# Patient Record
Sex: Male | Born: 1945 | Race: White | Hispanic: No | Marital: Married | Smoking: Former smoker
Health system: Southern US, Community
[De-identification: ages and names within clinical notes are randomized; demographics above are authoritative.]

## PROBLEM LIST (undated history)

## (undated) DIAGNOSIS — I4891 Unspecified atrial fibrillation: Secondary | ICD-10-CM

## (undated) DIAGNOSIS — N200 Calculus of kidney: Secondary | ICD-10-CM

## (undated) DIAGNOSIS — K635 Polyp of colon: Secondary | ICD-10-CM

## (undated) DIAGNOSIS — I10 Essential (primary) hypertension: Secondary | ICD-10-CM

## (undated) DIAGNOSIS — K5790 Diverticulosis of intestine, part unspecified, without perforation or abscess without bleeding: Secondary | ICD-10-CM

## (undated) DIAGNOSIS — E119 Type 2 diabetes mellitus without complications: Secondary | ICD-10-CM

## (undated) DIAGNOSIS — F419 Anxiety disorder, unspecified: Secondary | ICD-10-CM

## (undated) DIAGNOSIS — C679 Malignant neoplasm of bladder, unspecified: Secondary | ICD-10-CM

## (undated) DIAGNOSIS — D638 Anemia in other chronic diseases classified elsewhere: Secondary | ICD-10-CM

## (undated) DIAGNOSIS — I5022 Chronic systolic (congestive) heart failure: Secondary | ICD-10-CM

## (undated) DIAGNOSIS — I639 Cerebral infarction, unspecified: Secondary | ICD-10-CM

## (undated) DIAGNOSIS — M1990 Unspecified osteoarthritis, unspecified site: Secondary | ICD-10-CM

## (undated) DIAGNOSIS — G4733 Obstructive sleep apnea (adult) (pediatric): Secondary | ICD-10-CM

## (undated) DIAGNOSIS — I700 Atherosclerosis of aorta: Secondary | ICD-10-CM

## (undated) DIAGNOSIS — D494 Neoplasm of unspecified behavior of bladder: Secondary | ICD-10-CM

## (undated) DIAGNOSIS — E118 Type 2 diabetes mellitus with unspecified complications: Secondary | ICD-10-CM

## (undated) DIAGNOSIS — R911 Solitary pulmonary nodule: Secondary | ICD-10-CM

## (undated) DIAGNOSIS — J449 Chronic obstructive pulmonary disease, unspecified: Secondary | ICD-10-CM

## (undated) DIAGNOSIS — I6930 Unspecified sequelae of cerebral infarction: Secondary | ICD-10-CM

## (undated) DIAGNOSIS — C4491 Basal cell carcinoma of skin, unspecified: Secondary | ICD-10-CM

## (undated) DIAGNOSIS — M21371 Foot drop, right foot: Secondary | ICD-10-CM

## (undated) DIAGNOSIS — I776 Arteritis, unspecified: Secondary | ICD-10-CM

## (undated) DIAGNOSIS — J479 Bronchiectasis, uncomplicated: Secondary | ICD-10-CM

## (undated) DIAGNOSIS — K219 Gastro-esophageal reflux disease without esophagitis: Secondary | ICD-10-CM

## (undated) DIAGNOSIS — L959 Vasculitis limited to the skin, unspecified: Secondary | ICD-10-CM

## (undated) DIAGNOSIS — Z8701 Personal history of pneumonia (recurrent): Secondary | ICD-10-CM

## (undated) HISTORY — DX: Bronchiectasis, uncomplicated: J47.9

## (undated) HISTORY — DX: Basal cell carcinoma of skin, unspecified: C44.91

## (undated) HISTORY — DX: Chronic obstructive pulmonary disease, unspecified: J44.9

## (undated) HISTORY — DX: Vasculitis limited to the skin, unspecified: L95.9

## (undated) HISTORY — DX: Diverticulosis of intestine, part unspecified, without perforation or abscess without bleeding: K57.90

## (undated) HISTORY — DX: Essential (primary) hypertension: I10

## (undated) HISTORY — DX: Foot drop, right foot: M21.371

## (undated) HISTORY — DX: Unspecified sequelae of cerebral infarction: I69.30

## (undated) HISTORY — DX: Anemia in other chronic diseases classified elsewhere: D63.8

## (undated) HISTORY — DX: Obstructive sleep apnea (adult) (pediatric): G47.33

## (undated) HISTORY — PX: TRANSTHORACIC ECHOCARDIOGRAM: SHX275

## (undated) HISTORY — DX: Arteritis, unspecified: I77.6

## (undated) HISTORY — DX: Atherosclerosis of aorta: I70.0

## (undated) HISTORY — DX: Type 2 diabetes mellitus with unspecified complications: E11.8

## (undated) HISTORY — DX: Polyp of colon: K63.5

## (undated) HISTORY — DX: Type 2 diabetes mellitus without complications: E11.9

## (undated) HISTORY — DX: Unspecified osteoarthritis, unspecified site: M19.90

## (undated) HISTORY — DX: Solitary pulmonary nodule: R91.1

## (undated) HISTORY — DX: Calculus of kidney: N20.0

## (undated) HISTORY — DX: Gastro-esophageal reflux disease without esophagitis: K21.9

## (undated) HISTORY — DX: Malignant neoplasm of bladder, unspecified: C67.9

## (undated) HISTORY — PX: COLONOSCOPY: SHX174

## (undated) HISTORY — PX: SKIN CANCER EXCISION: SHX779

## (undated) HISTORY — DX: Cerebral infarction, unspecified: I63.9

## (undated) HISTORY — DX: Personal history of pneumonia (recurrent): Z87.01

---

## 1994-04-04 ENCOUNTER — Encounter: Payer: Self-pay | Admitting: Pulmonary Disease

## 1994-05-04 ENCOUNTER — Encounter: Payer: Self-pay | Admitting: Pulmonary Disease

## 1994-07-10 HISTORY — PX: TRANSURETHRAL RESECTION OF BLADDER TUMOR: SHX2575

## 1994-07-10 HISTORY — PX: OTHER SURGICAL HISTORY: SHX169

## 2000-09-10 ENCOUNTER — Encounter: Payer: Self-pay | Admitting: Pulmonary Disease

## 2007-09-13 ENCOUNTER — Ambulatory Visit: Payer: Self-pay | Admitting: Pulmonary Disease

## 2007-09-13 ENCOUNTER — Encounter: Payer: Self-pay | Admitting: Pulmonary Disease

## 2007-09-13 DIAGNOSIS — F17200 Nicotine dependence, unspecified, uncomplicated: Secondary | ICD-10-CM | POA: Insufficient documentation

## 2007-09-13 DIAGNOSIS — J309 Allergic rhinitis, unspecified: Secondary | ICD-10-CM | POA: Insufficient documentation

## 2007-09-13 DIAGNOSIS — J441 Chronic obstructive pulmonary disease with (acute) exacerbation: Secondary | ICD-10-CM | POA: Insufficient documentation

## 2007-09-13 DIAGNOSIS — I10 Essential (primary) hypertension: Secondary | ICD-10-CM | POA: Insufficient documentation

## 2007-09-13 DIAGNOSIS — J438 Other emphysema: Secondary | ICD-10-CM | POA: Insufficient documentation

## 2007-09-16 ENCOUNTER — Encounter: Payer: Self-pay | Admitting: Pulmonary Disease

## 2007-10-22 ENCOUNTER — Encounter: Payer: Self-pay | Admitting: Pulmonary Disease

## 2008-01-22 ENCOUNTER — Encounter: Payer: Self-pay | Admitting: Pulmonary Disease

## 2008-03-31 ENCOUNTER — Ambulatory Visit: Payer: Self-pay | Admitting: Pulmonary Disease

## 2008-03-31 DIAGNOSIS — J479 Bronchiectasis, uncomplicated: Secondary | ICD-10-CM | POA: Insufficient documentation

## 2014-02-10 ENCOUNTER — Telehealth: Payer: Self-pay | Admitting: *Deleted

## 2014-02-10 NOTE — Telephone Encounter (Signed)
Erroneous encounter opening

## 2014-07-30 ENCOUNTER — Emergency Department (HOSPITAL_COMMUNITY)
Admission: EM | Admit: 2014-07-30 | Discharge: 2014-07-30 | Disposition: A | Discharge status: Home / Self Care | Payer: Medicare Other | Attending: Emergency Medicine | Admitting: Emergency Medicine

## 2014-07-30 ENCOUNTER — Encounter (HOSPITAL_COMMUNITY): Payer: Self-pay | Admitting: *Deleted

## 2014-07-30 ENCOUNTER — Emergency Department (HOSPITAL_COMMUNITY): Payer: Medicare Other

## 2014-07-30 DIAGNOSIS — R05 Cough: Secondary | ICD-10-CM

## 2014-07-30 DIAGNOSIS — Z86018 Personal history of other benign neoplasm: Secondary | ICD-10-CM | POA: Diagnosis not present

## 2014-07-30 DIAGNOSIS — J441 Chronic obstructive pulmonary disease with (acute) exacerbation: Secondary | ICD-10-CM | POA: Insufficient documentation

## 2014-07-30 DIAGNOSIS — Z87891 Personal history of nicotine dependence: Secondary | ICD-10-CM | POA: Insufficient documentation

## 2014-07-30 DIAGNOSIS — I480 Paroxysmal atrial fibrillation: Secondary | ICD-10-CM | POA: Insufficient documentation

## 2014-07-30 DIAGNOSIS — Z91040 Latex allergy status: Secondary | ICD-10-CM | POA: Insufficient documentation

## 2014-07-30 DIAGNOSIS — Z87442 Personal history of urinary calculi: Secondary | ICD-10-CM | POA: Diagnosis not present

## 2014-07-30 DIAGNOSIS — R059 Cough, unspecified: Secondary | ICD-10-CM

## 2014-07-30 HISTORY — DX: Neoplasm of unspecified behavior of bladder: D49.4

## 2014-07-30 HISTORY — DX: Unspecified atrial fibrillation: I48.91

## 2014-07-30 LAB — COMPREHENSIVE METABOLIC PANEL
ALT: 23 U/L (ref 0–53)
AST: 26 U/L (ref 0–37)
Albumin: 3.6 g/dL (ref 3.5–5.2)
Alkaline Phosphatase: 61 U/L (ref 39–117)
Anion gap: 9 (ref 5–15)
BUN: 15 mg/dL (ref 6–23)
CO2: 27 mmol/L (ref 19–32)
Calcium: 8.8 mg/dL (ref 8.4–10.5)
Chloride: 99 mEq/L (ref 96–112)
Creatinine, Ser: 1.08 mg/dL (ref 0.50–1.35)
GFR calc Af Amer: 78 mL/min — ABNORMAL LOW (ref >90)
GFR calc non Af Amer: 68 mL/min — ABNORMAL LOW (ref >90)
Glucose, Bld: 119 mg/dL — ABNORMAL HIGH (ref 70–99)
Potassium: 3.5 mmol/L (ref 3.5–5.1)
Sodium: 135 mmol/L (ref 135–145)
Total Bilirubin: 0.9 mg/dL (ref 0.3–1.2)
Total Protein: 7.8 g/dL (ref 6.0–8.3)

## 2014-07-30 LAB — CBC WITH DIFFERENTIAL/PLATELET
Basophils Absolute: 0 10*3/uL (ref 0.0–0.1)
Basophils Relative: 0 % (ref 0–1)
Eosinophils Absolute: 0.1 10*3/uL (ref 0.0–0.7)
Eosinophils Relative: 1 % (ref 0–5)
HCT: 43.2 % (ref 39.0–52.0)
Hemoglobin: 14.1 g/dL (ref 13.0–17.0)
Lymphocytes Relative: 11 % — ABNORMAL LOW (ref 12–46)
Lymphs Abs: 1.3 10*3/uL (ref 0.7–4.0)
MCH: 30.5 pg (ref 26.0–34.0)
MCHC: 32.6 g/dL (ref 30.0–36.0)
MCV: 93.5 fL (ref 78.0–100.0)
Monocytes Absolute: 1.4 10*3/uL — ABNORMAL HIGH (ref 0.1–1.0)
Monocytes Relative: 12 % (ref 3–12)
Neutro Abs: 8.9 10*3/uL — ABNORMAL HIGH (ref 1.7–7.7)
Neutrophils Relative %: 76 % (ref 43–77)
Platelets: 253 10*3/uL (ref 150–400)
RBC: 4.62 MIL/uL (ref 4.22–5.81)
RDW: 13.2 % (ref 11.5–15.5)
WBC: 11.7 10*3/uL — ABNORMAL HIGH (ref 4.0–10.5)

## 2014-07-30 MED ORDER — IPRATROPIUM-ALBUTEROL 0.5-2.5 (3) MG/3ML IN SOLN
3.0000 mL | Freq: Once | RESPIRATORY_TRACT | Status: AC
  Administered 2014-07-30: 3 mL via RESPIRATORY_TRACT
  Filled 2014-07-30: qty 3

## 2014-07-30 MED ORDER — PREDNISONE 50 MG PO TABS
60.0000 mg | ORAL_TABLET | Freq: Once | ORAL | Status: AC
  Administered 2014-07-30: 60 mg via ORAL
  Filled 2014-07-30 (×2): qty 1

## 2014-07-30 MED ORDER — LEVOFLOXACIN 500 MG PO TABS
500.0000 mg | ORAL_TABLET | Freq: Once | ORAL | Status: AC
  Administered 2014-07-30: 500 mg via ORAL
  Filled 2014-07-30: qty 1

## 2014-07-30 MED ORDER — PREDNISONE 20 MG PO TABS: 40.0000 mg | ORAL_TABLET | Freq: Every day | ORAL | Status: DC

## 2014-07-30 MED ORDER — ALBUTEROL SULFATE (2.5 MG/3ML) 0.083% IN NEBU
2.5000 mg | INHALATION_SOLUTION | Freq: Once | RESPIRATORY_TRACT | Status: AC
  Administered 2014-07-30: 2.5 mg via RESPIRATORY_TRACT
  Filled 2014-07-30: qty 3

## 2014-07-30 MED ORDER — LEVOFLOXACIN 500 MG PO TABS: 500.0000 mg | ORAL_TABLET | Freq: Every day | ORAL | Status: DC

## 2014-07-30 MED ORDER — LEVALBUTEROL HCL 0.63 MG/3ML IN NEBU
0.6300 mg | INHALATION_SOLUTION | Freq: Once | RESPIRATORY_TRACT | Status: AC
  Administered 2014-07-30: 0.63 mg via RESPIRATORY_TRACT
  Filled 2014-07-30: qty 3

## 2014-07-30 NOTE — ED Provider Notes (Signed)
CSN: [id number]     Arrival date & time 07/30/14  1414 History   First MD Initiated Contact with Patient 07/30/14 1419     Chief Complaint  Patient presents with  . Cough     Patient is a 69 y.o. male presenting with cough. The history is provided by the patient.  Cough Severity:  Moderate Onset quality:  Gradual Duration:  4 days Timing:  Intermittent Progression:  Worsening Chronicity:  New Relieved by:  Nothing Worsened by:  Nothing tried Associated symptoms: shortness of breath and wheezing   Associated symptoms: no chest pain and no fever   Patient reports cough/congestion/SOB for past 4 days He reports h/o COPD that is well controlled He denies hemoptysis No CP is reported   H/o atrial fibrillation - only takes ASA  Past Medical History  Diagnosis Date  . Atrial fibrillation   . Bladder tumor   . Kidney stone    History reviewed. No pertinent past surgical history. History reviewed. No pertinent family history. History  Substance Use Topics  . Smoking status: Former Research scientist (life sciences)  . Smokeless tobacco: Not on file  . Alcohol Use: No    Review of Systems  Constitutional: Negative for fever.  Respiratory: Positive for cough, shortness of breath and wheezing.   Cardiovascular: Negative for chest pain and leg swelling.  Gastrointestinal: Negative for vomiting.  All other systems reviewed and are negative.     Allergies  Augmentin; Ciprofloxacin; Corticosteroids; and Latex  Home Medications   Prior to Admission medications   Not on File   BP 156/88 mmHg  Pulse 99  Temp(Src) 98.1 F (36.7 C) (Oral)  Resp 28  Ht 5' 11.5" (1.816 m)  Wt 186 lb (84.369 kg)  BMI 25.58 kg/m2  SpO2 95% Physical Exam CONSTITUTIONAL: Well developed/well nourished HEAD: Normocephalic/atraumatic EYES: EOMI/PERRL, scleral icterus (pt reports chronic) ENMT: Mucous membranes moist NECK: supple no meningeal signs SPINE/BACK:entire spine nontender CV: S1/S2 noted, no  murmurs/rubs/gallops noted LUNGS: wheezing bilaterally ABDOMEN: soft, nontender, no rebound or guarding, bowel sounds noted throughout abdomen GU:no cva tenderness NEURO: Pt is awake/alert/appropriate, moves all extremitiesx4.   EXTREMITIES: pulses normal/equal, full ROM SKIN: warm, color normal PSYCH: no abnormalities of mood noted, alert and oriented to situation  ED Course  Procedures   4:16 PM Pt still with wheeze and now has worsening atrial fibrillation Will give a round of xopenex Signed out to dr Wilson Singer - pt will need reassessment after nebs.  If improved and is discharged would benefit from prednisone as well antibiotic (azithromycin due to other allergies) Labs Review Labs Reviewed  CBC WITH DIFFERENTIAL - Abnormal; Notable for the following:    WBC 11.7 (*)    Neutro Abs 8.9 (*)    Lymphocytes Relative 11 (*)    Monocytes Absolute 1.4 (*)    All other components within normal limits  COMPREHENSIVE METABOLIC PANEL - Abnormal; Notable for the following:    Glucose, Bld 119 (*)    GFR calc non Af Amer 68 (*)    GFR calc Af Amer 78 (*)    All other components within normal limits    Imaging Review Dg Chest Portable 1 View  07/30/2014   CLINICAL DATA:  Productive cough for 10 days.  Shortness of breath.  EXAM: PORTABLE CHEST - 1 VIEW  COMPARISON:  PA and lateral chest 06/18/2011.  FINDINGS: Lung volumes are lower than on the comparison study with some crowding of the bronchovascular structures. The lungs appear clear. Heart size  is normal. No pneumothorax or pleural effusion.  IMPRESSION: No acute disease.   Electronically Signed   By: Inge Rise M.D.   On: 07/30/2014 15:35     EKG Interpretation   Date/Time:  Thursday July 30 2014 14:46:48 EST Ventricular Rate:  96 PR Interval:    QRS Duration: 128 QT Interval:  370 QTC Calculation: 468 R Axis:   -105 Text Interpretation:  Atrial fibrillation Nonspecific IVCD with LAD  Anterior infarct, old No previous  ECGs available Confirmed by Christy Gentles   MD, Shadow Stiggers ([id number]) on 07/30/2014 3:15:02 PM     Medications  ipratropium-albuterol (DUONEB) 0.5-2.5 (3) MG/3ML nebulizer solution 3 mL (3 mLs Nebulization Given 07/30/14 1457)  albuterol (PROVENTIL) (2.5 MG/3ML) 0.083% nebulizer solution 2.5 mg (2.5 mg Nebulization Given 07/30/14 1457)  predniSONE (DELTASONE) tablet 60 mg (60 mg Oral Given 07/30/14 1458)    MDM   Final diagnoses:  Cough  Chronic obstructive pulmonary disease with acute exacerbation  Paroxysmal atrial fibrillation    Nursing notes including past medical history and social history reviewed and considered in documentation xrays/imaging reviewed by myself and considered during evaluation - discussed imaging results with radiologist - no acute change from prior Labs/vital reviewed myself and considered during evaluation     Sharyon Cable, MD 07/30/14 1617

## 2014-07-30 NOTE — Discharge Instructions (Signed)
Chronic Obstructive Pulmonary Disease Exacerbation ° Chronic obstructive pulmonary disease (COPD) is a common lung problem. In COPD, the flow of air from the lungs is limited. COPD exacerbations are times that breathing gets worse and you need extra treatment. Without treatment they can be life threatening. If they happen often, your lungs can become more damaged. °HOME CARE °· Do not smoke. °· Avoid tobacco smoke and other things that bother your lungs. °· If given, take your antibiotic medicine as told. Finish the medicine even if you start to feel better. °· Only take medicines as told by your doctor. °· Drink enough fluids to keep your pee (urine) clear or pale yellow (unless your doctor has told you not to). °· Use a cool mist machine (vaporizer). °· If you use oxygen or a machine that turns liquid medicine into a mist (nebulizer), continue to use them as told. °· Keep up with shots (vaccinations) as told by your doctor. °· Exercise regularly. °· Eat healthy foods. °· Keep all doctor visits as told. °GET HELP RIGHT AWAY IF: °· You are very short of breath and it gets worse. °· You have trouble talking. °· You have bad chest pain. °· You have blood in your spit (sputum). °· You have a fever. °· You keep throwing up (vomiting). °· You feel weak, or you pass out (faint). °· You feel confused. °· You keep getting worse. °MAKE SURE YOU:  °· Understand these instructions. °· Will watch your condition. °· Will get help right away if you are not doing well or get worse. °Document Released: 06/15/2011 Document Revised: 04/16/2013 Document Reviewed: 02/28/2013 °ExitCare® Patient Information ©2015 [employer]. This information is not intended to replace advice given to you by your health care provider. Make sure you discuss any questions you have with your health care provider. ° °

## 2014-07-30 NOTE — ED Notes (Signed)
Pt with cough -productive of yellow mucus for a week, diarrhea the other day but denies today, denies N/V, states coughing spells at times cause him to lose his breath, denies pain at this time

## 2014-08-03 ENCOUNTER — Encounter: Payer: Self-pay | Admitting: Pulmonary Disease

## 2014-08-03 ENCOUNTER — Ambulatory Visit (INDEPENDENT_AMBULATORY_CARE_PROVIDER_SITE_OTHER): Payer: Medicare Other | Admitting: Pulmonary Disease

## 2014-08-03 VITALS — BP 90/58 | HR 87 | Ht 72.0 in | Wt 189.4 lb

## 2014-08-03 DIAGNOSIS — J438 Other emphysema: Secondary | ICD-10-CM

## 2014-08-03 DIAGNOSIS — J441 Chronic obstructive pulmonary disease with (acute) exacerbation: Secondary | ICD-10-CM

## 2014-08-03 MED ORDER — IPRATROPIUM BROMIDE 0.02 % IN SOLN: 0.5000 mg | Freq: Four times a day (QID) | RESPIRATORY_TRACT | Status: DC

## 2014-08-03 MED ORDER — ALBUTEROL SULFATE HFA 108 (90 BASE) MCG/ACT IN AERS: 2.0000 | INHALATION_SPRAY | Freq: Four times a day (QID) | RESPIRATORY_TRACT | Status: DC | PRN

## 2014-08-03 NOTE — Progress Notes (Signed)
Subjective:    Patient ID: Matthew Hall, male    DOB: [birth year], 69 y.o.   MRN: [id number]  HPI  61/M, ex smoker with moderate COPD, on spiriva since 3/09, seems to help. 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  Episode of bronchitis 10/2007 with abnormal CXR, CT showed >> RLL & lingular bronchectasis & areas of pleuro-parenchymal scarring. 8 mm low density lesion in liver >> stable on rpt CT7/2009 (I have reviewed these films). Incidentally, not seen on CT in 1995. FEV1 trend '95 59% >> 64% in 09/2007 >> 59% in 03/2008   Last seen 2009 Chief Complaint  Patient presents with  . Advice Only    Referred by Dr. Christy Gentles; COPD diagnosed 20 years ago. Snoring a lot at night, thinks it may be because of congestion build up in his chest when he lays down.     Seen at [facility] 1/21 for 'congestion & wheezing' >> improved with nebs x 2, given levaquin & prednisone, back to baseline now, denies DOE when feeling well He smoked about 20 Pyrs, then quit & restarted, finally quit in 2012  Past Medical History  Diagnosis Date  . Atrial fibrillation   . Bladder tumor   . Kidney stone   . COPD (chronic obstructive pulmonary disease)     Past Surgical History  Procedure Laterality Date  . Bladder tumor removed    . Kidney stones removed      Allergies  Allergen Reactions  . Ciprofloxacin   . Augmentin [Amoxicillin-Pot Clavulanate] Rash  . Corticosteroids Rash  . Latex Hives and Rash    History   Social History  . Marital Status: Married    Spouse Name: N/A    Number of Children: 3  . Years of Education: N/A   Occupational History  . Not on file.   Social History Main Topics  . Smoking status: Former Smoker -- 1.00 packs/day for 30 years    Types: Cigarettes  . Smokeless tobacco: Never Used  . Alcohol Use: No  . Drug Use: No  . Sexual Activity: Not on file   Other Topics  Concern  . Not on file   Social History Narrative    Family History  Problem Relation Age of Onset  . Emphysema Father   . Cancer Brother     lung, liver  . Diabetes Mother   . Diabetes Sister      Review of Systems  Constitutional: Negative for fever and unexpected weight change.  HENT: Positive for congestion, dental problem, ear pain and postnasal drip. Negative for nosebleeds, rhinorrhea, sinus pressure, sneezing, sore throat and trouble swallowing.   Eyes: Positive for redness. Negative for itching.  Respiratory: Positive for cough and wheezing. Negative for chest tightness and shortness of breath.   Cardiovascular: Negative for palpitations and leg swelling.  Gastrointestinal: Negative for nausea and vomiting.  Genitourinary: Negative for dysuria.  Musculoskeletal: Negative for joint swelling.  Skin: Negative for rash.  Neurological: Negative for headaches.  Hematological: Does not bruise/bleed easily.  Psychiatric/Behavioral: Negative for dysphoric mood. The patient is not nervous/anxious.        Objective:   Physical Exam  Gen. Pleasant, well-nourished, in no distress ENT - no lesions, no post nasal drip Neck: No JVD, no thyromegaly, no carotid bruits Lungs: no use of accessory muscles, no dullness to percussion, rales on left, no rhonchi  Cardiovascular: Rhythm  regular, heart sounds  normal, no murmurs or gallops, no peripheral edema Musculoskeletal: No deformities, no cyanosis or clubbing        Assessment & Plan:

## 2014-08-03 NOTE — Assessment & Plan Note (Signed)
Complete course on levaquin & prednisone

## 2014-08-03 NOTE — Addendum Note (Signed)
Addended by: Mathis Dad on: 08/03/2014 03:52 PM   Modules accepted: Orders

## 2014-08-03 NOTE — Addendum Note (Signed)
Addended by: Mathis Dad on: 08/03/2014 03:31 PM   Modules accepted: Orders

## 2014-08-03 NOTE — Assessment & Plan Note (Signed)
We will refer you to pulm rehab [facility] for albuterol MDI 2 puffs every 6h as needed Refill on atrovent neb #15

## 2014-08-03 NOTE — Patient Instructions (Signed)
Complete course on levaquin & prednisone We will refer you to pulm rehab Rx for albuterol MDI 2 puffs every 6h as needed Refill on atrovent neb #15

## 2014-08-06 ENCOUNTER — Encounter (HOSPITAL_COMMUNITY)
Admission: RE | Admit: 2014-08-06 | Discharge: 2014-08-06 | Disposition: A | Discharge status: Home / Self Care | Payer: Medicare Other | Source: Ambulatory Visit | Attending: Pulmonary Disease | Admitting: Pulmonary Disease

## 2014-08-06 ENCOUNTER — Encounter (HOSPITAL_COMMUNITY): Payer: Self-pay

## 2014-08-06 VITALS — BP 118/70 | HR 73 | Resp 20 | Ht 71.0 in | Wt 186.7 lb

## 2014-08-06 DIAGNOSIS — J439 Emphysema, unspecified: Secondary | ICD-10-CM | POA: Diagnosis present

## 2014-08-06 DIAGNOSIS — J438 Other emphysema: Secondary | ICD-10-CM

## 2014-08-06 NOTE — Progress Notes (Signed)
Patient referred to Cardiac Rehab by Dr. 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  Dr. Elsworth Soho is his Pulmonologist and Dr. Macarthur Critchley is his PCP.  During orientation advised patient on arrival and appointment times what to wear, what to do before, during and after exercise.  Reviewed attendance and class policy.  Talked about inclement weather and class consultation policy. Patient is scheduled to start cardiac Rehab on Thursday, August 13, 2014 at 10:45am.  Patient was advised to come to class 5 minutes before class starts.  He was also given instructions on meeting with the dietician and attending the Family Structure classes. Pt is eager to get started.  Pt completed 6 minute walk test.

## 2014-08-06 NOTE — Progress Notes (Signed)
Pt has finished orientation and is scheduled to start CR on Thursday, August 13, 2014 at 10:45am.  Pt has been instructed to arrive to class 15 minutes early for scheduled class. Pt has been instructed to wear comfortable clothing and shoes with rubber soles. Pt has been told to take their medications 1 hour prior to coming to class.  If the patient is not going to attend class, he/she has been instructed to call.

## 2014-08-11 ENCOUNTER — Telehealth: Payer: Self-pay | Admitting: Pulmonary Disease

## 2014-08-11 DIAGNOSIS — J438 Other emphysema: Secondary | ICD-10-CM

## 2014-08-11 NOTE — Telephone Encounter (Signed)
PA initiated via [email or web address] Key: [id number]  Determination within 5 business days Plan contact # [phone]  Will send to [facility] to follow up on.

## 2014-08-12 NOTE — Telephone Encounter (Signed)
Spoke with Selina Cooley, pharmacist with [facility].  She states that Ipatropium will be approved if filed through pt's medicare part B.  [facility]ed [facility] in [place] and let them know to file under pt's part B.  They stated per medicare guidelines pt will need to come to pharmacy to sign a form to have this filled. [place] x 1  - Let pt know he needs to go by pharmacy to sign paper to have Ipatropium filled.

## 2014-08-13 ENCOUNTER — Encounter (HOSPITAL_COMMUNITY)
Admission: RE | Admit: 2014-08-13 | Discharge: 2014-08-13 | Disposition: A | Discharge status: Home / Self Care | Payer: Medicare Other | Source: Ambulatory Visit | Attending: Pulmonary Disease | Admitting: Pulmonary Disease

## 2014-08-13 DIAGNOSIS — J439 Emphysema, unspecified: Secondary | ICD-10-CM | POA: Diagnosis not present

## 2014-08-17 NOTE — Telephone Encounter (Signed)
Received letter from [facility], the appeal was denied for Ipratropium.  In Dr. Bari Mantis box.  To Dr. Elsworth Soho, what would you recommend as replacement for Ipratropium?

## 2014-08-17 NOTE — Telephone Encounter (Signed)
Please send a prescription to DME-they will be able to get this covered more easily.

## 2014-08-17 NOTE — Telephone Encounter (Signed)
Spoke with patient, pt is very frustrated with [facility]. States that Rx has been denied and when he went by there to sign the form that he was advised to go sign they knew nothing of it.  [facility] and spoke with [facility], requested the medication to be ran through Medicare Part B - insurance denied stating that he is ineligible for Medicare part B coverage. This medication requires a PA be completed. ---- Called number below to follow up on PA already initiated on 08/11/14 Spoke with Del Dios, [place] [phone] --- Key: Karena Addison states that the PA was cancelled for some reason and this will need to be initiated again.  Gave number to the PA team [phone] to contact to initiate PA. I was advised by multiple people that this medication should be covered under Medicare Part B- I informed them that it is not.  On phone for over 35mins and was transferred to multiple representatives and was advised by each that they could not help me as I was transferred to the wrong dept.  I expressed my frustration and asked that someone please get a PA representative on the phone to assist me.  Call transferred again and I am then advised that person speaking with cannot assist me and will have to fax me a form for MD to fill out.  Triage number given. Will await fax to complete PA.

## 2014-08-17 NOTE — Telephone Encounter (Signed)
Pt calling back on this PA he states he went to the pharmacy  And that he has to have a PA   [phone] this is the pts #

## 2014-08-18 ENCOUNTER — Encounter (HOSPITAL_COMMUNITY)
Admission: RE | Admit: 2014-08-18 | Discharge: 2014-08-18 | Disposition: A | Discharge status: Home / Self Care | Payer: Medicare Other | Source: Ambulatory Visit | Attending: Pulmonary Disease | Admitting: Pulmonary Disease

## 2014-08-18 DIAGNOSIS — J439 Emphysema, unspecified: Secondary | ICD-10-CM | POA: Diagnosis not present

## 2014-08-18 MED ORDER — IPRATROPIUM BROMIDE 0.02 % IN SOLN: 0.5000 mg | Freq: Four times a day (QID) | RESPIRATORY_TRACT | Status: DC

## 2014-08-18 NOTE — Addendum Note (Signed)
Addended by: Virl Cagey on: 08/18/2014 04:47 PM   Modules accepted: Orders

## 2014-08-18 NOTE — Telephone Encounter (Signed)
Spoke with pt, aware that insurance is not covering nebulizer medication Ipratropium .02% Soln. Per Dr Elsworth Soho we are going to work with a DME to see if we can get coverage. Pt okay with this.  Order placed for nebulizer medication through DME ([facility] or [facility]).  Order has been expedited. [facility] aware that order needs to be expedited(spoke with [place]).

## 2014-08-18 NOTE — Telephone Encounter (Signed)
LM for pt to return call - need to make aware of denial of medication. Need to know what DME they use so that we can contact DME to check coverage of this medication through their pharmacy.

## 2014-08-18 NOTE — Telephone Encounter (Signed)
Pt returning call.Matthew Hall ° °

## 2014-08-18 NOTE — Telephone Encounter (Signed)
Order sent to [facility] for neb meds Joellen [place]

## 2014-08-20 ENCOUNTER — Encounter (HOSPITAL_COMMUNITY)
Admission: RE | Admit: 2014-08-20 | Discharge: 2014-08-20 | Disposition: A | Discharge status: Home / Self Care | Payer: Medicare Other | Source: Ambulatory Visit | Attending: Pulmonary Disease | Admitting: Pulmonary Disease

## 2014-08-20 DIAGNOSIS — J439 Emphysema, unspecified: Secondary | ICD-10-CM | POA: Diagnosis not present

## 2014-08-20 NOTE — Progress Notes (Signed)
Pulmonary Rehabilitation Program Outcomes Report   Orientation:  08/06/14 Graduate Date:  tbd   Discharge Date:  tbd # of sessions completed: 3  Pulmonologist: Dr. Elsworth Soho Family MD:  Dr. Myrtie Cruise Time:  0347  A.  Exercise Program:  Tolerates exercise @ 3.57 METS for 30 minutes  B.  Mental Health:  Good mental attitude  C.  Education/Instruction/Skills  Knows THR for exercise and Uses Perceived Exertion Scale and/or Dyspnea Scale  Demonstrates accurate pursed lip breathing  D.  Nutrition/Weight Control/Body Composition:  Adherence to prescribed nutrition program: good    E.  Blood Lipids   No results found for: CHOL, HDL, LDLCALC, LDLDIRECT, TRIG, CHOLHDL  F.  Lifestyle Changes:  Making positive lifestyle changes and Not smoking:  Quit 2012  G.  Symptoms noted with exercise:  Asymptomatic  Report Completed By:  Felicity Coyer, RN   Comments:  First week note.

## 2014-08-25 ENCOUNTER — Encounter (HOSPITAL_COMMUNITY): Payer: Medicare Other

## 2014-08-27 ENCOUNTER — Encounter (HOSPITAL_COMMUNITY)
Admission: RE | Admit: 2014-08-27 | Discharge: 2014-08-27 | Disposition: A | Discharge status: Home / Self Care | Payer: Medicare Other | Source: Ambulatory Visit | Attending: Pulmonary Disease | Admitting: Pulmonary Disease

## 2014-08-27 DIAGNOSIS — J439 Emphysema, unspecified: Secondary | ICD-10-CM | POA: Diagnosis not present

## 2014-09-01 ENCOUNTER — Encounter (HOSPITAL_COMMUNITY)
Admission: RE | Admit: 2014-09-01 | Discharge: 2014-09-01 | Disposition: A | Discharge status: Home / Self Care | Payer: Medicare Other | Source: Ambulatory Visit | Attending: Pulmonary Disease | Admitting: Pulmonary Disease

## 2014-09-01 DIAGNOSIS — J439 Emphysema, unspecified: Secondary | ICD-10-CM | POA: Diagnosis not present

## 2014-09-03 ENCOUNTER — Encounter (HOSPITAL_COMMUNITY)
Admission: RE | Admit: 2014-09-03 | Discharge: 2014-09-03 | Disposition: A | Discharge status: Home / Self Care | Payer: Medicare Other | Source: Ambulatory Visit | Attending: Pulmonary Disease | Admitting: Pulmonary Disease

## 2014-09-03 DIAGNOSIS — J439 Emphysema, unspecified: Secondary | ICD-10-CM | POA: Diagnosis not present

## 2014-09-08 ENCOUNTER — Encounter (HOSPITAL_COMMUNITY)
Admission: RE | Admit: 2014-09-08 | Discharge: 2014-09-08 | Disposition: A | Discharge status: Home / Self Care | Payer: Medicare Other | Source: Ambulatory Visit | Attending: Pulmonary Disease | Admitting: Pulmonary Disease

## 2014-09-08 DIAGNOSIS — J439 Emphysema, unspecified: Secondary | ICD-10-CM | POA: Diagnosis present

## 2014-09-10 ENCOUNTER — Encounter (HOSPITAL_COMMUNITY)
Admission: RE | Admit: 2014-09-10 | Discharge: 2014-09-10 | Disposition: A | Discharge status: Home / Self Care | Payer: Medicare Other | Source: Ambulatory Visit | Attending: Pulmonary Disease | Admitting: Pulmonary Disease

## 2014-09-10 DIAGNOSIS — J439 Emphysema, unspecified: Secondary | ICD-10-CM | POA: Diagnosis not present

## 2014-09-15 ENCOUNTER — Encounter (HOSPITAL_COMMUNITY)
Admission: RE | Admit: 2014-09-15 | Discharge: 2014-09-15 | Disposition: A | Discharge status: Home / Self Care | Payer: Medicare Other | Source: Ambulatory Visit | Attending: Pulmonary Disease | Admitting: Pulmonary Disease

## 2014-09-15 DIAGNOSIS — J439 Emphysema, unspecified: Secondary | ICD-10-CM | POA: Diagnosis not present

## 2014-09-17 ENCOUNTER — Encounter (HOSPITAL_COMMUNITY): Payer: Medicare Other

## 2014-09-22 ENCOUNTER — Encounter (HOSPITAL_COMMUNITY)
Admission: RE | Admit: 2014-09-22 | Discharge: 2014-09-22 | Disposition: A | Discharge status: Home / Self Care | Payer: Medicare Other | Source: Ambulatory Visit | Attending: Pulmonary Disease | Admitting: Pulmonary Disease

## 2014-09-22 DIAGNOSIS — J439 Emphysema, unspecified: Secondary | ICD-10-CM | POA: Diagnosis not present

## 2014-09-24 ENCOUNTER — Encounter (HOSPITAL_COMMUNITY)
Admission: RE | Admit: 2014-09-24 | Discharge: 2014-09-24 | Disposition: A | Discharge status: Home / Self Care | Payer: Medicare Other | Source: Ambulatory Visit | Attending: Pulmonary Disease | Admitting: Pulmonary Disease

## 2014-09-24 DIAGNOSIS — J439 Emphysema, unspecified: Secondary | ICD-10-CM | POA: Diagnosis not present

## 2014-09-28 ENCOUNTER — Telehealth: Payer: Self-pay | Admitting: Pulmonary Disease

## 2014-09-28 NOTE — Telephone Encounter (Signed)
[phone] pt cb

## 2014-09-28 NOTE — Telephone Encounter (Signed)
Patient calling to check on Neb solution.  Patient has not received any neb medication since the beginning of February.  He has been having to use his son's medication.  He would like to know why [facility] has not contacted him to get him his medication.    Dawn - can you check up on this and let patient know what is going on?

## 2014-09-28 NOTE — Telephone Encounter (Signed)
Left message to call back  

## 2014-09-29 ENCOUNTER — Encounter (HOSPITAL_COMMUNITY): Payer: Medicare Other

## 2014-09-29 NOTE — Telephone Encounter (Signed)
Kim @[facility]  called back stating that [place] in [place], [place] advised pt is [place] and has to go through [facility] in [place] for neb meds.  Called and spoke with Katina Degree @[facility]  [place] [phone]) and she is aware I will be faxing Order for this pt's neb meds to them.  Kim @[facility]  is faxing the neb med Rx back to me.  I have spoken with the pt and he is aware of this change.  I gave him the phone number for [facility] advised that he will need to take his ins card(s) to them so they can make copies for their files Kara Mead

## 2014-09-29 NOTE — Telephone Encounter (Signed)
Spoke with Forde Radon @[facility] .  She is going to check on the status of pt's meds & call me back Kara Mead

## 2014-09-30 ENCOUNTER — Telehealth: Payer: Self-pay | Admitting: Pulmonary Disease

## 2014-09-30 DIAGNOSIS — J438 Other emphysema: Secondary | ICD-10-CM

## 2014-09-30 NOTE — Telephone Encounter (Signed)
Order has been placed.

## 2014-10-01 ENCOUNTER — Encounter (HOSPITAL_COMMUNITY)
Admission: RE | Admit: 2014-10-01 | Discharge: 2014-10-01 | Disposition: A | Discharge status: Home / Self Care | Payer: Medicare Other | Source: Ambulatory Visit | Attending: Pulmonary Disease | Admitting: Pulmonary Disease

## 2014-10-01 DIAGNOSIS — J439 Emphysema, unspecified: Secondary | ICD-10-CM | POA: Diagnosis not present

## 2014-10-01 NOTE — Progress Notes (Signed)
Pulmonary Rehabilitation Program Outcomes Report   Orientation:  08/06/14 Graduate Date:  tbd Discharge Date:  tbd # of sessions completed: 12  Pulmonologist: Dr. Elsworth Soho Family MD:  Dr. Myrtie Cruise Time:  2395  A.  Exercise Program:  Tolerates exercise @ 3.58 METS for 30 minutes  B.  Mental Health:  Good mental attitude  C.  Education/Instruction/Skills  Accurately checks own pulse.  Rest:  74  Exercise:  90, Knows THR for exercise and Uses Perceived Exertion Scale and/or Dyspnea Scale  Demonstrates accurate pursed lip breathing  D.  Nutrition/Weight Control/Body Composition:  Adherence to prescribed nutrition program: good    E.  Blood Lipids   No results found for: CHOL, HDL, LDLCALC, LDLDIRECT, TRIG, CHOLHDL  F.  Lifestyle Changes:  Making positive lifestyle changes  G.  Symptoms noted with exercise:  Asymptomatic  Report Completed By:  Felicity Coyer, RN   Comments:  [place] note.

## 2014-10-05 NOTE — Telephone Encounter (Signed)
Can this message be closed? Thanks.

## 2014-10-06 ENCOUNTER — Encounter (HOSPITAL_COMMUNITY)
Admission: RE | Admit: 2014-10-06 | Discharge: 2014-10-06 | Disposition: A | Discharge status: Home / Self Care | Payer: Medicare Other | Source: Ambulatory Visit | Attending: Pulmonary Disease | Admitting: Pulmonary Disease

## 2014-10-06 DIAGNOSIS — J439 Emphysema, unspecified: Secondary | ICD-10-CM | POA: Diagnosis not present

## 2014-10-08 ENCOUNTER — Encounter (HOSPITAL_COMMUNITY)
Admission: RE | Admit: 2014-10-08 | Discharge: 2014-10-08 | Disposition: A | Discharge status: Home / Self Care | Payer: Medicare Other | Source: Ambulatory Visit | Attending: Pulmonary Disease | Admitting: Pulmonary Disease

## 2014-10-08 DIAGNOSIS — J439 Emphysema, unspecified: Secondary | ICD-10-CM | POA: Diagnosis not present

## 2014-10-13 ENCOUNTER — Encounter (HOSPITAL_COMMUNITY): Payer: Medicare Other

## 2014-10-15 ENCOUNTER — Encounter (HOSPITAL_COMMUNITY)
Admission: RE | Admit: 2014-10-15 | Discharge: 2014-10-15 | Disposition: A | Discharge status: Home / Self Care | Payer: Medicare Other | Source: Ambulatory Visit | Attending: Pulmonary Disease | Admitting: Pulmonary Disease

## 2014-10-15 DIAGNOSIS — J439 Emphysema, unspecified: Secondary | ICD-10-CM | POA: Insufficient documentation

## 2014-10-20 ENCOUNTER — Encounter (HOSPITAL_COMMUNITY): Payer: Medicare Other

## 2014-10-20 ENCOUNTER — Encounter (HOSPITAL_COMMUNITY)
Admission: RE | Admit: 2014-10-20 | Discharge: 2014-10-20 | Disposition: A | Discharge status: Home / Self Care | Payer: Medicare Other | Source: Ambulatory Visit | Attending: Pulmonary Disease | Admitting: Pulmonary Disease

## 2014-10-20 DIAGNOSIS — J439 Emphysema, unspecified: Secondary | ICD-10-CM | POA: Diagnosis not present

## 2014-10-22 ENCOUNTER — Encounter (HOSPITAL_COMMUNITY)
Admission: RE | Admit: 2014-10-22 | Discharge: 2014-10-22 | Disposition: A | Discharge status: Home / Self Care | Payer: Medicare Other | Source: Ambulatory Visit | Attending: Pulmonary Disease | Admitting: Pulmonary Disease

## 2014-10-22 DIAGNOSIS — J439 Emphysema, unspecified: Secondary | ICD-10-CM | POA: Diagnosis not present

## 2014-10-27 ENCOUNTER — Encounter (HOSPITAL_COMMUNITY)
Admission: RE | Admit: 2014-10-27 | Discharge: 2014-10-27 | Disposition: A | Discharge status: Home / Self Care | Payer: Medicare Other | Source: Ambulatory Visit | Attending: Pulmonary Disease | Admitting: Pulmonary Disease

## 2014-10-27 DIAGNOSIS — J439 Emphysema, unspecified: Secondary | ICD-10-CM | POA: Diagnosis not present

## 2014-10-29 ENCOUNTER — Encounter (HOSPITAL_COMMUNITY)
Admission: RE | Admit: 2014-10-29 | Discharge: 2014-10-29 | Disposition: A | Discharge status: Home / Self Care | Payer: Medicare Other | Source: Ambulatory Visit | Attending: Pulmonary Disease | Admitting: Pulmonary Disease

## 2014-10-29 DIAGNOSIS — J439 Emphysema, unspecified: Secondary | ICD-10-CM | POA: Diagnosis not present

## 2014-11-03 ENCOUNTER — Encounter (HOSPITAL_COMMUNITY)
Admission: RE | Admit: 2014-11-03 | Discharge: 2014-11-03 | Disposition: A | Discharge status: Home / Self Care | Payer: Medicare Other | Source: Ambulatory Visit | Attending: Pulmonary Disease | Admitting: Pulmonary Disease

## 2014-11-03 DIAGNOSIS — J439 Emphysema, unspecified: Secondary | ICD-10-CM | POA: Diagnosis not present

## 2014-11-05 ENCOUNTER — Encounter (HOSPITAL_COMMUNITY)
Admission: RE | Admit: 2014-11-05 | Discharge: 2014-11-05 | Disposition: A | Discharge status: Home / Self Care | Payer: Medicare Other | Source: Ambulatory Visit | Attending: Pulmonary Disease | Admitting: Pulmonary Disease

## 2014-11-05 DIAGNOSIS — J439 Emphysema, unspecified: Secondary | ICD-10-CM | POA: Diagnosis not present

## 2014-11-10 ENCOUNTER — Encounter (HOSPITAL_COMMUNITY)
Admission: RE | Admit: 2014-11-10 | Discharge: 2014-11-10 | Disposition: A | Discharge status: Home / Self Care | Payer: Medicare Other | Source: Ambulatory Visit | Attending: Pulmonary Disease | Admitting: Pulmonary Disease

## 2014-11-10 DIAGNOSIS — J439 Emphysema, unspecified: Secondary | ICD-10-CM | POA: Diagnosis present

## 2014-11-12 ENCOUNTER — Encounter (HOSPITAL_COMMUNITY)
Admission: RE | Admit: 2014-11-12 | Discharge: 2014-11-12 | Disposition: A | Discharge status: Home / Self Care | Payer: Medicare Other | Source: Ambulatory Visit | Attending: Pulmonary Disease | Admitting: Pulmonary Disease

## 2014-11-12 DIAGNOSIS — J439 Emphysema, unspecified: Secondary | ICD-10-CM | POA: Diagnosis not present

## 2014-11-16 ENCOUNTER — Encounter (INDEPENDENT_AMBULATORY_CARE_PROVIDER_SITE_OTHER): Payer: Self-pay

## 2014-11-16 ENCOUNTER — Encounter: Payer: Self-pay | Admitting: Pulmonary Disease

## 2014-11-16 ENCOUNTER — Ambulatory Visit (INDEPENDENT_AMBULATORY_CARE_PROVIDER_SITE_OTHER): Payer: Medicare Other | Admitting: Pulmonary Disease

## 2014-11-16 VITALS — BP 118/80 | HR 72 | Ht 72.0 in | Wt 189.0 lb

## 2014-11-16 DIAGNOSIS — J441 Chronic obstructive pulmonary disease with (acute) exacerbation: Secondary | ICD-10-CM

## 2014-11-16 DIAGNOSIS — R0602 Shortness of breath: Secondary | ICD-10-CM

## 2014-11-16 NOTE — Assessment & Plan Note (Signed)
Lung function has dropped to 40% Trial of symbicort 160 - 2 puffs twice daily - Rinse mouth after use -call for Rx if this works Decrease atrovent nebs to twice daily - OK t use 3rd time if needed Consider adding spiriva in the future

## 2014-11-16 NOTE — Patient Instructions (Addendum)
Lung function has dropped to 40% Trial of symbicort 160 - 2 puffs twice daily - Rinse mouth after use -call for Rx if this works Decrease atrovent nebs to twice daily - OK t use 3rd time if needed

## 2014-11-16 NOTE — Progress Notes (Signed)
   Subjective:    Patient ID: Matthew Hall, male    DOB: [birth year], 69 y.o.   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  Significant tests/ events  Episode of bronchitis 10/2007 with abnormal CXR, CT showed >> RLL & lingular bronchectasis & areas of pleuro-parenchymal scarring. 8 mm low density lesion in liver >> stable on rpt CT7/2009. Incidentally, not seen on CT in 1995.  FEV1 trend '95 59% >> 64% in 09/2007 >> 59% in 03/2008   11/16/2014  Chief Complaint  Patient presents with  . Follow-up    patient feeling better with pulmonary rehab.  patient using neb treatments and doing well.  CAT score: 7   09/2014 neb provided  Spirometry >> FEv1 40%, ratio 62  Last COPD flare in 07/2014 back to baseline now, denies DOE when feeling well Stable to participate in pulmonary rehabilitation Using Atrovent nebs 4 times a day   Review of Systems  neg for any significant sore throat, dysphagia, itching, sneezing, nasal congestion or excess/ purulent secretions, fever, chills, sweats, unintended wt loss, pleuritic or exertional cp, hempoptysis, orthopnea pnd or change in chronic leg swelling. Also denies presyncope, palpitations, heartburn, abdominal pain, nausea, vomiting, diarrhea or change in bowel or urinary habits, dysuria,hematuria, rash, arthralgias, visual complaints, headache, numbness weakness or ataxia.      Objective:   Physical Exam  Gen. Pleasant, well-nourished, in no distress ENT - no lesions, no post nasal drip Neck: No JVD, no thyromegaly, no carotid bruits Lungs: no use of accessory muscles, no dullness to percussion, decreased BL with RLL rales, no rhonchi  Cardiovascular: Rhythm regular, heart sounds  normal, no murmurs or gallops, no  peripheral edema Musculoskeletal: No deformities, no cyanosis or clubbing        Assessment & Plan:

## 2014-11-17 ENCOUNTER — Encounter (HOSPITAL_COMMUNITY)
Admission: RE | Admit: 2014-11-17 | Discharge: 2014-11-17 | Disposition: A | Discharge status: Home / Self Care | Payer: Medicare Other | Source: Ambulatory Visit | Attending: Pulmonary Disease | Admitting: Pulmonary Disease

## 2014-11-17 DIAGNOSIS — J439 Emphysema, unspecified: Secondary | ICD-10-CM | POA: Diagnosis not present

## 2014-11-17 NOTE — Progress Notes (Signed)
Pulmonary Rehab Program Outcomes Report   Orientation:  08/06/14 Graduate Date:  11/17/14 Discharge Date:  11/17/14 # of sessions completed: 24  Pulmonologist: Elsworth Soho Family MD:  Macarthur Critchley Class Time:  1045am  A.  Exercise Program:  Tolerates exercise @ 3.58 METS for 15 minutes, Walk Test Results:  Post: 3.18 mets, Improved functional capacity  4.95 %, Improved  muscular strength  4.24 %, Needs encouragement on exercise program and Progressed to Phase 4 maintenance program  B.  Mental Health:  Good mental attitude  C.  Education/Instruction/Skills  Accurately checks own pulse.  Rest:  64  Exercise:  94, Knows THR for exercise, Uses Perceived Exertion Scale and/or Dyspnea Scale and Attended 13 education classes  Anticipated home compliance with respiratory program:  good  D.  Nutrition/Weight Control/Body Composition:  Adherence to prescribed nutrition program: fair    E.  Blood Lipids   No results found for: CHOL, HDL, LDLCALC, LDLDIRECT, TRIG, CHOLHDL  F.  Lifestyle Changes:  Making positive lifestyle changes  G.  Symptoms noted with exercise:  Asymptomatic  Report Completed By:  Stevphen Rochester RN   Comments:  Patient has graduated [facility] today. Patient decided to join maintenance asap. Patient has done very well in program.

## 2014-11-17 NOTE — Progress Notes (Signed)
.  Patient is discharged from [facility] and Pulmonary program today, Nov 17, 2014 with 24 sessions.  He achieved LTG of 30 minutes of aerobic exercise at max met level of 3.58.  All patient vitals are WNL.  Patient has met with dietician.  Discharge instructions have been reviewed in detail and patient expressed an understanding of material given.  Patient plans to join the maintenance program. Cardiac Rehab will make 1 month, 6 month and 1 year call backs.  Patient had no complaints of any abnormal S/S or pain on their exit visit.  Patient finished post walk test.

## 2014-11-30 ENCOUNTER — Telehealth: Payer: Self-pay | Admitting: Pulmonary Disease

## 2014-11-30 NOTE — Telephone Encounter (Signed)
Attempted to call pt. No answer, no option to leave a message. Will try back. 

## 2014-12-01 MED ORDER — BUDESONIDE-FORMOTEROL FUMARATE 160-4.5 MCG/ACT IN AERO: 2.0000 | INHALATION_SPRAY | Freq: Two times a day (BID) | RESPIRATORY_TRACT | Status: DC

## 2014-12-01 NOTE — Telephone Encounter (Signed)
Spoke with pt and feels like sample of symbicort that we gave him at last ov has helped.  Rx sent to pharmacy.

## 2014-12-06 ENCOUNTER — Encounter (HOSPITAL_COMMUNITY): Payer: Self-pay | Admitting: *Deleted

## 2014-12-06 DIAGNOSIS — Z8551 Personal history of malignant neoplasm of bladder: Secondary | ICD-10-CM | POA: Diagnosis not present

## 2014-12-06 DIAGNOSIS — Z85828 Personal history of other malignant neoplasm of skin: Secondary | ICD-10-CM | POA: Diagnosis not present

## 2014-12-06 DIAGNOSIS — Z23 Encounter for immunization: Secondary | ICD-10-CM | POA: Diagnosis not present

## 2014-12-06 DIAGNOSIS — Z87442 Personal history of urinary calculi: Secondary | ICD-10-CM | POA: Insufficient documentation

## 2014-12-06 DIAGNOSIS — Z91040 Latex allergy status: Secondary | ICD-10-CM | POA: Insufficient documentation

## 2014-12-06 DIAGNOSIS — Z79899 Other long term (current) drug therapy: Secondary | ICD-10-CM | POA: Insufficient documentation

## 2014-12-06 DIAGNOSIS — J449 Chronic obstructive pulmonary disease, unspecified: Secondary | ICD-10-CM | POA: Insufficient documentation

## 2014-12-06 DIAGNOSIS — Z86018 Personal history of other benign neoplasm: Secondary | ICD-10-CM | POA: Diagnosis not present

## 2014-12-06 DIAGNOSIS — Z203 Contact with and (suspected) exposure to rabies: Secondary | ICD-10-CM | POA: Diagnosis present

## 2014-12-06 DIAGNOSIS — Z7951 Long term (current) use of inhaled steroids: Secondary | ICD-10-CM | POA: Insufficient documentation

## 2014-12-06 DIAGNOSIS — Z87891 Personal history of nicotine dependence: Secondary | ICD-10-CM | POA: Diagnosis not present

## 2014-12-06 DIAGNOSIS — Z7982 Long term (current) use of aspirin: Secondary | ICD-10-CM | POA: Insufficient documentation

## 2014-12-06 DIAGNOSIS — I10 Essential (primary) hypertension: Secondary | ICD-10-CM | POA: Diagnosis not present

## 2014-12-06 NOTE — ED Notes (Signed)
Pt states there was a bat in the house tonight and pt is uncertain how long he had been in the house; pt states he was able to get the bat out by swatting at it and picking it up with a towel and throwing it out the window

## 2014-12-07 ENCOUNTER — Emergency Department (HOSPITAL_COMMUNITY)
Admission: EM | Admit: 2014-12-07 | Discharge: 2014-12-07 | Disposition: A | Discharge status: Home / Self Care | Payer: Medicare Other | Attending: Emergency Medicine | Admitting: Emergency Medicine

## 2014-12-07 DIAGNOSIS — Z209 Contact with and (suspected) exposure to unspecified communicable disease: Secondary | ICD-10-CM

## 2014-12-07 MED ORDER — RABIES IMMUNE GLOBULIN 150 UNIT/ML IM INJ
20.0000 [IU]/kg | INJECTION | Freq: Once | INTRAMUSCULAR | Status: AC
  Administered 2014-12-07: 1650 [IU] via INTRAMUSCULAR
  Filled 2014-12-07: qty 11

## 2014-12-07 MED ORDER — RABIES VACCINE, PCEC IM SUSR
1.0000 mL | Freq: Once | INTRAMUSCULAR | Status: AC
  Administered 2014-12-07: 1 mL via INTRAMUSCULAR
  Filled 2014-12-07: qty 1

## 2014-12-07 NOTE — ED Provider Notes (Signed)
CSN: [id number]     Arrival date & time 12/06/14  2326 History   First MD Initiated Contact with Patient 12/07/14 0241     Chief Complaint  Patient presents with  . Bat Encounter      (Consider location/radiation/quality/duration/timing/severity/associated sxs/prior Treatment) HPI  This is a 69 year old male who encountered a bat in his home yesterday. He chased the bat with a fly swatter and swatted it several times. He is not sure if he contacted the bat, but he did contact the fly swatter that had contacted the bat. He is not aware of being bitten by the bat. After immobilizing the bat he grabbed it with a towel through an outside where it flew away. He is asymptomatic  Past Medical History  Diagnosis Date  . Atrial fibrillation   . Bladder tumor   . Kidney stone   . COPD (chronic obstructive pulmonary disease)   . Emphysema/COPD   . Cancer     bladder and skin  . Hypertension    Past Surgical History  Procedure Laterality Date  . Bladder tumor removed    . Kidney stones removed     Family History  Problem Relation Age of Onset  . Emphysema Father   . Cancer Brother     lung, liver  . Diabetes Mother   . Diabetes Sister    History  Substance Use Topics  . Smoking status: Former Smoker -- 1.00 packs/day for 30 years    Types: Cigarettes    Quit date: 07/07/2011  . Smokeless tobacco: Never Used  . Alcohol Use: No    Review of Systems  All other systems reviewed and are negative.   Allergies  Ciprofloxacin; Augmentin; Corticosteroids; and Latex  Home Medications   Prior to Admission medications   Medication Sig Start Date End Date Taking? Authorizing Provider  albuterol (PROVENTIL HFA;VENTOLIN HFA) 108 (90 BASE) MCG/ACT inhaler Inhale 2 puffs into the lungs every 6 (six) hours as needed for wheezing or shortness of breath. 08/03/14   Rigoberto Noel, MD  ALPRAZolam Duanne Moron) 0.25 MG tablet Take 0.25 mg by mouth daily. 07/01/14   Historical Provider, MD  aspirin  325 MG tablet Take 325 mg by mouth daily.    Historical Provider, MD  budesonide-formoterol (SYMBICORT) 160-4.5 MCG/ACT inhaler Inhale 2 puffs into the lungs 2 (two) times daily. 12/01/14   Rigoberto Noel, MD  esomeprazole (NEXIUM) 40 MG capsule Take 40 mg by mouth daily. 07/04/14   Historical Provider, MD  ipratropium (ATROVENT) 0.02 % nebulizer solution Take 2.5 mLs (0.5 mg total) by nebulization 4 (four) times daily. 08/18/14   Rigoberto Noel, MD  losartan-hydrochlorothiazide (HYZAAR) 50-12.5 MG per tablet Take 0.5 tablets by mouth daily. 07/04/14   Historical Provider, MD  metoprolol succinate (TOPROL-XL) 25 MG 24 hr tablet Take 25 mg by mouth 2 (two) times daily. 07/16/14   Historical Provider, MD  simvastatin (ZOCOR) 80 MG tablet Take 80 mg by mouth at bedtime. 07/18/14   Historical Provider, MD  triamcinolone (NASACORT) 55 MCG/ACT AERO nasal inhaler Place 2 sprays into the nose daily.    Historical Provider, MD   BP 155/76 mmHg  Pulse 86  Temp(Src) 97.7 F (36.5 C) (Oral)  Resp 20  Ht 5\' 11"  (1.803 m)  Wt 188 lb (85.276 kg)  BMI 26.23 kg/m2  SpO2 98%   Physical Exam  General: Well-developed, well-nourished male in no acute distress; appearance consistent with age of record HENT: normocephalic; atraumatic Eyes: pupils equal, round  and reactive to light; extraocular muscles intact Neck: supple Heart: Irregular rhythm Lungs: clear to auscultation bilaterally Abdomen: soft; nondistended; nontender; bowel sounds present Extremities: No deformity; full range of motion; pulses normal Neurologic: Awake, alert and oriented; motor function intact in all extremities and symmetric; no facial droop Skin: Warm and dry Psychiatric: Normal mood and affect    ED Course  Procedures (including critical care time)   MDM  We'll initiate rabies prophylaxis.    Shanon Rosser, MD 12/07/14 [phone]

## 2014-12-10 ENCOUNTER — Encounter (HOSPITAL_COMMUNITY)
Admission: RE | Admit: 2014-12-10 | Discharge: 2014-12-10 | Disposition: A | Discharge status: Home / Self Care | Payer: Medicare Other | Source: Ambulatory Visit | Attending: Emergency Medicine | Admitting: Emergency Medicine

## 2014-12-10 ENCOUNTER — Encounter (HOSPITAL_COMMUNITY)
Admission: RE | Admit: 2014-12-10 | Discharge: 2014-12-10 | Disposition: A | Discharge status: Home / Self Care | Payer: Self-pay | Source: Ambulatory Visit | Attending: Pulmonary Disease | Admitting: Pulmonary Disease

## 2014-12-10 ENCOUNTER — Encounter (HOSPITAL_COMMUNITY): Payer: Self-pay

## 2014-12-10 DIAGNOSIS — Z23 Encounter for immunization: Secondary | ICD-10-CM | POA: Diagnosis present

## 2014-12-10 DIAGNOSIS — Z203 Contact with and (suspected) exposure to rabies: Secondary | ICD-10-CM | POA: Insufficient documentation

## 2014-12-10 DIAGNOSIS — J439 Emphysema, unspecified: Secondary | ICD-10-CM | POA: Insufficient documentation

## 2014-12-10 MED ORDER — RABIES VACCINE, PCEC IM SUSR
INTRAMUSCULAR | Status: AC
  Filled 2014-12-10: qty 1

## 2014-12-10 MED ORDER — RABIES VACCINE, PCEC IM SUSR
1.0000 mL | Freq: Once | INTRAMUSCULAR | Status: AC
  Administered 2014-12-10: 1 mL via INTRAMUSCULAR

## 2014-12-10 NOTE — Discharge Instructions (Signed)
Rabies Immune Globulin, human RIG solution for injection What is this medicine? RABIES IMMUNE GLOBULIN (ray BEES im MYOON GLOB yoo lin) is used to prevent rabies infection. Rabies is mostly a disease of animals. Humans may get rabies if they are bitten by animals that have rabies. This medicine is given to someone after they have been exposed. This medicine may be used for other purposes; ask your health care provider or pharmacist if you have questions. COMMON BRAND NAME(S): BayRab, HyperRAB S/D, Imogam What should I tell my health care provider before I take this medicine? They need to know if you have any of these conditions: -bleeding disorder -IgA deficiency -recently received or scheduled to receive a vaccine -take medicines that treat or prevent blood clots -an unusual or allergic reaction to immune globulin, other medicines, foods, dyes, or preservatives -pregnant or trying to get pregnant -breast-feeding How should I use this medicine? This medicine is for injection into the area around a wound or into a muscle. It is given by a health care professional in a hospital or clinic setting. A copy of Vaccine Information Statements will be given. Read this sheet carefully each time. The sheet may change frequently. Talk to your pediatrician regarding the use of this medicine in children. While this drug may be prescribed for children and infants, precautions do apply. Overdosage: If you think you've taken too much of this medicine contact a poison control center or emergency room at once. Overdosage: If you think you have taken too much of this medicine contact a poison control center or emergency room at once. NOTE: This medicine is only for you. Do not share this medicine with others. What if I miss a dose? This does not apply. What may interact with this medicine? -Live virus vaccines This list may not describe all possible interactions. Give your health care provider a list of all the  medicines, herbs, non-prescription drugs, or dietary supplements you use. Also tell them if you smoke, drink alcohol, or use illegal drugs. Some items may interact with your medicine. What should I watch for while using this medicine? This medicine can decrease the response to a vaccine. If you need to get vaccinated, tell your healthcare professional if you have received this medicine within the last 4 months. Extra booster doses may be needed. Talk to your doctor to see if a different vaccination schedule is needed. This medicine contains products from human blood. It may be possible to pass an infection in this medicine, but no cases have been reported. Talk to your doctor about the risks and benefits of this medicine. Your condition will be monitored carefully while you are receiving this medicine. What side effects may I notice from receiving this medicine? Side effects that you should report to your doctor or health care professional as soon as possible: -allergic reactions like skin rash, itching or hives, swelling of the face, lips, or tongue Side effects that usually do not require medical attention (Report these to your doctor or health care professional if they continue or are bothersome.): -fever -headache -pain, redness, swelling, or irritation at site where injected This list may not describe all possible side effects. Call your doctor for medical advice about side effects. You may report side effects to FDA at 1-800-FDA-1088. Where should I keep my medicine? This drug is given in a hospital or clinic and will not be stored at home. NOTE: This sheet is a summary. It may not cover all possible information. If you  have questions about this medicine, talk to your doctor, pharmacist, or health care provider.  2015, Elsevier/Gold Standard. (2009-09-23 11:24:23)

## 2014-12-14 ENCOUNTER — Encounter (HOSPITAL_COMMUNITY): Payer: Self-pay

## 2014-12-14 ENCOUNTER — Encounter (HOSPITAL_COMMUNITY)
Admission: RE | Admit: 2014-12-14 | Discharge: 2014-12-14 | Disposition: A | Discharge status: Home / Self Care | Payer: Medicare Other | Source: Ambulatory Visit | Attending: Emergency Medicine | Admitting: Emergency Medicine

## 2014-12-14 DIAGNOSIS — Z23 Encounter for immunization: Secondary | ICD-10-CM | POA: Diagnosis not present

## 2014-12-14 MED ORDER — RABIES VACCINE, PCEC IM SUSR
1.0000 mL | Freq: Once | INTRAMUSCULAR | Status: AC
  Administered 2014-12-14: 1 mL via INTRAMUSCULAR

## 2014-12-15 ENCOUNTER — Encounter (HOSPITAL_COMMUNITY)
Admission: RE | Admit: 2014-12-15 | Discharge: 2014-12-15 | Disposition: A | Discharge status: Home / Self Care | Payer: Self-pay | Source: Ambulatory Visit | Attending: Pulmonary Disease | Admitting: Pulmonary Disease

## 2014-12-17 ENCOUNTER — Encounter (HOSPITAL_COMMUNITY)
Admission: RE | Admit: 2014-12-17 | Discharge: 2014-12-17 | Disposition: A | Discharge status: Home / Self Care | Payer: Self-pay | Source: Ambulatory Visit | Attending: Pulmonary Disease | Admitting: Pulmonary Disease

## 2014-12-21 ENCOUNTER — Encounter (HOSPITAL_COMMUNITY)
Admission: RE | Admit: 2014-12-21 | Discharge: 2014-12-21 | Disposition: A | Discharge status: Home / Self Care | Payer: Medicare Other | Source: Ambulatory Visit | Attending: Emergency Medicine | Admitting: Emergency Medicine

## 2014-12-21 DIAGNOSIS — Z23 Encounter for immunization: Secondary | ICD-10-CM | POA: Diagnosis not present

## 2014-12-21 MED ORDER — RABIES VACCINE, PCEC IM SUSR
1.0000 mL | Freq: Once | INTRAMUSCULAR | Status: AC
  Administered 2014-12-21: 1 mL via INTRAMUSCULAR

## 2014-12-21 MED ORDER — RABIES VACCINE, PCEC IM SUSR
INTRAMUSCULAR | Status: AC
  Filled 2014-12-21: qty 1

## 2014-12-22 ENCOUNTER — Encounter (HOSPITAL_COMMUNITY)
Admission: RE | Admit: 2014-12-22 | Discharge: 2014-12-22 | Disposition: A | Discharge status: Home / Self Care | Payer: Self-pay | Source: Ambulatory Visit | Attending: Pulmonary Disease | Admitting: Pulmonary Disease

## 2014-12-24 ENCOUNTER — Encounter (HOSPITAL_COMMUNITY)
Admission: RE | Admit: 2014-12-24 | Discharge: 2014-12-24 | Disposition: A | Discharge status: Home / Self Care | Payer: Self-pay | Source: Ambulatory Visit | Attending: Pulmonary Disease | Admitting: Pulmonary Disease

## 2014-12-29 ENCOUNTER — Encounter (HOSPITAL_COMMUNITY)
Admission: RE | Admit: 2014-12-29 | Discharge: 2014-12-29 | Disposition: A | Discharge status: Home / Self Care | Payer: Self-pay | Source: Ambulatory Visit | Attending: Pulmonary Disease | Admitting: Pulmonary Disease

## 2014-12-31 ENCOUNTER — Encounter (HOSPITAL_COMMUNITY)
Admission: RE | Admit: 2014-12-31 | Discharge: 2014-12-31 | Disposition: A | Discharge status: Home / Self Care | Payer: Self-pay | Source: Ambulatory Visit | Attending: Pulmonary Disease | Admitting: Pulmonary Disease

## 2015-01-05 ENCOUNTER — Encounter (HOSPITAL_COMMUNITY)
Admission: RE | Admit: 2015-01-05 | Discharge: 2015-01-05 | Disposition: A | Discharge status: Home / Self Care | Payer: Self-pay | Source: Ambulatory Visit | Attending: Pulmonary Disease | Admitting: Pulmonary Disease

## 2015-01-07 ENCOUNTER — Encounter (HOSPITAL_COMMUNITY)
Admission: RE | Admit: 2015-01-07 | Discharge: 2015-01-07 | Disposition: A | Discharge status: Home / Self Care | Payer: Self-pay | Source: Ambulatory Visit | Attending: Pulmonary Disease | Admitting: Pulmonary Disease

## 2015-02-15 ENCOUNTER — Ambulatory Visit (INDEPENDENT_AMBULATORY_CARE_PROVIDER_SITE_OTHER): Payer: Medicare Other | Admitting: Adult Health

## 2015-02-15 ENCOUNTER — Encounter: Payer: Self-pay | Admitting: Adult Health

## 2015-02-15 VITALS — BP 116/78 | HR 78 | Temp 97.8°F | Ht 71.0 in | Wt 184.0 lb

## 2015-02-15 DIAGNOSIS — J309 Allergic rhinitis, unspecified: Secondary | ICD-10-CM

## 2015-02-15 DIAGNOSIS — J438 Other emphysema: Secondary | ICD-10-CM | POA: Diagnosis not present

## 2015-02-15 NOTE — Progress Notes (Signed)
   Subjective:    Patient ID: Matthew Hall, male    DOB: Dec 29, 1945, 68 y.o.   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  Significant tests/ events  Episode of bronchitis 10/2007 with abnormal CXR, CT showed >> RLL & lingular bronchectasis & areas of pleuro-parenchymal scarring. 8 mm low density lesion in liver >> stable on rpt CT7/2009. Incidentally, not seen on CT in 1995.  FEV1 trend '95 59% >> 64% in 09/2007 >> 59% in 03/2008   09/2014 neb provided  Spirometry >> FEv1 40%, ratio 62  Last COPD flare in 07/2014 back to baseline now, denies DOE when feeling well Stable to participate in pulmonary rehabilitation Using Atrovent nebs 4 times a day  02/15/2015 Follow up : COPD  Returns for 3 month follow up  Says he is doing well with his breathing .  On Symbicort Twice daily  .  On Atrovent neb Twice daily  .  PX and Prevnar are utd.  CXR in January with no acute process .  Feels pulmonary rehab really helped In maintenance program now.  Patient denies any chest pain, orthopnea, PND, leg swelling.    Review of Systems  neg for any significant sore throat, dysphagia, itching, sneezing, nasal congestion or excess/ purulent secretions, fever, chills, sweats, unintended wt loss, pleuritic or exertional cp, hempoptysis, orthopnea pnd or change in chronic leg swelling. Also denies presyncope, palpitations, heartburn, abdominal pain, nausea, vomiting, diarrhea or change in bowel or urinary habits, dysuria,hematuria, rash, arthralgias, visual complaints, headache, numbness weakness or ataxia.      Objective:   Physical Exam  Gen. Pleasant, well-nourished, in no distress ENT - no lesions, no post nasal drip Neck: No JVD, no thyromegaly, no carotid  bruits Lungs: no use of accessory muscles, no dullness to percussion, decreased breath sounds in the bases no rhonchi  Cardiovascular: Rhythm regular, heart sounds  normal, no murmurs or gallops, no peripheral edema Musculoskeletal: No deformities, no cyanosis or clubbing        Assessment & Plan:

## 2015-02-15 NOTE — Assessment & Plan Note (Signed)
Compensated on present regimen Doing well and pulmonary rehabilitation program. Vaccines with Prevnar and Pneumovax up-to-date  Plan Continue on Symbicort 2 puffs Twice daily  , rinse after use.  Continue on Atrovent Neb Twice daily   follow up Dr. Elsworth Soho  In 3-4 months and As needed   Keep up good work with pulmonary rehab.

## 2015-02-15 NOTE — Patient Instructions (Signed)
Continue on Symbicort 2 puffs Twice daily  , rinse after use.  Continue on Atrovent Neb Twice daily   follow up Dr. Elsworth Soho  In 3-4 months and As needed   Keep up good work with pulmonary rehab.

## 2015-02-15 NOTE — Assessment & Plan Note (Signed)
Compensated on present regimen. Continue on Nasacort

## 2015-02-19 NOTE — Progress Notes (Signed)
Reviewed & agree with plan  

## 2015-07-24 ENCOUNTER — Emergency Department (HOSPITAL_COMMUNITY)
Admission: EM | Admit: 2015-07-24 | Discharge: 2015-07-24 | Disposition: A | Discharge status: Home / Self Care | Payer: Medicare Other | Attending: Emergency Medicine | Admitting: Emergency Medicine

## 2015-07-24 ENCOUNTER — Emergency Department (HOSPITAL_COMMUNITY): Payer: Medicare Other

## 2015-07-24 ENCOUNTER — Encounter (HOSPITAL_COMMUNITY): Payer: Self-pay | Admitting: *Deleted

## 2015-07-24 DIAGNOSIS — Z7982 Long term (current) use of aspirin: Secondary | ICD-10-CM | POA: Insufficient documentation

## 2015-07-24 DIAGNOSIS — Z85828 Personal history of other malignant neoplasm of skin: Secondary | ICD-10-CM | POA: Diagnosis not present

## 2015-07-24 DIAGNOSIS — R42 Dizziness and giddiness: Secondary | ICD-10-CM | POA: Insufficient documentation

## 2015-07-24 DIAGNOSIS — Z91040 Latex allergy status: Secondary | ICD-10-CM | POA: Diagnosis not present

## 2015-07-24 DIAGNOSIS — Z8551 Personal history of malignant neoplasm of bladder: Secondary | ICD-10-CM | POA: Insufficient documentation

## 2015-07-24 DIAGNOSIS — I4891 Unspecified atrial fibrillation: Secondary | ICD-10-CM | POA: Diagnosis not present

## 2015-07-24 DIAGNOSIS — Z87442 Personal history of urinary calculi: Secondary | ICD-10-CM | POA: Diagnosis not present

## 2015-07-24 DIAGNOSIS — Z87891 Personal history of nicotine dependence: Secondary | ICD-10-CM | POA: Insufficient documentation

## 2015-07-24 DIAGNOSIS — Z79899 Other long term (current) drug therapy: Secondary | ICD-10-CM | POA: Insufficient documentation

## 2015-07-24 DIAGNOSIS — J439 Emphysema, unspecified: Secondary | ICD-10-CM | POA: Diagnosis not present

## 2015-07-24 DIAGNOSIS — F419 Anxiety disorder, unspecified: Secondary | ICD-10-CM | POA: Diagnosis not present

## 2015-07-24 DIAGNOSIS — Z7951 Long term (current) use of inhaled steroids: Secondary | ICD-10-CM | POA: Insufficient documentation

## 2015-07-24 DIAGNOSIS — I10 Essential (primary) hypertension: Secondary | ICD-10-CM | POA: Insufficient documentation

## 2015-07-24 HISTORY — DX: Anxiety disorder, unspecified: F41.9

## 2015-07-24 LAB — URINALYSIS, ROUTINE W REFLEX MICROSCOPIC
Bilirubin Urine: NEGATIVE
Glucose, UA: NEGATIVE mg/dL
Hgb urine dipstick: NEGATIVE
Ketones, ur: NEGATIVE mg/dL
Leukocytes, UA: NEGATIVE
Nitrite: NEGATIVE
Protein, ur: NEGATIVE mg/dL
Specific Gravity, Urine: 1.01 (ref 1.005–1.030)
pH: 6 (ref 5.0–8.0)

## 2015-07-24 LAB — I-STAT CHEM 8, ED
BUN: 16 mg/dL (ref 6–20)
Calcium, Ion: 1.17 mmol/L (ref 1.13–1.30)
Chloride: 103 mmol/L (ref 101–111)
Creatinine, Ser: 0.9 mg/dL (ref 0.61–1.24)
Glucose, Bld: 98 mg/dL (ref 65–99)
HCT: 48 % (ref 39.0–52.0)
Hemoglobin: 16.3 g/dL (ref 13.0–17.0)
Potassium: 3.8 mmol/L (ref 3.5–5.1)
Sodium: 141 mmol/L (ref 135–145)
TCO2: 26 mmol/L (ref 0–100)

## 2015-07-24 LAB — I-STAT TROPONIN, ED: Troponin i, poc: 0 ng/mL (ref 0.00–0.08)

## 2015-07-24 NOTE — Discharge Instructions (Signed)
°Emergency Department Resource Guide °1) Find a Doctor and Pay Out of Pocket °Although you won't have to find out who is covered by your insurance plan, it is a good idea to ask around and get recommendations. You will then need to call the office and see if the doctor you have chosen will accept you as a new patient and what types of options they offer for patients who are self-pay. Some doctors offer discounts or will set up payment plans for their patients who do not have insurance, but you will need to ask so you aren't surprised when you get to your appointment. ° °2) Contact Your Local Health Department °Not all health departments have doctors that can see patients for sick visits, but many do, so it is worth a call to see if yours does. If you don't know where your local health department is, you can check in your phone book. The CDC also has a tool to help you locate your state's health department, and many state websites also have listings of all of their local health departments. ° °3) Find a Walk-in Clinic °If your illness is not likely to be very severe or complicated, you may want to try a walk in clinic. These are popping up all over the country in pharmacies, drugstores, and shopping centers. They're usually staffed by nurse practitioners or physician assistants that have been trained to treat common illnesses and complaints. They're usually fairly quick and inexpensive. However, if you have serious medical issues or chronic medical problems, these are probably not your best option. ° °No Primary Care Doctor: °- Call Health Connect at  832-8000 - they can help you locate a primary care doctor that  accepts your insurance, provides certain services, etc. °- Physician Referral Service- 1-800-533-3463 ° °Chronic Pain Problems: °Organization         Address  Phone   Notes  °[facility]  ([phone]) [phone] Patients need to be referred by their primary care doctor.  ° °Medication  Assistance: °Organization         Address  Phone   Notes  °[place] [facility] [address] °[place], [place] [address] (336) 641-8030 --Must be a resident of [place] °-- Must have NO insurance coverage whatsoever (no Medicaid/ Medicare, etc.) °-- The pt. MUST have a primary care doctor that directs their care regularly and follows them in the community °  °[facility]  (866) 331-1348   °[employer]  (888) 892-1162   ° °Agencies that provide inexpensive medical care: °Organization         Address  Phone   Notes  °[facility]  (336) 832-8035   °[facility]    (336) 832-7272   °[facility] [address] °[place], [place] [address] (336) 832-4777   °[facility] [address], °[place] (336) 271-4999   °[facility]    (336) 373-0678   °[facility]    (336) 272-1050   °[facility] ° [address], [place] Phone:  (336) 832-4444, Fax:  (336) 832-4440 Hours of Operation:  9 am - 6 pm, M-F.  Also accepts Medicaid/Medicare and self-pay.  °[facility] ° [address],  Phone: (336) [phone], Fax: (336) [phone]. Hours of Operation:  8:30 am - 5:30 pm, M-F.  Also accepts Medicaid and self-pay.  °[facility] [place] [address], [place] Phone: (336) 878-6027   °[facility] [address], [place], [place] (336)723-1848, Ext. 123 Mondays & Thursdays: 7-9 AM.  First 15 patients are seen on a first come, first serve basis. °  ° °Medicaid-accepting [place] Providers: ° °Organization         Address  Phone   Notes  °[facility] [address], [place] (336) 641-2100 Also accepts self-pay patients.  °[facility] [address], [place] ° (336) 856-9996   °[facility] [address], [place]  (336) 288-8857   °[facility] [address] [place] [address], [place] (336) 299-7000   °Veita Bland [address], [place]  ° (336) 373-1557 Only accepts [place] Access Medicaid patients after they have their name applied to their card.  ° °Self-Pay (no insurance) in [place]: ° °Organization         Address  Phone   Notes  °Sickle Cell Patients, [facility] [address], [place] (336) 832-1970   °[facility] [address], [place] (336) 832-4400   °[facility] ° [address],  (336) 992-4800   °[facility]/Dr. Osei-Bonsu ° [address] [place] [address], [place] or [address], [place] (336) [phone] Phone number for both [place] and [place] locations is the same.  °[facility] [address], [place] (336) 299-0000   °[facility] [address] [place] [address], [place] or [address] (336) 852-7530 °(336) 878-2260   °[facility] [address],  (336) 350-1642, phone; (336) 294-5005, fax Sees patients 1st and 3rd Saturday of every month.  Must not qualify for public or private insurance (i.e. Medicaid, Medicare, [place] Health Choice, Veterans' Benefits) • Household income should be no more than 200% of the poverty level •The clinic cannot treat you if you are pregnant or think you are pregnant • Sexually transmitted diseases are not treated at the clinic.  ° ° °Dental Care: °Organization         Address  Phone  Notes  °[place] [facility] [address],  (336) 641-6152 Accepts children up to age 21 who are enrolled in Medicaid or [place] Health Choice; pregnant women with a Medicaid card; and children who have applied for Medicaid or [place] Health Choice, but were declined, whose parents can pay a reduced fee at time of service.  °[place]  [facility] [place]  [address], [place] (336) 641-7733 Accepts children up to age 21 who are enrolled in Medicaid or [place] Health Choice; pregnant women with a Medicaid card; and children who have applied for Medicaid or [place] Health Choice, but were declined, whose parents can pay a reduced fee at time of service.  °[facility] ° [address],  (336) 641-4533 Patients are seen by appointment only. Walk-ins are not accepted. [facility] will see patients 18 years of age and older. °Monday - Tuesday (8am-5pm) °Most Wednesdays (8:30-5pm) °$30 per visit, cash only  °[facility] ° [address], [place] (336) 641-4533 Patients are seen by appointment only. Walk-ins are not accepted. [facility] will see patients 18 years of age and older. °One   Wednesday Evening (Monthly: Volunteer Based).  $30 per visit, cash only  °[facility]  (919) 537-3737 for adults; Children under age 4, call Graduate Pediatric Dentistry at ([phone]) [phone]. Children aged 4-14, please call ([phone]) [phone] to request a pediatric application. ° Dental services are provided in all areas of dental care including fillings, crowns and bridges, complete and partial dentures, implants, gum treatment, root canals, and extractions. Preventive care is also provided. Treatment is provided to both adults and children. °Patients are selected via a lottery and there is often a waiting list. °  °[facility] [address], °[place] ° (336) 763-8833 [email or web address].[email or web address] °  °[facility] [address], [place], [place] (336)723-1848, Ext. 123 Second and Fourth Thursday of each month, opens at 6:30 AM; Clinic ends at 9 AM.  Patients are seen on a first-come first-served basis, and a limited number are seen during each clinic.  ° °[facility] ° [address], [place], [place] (336) [phone]    Eligibility Requirements °You must have lived in [place], [place], or [place] counties for at least the last three months. °  You cannot be eligible for state or federal sponsored healthcare insurance, including Veterans Administration, Medicaid, or Medicare. °  You generally cannot be eligible for healthcare insurance through your employer.  °  How to apply: °Eligibility screenings are held every Tuesday and Wednesday afternoon from 1:00 pm until 4:00 pm. You do not need an appointment for the interview!  °[facility] [address], [place], [place] 336-631-2330   °[facility]  336-342-8273   °[facility]  336-703-3100   °[facility]  336-570-6415   ° °Behavioral Health Resources in the Community: °Intensive Outpatient Programs °Organization         Address  Phone  Notes  °[facility] [address], [place], [place] 336-878-6098   °[facility] [address], [place], [place] 336-832-9800   °[facility] [address], [place], [place] ° 336-882-2125   °[facility] [address],  °[place], [place] 1-800-853-5163 or 336-641-4981   °Substance Abuse Resources °Organization         Address  Phone  Notes  °[facility]  336-882-2125   °[facility]  336-784-9470   °[facility]  336-285-9073   °[facility]  336-845-3988   °Residential & Outpatient Substance Abuse Program  1-800-659-3381   °Psychological Services °Organization         Address  Phone  Notes  °[facility]  336- 832-9600   °[facility]  336- 378-7881   °[facility] [address], [place] 1-800-853-5163 or 336-641-4981   ° °Mobile Crisis Teams °Organization         Address  Phone  Notes  °[facility], Mobile Crisis Care Unit  1-877-626-1772   °Assertive °[facility] ° [address]  [place], [place] 336-834-9664   °Sharon DeEsch [address] °[place] 336-554-5454   ° °Self-Help/Support Groups °Organization         Address  Phone             Notes  °[facility]  - variety of support groups  336- 373-1402 Call for more information  °Narcotics Anonymous (NA), [facility] [address], °[place]  2 meetings at this location  ° °  Residential Treatment Programs Organization         Address  Phone  Notes  [facility] [address]    [place]  1-(231) 616-1617   [facility]  [address], [place] [id number], [place], [place]   [place], [place] 878-376-3186 Admissions: 8am-3pm M-F  [facility] [address]    [place], [place] [phone]   [facility] [address] [place], [place], [place]   [facility] [address]  [place], [place]   [facility] Intensive Outpatient [address], [place], [place]   [facility] ([place] [address]  [place], [place] 1-402-594-1777 or [phone]   [facility] [address], [place], [place] Accepts Medicaid  [facility] [place] [address]  [place] 1-(405) 044-9269 Substance Abuse/Addiction Treatment   [facility]         Address  Phone  Notes  [facility]  551-380-9669   Domenic Schwab, PhD [address], [place]   212-425-5458 or 484-310-6199   [facility]egin[facility] Ramirez [place], [place] (254)[phone]   [facility] [address] [place], [place] (520)657-0963 Insurance/Medicaid/sponsorship through [facility] [address]                                    [place], [place] 332-728-7533 [place] [address], [place] ([phone]    Dr. Adele Schilder  [phone]   [facility]. 1) [address] [place] 2) [place] 3)  [address], [place] [phone]  [phone]   [place] [id number] or [phone] (After Hours)      Take your usual prescriptions as previously directed.  Take your blood pressure only ONCE per day, either in the morning after you take your medicine(s) or in the evening before you go to bed, only once or twice per week.  Always sit quietly for at least 15 minutes before taking your blood pressure.  Keep a diary of your blood pressures to show your doctor at your follow up office visit.  Call your regular medical doctor on Monday morning to schedule a follow up appointment within the next 2 to 3 days.  Return to the Emergency Department immediately sooner if worsening.

## 2015-07-24 NOTE — ED Notes (Addendum)
Pt c/o high blood pressure x 2 weeks. Pt states today he has been having high readings. Pt states he felt light headed earlier today.

## 2015-07-24 NOTE — ED Provider Notes (Signed)
CSN: [id number]     Arrival date & time 07/24/15  2007 History   First MD Initiated Contact with Patient 07/24/15 2028     Chief Complaint  Patient presents with  . Hypertension     HPI Pt was seen at 2030. Per pt, c/o gradual onset and persistence of multiple intermittent episodes of "high blood pressure" for the past 2 weeks. Pt states he was evaluated at [facility] 2 weeks ago for same, had "blood tests and ultrasounds," and was "told I was OK." Denies meds changes. Pt then f/u with his Cards MD last week, told he "needed to think about starting Eliquis" for his afib, but otherwise "was OK" and to f/u in 6 months. Denies meds changes. Pt states he has been intermittently lightheaded for the past 2 weeks. States he takes his BP "5 or 6 or more times per day," every day, for the past 2 weeks. Pt states he was evaluated at an [facility] PTA, and rx HCTZ. Pt states he came to this ED for another opinion. Does endorse high anxiety, and states he was told he was "high strung." States he does not feel his xanax "is working." Denies CP/palpitations, no SOB/cough, no abd pain, no N/V/D, no SI, no HI, no hallucinations, no visual changes, no focal motor weakness, no tingling/numbness in extremities, no ataxia, no slurred speech, no facial droop.     Past Medical History  Diagnosis Date  . Atrial fibrillation ([place])   . Bladder tumor   . Kidney stone   . COPD (chronic obstructive pulmonary disease) ([place])   . Emphysema/COPD ([place])   . 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 Hypertension   . Anxiety    Past Surgical History  Procedure Laterality Date  . Bladder tumor removed    . Kidney stones removed     Family History  Problem Relation Age of Onset  . Emphysema Father   . Cancer Brother     lung, liver  . Diabetes Mother   . Diabetes Sister    Social History  Substance Use Topics  . Smoking status: Former Smoker -- 1.00 packs/day for 30 years    Types: Cigarettes    Quit date: 07/07/2011   . Smokeless tobacco: Never Used  . Alcohol Use: No    Review of Systems ROS: Statement: All systems negative except as marked or noted in the HPI; Constitutional: Negative for fever and chills. ; ; Eyes: Negative for eye pain, redness and discharge. ; ; ENMT: Negative for ear pain, hoarseness, nasal congestion, sinus pressure and sore throat. ; ; Cardiovascular: Negative for chest pain, palpitations, diaphoresis, dyspnea and peripheral edema. ; ; Respiratory: Negative for cough, wheezing and stridor. ; ; Gastrointestinal: Negative for nausea, vomiting, diarrhea, abdominal pain, blood in stool, hematemesis, jaundice and rectal bleeding. . ; ; Genitourinary: Negative for dysuria, flank pain and hematuria. ; ; Musculoskeletal: Negative for back pain and neck pain. Negative for swelling and trauma.; ; Skin: Negative for pruritus, rash, abrasions, blisters, bruising and skin lesion.; ; Neuro: +occasionally feels "lightheaded." Negative for headache and neck stiffness. Negative for weakness, altered level of consciousness , altered mental status, extremity weakness, paresthesias, involuntary movement, seizure and syncope.     Allergies  Augmentin; Ciprofloxacin; Corticosteroids; and Latex  Home Medications   Prior to Admission medications   Medication Sig Start Date End Date Taking? Authorizing Provider  albuterol (PROVENTIL HFA;VENTOLIN HFA) 108 (90 BASE) MCG/ACT inhaler Inhale 2 puffs into  the lungs every 6 (six) hours as needed for wheezing or shortness of breath. 08/03/14   Rigoberto Noel, MD  ALPRAZolam Duanne Moron) 0.25 MG tablet Take 0.25 mg by mouth daily. 07/01/14   Historical Provider, MD  aspirin 325 MG tablet Take 325 mg by mouth daily.    Historical Provider, MD  budesonide-formoterol (SYMBICORT) 160-4.5 MCG/ACT inhaler Inhale 2 puffs into the lungs 2 (two) times daily. 12/01/14   Rigoberto Noel, MD  ipratropium (ATROVENT) 0.02 % nebulizer solution Take 2.5 mLs (0.5 mg total) by nebulization 4  (four) times daily. 08/18/14   Rigoberto Noel, MD  losartan-hydrochlorothiazide (HYZAAR) 50-12.5 MG per tablet Take 0.5 tablets by mouth daily. 07/04/14   Historical Provider, MD  metoprolol succinate (TOPROL-XL) 25 MG 24 hr tablet Take 25 mg by mouth 2 (two) times daily. 07/16/14   Historical Provider, MD  omeprazole (PRILOSEC) 40 MG capsule Take 40 mg by mouth daily. 12/28/14   Historical Provider, MD  simvastatin (ZOCOR) 80 MG tablet Take 80 mg by mouth at bedtime. 07/18/14   Historical Provider, MD  triamcinolone (NASACORT) 55 MCG/ACT AERO nasal inhaler Place 2 sprays into the nose daily.    Historical Provider, MD   BP 185/96 mmHg  Pulse 87  Temp(Src) 98.4 F (36.9 C) (Oral)  Resp 20  Ht 5' 11.5" (1.816 m)  Wt 177 lb (80.287 kg)  BMI 24.35 kg/m2  SpO2 99%   21:30 Orthostatic Vital Signs KP  Orthostatic Lying  - BP- Lying: 175/90 mmHg ; Pulse- Lying: 79  Orthostatic Sitting - BP- Sitting: 173/91 mmHg ; Pulse- Sitting: 87  Orthostatic Standing at 0 minutes - BP- Standing at 0 minutes: 164/97 mmHg ; Pulse- Standing at 0 minutes: 84     Patient Vitals for the past 24 hrs:  BP Temp Temp src Pulse Resp SpO2 Height Weight  07/24/15 2136 164/97 mmHg - - 78 19 97 % - -  07/24/15 2100 145/92 mmHg - - 64 22 98 % - -  07/24/15 2017 185/96 mmHg - - - - - - -  07/24/15 2016 (!) 200/113 mmHg 98.4 F (36.9 C) Oral 87 20 99 % 5' 11.5" (1.816 m) 177 lb (80.287 kg)    Physical Exam  2035: Physical examination:  Nursing notes reviewed; Vital signs and O2 SAT reviewed;  Constitutional: Well developed, Well nourished, Well hydrated, In no acute distress; Head:  Normocephalic, atraumatic; Eyes: EOMI, PERRL, No scleral icterus; ENMT: Mouth and pharynx normal, Mucous membranes moist; Neck: Supple, Full range of motion, No lymphadenopathy; Cardiovascular: Irregular irregular rate and rhythm, No gallop; Respiratory: Breath sounds clear & equal bilaterally, No wheezes.  Speaking full sentences with ease, Normal  respiratory effort/excursion; Chest: Nontender, Movement normal; Abdomen: Soft, Nontender, Nondistended, Normal bowel sounds; Genitourinary: No CVA tenderness; Extremities: Pulses normal, No tenderness, No edema, No calf edema or asymmetry.; Neuro: AA&Ox3, Major CN grossly intact. No facial droop.  Speech clear. No gross focal motor or sensory deficits in extremities.; Skin: Color normal, Warm, Dry.; Psych:  Anxious. Most of HPI and PE given to me with his eyes closed.    ED Course  Procedures (including critical care time)  Labs Review  Imaging Review  I have personally reviewed and evaluated these images and lab results as part of my medical decision-making.   EKG Interpretation   Date/Time:  Saturday July 24 2015 20:30:49 EST Ventricular Rate:  77 PR Interval:    QRS Duration: 134 QT Interval:  400 QTC Calculation: 453 R  Axis:   -73 Text Interpretation:  Atrial fibrillation Ventricular premature complex  Nonspecific IVCD with LAD Left ventricular hypertrophy Anterior infarct,  old When compared with ECG of 07/30/2014 No significant change was found  Confirmed by [facility]  MD, Nunzio Cory [id number]) on 07/24/2015 8:52:01 PM      MDM  MDM Reviewed: previous chart, nursing note and vitals Reviewed previous: labs and ECG Interpretation: labs, ECG, x-ray and CT scan      Results for orders placed or performed during the hospital encounter of 07/24/15  Urinalysis, Routine w reflex microscopic  Result Value Ref Range   Color, Urine YELLOW YELLOW   APPearance CLEAR CLEAR   Specific Gravity, Urine 1.010 1.005 - 1.030   pH 6.0 5.0 - 8.0   Glucose, UA NEGATIVE NEGATIVE mg/dL   Hgb urine dipstick NEGATIVE NEGATIVE   Bilirubin Urine NEGATIVE NEGATIVE   Ketones, ur NEGATIVE NEGATIVE mg/dL   Protein, ur NEGATIVE NEGATIVE mg/dL   Nitrite NEGATIVE NEGATIVE   Leukocytes, UA NEGATIVE NEGATIVE  I-stat Chem 8, ED  Result Value Ref Range   Sodium 141 135 - 145 mmol/L   Potassium 3.8  3.5 - 5.1 mmol/L   Chloride 103 101 - 111 mmol/L   BUN 16 6 - 20 mg/dL   Creatinine, Ser 0.90 0.61 - 1.24 mg/dL   Glucose, Bld 98 65 - 99 mg/dL   Calcium, Ion 1.17 1.13 - 1.30 mmol/L   TCO2 26 0 - 100 mmol/L   Hemoglobin 16.3 13.0 - 17.0 g/dL   HCT 48.0 39.0 - 52.0 %  I-stat troponin, ED  Result Value Ref Range   Troponin i, poc 0.00 0.00 - 0.08 ng/mL   Comment 3           Dg Chest 2 View 07/24/2015  CLINICAL DATA:  Hypertension for 2 weeks. Dizziness. History of atrial fibrillation. EXAM: CHEST  2 VIEW COMPARISON:  July 30, 2014 FINDINGS: There is scarring in the left lower lobe slightly lateral to the left heart border as well as in the medial right base. There is no frank edema or consolidation. The heart size and pulmonary vascularity are normal. No adenopathy. No bone lesions. IMPRESSION: Areas of scarring bilaterally.  No edema or consolidation. Electronically Signed   By: Lowella Grip III M.D.   On: 07/24/2015 21:19   Ct Head Wo Contrast 07/24/2015  CLINICAL DATA:  Lightheaded to earlier today, elevated blood pressure for 2 weeks including today, history of bladder and skin cancer, COPD, anxiety, hypertension EXAM: CT HEAD WITHOUT CONTRAST TECHNIQUE: Contiguous axial images were obtained from the base of the skull through the vertex without intravenous contrast. COMPARISON:  None FINDINGS: Mild generalized atrophy. Normal ventricular morphology. No midline shift or mass effect. Otherwise normal appearance of brain parenchyma. No intracranial hemorrhage, mass lesion, or acute infarction. Visualized paranasal sinuses clear. Bones unremarkable. IMPRESSION: Generalized atrophy. No acute intracranial abnormalities. Electronically Signed   By: Lavonia Dana M.D.   On: 07/24/2015 21:27    2200:  Workup reassuring. BP improved without intervention. Long d/w pt and his wife regarding BP control, including, but not limited to:  Checking home BP monitor at doctor's office to compare readings,  not taking BP multiple times per day/every day, control of anxiety, as well as taking meds as prescribed. Pt and wife both verb understanding. Pt states he "feels better" and wants to go home now. Dx and testing d/w pt and family.  Questions answered.  Verb understanding, agreeable to d/c home with  outpt f/u.   Francine Graven, DO 07/28/15 [phone]

## 2015-08-10 ENCOUNTER — Encounter: Payer: Self-pay | Admitting: Pulmonary Disease

## 2015-08-10 ENCOUNTER — Ambulatory Visit (INDEPENDENT_AMBULATORY_CARE_PROVIDER_SITE_OTHER): Payer: Medicare Other | Admitting: Pulmonary Disease

## 2015-08-10 VITALS — BP 138/86 | HR 69 | Ht 72.0 in | Wt 184.6 lb

## 2015-08-10 DIAGNOSIS — J479 Bronchiectasis, uncomplicated: Secondary | ICD-10-CM

## 2015-08-10 DIAGNOSIS — R0602 Shortness of breath: Secondary | ICD-10-CM | POA: Diagnosis not present

## 2015-08-10 DIAGNOSIS — J438 Other emphysema: Secondary | ICD-10-CM | POA: Diagnosis not present

## 2015-08-10 NOTE — Assessment & Plan Note (Addendum)
Lung function is stable at 40% Stay on symbicort Dc atrovent - add spiriva in future if dyspnea worsens

## 2015-08-10 NOTE — Progress Notes (Signed)
   Subjective:    Patient ID: Matthew Hall, male    DOB: 05-05-1946, 70 y.o.   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   08/10/2015  Chief Complaint  Patient presents with  . Follow-up    patient doing well.  sinus congestion.  Atrovent causing hoarseness in throat.   Last COPD flare in 07/2014 back to baseline  Wife was sick x 88m, Back in pulmonary rehabilitation Stopped  Atrovent nebs - hoarse  CXR 07/2015 BL scarring  Significant tests/ events  Episode of bronchitis 10/2007 with abnormal CXR, CT showed >> RLL & lingular bronchectasis & areas of pleuro-parenchymal scarring. 8 mm low density lesion in liver >> stable on rpt CT7/2009. Incidentally, not seen on CT in 1995.  FEV1 trend '95 59% >> 64% in 09/2007 >> 59% in 03/2008   09/2014  FEv1 40%, ratio 62 07/2015 FEV1 40%    Review of Systems neg for any significant sore throat, dysphagia, itching, sneezing, nasal congestion or excess/ purulent secretions, fever, chills, sweats, unintended wt loss, pleuritic or exertional cp, hempoptysis, orthopnea pnd or change in chronic leg swelling.  Also denies presyncope, palpitations, heartburn, abdominal pain, nausea, vomiting, diarrhea or change in bowel or urinary habits, dysuria,hematuria, rash, arthralgias, visual complaints, headache, numbness weakness or ataxia.     Objective:   Physical Exam  Gen. Pleasant, well-nourished, in no distress ENT - no lesions, no post nasal drip Neck: No JVD, no thyromegaly, no carotid bruits Lungs: no use of accessory muscles, no dullness to percussion, clear without rales or rhonchi  Cardiovascular: Rhythm regular, heart sounds  normal, no murmurs or gallops, no peripheral edema Musculoskeletal: No deformities, no  cyanosis or clubbing        Assessment & Plan:

## 2015-08-10 NOTE — Assessment & Plan Note (Signed)
We discussed symptoms of chest cold/ bronchitis - call as needed

## 2015-08-10 NOTE — Patient Instructions (Signed)
Lung function is stable at 40% Stay on symbicort We discussed symptoms of chest cold/ bronchitis - call as needed

## 2016-02-07 ENCOUNTER — Ambulatory Visit (INDEPENDENT_AMBULATORY_CARE_PROVIDER_SITE_OTHER): Payer: Medicare Other | Admitting: Adult Health

## 2016-02-07 ENCOUNTER — Encounter: Payer: Self-pay | Admitting: Adult Health

## 2016-02-07 DIAGNOSIS — J438 Other emphysema: Secondary | ICD-10-CM | POA: Diagnosis not present

## 2016-02-07 NOTE — Patient Instructions (Signed)
Continue Symbicort, Rinse well after use Follow up with Dr. Elsworth Soho in 6 months

## 2016-02-07 NOTE — Progress Notes (Signed)
Subjective:    Patient ID: Matthew Hall, male    DOB: [birth year], 70 y.o.   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  Significant tests/ events Episode of bronchitis 10/2007 with abnormal CXR, CT showed >> RLL & lingular bronchectasis & areas of pleuro-parenchymal scarring. 8 mm low density lesion in liver >> stable on rpt CT7/2009. Incidentally, not seen on CT in 1995.  FEV1 trend '95 59% >> 64% in 09/2007 >> 59% in 03/2008   09/2014 neb provided  07/2015 Spirometry >> FEv1 40%, ratio 62 Pulmonary rehab in 2016   02/07/2016 Follow up : COPD  [name]  Says he is doing well with his breathing .  On Symbicort Twice daily  . Occasionally misses a dose  No longer taking Atrovent neb.  PVX and Prevnar are utd.  CXR in January 2017 with chronic changes.  Patient denies any chest pain, orthopnea, PND, leg swelling.hemoptysis or wt loss.    Past Medical History:  Diagnosis Date  . Anxiety   . Atrial fibrillation ([place])   . Bladder tumor   . Cancer ([place])    bladder and skin  . COPD (chronic obstructive pulmonary disease) ([place])   . Emphysema/COPD ([place])   . Hypertension   . Kidney stone    Current Outpatient Prescriptions on File Prior to Visit  Medication Sig Dispense Refill  . albuterol (PROVENTIL HFA;VENTOLIN HFA) 108 (90 BASE) MCG/ACT inhaler Inhale 2 puffs into the lungs every 6 (six) hours as needed for wheezing or shortness of breath. 1 Inhaler 6  . ALPRAZolam (XANAX) 0.25 MG tablet Take 0.5 mg by mouth 2 (two) times daily as needed.   3  . aspirin 325 MG tablet Take 325 mg by mouth daily.    . budesonide-formoterol (SYMBICORT) 160-4.5 MCG/ACT inhaler Inhale 2 puffs into the lungs 2 (two) times daily. 1 Inhaler 6  .  hydroxypropyl methylcellulose / hypromellose (ISOPTO TEARS / GONIOVISC) 2.5 % ophthalmic solution Place 1 drop into both eyes as needed for dry eyes.    Marland Kitchen losartan-hydrochlorothiazide (HYZAAR) 50-12.5 MG per tablet Take 0.5 tablets by mouth daily.  9  . metoprolol succinate (TOPROL-XL) 25 MG 24 hr tablet Take 25 mg by mouth 2 (two) times daily.  3  . omeprazole (PRILOSEC) 40 MG capsule Take 40 mg by mouth daily.  3  . simvastatin (ZOCOR) 80 MG tablet Take 80 mg by mouth at bedtime.  3  . triamcinolone (NASACORT) 55 MCG/ACT AERO nasal inhaler Place 1 spray into the nose daily as needed (Congestion).     Marland Kitchen ipratropium (ATROVENT) 0.02 % nebulizer solution Take 2.5 mLs (0.5 mg total) by nebulization 4 (four) times daily. (Patient not taking: Reported on 02/07/2016) 300 mL 2   No current facility-administered medications on file prior to visit.        Review of Systems  neg for any significant sore throat, dysphagia, itching, sneezing, nasal congestion or excess/ purulent secretions, fever, chills, sweats, unintended wt loss, pleuritic or exertional cp, hempoptysis, orthopnea pnd or change in chronic leg swelling. Also denies presyncope, palpitations, heartburn, abdominal pain, nausea, vomiting, diarrhea or change in bowel or urinary habits, dysuria,hematuria, rash, arthralgias, visual complaints, headache, numbness weakness or ataxia.  Objective:   Physical Exam Vitals:   02/07/16 1009  BP: (!) 142/80  Pulse: 76  Temp: 97.6 F (36.4 C)  TempSrc: Oral  SpO2: 96%  Weight: 183 lb (83 kg)  Height: 5\' 11"  (1.803 m)    Gen. Pleasant, well-nourished, in no distress ENT - no lesions, no post nasal drip Neck: No JVD, no thyromegaly, no carotid bruits Lungs: no use of accessory muscles, no dullness to percussion, decreased breath sounds in the bases no rhonchi  Cardiovascular: Rhythm regular, heart sounds  normal, no murmurs or gallops, no peripheral edema Musculoskeletal: No deformities,  no cyanosis or clubbing     Jaskirat Schwieger NP-C  [facility]  02/07/2016

## 2016-02-07 NOTE — Assessment & Plan Note (Signed)
Compensated without flare   Plan Cont on current regimen  

## 2016-02-07 NOTE — Addendum Note (Signed)
Addended by: Osa Craver on: 02/07/2016 03:52 PM   Modules accepted: Orders

## 2016-07-09 ENCOUNTER — Emergency Department (HOSPITAL_COMMUNITY): Payer: Medicare Other

## 2016-07-09 ENCOUNTER — Inpatient Hospital Stay (HOSPITAL_COMMUNITY)
Admission: EM | Admit: 2016-07-09 | Discharge: 2016-07-11 | DRG: 194 | Disposition: A | Discharge status: Home / Self Care | Payer: Medicare Other | Attending: Internal Medicine | Admitting: Internal Medicine

## 2016-07-09 ENCOUNTER — Inpatient Hospital Stay (HOSPITAL_COMMUNITY): Payer: Medicare Other

## 2016-07-09 ENCOUNTER — Encounter (HOSPITAL_COMMUNITY): Payer: Self-pay | Admitting: *Deleted

## 2016-07-09 DIAGNOSIS — Z833 Family history of diabetes mellitus: Secondary | ICD-10-CM | POA: Diagnosis not present

## 2016-07-09 DIAGNOSIS — Z7982 Long term (current) use of aspirin: Secondary | ICD-10-CM | POA: Diagnosis not present

## 2016-07-09 DIAGNOSIS — I482 Chronic atrial fibrillation, unspecified: Secondary | ICD-10-CM | POA: Diagnosis present

## 2016-07-09 DIAGNOSIS — K219 Gastro-esophageal reflux disease without esophagitis: Secondary | ICD-10-CM | POA: Diagnosis present

## 2016-07-09 DIAGNOSIS — Z85828 Personal history of other malignant neoplasm of skin: Secondary | ICD-10-CM

## 2016-07-09 DIAGNOSIS — Z87442 Personal history of urinary calculi: Secondary | ICD-10-CM | POA: Diagnosis not present

## 2016-07-09 DIAGNOSIS — Z825 Family history of asthma and other chronic lower respiratory diseases: Secondary | ICD-10-CM

## 2016-07-09 DIAGNOSIS — I10 Essential (primary) hypertension: Secondary | ICD-10-CM | POA: Diagnosis present

## 2016-07-09 DIAGNOSIS — I509 Heart failure, unspecified: Secondary | ICD-10-CM | POA: Diagnosis not present

## 2016-07-09 DIAGNOSIS — J181 Lobar pneumonia, unspecified organism: Secondary | ICD-10-CM | POA: Diagnosis not present

## 2016-07-09 DIAGNOSIS — Z8551 Personal history of malignant neoplasm of bladder: Secondary | ICD-10-CM

## 2016-07-09 DIAGNOSIS — E785 Hyperlipidemia, unspecified: Secondary | ICD-10-CM | POA: Diagnosis present

## 2016-07-09 DIAGNOSIS — Z87891 Personal history of nicotine dependence: Secondary | ICD-10-CM | POA: Diagnosis not present

## 2016-07-09 DIAGNOSIS — R0602 Shortness of breath: Secondary | ICD-10-CM

## 2016-07-09 DIAGNOSIS — J441 Chronic obstructive pulmonary disease with (acute) exacerbation: Secondary | ICD-10-CM

## 2016-07-09 DIAGNOSIS — R0902 Hypoxemia: Secondary | ICD-10-CM | POA: Diagnosis present

## 2016-07-09 DIAGNOSIS — J440 Chronic obstructive pulmonary disease with acute lower respiratory infection: Secondary | ICD-10-CM | POA: Diagnosis present

## 2016-07-09 DIAGNOSIS — J189 Pneumonia, unspecified organism: Principal | ICD-10-CM | POA: Diagnosis present

## 2016-07-09 DIAGNOSIS — R509 Fever, unspecified: Secondary | ICD-10-CM

## 2016-07-09 DIAGNOSIS — J479 Bronchiectasis, uncomplicated: Secondary | ICD-10-CM

## 2016-07-09 DIAGNOSIS — Z7951 Long term (current) use of inhaled steroids: Secondary | ICD-10-CM

## 2016-07-09 LAB — I-STAT CHEM 8, ED
BUN: 13 mg/dL (ref 6–20)
Calcium, Ion: 1.14 mmol/L — ABNORMAL LOW (ref 1.15–1.40)
Chloride: 97 mmol/L — ABNORMAL LOW (ref 101–111)
Creatinine, Ser: 1 mg/dL (ref 0.61–1.24)
Glucose, Bld: 117 mg/dL — ABNORMAL HIGH (ref 65–99)
HCT: 44 % (ref 39.0–52.0)
Hemoglobin: 15 g/dL (ref 13.0–17.0)
Potassium: 3.6 mmol/L (ref 3.5–5.1)
Sodium: 137 mmol/L (ref 135–145)
TCO2: 27 mmol/L (ref 0–100)

## 2016-07-09 LAB — BASIC METABOLIC PANEL
Anion gap: 9 (ref 5–15)
BUN: 14 mg/dL (ref 6–20)
CO2: 27 mmol/L (ref 22–32)
Calcium: 8.6 mg/dL — ABNORMAL LOW (ref 8.9–10.3)
Chloride: 97 mmol/L — ABNORMAL LOW (ref 101–111)
Creatinine, Ser: 0.95 mg/dL (ref 0.61–1.24)
GFR calc Af Amer: >60 mL/min (ref >60)
GFR calc non Af Amer: >60 mL/min (ref >60)
Glucose, Bld: 116 mg/dL — ABNORMAL HIGH (ref 65–99)
Potassium: 3.6 mmol/L (ref 3.5–5.1)
Sodium: 133 mmol/L — ABNORMAL LOW (ref 135–145)

## 2016-07-09 LAB — URINALYSIS, ROUTINE W REFLEX MICROSCOPIC
Bilirubin Urine: NEGATIVE
Glucose, UA: NEGATIVE mg/dL
Hgb urine dipstick: NEGATIVE
Ketones, ur: NEGATIVE mg/dL
Leukocytes, UA: NEGATIVE
Nitrite: NEGATIVE
Protein, ur: NEGATIVE mg/dL
Specific Gravity, Urine: 1.025 (ref 1.005–1.030)
pH: 5.5 (ref 5.0–8.0)

## 2016-07-09 LAB — I-STAT TROPONIN, ED: Troponin i, poc: 0 ng/mL (ref 0.00–0.08)

## 2016-07-09 LAB — CBC WITH DIFFERENTIAL/PLATELET
Basophils Absolute: 0.1 10*3/uL (ref 0.0–0.1)
Basophils Relative: 0 %
Eosinophils Absolute: 0.3 10*3/uL (ref 0.0–0.7)
Eosinophils Relative: 2 %
HCT: 44.2 % (ref 39.0–52.0)
Hemoglobin: 14.4 g/dL (ref 13.0–17.0)
Lymphocytes Relative: 11 %
Lymphs Abs: 1.2 10*3/uL (ref 0.7–4.0)
MCH: 30.8 pg (ref 26.0–34.0)
MCHC: 32.6 g/dL (ref 30.0–36.0)
MCV: 94.4 fL (ref 78.0–100.0)
Monocytes Absolute: 1.5 10*3/uL — ABNORMAL HIGH (ref 0.1–1.0)
Monocytes Relative: 13 %
Neutro Abs: 8.6 10*3/uL — ABNORMAL HIGH (ref 1.7–7.7)
Neutrophils Relative %: 74 %
Platelets: 224 10*3/uL (ref 150–400)
RBC: 4.68 MIL/uL (ref 4.22–5.81)
RDW: 14.1 % (ref 11.5–15.5)
WBC: 11.6 10*3/uL — ABNORMAL HIGH (ref 4.0–10.5)

## 2016-07-09 LAB — ECHOCARDIOGRAM COMPLETE
Height: 71.5 in
Weight: 2944 oz

## 2016-07-09 LAB — I-STAT CG4 LACTIC ACID, ED: Lactic Acid, Venous: 1.51 mmol/L (ref 0.5–1.9)

## 2016-07-09 LAB — TSH: TSH: 1.446 u[IU]/mL (ref 0.350–4.500)

## 2016-07-09 LAB — INFLUENZA PANEL BY PCR (TYPE A & B)
Influenza A By PCR: NEGATIVE
Influenza B By PCR: NEGATIVE

## 2016-07-09 LAB — MRSA PCR SCREENING: MRSA by PCR: NEGATIVE

## 2016-07-09 LAB — BRAIN NATRIURETIC PEPTIDE: B Natriuretic Peptide: 83 pg/mL (ref 0.0–100.0)

## 2016-07-09 MED ORDER — METOPROLOL TARTRATE 50 MG PO TABS
50.0000 mg | ORAL_TABLET | Freq: Two times a day (BID) | ORAL | Status: DC
  Administered 2016-07-09 (×2): 50 mg via ORAL
  Filled 2016-07-09 (×2): qty 1

## 2016-07-09 MED ORDER — TRIAMCINOLONE ACETONIDE 55 MCG/ACT NA AERO: 1.0000 | INHALATION_SPRAY | Freq: Every day | NASAL | Status: DC | PRN

## 2016-07-09 MED ORDER — LORATADINE 10 MG PO TABS
10.0000 mg | ORAL_TABLET | Freq: Every day | ORAL | Status: DC
  Administered 2016-07-09 – 2016-07-11 (×3): 10 mg via ORAL
  Filled 2016-07-09 (×3): qty 1

## 2016-07-09 MED ORDER — METOPROLOL TARTRATE 50 MG PO TABS: 50.0000 mg | ORAL_TABLET | Freq: Once | ORAL | Status: AC

## 2016-07-09 MED ORDER — ALBUTEROL (5 MG/ML) CONTINUOUS INHALATION SOLN
10.0000 mg/h | INHALATION_SOLUTION | Freq: Once | RESPIRATORY_TRACT | Status: AC
  Administered 2016-07-09: 10 mg/h via RESPIRATORY_TRACT
  Filled 2016-07-09: qty 20

## 2016-07-09 MED ORDER — SODIUM CHLORIDE 0.9 % IV BOLUS (SEPSIS)
1000.0000 mL | Freq: Once | INTRAVENOUS | Status: AC
Start: 2016-07-09 — End: 2016-07-09
  Administered 2016-07-09: 1000 mL via INTRAVENOUS

## 2016-07-09 MED ORDER — LEVOFLOXACIN IN D5W 500 MG/100ML IV SOLN
500.0000 mg | Freq: Once | INTRAVENOUS | Status: DC
  Filled 2016-07-09: qty 100

## 2016-07-09 MED ORDER — ATORVASTATIN CALCIUM 40 MG PO TABS
40.0000 mg | ORAL_TABLET | Freq: Every day | ORAL | Status: DC
  Administered 2016-07-09 – 2016-07-10 (×2): 40 mg via ORAL
  Filled 2016-07-09 (×2): qty 1

## 2016-07-09 MED ORDER — LEVALBUTEROL HCL 0.63 MG/3ML IN NEBU
0.6300 mg | INHALATION_SOLUTION | Freq: Four times a day (QID) | RESPIRATORY_TRACT | Status: DC | PRN
  Administered 2016-07-09 – 2016-07-11 (×4): 0.63 mg via RESPIRATORY_TRACT
  Filled 2016-07-09 (×4): qty 3

## 2016-07-09 MED ORDER — METHYLPREDNISOLONE SODIUM SUCC 125 MG IJ SOLR
60.0000 mg | Freq: Two times a day (BID) | INTRAMUSCULAR | Status: DC
Start: 2016-07-09 — End: 2016-07-10
  Administered 2016-07-09 – 2016-07-10 (×2): 60 mg via INTRAVENOUS
  Filled 2016-07-09 (×2): qty 2

## 2016-07-09 MED ORDER — ACETAMINOPHEN 500 MG PO TABS
1000.0000 mg | ORAL_TABLET | Freq: Once | ORAL | Status: AC
  Administered 2016-07-09: 1000 mg via ORAL
  Filled 2016-07-09: qty 2

## 2016-07-09 MED ORDER — METOPROLOL TARTRATE 50 MG PO TABS
100.0000 mg | ORAL_TABLET | Freq: Two times a day (BID) | ORAL | Status: DC
  Administered 2016-07-10 – 2016-07-11 (×3): 100 mg via ORAL
  Filled 2016-07-09 (×3): qty 2

## 2016-07-09 MED ORDER — APIXABAN 5 MG PO TABS
5.0000 mg | ORAL_TABLET | Freq: Two times a day (BID) | ORAL | Status: DC
  Filled 2016-07-09: qty 1

## 2016-07-09 MED ORDER — SODIUM CHLORIDE 0.9 % IV SOLN
INTRAVENOUS | Status: DC
  Administered 2016-07-09: 11:00:00 via INTRAVENOUS

## 2016-07-09 MED ORDER — ACETAMINOPHEN 325 MG PO TABS
650.0000 mg | ORAL_TABLET | Freq: Four times a day (QID) | ORAL | Status: DC | PRN
  Administered 2016-07-09: 650 mg via ORAL
  Filled 2016-07-09: qty 2

## 2016-07-09 MED ORDER — SODIUM CHLORIDE 0.9 % IV SOLN: INTRAVENOUS | Status: DC

## 2016-07-09 MED ORDER — MAGNESIUM CITRATE PO SOLN: 1.0000 | Freq: Once | ORAL | Status: DC | PRN

## 2016-07-09 MED ORDER — METOPROLOL TARTRATE 5 MG/5ML IV SOLN
5.0000 mg | INTRAVENOUS | Status: DC | PRN
  Administered 2016-07-09: 5 mg via INTRAVENOUS
  Filled 2016-07-09: qty 5

## 2016-07-09 MED ORDER — LEVOFLOXACIN IN D5W 750 MG/150ML IV SOLN
750.0000 mg | Freq: Once | INTRAVENOUS | Status: AC
  Administered 2016-07-09: 750 mg via INTRAVENOUS
  Filled 2016-07-09: qty 150

## 2016-07-09 MED ORDER — ONDANSETRON HCL 4 MG/2ML IJ SOLN: 4.0000 mg | Freq: Four times a day (QID) | INTRAMUSCULAR | Status: DC | PRN

## 2016-07-09 MED ORDER — ALPRAZOLAM 0.25 MG PO TABS
0.2500 mg | ORAL_TABLET | Freq: Two times a day (BID) | ORAL | Status: DC | PRN
  Administered 2016-07-09 – 2016-07-11 (×4): 0.25 mg via ORAL
  Filled 2016-07-09 (×4): qty 1

## 2016-07-09 MED ORDER — ACETAMINOPHEN 650 MG RE SUPP: 650.0000 mg | Freq: Four times a day (QID) | RECTAL | Status: DC | PRN

## 2016-07-09 MED ORDER — PANTOPRAZOLE SODIUM 40 MG PO TBEC
40.0000 mg | DELAYED_RELEASE_TABLET | Freq: Every day | ORAL | Status: DC
Start: 2016-07-09 — End: 2016-07-11
  Administered 2016-07-09 – 2016-07-11 (×3): 40 mg via ORAL
  Filled 2016-07-09 (×3): qty 1

## 2016-07-09 MED ORDER — TIOTROPIUM BROMIDE MONOHYDRATE 18 MCG IN CAPS
18.0000 ug | ORAL_CAPSULE | Freq: Every day | RESPIRATORY_TRACT | Status: DC
  Administered 2016-07-09 – 2016-07-11 (×3): 18 ug via RESPIRATORY_TRACT
  Filled 2016-07-09: qty 5

## 2016-07-09 MED ORDER — ONDANSETRON HCL 4 MG PO TABS: 4.0000 mg | ORAL_TABLET | Freq: Four times a day (QID) | ORAL | Status: DC | PRN

## 2016-07-09 MED ORDER — METHYLPREDNISOLONE SODIUM SUCC 125 MG IJ SOLR
125.0000 mg | Freq: Once | INTRAMUSCULAR | Status: AC
  Administered 2016-07-09: 125 mg via INTRAVENOUS
  Filled 2016-07-09: qty 2

## 2016-07-09 MED ORDER — POLYVINYL ALCOHOL 1.4 % OP SOLN
1.0000 [drp] | OPHTHALMIC | Status: DC | PRN
  Filled 2016-07-09: qty 15

## 2016-07-09 MED ORDER — HYDRALAZINE HCL 20 MG/ML IJ SOLN: 10.0000 mg | Freq: Four times a day (QID) | INTRAMUSCULAR | Status: DC | PRN

## 2016-07-09 MED ORDER — HYPROMELLOSE (GONIOSCOPIC) 2.5 % OP SOLN: 1.0000 [drp] | OPHTHALMIC | Status: DC | PRN

## 2016-07-09 MED ORDER — MOMETASONE FURO-FORMOTEROL FUM 200-5 MCG/ACT IN AERO
2.0000 | INHALATION_SPRAY | Freq: Two times a day (BID) | RESPIRATORY_TRACT | Status: DC
  Administered 2016-07-09 – 2016-07-11 (×5): 2 via RESPIRATORY_TRACT
  Filled 2016-07-09: qty 8.8

## 2016-07-09 MED ORDER — FLUTICASONE PROPIONATE 50 MCG/ACT NA SUSP: 1.0000 | Freq: Every day | NASAL | Status: DC | PRN

## 2016-07-09 MED ORDER — LEVOFLOXACIN IN D5W 750 MG/150ML IV SOLN
750.0000 mg | INTRAVENOUS | Status: DC
  Administered 2016-07-10 – 2016-07-11 (×2): 750 mg via INTRAVENOUS
  Filled 2016-07-09 (×2): qty 150

## 2016-07-09 NOTE — ED Notes (Signed)
Pt's HR noted to be 140s.  Dr. Leonides Schanz is aware and states is result of albuterol.  Pt has hx of same.

## 2016-07-09 NOTE — Discharge Instructions (Addendum)
Follow with Primary MD [name], MD and your Cardiologist in 7 days discuss anticoagulation for Afib.  Get CBC, CMP, 2 view Chest X ray checked  by Primary MD or SNF MD in 5-7 days ( we routinely change or add medications that can affect your baseline labs and fluid status, therefore we recommend that you get the mentioned basic workup next visit with your PCP, your PCP may decide not to get them or add new tests based on their clinical decision)   Activity: As tolerated with Full fall precautions use walker/cane & assistance as needed   Disposition Home     Diet:  Heart Healthy   For Heart failure patients - Check your Weight same time everyday, if you gain over 2 pounds, or you develop in leg swelling, experience more shortness of breath or chest pain, call your Primary MD immediately. Follow Cardiac Low Salt Diet and 1.5 lit/day fluid restriction.   On your next visit with your primary care physician please Get Medicines reviewed and adjusted.   Please request your Prim.MD to go over all Hospital Tests and Procedure/Radiological results at the follow up, please get all Hospital records sent to your Prim MD by signing hospital release before you go home.   If you experience worsening of your admission symptoms, develop shortness of breath, life threatening emergency, suicidal or homicidal thoughts you must seek medical attention immediately by calling 911 or calling your MD immediately  if symptoms less severe.  You Must read complete instructions/literature along with all the possible adverse reactions/side effects for all the Medicines you take and that have been prescribed to you. Take any new Medicines after you have completely understood and accpet all the possible adverse reactions/side effects.   Do not drive, operate heavy machinery, perform activities at heights, swimming or participation in water activities or provide baby sitting services if your were admitted for  syncope or siezures until you have seen by Primary MD or a Neurologist and advised to do so again.  Do not drive when taking Pain medications.    Do not take more than prescribed Pain, Sleep and Anxiety Medications  Special Instructions: If you have smoked or chewed Tobacco  in the last 2 yrs please stop smoking, stop any regular Alcohol  and or any Recreational drug use.  Wear Seat belts while driving.   Please note  You were cared for by a hospitalist during your hospital stay. If you have any questions about your discharge medications or the care you received while you were in the hospital after you are discharged, you can call the unit and asked to speak with the hospitalist on call if the hospitalist that took care of you is not available. Once you are discharged, your primary care physician will handle any further medical issues. Please note that NO REFILLS for any discharge medications will be authorized once you are discharged, as it is imperative that you return to your primary care physician (or establish a relationship with a primary care physician if you do not have one) for your aftercare needs so that they can reassess your need for medications and monitor your lab values.       Information on my medicine - ELIQUIS (apixaban)  This medication education was reviewed with me or my healthcare representative as part of my discharge preparation.  Why was Eliquis prescribed for you? Eliquis was prescribed for you to reduce the risk of a blood clot forming that can cause a  stroke if you have a medical condition called atrial fibrillation (a type of irregular heartbeat).  What do You need to know about Eliquis ? Take your Eliquis TWICE DAILY - one tablet in the morning and one tablet in the evening with or without food. If you have difficulty swallowing the tablet whole please discuss with your pharmacist how to take the medication safely.  Take Eliquis exactly as  prescribed by your doctor and DO NOT stop taking Eliquis without talking to the doctor who prescribed the medication.  Stopping may increase your risk of developing a stroke.  Refill your prescription before you run out.  After discharge, you should have regular check-up appointments with your healthcare provider that is prescribing your Eliquis.  In the future your dose may need to be changed if your kidney function or weight changes by a significant amount or as you get older.  What do you do if you miss a dose? If you miss a dose, take it as soon as you remember on the same day and resume taking twice daily.  Do not take more than one dose of ELIQUIS at the same time to make up a missed dose.  Important Safety Information A possible side effect of Eliquis is bleeding. You should call your healthcare provider right away if you experience any of the following: ? Bleeding from an injury or your nose that does not stop. ? Unusual colored urine (red or dark brown) or unusual colored stools (red or black). ? Unusual bruising for unknown reasons. ? A serious fall or if you hit your head (even if there is no bleeding).  Some medicines may interact with Eliquis and might increase your risk of bleeding or clotting while on Eliquis. To help avoid this, consult your healthcare provider or pharmacist prior to using any new prescription or non-prescription medications, including herbals, vitamins, non-steroidal anti-inflammatory drugs (NSAIDs) and supplements.  This website has more information on Eliquis (apixaban): http://www.eliquis.com/eliquis/home

## 2016-07-09 NOTE — Progress Notes (Signed)
  Echocardiogram 2D Echocardiogram has been performed.  Matthew Hall 07/09/2016, 3:48 PM

## 2016-07-09 NOTE — ED Provider Notes (Signed)
6TIME SEEN: 4:40 AM  CHIEF COMPLAINT: Cough, shortness of breath, chest tightness  HPI: Pt is a 70 y.o. male with history of COPD, hypertension, atrial fibrillation not on anticoagulation who presents to the emergency department with complaints of subjective fever, chills, productive cough with yellow/green sputum production, wheezing, shortness of breath and chest tightness that started 5-6 days ago.  Was seen by his primary care physician on 07/04/16 and was started on doxycycline which he does not feel has been helping him. He does not have an albuterol inhaler at home. Has Symbicort but has not been using it. Is not currently on steroids. Denies vomiting, diarrhea. Wife had similar symptoms. He reports he did have a flu vaccination this year and has had a pneumonia vaccination in the past. He does not wear oxygen at home.   He recently had a lesion to his right nose removed as he has a history of recurrent skin cancer. Has been applying Neosporin to this area. No drainage or increasing pain from this area.  ROS: See HPI Constitutional: Subjective fever  Eyes: no drainage  ENT: no runny nose   Cardiovascular:   chest pain  Resp:  SOB  GI: no vomiting GU: no dysuria Integumentary: no rash  Allergy: no hives  Musculoskeletal: no leg swelling  Neurological: no slurred speech ROS otherwise negative  PAST MEDICAL HISTORY/PAST SURGICAL HISTORY:  Past Medical History:  Diagnosis Date  . Anxiety   . Atrial fibrillation ([place])   . Bladder tumor   . Cancer ([place])    bladder and skin  . COPD (chronic obstructive pulmonary disease) ([place])   . Emphysema/COPD ([place])   . Hypertension   . Kidney stone     MEDICATIONS:  Prior to Admission medications   Medication Sig Start Date End Date Taking? Authorizing Provider  albuterol (PROVENTIL HFA;VENTOLIN HFA) 108 (90 BASE) MCG/ACT inhaler Inhale 2 puffs into the lungs every 6 (six) hours as needed for wheezing or shortness of breath. 08/03/14    Rigoberto Noel, MD  ALPRAZolam Duanne Moron) 0.25 MG tablet Take 0.5 mg by mouth 2 (two) times daily as needed.  07/01/14   Historical Provider, MD  aspirin 325 MG tablet Take 325 mg by mouth daily.    Historical Provider, MD  budesonide-formoterol (SYMBICORT) 160-4.5 MCG/ACT inhaler Inhale 2 puffs into the lungs 2 (two) times daily. 12/01/14   Rigoberto Noel, MD  hydroxypropyl methylcellulose / hypromellose (ISOPTO TEARS / GONIOVISC) 2.5 % ophthalmic solution Place 1 drop into both eyes as needed for dry eyes.    Historical Provider, MD  losartan-hydrochlorothiazide (HYZAAR) 50-12.5 MG per tablet Take 0.5 tablets by mouth daily. 07/04/14   Historical Provider, MD  metoprolol succinate (TOPROL-XL) 25 MG 24 hr tablet Take 25 mg by mouth 2 (two) times daily. 07/16/14   Historical Provider, MD  omeprazole (PRILOSEC) 40 MG capsule Take 40 mg by mouth daily. 12/28/14   Historical Provider, MD  simvastatin (ZOCOR) 80 MG tablet Take 80 mg by mouth at bedtime. 07/18/14   Historical Provider, MD  triamcinolone (NASACORT) 55 MCG/ACT AERO nasal inhaler Place 1 spray into the nose daily as needed (Congestion).     Historical Provider, MD    ALLERGIES:  Allergies  Allergen Reactions  . Augmentin [Amoxicillin-Pot Clavulanate] Rash  . Ciprofloxacin Rash  . Corticosteroids Rash  . Latex Hives and Rash    SOCIAL HISTORY:  Social History  Substance Use Topics  . Smoking status: Former Smoker    Packs/day: 1.00  Years: 30.00    Types: Cigarettes    Quit date: 07/07/2011  . Smokeless tobacco: Never Used  . Alcohol use No    FAMILY HISTORY: Family History  Problem Relation Age of Onset  . Emphysema Father   . Cancer Brother     lung, liver  . Diabetes Mother   . Diabetes Sister     EXAM: BP 165/90   Pulse 98   Temp 99.3 F (37.4 C) (Oral)   Resp 20   Ht 5' 11.5" (1.816 m)   Wt 184 lb (83.5 kg)   SpO2 (!) 89%   BMI 25.31 kg/m  CONSTITUTIONAL: Alert and oriented and responds appropriately to  questions. Appears uncomfortable, elderly, chronically ill-appearing HEAD: Normocephalic EYES: Conjunctivae clear, PERRL, EOMI ENT: normal nose; no rhinorrhea; moist mucous membranes; 3 x 3 cm beefy red area to the right lateral nose with granuloma tissue without purulent drainage or surrounding erythema or warmth NECK: Supple, no meningismus, no nuchal rigidity, no LAD; no JVD CARD: Irregularly irregular; S1 and S2 appreciated; no murmurs, no clicks, no rubs, no gallops RESP: Normal chest excursion without splinting, mild tachypnea with talking, hypoxic on room air to 89% when sitting upright in the bed talking, placed on 2 L nasal cannula and doing well with sats in the mid 90s, no significant respiratory distress but does have diffuse expiratory wheezing and diminished aerationis, no rhonchi or rales, speaking short sentences ABD/GI: Normal bowel sounds; non-distended; soft, non-tender, no rebound, no guarding, no peritoneal signs, no hepatosplenomegaly BACK:  The back appears normal and is non-tender to palpation, there is no CVA tenderness EXT: Normal ROM in all joints; non-tender to palpation; no edema; normal capillary refill; no cyanosis, no calf tenderness or swelling    SKIN: Normal color for age and race; warm; no rash NEURO: Moves all extremities equally, sensation to light touch intact diffusely, cranial nerves II through XII intact, normal speech, Normal gait with no respiratory distress when walking down the hallway PSYCH: The patient's mood and manner are appropriate. Grooming and personal hygiene are appropriate.  MEDICAL DECISION MAKING: Patient here with complaints of cough, shortness of breath, chest pain and subjective fever. Differential diagnosis includes viral illness, pneumonia, COPD exacerbation. Less likely ACS but will obtain EKG and troponin. Will obtain chest x-ray. Will give continuous albuterol, Solu-Medrol. Patient does have a new oxygen requirement and may need  admission to the hospital.  Patient does have an oral temperature here of 99.3 and is intermittently tachycardic in the low 100s. Will obtain a rectal temperature, lactate, blood cultures as well. Flu swab also pending.  ED PROGRESS: 5:40 AM  Pt's rectal temperature is 101.5. Will give oral Tylenol. He states that Levaquin has helped him significantly in the past and is requesting the same. Has a listed allergy to Cipro but states he has taken Levaquin in the past without difficulty. His wife reports she has had similar symptoms previously the patient and she was given Levaquin and felt better within 24 hours. We'll give him a dose of IV Levaquin here as a suspect possible community-acquired pneumonia and he has now failed outpatient treatment with doxycycline. We'll give IV fluids. At this time he has not hypotensive.  6:35 AM  Pt's breath sounds have improved he still has some expiratory wheezing on exam. He is now tachycardic in the 130s in atrial fibrillation which is chronic for him this is likely because he has been getting albuterol. He presented in the emergency department with  a heart rate in the 90s to low 100s. He had a leukocytosis with left shift. Troponin negative. BNP normal. Lactate normal. Flu swab pending. Chest x-ray shows left-sided layering pleural effusion with underlying consolidation. He has received IV Levaquin. Discussed with hospitalist, Dr. Hal Hope, for admission for community-acquired pneumonia has failed outpatient treatment with hypoxia and COPD exacerbation. Patient and family have been updated with this plan and are comfortable with admission.  I will place admission orders to telemetry, inpatient bed per hospitalist request.   I reviewed all nursing notes, vitals, pertinent old records, EKGs, labs, imaging (as available).    EKG Interpretation  Date/Time:  Sunday July 09 2016 05:25:48 EST Ventricular Rate:  93 PR Interval:    QRS Duration: 133 QT  Interval:  387 QTC Calculation: 492 R Axis:   -67 Text Interpretation:  Atrial fibrillation Nonspecific IVCD with LAD Anterior infarct, old Nonspecific T abnormalities, lateral leads Baseline wander in lead(s) I II aVR V4 No significant change since last tracing Confirmed by Verlan Grotz,  DO, Nour Rodrigues [phone]) on 07/09/2016 5:29:52 AM         [place], DO 07/09/16 [id number]

## 2016-07-09 NOTE — H&P (Signed)
TRH H&P   Patient Demographics:    Trad Timpe, is a 70 y.o. male  MRN: [id number]   DOB - Sep 03, 1945  Admit Date - 07/09/2016  Outpatient Primary MD for the patient is [name], MD  Outpatient Specialists: Cardiologist in [place] Dr. Alroy Dust    Patient coming from: Home  Chief Complaint  Patient presents with  . Cough      HPI:    Abdikadir Whybrew  is a 70 y.o. male, With history of chronic atrial fibrillation [place] vasc 2 score of 2, COPD, remote history of smoking, bladder cancer, hypertension who lives at home comes to the hospital with 2-3 day history of productive cough, wheezing and shortness of breath. Subjective low-grade fevers. No recent travel or long drive, did get exposed to his wife who is been carrying a upper respiratory infection for the last few days. Came to the ER where he was diagnosed with pneumonia along with A. fib with RVR and I was called to admit.    Review of systems:    In addition to the HPI above,   No Fever-chills, No Headache, No changes with Vision or hearing, No problems swallowing food or Liquids, No Chest pain, As above Cough & Shortness of Breath, No Abdominal pain, No Nausea or Vommitting, Bowel movements are regular, No Blood in stool or Urine, No dysuria, No new skin rashes or bruises, No new joints pains-aches,  No new weakness, tingling, numbness in any extremity, No recent weight gain or loss, No polyuria, polydypsia or polyphagia, No significant Mental Stressors.  A full 10 point Review of Systems was done, except as stated above, all other Review of Systems were negative.   With Past History of the following :    Past Medical History:    Diagnosis Date  . Anxiety   . Atrial fibrillation ([place])   . Bladder tumor   . Cancer ([place])    bladder and skin  . COPD (chronic obstructive pulmonary disease) ([place])   . Emphysema/COPD ([place])   . Hypertension   . Kidney stone       Past Surgical History:  Procedure Laterality Date  . bladder tumor removed    . Kidney stones removed        Social History:     Social History  Substance Use Topics  .  Smoking status: Former Smoker    Packs/day: 1.00    Years: 30.00    Types: Cigarettes    Quit date: 07/07/2011  . Smokeless tobacco: Never Used  . Alcohol use No         Family History :     Family History  Problem Relation Age of Onset  . Emphysema Father   . Cancer Brother     lung, liver  . Diabetes Mother   . Diabetes Sister        Home Medications:   Prior to Admission medications   Medication Sig Start Date End Date Taking? Authorizing Provider  albuterol (PROVENTIL HFA;VENTOLIN HFA) 108 (90 BASE) MCG/ACT inhaler Inhale 2 puffs into the lungs every 6 (six) hours as needed for wheezing or shortness of breath. 08/03/14  Yes Rigoberto Noel, MD  ALPRAZolam Duanne Moron) 0.25 MG tablet Take 0.25 mg by mouth 2 (two) times daily as needed.  07/01/14  Yes Historical Provider, MD  aspirin 325 MG tablet Take 325 mg by mouth daily.   Yes Historical Provider, MD  cetirizine (ZYRTEC) 10 MG tablet Take 5 mg by mouth daily.   Yes Historical Provider, MD  hydroxypropyl methylcellulose / hypromellose (ISOPTO TEARS / GONIOVISC) 2.5 % ophthalmic solution Place 1 drop into both eyes as needed for dry eyes.   Yes Historical Provider, MD  losartan-hydrochlorothiazide (HYZAAR) 50-12.5 MG per tablet Take 1 tablet by mouth daily.  07/04/14  Yes Historical Provider, MD  metoprolol succinate (TOPROL-XL) 25 MG 24 hr tablet Take 25 mg by mouth 2 (two) times daily. 07/16/14  Yes Historical Provider, MD  omeprazole (PRILOSEC) 40 MG capsule Take 40 mg by mouth daily. 12/28/14  Yes Historical Provider,  MD  simvastatin (ZOCOR) 80 MG tablet Take 40 mg by mouth at bedtime.  07/18/14  Yes Historical Provider, MD  triamcinolone (NASACORT) 55 MCG/ACT AERO nasal inhaler Place 1 spray into the nose daily as needed (Congestion).    Yes Historical Provider, MD  budesonide-formoterol (SYMBICORT) 160-4.5 MCG/ACT inhaler Inhale 2 puffs into the lungs 2 (two) times daily. 12/01/14   Rigoberto Noel, MD     Allergies:     Allergies  Allergen Reactions  . Augmentin [Amoxicillin-Pot Clavulanate] Rash  . Ciprofloxacin Rash  . Corticosteroids Rash  . Latex Hives and Rash     Physical Exam:   Vitals  Blood pressure 118/63, pulse 96, temperature 98.6 F (37 C), temperature source Oral, resp. rate 19, height 5' 11.5" (1.816 m), weight 83.5 kg (184 lb), SpO2 96 %.   1. General middle aged white male lying in bed in NAD,     2. Normal affect and insight, Not Suicidal or Homicidal, Awake Alert, Oriented X 3.  3. No F.N deficits, ALL C.Nerves Intact, Strength 5/5 all 4 extremities, Sensation intact all 4 extremities, Plantars down going.  4. Ears and Eyes appear Normal, Conjunctivae clear, PERRLA. Moist Oral Mucosa.  5. Supple Neck, No JVD, No cervical lymphadenopathy appriciated, No Carotid Bruits.  6. Symmetrical Chest wall movement, Good air movement bilaterally, few rales and wheezes  7. iRRR, No Gallops, Rubs or Murmurs, No Parasternal Heave.  8. Positive Bowel Sounds, Abdomen Soft, No tenderness, No organomegaly appriciated,No rebound -guarding or rigidity.  9.  No Cyanosis, Normal Skin Turgor, No Skin Rash or Bruise.  10. Good muscle tone,  joints appear normal , no effusions, Normal ROM.  11. No Palpable Lymph Nodes in Neck or Axillae      Data Review:  CBC  Recent Labs Lab 07/09/16 0517 07/09/16 0540  WBC 11.6*  --   HGB 14.4 15.0  HCT 44.2 44.0  PLT 224  --   MCV 94.4  --   MCH 30.8  --   MCHC 32.6  --   RDW 14.1  --   LYMPHSABS 1.2  --   MONOABS 1.5*  --   EOSABS  0.3  --   BASOSABS 0.1  --    ------------------------------------------------------------------------------------------------------------------  Chemistries   Recent Labs Lab 07/09/16 0517 07/09/16 0540  NA 133* 137  K 3.6 3.6  CL 97* 97*  CO2 27  --   GLUCOSE 116* 117*  BUN 14 13  CREATININE 0.95 1.00  CALCIUM 8.6*  --    ------------------------------------------------------------------------------------------------------------------ estimated creatinine clearance is 72.3 mL/min (by C-G formula based on SCr of 1 mg/dL). ------------------------------------------------------------------------------------------------------------------  Recent Labs  07/09/16 0519  TSH 1.446    Coagulation profile No results for input(s): INR, PROTIME in the last 168 hours. ------------------------------------------------------------------------------------------------------------------- No results for input(s): DDIMER in the last 72 hours. -------------------------------------------------------------------------------------------------------------------  Cardiac Enzymes No results for input(s): CKMB, TROPONINI, MYOGLOBIN in the last 168 hours.  Invalid input(s): CK ------------------------------------------------------------------------------------------------------------------    Component Value Date/Time   BNP 83.0 07/09/2016 0517     ---------------------------------------------------------------------------------------------------------------  Urinalysis    Component Value Date/Time   COLORURINE YELLOW 07/09/2016 [place] 07/09/2016 0643   LABSPEC 1.025 07/09/2016 0643   PHURINE 5.5 07/09/2016 [place] 07/09/2016 0643   HGBUR NEGATIVE 07/09/2016 [place] NEGATIVE 07/09/2016 [place] 07/09/2016 0643   PROTEINUR NEGATIVE 07/09/2016 0643   NITRITE NEGATIVE 07/09/2016 0643   LEUKOCYTESUR NEGATIVE 07/09/2016 0643      ----------------------------------------------------------------------------------------------------------------   Imaging Results:    Dg Chest Portable 1 View  Result Date: 07/09/2016 CLINICAL DATA:  Shortness of breath, productive cough, wheezing for 3-4 days. History of COPD, bladder and skin cancer. EXAM: PORTABLE CHEST 1 VIEW COMPARISON:  Chest radiograph July 24, 2015 FINDINGS: The cardiac silhouette is mildly enlarged and unchanged. Mediastinal silhouette is nonsuspicious. Moderate likely layering LEFT pleural effusion with underlying consolidation. RIGHT lung is clear. No pneumothorax. Soft tissue planes and included osseous structures are nonsuspicious. IMPRESSION: Moderate suspected layering LEFT pleural effusion with underlying consolidation. Recommend follow-up PA and lateral views of the chest when clinically able. Mild cardiomegaly. Electronically Signed   By: Elon Alas M.D.   On: 07/09/2016 06:28    My personal review of EKG: Rhythm Afib with IVCD, Rate  140s /min, non specific ST changes   Assessment & Plan:     1. Community-acquired pneumonia. Likely mild COPD exacerbation as well. Admit to the hospital, place on empiric antibiotics which will be Levaquin, follow blood and sputum cultures, oxygen and nebulizer treatments as needed. Low-dose steroids for now.  2. Chronic A. fib with RVR, [place] vasc 2 score of 2 - given IV Lopressor in the ER along with oral Lopressor at a higher than home dose, place on Eliquis, if rate is not controlled will place him on IV Esmalol. Monitor and stepdown. TSH is stable. Will check echocardiogram.  3. Essential hypertension. For now beta blocker and monitor. As needed IV hydralazine. Holding Ace inhibitors to provide room for a higher dose beta blocker.  4. GERD. On PPI.   DVT Prophylaxis Eliquis  AM Labs Ordered, also please review Full Orders  Family Communication: Admission, patients condition and plan of care including  tests being  ordered have been discussed with the patient and wife who indicate understanding and agree with the plan and Code Status.  Code Status Full  Likely DC to  Home 1-2 days  Condition GUARDED    Consults called: None    Admission status: Inpt    Time spent in minutes : 35   Lala Lund K M.D on 07/09/2016 at 10:12 AM  Between 7am to 7pm - Pager - [phone]. After 7pm go to [email or web address] - password [email or web address]  [facility] - Office  [phone]

## 2016-07-09 NOTE — ED Triage Notes (Addendum)
Pt c/o cough for the past 3-4 days, was started on doxycycline with no improvement in symptoms, admits to cough with green to clear sputum production, pt also c/o wheezing as well,

## 2016-07-09 NOTE — ED Notes (Signed)
Dr Candiss Norse at bedside evaluating pt.  Medicated as ordered for BP and HR.

## 2016-07-09 NOTE — ED Notes (Signed)
EKG seen by Dr Leonides Schanz

## 2016-07-09 NOTE — Plan of Care (Signed)
70 year old male with history of COPD hypertension atrial fibrillation presents with subjective feeling of fever chills shortness of breath and cough. Chest x-ray shows pneumonia with pleural effusion. Patient admitted for pneumonia management.  Matthew Hall.

## 2016-07-10 ENCOUNTER — Inpatient Hospital Stay (HOSPITAL_COMMUNITY): Payer: Medicare Other

## 2016-07-10 LAB — BASIC METABOLIC PANEL
Anion gap: 6 (ref 5–15)
BUN: 15 mg/dL (ref 6–20)
CO2: 26 mmol/L (ref 22–32)
Calcium: 8.4 mg/dL — ABNORMAL LOW (ref 8.9–10.3)
Chloride: 104 mmol/L (ref 101–111)
Creatinine, Ser: 0.83 mg/dL (ref 0.61–1.24)
GFR calc Af Amer: >60 mL/min (ref >60)
GFR calc non Af Amer: >60 mL/min (ref >60)
Glucose, Bld: 162 mg/dL — ABNORMAL HIGH (ref 65–99)
Potassium: 4.1 mmol/L (ref 3.5–5.1)
Sodium: 136 mmol/L (ref 135–145)

## 2016-07-10 LAB — CBC
HCT: 40.2 % (ref 39.0–52.0)
Hemoglobin: 12.9 g/dL — ABNORMAL LOW (ref 13.0–17.0)
MCH: 30 pg (ref 26.0–34.0)
MCHC: 32.1 g/dL (ref 30.0–36.0)
MCV: 93.5 fL (ref 78.0–100.0)
Platelets: 218 10*3/uL (ref 150–400)
RBC: 4.3 MIL/uL (ref 4.22–5.81)
RDW: 14 % (ref 11.5–15.5)
WBC: 17.3 10*3/uL — ABNORMAL HIGH (ref 4.0–10.5)

## 2016-07-10 NOTE — Progress Notes (Signed)
Patient refuses to take Eliquis.  Patient would prefer to take Coumadin due to the fact that he has taken Coumadin before without complications.  Patient will discuss concerns with cardiologist tomorrow, January 2, during consult.

## 2016-07-10 NOTE — Progress Notes (Signed)
Pt going to [facility] & report given to [employer]. Pt transported via wheelchair

## 2016-07-10 NOTE — Progress Notes (Signed)
PT IS REFUSING TO TAKE ELIQUIS. PT WANTS TO TALK W/ MD AND PHARMASIST ABOUT TAKING COUMADIN INSTEAD.

## 2016-07-10 NOTE — Progress Notes (Signed)
PROGRESS NOTE                                                                                                                                                                                                             Patient Demographics:    Matthew Hall, is a 71 y.o. male, DOB - 04-27-1946, [id number]  Admit date - 07/09/2016   Admitting Physician Thurnell Lose, MD  Outpatient Primary MD for the patient is [name], MD  LOS - 1  Chief Complaint  Patient presents with  . Cough       Brief Narrative   Matthew Hall  is a 71 y.o. male, With history of chronic atrial fibrillation [place] vasc 2 score of 2, COPD, remote history of smoking, bladder cancer, hypertension who lives at home comes to the hospital with 2-3 day history of productive cough, wheezing and shortness of breath. Subjective low-grade fevers. No recent travel or long drive, did get exposed to his wife who is been carrying a upper respiratory infection for the last few days. Came to the ER where he was diagnosed with pneumonia along with A. fib with RVR and I was called to admit.    Subjective:    Stevphen Bacigalupi today has, No headache, No chest pain, No abdominal pain - No Nausea, No new weakness tingling or numbness, No Cough - SOB.     Assessment  & Plan :     1. Community-acquired pneumonia. ? mild COPD exacerbation as well. He is already improved on empiric antibiotic Levaquin, follow blood and sputum cultures, continue oxygen and nebulizer treatments as needed. No wheezing doubt any COPD exacerbation we'll stop IV steroids, increase activity and monitor. Likely discharge in the morning if better.  2. Chronic A. fib with RVR, [place] vasc 2 score of 2 - on Lopressor which is higher than home dose, currently in great control, placed on Eliquis, patient counseled on use. TSH and echo stable.  3. Essential hypertension. For now beta  blocker and monitor. As needed IV hydralazine. Holding Ace inhibitors to provide room for a higher dose beta blocker.  4. GERD. On PPI.  5. Dyslipidemia. On statin continue.    Family Communication  :  Wife on the day of admission  Code Status :  Full  Diet : Diet Heart Room service appropriate?  Yes; Fluid consistency: Thin    Disposition Plan  :  Tele  Consults  :  None  Procedures  :    TTE - Left ventricle: The cavity size was normal. Wall thickness was increased in a pattern of mild LVH. Systolic function was vigorous. The estimated ejection fraction was in the range of 65% to 70%. Wall motion was normal; there were no regional wall motion abnormalities. The study is not technically sufficient to   allow evaluation of LV diastolic function. - Mitral valve: Calcified annulus. - Left atrium: The atrium was moderately dilated. - Right atrium: The atrium was moderately dilated.   DVT Prophylaxis  :  Eliquis - patient counseled  Lab Results  Component Value Date   PLT 218 07/10/2016    Inpatient Medications  Scheduled Meds: . apixaban  5 mg Oral BID  . atorvastatin  40 mg Oral q1800  . levofloxacin (LEVAQUIN) IV  750 mg Intravenous Q24H  . loratadine  10 mg Oral Daily  . metoprolol tartrate  100 mg Oral BID  . mometasone-formoterol  2 puff Inhalation BID  . pantoprazole  40 mg Oral Daily  . tiotropium  18 mcg Inhalation Daily   Continuous Infusions: PRN Meds:.acetaminophen **OR** [DISCONTINUED] acetaminophen, ALPRAZolam, fluticasone, hydrALAZINE, levalbuterol, magnesium citrate, metoprolol, ondansetron **OR** ondansetron (ZOFRAN) IV, polyvinyl alcohol  Antibiotics  :    Anti-infectives    Start     Dose/Rate Route Frequency Ordered Stop   07/09/16 0900  levofloxacin (LEVAQUIN) IVPB 750 mg     750 mg 100 mL/hr over 90 Minutes Intravenous Every 24 hours 07/09/16 0836     07/09/16 0545  levofloxacin (LEVAQUIN) IVPB 500 mg  Status:  Discontinued     500 mg 100  mL/hr over 60 Minutes Intravenous  Once 07/09/16 0541 07/09/16 0544   07/09/16 0545  levofloxacin (LEVAQUIN) IVPB 750 mg     750 mg 100 mL/hr over 90 Minutes Intravenous  Once 07/09/16 0544 07/09/16 0733         Objective:   Vitals:   07/10/16 0600 07/10/16 0802 07/10/16 0805 07/10/16 0944  BP: 121/66   (!) 122/58  Pulse: 74   82  Resp: 18     Temp:  97.9 F (36.6 C)    TempSrc:  Oral    SpO2: 93%  92%   Weight:      Height:        Wt Readings from Last 3 Encounters:  07/10/16 82.7 kg (182 lb 5.1 oz)  02/07/16 83 kg (183 lb)  08/10/15 83.7 kg (184 lb 9.6 oz)     Intake/Output Summary (Last 24 hours) at 07/10/16 1002 Last data filed at 07/10/16 0944  Gross per 24 hour  Intake          1373.75 ml  Output             1500 ml  Net          -126.25 ml     Physical Exam  Awake Alert, Oriented X 3, No new F.N deficits, Normal affect [place].AT,PERRAL Supple Neck,No JVD, No cervical lymphadenopathy appriciated.  Symmetrical Chest wall movement, Good air movement bilaterally, few coarse rales iRRR,No Gallops,Rubs or new Murmurs, No Parasternal Heave +ve B.Sounds, Abd Soft, No tenderness, No organomegaly appriciated, No rebound - guarding or rigidity. No Cyanosis, Clubbing or edema, No new Rash or bruise       Data Review:    CBC  Recent Labs Lab 07/09/16 0517 07/09/16 0540  07/10/16 0455  WBC 11.6*  --  17.3*  HGB 14.4 15.0 12.9*  HCT 44.2 44.0 40.2  PLT 224  --  218  MCV 94.4  --  93.5  MCH 30.8  --  30.0  MCHC 32.6  --  32.1  RDW 14.1  --  14.0  LYMPHSABS 1.2  --   --   MONOABS 1.5*  --   --   EOSABS 0.3  --   --   BASOSABS 0.1  --   --     Chemistries   Recent Labs Lab 07/09/16 0517 07/09/16 0540 07/10/16 0455  NA 133* 137 136  K 3.6 3.6 4.1  CL 97* 97* 104  CO2 27  --  26  GLUCOSE 116* 117* 162*  BUN 14 13 15   CREATININE 0.95 1.00 0.83  CALCIUM 8.6*  --  8.4*    ------------------------------------------------------------------------------------------------------------------ No results for input(s): CHOL, HDL, LDLCALC, TRIG, CHOLHDL, LDLDIRECT in the last 72 hours.  No results found for: HGBA1C ------------------------------------------------------------------------------------------------------------------  Recent Labs  07/09/16 0519  TSH 1.446   ------------------------------------------------------------------------------------------------------------------ No results for input(s): VITAMINB12, FOLATE, FERRITIN, TIBC, IRON, RETICCTPCT in the last 72 hours.  Coagulation profile No results for input(s): INR, PROTIME in the last 168 hours.  No results for input(s): DDIMER in the last 72 hours.  Cardiac Enzymes No results for input(s): CKMB, TROPONINI, MYOGLOBIN in the last 168 hours.  Invalid input(s): CK ------------------------------------------------------------------------------------------------------------------    Component Value Date/Time   BNP 83.0 07/09/2016 0517    Micro Results Recent Results (from the past 240 hour(s))  Blood culture (routine x 2)     Status: None (Preliminary result)   Collection Time: 07/09/16  5:38 AM  Result Value Ref Range Status   Specimen Description BLOOD LEFT HAND  Final   Special Requests BOTTLES DRAWN AEROBIC AND ANAEROBIC 6CC EACH  Final   Culture NO GROWTH < 12 HOURS  Final   Report Status PENDING  Incomplete  Blood culture (routine x 2)     Status: None (Preliminary result)   Collection Time: 07/09/16  5:44 AM  Result Value Ref Range Status   Specimen Description BLOOD RIGHT ARM  Final   Special Requests BOTTLES DRAWN AEROBIC AND ANAEROBIC 6CC EACH  Final   Culture NO GROWTH < 12 HOURS  Final   Report Status PENDING  Incomplete  MRSA PCR Screening     Status: None   Collection Time: 07/09/16  8:36 AM  Result Value Ref Range Status   MRSA by PCR NEGATIVE NEGATIVE Final    Comment:         The GeneXpert MRSA Assay (FDA approved for NASAL specimens only), is one component of a comprehensive MRSA colonization surveillance program. It is not intended to diagnose MRSA infection nor to guide or monitor treatment for MRSA infections.     Radiology Reports Dg Chest Port 1 View  Result Date: 07/10/2016 CLINICAL DATA:  SOB x1 week. Known pna in left lung. Hx of A-fib, COPD, HTN. EXAM: PORTABLE CHEST 1 VIEW COMPARISON:  07/09/2016 FINDINGS: Lungs are hyperinflated. There is perihilar peribronchial thickening. No focal consolidations or pleural effusions. Stable cardiomegaly. IMPRESSION: 1. Stable cardiomegaly. 2. Bronchitic changes. Electronically Signed   By: Nolon Nations M.D.   On: 07/10/2016 07:11   Dg Chest Portable 1 View  Result Date: 07/09/2016 CLINICAL DATA:  Shortness of breath, productive cough, wheezing for 3-4 days. History of COPD, bladder and skin cancer. EXAM: PORTABLE CHEST 1 VIEW COMPARISON:  Chest radiograph July 24, 2015 FINDINGS: The cardiac silhouette is mildly enlarged and unchanged. Mediastinal silhouette is nonsuspicious. Moderate likely layering LEFT pleural effusion with underlying consolidation. RIGHT lung is clear. No pneumothorax. Soft tissue planes and included osseous structures are nonsuspicious. IMPRESSION: Moderate suspected layering LEFT pleural effusion with underlying consolidation. Recommend follow-up PA and lateral views of the chest when clinically able. Mild cardiomegaly. Electronically Signed   By: Elon Alas M.D.   On: 07/09/2016 06:28    Time Spent in minutes  30   SINGH,PRASHANT K M.D on 07/10/2016 at 10:02 AM  Between 7am to 7pm - Pager - [phone]  After 7pm go to [email or web address] - password [facility]  [facility] -  Office  [phone]

## 2016-07-11 LAB — URINE CULTURE: Culture: NO GROWTH

## 2016-07-11 MED ORDER — LEVALBUTEROL HCL 0.63 MG/3ML IN NEBU: 0.6300 mg | INHALATION_SOLUTION | RESPIRATORY_TRACT | 0 refills | Status: DC | PRN

## 2016-07-11 MED ORDER — TIOTROPIUM BROMIDE MONOHYDRATE 18 MCG IN CAPS: 18.0000 ug | ORAL_CAPSULE | Freq: Every day | RESPIRATORY_TRACT | 0 refills | Status: DC

## 2016-07-11 MED ORDER — LEVOFLOXACIN 750 MG PO TABS: 750.0000 mg | ORAL_TABLET | Freq: Every day | ORAL | 0 refills | Status: DC

## 2016-07-11 MED ORDER — FUROSEMIDE 10 MG/ML IJ SOLN
40.0000 mg | Freq: Once | INTRAMUSCULAR | Status: AC
  Administered 2016-07-11: 40 mg via INTRAVENOUS
  Filled 2016-07-11: qty 4

## 2016-07-11 MED ORDER — POTASSIUM CHLORIDE CRYS ER 20 MEQ PO TBCR
20.0000 meq | EXTENDED_RELEASE_TABLET | Freq: Every day | ORAL | Status: DC
  Administered 2016-07-11: 20 meq via ORAL
  Filled 2016-07-11: qty 1

## 2016-07-11 MED ORDER — LEVALBUTEROL HCL 0.63 MG/3ML IN NEBU
0.6300 mg | INHALATION_SOLUTION | Freq: Three times a day (TID) | RESPIRATORY_TRACT | Status: DC
  Administered 2016-07-11: 0.63 mg via RESPIRATORY_TRACT
  Filled 2016-07-11: qty 3

## 2016-07-11 MED ORDER — METOPROLOL SUCCINATE ER 50 MG PO TB24: 50.0000 mg | ORAL_TABLET | Freq: Two times a day (BID) | ORAL | 0 refills | Status: DC

## 2016-07-11 NOTE — Care Management Note (Signed)
Case Management Note  Patient Details  Name: Matthew Hall MRN: [id number] Date of Birth: 1946/03/23  Subjective/Objective:     Patient adm from home, lives with wife, ind with ADL's. He has a PCP, still drives. He is thinking of switching to [facility]. He is refusing Eliquis and wants to use Coumadin instead. He needs a nebulizer machine.          Action/Plan: Romualdo Bolk of [facility] notified of order for nebulizer and will deliver to room prior to discharge.   Expected Discharge Date:     07/11/2016             Expected Discharge Plan:  Home/Self Care  In-House Referral:  NA  Discharge planning Services  CM Consult  Post Acute Care Choice:  NA Choice offered to:  NA  DME Arranged:    DME Agency:     HH Arranged:    HH Agency:     Status of Service:  Completed, signed off  If discussed at [employer] of Stay Meetings, dates discussed:    Additional Comments:  Matthew Hall, Matthew Reading, RN 07/11/2016, 11:17 AM

## 2016-07-11 NOTE — Discharge Summary (Signed)
J[facility] SUIT [id number] DOB: October 07, 1945 DOA: 07/09/2016  PCP: [name], MD  Admit date: 07/09/2016  Discharge date: 07/11/2016  Admitted From: Home   Disposition:  Home   Recommendations for Outpatient Follow-up:   Follow up with PCP in 1-2 weeks  PCP Please obtain BMP/CBC, 2 view CXR in 1week,  (see Discharge instructions)   PCP Please follow up on the following pending results: None   Home Health: None   Equipment/Devices: None  Consultations: None Discharge Condition: Stable   CODE STATUS: Full   Diet Recommendation: Heart Healthy    Chief Complaint  Patient presents with  . Cough     Brief history of present illness from the day of admission and additional interim summary    HaroldCampbellis a 71 y.o.male,With history of chronic atrial fibrillation [place] vasc 2 score of 2, COPD, remote history of smoking, bladder cancer, hypertension who lives at home comes to the hospital with 2-3 day history of productive cough, wheezing and shortness of breath. Subjective low-grade fevers. No recent travel or long drive, did get exposed to his wife who is been carrying a upper respiratory infection for the last few days. Came to the ER where he was diagnosed with pneumonia along with A. fib with RVR and I was called to admit.   Hospital issues addressed    1. CAP. He is much better with empiric Levaquin which will be continued for another 4 days orally, he is off of oxygen since yesterday, so far negative bloodand sputum cultures, he has minimal rales in lung sounds much better, he has had no wheezing I doubt that he had any COPD exacerbation, it stopped steroids and he is tolerated well.  His chest x-rays improved and currently has no active infiltrates, he is off oxygen, afebrile, will give him  4 days of Levaquin, nebulizer prescription, requested him to follow with PCP within a week for repeat CBC, BMP and a 2 view chest x-ray.  2.Chronic A. fib with RVR, [place] vasc 2 score of 2 - on Lopressor which is higher than home dose, currently in rate control, TSH and echo stable. I prescribed him liquids which she refused to take, he says in the [place] past his cardiologist asked him to start Coumadin which she had refused, I have counseled him and his wife on his risk of stroke, requested him to discuss with both PCP and cardiologist within a week on long-term anticoagulation.  3.Essential hypertension. Continue home regimen with increased beta blocker dose.  4.GERD. On PPI.  5. Dyslipidemia. On statin continue.   Discharge diagnosis     Principal Problem:   Community acquired pneumonia of left lower lobe of lung ([place]) Active Problems:   Essential hypertension   BRONCHIECTASIS W/O ACUTE EXACERBATION   Atrial fibrillation, chronic (HCC)   CAP (community acquired pneumonia)    Discharge instructions    Discharge Instructions    Discharge instructions    Complete by:  As directed    Follow with Primary MD [name], MD  and your Cardiologist in 7 days discuss anticoagulation for Afib.  Get CBC, CMP, 2 view Chest X ray checked  by Primary MD or SNF MD in 5-7 days ( we routinely change or add medications that can affect your baseline labs and fluid status, therefore we recommend that you get the mentioned basic workup next visit with your PCP, your PCP may decide not to get them or add new tests based on their clinical decision)   Activity: As tolerated with Full fall precautions use walker/cane & assistance as needed   Disposition Home     Diet:  Heart Healthy   For Heart failure patients - Check your Weight same time everyday, if you gain over 2 pounds, or you develop in leg swelling, experience more shortness of breath or chest pain, call your Primary MD  immediately. Follow Cardiac Low Salt Diet and 1.5 lit/day fluid restriction.   On your next visit with your primary care physician please Get Medicines reviewed and adjusted.   Please request your Prim.MD to go over all Hospital Tests and Procedure/Radiological results at the follow up, please get all Hospital records sent to your Prim MD by signing hospital release before you go home.   If you experience worsening of your admission symptoms, develop shortness of breath, life threatening emergency, suicidal or homicidal thoughts you must seek medical attention immediately by calling 911 or calling your MD immediately  if symptoms less severe.  You Must read complete instructions/literature along with all the possible adverse reactions/side effects for all the Medicines you take and that have been prescribed to you. Take any new Medicines after you have completely understood and accpet all the possible adverse reactions/side effects.   Do not drive, operate heavy machinery, perform activities at heights, swimming or participation in water activities or provide baby sitting services if your were admitted for syncope or siezures until you have seen by Primary MD or a Neurologist and advised to do so again.  Do not drive when taking Pain medications.    Do not take more than prescribed Pain, Sleep and Anxiety Medications  Special Instructions: If you have smoked or chewed Tobacco  in the last 2 yrs please stop smoking, stop any regular Alcohol  and or any Recreational drug use.  Wear Seat belts while driving.   Please note  You were cared for by a hospitalist during your hospital stay. If you have any questions about your discharge medications or the care you received while you were in the hospital after you are discharged, you can call the unit and asked to speak with the hospitalist on call if the hospitalist that took care of you is not available. Once you are discharged, your primary care  physician will handle any further medical issues. Please note that NO REFILLS for any discharge medications will be authorized once you are discharged, as it is imperative that you return to your primary care physician (or establish a relationship with a primary care physician if you do not have one) for your aftercare needs so that they can reassess your need for medications and monitor your lab values.   Increase activity slowly    Complete by:  As directed       Discharge Medications   Allergies as of 07/11/2016      Reactions   Augmentin [amoxicillin-pot Clavulanate] Rash   Ciprofloxacin Rash   Corticosteroids Rash   Latex Hives, Rash      Medication List  TAKE these medications   albuterol 108 (90 Base) MCG/ACT inhaler Commonly known as:  PROVENTIL HFA;VENTOLIN HFA Inhale 2 puffs into the lungs every 6 (six) hours as needed for wheezing or shortness of breath.   ALPRAZolam 0.25 MG tablet Commonly known as:  XANAX Take 0.25 mg by mouth 2 (two) times daily as needed.   aspirin 325 MG tablet Take 325 mg by mouth daily.   budesonide-formoterol 160-4.5 MCG/ACT inhaler Commonly known as:  SYMBICORT Inhale 2 puffs into the lungs 2 (two) times daily.   cetirizine 5 MG tablet Commonly known as:  ZYRTEC Take 5 mg by mouth daily.   esomeprazole 40 MG capsule Commonly known as:  NEXIUM Take 40 mg by mouth daily at 12 noon.   fluticasone 50 MCG/ACT nasal spray Commonly known as:  FLONASE Place 2 sprays into both nostrils daily.   hydroxypropyl methylcellulose / hypromellose 2.5 % ophthalmic solution Commonly known as:  ISOPTO TEARS / GONIOVISC Place 1 drop into both eyes as needed for dry eyes.   levalbuterol 0.63 MG/3ML nebulizer solution Commonly known as:  XOPENEX Take 3 mLs (0.63 mg total) by nebulization every 4 (four) hours as needed for wheezing or shortness of breath.   levofloxacin 750 MG tablet Commonly known as:  LEVAQUIN Take 1 tablet (750 mg total) by  mouth daily.   losartan-hydrochlorothiazide 50-12.5 MG tablet Commonly known as:  HYZAAR Take 1 tablet by mouth daily.   metoprolol succinate 50 MG 24 hr tablet Commonly known as:  TOPROL-XL Take 1 tablet (50 mg total) by mouth 2 (two) times daily. What changed:  medication strength  how much to take   simvastatin 80 MG tablet Commonly known as:  ZOCOR Take 40 mg by mouth at bedtime.   tiotropium 18 MCG inhalation capsule Commonly known as:  SPIRIVA Place 1 capsule (18 mcg total) into inhaler and inhale daily. Start taking on:  07/12/2016   triamcinolone 55 MCG/ACT Aero nasal inhaler Commonly known as:  NASACORT Place 1 spray into the nose daily as needed (Congestion).            Durable Medical Equipment        Start     Ordered   07/11/16 0802  For home use only DME Nebulizer/meds  Once    Question:  Patient needs a nebulizer to treat with the following condition  Answer:  COPD (chronic obstructive pulmonary disease) ([place])   07/11/16 0801      Follow-up Information    [name], MD. Schedule an appointment as soon as possible for a visit in 4 day(s).   Specialty:  Family Medicine Contact information: [place] [address] [phone]           Major procedures and Radiology Reports - PLEASE review detailed and final reports thoroughly  -     TTE - Left ventricle: The cavity size was normal. Wall thickness wasincreased in a pattern of mild LVH. Systolic function wasvigorous. The estimated ejection fraction was in the range of 65%to 70%. Wall motion was normal; there were no regional wallmotion abnormalities. The study is not technically sufficient to allow evaluation of LV diastolic function. - Mitral valve: Calcified annulus. - Left atrium: The atrium was moderately dilated. - Right atrium: The atrium was moderately dilated.    Dg Chest Port 1 View  Result Date: 07/10/2016 CLINICAL DATA:  SOB x1 week. Known pna in left lung.  Hx of A-fib, COPD, HTN. EXAM: PORTABLE CHEST 1 VIEW COMPARISON:  07/09/2016 FINDINGS: Lungs are hyperinflated. There is perihilar peribronchial thickening. No focal consolidations or pleural effusions. Stable cardiomegaly. IMPRESSION: 1. Stable cardiomegaly. 2. Bronchitic changes. Electronically Signed   By: Nolon Nations M.D.   On: 07/10/2016 07:11   Dg Chest Portable 1 View  Result Date: 07/09/2016 CLINICAL DATA:  Shortness of breath, productive cough, wheezing for 3-4 days. History of COPD, bladder and skin cancer. EXAM: PORTABLE CHEST 1 VIEW COMPARISON:  Chest radiograph July 24, 2015 FINDINGS: The cardiac silhouette is mildly enlarged and unchanged. Mediastinal silhouette is nonsuspicious. Moderate likely layering LEFT pleural effusion with underlying consolidation. RIGHT lung is clear. No pneumothorax. Soft tissue planes and included osseous structures are nonsuspicious. IMPRESSION: Moderate suspected layering LEFT pleural effusion with underlying consolidation. Recommend follow-up PA and lateral views of the chest when clinically able. Mild cardiomegaly. Electronically Signed   By: Elon Alas M.D.   On: 07/09/2016 06:28    Micro Results     Recent Results (from the past 240 hour(s))  Blood culture (routine x 2)     Status: None (Preliminary result)   Collection Time: 07/09/16  5:38 AM  Result Value Ref Range Status   Specimen Description BLOOD LEFT HAND  Final   Special Requests BOTTLES DRAWN AEROBIC AND ANAEROBIC [place]  Final   Culture NO GROWTH 2 DAYS  Final   Report Status PENDING  Incomplete  Blood culture (routine x 2)     Status: None (Preliminary result)   Collection Time: 07/09/16  5:44 AM  Result Value Ref Range Status   Specimen Description BLOOD RIGHT ARM  Final   Special Requests BOTTLES DRAWN AEROBIC AND ANAEROBIC [place]  Final   Culture NO GROWTH 2 DAYS  Final   Report Status PENDING  Incomplete  Urine culture     Status: None   Collection Time:  07/09/16  6:43 AM  Result Value Ref Range Status   Specimen Description URINE, CLEAN CATCH  Final   Special Requests NONE  Final   Culture NO GROWTH Performed at [facility]   Final   Report Status 07/11/2016 FINAL  Final  MRSA PCR Screening     Status: None   Collection Time: 07/09/16  8:36 AM  Result Value Ref Range Status   MRSA by PCR NEGATIVE NEGATIVE Final    Comment:        The GeneXpert MRSA Assay (FDA approved for NASAL specimens only), is one component of a comprehensive MRSA colonization surveillance program. It is not intended to diagnose MRSA infection nor to guide or monitor treatment for MRSA infections.     Today   Subjective    Masud Wilkins today has no headache,no chest abdominal pain,no new weakness tingling or numbness, feels much better wants to go home today.     Objective   Blood pressure (!) 146/82, pulse 75, temperature 97.7 F (36.5 C), temperature source Oral, resp. rate 18, height 5' 11.5" (1.816 m), weight 81.6 kg (179 lb 12.8 oz), SpO2 99 %.   Intake/Output Summary (Last 24 hours) at 07/11/16 0927 Last data filed at 07/10/16 1700  Gross per 24 hour  Intake              870 ml  Output                0 ml  Net              870 ml    Exam Awake Alert, Oriented x 3,  No new F.N deficits, Normal affect [place].AT,PERRAL Supple Neck,No JVD, No cervical lymphadenopathy appriciated.  Symmetrical Chest wall movement, Good air movement bilaterally, few rales RRR,No Gallops,Rubs or new Murmurs, No Parasternal Heave +ve B.Sounds, Abd Soft, Non tender, No organomegaly appriciated, No rebound -guarding or rigidity. No Cyanosis, Clubbing or edema, No new Rash or bruise   Data Review   CBC w Diff: Lab Results  Component Value Date   WBC 17.3 (H) 07/10/2016   HGB 12.9 (L) 07/10/2016   HCT 40.2 07/10/2016   PLT 218 07/10/2016   LYMPHOPCT 11 07/09/2016   MONOPCT 13 07/09/2016   EOSPCT 2 07/09/2016   BASOPCT 0 07/09/2016    CMP: Lab  Results  Component Value Date   NA 136 07/10/2016   K 4.1 07/10/2016   CL 104 07/10/2016   CO2 26 07/10/2016   BUN 15 07/10/2016   CREATININE 0.83 07/10/2016   PROT 7.8 07/30/2014   ALBUMIN 3.6 07/30/2014   BILITOT 0.9 07/30/2014   ALKPHOS 61 07/30/2014   AST 26 07/30/2014   ALT 23 07/30/2014  .   Total Time in preparing paper work, data evaluation and todays exam - 35 minutes  Thurnell Lose M.D on 07/11/2016 at 9:27 AM  [facility]   Office  [phone]

## 2016-07-14 LAB — CULTURE, BLOOD (ROUTINE X 2)
Culture: NO GROWTH
Culture: NO GROWTH

## 2016-08-07 ENCOUNTER — Ambulatory Visit: Payer: Medicare Other | Admitting: Pulmonary Disease

## 2016-08-08 ENCOUNTER — Ambulatory Visit (INDEPENDENT_AMBULATORY_CARE_PROVIDER_SITE_OTHER): Payer: Medicare Other | Admitting: Pulmonary Disease

## 2016-08-08 ENCOUNTER — Encounter: Payer: Self-pay | Admitting: Pulmonary Disease

## 2016-08-08 ENCOUNTER — Ambulatory Visit (INDEPENDENT_AMBULATORY_CARE_PROVIDER_SITE_OTHER)
Admission: RE | Admit: 2016-08-08 | Discharge: 2016-08-08 | Disposition: A | Discharge status: Home / Self Care | Payer: Medicare Other | Source: Ambulatory Visit | Attending: Pulmonary Disease | Admitting: Pulmonary Disease

## 2016-08-08 DIAGNOSIS — J438 Other emphysema: Secondary | ICD-10-CM

## 2016-08-08 DIAGNOSIS — J181 Lobar pneumonia, unspecified organism: Secondary | ICD-10-CM | POA: Diagnosis not present

## 2016-08-08 DIAGNOSIS — J189 Pneumonia, unspecified organism: Secondary | ICD-10-CM

## 2016-08-08 NOTE — Assessment & Plan Note (Signed)
Stay on Symbicort twice daily and Spiriva once daily Use albuterol as needed for rescue only Xopenex nebs when necessary only for emergency

## 2016-08-08 NOTE — Patient Instructions (Signed)
Chest x-ray today Stay on Symbicort twice daily and Spiriva once daily Use albuterol as needed for rescue only

## 2016-08-08 NOTE — Progress Notes (Signed)
   Subjective:    Patient ID: Matthew Hall, male    DOB: January 09, 1946, 71 y.o.   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   08/08/2016  Chief Complaint  Patient presents with  . Follow-up    6 month follow up. Was at [facility] on 08/10/15 for pneumonia. Feels a lot better now.    He had a flare on New Year's Eve and had to go to [facility] and was admitted, chest x-ray showed left lower lobe pneumonia He was given albuterol nebs which raised his heart rate to 160s and he was admitted to the ICU. He was discharged on Symbicort and Spiriva and given Xopenex nebs which she was able to fill He was since been started on Coumadin for atrial fibrillation He feels like he has recovered and is slowly getting back to baseline, he denies cough or sputum production or wheezing. He is able to sleep without orthopnea or paroxysmal nocturnal dyspnea  Significant tests/ events  Episode of bronchitis 10/2007 with abnormal CXR, CT showed >> RLL & lingular bronchectasis & areas of pleuro-parenchymal scarring. 8 mm low density lesion in liver >> stable on rpt CT7/2009. Incidentally, not seen on CT in 1995.  FEV1 trend '95 59% >> 64% in 09/2007 >> 59% in 03/2008   09/2014  FEv1 40%, ratio 62 07/2015 FEV1 40%   Review of Systems neg for any significant sore throat, dysphagia, itching, sneezing, nasal congestion or excess/ purulent secretions, fever, chills, sweats, unintended wt loss, pleuritic or exertional cp, hempoptysis, orthopnea pnd or change in chronic leg swelling. Also denies presyncope, palpitations, heartburn, abdominal pain, nausea, vomiting, diarrhea or change in bowel or urinary habits, dysuria,hematuria, rash, arthralgias, visual complaints, headache,  numbness weakness or ataxia.     Objective:   Physical Exam  Gen. Pleasant, well-nourished, in no distress ENT - no lesions, no post nasal drip Neck: No JVD, no thyromegaly, no carotid bruits Lungs: no use of accessory muscles, no dullness to percussion, clear left base, rt basal rales no rhonchi  Cardiovascular: Rhythm regular, heart sounds  normal, no murmurs or gallops, no peripheral edema Musculoskeletal: No deformities, no cyanosis or clubbing        Assessment & Plan:

## 2016-08-08 NOTE — Assessment & Plan Note (Signed)
Chest x-ray today to check for resolution

## 2016-08-09 ENCOUNTER — Telehealth: Payer: Self-pay | Admitting: Pulmonary Disease

## 2016-08-09 NOTE — Progress Notes (Signed)
See PN dated 08/09/16

## 2016-08-09 NOTE — Telephone Encounter (Signed)
Notes Recorded by Rigoberto Noel, MD on 08/08/2016 at 11:51 AM EST Pneumonia has resolved on chest x-ray  Spoke with pt and notified of results per Dr. Elsworth Soho. Pt verbalized understanding and denied any questions.

## 2016-10-19 ENCOUNTER — Telehealth: Payer: Self-pay | Admitting: Pulmonary Disease

## 2016-10-19 MED ORDER — LEVALBUTEROL HCL 0.63 MG/3ML IN NEBU: 0.6300 mg | INHALATION_SOLUTION | Freq: Four times a day (QID) | RESPIRATORY_TRACT | 6 refills | Status: DC | PRN

## 2016-10-19 NOTE — Telephone Encounter (Signed)
Called and spoke with pt and he stated that his insurance will not cover the xopenex neb meds.  He stated that he has been paying out of pocket for this med, but it is not cheap and he is not able to use the albuterol due to it makes his heart race and makes him shake too much.  Pt stated that there is a place in [place] that he can take the rx for the xopenex to and can get it at a very discounted price.    rx has been printed out and placed in RA cubby to be signed and then will need to be mailed to the pt.  Will forward to [place] to follow up on.  thanks

## 2016-10-19 NOTE — Telephone Encounter (Signed)
Matthew Hall have RA sign RX tomorrow when he returns to the [name] office.

## 2016-10-20 NOTE — Telephone Encounter (Signed)
RX has been signed and will be mailed to patient today. Pt is aware.

## 2016-11-09 ENCOUNTER — Encounter (INDEPENDENT_AMBULATORY_CARE_PROVIDER_SITE_OTHER): Payer: Self-pay | Admitting: *Deleted

## 2016-12-06 ENCOUNTER — Telehealth: Payer: Self-pay | Admitting: Internal Medicine

## 2016-12-06 NOTE — Telephone Encounter (Signed)
Received patient previous colon records from [place], [place]. Patient states he thinks he is a year overdue. Placed on DOD for 5.23.18 Dr.Perry's desk for review.

## 2016-12-14 ENCOUNTER — Encounter: Payer: Self-pay | Admitting: Internal Medicine

## 2016-12-14 NOTE — Telephone Encounter (Signed)
Dr.Perry reviewed records and accepted pt for a direct colon in [place].

## 2017-01-22 ENCOUNTER — Encounter: Payer: Self-pay | Admitting: Internal Medicine

## 2017-01-22 ENCOUNTER — Ambulatory Visit (INDEPENDENT_AMBULATORY_CARE_PROVIDER_SITE_OTHER): Payer: Medicare Other | Admitting: Internal Medicine

## 2017-01-22 VITALS — BP 128/78 | HR 64 | Ht 69.5 in | Wt 182.2 lb

## 2017-01-22 DIAGNOSIS — Z86010 Personal history of colonic polyps: Secondary | ICD-10-CM | POA: Diagnosis not present

## 2017-01-22 DIAGNOSIS — Z7901 Long term (current) use of anticoagulants: Secondary | ICD-10-CM | POA: Diagnosis not present

## 2017-01-22 DIAGNOSIS — K219 Gastro-esophageal reflux disease without esophagitis: Secondary | ICD-10-CM

## 2017-01-22 NOTE — Patient Instructions (Signed)
It has been recommended to you by your physician that you have a(n) colonoscopy completed. We did not schedule the procedure(s) today.Our office will contact you when his schedule is available.  You will need to hold your Coumadin 5 days prior to the procedure and start Aspirin 325 mg daily instead.

## 2017-01-22 NOTE — Progress Notes (Signed)
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 Patient presents today to establish as a new GI patient after his gastroenterologist in [place] has retired. Some old records had been previously forwarded. He underwent colonoscopy in 2001 and was found to have tubular adenoma. Follow-up colonoscopy in 2004 revealed diverticulosis but no polyps. Subsequent follow-up in October 2010 was negative except for diverticulosis. Follow-up in 5 years recommended. The patient was hospitalized earlier this issue with bronchitis and flare of COPD. Found to be in atrial fibrillation for which she was placed on Coumadin therapy. Had been on Coumadin about 15 years ago for about 3 years for the same reason. No prior history of TIA or stroke. He has come off anticoagulation for procedural work. He is anticipating skin cancer surgery at [facility] in the near future. His lower GI review of systems is negative. In particular no change in bowel habits, abdominal pain, rectal bleeding, or unexplained weight loss. She does have chronic GERD for which he takes omeprazole 40 mg daily. He will experience occasional right her symptoms when he misses a dosage of his medication. He denies dysphagia. No other complaints  REVIEW OF SYSTEMS:  All non-GI ROS negative except for sinus and allergy trouble, fatigue, irregular heartbeat, remote history of nosebleeds  Past Medical History:  Diagnosis Date  . Anxiety   . Atrial fibrillation ([place])   . Bladder cancer ([place])   . Bladder tumor   . Colon polyps   . COPD (chronic obstructive pulmonary disease) ([place])   . Emphysema/COPD ([place])   . Hypertension   . Kidney stone   . Pneumonia   . Skin cancer     Past Surgical History:  Procedure Laterality Date  . bladder tumor removed    . Kidney stones removed      Social  History Matthew Hall  reports that he quit smoking about 5 years ago. His smoking use included Cigarettes. He has a 30.00 pack-year smoking history. He has never used smokeless tobacco. He reports that he does not drink alcohol or use drugs.  family history includes Diabetes in his mother and sister; Emphysema in his father; Liver cancer in his brother; Lung cancer in his brother.  Allergies  Allergen Reactions  . Augmentin [Amoxicillin-Pot Clavulanate] Rash  . Ciprofloxacin Rash  . Corticosteroids Rash  . Latex Hives and Rash       PHYSICAL EXAMINATION: Vital signs: BP 128/78 (BP Location: Left Arm, Patient Position: Sitting, Cuff Size: Normal)   Pulse 64   Ht 5' 9.5" (1.765 m) Comment: height measured without shoes  Wt 182 lb 4 oz (82.7 kg)   BMI 26.53 kg/m   Constitutional: generally well-appearing, no acute distress Psychiatric: alert and oriented x3, cooperative Eyes: extraocular movements intact, anicteric, conjunctiva pink Mouth: oral pharynx moist, no lesions Neck: supple no lymphadenopathy Cardiovascular: heart Irregular but controlled rate and rhythm, no murmur Lungs: clear to auscultation bilaterally Abdomen: soft, nontender, nondistended, no obvious ascites, no peritoneal signs, normal bowel sounds, no organomegaly Rectal: Deferred until colonoscopy Extremities: no clubbing cyanosis or lower extremity edema bilaterally Skin: no lesions on visible extremities except for ecchymoses. Skin cancer behind the right ear Neuro: No focal deficits. Cranial nerves intact  ASSESSMENT:  #1. GERD without alarm features. Symptoms controlled when compliant with PPI #2. History of adenomatous colon polyp 2001. Negative exams  2014 2010. Overdue for surveillance based on previous recommendation #3. Multiple medical problems including atrial fibrillation for which she is on chronic Coumadin. Stable   PLAN:  #1. Reflux precautions #2. Continue omeprazole 40 mg daily. Good for  GERD and upper GI mucosal protection in a patient on chronic Coumadin with multiple comorbidities #3. Schedule surveillance colonoscopy. The patient is high-risk given his comorbidities  (COPD, atrial fibrillation) and the need to address anticoagulation therapy.The nature of the procedure, as well as the risks, benefits, and alternatives were carefully and thoroughly reviewed with the patient. Ample time for discussion and questions allowed. The patient understood, was satisfied, and agreed to proceed. #4. Hold Coumadin 5 days prior to the procedure. Pros and cons of the strategy reviewed. Instructed to initiate daily baby aspirin one week prior to the procedure to provide a level of protection while off Coumadin. He agrees  A copy of this consultation note has been sent to his primary provider Dr. Macarthur Critchley

## 2017-01-24 ENCOUNTER — Other Ambulatory Visit: Payer: Self-pay | Admitting: Emergency Medicine

## 2017-01-24 ENCOUNTER — Telehealth: Payer: Self-pay | Admitting: Emergency Medicine

## 2017-01-24 DIAGNOSIS — Z86010 Personal history of colonic polyps: Secondary | ICD-10-CM

## 2017-01-24 MED ORDER — NA SULFATE-K SULFATE-MG SULF 17.5-3.13-1.6 GM/177ML PO SOLN: 1.0000 | Freq: Once | ORAL | 0 refills | Status: AC

## 2017-01-24 NOTE — Telephone Encounter (Signed)
Matthew Hall Dec 29, 1945 [id number]  Dear Dr. Macarthur Critchley:  We have scheduled the above named patient for a colonoscopy procedure. Our records show that he is on anticoagulation therapy.  Please advise as to whether the patient may come off their therapy of Coumadin 5 days prior to their procedure. We have instructed him to take 325 mg of Aspirin on the days he does not take Coumadin.   Please fax response to [phone], attention: Desiree.    Sincerely,  Tinnie Gens, [facility]

## 2017-02-02 ENCOUNTER — Encounter (HOSPITAL_COMMUNITY): Payer: Self-pay | Admitting: Emergency Medicine

## 2017-02-02 ENCOUNTER — Emergency Department (HOSPITAL_COMMUNITY)
Admission: EM | Admit: 2017-02-02 | Discharge: 2017-02-02 | Disposition: A | Discharge status: Home / Self Care | Payer: Medicare Other | Attending: Emergency Medicine | Admitting: Emergency Medicine

## 2017-02-02 DIAGNOSIS — R21 Rash and other nonspecific skin eruption: Secondary | ICD-10-CM | POA: Insufficient documentation

## 2017-02-02 DIAGNOSIS — Z7901 Long term (current) use of anticoagulants: Secondary | ICD-10-CM | POA: Diagnosis not present

## 2017-02-02 DIAGNOSIS — J449 Chronic obstructive pulmonary disease, unspecified: Secondary | ICD-10-CM | POA: Insufficient documentation

## 2017-02-02 DIAGNOSIS — Z79899 Other long term (current) drug therapy: Secondary | ICD-10-CM | POA: Insufficient documentation

## 2017-02-02 DIAGNOSIS — W57XXXA Bitten or stung by nonvenomous insect and other nonvenomous arthropods, initial encounter: Secondary | ICD-10-CM | POA: Insufficient documentation

## 2017-02-02 DIAGNOSIS — Y999 Unspecified external cause status: Secondary | ICD-10-CM | POA: Diagnosis not present

## 2017-02-02 DIAGNOSIS — Y939 Activity, unspecified: Secondary | ICD-10-CM | POA: Diagnosis not present

## 2017-02-02 DIAGNOSIS — Z87891 Personal history of nicotine dependence: Secondary | ICD-10-CM | POA: Diagnosis not present

## 2017-02-02 DIAGNOSIS — Y929 Unspecified place or not applicable: Secondary | ICD-10-CM | POA: Insufficient documentation

## 2017-02-02 DIAGNOSIS — I10 Essential (primary) hypertension: Secondary | ICD-10-CM | POA: Diagnosis not present

## 2017-02-02 DIAGNOSIS — T148XXA Other injury of unspecified body region, initial encounter: Secondary | ICD-10-CM | POA: Insufficient documentation

## 2017-02-02 LAB — CBC WITH DIFFERENTIAL/PLATELET
Basophils Absolute: 0 10*3/uL (ref 0.0–0.1)
Basophils Relative: 0 %
Eosinophils Absolute: 0.2 10*3/uL (ref 0.0–0.7)
Eosinophils Relative: 2 %
HCT: 43.2 % (ref 39.0–52.0)
Hemoglobin: 14 g/dL (ref 13.0–17.0)
Lymphocytes Relative: 13 %
Lymphs Abs: 1.5 10*3/uL (ref 0.7–4.0)
MCH: 30.2 pg (ref 26.0–34.0)
MCHC: 32.4 g/dL (ref 30.0–36.0)
MCV: 93.3 fL (ref 78.0–100.0)
Monocytes Absolute: 1.3 10*3/uL — ABNORMAL HIGH (ref 0.1–1.0)
Monocytes Relative: 11 %
Neutro Abs: 8.6 10*3/uL — ABNORMAL HIGH (ref 1.7–7.7)
Neutrophils Relative %: 74 %
Platelets: 264 10*3/uL (ref 150–400)
RBC: 4.63 MIL/uL (ref 4.22–5.81)
RDW: 14.3 % (ref 11.5–15.5)
WBC: 11.6 10*3/uL — ABNORMAL HIGH (ref 4.0–10.5)

## 2017-02-02 LAB — BASIC METABOLIC PANEL
Anion gap: 7 (ref 5–15)
BUN: 16 mg/dL (ref 6–20)
CO2: 31 mmol/L (ref 22–32)
Calcium: 9 mg/dL (ref 8.9–10.3)
Chloride: 99 mmol/L — ABNORMAL LOW (ref 101–111)
Creatinine, Ser: 1.07 mg/dL (ref 0.61–1.24)
GFR calc Af Amer: >60 mL/min (ref >60)
GFR calc non Af Amer: >60 mL/min (ref >60)
Glucose, Bld: 124 mg/dL — ABNORMAL HIGH (ref 65–99)
Potassium: 3.5 mmol/L (ref 3.5–5.1)
Sodium: 137 mmol/L (ref 135–145)

## 2017-02-02 LAB — PROTIME-INR
INR: 2.21
Prothrombin Time: 24.9 seconds — ABNORMAL HIGH (ref 11.4–15.2)

## 2017-02-02 MED ORDER — DOXYCYCLINE HYCLATE 100 MG PO CAPS: 100.0000 mg | ORAL_CAPSULE | Freq: Two times a day (BID) | ORAL | 0 refills | Status: DC

## 2017-02-02 NOTE — ED Triage Notes (Signed)
Pt states he may have lymes disease. C/o rash to ble x 2 days. And ulcers on eyes the other day and finished abx for that. Red bumps noted to ble. Nad. C/o pain to ble

## 2017-02-02 NOTE — Discharge Instructions (Signed)
The doxycycline may elevate your INR level so its important to keep your appt with your doctor for next week to have your INR rechecked.  Tylenol every 4-6 hrs if needed for pain.  Return here for any worsening symptoms

## 2017-02-04 ENCOUNTER — Emergency Department (HOSPITAL_COMMUNITY)
Admission: EM | Admit: 2017-02-04 | Discharge: 2017-02-05 | Disposition: A | Discharge status: Home / Self Care | Payer: Medicare Other | Attending: Emergency Medicine | Admitting: Emergency Medicine

## 2017-02-04 ENCOUNTER — Encounter (HOSPITAL_COMMUNITY): Payer: Self-pay | Admitting: Emergency Medicine

## 2017-02-04 DIAGNOSIS — I10 Essential (primary) hypertension: Secondary | ICD-10-CM | POA: Diagnosis not present

## 2017-02-04 DIAGNOSIS — R233 Spontaneous ecchymoses: Secondary | ICD-10-CM | POA: Insufficient documentation

## 2017-02-04 DIAGNOSIS — R21 Rash and other nonspecific skin eruption: Secondary | ICD-10-CM | POA: Diagnosis present

## 2017-02-04 DIAGNOSIS — J449 Chronic obstructive pulmonary disease, unspecified: Secondary | ICD-10-CM | POA: Diagnosis not present

## 2017-02-04 DIAGNOSIS — Z7901 Long term (current) use of anticoagulants: Secondary | ICD-10-CM | POA: Diagnosis not present

## 2017-02-04 DIAGNOSIS — Z23 Encounter for immunization: Secondary | ICD-10-CM | POA: Diagnosis not present

## 2017-02-04 DIAGNOSIS — Z87891 Personal history of nicotine dependence: Secondary | ICD-10-CM | POA: Diagnosis not present

## 2017-02-04 DIAGNOSIS — L959 Vasculitis limited to the skin, unspecified: Secondary | ICD-10-CM | POA: Insufficient documentation

## 2017-02-04 DIAGNOSIS — I776 Arteritis, unspecified: Secondary | ICD-10-CM

## 2017-02-04 LAB — BASIC METABOLIC PANEL
Anion gap: 8 (ref 5–15)
BUN: 12 mg/dL (ref 6–20)
CO2: 26 mmol/L (ref 22–32)
Calcium: 9 mg/dL (ref 8.9–10.3)
Chloride: 98 mmol/L — ABNORMAL LOW (ref 101–111)
Creatinine, Ser: 0.96 mg/dL (ref 0.61–1.24)
GFR calc Af Amer: >60 mL/min (ref >60)
GFR calc non Af Amer: >60 mL/min (ref >60)
Glucose, Bld: 175 mg/dL — ABNORMAL HIGH (ref 65–99)
Potassium: 3.4 mmol/L — ABNORMAL LOW (ref 3.5–5.1)
Sodium: 132 mmol/L — ABNORMAL LOW (ref 135–145)

## 2017-02-04 LAB — CBC
HCT: 41.6 % (ref 39.0–52.0)
Hemoglobin: 14 g/dL (ref 13.0–17.0)
MCH: 30.2 pg (ref 26.0–34.0)
MCHC: 33.7 g/dL (ref 30.0–36.0)
MCV: 89.8 fL (ref 78.0–100.0)
Platelets: 311 10*3/uL (ref 150–400)
RBC: 4.63 MIL/uL (ref 4.22–5.81)
RDW: 14.1 % (ref 11.5–15.5)
WBC: 12.2 10*3/uL — ABNORMAL HIGH (ref 4.0–10.5)

## 2017-02-04 MED ORDER — TETANUS-DIPHTH-ACELL PERTUSSIS 5-2.5-18.5 LF-MCG/0.5 IM SUSP
0.5000 mL | Freq: Once | INTRAMUSCULAR | Status: AC
  Administered 2017-02-04: 0.5 mL via INTRAMUSCULAR
  Filled 2017-02-04: qty 0.5

## 2017-02-04 NOTE — ED Provider Notes (Signed)
[facility] DEPT Provider Note   CSN: [id number] Arrival date & time: 02/04/17  2104    History   Chief Complaint Chief Complaint  Patient presents with  . [place] Spotted fever, worsening rash to lower ext    HPI Matthew Hall is a 71 y.o. male.   71 year old male presents to the emergency department for evaluation of persistent rash. He presented to [facility] 2 days ago for similar symptoms. Patient was started on doxycycline for limes and RMSF coverage following prior ED visit. Rash has been present for 4-5 days. He reports subjective worsening of his rash. He also complains of generalized fatigue and chills. He has had some swelling in his lower extremities as well as general extremity soreness. The patient was advised to follow-up with his primary care doctor tomorrow for a recheck of his INR given that he is on chronic Coumadin and taking doxycycline. He denies any known fevers. No nausea or vomiting. Prior RMSF titer is pending      Past Medical History:  Diagnosis Date  . Anxiety   . Atrial fibrillation ([place])   . Bladder cancer ([place])   . Bladder tumor   . Colon polyps   . COPD (chronic obstructive pulmonary disease) ([place])   . Emphysema/COPD ([place])   . Hypertension   . Kidney stone   . Pneumonia   . Skin cancer     Patient Active Problem List   Diagnosis Date Noted  . Community acquired pneumonia of left lower lobe of lung ([place]) 07/09/2016  . Atrial fibrillation, chronic ([place]) 07/09/2016  . CAP (community acquired pneumonia) 07/09/2016  . BRONCHIECTASIS W/O ACUTE EXACERBATION 03/31/2008  . Essential hypertension 09/13/2007  . Allergic rhinitis 09/13/2007  . EMPHYSEMA 09/13/2007  . COPD exacerbation ([place]) 09/13/2007    Past Surgical History:  Procedure Laterality Date  . bladder tumor removed    . Kidney stones removed         Home Medications    Prior to Admission medications   Medication Sig Start Date End Date Taking?  Authorizing Provider  albuterol (PROVENTIL HFA;VENTOLIN HFA) 108 (90 BASE) MCG/ACT inhaler Inhale 2 puffs into the lungs every 6 (six) hours as needed for wheezing or shortness of breath. 08/03/14   Rigoberto Noel, MD  ALPRAZolam Duanne Moron) 0.25 MG tablet Take 0.25 mg by mouth 2 (two) times daily as needed.  07/01/14   [provider]  budesonide-formoterol (SYMBICORT) 160-4.5 MCG/ACT inhaler Inhale 2 puffs into the lungs 2 (two) times daily. 12/01/14   Rigoberto Noel, MD  cetirizine (ZYRTEC) 5 MG tablet Take 5 mg by mouth daily.    [provider]  doxycycline (VIBRAMYCIN) 100 MG capsule Take 1 capsule (100 mg total) by mouth 2 (two) times daily. 02/02/17   Triplett, Tammy, PA-C  fluticasone (FLONASE) 50 MCG/ACT nasal spray Place 2 sprays into both nostrils daily.    [provider]  hydroxypropyl methylcellulose / hypromellose (ISOPTO TEARS / GONIOVISC) 2.5 % ophthalmic solution Place 1 drop into both eyes as needed for dry eyes.    [provider]  levalbuterol Penne Lash) 0.63 MG/3ML nebulizer solution Take 3 mLs (0.63 mg total) by nebulization every 6 (six) hours as needed for wheezing or shortness of breath. 10/19/16   Rigoberto Noel, MD  losartan-hydrochlorothiazide (HYZAAR) 50-12.5 MG tablet Take 1 tablet by mouth daily.    [provider]  metoprolol succinate (TOPROL-XL) 50 MG 24 hr tablet Take 1 tablet (50 mg total) by mouth  2 (two) times daily. 07/11/16   Thurnell Lose, MD  omeprazole (PRILOSEC) 40 MG capsule Take 40 mg by mouth daily.    [provider]  simvastatin (ZOCOR) 80 MG tablet Take 40 mg by mouth at bedtime.  07/18/14   [provider]  tiotropium (SPIRIVA) 18 MCG inhalation capsule Place 1 capsule (18 mcg total) into inhaler and inhale daily. Patient not taking: Reported on 02/02/2017 07/12/16   Thurnell Lose, MD  triamcinolone (NASACORT) 55 MCG/ACT AERO nasal inhaler Place 1 spray into the nose daily as needed  (Congestion).     [provider]  warfarin (COUMADIN) 2.5 MG tablet Take 2.5 mg by mouth daily.    [provider]    Family History Family History  Problem Relation Age of Onset  . Emphysema Father   . Lung cancer Brother   . Liver cancer Brother   . Diabetes Mother   . Diabetes Sister     Social History Social History  Substance Use Topics  . Smoking status: Former Smoker    Packs/day: 1.00    Years: 30.00    Types: Cigarettes    Quit date: 07/07/2011  . Smokeless tobacco: Never Used  . Alcohol use No     Allergies   Albuterol; Augmentin [amoxicillin-pot clavulanate]; Ciprofloxacin; Corticosteroids; and Latex   Review of Systems Review of Systems Ten systems reviewed and are negative for acute change, except as noted in the HPI.    Physical Exam Updated Vital Signs BP (!) 149/97   Pulse 68   Temp 98.2 F (36.8 C) (Oral)   Resp 18   Ht 5\' 11"  (1.803 m)   Wt 82.6 kg (182 lb)   SpO2 96%   BMI 25.38 kg/m   Physical Exam  Constitutional: He is oriented to person, place, and time. He appears well-developed and well-nourished. No distress.  Nontoxic and in NAD  HENT:  Head: Normocephalic and atraumatic.  Eyes: Conjunctivae and EOM are normal. No scleral icterus.  Neck: Normal range of motion.  Cardiovascular: Normal rate, regular rhythm and intact distal pulses.   Pulmonary/Chest: Effort normal. No respiratory distress.  Respirations even and unlabored  Musculoskeletal: Normal range of motion.  Neurological: He is alert and oriented to person, place, and time. He exhibits normal muscle tone. Coordination normal.  GCS 15. Patient moving all extremities.  Skin: Skin is warm and dry. Rash noted. He is not diaphoretic. No erythema. No pallor.  Petechial rash to BLE and soles extending proximally to the lower abdomen and low back. Rash also noted along the dorsal aspect of BUE from the wrists to the axilla. Mild associated swelling to BLE  without pitting edema.  Psychiatric: He has a normal mood and affect. His behavior is normal.  Nursing note and vitals reviewed.    ED Treatments / Results  Labs (all labs ordered are listed, but only abnormal results are displayed) Labs Reviewed  CBC - Abnormal; Notable for the following:       Result Value   WBC 12.2 (*)    All other components within normal limits  BASIC METABOLIC PANEL - Abnormal; Notable for the following:    Sodium 132 (*)    Potassium 3.4 (*)    Chloride 98 (*)    Glucose, Bld 175 (*)    All other components within normal limits  PROTIME-INR - Abnormal; Notable for the following:    Prothrombin Time 26.6 (*)    All other components within normal  limits    EKG  EKG Interpretation None       Radiology No results found.  Procedures Procedures (including critical care time)  Medications Ordered in ED Medications  Tdap (BOOSTRIX) injection 0.5 mL (0.5 mLs Intramuscular Given 02/04/17 2346)     Initial Impression / Assessment and Plan / ED Course  I have reviewed the triage vital signs and the nursing notes.  Pertinent labs & imaging results that were available during my care of the patient were reviewed by me and considered in my medical decision making (see chart for details).     71 year old male presents to the emergency department for persistent petechial rash to his lower extremities. Symptoms have been present for 4-5 days. He was seen 48 hours ago at [facility] where there was concern for recommended spotted fever. Patient reports associated fatigue as well as chills. He denies known fever. He further complains of an achy discomfort in his bilateral lower extremities. He has been compliant with his doxycycline over the past 2 days, taking a total of 5 doses of this medication.  Laboratory workup today is reassuring. No significant change from prior evaluation 2 days ago. Patient does not meet sepsis criteria. He has no fever, tachycardia,  hypotension, tachypnea. I have encouraged the patient to continue his antibiotic. Unfortunately, his RMSF titer has not yet resulted to confirm a diagnosis of [facility] spotted fever. Supportive measures discussed and provided. Primary care follow-up advised and return precautions given. Patient discharged in stable condition; patient and family with no unaddressed concerns.   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

## 2017-02-04 NOTE — ED Triage Notes (Signed)
Reports having a pink rash on lower ext that started over a week ago.  Progressively getting worse.  Red rash noted to BLE with some black areas noted.

## 2017-02-04 NOTE — ED Provider Notes (Signed)
[facility] DEPT Provider Note   CSN: [id number] Arrival date & time: 02/02/17  0815     History   Chief Complaint Chief Complaint  Patient presents with  . Rash    HPI Matthew Hall is a 71 y.o. male.  HPI  Matthew Hall is a 71 y.o. male who presents to the Emergency Department complaining of persistently worsening rash of both legs and arms for 2-3 days. Rash began on lower extremities.  He also complains of generalized fatigue and body aches.  He recalls a tick bite approximately 3 weeks ago.  He denies vomiting, fever, swelling, headaches.  Recently finished an antibiotic eye drop for a ulcer to his eye.  Pt also taking coumadin daily for atrial fibrillation.    Past Medical History:  Diagnosis Date  . Anxiety   . Atrial fibrillation ([place])   . Bladder cancer ([place])   . Bladder tumor   . Colon polyps   . COPD (chronic obstructive pulmonary disease) ([place])   . Emphysema/COPD ([place])   . Hypertension   . Kidney stone   . Pneumonia   . Skin cancer     Patient Active Problem List   Diagnosis Date Noted  . Community acquired pneumonia of left lower lobe of lung ([place]) 07/09/2016  . Atrial fibrillation, chronic ([place]) 07/09/2016  . CAP (community acquired pneumonia) 07/09/2016  . BRONCHIECTASIS W/O ACUTE EXACERBATION 03/31/2008  . Essential hypertension 09/13/2007  . Allergic rhinitis 09/13/2007  . EMPHYSEMA 09/13/2007  . COPD exacerbation ([place]) 09/13/2007    Past Surgical History:  Procedure Laterality Date  . bladder tumor removed    . Kidney stones removed         Home Medications    Prior to Admission medications   Medication Sig Start Date End Date Taking? Authorizing Provider  albuterol (PROVENTIL HFA;VENTOLIN HFA) 108 (90 BASE) MCG/ACT inhaler Inhale 2 puffs into the lungs every 6 (six) hours as needed for wheezing or shortness of breath. 08/03/14  Yes Rigoberto Noel, MD  ALPRAZolam Duanne Moron) 0.25 MG tablet Take 0.25 mg by mouth 2 (two) times  daily as needed.  07/01/14  Yes [provider]  budesonide-formoterol (SYMBICORT) 160-4.5 MCG/ACT inhaler Inhale 2 puffs into the lungs 2 (two) times daily. 12/01/14  Yes Rigoberto Noel, MD  cetirizine (ZYRTEC) 5 MG tablet Take 5 mg by mouth daily.   Yes [provider]  hydroxypropyl methylcellulose / hypromellose (ISOPTO TEARS / GONIOVISC) 2.5 % ophthalmic solution Place 1 drop into both eyes as needed for dry eyes.   Yes [provider]  levalbuterol (XOPENEX) 0.63 MG/3ML nebulizer solution Take 3 mLs (0.63 mg total) by nebulization every 6 (six) hours as needed for wheezing or shortness of breath. 10/19/16  Yes Rigoberto Noel, MD  losartan-hydrochlorothiazide (HYZAAR) 50-12.5 MG tablet Take 1 tablet by mouth daily.   Yes [provider]  metoprolol succinate (TOPROL-XL) 50 MG 24 hr tablet Take 1 tablet (50 mg total) by mouth 2 (two) times daily. 07/11/16  Yes Thurnell Lose, MD  omeprazole (PRILOSEC) 40 MG capsule Take 40 mg by mouth daily.   Yes [provider]  simvastatin (ZOCOR) 80 MG tablet Take 40 mg by mouth at bedtime.  07/18/14  Yes [provider]  triamcinolone (NASACORT) 55 MCG/ACT AERO nasal inhaler Place 1 spray into the nose daily as needed (Congestion).    Yes [provider]  warfarin (COUMADIN) 2.5 MG tablet Take 2.5 mg by mouth daily.  Yes [provider]  doxycycline (VIBRAMYCIN) 100 MG capsule Take 1 capsule (100 mg total) by mouth 2 (two) times daily. 02/02/17   Jenelle Drennon, PA-C  fluticasone (FLONASE) 50 MCG/ACT nasal spray Place 2 sprays into both nostrils daily.    [provider]  tiotropium (SPIRIVA) 18 MCG inhalation capsule Place 1 capsule (18 mcg total) into inhaler and inhale daily. Patient not taking: Reported on 02/02/2017 07/12/16   Thurnell Lose, MD    Family History Family History  Problem Relation Age of Onset  . Emphysema Father   . Lung cancer Brother   . Liver cancer  Brother   . Diabetes Mother   . Diabetes Sister     Social History Social History  Substance Use Topics  . Smoking status: Former Smoker    Packs/day: 1.00    Years: 30.00    Types: Cigarettes    Quit date: 07/07/2011  . Smokeless tobacco: Never Used  . Alcohol use No     Allergies   Albuterol; Augmentin [amoxicillin-pot clavulanate]; Ciprofloxacin; Corticosteroids; and Latex   Review of Systems Review of Systems  Constitutional: Negative for activity change, appetite change, chills and fever.  HENT: Negative for facial swelling, sore throat and trouble swallowing.   Respiratory: Negative for chest tightness, shortness of breath and wheezing.   Musculoskeletal: Negative for neck pain and neck stiffness.  Skin: Positive for rash. Negative for wound.  Neurological: Negative for dizziness, weakness, numbness and headaches.  All other systems reviewed and are negative.    Physical Exam Updated Vital Signs BP 140/86 (BP Location: Left Arm)   Pulse 61   Temp 97.9 F (36.6 C) (Oral)   Resp 18   SpO2 100%   Physical Exam  Constitutional: He is oriented to person, place, and time. He appears well-developed and well-nourished. No distress.  HENT:  Head: Normocephalic and atraumatic.  Mouth/Throat: Oropharynx is clear and moist.  Neck: Normal range of motion. Neck supple.  Cardiovascular: Normal rate, regular rhythm, normal heart sounds and intact distal pulses.   No murmur heard. Pulmonary/Chest: Effort normal and breath sounds normal. No respiratory distress.  Musculoskeletal: Normal range of motion. He exhibits no edema or tenderness.  Lymphadenopathy:    He has no cervical adenopathy.  Neurological: He is alert and oriented to person, place, and time. He exhibits normal muscle tone. Coordination normal.  Skin: Skin is warm. Rash noted. There is erythema.  Petechial rash to the bilateral upper and lower extremities.  No surrounding erythema, edema or open sores. Soles  of feet and palms are spared.  Nursing note and vitals reviewed.    ED Treatments / Results  Labs (all labs ordered are listed, but only abnormal results are displayed) Labs Reviewed  CBC WITH DIFFERENTIAL/PLATELET - Abnormal; Notable for the following:       Result Value   WBC 11.6 (*)    Neutro Abs 8.6 (*)    Monocytes Absolute 1.3 (*)    All other components within normal limits  BASIC METABOLIC PANEL - Abnormal; Notable for the following:    Chloride 99 (*)    Glucose, Bld 124 (*)    All other components within normal limits  PROTIME-INR - Abnormal; Notable for the following:    Prothrombin Time 24.9 (*)    All other components within normal limits  ROCKY MTN SPOTTED FVR ABS PNL(IGG+IGM)    EKG  EKG Interpretation None       Radiology No results found.  Procedures  Procedures (including critical care time)  Medications Ordered in ED Medications - No data to display   Initial Impression / Assessment and Plan / ED Course  I have reviewed the triage vital signs and the nursing notes.  Pertinent labs & imaging results that were available during my care of the patient were reviewed by me and considered in my medical decision making (see chart for details).     Vitals stable.  Pt well appearing, non-toxic.  Ambulates with steady gait.  Petechial rash of bilateral upper and lower extremities.  INR is therapeutic.  Hx of tick bite.  Will tx pt with doxycycline.  Lyme by PCR and RMSF pending.  Pt appears stable for d/c.  Pt has upcoming PCP appt   Final Clinical Impressions(s) / ED Diagnoses   Final diagnoses:  Rash  Tick bite, initial encounter    New Prescriptions Discharge Medication List as of 02/02/2017 11:28 AM    START taking these medications   Details  doxycycline (VIBRAMYCIN) 100 MG capsule Take 1 capsule (100 mg total) by mouth 2 (two) times daily., Starting Fri 02/02/2017, Print         Yanette Tripoli, PA-C 02/04/17 [place],  [place], DO 02/05/17 2143

## 2017-02-05 LAB — PROTIME-INR
INR: 2.4
Prothrombin Time: 26.6 seconds — ABNORMAL HIGH (ref 11.4–15.2)

## 2017-02-05 NOTE — Discharge Instructions (Signed)
Your INR was 2.4 today. Continue your antibiotics as prescribed until finished; do not stop your antibiotics early. Also, continue your coumadin. Take tylenol for chills, aches, or pains. Get plenty of rest and drink plenty of clear liquids. Follow up with your primary care doctor in 24-48 hours for repeat assessment. You may return for new or concerning symptoms such as uncontrolled vomiting, fever, confusion or disorientation, or any of the symptoms listed on your attached instructions.

## 2017-02-06 LAB — ROCKY MTN SPOTTED FVR ABS PNL(IGG+IGM)
RMSF IgG: NEGATIVE
RMSF IgM: 0.33 index (ref 0.00–0.89)

## 2017-02-07 ENCOUNTER — Ambulatory Visit: Payer: Medicare Other | Admitting: Adult Health

## 2017-02-19 ENCOUNTER — Emergency Department (HOSPITAL_COMMUNITY)
Admission: EM | Admit: 2017-02-19 | Discharge: 2017-02-19 | Disposition: A | Discharge status: Home / Self Care | Payer: Medicare Other | Attending: Emergency Medicine | Admitting: Emergency Medicine

## 2017-02-19 ENCOUNTER — Emergency Department (HOSPITAL_COMMUNITY): Payer: Medicare Other

## 2017-02-19 ENCOUNTER — Encounter (HOSPITAL_COMMUNITY): Payer: Self-pay

## 2017-02-19 DIAGNOSIS — R21 Rash and other nonspecific skin eruption: Secondary | ICD-10-CM

## 2017-02-19 DIAGNOSIS — Z79899 Other long term (current) drug therapy: Secondary | ICD-10-CM | POA: Insufficient documentation

## 2017-02-19 DIAGNOSIS — J449 Chronic obstructive pulmonary disease, unspecified: Secondary | ICD-10-CM | POA: Diagnosis not present

## 2017-02-19 DIAGNOSIS — I10 Essential (primary) hypertension: Secondary | ICD-10-CM | POA: Insufficient documentation

## 2017-02-19 DIAGNOSIS — Z87891 Personal history of nicotine dependence: Secondary | ICD-10-CM | POA: Insufficient documentation

## 2017-02-19 DIAGNOSIS — Z8551 Personal history of malignant neoplasm of bladder: Secondary | ICD-10-CM | POA: Insufficient documentation

## 2017-02-19 DIAGNOSIS — Z7901 Long term (current) use of anticoagulants: Secondary | ICD-10-CM | POA: Diagnosis not present

## 2017-02-19 DIAGNOSIS — R233 Spontaneous ecchymoses: Secondary | ICD-10-CM | POA: Insufficient documentation

## 2017-02-19 DIAGNOSIS — R0602 Shortness of breath: Secondary | ICD-10-CM | POA: Diagnosis not present

## 2017-02-19 DIAGNOSIS — Z91040 Latex allergy status: Secondary | ICD-10-CM | POA: Diagnosis not present

## 2017-02-19 LAB — COMPREHENSIVE METABOLIC PANEL
ALT: 29 U/L (ref 17–63)
AST: 24 U/L (ref 15–41)
Albumin: 3.7 g/dL (ref 3.5–5.0)
Alkaline Phosphatase: 62 U/L (ref 38–126)
Anion gap: 9 (ref 5–15)
BUN: 17 mg/dL (ref 6–20)
CO2: 30 mmol/L (ref 22–32)
Calcium: 9.3 mg/dL (ref 8.9–10.3)
Chloride: 95 mmol/L — ABNORMAL LOW (ref 101–111)
Creatinine, Ser: 0.99 mg/dL (ref 0.61–1.24)
GFR calc Af Amer: >60 mL/min (ref >60)
GFR calc non Af Amer: >60 mL/min (ref >60)
Glucose, Bld: 119 mg/dL — ABNORMAL HIGH (ref 65–99)
Potassium: 4.1 mmol/L (ref 3.5–5.1)
Sodium: 134 mmol/L — ABNORMAL LOW (ref 135–145)
Total Bilirubin: 0.5 mg/dL (ref 0.3–1.2)
Total Protein: 8.2 g/dL — ABNORMAL HIGH (ref 6.5–8.1)

## 2017-02-19 LAB — CBC WITH DIFFERENTIAL/PLATELET
Basophils Absolute: 0 10*3/uL (ref 0.0–0.1)
Basophils Relative: 0 %
Eosinophils Absolute: 0.2 10*3/uL (ref 0.0–0.7)
Eosinophils Relative: 2 %
HCT: 44 % (ref 39.0–52.0)
Hemoglobin: 14.5 g/dL (ref 13.0–17.0)
Lymphocytes Relative: 15 %
Lymphs Abs: 1.9 10*3/uL (ref 0.7–4.0)
MCH: 30.7 pg (ref 26.0–34.0)
MCHC: 33 g/dL (ref 30.0–36.0)
MCV: 93.2 fL (ref 78.0–100.0)
Monocytes Absolute: 1.2 10*3/uL — ABNORMAL HIGH (ref 0.1–1.0)
Monocytes Relative: 10 %
Neutro Abs: 9.4 10*3/uL — ABNORMAL HIGH (ref 1.7–7.7)
Neutrophils Relative %: 73 %
Platelets: 351 10*3/uL (ref 150–400)
RBC: 4.72 MIL/uL (ref 4.22–5.81)
RDW: 14.1 % (ref 11.5–15.5)
WBC: 12.8 10*3/uL — ABNORMAL HIGH (ref 4.0–10.5)

## 2017-02-19 LAB — URINALYSIS, ROUTINE W REFLEX MICROSCOPIC
Bilirubin Urine: NEGATIVE
Glucose, UA: NEGATIVE mg/dL
Hgb urine dipstick: NEGATIVE
Ketones, ur: NEGATIVE mg/dL
Leukocytes, UA: NEGATIVE
Nitrite: NEGATIVE
Protein, ur: NEGATIVE mg/dL
Specific Gravity, Urine: 1.008 (ref 1.005–1.030)
pH: 6 (ref 5.0–8.0)

## 2017-02-19 LAB — PROTIME-INR
INR: 2.55
Prothrombin Time: 27.9 seconds — ABNORMAL HIGH (ref 11.4–15.2)

## 2017-02-19 NOTE — ED Notes (Signed)
Pt ambulatory to waiting room. Pt verbalized understanding of discharge instructions.   

## 2017-02-19 NOTE — ED Notes (Signed)
Family made aware that if they need anything to please call out.

## 2017-02-19 NOTE — ED Triage Notes (Signed)
Patient reports of red rash to extremities, back and buttocks. Was recently treated for RMSF. Completed doxycycline Monday. States rash is getting worse. Also complains of bilateral foot pain, worse in the morning.

## 2017-02-19 NOTE — Discharge Instructions (Signed)
Follow-up with your dermatologist this week if possible. Make an appointment to follow-up with infectious disease at [facility]. Also recommend following up your primary care doctor this week. Today's workup without any specific findings. Return for any new or worse symptoms.

## 2017-02-19 NOTE — ED Provider Notes (Signed)
[facility] DEPT Provider Note   CSN: [id number] Arrival date & time: 02/19/17  1957     History   Chief Complaint Chief Complaint  Patient presents with  . Rash    HPI Matthew Hall is a 71 y.o. male.   Patient presenting with worsening petechial rash. On January 29 he presented with lower extremity petechial rash felt to be secondary to [facility] spotted fever. Titers were sent at that time they are now back and are negative. Patient never had a fever. He was on a 10 day course of doxycycline. Today with spread of the petechial rash to his upper extremities. Rash was never on the palms of his hands or soles of his feet. Patient has seen his primary care doctor the felt that maybe it was not [facility] spotted fever but may been another tickborne illness. Patient has follow-up with dermatology on Wednesday for this. Patient clinically appears nontoxic no acute distress. Patient has a history of atrial fibrillation and is on Coumadin. Patient has not been on any new medications other than the doxycycline.Other than the rash only symptoms patient has a history of some decreased appetite. Feels fatigued.      Past Medical History:  Diagnosis Date  . Anxiety   . Atrial fibrillation ([place])   . Bladder cancer ([place])   . Bladder tumor   . Colon polyps   . COPD (chronic obstructive pulmonary disease) ([place])   . Emphysema/COPD ([place])   . Hypertension   . Kidney stone   . Pneumonia   . Skin cancer     Patient Active Problem List   Diagnosis Date Noted  . Community acquired pneumonia of left lower lobe of lung ([place]) 07/09/2016  . Atrial fibrillation, chronic ([place]) 07/09/2016  . CAP (community acquired pneumonia) 07/09/2016  . BRONCHIECTASIS W/O ACUTE EXACERBATION 03/31/2008  . Essential hypertension 09/13/2007  . Allergic rhinitis 09/13/2007  . EMPHYSEMA 09/13/2007  . COPD exacerbation ([place]) 09/13/2007    Past Surgical History:  Procedure Laterality Date  . bladder  tumor removed    . Kidney stones removed         Home Medications    Prior to Admission medications   Medication Sig Start Date End Date Taking? Authorizing Provider  albuterol (PROVENTIL HFA;VENTOLIN HFA) 108 (90 BASE) MCG/ACT inhaler Inhale 2 puffs into the lungs every 6 (six) hours as needed for wheezing or shortness of breath. 08/03/14  Yes Rigoberto Noel, MD  ALPRAZolam Duanne Moron) 0.25 MG tablet Take 0.25 mg by mouth 2 (two) times daily as needed for anxiety or sleep.  07/01/14  Yes [provider]  budesonide-formoterol (SYMBICORT) 160-4.5 MCG/ACT inhaler Inhale 2 puffs into the lungs 2 (two) times daily. 12/01/14  Yes Rigoberto Noel, MD  cetirizine (ZYRTEC) 5 MG tablet Take 5 mg by mouth daily.   Yes [provider]  fluticasone (FLONASE) 50 MCG/ACT nasal spray Place 2 sprays into both nostrils daily.   Yes [provider]  hydroxypropyl methylcellulose / hypromellose (ISOPTO TEARS / GONIOVISC) 2.5 % ophthalmic solution Place 1 drop into both eyes as needed for dry eyes.   Yes [provider]  levalbuterol (XOPENEX) 0.63 MG/3ML nebulizer solution Take 3 mLs (0.63 mg total) by nebulization every 6 (six) hours as needed for wheezing or shortness of breath. 10/19/16  Yes Rigoberto Noel, MD  losartan-hydrochlorothiazide (HYZAAR) 50-12.5 MG tablet Take 1 tablet by mouth daily.   Yes [provider]  metoprolol succinate (TOPROL-XL) 50 MG  24 hr tablet Take 1 tablet (50 mg total) by mouth 2 (two) times daily. 07/11/16  Yes Thurnell Lose, MD  omeprazole (PRILOSEC) 40 MG capsule Take 40 mg by mouth daily.   Yes [provider]  simvastatin (ZOCOR) 80 MG tablet Take 40 mg by mouth at bedtime.  07/18/14  Yes [provider]  triamcinolone (NASACORT) 55 MCG/ACT AERO nasal inhaler Place 1 spray into the nose daily as needed (Congestion).    Yes [provider]  warfarin (COUMADIN) 2.5 MG tablet Take 2.5 mg by mouth daily.   Yes  [provider]  doxycycline (VIBRAMYCIN) 100 MG capsule Take 1 capsule (100 mg total) by mouth 2 (two) times daily. Patient not taking: Reported on 02/19/2017 02/02/17   Kem Parkinson, PA-C    Family History Family History  Problem Relation Age of Onset  . Emphysema Father   . Lung cancer Brother   . Liver cancer Brother   . Diabetes Mother   . Diabetes Sister     Social History Social History  Substance Use Topics  . Smoking status: Former Smoker    Packs/day: 1.00    Years: 30.00    Types: Cigarettes    Quit date: 07/07/2011  . Smokeless tobacco: Never Used  . Alcohol use No     Allergies   Albuterol; Augmentin [amoxicillin-pot clavulanate]; Ciprofloxacin; Corticosteroids; and Latex   Review of Systems Review of Systems  Constitutional: Positive for fatigue. Negative for fever.  HENT: Negative for congestion.   Eyes: Negative for visual disturbance.  Respiratory: Negative for shortness of breath.   Cardiovascular: Negative for chest pain.  Gastrointestinal: Negative for abdominal pain, diarrhea, nausea and vomiting.  Genitourinary: Negative for dysuria and hematuria.  Musculoskeletal: Negative for myalgias.  Skin: Positive for rash.  Allergic/Immunologic: Negative for immunocompromised state.  Neurological: Negative for syncope and headaches.  Hematological: Bruises/bleeds easily.  Psychiatric/Behavioral: Negative for confusion.     Physical Exam Updated Vital Signs BP (!) 148/85   Pulse 87   Temp 97.8 F (36.6 C) (Oral)   Resp (!) 24   Ht 1.803 m (5\' 11" )   Wt 82.6 kg (182 lb)   SpO2 98%   BMI 25.38 kg/m   Physical Exam  Constitutional: He is oriented to person, place, and time. He appears well-developed and well-nourished. No distress.  HENT:  Head: Normocephalic and atraumatic.  Mouth/Throat: Oropharynx is clear and moist.  Eyes: Pupils are equal, round, and reactive to light. Conjunctivae and EOM are normal.  Neck: Normal range of  motion. Neck supple.  Cardiovascular: Normal rate.   Irregular normal rate  Pulmonary/Chest: Effort normal and breath sounds normal. No respiratory distress.  Abdominal: Soft. Bowel sounds are normal. There is no tenderness.  Musculoskeletal: Normal range of motion. He exhibits no edema.  Neurological: He is alert and oriented to person, place, and time. No cranial nerve deficit or sensory deficit. He exhibits normal muscle tone. Coordination normal.  Skin: Rash noted.  Patient with extensive petechial rash. Upper extremities appear brighter red and more recent.lower extremity petechial rash is deeper in color. The left anterior shin has a few noninfected the larger areas petechiae measuring about a centimeter. No purpura otherwise. No rash on the palms or soles of feet.  Nursing note and vitals reviewed.    ED Treatments / Results  Labs (all labs ordered are listed, but only abnormal results are displayed) Labs Reviewed  CBC WITH DIFFERENTIAL/PLATELET - Abnormal; Notable for the following:  Result Value   WBC 12.8 (*)    Neutro Abs 9.4 (*)    Monocytes Absolute 1.2 (*)    All other components within normal limits  COMPREHENSIVE METABOLIC PANEL - Abnormal; Notable for the following:    Sodium 134 (*)    Chloride 95 (*)    Glucose, Bld 119 (*)    Total Protein 8.2 (*)    All other components within normal limits  URINALYSIS, ROUTINE W REFLEX MICROSCOPIC - Abnormal; Notable for the following:    Color, Urine STRAW (*)    All other components within normal limits  PROTIME-INR - Abnormal; Notable for the following:    Prothrombin Time 27.9 (*)    All other components within normal limits    EKG  EKG Interpretation  Date/Time:  Monday February 19 2017 22:23:01 EDT Ventricular Rate:  83 PR Interval:    QRS Duration: 134 QT Interval:  414 QTC Calculation: 487 R Axis:   -67 Text Interpretation:  Atrial fibrillation Left bundle branch block Confirmed by Fredia Sorrow  [phone]) on 02/19/2017 10:41:07 PM       Radiology Dg Chest 2 View  Result Date: 02/19/2017 CLINICAL DATA:  Acute onset of shortness of breath. Bilateral arm and leg rash. Initial encounter. EXAM: CHEST  2 VIEW COMPARISON:  Chest radiograph performed 08/08/2016 FINDINGS: The lungs are well-aerated. Mild bilateral scarring is noted. No pleural effusion or pneumothorax is seen. The heart is borderline normal in size. No acute osseous abnormalities are seen. IMPRESSION: Mild bilateral scarring noted.  Lungs otherwise grossly clear. Electronically Signed   By: Garald Balding M.D.   On: 02/19/2017 22:06    Procedures Procedures (including critical care time)  Medications Ordered in ED Medications - No data to display   Initial Impression / Assessment and Plan / ED Course  I have reviewed the triage vital signs and the nursing notes.  Pertinent labs & imaging results that were available during my care of the patient were reviewed by me and considered in my medical decision making (see chart for details).     Screening for the rash lab-wise without any sniffing abnormalities. Patient does have a slight hyponatremia. But he had that back in January as well and noticed goes along with [facility] spotted fever patient's platelet count hemoglobin and hematocrits all normal. Urinalysis has no hematuria. INR is therapeutic. Chest x-rays negative. Cardiac monitor shows atrial fibrillation with rate controlled. Patient nontoxic no acute distress.  Have referred patient to infectious disease for additional evaluation. Patient also has follow-up with dermatology on Wednesday. Not convinced that this is related to [facility] spotted fever. Patient's previous labs reviewed. Today's labs without any significant changes or significant findings.  Final Clinical Impressions(s) / ED Diagnoses   Final diagnoses:  Rash  Petechial rash    New Prescriptions New Prescriptions   No medications on file       Fredia Sorrow, MD 02/19/17 2357

## 2017-02-19 NOTE — Telephone Encounter (Signed)
Spoke to Dr. Karna Christmas nurse Andee Poles that it is ok for patient to hold coumadin for 5 days prior to procedure and then resume the day after. She states their fax machines have been down but she will fax the clearance as soon as possible.   Spoke to patient and informed him. He verbalized understanding.

## 2017-02-20 ENCOUNTER — Ambulatory Visit (INDEPENDENT_AMBULATORY_CARE_PROVIDER_SITE_OTHER): Payer: Medicare Other | Admitting: Internal Medicine

## 2017-02-20 ENCOUNTER — Encounter: Payer: Self-pay | Admitting: Internal Medicine

## 2017-02-20 VITALS — BP 124/82 | HR 70 | Temp 97.7°F | Wt 184.0 lb

## 2017-02-20 DIAGNOSIS — I776 Arteritis, unspecified: Secondary | ICD-10-CM | POA: Insufficient documentation

## 2017-02-20 DIAGNOSIS — R233 Spontaneous ecchymoses: Secondary | ICD-10-CM

## 2017-02-20 MED ORDER — DOXYCYCLINE HYCLATE 100 MG PO CAPS: 100.0000 mg | ORAL_CAPSULE | Freq: Two times a day (BID) | ORAL | 0 refills | Status: DC

## 2017-02-20 NOTE — Assessment & Plan Note (Addendum)
Uncertain as to cause - differential including vasculitis vs autoimmune mechanism vs reinfection (high risk for tick exposure where he lives). Although he completed appropriate treatment, he still persists with malaise and generalized aches in setting of worsened diffuse rash so will retreat with 10d course of Doxycycline. Will check BCx, ESR, CRP, ehrlichia antibodies. I have called his dermatology office (has an appt with Dr. Deatra Canter tomorrow afternoon) to request assistance with skin biopsy to see what process may be going on here. If he fails to improve on doxycycline would have a low threshold to begin steroids for vasculitis picture. He will follow up with our clinic pending lab work.

## 2017-02-20 NOTE — Progress Notes (Signed)
Patient Name: Matthew Hall  MRN: [id number]  DOB: Dec 28, 1945    Reason for Visit: petechial rash, tick exposure RMSF?   Referring Provider:  [facility]    INFECTIOUS DISEASE OFFICE CONSULT SUBJECTIVE:  HPI: Matthew Hall is a 71 y.o. male that is here for persistent and spreading petechial rash with known tick bite history. Rash initially presented on ankles at sock line and above to lower calf on 02/01/17. Went to ED Rash initially faded/improved then quickly returned and spread up legs, lower torso/buttocks and posterior sides of arms Sunday 02/18/17. Mostly these are flat, non-pruritic and red but some of the larger lesions that coalesce raise at times.   Lives between [place] and [place], [place]. About 3-4 weeks ago was in [facility] but not in water; mostly indoors and out on the deck. Possible bite marks on sock line of left inner ankle that evolved into blue/black areas and black centers.  Denies fevers, headaches, abdominehl pain. Endorses aches to calves but especially to bottom of feet. Decreased appetite but trying to make himself drink. Reports he initially had improvement while on initial course of Doxy but now with worsened, but stable fatigue, foot/ankle pain, poor appetite and generalized myalgias.    New medications recently include tetanus shot 02/04/17 and Doxycycline started 02/02/17. He is also on coumadin, however this is not a new medication.    Patient Active Problem List   Diagnosis Date Noted  . Petechial eruption 02/20/2017  . Community acquired pneumonia of left lower lobe of lung ([place]) 07/09/2016  . Atrial fibrillation, chronic ([place]) 07/09/2016  . CAP (community acquired pneumonia) 07/09/2016  . BRONCHIECTASIS W/O ACUTE EXACERBATION 03/31/2008  . Essential hypertension 09/13/2007  . Allergic rhinitis 09/13/2007  . EMPHYSEMA 09/13/2007  . COPD exacerbation ([place]) 09/13/2007    Patient's Medications  New Prescriptions   No medications on file    Previous Medications   ALBUTEROL (PROVENTIL HFA;VENTOLIN HFA) 108 (90 BASE) MCG/ACT INHALER    Inhale 2 puffs into the lungs every 6 (six) hours as needed for wheezing or shortness of breath.   ALPRAZOLAM (XANAX) 0.25 MG TABLET    Take 0.25 mg by mouth 2 (two) times daily as needed for anxiety or sleep.    BUDESONIDE-FORMOTEROL (SYMBICORT) 160-4.5 MCG/ACT INHALER    Inhale 2 puffs into the lungs 2 (two) times daily.   CETIRIZINE (ZYRTEC) 5 MG TABLET    Take 5 mg by mouth daily.   FLUTICASONE (FLONASE) 50 MCG/ACT NASAL SPRAY    Place 2 sprays into both nostrils daily.   HYDROXYPROPYL METHYLCELLULOSE / HYPROMELLOSE (ISOPTO TEARS / GONIOVISC) 2.5 % OPHTHALMIC SOLUTION    Place 1 drop into both eyes as needed for dry eyes.   LEVALBUTEROL (XOPENEX) 0.63 MG/3ML NEBULIZER SOLUTION    Take 3 mLs (0.63 mg total) by nebulization every 6 (six) hours as needed for wheezing or shortness of breath.   LOSARTAN-HYDROCHLOROTHIAZIDE (HYZAAR) 50-12.5 MG TABLET    Take 1 tablet by mouth daily.   METOPROLOL SUCCINATE (TOPROL-XL) 50 MG 24 HR TABLET    Take 1 tablet (50 mg total) by mouth 2 (two) times daily.   OMEPRAZOLE (PRILOSEC) 40 MG CAPSULE    Take 40 mg by mouth daily.   SIMVASTATIN (ZOCOR) 80 MG TABLET    Take 40 mg by mouth at bedtime.    TRIAMCINOLONE (NASACORT) 55 MCG/ACT AERO NASAL INHALER    Place 1 spray into the nose daily as needed (Congestion).  WARFARIN (COUMADIN) 2.5 MG TABLET    Take 2.5 mg by mouth daily.  Modified Medications   Modified Medication Previous Medication   DOXYCYCLINE (VIBRAMYCIN) 100 MG CAPSULE doxycycline (VIBRAMYCIN) 100 MG capsule      Take 1 capsule (100 mg total) by mouth 2 (two) times daily.    Take 1 capsule (100 mg total) by mouth 2 (two) times daily.  Discontinued Medications   No medications on file    Review of Systems  Constitutional: Positive for malaise/fatigue. Negative for chills, fever and weight loss.  HENT: Negative for sore throat and tinnitus.        No  dental problems  Eyes: Negative for blurred vision and double vision.  Respiratory: Negative for cough and sputum production.   Cardiovascular: Negative for chest pain and leg swelling.  Gastrointestinal: Negative for abdominal pain, diarrhea and vomiting.  Genitourinary: Negative for dysuria and flank pain.  Musculoskeletal: Positive for myalgias. Negative for joint pain.  Skin: Positive for rash (petechial rash as described above ).  Neurological: Positive for weakness. Negative for dizziness, tingling (feet) and headaches.  Psychiatric/Behavioral: The patient is not nervous/anxious and does not have insomnia.     Past Medical History:  Diagnosis Date  . Anxiety   . Atrial fibrillation ([place])   . Bladder cancer ([place])   . Bladder tumor   . Colon polyps   . COPD (chronic obstructive pulmonary disease) ([place])   . Emphysema/COPD ([place])   . Hypertension   . Kidney stone   . Pneumonia   . Skin cancer     Social History  Substance Use Topics  . Smoking status: Former Smoker    Packs/day: 1.00    Years: 30.00    Types: Cigarettes    Quit date: 07/07/2011  . Smokeless tobacco: Never Used  . Alcohol use No    Family History  Problem Relation Age of Onset  . Emphysema Father   . Lung cancer Brother   . Liver cancer Brother   . Diabetes Mother   . Diabetes Sister      Allergies  Allergen Reactions  . Albuterol Other (See Comments)    Sent into afib  . Augmentin [Amoxicillin-Pot Clavulanate] Rash  . Ciprofloxacin Rash  . Corticosteroids Rash    Patient is unaware of this allergy  . Latex Hives and Rash     OBJECTIVE: Vitals:   02/20/17 1012  BP: 124/82  Pulse: 70  Temp: 97.7 F (36.5 C)  Weight: 184 lb (83.5 kg)   Body mass index is 25.66 kg/m.  Physical Exam  Constitutional: He is oriented to person, place, and time and well-developed, well-nourished, and in no distress.  Eyes: Pupils are equal, round, and reactive to light. No scleral icterus.  Neck:  Normal range of motion.  Cardiovascular: Normal rate, regular rhythm, normal heart sounds and intact distal pulses.   No murmur heard. Pulmonary/Chest: Effort normal and breath sounds normal. No respiratory distress.  Abdominal: Soft. Bowel sounds are normal. He exhibits distension. There is no tenderness.  Musculoskeletal: Normal range of motion.  Lymphadenopathy:    He has no cervical adenopathy.  Neurological: He is alert and oriented to person, place, and time.  Skin: Skin is warm and dry. Petechiae and rash noted.     Diffuse bright/deep red petechial eruption present on underside of arms, legs, buttocks and feet. Some involvement of lower abdomen and flank areas as well as soles of feet to a lesser extent and not as vivid  red. Most severe on underside of arms where areas are coalesced. Occasionally there are raised papular areas but most of the distribution is flat.      ASSESSMENT & PLAN:  Problem List Items Addressed This Visit      Musculoskeletal and Integument   Petechial eruption - Primary    Uncertain as to cause - differential including vasculitis vs autoimmune mechanism vs reinfection (high risk for tick exposure where he lives). Although he completed appropriate treatment, he still persists with malaise and generalized aches in setting of worsened diffuse rash so will retreat with 10d course of Doxycycline. Will check BCx, ESR, CRP, ehrlichia antibodies. I have called his dermatology office (has an appt with Dr. Deatra Canter tomorrow afternoon) to request assistance with skin biopsy to see what process may be going on here. If he fails to improve on doxycycline would have a low threshold to begin steroids for vasculitis picture. He will follow up with our clinic pending lab work.       Relevant Medications   doxycycline (VIBRAMYCIN) 100 MG capsule   Other Relevant Orders   Sed Rate (ESR)   C-reactive protein   Culture, blood (single)   Culture, blood (single)    Ehrlichia antibody panel      Janene Madeira, MSN, [facility] for Infectious [facility]  02/20/2017  5:10 PM

## 2017-02-20 NOTE — Patient Instructions (Addendum)
Please have your dermatologist get a biopsy of your skin. Unsure if this is related to re-infection from possible tick borne illness or vasculitis related issue. Fax # for Infectious Disease clinic is [phone] please.   We will check some blood work today and call you with results. Follow up will be determined from these.   Please restart the Doxycycline treatment 100 mg twice a day for 10 days - make an appointment with coumadin clinic for levels Thursday or Friday of this week as this can increase bleeding potential.

## 2017-02-21 ENCOUNTER — Encounter: Payer: Self-pay | Admitting: Infectious Diseases

## 2017-02-21 LAB — SEDIMENTATION RATE: Sed Rate: 7 mm/hr (ref 0–20)

## 2017-02-21 LAB — C-REACTIVE PROTEIN: CRP: 36 mg/L — ABNORMAL HIGH (ref <8.0)

## 2017-02-22 ENCOUNTER — Encounter: Payer: Self-pay | Admitting: Infectious Diseases

## 2017-02-22 ENCOUNTER — Other Ambulatory Visit: Payer: Self-pay | Admitting: Infectious Diseases

## 2017-02-22 DIAGNOSIS — R233 Spontaneous ecchymoses: Secondary | ICD-10-CM

## 2017-02-23 ENCOUNTER — Ambulatory Visit (INDEPENDENT_AMBULATORY_CARE_PROVIDER_SITE_OTHER): Payer: Medicare Other | Admitting: Infectious Diseases

## 2017-02-23 ENCOUNTER — Encounter: Payer: Self-pay | Admitting: Infectious Diseases

## 2017-02-23 ENCOUNTER — Ambulatory Visit (HOSPITAL_COMMUNITY)
Admission: RE | Admit: 2017-02-23 | Discharge: 2017-02-23 | Disposition: A | Discharge status: Home / Self Care | Payer: Medicare Other | Source: Ambulatory Visit | Attending: Infectious Diseases | Admitting: Infectious Diseases

## 2017-02-23 ENCOUNTER — Other Ambulatory Visit: Payer: Medicare Other

## 2017-02-23 VITALS — BP 113/75 | HR 92 | Temp 97.6°F

## 2017-02-23 DIAGNOSIS — M7989 Other specified soft tissue disorders: Secondary | ICD-10-CM

## 2017-02-23 DIAGNOSIS — I776 Arteritis, unspecified: Secondary | ICD-10-CM

## 2017-02-23 DIAGNOSIS — R233 Spontaneous ecchymoses: Secondary | ICD-10-CM

## 2017-02-23 LAB — CBC WITH DIFFERENTIAL/PLATELET
Basophils Absolute: 0 cells/uL (ref 0–200)
Basophils Relative: 0 %
Eosinophils Absolute: 105 cells/uL (ref 15–500)
Eosinophils Relative: 1 %
HCT: 44.2 % (ref 38.5–50.0)
Hemoglobin: 14.6 g/dL (ref 13.2–17.1)
Lymphocytes Relative: 14 %
Lymphs Abs: 1470 cells/uL (ref 850–3900)
MCH: 31 pg (ref 27.0–33.0)
MCHC: 33 g/dL (ref 32.0–36.0)
MCV: 93.8 fL (ref 80.0–100.0)
MPV: 10 fL (ref 7.5–12.5)
Monocytes Absolute: 735 cells/uL (ref 200–950)
Monocytes Relative: 7 %
Neutro Abs: 8190 cells/uL — ABNORMAL HIGH (ref 1500–7800)
Neutrophils Relative %: 78 %
Platelets: 396 10*3/uL (ref 140–400)
RBC: 4.71 MIL/uL (ref 4.20–5.80)
RDW: 14.8 % (ref 11.0–15.0)
WBC: 10.5 10*3/uL (ref 3.8–10.8)

## 2017-02-23 LAB — EHRLICHIA ANTIBODY PANEL
E chaffeensis (HGE) Ab, IgG: <1:64 {titer}
E chaffeensis (HGE) Ab, IgM: <1:20 {titer}

## 2017-02-23 MED ORDER — PREDNISONE 20 MG PO TABS: 40.0000 mg | ORAL_TABLET | Freq: Every day | ORAL | 3 refills | Status: DC

## 2017-02-23 NOTE — Progress Notes (Signed)
*  Preliminary Results* Bilateral lower extremity venous duplex completed. Bilateral lower extremities are negative for deep vein thrombosis. There is no evidence of Baker's cyst bilaterally.  02/23/2017 2:41 PM Maudry Mayhew, BS, RVT, RDCS, RDMS

## 2017-02-23 NOTE — Assessment & Plan Note (Addendum)
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 CRP elevated with normal sed rate. Does not appear to be allergic component as his eosinophil count is normal. No alteration in organ function - will reassess again. He has had improvement and fading of his rash with resuming Doxycycline. Also seems with more energy today. He is complaining of lower extremity/foot pain and altered sensation. Will assess ABI and Venous Duplex with altered sensation and pain.   Overall still very suspicious for tick illness induced as he had some factors that point to this (history, location, hyponatremia, response to Doxycycline from first course). I will continue Doxycycline today and add Prednisone 40 mg QD to see if this helps continue improvement - will need to taper this at some point. I will have him follow up with coumadin clinic closely to monitor for medication interaction. Will recheck RMSF Ab today as well in addition to ANCA titers. He will follow up with me again in 2 weeks to reassess condition.

## 2017-02-23 NOTE — Progress Notes (Signed)
Patient Name: Matthew Hall  MRN: [id number]  DOB: 07-05-1946    Reason for Visit: FU on Rash   Dermatologist - J. Szulecki PCP - Marjean Donna [place], [place])   SUBJECTIVE:  HPI: Matthew Hall is a 71 y.o. 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 3 day follow up today after resuming Doxycycline and his rash is overall improved and starting to fade / recede. He went to see his dermatologist and had 2 biopsies taken; her impression was also that this was a vasculitis presentation.   Today he continues to be without fevers, abdominal pain, nausea, diarrhea. Appetite and energy improved a bit but nervous about overall condition and prognosis as we don't know exactly what this is yet. Worsening pain to soles of his feet and now with some swelling L>R. He also reports altered temperature sensation (cool hands feel hot to him). Uncertain if his pre-diabetic status is contributing to this, however this started abruptly in association with rash. Wondering about cause of rash being related to changing from brand name to generic coumadin that preceeded dissemination of rash - reported he started with scabbed rash to ankles prior to this change.    Patient Active Problem List   Diagnosis Date Noted  . Vasculitis ([place]) 02/20/2017  . Atrial fibrillation, chronic ([place]) 07/09/2016  . BRONCHIECTASIS W/O ACUTE EXACERBATION 03/31/2008  . Essential hypertension 09/13/2007  . Allergic rhinitis 09/13/2007  . EMPHYSEMA 09/13/2007  . COPD exacerbation ([place]) 09/13/2007    Patient's Medications  New Prescriptions   PREDNISONE (DELTASONE) 20 MG TABLET    Take 2 tablets (40 mg total) by mouth daily with breakfast.  Previous Medications   ALBUTEROL (PROVENTIL HFA;VENTOLIN HFA) 108 (90 BASE) MCG/ACT INHALER    Inhale 2 puffs into the lungs every 6 (six) hours as needed for wheezing or shortness of breath.   ALPRAZOLAM (XANAX) 0.25 MG TABLET    Take 0.25 mg by mouth 2 (two) times daily as needed for anxiety or sleep.    BUDESONIDE-FORMOTEROL (SYMBICORT) 160-4.5 MCG/ACT INHALER    Inhale 2 puffs into the lungs 2 (two) times daily.   CETIRIZINE (ZYRTEC) 5 MG TABLET    Take 5 mg by mouth daily.   DOXYCYCLINE (VIBRAMYCIN) 100 MG CAPSULE    Take 1 capsule (100 mg total) by mouth 2 (two) times daily.   FLUTICASONE (FLONASE) 50 MCG/ACT NASAL SPRAY    Place 2 sprays into both nostrils daily.   HYDROXYPROPYL METHYLCELLULOSE / HYPROMELLOSE (ISOPTO TEARS / GONIOVISC) 2.5 % OPHTHALMIC SOLUTION    Place 1 drop into both eyes as needed for dry eyes.   LEVALBUTEROL (XOPENEX) 0.63 MG/3ML NEBULIZER SOLUTION    Take 3 mLs (0.63 mg total) by nebulization every 6 (six) hours as needed for wheezing or shortness of breath.   LOSARTAN-HYDROCHLOROTHIAZIDE (HYZAAR) 50-12.5 MG TABLET    Take 1 tablet by mouth daily.   METOPROLOL SUCCINATE (TOPROL-XL) 50 MG 24 HR TABLET    Take 1 tablet (50 mg total) by mouth 2 (two) times daily.   OMEPRAZOLE (PRILOSEC) 40 MG CAPSULE    Take 40 mg by mouth daily.   SIMVASTATIN (ZOCOR) 80 MG TABLET    Take 40 mg by mouth at bedtime.    TRIAMCINOLONE (NASACORT) 55 MCG/ACT AERO NASAL INHALER    Place 1 spray into the nose daily as needed (Congestion).    WARFARIN (COUMADIN) 2.5 MG TABLET  Take 2.5 mg by mouth daily.  Modified Medications   No medications on file  Discontinued Medications   No medications on file    Review of Systems  Constitutional: Positive for malaise/fatigue. Negative for chills and fever.  Eyes: Negative for blurred vision.  Respiratory: Negative for cough, sputum production and shortness of breath.   Cardiovascular: Positive for claudication and leg swelling. Negative for chest pain and palpitations.  Gastrointestinal: Negative for abdominal pain, nausea and vomiting.  Musculoskeletal: Positive for joint pain (feet). Negative for myalgias.  Skin: Positive for rash.  Negative for itching.  Neurological: Positive for tingling (feet) and sensory change (feet). Negative for dizziness and headaches.  Psychiatric/Behavioral: The patient is nervous/anxious.     Past Medical History:  Diagnosis Date  . Anxiety   . Atrial fibrillation ([place])   . Bladder cancer ([place])   . Bladder tumor   . Colon polyps   . COPD (chronic obstructive pulmonary disease) ([place])   . Emphysema/COPD ([place])   . Hypertension   . Kidney stone   . Pneumonia   . Skin cancer     Allergies  Allergen Reactions  . Albuterol Other (See Comments)    Sent into afib  . Augmentin [Amoxicillin-Pot Clavulanate] Rash  . Ciprofloxacin Rash  . Corticosteroids Rash    Patient is unaware of this allergy  . Latex Hives and Rash     OBJECTIVE: Vitals:   02/23/17 1030  BP: 113/75  Pulse: 92  Temp: 97.6 F (36.4 C)   There is no height or weight on file to calculate BMI.  Physical Exam  Constitutional: He is oriented to person, place, and time and well-developed, well-nourished, and in no distress.  Eyes: Pupils are equal, round, and reactive to light.  Cardiovascular: Normal rate, regular rhythm, normal heart sounds and intact distal pulses.   No murmur heard. Pulmonary/Chest: Effort normal and breath sounds normal. No respiratory distress. He has no rales.  Abdominal: He exhibits no distension.  Lymphadenopathy:    He has no cervical adenopathy.  Neurological: He is alert and oriented to person, place, and time. He has normal strength.  Skin: Petechiae, purpura and rash noted.  New macular rash to face that is light pink in color.  Improved disseminated petechial rash overall and it is now faded and receded especially on lower extremities. Purpura/hemorrhagic bullae to LLE appear almost necrosed / ulcerated   Psychiatric: Affect normal.          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 CRP elevated with normal sed rate. Does not appear to be allergic component as his eosinophil count is normal. No alteration in organ function - will reassess again. He has had improvement and fading of his rash with resuming Doxycycline. Also seems with more energy today. He is complaining of lower extremity/foot pain and altered sensation. Will assess ABI and Venous Duplex with altered sensation and pain.   Overall still very suspicious for tick illness induced as he had some factors that point to this (history, location, hyponatremia, response to Doxycycline from first course). I will continue Doxycycline today and add Prednisone 40 mg QD to see if this helps continue improvement - will need to taper this at some point. I will have him follow up with coumadin clinic closely to  monitor for medication interaction. Will recheck RMSF Ab today as well in addition to ANCA titers. He will follow up with me again in 2 weeks to reassess condition.          Janene Madeira, MSN, [facility] for Infectious Disease [facility]  02/23/2017  4:53 PM

## 2017-02-24 LAB — COMPREHENSIVE METABOLIC PANEL
ALT: 23 U/L (ref 9–46)
AST: 21 U/L (ref 10–35)
Albumin: 3.8 g/dL (ref 3.6–5.1)
Alkaline Phosphatase: 71 U/L (ref 40–115)
BUN: 16 mg/dL (ref 7–25)
CO2: 21 mmol/L (ref 20–32)
Calcium: 9.2 mg/dL (ref 8.6–10.3)
Chloride: 97 mmol/L — ABNORMAL LOW (ref 98–110)
Creat: 1.03 mg/dL (ref 0.70–1.18)
Glucose, Bld: 164 mg/dL — ABNORMAL HIGH (ref 65–99)
Potassium: 3.9 mmol/L (ref 3.5–5.3)
Sodium: 138 mmol/L (ref 135–146)
Total Bilirubin: 0.7 mg/dL (ref 0.2–1.2)
Total Protein: 7.5 g/dL (ref 6.1–8.1)

## 2017-02-26 ENCOUNTER — Ambulatory Visit (HOSPITAL_COMMUNITY)
Admission: RE | Admit: 2017-02-26 | Discharge: 2017-02-26 | Disposition: A | Discharge status: Home / Self Care | Payer: Medicare Other | Source: Ambulatory Visit | Attending: Infectious Diseases | Admitting: Infectious Diseases

## 2017-02-26 DIAGNOSIS — M79604 Pain in right leg: Secondary | ICD-10-CM | POA: Insufficient documentation

## 2017-02-26 DIAGNOSIS — I776 Arteritis, unspecified: Secondary | ICD-10-CM | POA: Diagnosis present

## 2017-02-26 DIAGNOSIS — M7989 Other specified soft tissue disorders: Secondary | ICD-10-CM | POA: Diagnosis not present

## 2017-02-26 DIAGNOSIS — M79605 Pain in left leg: Secondary | ICD-10-CM | POA: Diagnosis present

## 2017-02-26 LAB — ROCKY MTN SPOTTED FVR ABS PNL(IGG+IGM)
RMSF IgG: NOT DETECTED
RMSF IgM: NOT DETECTED

## 2017-02-26 LAB — CULTURE, BLOOD (SINGLE)
Organism ID, Bacteria: NO GROWTH
Organism ID, Bacteria: NO GROWTH

## 2017-02-26 LAB — C-ANCA TITER: C-ANCA: <1:20 {titer}

## 2017-02-26 LAB — RFLX ATYPICAL P-ANCA TITER: Atypical P-ANCA titer: <1:20 {titer}

## 2017-02-26 LAB — RFLX P-ANCA TITER: P-ANCA: <1:20 {titer}

## 2017-02-26 NOTE — Progress Notes (Signed)
VASCULAR LAB PRELIMINARY  ARTERIAL  ABI completed:    RIGHT    LEFT    PRESSURE WAVEFORM  PRESSURE WAVEFORM  BRACHIAL 129 Triphasic BRACHIAL 133 Triphasic  DP 165 Triphasic DP 155 Triphasic  PT 165 Biphasic PT 145 Triphasic  GREAT TOE 99 NA GREAT TOE 105 NA    RIGHT LEFT  ABI / TBI 1.24 / 0.74 1.17 /00.79   ABIs and Doppler waveforms indicate normal arteial flow bilaterally at est. TBI are normal bilaterally.  Elia Keenum, [place], RVS 02/26/2017, 12:37 PM

## 2017-02-27 ENCOUNTER — Encounter: Payer: Self-pay | Admitting: Infectious Diseases

## 2017-02-28 LAB — GLUCOSE 6 PHOSPHATE DEHYDROGENASE: G-6PDH: 15.7 U/g Hgb (ref 7.0–20.5)

## 2017-03-01 ENCOUNTER — Ambulatory Visit (INDEPENDENT_AMBULATORY_CARE_PROVIDER_SITE_OTHER): Payer: Medicare Other | Admitting: Acute Care

## 2017-03-01 ENCOUNTER — Encounter: Payer: Self-pay | Admitting: Acute Care

## 2017-03-01 DIAGNOSIS — J449 Chronic obstructive pulmonary disease, unspecified: Secondary | ICD-10-CM | POA: Diagnosis not present

## 2017-03-01 DIAGNOSIS — J189 Pneumonia, unspecified organism: Secondary | ICD-10-CM

## 2017-03-01 DIAGNOSIS — I776 Arteritis, unspecified: Secondary | ICD-10-CM | POA: Diagnosis not present

## 2017-03-01 NOTE — Assessment & Plan Note (Signed)
Resolved Chest x-ray August 2018 is grossly unremarkable

## 2017-03-01 NOTE — Progress Notes (Signed)
History of Present Illness Matthew Hall is a 71 y.o. male former smoker ( quit 2012) with moderate COPD. He is followed by Dr. Elsworth Soho.   03/01/2017 Follow up for CAP: Pt. Is here for a 6 month follow up after CAP, and  COPD. He was diagnosed with CAP 07/2016. He was treated with antibiotics and has done well since. Pneumonia was resolved per CXR 08/09/2016. Pt. Had CXR 02/2017 which shows continued clear lungs.He has no complaints. He states he is doing well on his Symbicort and Spiriva.He states he uses them as needed but not every day. He uses his Xopenex treatments as needed, which is rare, last time 6 weeks ago. He currently has a rash which has been biopsied. 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 He is on prednisone 40 mg daily x 20 additional days. He is being followed by his PCP in dermatology. He no fever, chest pain, orthopnea of hemoptysis.   Test Results: CXR 02/19/2017 Mild bilateral scarring noted.  Lungs otherwise grossly clear.  CBC Latest Ref Rng & Units 02/23/2017 02/19/2017 02/04/2017  WBC 3.8 - 10.8 K/uL 10.5 12.8(H) 12.2(H)  Hemoglobin 13.2 - 17.1 g/dL 14.6 14.5 14.0  Hematocrit 38.5 - 50.0 % 44.2 44.0 41.6  Platelets 140 - 400 K/uL 396 351 311    BMP Latest Ref Rng & Units 02/23/2017 02/19/2017 02/04/2017  Glucose 65 - 99 mg/dL 164(H) 119(H) 175(H)  BUN 7 - 25 mg/dL 16 17 12   Creatinine 0.70 - 1.18 mg/dL 1.03 0.99 0.96  Sodium 135 - 146 mmol/L 138 134(L) 132(L)  Potassium 3.5 - 5.3 mmol/L 3.9 4.1 3.4(L)  Chloride 98 - 110 mmol/L 97(L) 95(L) 98(L)  CO2 20 - 32 mmol/L 21 30 26   Calcium 8.6 - 10.3 mg/dL 9.2 9.3 9.0    BNP    Component Value Date/Time   BNP 83.0 07/09/2016 0517    ProBNP No results found for: PROBNP  PFT No results found for: FEV1PRE, FEV1POST, FVCPRE, FVCPOST, TLC, DLCOUNC, PREFEV1FVCRT, PSTFEV1FVCRT  Dg Chest 2 View  Result Date: 02/19/2017 CLINICAL DATA:  Acute onset of shortness of breath. Bilateral arm and leg rash. Initial  encounter. EXAM: CHEST  2 VIEW COMPARISON:  Chest radiograph performed 08/08/2016 FINDINGS: The lungs are well-aerated. Mild bilateral scarring is noted. No pleural effusion or pneumothorax is seen. The heart is borderline normal in size. No acute osseous abnormalities are seen. IMPRESSION: Mild bilateral scarring noted.  Lungs otherwise grossly clear. Electronically Signed   By: Garald Balding M.D.   On: 02/19/2017 22:06     Past medical hx Past Medical History:  Diagnosis Date  . Anxiety   . Atrial fibrillation ([place])   . Bladder cancer ([place])   . Bladder tumor   . Colon polyps   . COPD (chronic obstructive pulmonary disease) ([place])   . Emphysema/COPD ([place])   . Hypertension   . Kidney stone   . Pneumonia   . Skin cancer      Social History  Substance Use Topics  . Smoking status: Former Smoker    Packs/day: 1.00    Years: 30.00    Types: Cigarettes    Quit date: 07/07/2011  . Smokeless tobacco: Never Used  . Alcohol use No    Mr.Valdes reports that he quit smoking about 5 years ago. His smoking use included Cigarettes. He has a 30.00 pack-year smoking history. He has never used smokeless tobacco. He reports that he does not drink alcohol or use drugs.  Tobacco  Cessation: Former smoker quit 2012 with a 30-pack-year smoking history  Past surgical hx, Family hx, Social hx all reviewed.  Current Outpatient Prescriptions on File Prior to Visit  Medication Sig  . albuterol (PROVENTIL HFA;VENTOLIN HFA) 108 (90 BASE) MCG/ACT inhaler Inhale 2 puffs into the lungs every 6 (six) hours as needed for wheezing or shortness of breath.  . ALPRAZolam (XANAX) 0.25 MG tablet Take 0.25 mg by mouth 2 (two) times daily as needed for anxiety or sleep.   . budesonide-formoterol (SYMBICORT) 160-4.5 MCG/ACT inhaler Inhale 2 puffs into the lungs 2 (two) times daily.  . cetirizine (ZYRTEC) 5 MG tablet Take 5 mg by mouth daily.  Marland Kitchen doxycycline (VIBRAMYCIN) 100 MG capsule Take 1 capsule (100 mg total)  by mouth 2 (two) times daily.  . hydroxypropyl methylcellulose / hypromellose (ISOPTO TEARS / GONIOVISC) 2.5 % ophthalmic solution Place 1 drop into both eyes as needed for dry eyes.  Marland Kitchen levalbuterol (XOPENEX) 0.63 MG/3ML nebulizer solution Take 3 mLs (0.63 mg total) by nebulization every 6 (six) hours as needed for wheezing or shortness of breath.  . losartan-hydrochlorothiazide (HYZAAR) 50-12.5 MG tablet Take 1 tablet by mouth daily.  . metoprolol succinate (TOPROL-XL) 50 MG 24 hr tablet Take 1 tablet (50 mg total) by mouth 2 (two) times daily.  Marland Kitchen omeprazole (PRILOSEC) 40 MG capsule Take 40 mg by mouth daily.  . predniSONE (DELTASONE) 20 MG tablet Take 2 tablets (40 mg total) by mouth daily with breakfast.  . triamcinolone (NASACORT) 55 MCG/ACT AERO nasal inhaler Place 1 spray into the nose daily as needed (Congestion).   . warfarin (COUMADIN) 2.5 MG tablet Take 2.5 mg by mouth daily.   No current facility-administered medications on file prior to visit.      Allergies  Allergen Reactions  . Albuterol Other (See Comments)    Sent into afib  . Augmentin [Amoxicillin-Pot Clavulanate] Rash  . Ciprofloxacin Rash  . Corticosteroids Rash    Patient is unaware of this allergy  . Latex Hives and Rash    Review Of Systems:  Constitutional:   No  weight loss, night sweats,  Fevers, chills, fatigue, or  lassitude.  HEENT:   No headaches,  Difficulty swallowing,  Tooth/dental problems, or  Sore throat,                No sneezing, itching, ear ache, nasal congestion, post nasal drip,   CV:  No chest pain,  Orthopnea, PND, swelling in lower extremities, anasarca, dizziness, palpitations, syncope.   GI  No heartburn, indigestion, abdominal pain, nausea, vomiting, diarrhea, change in bowel habits, loss of appetite, bloody stools.   Resp: No shortness of breath with exertion or at rest.  No excess mucus, no productive cough,  No non-productive cough,  No coughing up of blood.  No change in color  of mucus.  No wheezing.  No chest wall deformity  Skin: Positive rash or lesions.  GU: no dysuria, change in color of urine, no urgency or frequency.  No flank pain, no hematuria   MS:  No joint pain or swelling.  No decreased range of motion.  No back pain.  Psych:  No change in mood or affect. No depression or anxiety.  No memory loss.   Vital Signs BP 116/70 (BP Location: Left Arm, Patient Position: Sitting, Cuff Size: Normal)   Pulse 60   Ht 5' 11.5" (1.816 m)   Wt 184 lb (83.5 kg)   SpO2 100%   BMI 25.31 kg/m  Physical Exam:  General- No distress,  A&Ox3, pleasant ENT: No sinus tenderness, TM clear, pale nasal mucosa, no oral exudate,no post nasal drip, no LAN Cardiac: S1, S2, regular rate and rhythm, no murmur Chest: No wheeze/ rales/ dullness; no accessory muscle use, no nasal flaring, no sternal retractions Abd.: Soft Non-tender, non distended, bowel sounds positive Ext: No clubbing cyanosis, edema Neuro:  normal strength Skin: Positive vascular rash to all extremities and trunk, bandages at sites where biopsies were obtained, scin warm and dry Psych: normal mood and behavior   Assessment/Plan  CAP (community acquired pneumonia) Resolved Chest x-ray August 2018 is grossly unremarkable  COPD without exacerbation ([place]) COPD  stable on current regimen Plan Continue your Symbicort and Spiriva as you are doing. Rinse mouth after use. Continue using your rescue inhaler and Xopenex nebs as needed for breakthrough shortness of breath or wheezing. Follow up with Dr. Elsworth Soho in 6 months Flu shot once vasculitis rash has cleared. Please contact office for sooner follow up if symptoms do not improve or worsen or seek emergency care     Vasculitis [facility]) Follow-up with ID and dermatology Continue prednisone per ID    Magdalen Spatz, NP 03/01/2017  1:56 PM

## 2017-03-01 NOTE — Patient Instructions (Signed)
It is nice to see you today. Your CXR is clear. Continue your Symbicort and Spiriva as you are doing. Rinse mouth after use. Continue using your rescue inhaler and Xopenex nebs as needed for breakthrough shortness of breath or wheezing. Follow up with Dr. Elsworth Soho in 6 months Flu shot once vasculitis rash has cleared. Please contact office for sooner follow up if symptoms do not improve or worsen or seek emergency care

## 2017-03-01 NOTE — Assessment & Plan Note (Addendum)
Follow-up with ID and dermatology Continue prednisone per ID

## 2017-03-01 NOTE — Assessment & Plan Note (Signed)
COPD  stable on current regimen Plan Continue your Symbicort and Spiriva as you are doing. Rinse mouth after use. Continue using your rescue inhaler and Xopenex nebs as needed for breakthrough shortness of breath or wheezing. Follow up with Dr. Elsworth Soho in 6 months Flu shot once vasculitis rash has cleared. Please contact office for sooner follow up if symptoms do not improve or worsen or seek emergency care

## 2017-03-02 ENCOUNTER — Encounter: Payer: Self-pay | Admitting: Infectious Diseases

## 2017-03-06 ENCOUNTER — Encounter: Payer: Self-pay | Admitting: Infectious Diseases

## 2017-03-06 ENCOUNTER — Ambulatory Visit (INDEPENDENT_AMBULATORY_CARE_PROVIDER_SITE_OTHER): Payer: Medicare Other | Admitting: Infectious Diseases

## 2017-03-06 DIAGNOSIS — I776 Arteritis, unspecified: Secondary | ICD-10-CM

## 2017-03-06 NOTE — Progress Notes (Signed)
Patient Name: MITCHAEL LUCKEY  MRN: [id number]  DOB: 1946/05/22    Reason for Visit: FU on Rash   Dermatologist - J. Szulecki PCP - Marjean Donna [place], [place])   SUBJECTIVE:  HPI: ARGIE APPLEGATE is a 71 y.o. 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 3 day follow up today after resuming Doxycycline and his rash is overall improved and starting to fade / recede. Re-trialed on Doxycycline while blood work was pending. 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 Biopsy back revealing lymphocytic vasculitis.   Today he continues to be without fevers, abdominal pain, nausea, diarrhea. Appetite and energy have nearly returned to normal; he is even driving by himself now. Pain to feet is improved and feels like it is on the mend. Tolerating current dose of prednisone well without side effects. Rash has really faded on extremities but still on stomach. Ulcerated areas have fallen off and he is treating with Vaseline to keep moist. Overall happy with improvement but wondering what brought this on. Completed Doxycycline over a week ago.    Patient Active Problem List   Diagnosis Date Noted  . COPD without exacerbation ([place]) 03/01/2017  . 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 Atrial fibrillation, chronic ([place]) 07/09/2016  . BRONCHIECTASIS W/O ACUTE EXACERBATION 03/31/2008  . Essential hypertension 09/13/2007  . Allergic rhinitis 09/13/2007  . EMPHYSEMA 09/13/2007  . COPD exacerbation ([place]) 09/13/2007    Patient's Medications  New Prescriptions   No medications on file  Previous Medications   ALBUTEROL (PROVENTIL HFA;VENTOLIN HFA) 108 (90 BASE) MCG/ACT INHALER    Inhale 2 puffs into the lungs every 6 (six) hours as needed for wheezing or shortness of breath.   ALPRAZOLAM (XANAX) 0.25 MG TABLET    Take 0.25 mg by mouth 2 (two) times daily as needed for  anxiety or sleep.    BUDESONIDE-FORMOTEROL (SYMBICORT) 160-4.5 MCG/ACT INHALER    Inhale 2 puffs into the lungs 2 (two) times daily.   CETIRIZINE (ZYRTEC) 5 MG TABLET    Take 5 mg by mouth daily.   DOXYCYCLINE (VIBRAMYCIN) 100 MG CAPSULE    Take 1 capsule (100 mg total) by mouth 2 (two) times daily.   HYDROXYPROPYL METHYLCELLULOSE / HYPROMELLOSE (ISOPTO TEARS / GONIOVISC) 2.5 % OPHTHALMIC SOLUTION    Place 1 drop into both eyes as needed for dry eyes.   LEVALBUTEROL (XOPENEX) 0.63 MG/3ML NEBULIZER SOLUTION    Take 3 mLs (0.63 mg total) by nebulization every 6 (six) hours as needed for wheezing or shortness of breath.   LOSARTAN-HYDROCHLOROTHIAZIDE (HYZAAR) 50-12.5 MG TABLET    Take 1 tablet by mouth daily.   METOPROLOL SUCCINATE (TOPROL-XL) 50 MG 24 HR TABLET    Take 1 tablet (50 mg total) by mouth 2 (two) times daily.   OMEPRAZOLE (PRILOSEC) 40 MG CAPSULE    Take 40 mg by mouth daily.   PREDNISONE (DELTASONE) 20 MG TABLET    Take 2 tablets (40 mg total) by mouth daily with breakfast.   TRIAMCINOLONE (NASACORT) 55 MCG/ACT AERO NASAL INHALER    Place 1 spray into the nose daily as needed (Congestion).    WARFARIN (COUMADIN) 2.5 MG TABLET    Take 2.5 mg by mouth daily.  Modified Medications   No medications on file  Discontinued Medications   No medications on file    Review of Systems  Constitutional: Negative for chills, fever  and malaise/fatigue.  Eyes: Negative for blurred vision.  Respiratory: Negative for cough and shortness of breath.   Cardiovascular: Negative for chest pain, palpitations, claudication and leg swelling.  Gastrointestinal: Negative for abdominal pain, nausea and vomiting.  Musculoskeletal: Positive for joint pain (feet). Negative for myalgias.  Skin: Positive for rash. Negative for itching.  Neurological: Negative for dizziness, tingling, sensory change and headaches.  Psychiatric/Behavioral: The patient is not nervous/anxious.     Past Medical History:  Diagnosis  Date  . Anxiety   . Atrial fibrillation ([place])   . Bladder cancer ([place])   . Bladder tumor   . Colon polyps   . COPD (chronic obstructive pulmonary disease) ([place])   . Emphysema/COPD ([place])   . Hypertension   . Kidney stone   . Pneumonia   . Skin cancer     Allergies  Allergen Reactions  . Albuterol Other (See Comments)    Sent into afib  . Augmentin [Amoxicillin-Pot Clavulanate] Rash  . Ciprofloxacin Rash  . Corticosteroids Rash    Patient is unaware of this allergy  . Latex Hives and Rash     OBJECTIVE: Vitals:   03/06/17 1505  BP: 124/79  Pulse: 84  Temp: 98 F (36.7 C)  TempSrc: Oral  Weight: 183 lb (83 kg)   Body mass index is 25.17 kg/m.  Physical Exam  Constitutional: He is oriented to person, place, and time and well-developed, well-nourished, and in no distress.  Eyes: Pupils are equal, round, and reactive to light.  Cardiovascular: Normal rate, regular rhythm, normal heart sounds and intact distal pulses.   No murmur heard. Pulmonary/Chest: Effort normal and breath sounds normal. No respiratory distress. He has no rales.  Abdominal: He exhibits no distension.  Lymphadenopathy:    He has no cervical adenopathy.  Neurological: He is alert and oriented to person, place, and time. He has normal strength.  Skin: Petechiae, purpura and rash noted. No erythema.  Significantly faded and receeding peticheial rash. Necrosed areas with healthy wound bed and no drainage.   Psychiatric: Affect normal.    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 Uncertain if this was indeed his statin therapy (which his dermatologist is in favor of) or truly brought on by other insect-transmitted infection. I favor the latter as he has been on simvistatin for 6 years. I do still wonder about generic vs name brand coumadin and fillers  causing incident. He did change to generic in early July a few short weeks prior to primary eruption. Will continue Prednisone at current dose of 40 mg QD for 4 weeks then taper off over 4 weeks. Discussed that it may take months for lesions to fade completely. I offered to release him today however he would like to come back one more time to follow up when he starts the prednisone taper. Will arrange for follow up in 1 month.          Janene Madeira, MSN, [facility] for [facility] [place] [facility]  03/06/2017  5:34 PM

## 2017-03-06 NOTE — Progress Notes (Deleted)
Patient Name: Matthew Hall  MRN: [id number]  DOB: 1946-01-17    Reason for Visit: ***  Referring Provider: ***  INFECTIOUS DISEASE OFFICE CONSULT SUBJECTIVE:  HPI: Matthew Hall is a 71 y.o. male ***  Patient Active Problem List   Diagnosis Date Noted  . COPD without exacerbation ([place]) 03/01/2017  . Vasculitis ([place]) 02/20/2017  . Atrial fibrillation, chronic ([place]) 07/09/2016  . BRONCHIECTASIS W/O ACUTE EXACERBATION 03/31/2008  . Essential hypertension 09/13/2007  . Allergic rhinitis 09/13/2007  . EMPHYSEMA 09/13/2007  . COPD exacerbation ([place]) 09/13/2007    Patient's Medications  New Prescriptions   No medications on file  Previous Medications   ALBUTEROL (PROVENTIL HFA;VENTOLIN HFA) 108 (90 BASE) MCG/ACT INHALER    Inhale 2 puffs into the lungs every 6 (six) hours as needed for wheezing or shortness of breath.   ALPRAZOLAM (XANAX) 0.25 MG TABLET    Take 0.25 mg by mouth 2 (two) times daily as needed for anxiety or sleep.    BUDESONIDE-FORMOTEROL (SYMBICORT) 160-4.5 MCG/ACT INHALER    Inhale 2 puffs into the lungs 2 (two) times daily.   CETIRIZINE (ZYRTEC) 5 MG TABLET    Take 5 mg by mouth daily.   DOXYCYCLINE (VIBRAMYCIN) 100 MG CAPSULE    Take 1 capsule (100 mg total) by mouth 2 (two) times daily.   HYDROXYPROPYL METHYLCELLULOSE / HYPROMELLOSE (ISOPTO TEARS / GONIOVISC) 2.5 % OPHTHALMIC SOLUTION    Place 1 drop into both eyes as needed for dry eyes.   LEVALBUTEROL (XOPENEX) 0.63 MG/3ML NEBULIZER SOLUTION    Take 3 mLs (0.63 mg total) by nebulization every 6 (six) hours as needed for wheezing or shortness of breath.   LOSARTAN-HYDROCHLOROTHIAZIDE (HYZAAR) 50-12.5 MG TABLET    Take 1 tablet by mouth daily.   METOPROLOL SUCCINATE (TOPROL-XL) 50 MG 24 HR TABLET    Take 1 tablet (50 mg total) by mouth 2 (two) times daily.   OMEPRAZOLE (PRILOSEC) 40 MG CAPSULE    Take 40 mg by mouth daily.   PREDNISONE (DELTASONE) 20 MG TABLET    Take 2 tablets (40 mg total) by mouth  daily with breakfast.   TRIAMCINOLONE (NASACORT) 55 MCG/ACT AERO NASAL INHALER    Place 1 spray into the nose daily as needed (Congestion).    WARFARIN (COUMADIN) 2.5 MG TABLET    Take 2.5 mg by mouth daily.  Modified Medications   No medications on file  Discontinued Medications   No medications on file    ROS    Past Medical History:  Diagnosis Date  . Anxiety   . Atrial fibrillation ([place])   . Bladder cancer ([place])   . Bladder tumor   . Colon polyps   . COPD (chronic obstructive pulmonary disease) ([place])   . Emphysema/COPD ([place])   . Hypertension   . Kidney stone   . Pneumonia   . Skin cancer     Social History  Substance Use Topics  . Smoking status: Former Smoker    Packs/day: 1.00    Years: 30.00    Types: Cigarettes    Quit date: 07/07/2011  . Smokeless tobacco: Never Used  . Alcohol use No    Family History  Problem Relation Age of Onset  . Emphysema Father   . Lung cancer Brother   . Liver cancer Brother   . Diabetes Mother   . Diabetes Sister      Allergies  Allergen Reactions  . Albuterol Other (See Comments)    Sent into  afib  . Augmentin [Amoxicillin-Pot Clavulanate] Rash  . Ciprofloxacin Rash  . Corticosteroids Rash    Patient is unaware of this allergy  . Latex Hives and Rash     OBJECTIVE: Vitals:   03/06/17 1505  BP: 124/79  Pulse: 84  Temp: 98 F (36.7 C)  TempSrc: Oral  Weight: 183 lb (83 kg)   Body mass index is 25.17 kg/m.  Physical Exam   ASSESSMENT & PLAN:  Problem List Items Addressed This Visit    None      Janene Madeira, MSN, [facility] for [facility] [place] [facility]  03/06/2017  3:33 PM

## 2017-03-06 NOTE — Assessment & Plan Note (Addendum)
Resolving on steroid therapy and possible withdrawal of agent. Uncertain if this was indeed his statin therapy (which his dermatologist is in favor of) or truly brought on by other insect-transmitted infection. I favor the latter as he has been on simvistatin for 6 years. I do still wonder about generic vs name brand coumadin and fillers causing incident. He did change to generic in early July a few short weeks prior to primary eruption. Will continue Prednisone at current dose of 40 mg QD for 4 weeks then taper off over 4 weeks. Discussed that it may take months for lesions to fade completely. I offered to release him today however he would like to come back one more time to follow up when he starts the prednisone taper. Will arrange for follow up in 1 month.

## 2017-03-06 NOTE — Patient Instructions (Signed)
Continue taking your prednisone one tablet twice a day until September 14th. Will send in a new prescription and instructions to taper this off.   Can return to see me as needed for now! Your rash looks much better!

## 2017-03-09 ENCOUNTER — Encounter: Payer: Medicare Other | Admitting: Internal Medicine

## 2017-03-22 ENCOUNTER — Other Ambulatory Visit: Payer: Self-pay | Admitting: Infectious Diseases

## 2017-03-22 ENCOUNTER — Encounter: Payer: Self-pay | Admitting: Infectious Diseases

## 2017-03-22 MED ORDER — PREDNISONE 5 MG PO TABS: 5.0000 mg | ORAL_TABLET | Freq: Every day | ORAL | 0 refills | Status: DC

## 2017-03-22 MED ORDER — PREDNISONE 20 MG PO TABS: 20.0000 mg | ORAL_TABLET | Freq: Every day | ORAL | 3 refills | Status: AC

## 2017-04-06 ENCOUNTER — Telehealth: Payer: Self-pay

## 2017-04-06 ENCOUNTER — Ambulatory Visit: Payer: Medicare Other | Admitting: Infectious Diseases

## 2017-04-06 NOTE — Telephone Encounter (Signed)
Per verbal order from [place] I called Matthew Hall to reschedule his missed appt on 9/28. His next appt is on 05/27/17 at 11:15. The pt said the appt was fine with him.  [place]

## 2017-04-13 ENCOUNTER — Encounter: Payer: Self-pay | Admitting: Internal Medicine

## 2017-04-13 ENCOUNTER — Ambulatory Visit (AMBULATORY_SURGERY_CENTER): Payer: Self-pay | Admitting: *Deleted

## 2017-04-13 VITALS — Ht 71.0 in | Wt 187.2 lb

## 2017-04-13 DIAGNOSIS — Z86010 Personal history of colonic polyps: Secondary | ICD-10-CM

## 2017-04-13 NOTE — Progress Notes (Signed)
No allergies to eggs or soy. No problems with anesthesia.  Pt given Emmi instructions for colonoscopy  No oxygen use  No diet drug use  

## 2017-04-27 ENCOUNTER — Telehealth: Payer: Self-pay | Admitting: Internal Medicine

## 2017-04-27 ENCOUNTER — Encounter: Payer: Self-pay | Admitting: Internal Medicine

## 2017-04-27 ENCOUNTER — Ambulatory Visit (AMBULATORY_SURGERY_CENTER): Payer: Medicare Other | Admitting: Internal Medicine

## 2017-04-27 VITALS — BP 141/79 | HR 89 | Temp 97.3°F | Resp 21 | Ht 71.0 in | Wt 187.0 lb

## 2017-04-27 DIAGNOSIS — Z86010 Personal history of colonic polyps: Secondary | ICD-10-CM | POA: Diagnosis not present

## 2017-04-27 MED ORDER — SODIUM CHLORIDE 0.9 % IV SOLN: 500.0000 mL | INTRAVENOUS | Status: DC

## 2017-04-27 NOTE — Telephone Encounter (Signed)
Pt has a Phenergan tablet and asks is he can take it for nausea- ok given to take medication.

## 2017-04-27 NOTE — Op Note (Signed)
[place] Patient Name: Matthew Hall Procedure Date: 04/27/2017 2:00 PM MRN: [id number] Endoscopist: Docia Chuck. Henrene Pastor , MD Age: 71 Referring MD:  Date of Birth: Dec 22, 1945 Gender: Male Account #: [id number] Procedure:                Colonoscopy Indications:              High risk colon cancer surveillance: Personal                            history of non-advanced adenoma. Previous                            examinations elsewhere 2001 (tubular adenoma); 2004                            (negative); 2010(negative) Medicines:                Monitored Anesthesia Care Procedure:                Pre-Anesthesia Assessment:                           - Prior to the procedure, a History and Physical                            was performed, and patient medications and                            allergies were reviewed. The patient's tolerance of                            previous anesthesia was also reviewed. The risks                            and benefits of the procedure and the sedation                            options and risks were discussed with the patient.                            All questions were answered, and informed consent                            was obtained. Prior Anticoagulants: The patient has                            taken Coumadin (warfarin), last dose was 7 days                            prior to procedure. ASA Grade Assessment: III - A                            patient with severe systemic disease. After  reviewing the risks and benefits, the patient was                            deemed in satisfactory condition to undergo the                            procedure.                           After obtaining informed consent, the colonoscope                            was passed under direct vision. Throughout the                            procedure, the patient's blood pressure, pulse, and   oxygen saturations were monitored continuously. The                            Colonoscope was introduced through the anus and                            advanced to the the cecum, identified by                            appendiceal orifice and ileocecal valve. The                            ileocecal valve, appendiceal orifice, and rectum                            were photographed. The quality of the bowel                            preparation was excellent. The colonoscopy was                            performed without difficulty. The patient tolerated                            the procedure well. The bowel preparation used was                            SUPREP. Scope In: 2:11:36 PM Scope Out: 2:21:14 PM Scope Withdrawal Time: 0 hours 8 minutes 40 seconds  Total Procedure Duration: 0 hours 9 minutes 38 seconds  Findings:                 Multiple small and large-mouthed diverticula were                            found in the distal transverse colon and left colon.                           Internal hemorrhoids were found during  retroflexion. The hemorrhoids were moderate.                           The exam was otherwise without abnormality on                            direct and retroflexion views. Complications:            No immediate complications. Estimated blood loss:                            None. Estimated Blood Loss:     Estimated blood loss: none. Impression:               - Diverticulosis in the distal transverse colon and                            in the left colon.                           - Internal hemorrhoids.                           - The examination was otherwise normal on direct                            and retroflexion views.                           - No specimens collected. Recommendation:           - Repeat colonoscopy is not recommended for                            surveillance.                           - Resume  Coumadin (warfarin) today at prior dose.                           - Patient has a contact number available for                            emergencies. The signs and symptoms of potential                            delayed complications were discussed with the                            patient. Return to normal activities tomorrow.                            Written discharge instructions were provided to the                            patient.                           -  Resume previous diet.                           - Continue present medications.                           - Await pathology results. Docia Chuck. Henrene Pastor, MD 04/27/2017 2:25:15 PM This report has been signed electronically.

## 2017-04-27 NOTE — Progress Notes (Signed)
To recovery, report to RN, VSS. 

## 2017-04-27 NOTE — Patient Instructions (Signed)
YOU HAD AN ENDOSCOPIC PROCEDURE TODAY AT [facility]:   Refer to the procedure report that was given to you for any specific questions about what was found during the examination.  If the procedure report does not answer your questions, please call your gastroenterologist to clarify.  If you requested that your care partner not be given the details of your procedure findings, then the procedure report has been included in a sealed envelope for you to review at your convenience later.  YOU SHOULD EXPECT: Some feelings of bloating in the abdomen. Passage of more gas than usual.  Walking can help get rid of the air that was put into your GI tract during the procedure and reduce the bloating. If you had a lower endoscopy (such as a colonoscopy or flexible sigmoidoscopy) you may notice spotting of blood in your stool or on the toilet paper. If you underwent a bowel prep for your procedure, you may not have a normal bowel movement for a few days.  Please Note:  You might notice some irritation and congestion in your nose or some drainage.  This is from the oxygen used during your procedure.  There is no need for concern and it should clear up in a day or so.  SYMPTOMS TO REPORT IMMEDIATELY:   Following lower endoscopy (colonoscopy or flexible sigmoidoscopy):  Excessive amounts of blood in the stool  Significant tenderness or worsening of abdominal pains  Swelling of the abdomen that is new, acute  Fever of 100F or higher  For urgent or emergent issues, a gastroenterologist can be reached at any hour by calling [phone].   DIET:  We do recommend a small meal at first, but then you may proceed to your regular diet.  Drink plenty of fluids but you should avoid alcoholic beverages for 24 hours.  ACTIVITY:  You should plan to take it easy for the rest of today and you should NOT DRIVE or use heavy machinery until tomorrow (because of the sedation medicines used during the test).     FOLLOW UP: Our staff will call the number listed on your records the next business day following your procedure to check on you and address any questions or concerns that you may have regarding the information given to you following your procedure. If we do not reach you, we will leave a message.  However, if you are feeling well and you are not experiencing any problems, there is no need to return our call.  We will assume that you have returned to your regular daily activities without incident.  If any biopsies were taken you will be contacted by phone or by letter within the next 1-3 weeks.  Please call us at [phone] if you have not heard about the biopsies in 3 weeks.   Repeat Colonoscopy is not recommended for surveillance Hemorrhoids (handout given) Diverticulosis (handout given) Resume Coumadin (warfarin) today at prior dose    SIGNATURES/CONFIDENTIALITY: You and/or your care partner have signed paperwork which will be entered into your electronic medical record.  These signatures attest to the fact that that the information above on your After Visit Summary has been reviewed and is understood.  Full responsibility of the confidentiality of this discharge information lies with you and/or your care-partner.

## 2017-04-30 ENCOUNTER — Telehealth: Payer: Self-pay | Admitting: *Deleted

## 2017-04-30 NOTE — Telephone Encounter (Signed)
  Follow up Call-  Call back number 04/27/2017  Post procedure Call Back phone  # [phone]  Permission to leave phone message Yes  Some recent data might be hidden     Patient questions:  Do you have a fever, pain , or abdominal swelling? No. Pain Score  0 *  Have you tolerated food without any problems? Yes.    Have you been able to return to your normal activities? Yes.    Do you have any questions about your discharge instructions: Diet   No. Medications  No. Follow up visit  No.  Do you have questions or concerns about your Care? No.  Actions: * If pain score is 4 or above: No action needed, pain <4.

## 2017-05-09 ENCOUNTER — Telehealth: Payer: Self-pay | Admitting: *Deleted

## 2017-05-09 NOTE — Telephone Encounter (Signed)
Patient called stating he got a call from Dr. Real Cons [facility] telling him that his sed rate is 66. He is no longer on prednisone or antibiotics. His next appt here is 05/21/17. I asked him to have those results faxed here.

## 2017-05-10 NOTE — Telephone Encounter (Signed)
Thank you :)

## 2017-05-21 ENCOUNTER — Ambulatory Visit: Payer: Medicare Other | Admitting: Infectious Diseases

## 2017-05-21 ENCOUNTER — Encounter: Payer: Self-pay | Admitting: Infectious Diseases

## 2017-05-21 DIAGNOSIS — R700 Elevated erythrocyte sedimentation rate: Secondary | ICD-10-CM

## 2017-05-21 DIAGNOSIS — I776 Arteritis, unspecified: Secondary | ICD-10-CM | POA: Diagnosis not present

## 2017-05-21 NOTE — Assessment & Plan Note (Signed)
Previously checked by cardiologist at ~60. He has had several surgeries to his ear/nose/face for skin cancer and likely was d/t this setting. He has no fevers, joint pain, rashes or anything else that would be concerning for infectious cause of this lab value. I do not feel we should keep checking this for now as it does not give anything specific without symptomatology to look into.

## 2017-05-21 NOTE — Progress Notes (Signed)
Patient Name: Matthew Hall  MRN: [id number]  DOB: Nov 03, 1945    Reason for Visit: FU on Rash   Dermatologist - J. Szulecki PCP - Marjean Donna Parker, [place])   SUBJECTIVE:  HPI: Matthew Hall is a 71 y.o. 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 3 day follow up today after resuming Doxycycline and his rash is overall improved and starting to fade / recede. Re-trialed on Doxycycline while blood work was pending. 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 Biopsy revealing lymphocytic vasculitis.   Interval Hx: He has continued to do well and rash has not resolved completely. Parts that were more ulcerated purpura on the LLE have resolved completely and now only scars. Had some lab work done in his cardiologist office recently and reports his Sed Rate was elevated at 50. He has had to have several surgeries recently for his skin cancer 2 spots on his ear and one on his nose that had trouble healing. Denies fevers/chills, abdominal pain, new rashes, joint pain, SOB/CP. No new medications. Continues to take his coumadin for AFib.   Patient Active Problem List   Diagnosis Date Noted  . Elevated sed rate 05/21/2017  . COPD without exacerbation ([place]) 03/01/2017  . 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 Atrial fibrillation, chronic ([place]) 07/09/2016  . BRONCHIECTASIS W/O ACUTE EXACERBATION 03/31/2008  . Essential hypertension 09/13/2007  . Allergic rhinitis 09/13/2007  . EMPHYSEMA 09/13/2007  . COPD exacerbation ([place]) 09/13/2007      Medication List        Accurate as of 05/21/17  5:05 PM. Always use your most recent med list.          albuterol 108 (90 Base) MCG/ACT inhaler Commonly known as:  PROVENTIL HFA;VENTOLIN HFA Inhale 2 puffs into the lungs every 6 (six) hours as needed for wheezing or shortness of breath.   ALPRAZolam 0.25 MG  tablet Commonly known as:  XANAX   budesonide-formoterol 160-4.5 MCG/ACT inhaler Commonly known as:  SYMBICORT Inhale 2 puffs into the lungs 2 (two) times daily.   cetirizine 5 MG tablet Commonly known as:  ZYRTEC   hydroxypropyl methylcellulose / hypromellose 2.5 % ophthalmic solution Commonly known as:  ISOPTO TEARS / GONIOVISC   levalbuterol 0.63 MG/3ML nebulizer solution Commonly known as:  XOPENEX Take 3 mLs (0.63 mg total) by nebulization every 6 (six) hours as needed for wheezing or shortness of breath.   losartan-hydrochlorothiazide 50-12.5 MG tablet Commonly known as:  HYZAAR   metoprolol succinate 50 MG 24 hr tablet Commonly known as:  TOPROL-XL Take 1 tablet (50 mg total) by mouth 2 (two) times daily.   omeprazole 40 MG capsule Commonly known as:  PRILOSEC   predniSONE 5 MG tablet Commonly known as:  DELTASONE Take 1 tablet (5 mg total) by mouth daily with breakfast. Last week dosing of taper.   triamcinolone 55 MCG/ACT Aero nasal inhaler Commonly known as:  NASACORT   warfarin 2.5 MG tablet Commonly known as:  COUMADIN       Review of Systems  Constitutional: Negative for chills, fever and malaise/fatigue.  Eyes: Negative for blurred vision.  Respiratory: Negative for cough and shortness of breath.   Cardiovascular: Negative for chest pain, palpitations, claudication and leg swelling.  Gastrointestinal: Negative for abdominal pain, nausea and vomiting.  Musculoskeletal: Positive for joint pain (feet). Negative for myalgias.  Skin: Positive for rash. Negative  for itching.  Neurological: Negative for dizziness, tingling, sensory change and headaches.  Psychiatric/Behavioral: The patient is not nervous/anxious.     Past Medical History:  Diagnosis Date  . Allergy   . Anxiety   . Atrial fibrillation ([place])   . Bladder cancer ([place])    1996  . Bladder tumor   . Colon polyps   . COPD (chronic obstructive pulmonary disease) ([place])   . Emphysema/COPD  ([place])   . GERD (gastroesophageal reflux disease)   . Hypertension   . Kidney stone   . Pneumonia   . Skin cancer     Allergies  Allergen Reactions  . Simvastatin Rash  . Albuterol Other (See Comments)    Increases heartrate  . Augmentin [Amoxicillin-Pot Clavulanate] Rash  . Ciprofloxacin Rash  . Corticosteroids Rash    Patient is unaware of this allergy  . Latex Hives and Rash     OBJECTIVE: Vitals:   05/21/17 1135  BP: (!) 144/74  Pulse: (!) 102  Temp: 97.7 F (36.5 C)  TempSrc: Oral  Weight: 189 lb (85.7 kg)   Body mass index is 26.36 kg/m.  Physical Exam  Constitutional: He is oriented to person, place, and time and well-developed, well-nourished, and in no distress.  HENT:  Head:    Eyes: Pupils are equal, round, and reactive to light.  Cardiovascular: Normal rate, regular rhythm, normal heart sounds and intact distal pulses.  No murmur heard. Pulmonary/Chest: Effort normal and breath sounds normal. No respiratory distress. He has no rales.  Abdominal: He exhibits no distension.  Lymphadenopathy:    He has no cervical adenopathy.  Neurological: He is alert and oriented to person, place, and time. He has normal strength.  Skin: No petechiae and no rash noted. No erythema.  Psychiatric: Affect normal.  Vitals reviewed.   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 Can return as needed but would also be OK to see local dermatology team.         Other   Elevated sed rate    Previously checked by cardiologist at ~60. He has had several surgeries to his ear/nose/face for skin cancer and likely was d/t this setting. He has no fevers, joint pain, rashes or anything else that would be concerning for infectious cause of this lab value. I do not feel we should keep checking this for now as it does not  give anything specific without symptomatology to look into.         I spent 25 minutes with the patient including greater than 50% of time in face to face counsel of the patient re: vasculitis prognosis/plan, inflammatory markers, other chronic conditions and in coordination of their care.   Janene Madeira, MSN, [facility]  05/21/2017  5:05 PM

## 2017-05-21 NOTE — Patient Instructions (Signed)
Nice to see you are doing so well!   No need to come back and see [place].

## 2017-05-21 NOTE — Assessment & Plan Note (Addendum)
This has completely resolved and has not returned after prednisone taper. Can return as needed but would also be OK to see local dermatology team.

## 2017-06-21 ENCOUNTER — Telehealth: Payer: Self-pay | Admitting: Acute Care

## 2017-06-21 ENCOUNTER — Ambulatory Visit: Payer: Medicare Other | Admitting: Acute Care

## 2017-06-21 ENCOUNTER — Encounter: Payer: Self-pay | Admitting: Acute Care

## 2017-06-21 DIAGNOSIS — J479 Bronchiectasis, uncomplicated: Secondary | ICD-10-CM | POA: Diagnosis not present

## 2017-06-21 DIAGNOSIS — J309 Allergic rhinitis, unspecified: Secondary | ICD-10-CM | POA: Diagnosis not present

## 2017-06-21 DIAGNOSIS — J449 Chronic obstructive pulmonary disease, unspecified: Secondary | ICD-10-CM

## 2017-06-21 MED ORDER — CEFDINIR 300 MG PO CAPS: 300.0000 mg | ORAL_CAPSULE | Freq: Two times a day (BID) | ORAL | 0 refills | Status: AC

## 2017-06-21 MED ORDER — PREDNISONE 10 MG PO TABS: ORAL_TABLET | ORAL | 0 refills | Status: DC

## 2017-06-21 NOTE — Patient Instructions (Addendum)
Omnicef 300 mg twice daily x 10 days Probiotic with antibiotic Prednisone taper; 10 mg tablets: 4 tabs x 2 days, 3 tabs x 2 days, 2 tabs x 2 days 1 tab x 2 days then stop. Continue Nasocort daily for nasal congestion Continue your Symbicort and Spiriva as you are doing. Rinse mouth after use. Continue using your rescue inhaler and Xopenex nebs as needed for breakthrough shortness of breath or wheezing. Follow up with Judson Roch NP or Dr. Elsworth Soho in 2 weeks. Please contact office for sooner follow up if symptoms do not improve or worsen or seek emergency care

## 2017-06-21 NOTE — Telephone Encounter (Signed)
I have sent a message to the pt via mychart to see if he would be able to come in to see SG in 3 weeks on 1-2 in the afternoon.  I advised the pt to call the office to make this appt.

## 2017-06-21 NOTE — Assessment & Plan Note (Signed)
Mild Flare Plan: Omnicef 300 mg twice daily x 10 days Probiotic with antibiotic Prednisone taper; 10 mg tablets: 4 tabs x 2 days, 3 tabs x 2 days, 2 tabs x 2 days 1 tab x 2 days then stop. Continue Nasocort daily for nasal congestion Continue using your rescue inhaler and Xopenex nebs as needed for breakthrough shortness of breath or wheezing. Follow up with Judson Roch NP or Dr. Elsworth Soho in 2 weeks. Please contact office for sooner follow up if symptoms do not improve or worsen or seek emergency care

## 2017-06-21 NOTE — Progress Notes (Signed)
History of Present Illness Matthew Hall is a 71 y.o. male former smoker quit 2012 with COPD and a history of CAP. He is followed by Dr. Elsworth Soho.   06/21/2017 Pt. Presents for an acute OV. He states he has sinus congestion and drainage with  cough with minimal secretions x the last 2 weeks.Secretions are greenish gray looking.He does not have fever. Pt. States he has been self medicating with prednisone 20 mg daily x 2 weeks. He has been Using his wife's medication she takes for RA.  He endorses some ear pain and congestion. He is not using a nasal decongestant or sinus decongestant. .He denies fever, chest pain, orthopnea or hemoptysis.  Test Results:  CBC Latest Ref Rng & Units 02/23/2017 02/19/2017 02/04/2017  WBC 3.8 - 10.8 K/uL 10.5 12.8(H) 12.2(H)  Hemoglobin 13.2 - 17.1 g/dL 14.6 14.5 14.0  Hematocrit 38.5 - 50.0 % 44.2 44.0 41.6  Platelets 140 - 400 K/uL 396 351 311    BMP Latest Ref Rng & Units 02/23/2017 02/19/2017 02/04/2017  Glucose 65 - 99 mg/dL 164(H) 119(H) 175(H)  BUN 7 - 25 mg/dL 16 17 12   Creatinine 0.70 - 1.18 mg/dL 1.03 0.99 0.96  Sodium 135 - 146 mmol/L 138 134(L) 132(L)  Potassium 3.5 - 5.3 mmol/L 3.9 4.1 3.4(L)  Chloride 98 - 110 mmol/L 97(L) 95(L) 98(L)  CO2 20 - 32 mmol/L 21 30 26   Calcium 8.6 - 10.3 mg/dL 9.2 9.3 9.0    BNP    Component Value Date/Time   BNP 83.0 07/09/2016 0517      Past medical hx Past Medical History:  Diagnosis Date  . Allergy   . Anxiety   . Atrial fibrillation ([place])   . Bladder cancer ([place])    1996  . Bladder tumor   . Colon polyps   . COPD (chronic obstructive pulmonary disease) ([place])   . Emphysema/COPD ([place])   . GERD (gastroesophageal reflux disease)   . Hypertension   . Kidney stone   . Pneumonia   . Skin cancer      Social History   Tobacco Use  . Smoking status: Former Smoker    Packs/day: 1.00    Years: 30.00    Pack years: 30.00    Types: Cigarettes    Last attempt to quit: 07/07/2011    Years since  quitting: 5.9  . Smokeless tobacco: Never Used  Substance Use Topics  . Alcohol use: No    Alcohol/week: 0.0 oz  . Drug use: No    Matthew Hall reports that he quit smoking about 5 years ago. His smoking use included cigarettes. He has a 30.00 pack-year smoking history. he has never used smokeless tobacco. He reports that he does not drink alcohol or use drugs.  Tobacco Cessation: Former smoker quit 2012  Past surgical hx, Family hx, Social hx all reviewed.  Current Outpatient Medications on File Prior to Visit  Medication Sig  . albuterol (PROVENTIL HFA;VENTOLIN HFA) 108 (90 BASE) MCG/ACT inhaler Inhale 2 puffs into the lungs every 6 (six) hours as needed for wheezing or shortness of breath.  . ALPRAZolam (XANAX) 0.25 MG tablet Take 0.25 mg by mouth 2 (two) times daily as needed for anxiety or sleep.   . budesonide-formoterol (SYMBICORT) 160-4.5 MCG/ACT inhaler Inhale 2 puffs into the lungs 2 (two) times daily.  . cetirizine (ZYRTEC) 5 MG tablet Take 5 mg by mouth daily.  . hydroxypropyl methylcellulose / hypromellose (ISOPTO TEARS / GONIOVISC) 2.5 % ophthalmic solution Place  1 drop into both eyes as needed for dry eyes.  Marland Kitchen levalbuterol (XOPENEX) 0.63 MG/3ML nebulizer solution Take 3 mLs (0.63 mg total) by nebulization every 6 (six) hours as needed for wheezing or shortness of breath.  . losartan-hydrochlorothiazide (HYZAAR) 50-12.5 MG tablet Take 1 tablet by mouth daily.  . metoprolol succinate (TOPROL-XL) 50 MG 24 hr tablet Take 1 tablet (50 mg total) by mouth 2 (two) times daily.  Marland Kitchen omeprazole (PRILOSEC) 40 MG capsule Take 40 mg by mouth daily.  . predniSONE (DELTASONE) 5 MG tablet Take 1 tablet (5 mg total) by mouth daily with breakfast. Last week dosing of taper. (Patient taking differently: Take 20 mg by mouth daily with breakfast. Last week dosing of taper.)  . triamcinolone (NASACORT) 55 MCG/ACT AERO nasal inhaler Place 1 spray into the nose daily as needed (Congestion).   .  warfarin (COUMADIN) 2.5 MG tablet Take 2.5 mg by mouth daily.   No current facility-administered medications on file prior to visit.      Allergies  Allergen Reactions  . Simvastatin Rash  . Albuterol Other (See Comments)    Increases heartrate  . Augmentin [Amoxicillin-Pot Clavulanate] Rash  . Ciprofloxacin Rash  . Corticosteroids Rash    Patient is unaware of this allergy  . Latex Hives and Rash    Review Of Systems:  Constitutional:   No  weight loss, night sweats,  Fevers, chills, fatigue, or  lassitude.  HEENT:   No headaches,  Difficulty swallowing,  Tooth/dental problems, or  Sore throat,                No sneezing, itching, + ear ache, + nasal congestion, + post nasal drip,   CV:  No chest pain,  Orthopnea, PND, swelling in lower extremities, anasarca, dizziness, palpitations, syncope.   GI  No heartburn, indigestion, abdominal pain, nausea, vomiting, diarrhea, change in bowel habits, loss of appetite, bloody stools.   Resp: No shortness of breath with exertion or at rest.  + excess mucus, + productive cough,  No non-productive cough,  No coughing up of blood.  + change in color of mucus.  No  wheezing.  No chest wall deformity  Skin: no rash or lesions.  GU: no dysuria, change in color of urine, no urgency or frequency.  No flank pain, no hematuria   MS:  No joint pain or swelling.  No decreased range of motion.  No back pain.  Psych:  No change in mood or affect. No depression or anxiety.  No memory loss.   Vital Signs BP 136/64 (BP Location: Left Arm, Cuff Size: Normal)   Pulse 100   Ht 5\' 11"  (1.803 m)   Wt 191 lb 12.8 oz (87 kg)   SpO2 95%   BMI 26.75 kg/m    Physical Exam:  General- No distress,  A&Ox3, pleasant ENT: No sinus tenderness, TM clear, pale nasal mucosa, no oral exudate,+ post nasal drip, no LAN Cardiac: S1, S2, regular rate and rhythm, no murmur Chest: No wheeze/ rales/ dullness; no accessory muscle use, no nasal flaring, no sternal  retractions, diminished per bases Abd.: Soft Non-tender, non-distended Ext: No clubbing cyanosis, edema Neuro:  normal strength Skin: No rashes, warm and dry Psych: normal mood and behavior   Assessment/Plan  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) Stable on current regimen Plan: Continue your Symbicort and Spiriva as you are doing. Rinse mouth after use. Continue using your rescue inhaler and Xopenex nebs as needed for breakthrough shortness of breath or  wheezing. Follow up with Judson Roch NP or Dr. Elsworth Soho in 2 weeks. 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 Continue Nasocort daily for nasal congestion Continue using your rescue inhaler and Xopenex nebs as needed for breakthrough shortness of breath or wheezing. Follow up with Judson Roch NP or Dr. Elsworth Soho in 2 weeks. Please contact office for sooner follow up if symptoms do not improve or worsen or seek emergency care       Magdalen Spatz, NP 06/21/2017  3:10 PM

## 2017-06-21 NOTE — Assessment & Plan Note (Signed)
Stable on current regimen Plan: Continue your Symbicort and Spiriva as you are doing. Rinse mouth after use. Continue using your rescue inhaler and Xopenex nebs as needed for breakthrough shortness of breath or wheezing. Follow up with Judson Roch NP or Dr. Elsworth Soho in 2 weeks. Please contact office for sooner follow up if symptoms do not improve or worsen or seek emergency care

## 2017-06-21 NOTE — Assessment & Plan Note (Signed)
Currently not using Nasocort Plan: Continue NasoCort

## 2017-06-22 NOTE — Telephone Encounter (Signed)
Spoke with pt. He states that he will just call us if he needs to be seen. Nothing further was needed.

## 2017-07-24 ENCOUNTER — Other Ambulatory Visit: Payer: Self-pay | Admitting: *Deleted

## 2017-07-24 NOTE — Telephone Encounter (Signed)
Per S. Dixon, NP, the patient needs to see his PCP if there is a new problem requiring prednisone.  Message forwarded to [facility] in [place], [place].

## 2017-07-27 ENCOUNTER — Emergency Department (HOSPITAL_COMMUNITY): Payer: Medicare Other

## 2017-07-27 ENCOUNTER — Other Ambulatory Visit: Payer: Self-pay

## 2017-07-27 ENCOUNTER — Encounter (HOSPITAL_COMMUNITY): Payer: Self-pay | Admitting: Emergency Medicine

## 2017-07-27 ENCOUNTER — Emergency Department (HOSPITAL_COMMUNITY)
Admission: EM | Admit: 2017-07-27 | Discharge: 2017-07-27 | Disposition: A | Discharge status: Home / Self Care | Payer: Medicare Other | Attending: Emergency Medicine | Admitting: Emergency Medicine

## 2017-07-27 DIAGNOSIS — K5732 Diverticulitis of large intestine without perforation or abscess without bleeding: Secondary | ICD-10-CM | POA: Diagnosis not present

## 2017-07-27 DIAGNOSIS — K5792 Diverticulitis of intestine, part unspecified, without perforation or abscess without bleeding: Secondary | ICD-10-CM

## 2017-07-27 DIAGNOSIS — Z8719 Personal history of other diseases of the digestive system: Secondary | ICD-10-CM | POA: Diagnosis not present

## 2017-07-27 DIAGNOSIS — J449 Chronic obstructive pulmonary disease, unspecified: Secondary | ICD-10-CM | POA: Insufficient documentation

## 2017-07-27 DIAGNOSIS — I10 Essential (primary) hypertension: Secondary | ICD-10-CM | POA: Insufficient documentation

## 2017-07-27 DIAGNOSIS — R1032 Left lower quadrant pain: Secondary | ICD-10-CM | POA: Diagnosis present

## 2017-07-27 DIAGNOSIS — Z79899 Other long term (current) drug therapy: Secondary | ICD-10-CM | POA: Insufficient documentation

## 2017-07-27 DIAGNOSIS — Z8551 Personal history of malignant neoplasm of bladder: Secondary | ICD-10-CM | POA: Insufficient documentation

## 2017-07-27 DIAGNOSIS — Z91040 Latex allergy status: Secondary | ICD-10-CM | POA: Insufficient documentation

## 2017-07-27 DIAGNOSIS — Z7901 Long term (current) use of anticoagulants: Secondary | ICD-10-CM | POA: Insufficient documentation

## 2017-07-27 LAB — CBC
HCT: 44.3 % (ref 39.0–52.0)
Hemoglobin: 13.8 g/dL (ref 13.0–17.0)
MCH: 29.7 pg (ref 26.0–34.0)
MCHC: 31.2 g/dL (ref 30.0–36.0)
MCV: 95.5 fL (ref 78.0–100.0)
Platelets: 386 10*3/uL (ref 150–400)
RBC: 4.64 MIL/uL (ref 4.22–5.81)
RDW: 13.6 % (ref 11.5–15.5)
WBC: 11.6 10*3/uL — ABNORMAL HIGH (ref 4.0–10.5)

## 2017-07-27 LAB — LIPASE, BLOOD: Lipase: 26 U/L (ref 11–51)

## 2017-07-27 LAB — COMPREHENSIVE METABOLIC PANEL
ALT: 28 U/L (ref 17–63)
AST: 20 U/L (ref 15–41)
Albumin: 3.7 g/dL (ref 3.5–5.0)
Alkaline Phosphatase: 61 U/L (ref 38–126)
Anion gap: 13 (ref 5–15)
BUN: 17 mg/dL (ref 6–20)
CO2: 30 mmol/L (ref 22–32)
Calcium: 9.3 mg/dL (ref 8.9–10.3)
Chloride: 98 mmol/L — ABNORMAL LOW (ref 101–111)
Creatinine, Ser: 1.01 mg/dL (ref 0.61–1.24)
GFR calc Af Amer: >60 mL/min (ref >60)
GFR calc non Af Amer: >60 mL/min (ref >60)
Glucose, Bld: 103 mg/dL — ABNORMAL HIGH (ref 65–99)
Potassium: 3.5 mmol/L (ref 3.5–5.1)
Sodium: 141 mmol/L (ref 135–145)
Total Bilirubin: 1 mg/dL (ref 0.3–1.2)
Total Protein: 8.1 g/dL (ref 6.5–8.1)

## 2017-07-27 LAB — URINALYSIS, ROUTINE W REFLEX MICROSCOPIC
Bilirubin Urine: NEGATIVE
Glucose, UA: NEGATIVE mg/dL
Hgb urine dipstick: NEGATIVE
Ketones, ur: NEGATIVE mg/dL
Leukocytes, UA: NEGATIVE
Nitrite: NEGATIVE
Protein, ur: NEGATIVE mg/dL
Specific Gravity, Urine: 1.015 (ref 1.005–1.030)
pH: 6 (ref 5.0–8.0)

## 2017-07-27 LAB — PROTIME-INR
INR: 2.2
Prothrombin Time: 24.2 seconds — ABNORMAL HIGH (ref 11.4–15.2)

## 2017-07-27 MED ORDER — METRONIDAZOLE 500 MG PO TABS: 500.0000 mg | ORAL_TABLET | Freq: Three times a day (TID) | ORAL | 0 refills | Status: DC

## 2017-07-27 MED ORDER — METRONIDAZOLE 500 MG PO TABS
500.0000 mg | ORAL_TABLET | Freq: Once | ORAL | Status: AC
  Administered 2017-07-27: 500 mg via ORAL
  Filled 2017-07-27: qty 1

## 2017-07-27 MED ORDER — SULFAMETHOXAZOLE-TRIMETHOPRIM 800-160 MG PO TABS
1.0000 | ORAL_TABLET | Freq: Once | ORAL | Status: AC
  Administered 2017-07-27: 1 via ORAL
  Filled 2017-07-27: qty 1

## 2017-07-27 MED ORDER — HYDROCODONE-ACETAMINOPHEN 5-325 MG PO TABS: ORAL_TABLET | ORAL | 0 refills | Status: DC

## 2017-07-27 MED ORDER — SULFAMETHOXAZOLE-TRIMETHOPRIM 800-160 MG PO TABS: 1.0000 | ORAL_TABLET | Freq: Two times a day (BID) | ORAL | 0 refills | Status: DC

## 2017-07-27 NOTE — ED Triage Notes (Signed)
Pt reports pain in his L abdomen that started 2 days ago, close to his beltline. Patient states the pain started when he turned over in bed, has been constant since. No n/v/d. Last BM this am.

## 2017-07-27 NOTE — ED Provider Notes (Signed)
[facility] EMERGENCY DEPARTMENT Provider Note   CSN: [id number] Arrival date & time: 07/27/17  1202     History   Chief Complaint Chief Complaint  Patient presents with  . Abdominal Pain    HPI Matthew Hall is a 72 y.o. male.  HPI  Pt was seen at 1555.  Per pt, c/o gradual onset and persistence of constant left lower abd "pain" for the past 3 days.  Has been associated with no other symptoms.  Describes the abd pain as "cramping." States the pain started when he "rolled over in bed."  Denies N/V, no diarrhea, no fevers, no back pain, no rash, no CP/SOB, no black or blood in stools, no dysuria/hematuria, no testicular pain/swelling.      Past Medical History:  Diagnosis Date  . Allergy   . Anxiety   . Atrial fibrillation ([place])   . Bladder cancer ([place])    1996  . Bladder tumor   . Colon polyps   . COPD (chronic obstructive pulmonary disease) ([place])   . Emphysema/COPD ([place])   . GERD (gastroesophageal reflux disease)   . Hypertension   . Kidney stone   . Pneumonia   . Skin cancer     Patient Active Problem List   Diagnosis Date Noted  . Elevated sed rate 05/21/2017  . COPD without exacerbation ([place]) 03/01/2017  . 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 Atrial fibrillation, chronic ([place]) 07/09/2016  . BRONCHIECTASIS W/O ACUTE EXACERBATION 03/31/2008  . Essential hypertension 09/13/2007  . Allergic rhinitis 09/13/2007  . EMPHYSEMA 09/13/2007  . COPD exacerbation ([place]) 09/13/2007    Past Surgical History:  Procedure Laterality Date  . bladder tumor removed  1996  . COLONOSCOPY    . exc skin cancee Left    2003  . Kidney stones removed  1996       Home Medications    Prior to Admission medications   Medication Sig Start Date End Date Taking? Authorizing Provider  albuterol (PROVENTIL HFA;VENTOLIN HFA) 108 (90 BASE) MCG/ACT inhaler Inhale 2 puffs into the lungs every 6 (six) hours as needed for wheezing or shortness of breath. 08/03/14   Rigoberto Noel, MD  ALPRAZolam Duanne Moron) 0.25 MG tablet Take 0.25 mg by mouth 2 (two) times daily as needed for anxiety or sleep.  07/01/14   [provider]  budesonide-formoterol (SYMBICORT) 160-4.5 MCG/ACT inhaler Inhale 2 puffs into the lungs 2 (two) times daily. 12/01/14   Rigoberto Noel, MD  cetirizine (ZYRTEC) 5 MG tablet Take 5 mg by mouth daily.    [provider]  hydroxypropyl methylcellulose / hypromellose (ISOPTO TEARS / GONIOVISC) 2.5 % ophthalmic solution Place 1 drop into both eyes as needed for dry eyes.    [provider]  levalbuterol Penne Lash) 0.63 MG/3ML nebulizer solution Take 3 mLs (0.63 mg total) by nebulization every 6 (six) hours as needed for wheezing or shortness of breath. 10/19/16   Rigoberto Noel, MD  losartan-hydrochlorothiazide (HYZAAR) 50-12.5 MG tablet Take 1 tablet by mouth daily.    [provider]  metoprolol succinate (TOPROL-XL) 50 MG 24 hr tablet Take 1 tablet (50 mg total) by mouth 2 (two) times daily. 07/11/16   Thurnell Lose, MD  omeprazole (PRILOSEC) 40 MG capsule Take 40 mg by mouth daily.    [provider]  predniSONE (DELTASONE) 10 MG tablet 4tabs x 2 days, 3tabs x2 days, 2 tabs x2 days, 1 tab x 2 days then stop 06/21/17   Eric Form  F, NP  predniSONE (DELTASONE) 5 MG tablet Take 1 tablet (5 mg total) by mouth daily with breakfast. Last week dosing of taper. Patient taking differently: Take 20 mg by mouth daily with breakfast. Last week dosing of taper. 04/16/17   Catharine Callas, NP  triamcinolone (NASACORT) 55 MCG/ACT AERO nasal inhaler Place 1 spray into the nose daily as needed (Congestion).     [provider]  warfarin (COUMADIN) 2.5 MG tablet Take 2.5 mg by mouth daily.    [provider]    Family History Family History  Problem Relation Age of Onset  . Emphysema Father   . Lung cancer Brother   . Liver cancer Brother   . Diabetes Mother   . Diabetes Sister   . Colon cancer Neg  Hx     Social History Social History   Tobacco Use  . Smoking status: Former Smoker    Packs/day: 1.00    Years: 30.00    Pack years: 30.00    Types: Cigarettes    Last attempt to quit: 07/07/2011    Years since quitting: 6.0  . Smokeless tobacco: Never Used  Substance Use Topics  . Alcohol use: No    Alcohol/week: 0.0 oz  . Drug use: No     Allergies   Simvastatin; Albuterol; Augmentin [amoxicillin-pot clavulanate]; Ciprofloxacin; Corticosteroids; and Latex   Review of Systems Review of Systems ROS: Statement: All systems negative except as marked or noted in the HPI; Constitutional: Negative for fever and chills. ; ; Eyes: Negative for eye pain, redness and discharge. ; ; ENMT: Negative for ear pain, hoarseness, nasal congestion, sinus pressure and sore throat. ; ; Cardiovascular: Negative for chest pain, palpitations, diaphoresis, dyspnea and peripheral edema. ; ; Respiratory: Negative for cough, wheezing and stridor. ; ; Gastrointestinal: +abd pain. Negative for nausea, vomiting, diarrhea, blood in stool, hematemesis, jaundice and rectal bleeding. . ; ; Genitourinary: Negative for dysuria, flank pain and hematuria. ; ; Genital:  No penile drainage or rash, no testicular pain or swelling, no scrotal rash or swelling. ;; Musculoskeletal: Negative for back pain and neck pain. Negative for swelling and trauma.; ; Skin: Negative for pruritus, rash, abrasions, blisters, bruising and skin lesion.; ; Neuro: Negative for headache, lightheadedness and neck stiffness. Negative for weakness, altered level of consciousness, altered mental status, extremity weakness, paresthesias, involuntary movement, seizure and syncope.       Physical Exam Updated Vital Signs BP (!) 137/92 (BP Location: Right Arm)   Pulse 84   Temp 97.7 F (36.5 C)   Resp 18   Ht 5' 11.5" (1.816 m)   Wt 86.2 kg (190 lb)   SpO2 96%   BMI 26.13 kg/m   Physical Exam 1600: Physical examination:  Nursing notes  reviewed; Vital signs and O2 SAT reviewed;  Constitutional: Well developed, Well nourished, Well hydrated, In no acute distress; Head:  Normocephalic, atraumatic; Eyes: EOMI, PERRL, No scleral icterus; ENMT: Mouth and pharynx normal, Mucous membranes moist; Neck: Supple, Full range of motion, No lymphadenopathy; Cardiovascular: Regular rate and rhythm, No gallop; Respiratory: Breath sounds clear & equal bilaterally, No wheezes.  Speaking full sentences with ease, Normal respiratory effort/excursion; Chest: Nontender, Movement normal; Abdomen: Soft, +LLQ tenderness to palp. No rebound or guarding. Nondistended, Normal bowel sounds; Genitourinary: No CVA tenderness; Extremities: Pulses normal, No tenderness, No edema, No calf edema or asymmetry.; Neuro: AA&Ox3, Major CN grossly intact.  Speech clear. No gross focal motor or sensory deficits in extremities. Climbs on and  off stretcher easily by himself. Gait steady..; Skin: Color normal, Warm, Dry.   ED Treatments / Results  Labs (all labs ordered are listed, but only abnormal results are displayed)   EKG  EKG Interpretation None       Radiology   Procedures Procedures (including critical care time)  Medications Ordered in ED Medications - No data to display   Initial Impression / Assessment and Plan / ED Course  I have reviewed the triage vital signs and the nursing notes.  Pertinent labs & imaging results that were available during my care of the patient were reviewed by me and considered in my medical decision making (see chart for details).  MDM Reviewed: previous chart, nursing note and vitals Reviewed previous: labs Interpretation: labs and CT scan   Results for orders placed or performed during the hospital encounter of 07/27/17  Lipase, blood  Result Value Ref Range   Lipase 26 11 - 51 U/L  Comprehensive metabolic panel  Result Value Ref Range   Sodium 141 135 - 145 mmol/L   Potassium 3.5 3.5 - 5.1 mmol/L   Chloride  98 (L) 101 - 111 mmol/L   CO2 30 22 - 32 mmol/L   Glucose, Bld 103 (H) 65 - 99 mg/dL   BUN 17 6 - 20 mg/dL   Creatinine, Ser 1.01 0.61 - 1.24 mg/dL   Calcium 9.3 8.9 - 10.3 mg/dL   Total Protein 8.1 6.5 - 8.1 g/dL   Albumin 3.7 3.5 - 5.0 g/dL   AST 20 15 - 41 U/L   ALT 28 17 - 63 U/L   Alkaline Phosphatase 61 38 - 126 U/L   Total Bilirubin 1.0 0.3 - 1.2 mg/dL   GFR calc non Af Amer >60 >60 mL/min   GFR calc Af Amer >60 >60 mL/min   Anion gap 13 5 - 15  CBC  Result Value Ref Range   WBC 11.6 (H) 4.0 - 10.5 K/uL   RBC 4.64 4.22 - 5.81 MIL/uL   Hemoglobin 13.8 13.0 - 17.0 g/dL   HCT 44.3 39.0 - 52.0 %   MCV 95.5 78.0 - 100.0 fL   MCH 29.7 26.0 - 34.0 pg   MCHC 31.2 30.0 - 36.0 g/dL   RDW 13.6 11.5 - 15.5 %   Platelets 386 150 - 400 K/uL  Urinalysis, Routine w reflex microscopic  Result Value Ref Range   Color, Urine YELLOW YELLOW   APPearance CLEAR CLEAR   Specific Gravity, Urine 1.015 1.005 - 1.030   pH 6.0 5.0 - 8.0   Glucose, UA NEGATIVE NEGATIVE mg/dL   Hgb urine dipstick NEGATIVE NEGATIVE   Bilirubin Urine NEGATIVE NEGATIVE   Ketones, ur NEGATIVE NEGATIVE mg/dL   Protein, ur NEGATIVE NEGATIVE mg/dL   Nitrite NEGATIVE NEGATIVE   Leukocytes, UA NEGATIVE NEGATIVE  Protime-INR  Result Value Ref Range   Prothrombin Time 24.2 (H) 11.4 - 15.2 seconds   INR 2.20    Ct Renal Stone Study Result Date: 07/27/2017 CLINICAL DATA:  Left abdominal pain for 2 days. EXAM: CT ABDOMEN AND PELVIS WITHOUT CONTRAST TECHNIQUE: Multidetector CT imaging of the abdomen and pelvis was performed following the standard protocol without IV contrast. COMPARISON:  None. FINDINGS: Lower chest: Bronchiectasis, peribronchial thickening in the right lower lobe. Airspace disease versus atelectasis in the right middle lobe, partially visualized. Probable atelectasis in the lingula. Hepatobiliary: Incompletely characterized 1.2 cm right hepatic hypoattenuated lesion. Normal gallbladder. Pancreas:  Unremarkable. No pancreatic ductal dilatation or surrounding inflammatory changes.  Spleen: Normal in size without focal abnormality. Adrenals/Urinary Tract: Adrenal glands are unremarkable. Kidneys are normal, without renal calculi, focal lesion, or hydronephrosis. Nonspecific bilateral perinephric stranding. Bladder is unremarkable. Stomach/Bowel: Normal appearance of the stomach and small bowel. Normal appendix. Extensive left colonic diverticulosis. Long segment of circumferential mucosal thickening of the sigmoid colon. Focal area of pericolonic inflammatory changes at the junction of distal descending colon/proximal sigmoid colon with focal asymmetric mucosal thickening of the bowel, image 53/86, sequence 2, axial images and image 40/98, sequence 5, coronal images. Vascular/Lymphatic: Aortic atherosclerosis. No enlarged abdominal or pelvic lymph nodes. Reproductive: Prostate is unremarkable. Other: Bilateral fat containing inguinal hernias and small fat containing periumbilical anterior abdominal wall hernia. Musculoskeletal: No acute or significant osseous findings. IMPRESSION: Focal asymmetric mucosal thickening at the junction of the descending colon/proximal sigmoid colon with pericolonic inflammatory changes, on the background of extensive diverticulosis of the left colon and long segment of circumferential mucosal thickening of the sigmoid colon. These findings may represent acute on chronic diverticulitis. However primary colonic malignancy cannot be excluded. Correlation to colonoscopy, after resolution of the acute symptomatology may be considered. No evidence of colonic rupture or abscess formation. 12 mm incompletely characterized due to lack of IV contrast lesion within the right lobe of the liver. Further evaluation with contrast-enhanced CT or MRI of the abdomen may be considered. Right lower lobe bronchiectasis and peribronchial thickening, likely due to chronic bronchitis. Incompletely  visualized atelectasis versus airspace consolidation in the right middle lobe. Electronically Signed   By: Fidela Salisbury M.D.   On: 07/27/2017 17:00    1855:  CT as above. Pt states he "just had" a colonoscopy and was told he had diverticulosis. I offered admission; pt declines and wants to go home. Pt makes his own medical decisions. Return precautions given. Pt with multiple drug allergies: will flagyl and bactrim while in the ED and then rx. Dx and testing d/w pt and family.  Questions answered.  Verb understanding, agreeable to d/c home with outpt f/u.    Final Clinical Impressions(s) / ED Diagnoses   Final diagnoses:  None    ED Discharge Orders    None       Francine Graven, DO 07/29/17 1351

## 2017-07-27 NOTE — Discharge Instructions (Signed)
Take the prescriptions as directed.  Have liquid diet for the next few days, then eat a bland diet; advance to your regular diet slowly as you can tolerate it. Your CT scan showed an incidental finding: "12 mm incompletely characterized due to lack of IV contrast lesion within the right lobe of the liver. Further evaluation with contrast-enhanced CT or MRI of the abdomen may be considered." Your regular medical doctor or your GI doctor can follow up this finding. Your coumadin level may be affected by the antibiotics.  Call your regular medical doctor on Monday to schedule a follow up appointment this week and to also have your INR re-checked. Call your GI doctor on Monday to schedule a follow up appointment this week.  Return to the Emergency Department immediately sooner if worsening.

## 2017-07-27 NOTE — ED Notes (Signed)
Patient reports prior history of kidney stone. States he had L groin pain a week ago but it resolved. Denies burning, frequency, or blood in his urine.

## 2017-07-27 NOTE — ED Notes (Signed)
Checked on patient in waiting area. NAD. Delay explained to patient and family.

## 2017-08-02 ENCOUNTER — Other Ambulatory Visit: Payer: Self-pay

## 2017-08-02 ENCOUNTER — Ambulatory Visit: Payer: Medicare Other | Admitting: Gastroenterology

## 2017-08-02 ENCOUNTER — Encounter (HOSPITAL_COMMUNITY): Payer: Self-pay | Admitting: Emergency Medicine

## 2017-08-02 ENCOUNTER — Emergency Department (HOSPITAL_COMMUNITY)
Admission: EM | Admit: 2017-08-02 | Discharge: 2017-08-02 | Disposition: A | Discharge status: Home / Self Care | Payer: Medicare Other | Attending: Emergency Medicine | Admitting: Emergency Medicine

## 2017-08-02 ENCOUNTER — Emergency Department (HOSPITAL_COMMUNITY): Payer: Medicare Other

## 2017-08-02 DIAGNOSIS — Z91040 Latex allergy status: Secondary | ICD-10-CM | POA: Diagnosis not present

## 2017-08-02 DIAGNOSIS — R21 Rash and other nonspecific skin eruption: Secondary | ICD-10-CM | POA: Diagnosis present

## 2017-08-02 DIAGNOSIS — Z79899 Other long term (current) drug therapy: Secondary | ICD-10-CM | POA: Diagnosis not present

## 2017-08-02 DIAGNOSIS — I776 Arteritis, unspecified: Secondary | ICD-10-CM

## 2017-08-02 DIAGNOSIS — R109 Unspecified abdominal pain: Secondary | ICD-10-CM | POA: Diagnosis not present

## 2017-08-02 DIAGNOSIS — Z7901 Long term (current) use of anticoagulants: Secondary | ICD-10-CM | POA: Diagnosis not present

## 2017-08-02 DIAGNOSIS — I10 Essential (primary) hypertension: Secondary | ICD-10-CM | POA: Diagnosis not present

## 2017-08-02 DIAGNOSIS — R531 Weakness: Secondary | ICD-10-CM | POA: Diagnosis not present

## 2017-08-02 DIAGNOSIS — Z87891 Personal history of nicotine dependence: Secondary | ICD-10-CM | POA: Diagnosis not present

## 2017-08-02 DIAGNOSIS — J449 Chronic obstructive pulmonary disease, unspecified: Secondary | ICD-10-CM | POA: Insufficient documentation

## 2017-08-02 DIAGNOSIS — L958 Other vasculitis limited to the skin: Secondary | ICD-10-CM | POA: Insufficient documentation

## 2017-08-02 LAB — CBC WITH DIFFERENTIAL/PLATELET
Basophils Absolute: 0 10*3/uL (ref 0.0–0.1)
Basophils Relative: 0 %
Eosinophils Absolute: 0.1 10*3/uL (ref 0.0–0.7)
Eosinophils Relative: 1 %
HCT: 42.4 % (ref 39.0–52.0)
Hemoglobin: 13.8 g/dL (ref 13.0–17.0)
Lymphocytes Relative: 10 %
Lymphs Abs: 1.2 10*3/uL (ref 0.7–4.0)
MCH: 29.6 pg (ref 26.0–34.0)
MCHC: 32.5 g/dL (ref 30.0–36.0)
MCV: 91 fL (ref 78.0–100.0)
Monocytes Absolute: 1.5 10*3/uL — ABNORMAL HIGH (ref 0.1–1.0)
Monocytes Relative: 13 %
Neutro Abs: 9 10*3/uL — ABNORMAL HIGH (ref 1.7–7.7)
Neutrophils Relative %: 76 %
Platelets: 355 10*3/uL (ref 150–400)
RBC: 4.66 MIL/uL (ref 4.22–5.81)
RDW: 13.6 % (ref 11.5–15.5)
WBC: 11.8 10*3/uL — ABNORMAL HIGH (ref 4.0–10.5)

## 2017-08-02 LAB — COMPREHENSIVE METABOLIC PANEL
ALT: 46 U/L (ref 17–63)
AST: 41 U/L (ref 15–41)
Albumin: 3.6 g/dL (ref 3.5–5.0)
Alkaline Phosphatase: 52 U/L (ref 38–126)
Anion gap: 9 (ref 5–15)
BUN: 14 mg/dL (ref 6–20)
CO2: 24 mmol/L (ref 22–32)
Calcium: 9.2 mg/dL (ref 8.9–10.3)
Chloride: 97 mmol/L — ABNORMAL LOW (ref 101–111)
Creatinine, Ser: 1.14 mg/dL (ref 0.61–1.24)
GFR calc Af Amer: >60 mL/min (ref >60)
GFR calc non Af Amer: >60 mL/min (ref >60)
Glucose, Bld: 119 mg/dL — ABNORMAL HIGH (ref 65–99)
Potassium: 4.2 mmol/L (ref 3.5–5.1)
Sodium: 130 mmol/L — ABNORMAL LOW (ref 135–145)
Total Bilirubin: 0.5 mg/dL (ref 0.3–1.2)
Total Protein: 7.5 g/dL (ref 6.5–8.1)

## 2017-08-02 LAB — URINALYSIS, ROUTINE W REFLEX MICROSCOPIC
Bacteria, UA: NONE SEEN
Bilirubin Urine: NEGATIVE
Glucose, UA: NEGATIVE mg/dL
Hgb urine dipstick: NEGATIVE
Ketones, ur: NEGATIVE mg/dL
Nitrite: NEGATIVE
Protein, ur: NEGATIVE mg/dL
Specific Gravity, Urine: 1.011 (ref 1.005–1.030)
Squamous Epithelial / LPF: NONE SEEN
pH: 6 (ref 5.0–8.0)

## 2017-08-02 LAB — PROTIME-INR
INR: 3.03
Prothrombin Time: 31.1 seconds — ABNORMAL HIGH (ref 11.4–15.2)

## 2017-08-02 MED ORDER — PREDNISONE 10 MG PO TABS: ORAL_TABLET | ORAL | 0 refills | Status: DC

## 2017-08-02 MED ORDER — IOPAMIDOL (ISOVUE-300) INJECTION 61%
100.0000 mL | Freq: Once | INTRAVENOUS | Status: AC | PRN
  Administered 2017-08-02: 100 mL via INTRAVENOUS

## 2017-08-02 NOTE — ED Provider Notes (Signed)
[facility] EMERGENCY DEPARTMENT Provider Note   CSN: [id number] Arrival date & time: 08/02/17  0847     History   Chief Complaint Chief Complaint  Patient presents with  . Rash  . Weakness    HPI Matthew Hall is a 71 y.o. male.  HPI 6 days ago and diagnosed with diverticulitis.  Started on Flagyl and Bactrim.  Patient states that his abdominal pain has improved.  He has had some generalized weakness and nausea.  No fever or chills.  Yesterday noticed a rash to his bilateral lower extremities which has progressed now involving his left upper extremity.  States the rash is similar in appearance to when he was diagnosed with vasculitis over the summer due to a medication. Past Medical History:  Diagnosis Date  . Allergy   . Anxiety   . Atrial fibrillation ([place])   . Bladder cancer ([place])    1996  . Bladder tumor   . Colon polyps   . COPD (chronic obstructive pulmonary disease) ([place])   . Emphysema/COPD ([place])   . GERD (gastroesophageal reflux disease)   . Hypertension   . Kidney stone   . Pneumonia   . Skin cancer     Patient Active Problem List   Diagnosis Date Noted  . Elevated sed rate 05/21/2017  . COPD without exacerbation ([place]) 03/01/2017  . 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 Atrial fibrillation, chronic ([place]) 07/09/2016  . BRONCHIECTASIS W/O ACUTE EXACERBATION 03/31/2008  . Essential hypertension 09/13/2007  . Allergic rhinitis 09/13/2007  . EMPHYSEMA 09/13/2007  . COPD exacerbation ([place]) 09/13/2007    Past Surgical History:  Procedure Laterality Date  . bladder tumor removed  1996  . COLONOSCOPY    . exc skin cancee Left    2003  . Kidney stones removed  1996       Home Medications    Prior to Admission medications   Medication Sig Start Date End Date Taking? Authorizing Provider  albuterol (PROVENTIL HFA;VENTOLIN HFA) 108 (90 BASE) MCG/ACT inhaler Inhale 2 puffs into the lungs every 6 (six) hours as needed for wheezing or shortness  of breath. Patient not taking: Reported on 07/27/2017 08/03/14   Rigoberto Noel, MD  ALPRAZolam Duanne Moron) 0.25 MG tablet Take 0.25 mg by mouth 2 (two) times daily as needed for anxiety or sleep.  07/01/14   [provider]  budesonide-formoterol (SYMBICORT) 160-4.5 MCG/ACT inhaler Inhale 2 puffs into the lungs 2 (two) times daily. 12/01/14   Rigoberto Noel, MD  cetirizine (ZYRTEC) 5 MG tablet Take 5 mg by mouth daily.    [provider]  HYDROcodone-acetaminophen (NORCO/VICODIN) 5-325 MG tablet 1 tab PO q12 hours prn pain 07/27/17   Francine Graven, DO  hydroxypropyl methylcellulose / hypromellose (ISOPTO TEARS / GONIOVISC) 2.5 % ophthalmic solution Place 1 drop into both eyes as needed for dry eyes.    [provider]  levalbuterol Penne Lash) 0.63 MG/3ML nebulizer solution Take 3 mLs (0.63 mg total) by nebulization every 6 (six) hours as needed for wheezing or shortness of breath. 10/19/16   Rigoberto Noel, MD  losartan-hydrochlorothiazide (HYZAAR) 50-12.5 MG tablet Take 1 tablet by mouth daily.    [provider]  metoprolol succinate (TOPROL-XL) 50 MG 24 hr tablet Take 1 tablet (50 mg total) by mouth 2 (two) times daily. 07/11/16   Thurnell Lose, MD  Multiple Vitamin (MULTIVITAMIN WITH MINERALS) TABS tablet Take 1 tablet by mouth daily.    [provider]  omeprazole ([place])  40 MG capsule Take 40 mg by mouth daily.    [provider]  predniSONE (DELTASONE) 10 MG tablet 4tabs x 2 days, 3tabs x2 days, 2 tabs x2 days, 1 tab x 2 days then stop 08/02/17   Julianne Rice, MD  triamcinolone (NASACORT) 55 MCG/ACT AERO nasal inhaler Place 1 spray into the nose daily as needed (Congestion).     [provider]  warfarin (COUMADIN) 2.5 MG tablet Take 2.5 mg by mouth daily.    [provider]    Family History Family History  Problem Relation Age of Onset  . Emphysema Father   . Lung cancer Brother   . Liver cancer Brother   .  Diabetes Mother   . Diabetes Sister   . Colon cancer Neg Hx     Social History Social History   Tobacco Use  . Smoking status: Former Smoker    Packs/day: 1.00    Years: 30.00    Pack years: 30.00    Types: Cigarettes    Last attempt to quit: 07/07/2011    Years since quitting: 6.0  . Smokeless tobacco: Never Used  Substance Use Topics  . Alcohol use: No    Alcohol/week: 0.0 oz  . Drug use: No     Allergies   Simvastatin; Albuterol; Augmentin [amoxicillin-pot clavulanate]; Ciprofloxacin; Corticosteroids; and Latex   Review of Systems Review of Systems  Constitutional: Positive for fatigue. Negative for chills and fever.  HENT: Negative for facial swelling and trouble swallowing.   Respiratory: Negative for cough and shortness of breath.   Cardiovascular: Negative for chest pain, palpitations and leg swelling.  Gastrointestinal: Positive for nausea and vomiting. Negative for abdominal pain, blood in stool, constipation and diarrhea.  Genitourinary: Negative for dysuria, flank pain, frequency and hematuria.  Musculoskeletal: Negative for back pain, myalgias, neck pain and neck stiffness.  Skin: Positive for rash. Negative for wound.  Neurological: Positive for weakness. Negative for dizziness, syncope, light-headedness, numbness and headaches.  All other systems reviewed and are negative.    Physical Exam Updated Vital Signs BP 112/73   Pulse 67   Temp 97.6 F (36.4 C) (Oral)   Resp 18   Ht 5' 11.5" (1.816 m)   Wt 86.2 kg (190 lb)   SpO2 95%   BMI 26.13 kg/m   Physical Exam  Constitutional: He is oriented to person, place, and time. He appears well-developed and well-nourished. No distress.  HENT:  Head: Normocephalic and atraumatic.  Mouth/Throat: Oropharynx is clear and moist. No oropharyngeal exudate.  No intraoral lesions identified.  Eyes: EOM are normal. Pupils are equal, round, and reactive to light.  Neck: Normal range of motion. Neck supple.    Cardiovascular: Normal rate and regular rhythm. Exam reveals no gallop and no friction rub.  No murmur heard. Pulmonary/Chest: Effort normal and breath sounds normal. No stridor. No respiratory distress. He has no wheezes. He has no rales. He exhibits no tenderness.  Abdominal: Soft. Bowel sounds are normal. There is no tenderness. There is no rebound and no guarding.  Musculoskeletal: Normal range of motion. He exhibits no edema or tenderness.  Lymphadenopathy:    He has no cervical adenopathy.  Neurological: He is alert and oriented to person, place, and time.  Moving all extremities without focal deficit.  Sensation intact.  Skin: Skin is warm and dry. Rash noted. He is not diaphoretic. No erythema.  Nonblanching petechiae to the bilateral lower extremity worse distally in the left upper extremity.  Patient does  have a few scattered small erythematous papules that are tender to palpation.  Psychiatric: He has a normal mood and affect. His behavior is normal.  Nursing note and vitals reviewed.    ED Treatments / Results  Labs (all labs ordered are listed, but only abnormal results are displayed) Labs Reviewed  CBC WITH DIFFERENTIAL/PLATELET - Abnormal; Notable for the following components:      Result Value   WBC 11.8 (*)    Neutro Abs 9.0 (*)    Monocytes Absolute 1.5 (*)    All other components within normal limits  COMPREHENSIVE METABOLIC PANEL - Abnormal; Notable for the following components:   Sodium 130 (*)    Chloride 97 (*)    Glucose, Bld 119 (*)    All other components within normal limits  PROTIME-INR - Abnormal; Notable for the following components:   Prothrombin Time 31.1 (*)    All other components within normal limits  URINALYSIS, ROUTINE W REFLEX MICROSCOPIC - Abnormal; Notable for the following components:   Leukocytes, UA TRACE (*)    All other components within normal limits    EKG  EKG Interpretation None       Radiology Ct Abdomen Pelvis W  Contrast  Result Date: 08/02/2017 CLINICAL DATA:  History of diverticulitis, completed antibiotic treatment, follow-up EXAM: CT ABDOMEN AND PELVIS WITH CONTRAST TECHNIQUE: Multidetector CT imaging of the abdomen and pelvis was performed using the standard protocol following bolus administration of intravenous contrast. CONTRAST:  170mL ISOVUE-300 IOPAMIDOL (ISOVUE-300) INJECTION 61% COMPARISON:  CT abdomen pelvis of 07/27/2017 FINDINGS: Lower chest: There is mild bronchiectatic change involving the right lower lobe. No pneumonia or effusion is seen. The heart is mildly enlarged. Hepatobiliary: The liver enhances and there is no change in the small low-attenuation structure inferiorly in the right lobe medially most consistent with benign process such as cyst. No other hepatic abnormality is seen. No calcified gallstones are noted. There is however slightly higher attenuation near the neck of the gallbladder and small gallstones or sludge would be a consideration. If further assessment is warranted, ultrasound is recommended. Pancreas: The pancreas is unremarkable. The pancreatic duct is not dilated. Spleen: The spleen is stable with no abnormality noted. Adrenals/Urinary Tract: The adrenal glands appear normal. The kidneys enhance with no mass or calculus. On delayed images, the pelvocaliceal systems are unremarkable. The ureters are normal in caliber. The urinary bladder is not well distended but no abnormality is seen. Stomach/Bowel: The stomach is not well distended but no abnormality is evident. No small bowel distention is seen. The pericolonic strandiness at the junction of the distal CT descending and proximal sigmoid colon has resolved most consistent with resolved mild uncomplicated diverticulitis. Multiple rectosigmoid colon diverticula again are noted with diverticula scattered throughout the more proximal colon as well. The terminal ileum and the appendix are unremarkable. Vascular/Lymphatic: The  abdominal aorta is normal in caliber with moderate abdominal aortic atherosclerosis present. No adenopathy is seen. Reproductive: The prostate gland is normal in size for age. Other: Fat enters both inguinal canals. Musculoskeletal: The lumbar vertebrae are in normal alignment. Intervertebral disc spaces appear normal. The SI joints appear corticated. IMPRESSION: 1. Resolution of the mild diverticulitis at the junction of the distal descending and proximal sigmoid colon. 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 2. Stable bronchiectatic change in the right lower lobe. 3. Cannot exclude gallbladder sludge or small noncalcified gallstones within the neck of the gallbladder. Consider ultrasound if necessary clinically. 4. Moderate abdominal aortic atherosclerosis. Electronically Signed  By: Ivar Drape M.D.   On: 08/02/2017 13:52    Procedures Procedures (including critical care time)  Medications Ordered in ED Medications  iopamidol (ISOVUE-300) 61 % injection 100 mL (100 mLs Intravenous Contrast Given 08/02/17 1320)     Initial Impression / Assessment and Plan / ED Course  I have reviewed the triage vital signs and the nursing notes.  Pertinent labs & imaging results that were available during my care of the patient were reviewed by me and considered in my medical decision making (see chart for details).     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  Question whether induced by antibiotics.  CT abdomen with resolution of diverticulitis.  Advised stopping antibiotics and will start back on prednisone.  Patient will follow up with his infectious disease versus dermatologist.  Return precautions given.  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

## 2017-08-02 NOTE — ED Triage Notes (Signed)
PT c/o dx of diverticulitis on 07/28/17 and has been on po antibiotics at home and states started having rash to left arm and legs last night and states continued nausea and generalized weakness. PT states concern bc history of vasculitis.

## 2017-08-09 ENCOUNTER — Encounter (HOSPITAL_COMMUNITY): Payer: Self-pay

## 2017-08-09 ENCOUNTER — Inpatient Hospital Stay (HOSPITAL_COMMUNITY)
Admission: EM | Admit: 2017-08-09 | Discharge: 2017-08-11 | DRG: 190 | Disposition: A | Discharge status: Home / Self Care | Payer: Medicare Other | Attending: Internal Medicine | Admitting: Internal Medicine

## 2017-08-09 ENCOUNTER — Ambulatory Visit: Payer: Medicare Other | Admitting: Gastroenterology

## 2017-08-09 ENCOUNTER — Other Ambulatory Visit: Payer: Self-pay

## 2017-08-09 ENCOUNTER — Emergency Department (HOSPITAL_COMMUNITY): Payer: Medicare Other

## 2017-08-09 DIAGNOSIS — K5732 Diverticulitis of large intestine without perforation or abscess without bleeding: Secondary | ICD-10-CM | POA: Diagnosis present

## 2017-08-09 DIAGNOSIS — I482 Chronic atrial fibrillation, unspecified: Secondary | ICD-10-CM | POA: Diagnosis present

## 2017-08-09 DIAGNOSIS — I10 Essential (primary) hypertension: Secondary | ICD-10-CM | POA: Diagnosis present

## 2017-08-09 DIAGNOSIS — F419 Anxiety disorder, unspecified: Secondary | ICD-10-CM | POA: Diagnosis not present

## 2017-08-09 DIAGNOSIS — Z881 Allergy status to other antibiotic agents status: Secondary | ICD-10-CM | POA: Diagnosis not present

## 2017-08-09 DIAGNOSIS — Z87442 Personal history of urinary calculi: Secondary | ICD-10-CM | POA: Diagnosis not present

## 2017-08-09 DIAGNOSIS — Z7901 Long term (current) use of anticoagulants: Secondary | ICD-10-CM

## 2017-08-09 DIAGNOSIS — Z888 Allergy status to other drugs, medicaments and biological substances status: Secondary | ICD-10-CM | POA: Diagnosis not present

## 2017-08-09 DIAGNOSIS — Z91040 Latex allergy status: Secondary | ICD-10-CM

## 2017-08-09 DIAGNOSIS — Z833 Family history of diabetes mellitus: Secondary | ICD-10-CM

## 2017-08-09 DIAGNOSIS — T380X5A Adverse effect of glucocorticoids and synthetic analogues, initial encounter: Secondary | ICD-10-CM | POA: Diagnosis present

## 2017-08-09 DIAGNOSIS — J9601 Acute respiratory failure with hypoxia: Secondary | ICD-10-CM | POA: Diagnosis not present

## 2017-08-09 DIAGNOSIS — Z87891 Personal history of nicotine dependence: Secondary | ICD-10-CM | POA: Diagnosis not present

## 2017-08-09 DIAGNOSIS — K219 Gastro-esophageal reflux disease without esophagitis: Secondary | ICD-10-CM | POA: Diagnosis not present

## 2017-08-09 DIAGNOSIS — Z800 Family history of malignant neoplasm of digestive organs: Secondary | ICD-10-CM | POA: Diagnosis not present

## 2017-08-09 DIAGNOSIS — Z825 Family history of asthma and other chronic lower respiratory diseases: Secondary | ICD-10-CM

## 2017-08-09 DIAGNOSIS — Z86010 Personal history of colonic polyps: Secondary | ICD-10-CM

## 2017-08-09 DIAGNOSIS — J441 Chronic obstructive pulmonary disease with (acute) exacerbation: Principal | ICD-10-CM | POA: Diagnosis present

## 2017-08-09 DIAGNOSIS — Z801 Family history of malignant neoplasm of trachea, bronchus and lung: Secondary | ICD-10-CM | POA: Diagnosis not present

## 2017-08-09 DIAGNOSIS — D72829 Elevated white blood cell count, unspecified: Secondary | ICD-10-CM | POA: Diagnosis present

## 2017-08-09 DIAGNOSIS — Z85828 Personal history of other malignant neoplasm of skin: Secondary | ICD-10-CM | POA: Diagnosis not present

## 2017-08-09 LAB — CBC
HCT: 44.3 % (ref 39.0–52.0)
Hemoglobin: 14.3 g/dL (ref 13.0–17.0)
MCH: 30.3 pg (ref 26.0–34.0)
MCHC: 32.3 g/dL (ref 30.0–36.0)
MCV: 93.9 fL (ref 78.0–100.0)
Platelets: 362 10*3/uL (ref 150–400)
RBC: 4.72 MIL/uL (ref 4.22–5.81)
RDW: 13.8 % (ref 11.5–15.5)
WBC: 18 10*3/uL — ABNORMAL HIGH (ref 4.0–10.5)

## 2017-08-09 LAB — BLOOD GAS, ARTERIAL
Acid-Base Excess: 6 mmol/L — ABNORMAL HIGH (ref 0.0–2.0)
Bicarbonate: 29.5 mmol/L — ABNORMAL HIGH (ref 20.0–28.0)
Drawn by: 21310
O2 Content: 3 L/min
O2 Saturation: 95.7 %
Patient temperature: 37
pCO2 arterial: 42.8 mmHg (ref 32.0–48.0)
pH, Arterial: 7.459 — ABNORMAL HIGH (ref 7.350–7.450)
pO2, Arterial: 80.7 mmHg — ABNORMAL LOW (ref 83.0–108.0)

## 2017-08-09 LAB — BASIC METABOLIC PANEL
Anion gap: 13 (ref 5–15)
BUN: 23 mg/dL — ABNORMAL HIGH (ref 6–20)
CO2: 30 mmol/L (ref 22–32)
Calcium: 9.1 mg/dL (ref 8.9–10.3)
Chloride: 91 mmol/L — ABNORMAL LOW (ref 101–111)
Creatinine, Ser: 1.12 mg/dL (ref 0.61–1.24)
GFR calc Af Amer: >60 mL/min (ref >60)
GFR calc non Af Amer: >60 mL/min (ref >60)
Glucose, Bld: 113 mg/dL — ABNORMAL HIGH (ref 65–99)
Potassium: 3.6 mmol/L (ref 3.5–5.1)
Sodium: 134 mmol/L — ABNORMAL LOW (ref 135–145)

## 2017-08-09 LAB — PROTIME-INR
INR: 1.96
Prothrombin Time: 22.1 seconds — ABNORMAL HIGH (ref 11.4–15.2)

## 2017-08-09 LAB — TROPONIN I: Troponin I: <0.03 ng/mL (ref <0.03)

## 2017-08-09 LAB — BRAIN NATRIURETIC PEPTIDE: B Natriuretic Peptide: 94 pg/mL (ref 0.0–100.0)

## 2017-08-09 MED ORDER — ADULT MULTIVITAMIN W/MINERALS CH
1.0000 | ORAL_TABLET | Freq: Every day | ORAL | Status: DC
Start: 2017-08-09 — End: 2017-08-11
  Administered 2017-08-09 – 2017-08-11 (×3): 1 via ORAL
  Filled 2017-08-09 (×3): qty 1

## 2017-08-09 MED ORDER — PANTOPRAZOLE SODIUM 40 MG PO TBEC
40.0000 mg | DELAYED_RELEASE_TABLET | Freq: Every day | ORAL | Status: DC
  Administered 2017-08-09 – 2017-08-11 (×3): 40 mg via ORAL
  Filled 2017-08-09 (×3): qty 1

## 2017-08-09 MED ORDER — ALPRAZOLAM 0.25 MG PO TABS
0.2500 mg | ORAL_TABLET | Freq: Two times a day (BID) | ORAL | Status: DC | PRN
  Administered 2017-08-10: 0.25 mg via ORAL
  Filled 2017-08-09: qty 1

## 2017-08-09 MED ORDER — LORATADINE 10 MG PO TABS
10.0000 mg | ORAL_TABLET | Freq: Every day | ORAL | Status: DC
  Administered 2017-08-09 – 2017-08-11 (×2): 10 mg via ORAL
  Filled 2017-08-09 (×3): qty 1

## 2017-08-09 MED ORDER — IPRATROPIUM BROMIDE 0.02 % IN SOLN
0.5000 mg | Freq: Once | RESPIRATORY_TRACT | Status: AC
  Administered 2017-08-09: 0.5 mg via RESPIRATORY_TRACT
  Filled 2017-08-09: qty 2.5

## 2017-08-09 MED ORDER — WARFARIN - PHARMACIST DOSING INPATIENT
Freq: Every day | Status: DC
  Administered 2017-08-09 – 2017-08-10 (×2)

## 2017-08-09 MED ORDER — GUAIFENESIN-DM 100-10 MG/5ML PO SYRP
5.0000 mL | ORAL_SOLUTION | ORAL | Status: DC | PRN
  Administered 2017-08-11: 5 mL via ORAL
  Filled 2017-08-09: qty 5

## 2017-08-09 MED ORDER — LOSARTAN POTASSIUM-HCTZ 50-12.5 MG PO TABS
1.0000 | ORAL_TABLET | Freq: Every day | ORAL | Status: DC
Start: 2017-08-09 — End: 2017-08-09

## 2017-08-09 MED ORDER — SODIUM CHLORIDE 0.9% FLUSH
3.0000 mL | Freq: Two times a day (BID) | INTRAVENOUS | Status: DC
  Administered 2017-08-09 – 2017-08-11 (×5): 3 mL via INTRAVENOUS

## 2017-08-09 MED ORDER — LOSARTAN POTASSIUM-HCTZ 50-12.5 MG PO TABS: 1.0000 | ORAL_TABLET | Freq: Every day | ORAL | Status: DC

## 2017-08-09 MED ORDER — LEVALBUTEROL HCL 1.25 MG/0.5ML IN NEBU: 15.0000 mg | INHALATION_SOLUTION | RESPIRATORY_TRACT | Status: DC

## 2017-08-09 MED ORDER — METOPROLOL SUCCINATE ER 50 MG PO TB24
50.0000 mg | ORAL_TABLET | Freq: Two times a day (BID) | ORAL | Status: DC
  Administered 2017-08-09 – 2017-08-11 (×5): 50 mg via ORAL
  Filled 2017-08-09 (×8): qty 1

## 2017-08-09 MED ORDER — LEVALBUTEROL HCL 0.63 MG/3ML IN NEBU
INHALATION_SOLUTION | RESPIRATORY_TRACT | Status: AC
  Filled 2017-08-09: qty 3

## 2017-08-09 MED ORDER — IPRATROPIUM BROMIDE 0.02 % IN SOLN
0.5000 mg | Freq: Four times a day (QID) | RESPIRATORY_TRACT | Status: DC
  Administered 2017-08-09 – 2017-08-11 (×9): 0.5 mg via RESPIRATORY_TRACT
  Filled 2017-08-09 (×9): qty 2.5

## 2017-08-09 MED ORDER — FLUTICASONE PROPIONATE 50 MCG/ACT NA SUSP: 1.0000 | Freq: Every day | NASAL | Status: DC | PRN

## 2017-08-09 MED ORDER — LEVALBUTEROL HCL 0.63 MG/3ML IN NEBU: 0.6300 mg | INHALATION_SOLUTION | Freq: Four times a day (QID) | RESPIRATORY_TRACT | Status: DC | PRN

## 2017-08-09 MED ORDER — WARFARIN SODIUM 2.5 MG PO TABS
2.5000 mg | ORAL_TABLET | Freq: Every day | ORAL | Status: DC
  Administered 2017-08-09: 2.5 mg via ORAL
  Filled 2017-08-09 (×2): qty 1

## 2017-08-09 MED ORDER — TRIAMCINOLONE ACETONIDE 55 MCG/ACT NA AERO
1.0000 | INHALATION_SPRAY | Freq: Every day | NASAL | Status: DC | PRN
  Filled 2017-08-09: qty 10.8

## 2017-08-09 MED ORDER — LEVALBUTEROL HCL 0.63 MG/3ML IN NEBU
0.6300 mg | INHALATION_SOLUTION | Freq: Once | RESPIRATORY_TRACT | Status: AC
  Administered 2017-08-09: 0.63 mg via RESPIRATORY_TRACT

## 2017-08-09 MED ORDER — LEVALBUTEROL HCL 0.63 MG/3ML IN NEBU: 0.6300 mg | INHALATION_SOLUTION | Freq: Once | RESPIRATORY_TRACT | Status: DC

## 2017-08-09 MED ORDER — ENOXAPARIN SODIUM 40 MG/0.4ML ~~LOC~~ SOLN: 40.0000 mg | SUBCUTANEOUS | Status: DC

## 2017-08-09 MED ORDER — ACETAMINOPHEN 650 MG RE SUPP: 650.0000 mg | Freq: Four times a day (QID) | RECTAL | Status: DC | PRN

## 2017-08-09 MED ORDER — SODIUM CHLORIDE 0.9 % IV SOLN: 250.0000 mL | INTRAVENOUS | Status: DC | PRN

## 2017-08-09 MED ORDER — POLYVINYL ALCOHOL 1.4 % OP SOLN
1.0000 [drp] | OPHTHALMIC | Status: DC | PRN
  Administered 2017-08-10: 1 [drp] via OPHTHALMIC
  Filled 2017-08-09: qty 15

## 2017-08-09 MED ORDER — ACETAMINOPHEN 325 MG PO TABS: 650.0000 mg | ORAL_TABLET | Freq: Four times a day (QID) | ORAL | Status: DC | PRN

## 2017-08-09 MED ORDER — METHYLPREDNISOLONE SODIUM SUCC 125 MG IJ SOLR
125.0000 mg | Freq: Once | INTRAMUSCULAR | Status: AC
  Administered 2017-08-09: 125 mg via INTRAVENOUS
  Filled 2017-08-09: qty 2

## 2017-08-09 MED ORDER — MOMETASONE FURO-FORMOTEROL FUM 200-5 MCG/ACT IN AERO
2.0000 | INHALATION_SPRAY | Freq: Two times a day (BID) | RESPIRATORY_TRACT | Status: DC
  Administered 2017-08-09 – 2017-08-11 (×5): 2 via RESPIRATORY_TRACT
  Filled 2017-08-09: qty 8.8

## 2017-08-09 MED ORDER — ONDANSETRON HCL 4 MG/2ML IJ SOLN: 4.0000 mg | Freq: Four times a day (QID) | INTRAMUSCULAR | Status: DC | PRN

## 2017-08-09 MED ORDER — HYPROMELLOSE (GONIOSCOPIC) 2.5 % OP SOLN
1.0000 [drp] | OPHTHALMIC | Status: DC | PRN
  Filled 2017-08-09: qty 15

## 2017-08-09 MED ORDER — MAGNESIUM SULFATE 2 GM/50ML IV SOLN
2.0000 g | Freq: Once | INTRAVENOUS | Status: AC
  Administered 2017-08-09: 2 g via INTRAVENOUS
  Filled 2017-08-09: qty 50

## 2017-08-09 MED ORDER — HYDROCODONE-ACETAMINOPHEN 5-325 MG PO TABS: 1.0000 | ORAL_TABLET | Freq: Two times a day (BID) | ORAL | Status: DC | PRN

## 2017-08-09 MED ORDER — METHYLPREDNISOLONE SODIUM SUCC 125 MG IJ SOLR
80.0000 mg | Freq: Two times a day (BID) | INTRAMUSCULAR | Status: DC
  Administered 2017-08-09 – 2017-08-11 (×5): 80 mg via INTRAVENOUS
  Filled 2017-08-09 (×6): qty 2

## 2017-08-09 MED ORDER — LOSARTAN POTASSIUM 50 MG PO TABS
50.0000 mg | ORAL_TABLET | Freq: Every day | ORAL | Status: DC
  Administered 2017-08-09 – 2017-08-11 (×3): 50 mg via ORAL
  Filled 2017-08-09 (×3): qty 1

## 2017-08-09 MED ORDER — HYDROCHLOROTHIAZIDE 25 MG PO TABS
25.0000 mg | ORAL_TABLET | Freq: Every day | ORAL | Status: DC
  Administered 2017-08-09 – 2017-08-11 (×3): 25 mg via ORAL
  Filled 2017-08-09 (×3): qty 1

## 2017-08-09 MED ORDER — ONDANSETRON HCL 4 MG PO TABS: 4.0000 mg | ORAL_TABLET | Freq: Four times a day (QID) | ORAL | Status: DC | PRN

## 2017-08-09 MED ORDER — DOXYCYCLINE HYCLATE 100 MG PO TABS
100.0000 mg | ORAL_TABLET | Freq: Two times a day (BID) | ORAL | Status: DC
  Administered 2017-08-09 – 2017-08-11 (×5): 100 mg via ORAL
  Filled 2017-08-09 (×5): qty 1

## 2017-08-09 MED ORDER — SODIUM CHLORIDE 0.9% FLUSH: 3.0000 mL | INTRAVENOUS | Status: DC | PRN

## 2017-08-09 NOTE — Care Management Obs Status (Signed)
[place] NOTIFICATION   Patient Details  Name: Matthew Hall MRN: [id number] Date of Birth: [birth year]   Medicare Observation Status Notification Given:  Yes    Sherald Barge, RN 08/09/2017, 9:17 AM

## 2017-08-09 NOTE — H&P (Addendum)
History and Physical    [name]:[id number] DOB: 1946/07/08 DOA: 08/09/2017  PCP: [name], MD   Patient coming from: Home  Chief Complaint: Shortness of breath  HPI: Matthew Hall is a 72 y.o. male with medical history significant for atrial fibrillation on Coumadin, hypertension, and COPD who was recently treated for diverticulitis with Flagyl and Bactrim and eventually developed a rash suggestive of small vessel vasculitis for which he was placed on prednisone taper.  He states that his reaction was ultimately noted to be related to his statin medication.  He developed a worsening cough with congestion as well as dyspnea over the past 2 days and did not respond to his Xopenex at home.  He denies any fever or chills and his cough is productive of yellowish green mucus.  He denies any chest pain or lower extremity edema.  He denies any further symptoms of diverticulitis and states he has no abdominal pain or diarrhea.   ED Course: He has been treated with breathing treatments and given magnesium sulfate as well as IV Solu-Medrol with improvement in his symptoms.  He continues to remain on oxygen with 3 L nasal cannula and does not wear any oxygen at home.  Chest x-ray does not demonstrate any acute findings.  Review of Systems: As per HPI otherwise 10 point review of systems negative.   Past Medical History:  Diagnosis Date  . Allergy   . Anxiety   . Atrial fibrillation ([place])   . Bladder cancer ([place])    1996  . Bladder tumor   . Colon polyps   . COPD (chronic obstructive pulmonary disease) ([place])   . Emphysema/COPD ([place])   . GERD (gastroesophageal reflux disease)   . Hypertension   . Kidney stone   . Pneumonia   . Skin cancer     Past Surgical History:  Procedure Laterality Date  . bladder tumor removed  1996  . COLONOSCOPY    . exc skin cancee Left    2003  . Kidney stones removed  1996     reports that he quit smoking about 6 years ago. His  smoking use included cigarettes. He has a 30.00 pack-year smoking history. he has never used smokeless tobacco. He reports that he does not drink alcohol or use drugs.  Allergies  Allergen Reactions  . Simvastatin Rash  . Albuterol Other (See Comments)    Increases heartrate!!!   . Augmentin [Amoxicillin-Pot Clavulanate] Rash  . Ciprofloxacin Rash  . Corticosteroids Rash    Patient is unaware of this allergy  . Latex Hives and Rash    Family History  Problem Relation Age of Onset  . Emphysema Father   . Lung cancer Brother   . Liver cancer Brother   . Diabetes Mother   . Diabetes Sister   . Colon cancer Neg Hx     Prior to Admission medications   Medication Sig Start Date End Date Taking? Authorizing Provider  albuterol (PROVENTIL HFA;VENTOLIN HFA) 108 (90 BASE) MCG/ACT inhaler Inhale 2 puffs into the lungs every 6 (six) hours as needed for wheezing or shortness of breath. Patient not taking: Reported on 07/27/2017 08/03/14   Rigoberto Noel, MD  ALPRAZolam Duanne Moron) 0.25 MG tablet Take 0.25 mg by mouth 2 (two) times daily as needed for anxiety or sleep.  07/01/14   [provider]  budesonide-formoterol (SYMBICORT) 160-4.5 MCG/ACT inhaler Inhale 2 puffs into the lungs 2 (two) times daily. 12/01/14   Rigoberto Noel,  MD  cetirizine (ZYRTEC) 5 MG tablet Take 5 mg by mouth daily.    [provider]  HYDROcodone-acetaminophen (NORCO/VICODIN) 5-325 MG tablet 1 tab PO q12 hours prn pain 07/27/17   Francine Graven, DO  hydroxypropyl methylcellulose / hypromellose (ISOPTO TEARS / GONIOVISC) 2.5 % ophthalmic solution Place 1 drop into both eyes as needed for dry eyes.    [provider]  levalbuterol Penne Lash) 0.63 MG/3ML nebulizer solution Take 3 mLs (0.63 mg total) by nebulization every 6 (six) hours as needed for wheezing or shortness of breath. 10/19/16   Rigoberto Noel, MD  losartan-hydrochlorothiazide (HYZAAR) 50-12.5 MG tablet Take 1 tablet by mouth daily.     [provider]  metoprolol succinate (TOPROL-XL) 50 MG 24 hr tablet Take 1 tablet (50 mg total) by mouth 2 (two) times daily. 07/11/16   Thurnell Lose, MD  Multiple Vitamin (MULTIVITAMIN WITH MINERALS) TABS tablet Take 1 tablet by mouth daily.    [provider]  omeprazole (PRILOSEC) 40 MG capsule Take 40 mg by mouth daily.    [provider]  predniSONE (DELTASONE) 10 MG tablet 4tabs x 2 days, 3tabs x2 days, 2 tabs x2 days, 1 tab x 2 days then stop 08/02/17   Julianne Rice, MD  triamcinolone (NASACORT) 55 MCG/ACT AERO nasal inhaler Place 1 spray into the nose daily as needed (Congestion).     [provider]  warfarin (COUMADIN) 2.5 MG tablet Take 2.5 mg by mouth daily.    [provider]    Physical Exam: Vitals:   08/09/17 0302 08/09/17 0304 08/09/17 0340 08/09/17 0427  BP:  (!) 164/91    Pulse:  91    Resp:  (!) 37    Temp:  99.2 F (37.3 C)    TempSrc:  Oral    SpO2:  (!) 88% 98% 96%  Weight: 86.2 kg (190 lb)     Height: 5\' 11"  (1.803 m)       Constitutional: NAD, calm, comfortable Vitals:   08/09/17 0302 08/09/17 0304 08/09/17 0340 08/09/17 0427  BP:  (!) 164/91    Pulse:  91    Resp:  (!) 37    Temp:  99.2 F (37.3 C)    TempSrc:  Oral    SpO2:  (!) 88% 98% 96%  Weight: 86.2 kg (190 lb)     Height: 5\' 11"  (1.803 m)      Eyes: lids and conjunctivae normal ENMT: Mucous membranes are moist.  Neck: normal, supple Respiratory: clear to auscultation bilaterally. Normal respiratory effort. No accessory muscle use. On 3L [place]. Cardiovascular: irregular rate and rhythm, no murmurs. No extremity edema. Abdomen: no tenderness, no distention. Bowel sounds positive.  Musculoskeletal:  No joint deformity upper and lower extremities.   Skin: no rashes, lesions, ulcers.  Psychiatric: Normal judgment and insight. Alert and oriented x 3. Normal mood.   Labs on Admission: I have personally reviewed following labs and imaging  studies  CBC: Recent Labs  Lab 08/02/17 0955 08/09/17 0322  WBC 11.8* 18.0*  NEUTROABS 9.0*  --   HGB 13.8 14.3  HCT 42.4 44.3  MCV 91.0 93.9  PLT 355 195   Basic Metabolic Panel: Recent Labs  Lab 08/02/17 0955 08/09/17 0322  NA 130* 134*  K 4.2 3.6  CL 97* 91*  CO2 24 30  GLUCOSE 119* 113*  BUN 14 23*  CREATININE 1.14 1.12  CALCIUM 9.2 9.1   GFR: Estimated Creatinine Clearance: 62.6 mL/min (by C-G formula  based on SCr of 1.12 mg/dL). Liver Function Tests: Recent Labs  Lab 08/02/17 0955  AST 41  ALT 46  ALKPHOS 52  BILITOT 0.5  PROT 7.5  ALBUMIN 3.6   No results for input(s): LIPASE, AMYLASE in the last 168 hours. No results for input(s): AMMONIA in the last 168 hours. Coagulation Profile: Recent Labs  Lab 08/02/17 0955 08/09/17 0322  INR 3.03 1.96   Cardiac Enzymes: Recent Labs  Lab 08/09/17 0322  TROPONINI <0.03   BNP (last 3 results) No results for input(s): PROBNP in the last 8760 hours. HbA1C: No results for input(s): HGBA1C in the last 72 hours. CBG: No results for input(s): GLUCAP in the last 168 hours. Lipid Profile: No results for input(s): CHOL, HDL, LDLCALC, TRIG, CHOLHDL, LDLDIRECT in the last 72 hours. Thyroid Function Tests: No results for input(s): TSH, T4TOTAL, FREET4, T3FREE, THYROIDAB in the last 72 hours. Anemia Panel: No results for input(s): VITAMINB12, FOLATE, FERRITIN, TIBC, IRON, RETICCTPCT in the last 72 hours. Urine analysis:    Component Value Date/Time   COLORURINE YELLOW 08/02/2017 0935   APPEARANCEUR CLEAR 08/02/2017 0935   LABSPEC 1.011 08/02/2017 0935   PHURINE 6.0 08/02/2017 0935   GLUCOSEU NEGATIVE 08/02/2017 0935   HGBUR NEGATIVE 08/02/2017 0935   BILIRUBINUR NEGATIVE 08/02/2017 0935   KETONESUR NEGATIVE 08/02/2017 0935   PROTEINUR NEGATIVE 08/02/2017 0935   NITRITE NEGATIVE 08/02/2017 0935   LEUKOCYTESUR TRACE (A) 08/02/2017 0935    Radiological Exams on Admission: Dg Chest Portable 1  View  Result Date: 08/09/2017 CLINICAL DATA:  Worsening shortness of breath for a few days. Cough. History of atrial fibrillation, COPD, hypertension, emphysema, pneumonia, former smoker. EXAM: PORTABLE CHEST 1 VIEW COMPARISON:  02/19/2017 FINDINGS: Heart size and pulmonary vascularity are normal. Peribronchial thickening suggesting chronic bronchitis. Scarring in the lung apices and bases. No airspace disease or consolidation. No blunting of costophrenic angles. No pneumothorax. Calcification of the aorta. No change since prior study. IMPRESSION: Chronic bronchitic changes in the lungs. Apical and basilar scarring. No evidence of active pulmonary disease. Electronically Signed   By: Lucienne Capers M.D.   On: 08/09/2017 03:45    EKG: Independently reviewed. Afib.  Assessment/Plan Principal Problem:   COPD exacerbation ([place]) Active Problems:   Essential hypertension   Atrial fibrillation, chronic (HCC)    1. Acute COPD exacerbation.  Place on Solu-Medrol 80mg  IV BID due to significant hypoxemia.  Continue breathing treatments with ipratropium and Xopenex.  We will not initiate antibiotics as leukocytosis appears to be related to steroid use. 2. Acute hypoxemic respiratory failure secondary to above.  Continue oxygen support via nasal cannula and wean as tolerated.  Continue on IV steroids and wean as hypoxemia improves. 3. Chronic atrial fibrillation-rate controlled.  Continue on Coumadin as well as rate control agents. 4. Hypertension.  Continue blood pressure medications.   DVT prophylaxis: Coumadin Code Status: Full Family Communication: Wife and son at bedside Disposition Plan:Home when stable Consults called:None Admission status: Obs, med-surg   Alanee Ting Darleen Crocker DO [facility] Pager [phone]  If 7PM-7AM, please contact night-coverage [email or web address] Password [facility]  08/09/2017, 5:27 AM

## 2017-08-09 NOTE — Progress Notes (Signed)
    ANTICOAGULATION CONSULT NOTE -   Pharmacy Consult for coumadin Indication: atrial fibrillation  Allergies  Allergen Reactions  . Simvastatin Rash  . Albuterol Other (See Comments)    Increases heartrate!!!   . Augmentin [Amoxicillin-Pot Clavulanate] Rash  . Ciprofloxacin Rash  . Contrast Media [Iodinated Diagnostic Agents] Rash  . Corticosteroids Rash    Patient is unaware of this allergy  . Flagyl [Metronidazole] Rash  . Latex Hives and Rash  . Sulfa Antibiotics Rash    Patient Measurements: Height: 5\' 11"  (180.3 cm) Weight: 183 lb 10.3 oz (83.3 kg) IBW/kg (Calculated) : 75.3 HEPARIN DW (KG): 83.3   Vital Signs: Temp: 99 F (37.2 C) (01/31 0632) Temp Source: Oral (01/31 0600) BP: 124/62 (01/31 7939) Pulse Rate: 84 (01/31 0632)  Labs: Recent Labs    08/09/17 0322  HGB 14.3  HCT 44.3  PLT 362  LABPROT 22.1*  INR 1.96  CREATININE 1.12  TROPONINI <0.03   Estimated Creatinine Clearance: 62.6 mL/min (by C-G formula based on SCr of 1.12 mg/dL).  Medical History: Past Medical History:  Diagnosis Date  . Allergy   . Anxiety   . Atrial fibrillation ([place])   . Bladder cancer ([place])    1996  . Bladder tumor   . Colon polyps   . COPD (chronic obstructive pulmonary disease) ([place])   . Emphysema/COPD ([place])   . GERD (gastroesophageal reflux disease)   . Hypertension   . Kidney stone   . Pneumonia   . Skin cancer     Medications:  Scheduled:  . hydrochlorothiazide  25 mg Oral Daily  . ipratropium  0.5 mg Nebulization Q6H  . loratadine  10 mg Oral Daily  . losartan  50 mg Oral Daily  . methylPREDNISolone (SOLU-MEDROL) injection  80 mg Intravenous Q12H  . metoprolol succinate  50 mg Oral BID  . mometasone-formoterol  2 puff Inhalation BID  . multivitamin with minerals  1 tablet Oral Daily  . pantoprazole  40 mg Oral Daily  . sodium chloride flush  3 mL Intravenous Q12H  . warfarin  2.5 mg Oral q1800    Assessment: 72 yr old male on coumadin for  afib, INR=1.96. Admitted with COPD exacerbation. Continue home regimen of 2.5mg  daily.  Goal of Therapy:  INR 2-3   Plan:  Coumadin 2.5mg  today PT-INR daily Monitor for S/S of bleeding  Isac Sarna, BS [place]ena [place], BCPS Clinical Pharmacist Pager [phone] 08/09/2017

## 2017-08-09 NOTE — Progress Notes (Addendum)
PROGRESS NOTE                                                                                                                                                                                                             Patient Demographics:    Matthew Hall, is a 72 y.o. male, DOB - [birth year], EPP:[id number]  Admit date - 08/09/2017   Admitting Physician Pratik Darleen Crocker, DO  Outpatient Primary MD for the patient is Matthew Mallet, MD  LOS - 0  Outpatient Specialists: Dr Elsworth Soho (pulmonary)  Chief Complaint  Patient presents with  . Shortness of Breath       Brief Narrative   72 year old male with COPD, not on home O2, A. 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   Patient took Nicholas at home without much relief.  Patient found to be hypoxic in the ED and admitted for COPD exacerbation.  Reports that his wife was recently ill with URI symptoms.    Subjective:   Reports breathing better since admission.  Still having cough.   Assessment  & Plan :    Principal Problem:   COPD exacerbation ([place]) Acute respiratory failure with hypoxia (HCC) Continue O2 via nasal cannula (currently maintaining sats on 2-3 L).  IV Solu-Medrol, scheduled duo nebs and as needed albuterol neb.  Supportive care with antitussives and empiric doxycycline.   Active Problems:   Essential hypertension Stable.  Continue home medications.  (HCTZ, Toprol and losartan)    Atrial fibrillation, chronic (HCC) Rate controlled.  Continue Toprol and Coumadin.  Monitor INR (may elevate while on  doxycycline)  Leukocytosis Possibly due to being on tapering dose of steroids recently.     Code Status : Full code  Family Communication  : Wife at bedside  Disposition Plan  : Home when improved, possibly in the next 1-2 days  Barriers For Discharge : Active symptoms  Consults  : None  Procedures  : None  DVT Prophylaxis  : Coumadin  Lab Results  Component Value Date   PLT 362 08/09/2017  Antibiotics  :    Anti-infectives (From admission, onward)   None        Objective:   Vitals:   08/09/17 0600 08/09/17 0632 08/09/17 0740 08/09/17 0859  BP: 119/61 124/62    Pulse: 95 84    Resp: (!) 28 (!) 24    Temp:  99 F (37.2 C)    TempSrc: Oral     SpO2: 98% 96% 95% 95%  Weight:  83.3 kg (183 lb 10.3 oz)    Height:  5\' 11"  (1.803 m)      Wt Readings from Last 3 Encounters:  08/09/17 83.3 kg (183 lb 10.3 oz)  08/02/17 86.2 kg (190 lb)  07/27/17 86.2 kg (190 lb)     Intake/Output Summary (Last 24 hours) at 08/09/2017 1247 Last data filed at 08/09/2017 0900 Gross per 24 hour  Intake 120 ml  Output -  Net 120 ml     Physical Exam  Gen: not in distress HEENT:  moist mucosa, supple neck Chest: Scattered rhonchi bilaterally, fine right basilar crackles CVS:  S1&S2 irregular, no murmurs, rubs or gallop GI: soft, NT, ND,  Musculoskeletal: warm, no edema     Data Review:    CBC Recent Labs  Lab 08/09/17 0322  WBC 18.0*  HGB 14.3  HCT 44.3  PLT 362  MCV 93.9  MCH 30.3  MCHC 32.3  RDW 13.8    Chemistries  Recent Labs  Lab 08/09/17 0322  NA 134*  K 3.6  CL 91*  CO2 30  GLUCOSE 113*  BUN 23*  CREATININE 1.12  CALCIUM 9.1   ------------------------------------------------------------------------------------------------------------------ No results for input(s): CHOL, HDL, LDLCALC, TRIG, CHOLHDL, LDLDIRECT in the last 72 hours.  No results found for:  HGBA1C ------------------------------------------------------------------------------------------------------------------ No results for input(s): TSH, T4TOTAL, T3FREE, THYROIDAB in the last 72 hours.  Invalid input(s): FREET3 ------------------------------------------------------------------------------------------------------------------ No results for input(s): VITAMINB12, FOLATE, FERRITIN, TIBC, IRON, RETICCTPCT in the last 72 hours.  Coagulation profile Recent Labs  Lab 08/09/17 0322  INR 1.96    No results for input(s): DDIMER in the last 72 hours.  Cardiac Enzymes Recent Labs  Lab 08/09/17 0322  TROPONINI <0.03   ------------------------------------------------------------------------------------------------------------------    Component Value Date/Time   BNP 94.0 08/09/2017 0322    Inpatient Medications  Scheduled Meds: . hydrochlorothiazide  25 mg Oral Daily  . ipratropium  0.5 mg Nebulization Q6H  . loratadine  10 mg Oral Daily  . losartan  50 mg Oral Daily  . methylPREDNISolone (SOLU-MEDROL) injection  80 mg Intravenous Q12H  . metoprolol succinate  50 mg Oral BID  . mometasone-formoterol  2 puff Inhalation BID  . multivitamin with minerals  1 tablet Oral Daily  . pantoprazole  40 mg Oral Daily  . sodium chloride flush  3 mL Intravenous Q12H  . warfarin  2.5 mg Oral q1800  . Warfarin - Pharmacist Dosing Inpatient   Does not apply q1800   Continuous Infusions: . sodium chloride     PRN Meds:.sodium chloride, acetaminophen **OR** acetaminophen, ALPRAZolam, fluticasone, HYDROcodone-acetaminophen, levalbuterol, ondansetron **OR** ondansetron (ZOFRAN) IV, polyvinyl alcohol, sodium chloride flush  Micro Results No results found for this or any previous visit (from the past 240 hour(s)).  Radiology Reports Ct Abdomen Pelvis W Contrast  Result Date: 08/02/2017 CLINICAL DATA:  History of diverticulitis, completed antibiotic treatment, follow-up EXAM: CT  ABDOMEN AND PELVIS WITH CONTRAST TECHNIQUE: Multidetector CT imaging of the abdomen and pelvis was performed using the standard protocol following bolus administration of intravenous contrast.  CONTRAST:  130mL ISOVUE-300 IOPAMIDOL (ISOVUE-300) INJECTION 61% COMPARISON:  CT abdomen pelvis of 07/27/2017 FINDINGS: Lower chest: There is mild bronchiectatic change involving the right lower lobe. No pneumonia or effusion is seen. The heart is mildly enlarged. Hepatobiliary: The liver enhances and there is no change in the small low-attenuation structure inferiorly in the right lobe medially most consistent with benign process such as cyst. No other hepatic abnormality is seen. No calcified gallstones are noted. There is however slightly higher attenuation near the neck of the gallbladder and small gallstones or sludge would be a consideration. If further assessment is warranted, ultrasound is recommended. Pancreas: The pancreas is unremarkable. The pancreatic duct is not dilated. Spleen: The spleen is stable with no abnormality noted. Adrenals/Urinary Tract: The adrenal glands appear normal. The kidneys enhance with no mass or calculus. On delayed images, the pelvocaliceal systems are unremarkable. The ureters are normal in caliber. The urinary bladder is not well distended but no abnormality is seen. Stomach/Bowel: The stomach is not well distended but no abnormality is evident. No small bowel distention is seen. The pericolonic strandiness at the junction of the distal CT descending and proximal sigmoid colon has resolved most consistent with resolved mild uncomplicated diverticulitis. Multiple rectosigmoid colon diverticula again are noted with diverticula scattered throughout the more proximal colon as well. The terminal ileum and the appendix are unremarkable. Vascular/Lymphatic: The abdominal aorta is normal in caliber with moderate abdominal aortic atherosclerosis present. No adenopathy is seen. Reproductive: The  prostate gland is normal in size for age. Other: Fat enters both inguinal canals. Musculoskeletal: The lumbar vertebrae are in normal alignment. Intervertebral disc spaces appear normal. The SI joints appear corticated. IMPRESSION: 1. Resolution of the mild diverticulitis at the junction of the distal descending and proximal sigmoid colon. 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 2. Stable bronchiectatic change in the right lower lobe. 3. Cannot exclude gallbladder sludge or small noncalcified gallstones within the neck of the gallbladder. Consider ultrasound if necessary clinically. 4. Moderate abdominal aortic atherosclerosis. Electronically Signed   By: Ivar Drape M.D.   On: 08/02/2017 13:52   Dg Chest Portable 1 View  Result Date: 08/09/2017 CLINICAL DATA:  Worsening shortness of breath for a few days. Cough. History of atrial fibrillation, COPD, hypertension, emphysema, pneumonia, former smoker. EXAM: PORTABLE CHEST 1 VIEW COMPARISON:  02/19/2017 FINDINGS: Heart size and pulmonary vascularity are normal. Peribronchial thickening suggesting chronic bronchitis. Scarring in the lung apices and bases. No airspace disease or consolidation. No blunting of costophrenic angles. No pneumothorax. Calcification of the aorta. No change since prior study. IMPRESSION: Chronic bronchitic changes in the lungs. Apical and basilar scarring. No evidence of active pulmonary disease. Electronically Signed   By: Lucienne Capers M.D.   On: 08/09/2017 03:45   Ct Renal Stone Study  Result Date: 07/27/2017 CLINICAL DATA:  Left abdominal pain for 2 days. EXAM: CT ABDOMEN AND PELVIS WITHOUT CONTRAST TECHNIQUE: Multidetector CT imaging of the abdomen and pelvis was performed following the standard protocol without IV contrast. COMPARISON:  None. FINDINGS: Lower chest: Bronchiectasis, peribronchial thickening in the right lower lobe. Airspace disease versus atelectasis in the right middle lobe, partially visualized. Probable  atelectasis in the lingula. Hepatobiliary: Incompletely characterized 1.2 cm right hepatic hypoattenuated lesion. Normal gallbladder. Pancreas: Unremarkable. No pancreatic ductal dilatation or surrounding inflammatory changes. Spleen: Normal in size without focal abnormality. Adrenals/Urinary Tract: Adrenal glands are unremarkable. Kidneys are normal, without renal calculi, focal lesion, or hydronephrosis. Nonspecific bilateral perinephric stranding. Bladder is unremarkable. Stomach/Bowel: Normal  appearance of the stomach and small bowel. Normal appendix. Extensive left colonic diverticulosis. Long segment of circumferential mucosal thickening of the sigmoid colon. Focal area of pericolonic inflammatory changes at the junction of distal descending colon/proximal sigmoid colon with focal asymmetric mucosal thickening of the bowel, image 53/86, sequence 2, axial images and image 40/98, sequence 5, coronal images. Vascular/Lymphatic: Aortic atherosclerosis. No enlarged abdominal or pelvic lymph nodes. Reproductive: Prostate is unremarkable. Other: Bilateral fat containing inguinal hernias and small fat containing periumbilical anterior abdominal wall hernia. Musculoskeletal: No acute or significant osseous findings. IMPRESSION: Focal asymmetric mucosal thickening at the junction of the descending colon/proximal sigmoid colon with pericolonic inflammatory changes, on the background of extensive diverticulosis of the left colon and long segment of circumferential mucosal thickening of the sigmoid colon. These findings may represent acute on chronic diverticulitis. However primary colonic malignancy cannot be excluded. Correlation to colonoscopy, after resolution of the acute symptomatology may be considered. No evidence of colonic rupture or abscess formation. 12 mm incompletely characterized due to lack of IV contrast lesion within the right lobe of the liver. Further evaluation with contrast-enhanced CT or MRI of the  abdomen may be considered. Right lower lobe bronchiectasis and peribronchial thickening, likely due to chronic bronchitis. Incompletely visualized atelectasis versus airspace consolidation in the right middle lobe. Electronically Signed   By: Fidela Salisbury M.D.   On: 07/27/2017 17:00    Time Spent in minutes  25   Adrian Specht M.D on 08/09/2017 at 12:47 PM  Between 7am to 7pm - Pager - [phone]  After 7pm go to [email or web address] - password [facility]  [facility] -  Office  [phone]

## 2017-08-09 NOTE — ED Triage Notes (Signed)
Pt reports sob worse over the past couple of days, using xopenex at home for same.  Pt denies cp

## 2017-08-09 NOTE — ED Provider Notes (Signed)
[facility] EMERGENCY DEPARTMENT Provider Note   CSN: [id number] Arrival date & time: 08/09/17  0253     History   Chief Complaint Chief Complaint  Patient presents with  . Shortness of Breath    HPI Matthew Hall is a 72 y.o. male.  Patient with history of COPD, atrial fibrillation on Coumadin presenting with difficulty breathing, cough and congestion worsening over the past several days.  States breathing became bad last night did not respond to multiple Xopenex is at home.  He denies fever.  Cough productive of yellow and green mucus.  No chest pain.  No leg pain or leg swelling.  Patient is currently on a prednisone taper for about the past week and took 20 mg of prednisone yesterday.  He was also treated for diverticulitis several weeks ago and finished 5-day course of antibiotics after developing a rash to Flagyl.  Denies any abdominal pain or diarrhea.  Denies any leg pain or leg swelling.   The history is provided by the patient and a relative.  Shortness of Breath  Associated symptoms include cough. Pertinent negatives include no headaches, no rhinorrhea, no chest pain, no vomiting and no abdominal pain.    Past Medical History:  Diagnosis Date  . Allergy   . Anxiety   . Atrial fibrillation ([place])   . Bladder cancer ([place])    1996  . Bladder tumor   . Colon polyps   . COPD (chronic obstructive pulmonary disease) ([place])   . Emphysema/COPD ([place])   . GERD (gastroesophageal reflux disease)   . Hypertension   . Kidney stone   . Pneumonia   . Skin cancer     Patient Active Problem List   Diagnosis Date Noted  . Elevated sed rate 05/21/2017  . COPD without exacerbation ([place]) 03/01/2017  . 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 Atrial fibrillation, chronic ([place]) 07/09/2016  . BRONCHIECTASIS W/O ACUTE EXACERBATION 03/31/2008  . Essential hypertension 09/13/2007  . Allergic rhinitis 09/13/2007  . EMPHYSEMA 09/13/2007  . COPD exacerbation ([place]) 09/13/2007     Past Surgical History:  Procedure Laterality Date  . bladder tumor removed  1996  . COLONOSCOPY    . exc skin cancee Left    2003  . Kidney stones removed  1996       Home Medications    Prior to Admission medications   Medication Sig Start Date End Date Taking? Authorizing Provider  albuterol (PROVENTIL HFA;VENTOLIN HFA) 108 (90 BASE) MCG/ACT inhaler Inhale 2 puffs into the lungs every 6 (six) hours as needed for wheezing or shortness of breath. Patient not taking: Reported on 07/27/2017 08/03/14   Rigoberto Noel, MD  ALPRAZolam Duanne Moron) 0.25 MG tablet Take 0.25 mg by mouth 2 (two) times daily as needed for anxiety or sleep.  07/01/14   [provider]  budesonide-formoterol (SYMBICORT) 160-4.5 MCG/ACT inhaler Inhale 2 puffs into the lungs 2 (two) times daily. 12/01/14   Rigoberto Noel, MD  cetirizine (ZYRTEC) 5 MG tablet Take 5 mg by mouth daily.    [provider]  HYDROcodone-acetaminophen (NORCO/VICODIN) 5-325 MG tablet 1 tab PO q12 hours prn pain 07/27/17   Francine Graven, DO  hydroxypropyl methylcellulose / hypromellose (ISOPTO TEARS / GONIOVISC) 2.5 % ophthalmic solution Place 1 drop into both eyes as needed for dry eyes.    [provider]  levalbuterol Penne Lash) 0.63 MG/3ML nebulizer solution Take 3 mLs (0.63 mg total) by nebulization every 6 (six) hours as needed for wheezing or  shortness of breath. 10/19/16   Rigoberto Noel, MD  losartan-hydrochlorothiazide (HYZAAR) 50-12.5 MG tablet Take 1 tablet by mouth daily.    [provider]  metoprolol succinate (TOPROL-XL) 50 MG 24 hr tablet Take 1 tablet (50 mg total) by mouth 2 (two) times daily. 07/11/16   Thurnell Lose, MD  Multiple Vitamin (MULTIVITAMIN WITH MINERALS) TABS tablet Take 1 tablet by mouth daily.    [provider]  omeprazole (PRILOSEC) 40 MG capsule Take 40 mg by mouth daily.    [provider]  predniSONE (DELTASONE) 10 MG tablet 4tabs x 2 days, 3tabs x2  days, 2 tabs x2 days, 1 tab x 2 days then stop 08/02/17   Julianne Rice, MD  triamcinolone (NASACORT) 55 MCG/ACT AERO nasal inhaler Place 1 spray into the nose daily as needed (Congestion).     [provider]  warfarin (COUMADIN) 2.5 MG tablet Take 2.5 mg by mouth daily.    [provider]    Family History Family History  Problem Relation Age of Onset  . Emphysema Father   . Lung cancer Brother   . Liver cancer Brother   . Diabetes Mother   . Diabetes Sister   . Colon cancer Neg Hx     Social History Social History   Tobacco Use  . Smoking status: Former Smoker    Packs/day: 1.00    Years: 30.00    Pack years: 30.00    Types: Cigarettes    Last attempt to quit: 07/07/2011    Years since quitting: 6.0  . Smokeless tobacco: Never Used  Substance Use Topics  . Alcohol use: No    Alcohol/week: 0.0 oz  . Drug use: No     Allergies   Simvastatin; Albuterol; Augmentin [amoxicillin-pot clavulanate]; Ciprofloxacin; Corticosteroids; and Latex   Review of Systems Review of Systems  Constitutional: Negative for activity change and appetite change.  HENT: Negative for congestion and rhinorrhea.   Eyes: Negative for visual disturbance.  Respiratory: Positive for cough and shortness of breath. Negative for chest tightness.   Cardiovascular: Negative for chest pain.  Gastrointestinal: Negative for abdominal pain, diarrhea, nausea and vomiting.  Genitourinary: Negative for dysuria and hematuria.  Musculoskeletal: Negative for arthralgias and myalgias.  Skin: Negative for wound.  Neurological: Negative for dizziness, weakness and headaches.    all other systems are negative except as noted in the HPI and PMH.    Physical Exam Updated Vital Signs BP (!) 164/91 (BP Location: Right Arm)   Pulse 91   Temp 99.2 F (37.3 C) (Oral)   Resp (!) 37   Ht 5\' 11"  (1.803 m)   Wt 86.2 kg (190 lb)   SpO2 98%   BMI 26.50 kg/m   Physical Exam  Constitutional: He  is oriented to person, place, and time. He appears well-developed and well-nourished. He appears distressed.  Increased work of breathing, speaking in short phrases  HENT:  Head: Normocephalic and atraumatic.  Mouth/Throat: Oropharynx is clear and moist. No oropharyngeal exudate.  Eyes: Conjunctivae and EOM are normal. Pupils are equal, round, and reactive to light.  Neck: Normal range of motion. Neck supple.  No meningismus.  Cardiovascular: Normal rate, regular rhythm, normal heart sounds and intact distal pulses.  No murmur heard. Pulmonary/Chest: He is in respiratory distress. He has wheezes. He exhibits no tenderness.  Abdominal: Soft. There is no tenderness. There is no rebound and no guarding.  Musculoskeletal: Normal range of motion. He exhibits no edema or tenderness.  No LE edema  Neurological: He is alert and oriented to person, place, and time. No cranial nerve deficit. He exhibits normal muscle tone. Coordination normal.  No ataxia on finger to nose bilaterally. No pronator drift. 5/5 strength throughout. CN 2-12 intact.Equal grip strength. Sensation intact.   Skin: Skin is warm.  Psychiatric: He has a normal mood and affect. His behavior is normal.  Nursing note and vitals reviewed.    ED Treatments / Results  Labs (all labs ordered are listed, but only abnormal results are displayed) Labs Reviewed  BASIC METABOLIC PANEL - Abnormal; Notable for the following components:      Result Value   Sodium 134 (*)    Chloride 91 (*)    Glucose, Bld 113 (*)    BUN 23 (*)    All other components within normal limits  CBC - Abnormal; Notable for the following components:   WBC 18.0 (*)    All other components within normal limits  PROTIME-INR - Abnormal; Notable for the following components:   Prothrombin Time 22.1 (*)    All other components within normal limits  BLOOD GAS, ARTERIAL - Abnormal; Notable for the following components:   pH, Arterial 7.459 (*)    pO2, Arterial  80.7 (*)    Bicarbonate 29.5 (*)    Acid-Base Excess 6.0 (*)    All other components within normal limits  BRAIN NATRIURETIC PEPTIDE  TROPONIN I    EKG  EKG Interpretation  Date/Time:  Thursday August 09 2017 04:04:24 EST Ventricular Rate:  93 PR Interval:    QRS Duration: 126 QT Interval:  358 QTC Calculation: 446 R Axis:   -72 Text Interpretation:  Atrial fibrillation Nonspecific IVCD with LAD Anterior infarct, old Nonspecific T abnormalities, lateral leads No significant change was found Confirmed by Ezequiel Essex [phone]) on 08/09/2017 4:16:02 AM       Radiology Dg Chest Portable 1 View  Result Date: 08/09/2017 CLINICAL DATA:  Worsening shortness of breath for a few days. Cough. History of atrial fibrillation, COPD, hypertension, emphysema, pneumonia, former smoker. EXAM: PORTABLE CHEST 1 VIEW COMPARISON:  02/19/2017 FINDINGS: Heart size and pulmonary vascularity are normal. Peribronchial thickening suggesting chronic bronchitis. Scarring in the lung apices and bases. No airspace disease or consolidation. No blunting of costophrenic angles. No pneumothorax. Calcification of the aorta. No change since prior study. IMPRESSION: Chronic bronchitic changes in the lungs. Apical and basilar scarring. No evidence of active pulmonary disease. Electronically Signed   By: Lucienne Capers M.D.   On: 08/09/2017 03:45    Procedures Procedures (including critical care time)  Medications Ordered in ED Medications  magnesium sulfate IVPB 2 g 50 mL (2 g Intravenous New Bag/Given 08/09/17 0402)  levalbuterol (XOPENEX) nebulizer solution 0.63 mg (not administered)  levalbuterol (XOPENEX) nebulizer solution 0.63 mg (not administered)  methylPREDNISolone sodium succinate (SOLU-MEDROL) 125 mg/2 mL injection 125 mg (125 mg Intravenous Given 08/09/17 0401)  ipratropium (ATROVENT) nebulizer solution 0.5 mg (0.5 mg Nebulization Given 08/09/17 0339)     Initial Impression / Assessment and Plan / ED  Course  I have reviewed the triage vital signs and the nursing notes.  Pertinent labs & imaging results that were available during my care of the patient were reviewed by me and considered in my medical decision making (see chart for details).    Difficulty breathing with cough and congestion.  Tachypneic and hypoxic on room air.  Patient placed on oxygen, given nebulizers and steroids and magnesium.  Labs show leukocytosis  likely from recent steroid use.  Chest x-ray is negative for infiltrates.  EKG is reassuring.  Work of breathing improved after bronchodilators, magnesium and steroids.  Patient remains hypoxic on room air and will need admission for COPD exacerbation. Denies chest pain, leg pain or leg swelling.  Admission d/w Dr. Manuella Ghazi. Final Clinical Impressions(s) / ED Diagnoses   Final diagnoses:  COPD exacerbation [facility])    ED Discharge Orders    None       Ezequiel Essex, MD 08/09/17 [phone]

## 2017-08-09 NOTE — Progress Notes (Signed)
    ANTICOAGULATION CONSULT NOTE - Preliminary  Pharmacy Consult for coumadin Indication: atrial fibrillation  Allergies  Allergen Reactions  . Simvastatin Rash  . Albuterol Other (See Comments)    Increases heartrate!!!   . Augmentin [Amoxicillin-Pot Clavulanate] Rash  . Ciprofloxacin Rash  . Corticosteroids Rash    Patient is unaware of this allergy  . Latex Hives and Rash    Patient Measurements: Height: 5\' 11"  (180.3 cm) Weight: 190 lb (86.2 kg) IBW/kg (Calculated) : 75.3 HEPARIN DW (KG): 86.2   Vital Signs: Temp: 99.2 F (37.3 C) (01/31 0304) Temp Source: Oral (01/31 0304) BP: 164/91 (01/31 0304) Pulse Rate: 87 (01/31 0500)  Labs: Recent Labs    08/09/17 0322  HGB 14.3  HCT 44.3  PLT 362  LABPROT 22.1*  INR 1.96  CREATININE 1.12  TROPONINI <0.03   Estimated Creatinine Clearance: 62.6 mL/min (by C-G formula based on SCr of 1.12 mg/dL).  Medical History: Past Medical History:  Diagnosis Date  . Allergy   . Anxiety   . Atrial fibrillation ([place])   . Bladder cancer ([place])    1996  . Bladder tumor   . Colon polyps   . COPD (chronic obstructive pulmonary disease) ([place])   . Emphysema/COPD ([place])   . GERD (gastroesophageal reflux disease)   . Hypertension   . Kidney stone   . Pneumonia   . Skin cancer     Medications:  Scheduled:  . hydrochlorothiazide  25 mg Oral Daily  . ipratropium  0.5 mg Nebulization Q6H  . loratadine  10 mg Oral Daily  . losartan  50 mg Oral Daily  . methylPREDNISolone (SOLU-MEDROL) injection  80 mg Intravenous Q12H  . metoprolol succinate  50 mg Oral BID  . mometasone-formoterol  2 puff Inhalation BID  . multivitamin with minerals  1 tablet Oral Daily  . pantoprazole  40 mg Oral Daily  . sodium chloride flush  3 mL Intravenous Q12H  . warfarin  2.5 mg Oral q1800    Assessment: 72 yr old male on coumadin for afib, INR=1.96, in therapeutic range.   Goal of Therapy:  INR 2-3   Plan:  Continue home dose coumadin  2.5mg  daily for now.  Preliminary review of pertinent patient information completed.  Forestine Na clinical pharmacist will complete review during morning rounds to assess the patient and finalize treatment regimen.  Jerrye Beavers, PharmD, BCPS Clinical Pharmacist

## 2017-08-09 NOTE — Care Management Note (Signed)
Case Management Note  Patient Details  Name: Matthew Hall MRN: [id number] Date of Birth: 12/17/1945  Subjective/Objective:              Admitted with COPD. From home, lives with wife. ind at baseline. Drives, has PCP. Has insurance, no difficulty affording, obtaining or managing medications. He has neb machine pta. On oxygen acutely.       Action/Plan: anticipate DC home with self care. Will need to wean from supplemental oxygen prior to DC or have home O2 assessment.  Expected Discharge Date:  08/11/18               Expected Discharge Plan:  Home/Self Care  In-House Referral:  NA  Discharge planning Services  CM Consult  Post Acute Care Choice:  NA Choice offered to:  NA  Status of Service:  In process, will continue to follow  If discussed at Long Length of Stay Meetings, dates discussed:    Additional Comments:  Sherald Barge, RN 08/09/2017, 11:21 AM

## 2017-08-10 DIAGNOSIS — I10 Essential (primary) hypertension: Secondary | ICD-10-CM

## 2017-08-10 DIAGNOSIS — Z85828 Personal history of other malignant neoplasm of skin: Secondary | ICD-10-CM | POA: Diagnosis not present

## 2017-08-10 DIAGNOSIS — Z888 Allergy status to other drugs, medicaments and biological substances status: Secondary | ICD-10-CM | POA: Diagnosis not present

## 2017-08-10 DIAGNOSIS — I482 Chronic atrial fibrillation: Secondary | ICD-10-CM

## 2017-08-10 DIAGNOSIS — F419 Anxiety disorder, unspecified: Secondary | ICD-10-CM | POA: Diagnosis not present

## 2017-08-10 DIAGNOSIS — Z881 Allergy status to other antibiotic agents status: Secondary | ICD-10-CM | POA: Diagnosis not present

## 2017-08-10 DIAGNOSIS — Z825 Family history of asthma and other chronic lower respiratory diseases: Secondary | ICD-10-CM | POA: Diagnosis not present

## 2017-08-10 DIAGNOSIS — D72829 Elevated white blood cell count, unspecified: Secondary | ICD-10-CM | POA: Diagnosis not present

## 2017-08-10 DIAGNOSIS — K5732 Diverticulitis of large intestine without perforation or abscess without bleeding: Secondary | ICD-10-CM | POA: Diagnosis not present

## 2017-08-10 DIAGNOSIS — J441 Chronic obstructive pulmonary disease with (acute) exacerbation: Secondary | ICD-10-CM | POA: Diagnosis present

## 2017-08-10 DIAGNOSIS — Z801 Family history of malignant neoplasm of trachea, bronchus and lung: Secondary | ICD-10-CM | POA: Diagnosis not present

## 2017-08-10 DIAGNOSIS — Z87891 Personal history of nicotine dependence: Secondary | ICD-10-CM | POA: Diagnosis not present

## 2017-08-10 DIAGNOSIS — Z86010 Personal history of colonic polyps: Secondary | ICD-10-CM | POA: Diagnosis not present

## 2017-08-10 DIAGNOSIS — Z7901 Long term (current) use of anticoagulants: Secondary | ICD-10-CM | POA: Diagnosis not present

## 2017-08-10 DIAGNOSIS — K219 Gastro-esophageal reflux disease without esophagitis: Secondary | ICD-10-CM | POA: Diagnosis not present

## 2017-08-10 DIAGNOSIS — Z833 Family history of diabetes mellitus: Secondary | ICD-10-CM | POA: Diagnosis not present

## 2017-08-10 DIAGNOSIS — Z87442 Personal history of urinary calculi: Secondary | ICD-10-CM | POA: Diagnosis not present

## 2017-08-10 DIAGNOSIS — J9601 Acute respiratory failure with hypoxia: Secondary | ICD-10-CM | POA: Diagnosis not present

## 2017-08-10 DIAGNOSIS — Z800 Family history of malignant neoplasm of digestive organs: Secondary | ICD-10-CM | POA: Diagnosis not present

## 2017-08-10 LAB — PROTIME-INR
INR: 2.55
Prothrombin Time: 27.2 seconds — ABNORMAL HIGH (ref 11.4–15.2)

## 2017-08-10 LAB — CBC
HCT: 40.5 % (ref 39.0–52.0)
Hemoglobin: 12.9 g/dL — ABNORMAL LOW (ref 13.0–17.0)
MCH: 29.3 pg (ref 26.0–34.0)
MCHC: 31.9 g/dL (ref 30.0–36.0)
MCV: 92 fL (ref 78.0–100.0)
Platelets: 368 10*3/uL (ref 150–400)
RBC: 4.4 MIL/uL (ref 4.22–5.81)
RDW: 14 % (ref 11.5–15.5)
WBC: 22.1 10*3/uL — ABNORMAL HIGH (ref 4.0–10.5)

## 2017-08-10 MED ORDER — WARFARIN SODIUM 1 MG PO TABS
1.0000 mg | ORAL_TABLET | Freq: Once | ORAL | Status: AC
  Administered 2017-08-10: 1 mg via ORAL
  Filled 2017-08-10: qty 1

## 2017-08-10 MED ORDER — ERYTHROMYCIN 5 MG/GM OP OINT
TOPICAL_OINTMENT | Freq: Two times a day (BID) | OPHTHALMIC | Status: DC
Start: 2017-08-10 — End: 2017-08-11
  Administered 2017-08-10: 1 via OPHTHALMIC
  Filled 2017-08-10: qty 3.5

## 2017-08-10 NOTE — Plan of Care (Signed)
Pt complained of eyes burning; decreased with artifical tears. Will continue to monitor.

## 2017-08-10 NOTE — Progress Notes (Signed)
    ANTICOAGULATION CONSULT NOTE -   Pharmacy Consult for coumadin Indication: atrial fibrillation  Allergies  Allergen Reactions  . Simvastatin Rash  . Albuterol Other (See Comments)    Increases heartrate!!!   . Augmentin [Amoxicillin-Pot Clavulanate] Rash  . Ciprofloxacin Rash  . Contrast Media [Iodinated Diagnostic Agents] Rash  . Corticosteroids Rash    Patient is unaware of this allergy  . Flagyl [Metronidazole] Rash  . Latex Hives and Rash  . Sulfa Antibiotics Rash    Patient Measurements: Height: 5\' 11"  (180.3 cm) Weight: 183 lb 10.3 oz (83.3 kg) IBW/kg (Calculated) : 75.3 HEPARIN DW (KG): 83.3   Vital Signs: BP: 116/75 (02/01 0540) Pulse Rate: 93 (02/01 0540)  Labs: Recent Labs    08/09/17 0322 08/10/17 0527  HGB 14.3 12.9*  HCT 44.3 40.5  PLT 362 368  LABPROT 22.1* 27.2*  INR 1.96 2.55  CREATININE 1.12  --   TROPONINI <0.03  --    Estimated Creatinine Clearance: 62.6 mL/min (by C-G formula based on SCr of 1.12 mg/dL).  Medical History: Past Medical History:  Diagnosis Date  . Allergy   . Anxiety   . Atrial fibrillation ([place])   . Bladder cancer ([place])    1996  . Bladder tumor   . Colon polyps   . COPD (chronic obstructive pulmonary disease) ([place])   . Emphysema/COPD ([place])   . GERD (gastroesophageal reflux disease)   . Hypertension   . Kidney stone   . Pneumonia   . Skin cancer     Medications:  Scheduled:  . doxycycline  100 mg Oral Q12H  . hydrochlorothiazide  25 mg Oral Daily  . ipratropium  0.5 mg Nebulization Q6H  . loratadine  10 mg Oral Daily  . losartan  50 mg Oral Daily  . methylPREDNISolone (SOLU-MEDROL) injection  80 mg Intravenous Q12H  . metoprolol succinate  50 mg Oral BID  . mometasone-formoterol  2 puff Inhalation BID  . multivitamin with minerals  1 tablet Oral Daily  . pantoprazole  40 mg Oral Daily  . sodium chloride flush  3 mL Intravenous Q12H  . warfarin  1 mg Oral Once  . Warfarin - Pharmacist Dosing  Inpatient   Does not apply q1800    Assessment: 72 yr old male on coumadin for afib, INR=2.55. Admitted with COPD exacerbation.   Goal of Therapy:  INR 2-3   Plan:  Coumadin1mg  today PT-INR daily Monitor for S/S of bleeding  Thanks for allowing pharmacy to be a part of this patient's care.  Excell Seltzer, PharmD Clinical Pharmacist

## 2017-08-10 NOTE — Progress Notes (Signed)
PROGRESS NOTE                                                                                                                                                                                                             Patient Demographics:    Matthew Hall, is a 72 y.o. male, DOB - 1945/07/30, WUX:[id number]  Admit date - 08/09/2017   Admitting Physician Pratik Darleen Crocker, DO  Outpatient Primary MD for the patient is [name], MD  LOS - 1  Outpatient Specialists: Dr Elsworth Soho (pulmonary)  Chief Complaint  Patient presents with  . Shortness of Breath       Brief Narrative   72 year old male with COPD, not on home O2, A. 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   Patient took [place] at home without much relief.  Patient found to be hypoxic in the ED and admitted for COPD exacerbation.  Reports that his wife was recently ill with URI symptoms.    Subjective:   Having a lot of cough.  Breathing improved minimally from yesterday.   Assessment  & Plan :    Principal Problem:   COPD exacerbation ([place]) Acute respiratory failure with hypoxia (HCC) Wean oxygen as tolerated.  Continue IV Solu-Medrol, scheduled duo nebs and as needed albuterol neb.  Continue empiric doxycycline and antitussives.   Active Problems:   Essential hypertension Stable.  Continue home medications.  (HCTZ, Toprol and losartan)    Atrial fibrillation, chronic (HCC) Rate controlled.  Continue Toprol and Coumadin.  INR slowly elevated with doxycycline.  Monitor closely.  Leukocytosis Contrary to  steroid.  (Patient was on tapering dose of steroid as outpatient also)     Code Status : Full code  Family Communication  : Wife at bedside  Disposition Plan  : Home possibly tomorrow if breathing improves  Barriers For Discharge : Active symptoms  Consults  : None  Procedures  : None  DVT Prophylaxis  : Coumadin  Lab Results  Component Value Date   PLT 368 08/10/2017    Antibiotics  :  Anti-infectives (From admission, onward)   Start     Dose/Rate Route Frequency Ordered Stop   08/09/17 1330  doxycycline (VIBRA-TABS) tablet 100 mg     100 mg Oral Every 12 hours 08/09/17 1253          Objective:   Vitals:   08/09/17 2033 08/10/17 0211 08/10/17 0540 08/10/17 0807  BP: 105/61  116/75   Pulse: 94  93   Resp: 20  18   Temp: (!) 97.5 F (36.4 C)     TempSrc: Oral     SpO2: 96% 91% 94% 96%  Weight:      Height:        Wt Readings from Last 3 Encounters:  08/09/17 83.3 kg (183 lb 10.3 oz)  08/02/17 86.2 kg (190 lb)  07/27/17 86.2 kg (190 lb)     Intake/Output Summary (Last 24 hours) at 08/10/2017 1222 Last data filed at 08/09/2017 1827 Gross per 24 hour  Intake 480 ml  Output 600 ml  Net -120 ml     Physical Exam General: Elderly male not in distress HEENT: Moist mucosa, supple neck Chest: Diffuse rhonchi bilaterally, fine right basilar crackles CVS: S1 and S2 regular, no murmurs GI: Soft, nondistended, nontender Musculoskeletal: Warm, no edema      Data Review:    CBC Recent Labs  Lab 08/09/17 0322 08/10/17 0527  WBC 18.0* 22.1*  HGB 14.3 12.9*  HCT 44.3 40.5  PLT 362 368  MCV 93.9 92.0  MCH 30.3 29.3  MCHC 32.3 31.9  RDW 13.8 14.0    Chemistries  Recent Labs  Lab 08/09/17 0322  NA 134*  K 3.6  CL 91*  CO2 30  GLUCOSE 113*  BUN 23*  CREATININE 1.12  CALCIUM 9.1   ------------------------------------------------------------------------------------------------------------------ No results for input(s): CHOL, HDL,  LDLCALC, TRIG, CHOLHDL, LDLDIRECT in the last 72 hours.  No results found for: HGBA1C ------------------------------------------------------------------------------------------------------------------ No results for input(s): TSH, T4TOTAL, T3FREE, THYROIDAB in the last 72 hours.  Invalid input(s): FREET3 ------------------------------------------------------------------------------------------------------------------ No results for input(s): VITAMINB12, FOLATE, FERRITIN, TIBC, IRON, RETICCTPCT in the last 72 hours.  Coagulation profile Recent Labs  Lab 08/09/17 0322 08/10/17 0527  INR 1.96 2.55    No results for input(s): DDIMER in the last 72 hours.  Cardiac Enzymes Recent Labs  Lab 08/09/17 0322  TROPONINI <0.03   ------------------------------------------------------------------------------------------------------------------    Component Value Date/Time   BNP 94.0 08/09/2017 0322    Inpatient Medications  Scheduled Meds: . doxycycline  100 mg Oral Q12H  . hydrochlorothiazide  25 mg Oral Daily  . ipratropium  0.5 mg Nebulization Q6H  . loratadine  10 mg Oral Daily  . losartan  50 mg Oral Daily  . methylPREDNISolone (SOLU-MEDROL) injection  80 mg Intravenous Q12H  . metoprolol succinate  50 mg Oral BID  . mometasone-formoterol  2 puff Inhalation BID  . multivitamin with minerals  1 tablet Oral Daily  . pantoprazole  40 mg Oral Daily  . sodium chloride flush  3 mL Intravenous Q12H  . warfarin  1 mg Oral Once  . Warfarin - Pharmacist Dosing Inpatient   Does not apply q1800   Continuous Infusions: . sodium chloride     PRN Meds:.sodium chloride, acetaminophen **OR** acetaminophen, ALPRAZolam, fluticasone, guaiFENesin-dextromethorphan, HYDROcodone-acetaminophen, levalbuterol, ondansetron **OR** ondansetron (ZOFRAN) IV, polyvinyl alcohol, sodium chloride flush  Micro Results No results found for this or any previous visit (from the past 240 hour(s)).  Radiology  Reports Ct Abdomen Pelvis W Contrast  Result Date:  08/02/2017 CLINICAL DATA:  History of diverticulitis, completed antibiotic treatment, follow-up EXAM: CT ABDOMEN AND PELVIS WITH CONTRAST TECHNIQUE: Multidetector CT imaging of the abdomen and pelvis was performed using the standard protocol following bolus administration of intravenous contrast. CONTRAST:  129mL ISOVUE-300 IOPAMIDOL (ISOVUE-300) INJECTION 61% COMPARISON:  CT abdomen pelvis of 07/27/2017 FINDINGS: Lower chest: There is mild bronchiectatic change involving the right lower lobe. No pneumonia or effusion is seen. The heart is mildly enlarged. Hepatobiliary: The liver enhances and there is no change in the small low-attenuation structure inferiorly in the right lobe medially most consistent with benign process such as cyst. No other hepatic abnormality is seen. No calcified gallstones are noted. There is however slightly higher attenuation near the neck of the gallbladder and small gallstones or sludge would be a consideration. If further assessment is warranted, ultrasound is recommended. Pancreas: The pancreas is unremarkable. The pancreatic duct is not dilated. Spleen: The spleen is stable with no abnormality noted. Adrenals/Urinary Tract: The adrenal glands appear normal. The kidneys enhance with no mass or calculus. On delayed images, the pelvocaliceal systems are unremarkable. The ureters are normal in caliber. The urinary bladder is not well distended but no abnormality is seen. Stomach/Bowel: The stomach is not well distended but no abnormality is evident. No small bowel distention is seen. The pericolonic strandiness at the junction of the distal CT descending and proximal sigmoid colon has resolved most consistent with resolved mild uncomplicated diverticulitis. Multiple rectosigmoid colon diverticula again are noted with diverticula scattered throughout the more proximal colon as well. The terminal ileum and the appendix are unremarkable.  Vascular/Lymphatic: The abdominal aorta is normal in caliber with moderate abdominal aortic atherosclerosis present. No adenopathy is seen. Reproductive: The prostate gland is normal in size for age. Other: Fat enters both inguinal canals. Musculoskeletal: The lumbar vertebrae are in normal alignment. Intervertebral disc spaces appear normal. The SI joints appear corticated. IMPRESSION: 1. Resolution of the mild diverticulitis at the junction of the distal descending and proximal sigmoid colon. 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 2. Stable bronchiectatic change in the right lower lobe. 3. Cannot exclude gallbladder sludge or small noncalcified gallstones within the neck of the gallbladder. Consider ultrasound if necessary clinically. 4. Moderate abdominal aortic atherosclerosis. Electronically Signed   By: Ivar Drape M.D.   On: 08/02/2017 13:52   Dg Chest Portable 1 View  Result Date: 08/09/2017 CLINICAL DATA:  Worsening shortness of breath for a few days. Cough. History of atrial fibrillation, COPD, hypertension, emphysema, pneumonia, former smoker. EXAM: PORTABLE CHEST 1 VIEW COMPARISON:  02/19/2017 FINDINGS: Heart size and pulmonary vascularity are normal. Peribronchial thickening suggesting chronic bronchitis. Scarring in the lung apices and bases. No airspace disease or consolidation. No blunting of costophrenic angles. No pneumothorax. Calcification of the aorta. No change since prior study. IMPRESSION: Chronic bronchitic changes in the lungs. Apical and basilar scarring. No evidence of active pulmonary disease. Electronically Signed   By: Lucienne Capers M.D.   On: 08/09/2017 03:45   Ct Renal Stone Study  Result Date: 07/27/2017 CLINICAL DATA:  Left abdominal pain for 2 days. EXAM: CT ABDOMEN AND PELVIS WITHOUT CONTRAST TECHNIQUE: Multidetector CT imaging of the abdomen and pelvis was performed following the standard protocol without IV contrast. COMPARISON:  None. FINDINGS: Lower chest:  Bronchiectasis, peribronchial thickening in the right lower lobe. Airspace disease versus atelectasis in the right middle lobe, partially visualized. Probable atelectasis in the lingula. Hepatobiliary: Incompletely characterized 1.2 cm right hepatic hypoattenuated lesion. Normal gallbladder. Pancreas: Unremarkable. No  pancreatic ductal dilatation or surrounding inflammatory changes. Spleen: Normal in size without focal abnormality. Adrenals/Urinary Tract: Adrenal glands are unremarkable. Kidneys are normal, without renal calculi, focal lesion, or hydronephrosis. Nonspecific bilateral perinephric stranding. Bladder is unremarkable. Stomach/Bowel: Normal appearance of the stomach and small bowel. Normal appendix. Extensive left colonic diverticulosis. Long segment of circumferential mucosal thickening of the sigmoid colon. Focal area of pericolonic inflammatory changes at the junction of distal descending colon/proximal sigmoid colon with focal asymmetric mucosal thickening of the bowel, image 53/86, sequence 2, axial images and image 40/98, sequence 5, coronal images. Vascular/Lymphatic: Aortic atherosclerosis. No enlarged abdominal or pelvic lymph nodes. Reproductive: Prostate is unremarkable. Other: Bilateral fat containing inguinal hernias and small fat containing periumbilical anterior abdominal wall hernia. Musculoskeletal: No acute or significant osseous findings. IMPRESSION: Focal asymmetric mucosal thickening at the junction of the descending colon/proximal sigmoid colon with pericolonic inflammatory changes, on the background of extensive diverticulosis of the left colon and long segment of circumferential mucosal thickening of the sigmoid colon. These findings may represent acute on chronic diverticulitis. However primary colonic malignancy cannot be excluded. Correlation to colonoscopy, after resolution of the acute symptomatology may be considered. No evidence of colonic rupture or abscess formation. 12  mm incompletely characterized due to lack of IV contrast lesion within the right lobe of the liver. Further evaluation with contrast-enhanced CT or MRI of the abdomen may be considered. Right lower lobe bronchiectasis and peribronchial thickening, likely due to chronic bronchitis. Incompletely visualized atelectasis versus airspace consolidation in the right middle lobe. Electronically Signed   By: Fidela Salisbury M.D.   On: 07/27/2017 17:00    Time Spent in minutes  25   Iokepa Geffre M.D on 08/10/2017 at 12:22 PM  Between 7am to 7pm - Pager - [phone]  After 7pm go to [email or web address] - password [facility]  [facility] -  Office  [phone]

## 2017-08-11 DIAGNOSIS — J9601 Acute respiratory failure with hypoxia: Secondary | ICD-10-CM | POA: Diagnosis present

## 2017-08-11 DIAGNOSIS — D72829 Elevated white blood cell count, unspecified: Secondary | ICD-10-CM | POA: Diagnosis present

## 2017-08-11 DIAGNOSIS — I482 Chronic atrial fibrillation: Secondary | ICD-10-CM | POA: Diagnosis not present

## 2017-08-11 DIAGNOSIS — I10 Essential (primary) hypertension: Secondary | ICD-10-CM | POA: Diagnosis not present

## 2017-08-11 DIAGNOSIS — J441 Chronic obstructive pulmonary disease with (acute) exacerbation: Secondary | ICD-10-CM | POA: Diagnosis not present

## 2017-08-11 LAB — CBC
HCT: 40.5 % (ref 39.0–52.0)
Hemoglobin: 12.9 g/dL — ABNORMAL LOW (ref 13.0–17.0)
MCH: 29.4 pg (ref 26.0–34.0)
MCHC: 31.9 g/dL (ref 30.0–36.0)
MCV: 92.3 fL (ref 78.0–100.0)
Platelets: 379 10*3/uL (ref 150–400)
RBC: 4.39 MIL/uL (ref 4.22–5.81)
RDW: 13.9 % (ref 11.5–15.5)
WBC: 23.2 10*3/uL — ABNORMAL HIGH (ref 4.0–10.5)

## 2017-08-11 LAB — PROTIME-INR
INR: 2.05
Prothrombin Time: 22.9 seconds — ABNORMAL HIGH (ref 11.4–15.2)

## 2017-08-11 MED ORDER — DOXYCYCLINE HYCLATE 100 MG PO TABS: 100.0000 mg | ORAL_TABLET | Freq: Two times a day (BID) | ORAL | 0 refills | Status: DC

## 2017-08-11 MED ORDER — WARFARIN SODIUM 2 MG PO TABS: 2.0000 mg | ORAL_TABLET | Freq: Once | ORAL | Status: DC

## 2017-08-11 MED ORDER — PREDNISONE 20 MG PO TABS: 20.0000 mg | ORAL_TABLET | Freq: Every day | ORAL | 0 refills | Status: DC

## 2017-08-11 MED ORDER — GUAIFENESIN-DM 100-10 MG/5ML PO SYRP: 5.0000 mL | ORAL_SOLUTION | ORAL | 0 refills | Status: DC | PRN

## 2017-08-11 NOTE — Progress Notes (Signed)
Patient is to be discharged home and in stable condition. Patient given discharge instructions. Patient's IV and telemetry removed, WNL. Patient ambulated and oxygen saturations remained above 96%.  Celestia Khat, RN

## 2017-08-11 NOTE — Discharge Instructions (Signed)

## 2017-08-11 NOTE — Progress Notes (Signed)
    ANTICOAGULATION CONSULT NOTE -   Pharmacy Consult for coumadin Indication: atrial fibrillation  Allergies  Allergen Reactions  . Simvastatin Rash  . Albuterol Other (See Comments)    Increases heartrate!!!   . Augmentin [Amoxicillin-Pot Clavulanate] Rash  . Ciprofloxacin Rash  . Contrast Media [Iodinated Diagnostic Agents] Rash  . Corticosteroids Rash    Patient is unaware of this allergy  . Flagyl [Metronidazole] Rash  . Latex Hives and Rash  . Sulfa Antibiotics Rash    Patient Measurements: Height: 5\' 11"  (180.3 cm) Weight: 183 lb 10.3 oz (83.3 kg) IBW/kg (Calculated) : 75.3 HEPARIN DW (KG): 83.3   Vital Signs: Temp: 98.2 F (36.8 C) (02/02 0600) BP: 108/67 (02/02 0600) Pulse Rate: 86 (02/02 0600)  Labs: Recent Labs    08/09/17 0322 08/10/17 0527 08/11/17 0622  HGB 14.3 12.9* 12.9*  HCT 44.3 40.5 40.5  PLT 362 368 379  LABPROT 22.1* 27.2* 22.9*  INR 1.96 2.55 2.05  CREATININE 1.12  --   --   TROPONINI <0.03  --   --    Estimated Creatinine Clearance: 62.6 mL/min (by C-G formula based on SCr of 1.12 mg/dL).  Medical History: Past Medical History:  Diagnosis Date  . Allergy   . Anxiety   . Atrial fibrillation ([place])   . Bladder cancer ([place])    1996  . Bladder tumor   . Colon polyps   . COPD (chronic obstructive pulmonary disease) ([place])   . Emphysema/COPD ([place])   . GERD (gastroesophageal reflux disease)   . Hypertension   . Kidney stone   . Pneumonia   . Skin cancer     Medications:  Scheduled:  . doxycycline  100 mg Oral Q12H  . erythromycin   Left Eye Q12H  . hydrochlorothiazide  25 mg Oral Daily  . ipratropium  0.5 mg Nebulization Q6H  . loratadine  10 mg Oral Daily  . losartan  50 mg Oral Daily  . methylPREDNISolone (SOLU-MEDROL) injection  80 mg Intravenous Q12H  . metoprolol succinate  50 mg Oral BID  . mometasone-formoterol  2 puff Inhalation BID  . multivitamin with minerals  1 tablet Oral Daily  . pantoprazole  40 mg Oral  Daily  . sodium chloride flush  3 mL Intravenous Q12H  . Warfarin - Pharmacist Dosing Inpatient   Does not apply q1800    Assessment: 72 yr old male on coumadin for afib, INR=2.05. Admitted with COPD exacerbation.   Goal of Therapy:  INR 2-3   Plan:  Coumadin 2mg  today PT-INR daily Monitor for S/S of bleeding  Thanks for allowing pharmacy to be a part of this patient's care.  Excell Seltzer, PharmD Clinical Pharmacist

## 2017-08-11 NOTE — Discharge Summary (Signed)
Physician Discharge Summary  DEARIES MEIKLE DVV:[id number] DOB: 09-Sep-1945 DOA: 08/09/2017  PCP: [name], MD  Admit date: 08/09/2017 Discharge date: 08/11/2017  Admitted From: Home Disposition: Home  Recommendations for Outpatient Follow-up:  1. Follow up with PCP in 1-2 weeks.  please monitor INR and WBC in the next 3-4 days. 2. Patient will be discharged on oral prednisone taper over the next 12 days along with oral doxycycline for 5 more days.    Home Health: None Equipment/Devices: None  Discharge Condition: Fair CODE STATUS: Full code Diet recommendation: Low-sodium    Discharge Diagnoses:  Principal Problem:   COPD with acute exacerbation ([place])  Active Problems:   Essential hypertension   Atrial fibrillation, chronic (HCC)   Leucocytosis   Acute respiratory failure with hypoxia (HCC)  Brief narrative/HPI Please refer to admission H&P for details, in brief, 72 year old male with COPD, not on home O2, A. 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   Patient took [place] at home without much relief.  Patient found to be hypoxic in the ED and admitted for COPD exacerbation.  Reports that his wife was recently ill with URI symptoms.   Hospital course   Principal Problem:   COPD exacerbation ([place]) Acute respiratory failure with hypoxia (HCC) Given IV Solu-Medrol, scheduled and as needed nebulizer.  Placed on empiric doxycycline.  Symptoms much improved.  Will assess need for home O2.  Patient can be discharged home on oral prednisone taper over the next 12 days and 5-day course of oral doxycycline. Follow-up with PCP in 1  week.   Active Problems:   Essential hypertension Stable.  Continue home medications.  (HCTZ, Toprol and losartan)    Atrial fibrillation, chronic (HCC) Rate controlled.  Continue Toprol and Coumadin.  INR slowly elevated with doxycycline.    Monitor closely as outpatient  Leukocytosis (WBC >20 K) Secondary  to steroid. (Patient was on tapering dose of steroid as outpatient also).  No signs of active infection.  As outpatient.     Code Status : Full code  Family Communication  : Wife at bedside  Disposition Plan  : Home possibly tomorrow if breathing improves  Barriers For Discharge : Active symptoms  Consults  : None    Discharge Instructions   Allergies as of 08/11/2017      Reactions   Simvastatin Rash   Albuterol Other (See Comments)   Increases heartrate!!!    Augmentin [amoxicillin-pot Clavulanate] Rash   Ciprofloxacin Rash   Contrast Media [iodinated Diagnostic Agents] Rash   Corticosteroids Rash   Patient is unaware of this allergy   Flagyl [metronidazole] Rash   Latex Hives, Rash   Sulfa Antibiotics Rash      Medication List    TAKE these medications   albuterol 108 (90 Base) MCG/ACT inhaler Commonly known as:  PROVENTIL HFA;VENTOLIN HFA Inhale 2 puffs into the lungs every 6 (six) hours as needed for wheezing or shortness of breath.   ALPRAZolam 0.25 MG tablet Commonly known as:  XANAX Take 0.25 mg by mouth 2 (two) times daily as needed for anxiety or sleep.   budesonide-formoterol 160-4.5 MCG/ACT inhaler Commonly known as:  SYMBICORT Inhale 2 puffs into the lungs 2 (two) times daily.  cetirizine 5 MG tablet Commonly known as:  ZYRTEC Take 5 mg by mouth daily.   doxycycline 100 MG tablet Commonly known as:  VIBRA-TABS Take 1 tablet (100 mg total) by mouth every 12 (twelve) hours.   guaiFENesin-dextromethorphan 100-10 MG/5ML syrup Commonly known as:  ROBITUSSIN DM Take 5 mLs by mouth every 4 (four) hours as needed for cough.    hydroxypropyl methylcellulose / hypromellose 2.5 % ophthalmic solution Commonly known as:  ISOPTO TEARS / GONIOVISC Place 1 drop into both eyes as needed for dry eyes.   levalbuterol 0.63 MG/3ML nebulizer solution Commonly known as:  XOPENEX Take 3 mLs (0.63 mg total) by nebulization every 6 (six) hours as needed for wheezing or shortness of breath.   losartan-hydrochlorothiazide 50-12.5 MG tablet Commonly known as:  HYZAAR Take 1 tablet by mouth daily.   metoprolol succinate 50 MG 24 hr tablet Commonly known as:  TOPROL-XL Take 1 tablet (50 mg total) by mouth 2 (two) times daily.   multivitamin with minerals Tabs tablet Take 1 tablet by mouth daily.   omeprazole 40 MG capsule Commonly known as:  PRILOSEC Take 40 mg by mouth daily.   predniSONE 20 MG tablet Commonly known as:  DELTASONE Take 1 tablet (20 mg total) by mouth daily with breakfast. What changed:    medication strength  how much to take  how to take this  when to take this  additional instructions   triamcinolone 55 MCG/ACT Aero nasal inhaler Commonly known as:  NASACORT Place 1 spray into the nose daily as needed (Congestion).   warfarin 2.5 MG tablet Commonly known as:  COUMADIN Take 2.5 mg by mouth daily.      Follow-up Information    [name], MD. Schedule an appointment as soon as possible for a visit in 1 week(s).   Specialty:  Family Medicine Contact information: [address] [place] [address] [phone]          Allergies  Allergen Reactions  . Simvastatin Rash  . Albuterol Other (See Comments)    Increases heartrate!!!   . Augmentin [Amoxicillin-Pot Clavulanate] Rash  . Ciprofloxacin Rash  . Contrast Media [Iodinated Diagnostic Agents] Rash  . Corticosteroids Rash    Patient is unaware of this allergy  . Flagyl [Metronidazole] Rash  . Latex Hives and Rash  . Sulfa Antibiotics Rash        Procedures/Studies: Ct Abdomen Pelvis W Contrast  Result Date:  08/02/2017 CLINICAL DATA:  History of diverticulitis, completed antibiotic treatment, follow-up EXAM: CT ABDOMEN AND PELVIS WITH CONTRAST TECHNIQUE: Multidetector CT imaging of the abdomen and pelvis was performed using the standard protocol following bolus administration of intravenous contrast. CONTRAST:  169mL ISOVUE-300 IOPAMIDOL (ISOVUE-300) INJECTION 61% COMPARISON:  CT abdomen pelvis of 07/27/2017 FINDINGS: Lower chest: There is mild bronchiectatic change involving the right lower lobe. No pneumonia or effusion is seen. The heart is mildly enlarged. Hepatobiliary: The liver enhances and there is no change in the small low-attenuation structure inferiorly in the right lobe medially most consistent with benign process such as cyst. No other hepatic abnormality is seen. No calcified gallstones are noted. There is however slightly higher attenuation near the neck of the gallbladder and small gallstones or sludge would be a consideration. If further assessment is warranted, ultrasound is recommended. Pancreas: The pancreas is unremarkable. The pancreatic duct is not dilated. Spleen: The spleen is stable with no abnormality noted. Adrenals/Urinary Tract: The adrenal glands appear normal. The kidneys enhance with no mass or calculus.  On delayed images, the pelvocaliceal systems are unremarkable. The ureters are normal in caliber. The urinary bladder is not well distended but no abnormality is seen. Stomach/Bowel: The stomach is not well distended but no abnormality is evident. No small bowel distention is seen. The pericolonic strandiness at the junction of the distal CT descending and proximal sigmoid colon has resolved most consistent with resolved mild uncomplicated diverticulitis. Multiple rectosigmoid colon diverticula again are noted with diverticula scattered throughout the more proximal colon as well. The terminal ileum and the appendix are unremarkable. Vascular/Lymphatic: The abdominal aorta is normal in  caliber with moderate abdominal aortic atherosclerosis present. No adenopathy is seen. Reproductive: The prostate gland is normal in size for age. Other: Fat enters both inguinal canals. Musculoskeletal: The lumbar vertebrae are in normal alignment. Intervertebral disc spaces appear normal. The SI joints appear corticated. IMPRESSION: 1. Resolution of the mild diverticulitis at the junction of the distal descending and proximal sigmoid colon. 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 2. Stable bronchiectatic change in the right lower lobe. 3. Cannot exclude gallbladder sludge or small noncalcified gallstones within the neck of the gallbladder. Consider ultrasound if necessary clinically. 4. Moderate abdominal aortic atherosclerosis. Electronically Signed   By: Ivar Drape M.D.   On: 08/02/2017 13:52   Dg Chest Portable 1 View  Result Date: 08/09/2017 CLINICAL DATA:  Worsening shortness of breath for a few days. Cough. History of atrial fibrillation, COPD, hypertension, emphysema, pneumonia, former smoker. EXAM: PORTABLE CHEST 1 VIEW COMPARISON:  02/19/2017 FINDINGS: Heart size and pulmonary vascularity are normal. Peribronchial thickening suggesting chronic bronchitis. Scarring in the lung apices and bases. No airspace disease or consolidation. No blunting of costophrenic angles. No pneumothorax. Calcification of the aorta. No change since prior study. IMPRESSION: Chronic bronchitic changes in the lungs. Apical and basilar scarring. No evidence of active pulmonary disease. Electronically Signed   By: Lucienne Capers M.D.   On: 08/09/2017 03:45   Ct Renal Stone Study  Result Date: 07/27/2017 CLINICAL DATA:  Left abdominal pain for 2 days. EXAM: CT ABDOMEN AND PELVIS WITHOUT CONTRAST TECHNIQUE: Multidetector CT imaging of the abdomen and pelvis was performed following the standard protocol without IV contrast. COMPARISON:  None. FINDINGS: Lower chest: Bronchiectasis, peribronchial thickening in the right lower  lobe. Airspace disease versus atelectasis in the right middle lobe, partially visualized. Probable atelectasis in the lingula. Hepatobiliary: Incompletely characterized 1.2 cm right hepatic hypoattenuated lesion. Normal gallbladder. Pancreas: Unremarkable. No pancreatic ductal dilatation or surrounding inflammatory changes. Spleen: Normal in size without focal abnormality. Adrenals/Urinary Tract: Adrenal glands are unremarkable. Kidneys are normal, without renal calculi, focal lesion, or hydronephrosis. Nonspecific bilateral perinephric stranding. Bladder is unremarkable. Stomach/Bowel: Normal appearance of the stomach and small bowel. Normal appendix. Extensive left colonic diverticulosis. Long segment of circumferential mucosal thickening of the sigmoid colon. Focal area of pericolonic inflammatory changes at the junction of distal descending colon/proximal sigmoid colon with focal asymmetric mucosal thickening of the bowel, image 53/86, sequence 2, axial images and image 40/98, sequence 5, coronal images. Vascular/Lymphatic: Aortic atherosclerosis. No enlarged abdominal or pelvic lymph nodes. Reproductive: Prostate is unremarkable. Other: Bilateral fat containing inguinal hernias and small fat containing periumbilical anterior abdominal wall hernia. Musculoskeletal: No acute or significant osseous findings. IMPRESSION: Focal asymmetric mucosal thickening at the junction of the descending colon/proximal sigmoid colon with pericolonic inflammatory changes, on the background of extensive diverticulosis of the left colon and long segment of circumferential mucosal thickening of the sigmoid colon. These findings may represent acute on chronic diverticulitis.  However primary colonic malignancy cannot be excluded. Correlation to colonoscopy, after resolution of the acute symptomatology may be considered. No evidence of colonic rupture or abscess formation. 12 mm incompletely characterized due to lack of IV contrast  lesion within the right lobe of the liver. Further evaluation with contrast-enhanced CT or MRI of the abdomen may be considered. Right lower lobe bronchiectasis and peribronchial thickening, likely due to chronic bronchitis. Incompletely visualized atelectasis versus airspace consolidation in the right middle lobe. Electronically Signed   By: Fidela Salisbury M.D.   On: 07/27/2017 17:00       Subjective: Still has some cough but breathing much improved today.  Discharge Exam: Vitals:   08/11/17 0600 08/11/17 0734  BP: 108/67   Pulse: 86   Resp: 17   Temp: 98.2 F (36.8 C)   SpO2: 92% 93%   Vitals:   08/10/17 2118 08/11/17 0150 08/11/17 0600 08/11/17 0734  BP: (!) 105/57  108/67   Pulse: 80  86   Resp:   17   Temp:   98.2 F (36.8 C)   TempSrc:      SpO2: 95% 95% 92% 93%  Weight:      Height:        General: Elderly male not in distress HEENT: Moist mucosa, supple neck Chest: Improved bilateral breath sounds, no rhonchi or wheeze CVS: S1 and S2 irregular, no murmurs GI: Soft, nondistended, nontender Musculoskeletal: Warm, no edema     The results of significant diagnostics from this hospitalization (including imaging, microbiology, ancillary and laboratory) are listed below for reference.     Microbiology: No results found for this or any previous visit (from the past 240 hour(s)).   Labs: BNP (last 3 results) Recent Labs    08/09/17 0322  BNP 95.2   Basic Metabolic Panel: Recent Labs  Lab 08/09/17 0322  NA 134*  K 3.6  CL 91*  CO2 30  GLUCOSE 113*  BUN 23*  CREATININE 1.12  CALCIUM 9.1   Liver Function Tests: No results for input(s): AST, ALT, ALKPHOS, BILITOT, PROT, ALBUMIN in the last 168 hours. No results for input(s): LIPASE, AMYLASE in the last 168 hours. No results for input(s): AMMONIA in the last 168 hours. CBC: Recent Labs  Lab 08/09/17 0322 08/10/17 0527 08/11/17 0622  WBC 18.0* 22.1* 23.2*  HGB 14.3 12.9* 12.9*  HCT 44.3  40.5 40.5  MCV 93.9 92.0 92.3  PLT 362 368 379   Cardiac Enzymes: Recent Labs  Lab 08/09/17 0322  TROPONINI <0.03   BNP: Invalid input(s): POCBNP CBG: No results for input(s): GLUCAP in the last 168 hours. D-Dimer No results for input(s): DDIMER in the last 72 hours. Hgb A1c No results for input(s): HGBA1C in the last 72 hours. Lipid Profile No results for input(s): CHOL, HDL, LDLCALC, TRIG, CHOLHDL, LDLDIRECT in the last 72 hours. Thyroid function studies No results for input(s): TSH, T4TOTAL, T3FREE, THYROIDAB in the last 72 hours.  Invalid input(s): FREET3 Anemia work up No results for input(s): VITAMINB12, FOLATE, FERRITIN, TIBC, IRON, RETICCTPCT in the last 72 hours. Urinalysis    Component Value Date/Time   COLORURINE YELLOW 08/02/2017 0935   APPEARANCEUR CLEAR 08/02/2017 0935   LABSPEC 1.011 08/02/2017 0935   PHURINE 6.0 08/02/2017 0935   GLUCOSEU NEGATIVE 08/02/2017 0935   HGBUR NEGATIVE 08/02/2017 0935   BILIRUBINUR NEGATIVE 08/02/2017 0935   KETONESUR NEGATIVE 08/02/2017 0935   PROTEINUR NEGATIVE 08/02/2017 0935   NITRITE NEGATIVE 08/02/2017 0935   LEUKOCYTESUR TRACE (A)  08/02/2017 0935   Sepsis Labs Invalid input(s): PROCALCITONIN,  WBC,  LACTICIDVEN Microbiology No results found for this or any previous visit (from the past 240 hour(s)).   Time coordinating discharge: Over 30 minutes  SIGNED:   Louellen Molder, MD  [facility] 08/11/2017, 11:22 AM Pager   If 7PM-7AM, please contact night-coverage [email or web address] Password [email or web address]

## 2017-10-30 ENCOUNTER — Other Ambulatory Visit: Payer: Self-pay

## 2017-10-30 MED ORDER — LEVALBUTEROL HCL 0.63 MG/3ML IN NEBU: 0.6300 mg | INHALATION_SOLUTION | Freq: Four times a day (QID) | RESPIRATORY_TRACT | 6 refills | Status: DC | PRN

## 2017-11-09 ENCOUNTER — Telehealth: Payer: Self-pay | Admitting: Pulmonary Disease

## 2017-11-09 DIAGNOSIS — J438 Other emphysema: Secondary | ICD-10-CM

## 2017-11-09 NOTE — Telephone Encounter (Signed)
OK to refer -emphysema

## 2017-11-09 NOTE — Telephone Encounter (Signed)
Referral for pulm rehab placed.  Pt aware.  Nothing further needed.

## 2017-11-09 NOTE — Telephone Encounter (Signed)
Patient returned call. He was advised message has been sent to Dr. Elsworth Soho for approval.  He asked to be called back once Dr. Elsworth Soho responds, CB is [phone].

## 2017-11-09 NOTE — Telephone Encounter (Signed)
DR. Elsworth Soho can we place an order for pulmonary rehab?  Called pt to let him know I would get approval from Dr. Elsworth Soho.

## 2017-11-09 NOTE — Telephone Encounter (Signed)
RA please advise on pulm rehab order.  Thanks.

## 2017-11-25 ENCOUNTER — Emergency Department (HOSPITAL_COMMUNITY)
Admission: EM | Admit: 2017-11-25 | Discharge: 2017-11-25 | Disposition: A | Discharge status: Home / Self Care | Payer: Medicare Other | Attending: Emergency Medicine | Admitting: Emergency Medicine

## 2017-11-25 ENCOUNTER — Emergency Department (HOSPITAL_COMMUNITY): Payer: Medicare Other

## 2017-11-25 ENCOUNTER — Encounter (HOSPITAL_COMMUNITY): Payer: Self-pay | Admitting: Emergency Medicine

## 2017-11-25 ENCOUNTER — Other Ambulatory Visit: Payer: Self-pay

## 2017-11-25 DIAGNOSIS — R05 Cough: Secondary | ICD-10-CM | POA: Diagnosis present

## 2017-11-25 DIAGNOSIS — Z79899 Other long term (current) drug therapy: Secondary | ICD-10-CM | POA: Diagnosis not present

## 2017-11-25 DIAGNOSIS — Z8551 Personal history of malignant neoplasm of bladder: Secondary | ICD-10-CM | POA: Insufficient documentation

## 2017-11-25 DIAGNOSIS — Z7901 Long term (current) use of anticoagulants: Secondary | ICD-10-CM | POA: Diagnosis not present

## 2017-11-25 DIAGNOSIS — I10 Essential (primary) hypertension: Secondary | ICD-10-CM | POA: Insufficient documentation

## 2017-11-25 DIAGNOSIS — J441 Chronic obstructive pulmonary disease with (acute) exacerbation: Secondary | ICD-10-CM | POA: Diagnosis not present

## 2017-11-25 DIAGNOSIS — Z87891 Personal history of nicotine dependence: Secondary | ICD-10-CM | POA: Diagnosis not present

## 2017-11-25 DIAGNOSIS — Z91040 Latex allergy status: Secondary | ICD-10-CM | POA: Insufficient documentation

## 2017-11-25 DIAGNOSIS — Z85828 Personal history of other malignant neoplasm of skin: Secondary | ICD-10-CM | POA: Diagnosis not present

## 2017-11-25 MED ORDER — IPRATROPIUM BROMIDE 0.02 % IN SOLN
0.5000 mg | Freq: Once | RESPIRATORY_TRACT | Status: AC
Start: 2017-11-25 — End: 2017-11-25
  Administered 2017-11-25: 0.5 mg via RESPIRATORY_TRACT
  Filled 2017-11-25: qty 2.5

## 2017-11-25 MED ORDER — LEVALBUTEROL HCL 0.63 MG/3ML IN NEBU
0.6300 mg | INHALATION_SOLUTION | Freq: Once | RESPIRATORY_TRACT | Status: AC
  Administered 2017-11-25: 0.63 mg via RESPIRATORY_TRACT
  Filled 2017-11-25: qty 3

## 2017-11-25 MED ORDER — PREDNISONE 50 MG PO TABS
50.0000 mg | ORAL_TABLET | Freq: Once | ORAL | Status: AC
  Administered 2017-11-25: 50 mg via ORAL
  Filled 2017-11-25: qty 1

## 2017-11-25 MED ORDER — PREDNISONE 10 MG PO TABS: 10.0000 mg | ORAL_TABLET | Freq: Every day | ORAL | 0 refills | Status: DC

## 2017-11-25 MED ORDER — IPRATROPIUM BROMIDE 0.02 % IN SOLN
0.5000 mg | Freq: Once | RESPIRATORY_TRACT | Status: AC
  Administered 2017-11-25: 0.5 mg via RESPIRATORY_TRACT
  Filled 2017-11-25: qty 2.5

## 2017-11-25 NOTE — ED Provider Notes (Signed)
[facility] EMERGENCY DEPARTMENT Provider Note   CSN: [id number] Arrival date & time: 11/25/17  1093     History   Chief Complaint Chief Complaint  Patient presents with  . Cough    HPI Matthew Hall is a 72 y.o. male.  Known history of COPD presents with cough and congestion for 5 days.  He was started on cefdinir Friday by his primary care doctor.  He is also been using Xopenex nebulizers and Symbicort at home. Non-smoker.  Review of systems positive for green/gray/white sputum.  His symptoms is moderate.  Nothing makes symptoms better or worse     Past Medical History:  Diagnosis Date  . Allergy   . Anxiety   . Atrial fibrillation ([place])   . Bladder cancer ([place])    1996  . Bladder tumor   . Colon polyps   . COPD (chronic obstructive pulmonary disease) ([place])   . Emphysema/COPD ([place])   . GERD (gastroesophageal reflux disease)   . Hypertension   . Kidney stone   . Pneumonia   . Skin cancer     Patient Active Problem List   Diagnosis Date Noted  . Leucocytosis 08/11/2017  . Acute respiratory failure with hypoxia ([place]) 08/11/2017  . Elevated sed rate 05/21/2017  . COPD without exacerbation ([place]) 03/01/2017  . 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 Atrial fibrillation, chronic ([place]) 07/09/2016  . BRONCHIECTASIS W/O ACUTE EXACERBATION 03/31/2008  . Essential hypertension 09/13/2007  . Allergic rhinitis 09/13/2007  . EMPHYSEMA 09/13/2007  . COPD exacerbation ([place]) 09/13/2007    Past Surgical History:  Procedure Laterality Date  . bladder tumor removed  1996  . COLONOSCOPY    . exc skin cancee Left    2003  . Kidney stones removed  1996        Home Medications    Prior to Admission medications   Medication Sig Start Date End Date Taking? Authorizing Provider  albuterol (PROVENTIL HFA;VENTOLIN HFA) 108 (90 BASE) MCG/ACT inhaler Inhale 2 puffs into the lungs every 6 (six) hours as needed for wheezing or shortness of breath. 08/03/14   Rigoberto Noel, MD  ALPRAZolam Duanne Moron) 0.25 MG tablet Take 0.25 mg by mouth 2 (two) times daily as needed for anxiety or sleep.  07/01/14   [provider]  budesonide-formoterol (SYMBICORT) 160-4.5 MCG/ACT inhaler Inhale 2 puffs into the lungs 2 (two) times daily. 12/01/14   Rigoberto Noel, MD  cetirizine (ZYRTEC) 5 MG tablet Take 5 mg by mouth daily.    [provider]  doxycycline (VIBRA-TABS) 100 MG tablet Take 1 tablet (100 mg total) by mouth every 12 (twelve) hours. 08/11/17   Dhungel, Nishant, MD  guaiFENesin-dextromethorphan (ROBITUSSIN DM) 100-10 MG/5ML syrup Take 5 mLs by mouth every 4 (four) hours as needed for cough. 08/11/17   Dhungel, Flonnie Overman, MD  hydroxypropyl methylcellulose / hypromellose (ISOPTO TEARS / GONIOVISC) 2.5 % ophthalmic solution Place 1 drop into both eyes as needed for dry eyes.    [provider]  levalbuterol Penne Lash) 0.63 MG/3ML nebulizer solution Take 3 mLs (0.63 mg total) by nebulization every 6 (six) hours as needed for wheezing or shortness of breath. 10/30/17   Rigoberto Noel, MD  losartan-hydrochlorothiazide (HYZAAR) 50-12.5 MG tablet Take 1 tablet by mouth daily.    [provider]  metoprolol succinate (TOPROL-XL) 50 MG 24 hr tablet Take 1 tablet (50 mg total) by mouth 2 (two) times daily. 07/11/16   Thurnell Lose, MD  Multiple Vitamin (MULTIVITAMIN  WITH MINERALS) TABS tablet Take 1 tablet by mouth daily.    [provider]  omeprazole (PRILOSEC) 40 MG capsule Take 40 mg by mouth daily.    [provider]  predniSONE (DELTASONE) 10 MG tablet Take 1 tablet (10 mg total) by mouth daily with breakfast. 3 tablets daily for 4 days, 2 tablets daily for 4 days, 1 tablet daily for 4 days. 11/25/17   Nat Christen, MD  triamcinolone (NASACORT) 55 MCG/ACT AERO nasal inhaler Place 1 spray into the nose daily as needed (Congestion).     [provider]  warfarin (COUMADIN) 2.5 MG tablet Take 2.5 mg by mouth daily.     [provider]    Family History Family History  Problem Relation Age of Onset  . Emphysema Father   . Lung cancer Brother   . Liver cancer Brother   . Diabetes Mother   . Diabetes Sister   . Colon cancer Neg Hx     Social History Social History   Tobacco Use  . Smoking status: Former Smoker    Packs/day: 1.00    Years: 30.00    Pack years: 30.00    Types: Cigarettes    Last attempt to quit: 07/07/2011    Years since quitting: 6.3  . Smokeless tobacco: Never Used  Substance Use Topics  . Alcohol use: No    Alcohol/week: 0.0 oz  . Drug use: No     Allergies   Simvastatin; Albuterol; Augmentin [amoxicillin-pot clavulanate]; Ciprofloxacin; Contrast media [iodinated diagnostic agents]; Corticosteroids; Flagyl [metronidazole]; Latex; and Sulfa antibiotics   Review of Systems Review of Systems  All other systems reviewed and are negative.    Physical Exam Updated Vital Signs BP (!) 141/81   Pulse 93   Temp 98.5 F (36.9 C) (Oral)   Resp 18   Ht 5\' 11"  (1.803 m)   Wt 83 kg (183 lb)   SpO2 92%   BMI 25.52 kg/m   Physical Exam  Constitutional: He is oriented to person, place, and time.  No respiratory distress  HENT:  Head: Normocephalic and atraumatic.  Eyes: Conjunctivae are normal.  Neck: Neck supple.  Cardiovascular: Normal rate and regular rhythm.  Pulmonary/Chest: Effort normal.  Scattered rhonchi  Abdominal: Soft. Bowel sounds are normal.  Musculoskeletal: Normal range of motion.  Neurological: He is alert and oriented to person, place, and time.  Skin: Skin is warm and dry.  Psychiatric: He has a normal mood and affect. His behavior is normal.  Nursing note and vitals reviewed.    ED Treatments / Results  Labs (all labs ordered are listed, but only abnormal results are displayed) Labs Reviewed - No data to display  EKG None  Radiology Dg Chest 2 View  Result Date: 11/25/2017 CLINICAL DATA:  Cough and congestion 5 days.  EXAM: CHEST - 2 VIEW COMPARISON:  08/09/2017 and 02/19/2017 FINDINGS: Lungs are somewhat hyperexpanded without focal airspace consolidation or effusion. Stable patchy coarse interstitial changes. Mild stable cardiomegaly. Remainder of the exam is unchanged. IMPRESSION: No acute cardiopulmonary disease. Stable chronic patchy coarse interstitial changes. Mild stable cardiomegaly. Electronically Signed   By: Marin Olp M.D.   On: 11/25/2017 07:40    Procedures Procedures (including critical care time)  Medications Ordered in ED Medications  levalbuterol (XOPENEX) nebulizer solution 0.63 mg (0.63 mg Nebulization Given 11/25/17 0653)  ipratropium (ATROVENT) nebulizer solution 0.5 mg (0.5 mg Nebulization Given 11/25/17 0653)  levalbuterol (XOPENEX) nebulizer solution 0.63 mg (0.63 mg Nebulization Given 11/25/17  0838)  ipratropium (ATROVENT) nebulizer solution 0.5 mg (0.5 mg Nebulization Given 11/25/17 0838)  predniSONE (DELTASONE) tablet 50 mg (50 mg Oral Given 11/25/17 0831)     Initial Impression / Assessment and Plan / ED Course  I have reviewed the triage vital signs and the nursing notes.  Pertinent labs & imaging results that were available during my care of the patient were reviewed by me and considered in my medical decision making (see chart for details).     Patient with known history of COPD presents with exacerbation of cough and wheezing.  Chest x-ray shows no obvious pneumonia.  He was given nebulizer treatments of Xopenex/Atrovent.  Prednisone started.  He is hemodynamically stable at discharge.  He can take oral prednisone.  Discharge medication prednisone.  Final Clinical Impressions(s) / ED Diagnoses   Final diagnoses:  COPD exacerbation [facility])    ED Discharge Orders        Ordered    predniSONE (DELTASONE) 10 MG tablet  Daily with breakfast     11/25/17 0839       Nat Christen, MD 11/25/17 [phone]

## 2017-11-25 NOTE — ED Notes (Signed)
Patient transported to X-ray 

## 2017-11-25 NOTE — Discharge Instructions (Addendum)
Chest x-ray showed no obvious pneumonia.  Continue antibiotic and breathing treatments.  Prescription for prednisone.  Start this prescription tomorrow.

## 2017-11-25 NOTE — ED Notes (Signed)
EDP at bedside  

## 2017-11-25 NOTE — ED Triage Notes (Signed)
Pt c/o cough/congestion x 5 days. Pt states he started on antibiotics Friday ut feels he is getting worse. Reports productive cough with green sputum.

## 2017-11-25 NOTE — ED Notes (Signed)
RT notified for neb tx. 

## 2017-11-29 ENCOUNTER — Telehealth: Payer: Self-pay | Admitting: Pulmonary Disease

## 2017-11-29 DIAGNOSIS — J449 Chronic obstructive pulmonary disease, unspecified: Secondary | ICD-10-CM

## 2017-11-29 NOTE — Telephone Encounter (Signed)
Spoke with Diane at [facility]. She needed clarification on the diagnosis code that was attached to the pt's referral. Diagnosis attached was Emphysema but this diagnosis is not listed in the pt's chart. Diane wants to know if they could use COPD instead. Advised her yes but pt will need a pre and post spirometry.   Spoke with pt. He has been scheduled to come in tomorrow at 2pm to have this done. Nothing further was needed.

## 2017-11-30 ENCOUNTER — Ambulatory Visit (INDEPENDENT_AMBULATORY_CARE_PROVIDER_SITE_OTHER): Payer: Medicare Other | Admitting: Pulmonary Disease

## 2017-11-30 DIAGNOSIS — J449 Chronic obstructive pulmonary disease, unspecified: Secondary | ICD-10-CM | POA: Diagnosis not present

## 2017-11-30 LAB — PULMONARY FUNCTION TEST
FEF 25-75 Post: 0.45 L/sec
FEF 25-75 Pre: 0.35 L/sec
FEF2575-%Change-Post: 29 %
FEF2575-%Pred-Post: 18 %
FEF2575-%Pred-Pre: 14 %
FEV1-%Change-Post: 7 %
FEV1-%Pred-Post: 30 %
FEV1-%Pred-Pre: 28 %
FEV1-Post: 1.01 L
FEV1-Pre: 0.94 L
FEV1FVC-%Change-Post: 4 %
FEV1FVC-%Pred-Pre: 72 %
FEV6-%Change-Post: 1 %
FEV6-%Pred-Post: 40 %
FEV6-%Pred-Pre: 40 %
FEV6-Post: 1.75 L
FEV6-Pre: 1.72 L
FEV6FVC-%Change-Post: 0 %
FEV6FVC-%Pred-Post: 101 %
FEV6FVC-%Pred-Pre: 102 %
FVC-%Change-Post: 2 %
FVC-%Pred-Post: 40 %
FVC-%Pred-Pre: 39 %
FVC-Post: 1.83 L
FVC-Pre: 1.78 L
Post FEV1/FVC ratio: 55 %
Post FEV6/FVC ratio: 96 %
Pre FEV1/FVC ratio: 53 %
Pre FEV6/FVC Ratio: 97 %

## 2017-11-30 NOTE — Progress Notes (Signed)
PFT completed today.  

## 2017-12-24 ENCOUNTER — Encounter (HOSPITAL_COMMUNITY): Payer: Self-pay

## 2017-12-24 ENCOUNTER — Encounter (HOSPITAL_COMMUNITY)
Admission: RE | Admit: 2017-12-24 | Discharge: 2017-12-24 | Disposition: A | Discharge status: Home / Self Care | Payer: Medicare Other | Source: Ambulatory Visit | Attending: Pulmonary Disease | Admitting: Pulmonary Disease

## 2017-12-24 VITALS — BP 98/62 | HR 101 | Ht 71.0 in | Wt 177.2 lb

## 2017-12-24 DIAGNOSIS — Z87891 Personal history of nicotine dependence: Secondary | ICD-10-CM | POA: Insufficient documentation

## 2017-12-24 DIAGNOSIS — C679 Malignant neoplasm of bladder, unspecified: Secondary | ICD-10-CM | POA: Diagnosis not present

## 2017-12-24 DIAGNOSIS — Z7901 Long term (current) use of anticoagulants: Secondary | ICD-10-CM | POA: Diagnosis not present

## 2017-12-24 DIAGNOSIS — Z86010 Personal history of colonic polyps: Secondary | ICD-10-CM | POA: Diagnosis not present

## 2017-12-24 DIAGNOSIS — Z87442 Personal history of urinary calculi: Secondary | ICD-10-CM | POA: Diagnosis not present

## 2017-12-24 DIAGNOSIS — Z79899 Other long term (current) drug therapy: Secondary | ICD-10-CM | POA: Diagnosis not present

## 2017-12-24 DIAGNOSIS — Z7951 Long term (current) use of inhaled steroids: Secondary | ICD-10-CM | POA: Insufficient documentation

## 2017-12-24 DIAGNOSIS — Z85828 Personal history of other malignant neoplasm of skin: Secondary | ICD-10-CM | POA: Diagnosis not present

## 2017-12-24 DIAGNOSIS — K219 Gastro-esophageal reflux disease without esophagitis: Secondary | ICD-10-CM | POA: Insufficient documentation

## 2017-12-24 DIAGNOSIS — F419 Anxiety disorder, unspecified: Secondary | ICD-10-CM | POA: Insufficient documentation

## 2017-12-24 DIAGNOSIS — I10 Essential (primary) hypertension: Secondary | ICD-10-CM | POA: Diagnosis not present

## 2017-12-24 DIAGNOSIS — I4891 Unspecified atrial fibrillation: Secondary | ICD-10-CM | POA: Diagnosis not present

## 2017-12-24 DIAGNOSIS — J439 Emphysema, unspecified: Secondary | ICD-10-CM | POA: Insufficient documentation

## 2017-12-24 DIAGNOSIS — J449 Chronic obstructive pulmonary disease, unspecified: Secondary | ICD-10-CM | POA: Diagnosis present

## 2017-12-24 NOTE — Progress Notes (Signed)
Daily Session Note  Patient Details  Name: Matthew Hall MRN: [id number] Date of Birth: 1946-04-21 Referring Provider:     PULMONARY REHAB COPD ORIENTATION from 12/24/2017 in [place]  Referring Provider  Dr. Elsworth Soho      Encounter Date: 12/24/2017  Check In: Session Check In - 12/24/17 1230      Check-In   Location  [facility]    Staff Present  Darick Fetters Angelina Pih, MS, EP, [facility], Exercise Physiologist;Gregory Luther Parody, BS, EP, Exercise Physiologist;Debra Wynetta Emery, RN, BSN    Supervising physician immediately available to respond to emergencies  See telemetry face sheet for immediately available MD    Medication changes reported      No    Fall or balance concerns reported     No    Tobacco Cessation  -- Quit 07/07/2011    Warm-up and Cool-down  Performed as group-led instruction    Resistance Training Performed  Yes    VAD Patient?  No      Pain Assessment   Currently in Pain?  No/denies    Pain Score  0-No pain    Multiple Pain Sites  No       Capillary Blood Glucose: No results found for this or any previous visit (from the past 24 hour(s)).    Social History   Tobacco Use  Smoking Status Former Smoker  . Packs/day: 1.00  . Years: 30.00  . Pack years: 30.00  . Types: Cigarettes  . Last attempt to quit: 07/07/2011  . Years since quitting: 6.4  Smokeless Tobacco Never Used    Goals Met:  Proper associated with RPD/PD & O2 Sat Independence with exercise equipment Exercise tolerated well Personal goals reviewed No report of cardiac concerns or symptoms Strength training completed today  Goals Unmet:  Not Applicable  Comments: Check out: 14:30   Dr. Sinda Du is Medical Director for [facility].

## 2017-12-24 NOTE — Progress Notes (Signed)
Pulmonary Individual Treatment Plan  Patient Details  Name: Matthew Hall MRN: [id number] Date of Birth: 1946/03/29 Referring Provider:     PULMONARY REHAB COPD ORIENTATION from 12/24/2017 in [place]  Referring Provider  Dr. Elsworth Soho      Initial Encounter Date:    PULMONARY REHAB COPD ORIENTATION from 12/24/2017 in [place]  Date  12/24/17  Referring Provider  Dr. Elsworth Soho      Visit Diagnosis: Chronic obstructive pulmonary disease, unspecified COPD type ([place])  Patient's Home Medications on Admission:   Current Outpatient Medications:  .  albuterol (PROVENTIL HFA;VENTOLIN HFA) 108 (90 BASE) MCG/ACT inhaler, Inhale 2 puffs into the lungs every 6 (six) hours as needed for wheezing or shortness of breath., Disp: 1 Inhaler, Rfl: 6 .  ALPRAZolam (XANAX) 0.25 MG tablet, Take 0.25 mg by mouth 2 (two) times daily as needed for anxiety or sleep. , Disp: , Rfl: 3 .  budesonide-formoterol (SYMBICORT) 160-4.5 MCG/ACT inhaler, Inhale 2 puffs into the lungs 2 (two) times daily., Disp: 1 Inhaler, Rfl: 6 .  cetirizine (ZYRTEC) 5 MG tablet, Take 5 mg by mouth daily., Disp: , Rfl:  .  D3-50 50000 units capsule, Take 50,000 Units by mouth once a week. Usually Tuesday or Wednesday., Disp: , Rfl: 0 .  erythromycin ophthalmic ointment, Place 1 application into both eyes daily as needed (Infection)., Disp: , Rfl:  .  hydroxypropyl methylcellulose / hypromellose (ISOPTO TEARS / GONIOVISC) 2.5 % ophthalmic solution, Place 1 drop into both eyes as needed for dry eyes., Disp: , Rfl:  .  levalbuterol (XOPENEX) 0.63 MG/3ML nebulizer solution, Take 3 mLs (0.63 mg total) by nebulization every 6 (six) hours as needed for wheezing or shortness of breath., Disp: 75 mL, Rfl: 6 .  losartan-hydrochlorothiazide (HYZAAR) 50-12.5 MG tablet, Take 0.5 tablets by mouth 2 (two) times daily. , Disp: , Rfl:  .  metoprolol succinate (TOPROL-XL) 50 MG 24 hr tablet, Take 1 tablet (50 mg  total) by mouth 2 (two) times daily., Disp: 60 tablet, Rfl: 0 .  Multiple Vitamin (MULTIVITAMIN WITH MINERALS) TABS tablet, Take 1 tablet by mouth daily., Disp: , Rfl:  .  omeprazole (PRILOSEC) 40 MG capsule, Take 40 mg by mouth daily., Disp: , Rfl:  .  triamcinolone (NASACORT) 55 MCG/ACT AERO nasal inhaler, Place 1 spray into the nose daily as needed (Congestion). , Disp: , Rfl:  .  warfarin (COUMADIN) 2.5 MG tablet, Take 2.5 mg by mouth daily., Disp: , Rfl:   Past Medical History: Past Medical History:  Diagnosis Date  . Allergy   . Anxiety   . Atrial fibrillation ([place])   . Bladder cancer ([place])    1996  . Bladder tumor   . Colon polyps   . COPD (chronic obstructive pulmonary disease) ([place])   . Emphysema/COPD ([place])   . GERD (gastroesophageal reflux disease)   . Hypertension   . Kidney stone   . Pneumonia   . Skin cancer     Tobacco Use: Social History   Tobacco Use  Smoking Status Former Smoker  . Packs/day: 1.00  . Years: 30.00  . Pack years: 30.00  . Types: Cigarettes  . Last attempt to quit: 07/07/2011  . Years since quitting: 6.4  Smokeless Tobacco Never Used    Labs: Recent Review Scientist, physiological    Labs for ITP Cardiac and Pulmonary Rehab Latest Ref Rng & Units 07/24/2015 07/09/2016 08/09/2017   PHART 7.350 - 7.450 - - 7.459(H)   PXT0GYI  32.0 - 48.0 mmHg - - 42.8   HCO3 20.0 - 28.0 mmol/L - - 29.5(H)   TCO2 0 - 100 mmol/L 26 27 -   O2SAT % - - 95.7      Capillary Blood Glucose: No results found for: GLUCAP   Pulmonary Assessment Scores: Pulmonary Assessment Scores    Row Name 12/24/17 1410         ADL UCSD   ADL Phase  Entry     SOB Score total  21     Rest  0     Walk  4     Stairs  3     Bath  0     Dress  0     Shop  2       CAT Score   CAT Score  13       mMRC Score   mMRC Score  2        Pulmonary Function Assessment: Pulmonary Function Assessment - 12/24/17 1400      Pulmonary Function Tests   FVC%  1.79 %    FEV1%  4.54 %      FEV1/FVC Ratio  53    RV%  73 %      Initial Spirometry Results   FVC%  1.83 %    FEV1%  40 %    FEV1/FVC Ratio  55      Post Bronchodilator Spirometry Results   FVC%  1.83 %    FEV1%  40 %      Breath   Bilateral Breath Sounds  Clear    Shortness of Breath  Yes       Exercise Target Goals: Date: 12/24/17  Exercise Program Goal: Individual exercise prescription set using results from initial 6 min walk test and THRR while considering  patient's activity barriers and safety.   Exercise Prescription Goal: Initial exercise prescription builds to 30-45 minutes a day of aerobic activity, 2-3 days per week.  Home exercise guidelines will be given to patient during program as part of exercise prescription that the participant will acknowledge.  Activity Barriers & Risk Stratification: Activity Barriers & Cardiac Risk Stratification - 12/24/17 1417      Activity Barriers & Cardiac Risk Stratification   Activity Barriers  Shortness of Breath    Cardiac Risk Stratification  Low       6 Minute Walk: 6 Minute Walk    Row Name 12/24/17 1415         6 Minute Walk   Phase  Initial     Distance  1250 feet     Distance % Change  0 %     Distance Feet Change  0 ft     Walk Time  6 minutes     # of Rest Breaks  0     MPH  2.36     METS  2.81     RPE  13     Perceived Dyspnea   11     VO2 Peak  10.17     Symptoms  Yes (comment)     Comments  Patient states that he felt a "funny" feeling in his head during walk test. Feeling went away after rest     Resting HR  101 bpm     Resting BP  98/62     Resting Oxygen Saturation   92 %     Exercise Oxygen Saturation  during 6 min walk  87 %  Max Ex. HR  112 bpm     Max Ex. BP  118/64     2 Minute Post BP  104/60        Oxygen Initial Assessment: Oxygen Initial Assessment - 12/24/17 1412      Home Oxygen   Home Oxygen Device  None    Sleep Oxygen Prescription  None    Home Exercise Oxygen Prescription  None    Home at  Rest Exercise Oxygen Prescription  None    Compliance with Home Oxygen Use  -- N/A      Initial 6 min Walk   Oxygen Used  None      Program Oxygen Prescription   Program Oxygen Prescription  None       Oxygen Re-Evaluation:   Oxygen Discharge (Final Oxygen Re-Evaluation):   Initial Exercise Prescription: Initial Exercise Prescription - 12/24/17 1400      Date of Initial Exercise RX and Referring Provider   Date  12/24/17    Referring Provider  Dr. Elsworth Soho      Treadmill   MPH  1.3    Grade  0    Minutes  15    METs  1.9      NuStep   Level  1    SPM  70    Minutes  20    METs  2.1      Prescription Details   Frequency (times per week)  2    Duration  Progress to 30 minutes of continuous aerobic without signs/symptoms of physical distress      Intensity   THRR 40-80% of Max Heartrate  [phone]    Ratings of Perceived Exertion  11-13    Perceived Dyspnea  0-4      Progression   Progression  Continue progressive overload as per policy without signs/symptoms or physical distress.      Resistance Training   Training Prescription  Yes    Weight  1    Reps  10-15       Perform Capillary Blood Glucose checks as needed.  Exercise Prescription Changes:   Exercise Comments:   Exercise Goals and Review:  Exercise Goals    Row Name 12/24/17 1418             Exercise Goals   Increase Physical Activity  Yes       Intervention  Provide advice, education, support and counseling about physical activity/exercise needs.;Develop an individualized exercise prescription for aerobic and resistive training based on initial evaluation findings, risk stratification, comorbidities and participant's personal goals.       Expected Outcomes  Short Term: Attend rehab on a regular basis to increase amount of physical activity.       Increase Strength and Stamina  Yes       Intervention  Provide advice, education, support and counseling about physical activity/exercise  needs.;Develop an individualized exercise prescription for aerobic and resistive training based on initial evaluation findings, risk stratification, comorbidities and participant's personal goals.       Expected Outcomes  Short Term: Increase workloads from initial exercise prescription for resistance, speed, and METs.       Able to understand and use rate of perceived exertion (RPE) scale  Yes       Intervention  Provide education and explanation on how to use RPE scale       Expected Outcomes  Long Term:  Able to use RPE to guide intensity level when exercising independently;Short Term: Able  to use RPE daily in rehab to express subjective intensity level       Able to understand and use Dyspnea scale  Yes       Intervention  Provide education and explanation on how to use Dyspnea scale       Expected Outcomes  Short Term: Able to use Dyspnea scale daily in rehab to express subjective sense of shortness of breath during exertion;Long Term: Able to use Dyspnea scale to guide intensity level when exercising independently       Knowledge and understanding of Target Heart Rate Range (THRR)  Yes       Intervention  Provide education and explanation of THRR including how the numbers were predicted and where they are located for reference       Expected Outcomes  Short Term: Able to state/look up THRR;Long Term: Able to use THRR to govern intensity when exercising independently;Short Term: Able to use daily as guideline for intensity in rehab       Able to check pulse independently  Yes       Intervention  Provide education and demonstration on how to check pulse in carotid and radial arteries.;Review the importance of being able to check your own pulse for safety during independent exercise       Expected Outcomes  Short Term: Able to explain why pulse checking is important during independent exercise;Long Term: Able to check pulse independently and accurately       Understanding of Exercise Prescription   Yes       Intervention  Provide education, explanation, and written materials on patient's individual exercise prescription       Expected Outcomes  Short Term: Able to explain program exercise prescription;Long Term: Able to explain home exercise prescription to exercise independently          Exercise Goals Re-Evaluation :   Discharge Exercise Prescription (Final Exercise Prescription Changes):   Nutrition:  Target Goals: Understanding of nutrition guidelines, daily intake of sodium 1500mg , cholesterol 200mg , calories 30% from fat and 7% or less from saturated fats, daily to have 5 or more servings of fruits and vegetables.  Biometrics: Pre Biometrics - 12/24/17 1419      Pre Biometrics   Height  5\' 11"  (1.803 m)    Weight  177 lb 3.2 oz (80.4 kg)    Waist Circumference  40 inches    Hip Circumference  37 inches    Waist to Hip Ratio  1.08 %    BMI (Calculated)  24.73    Triceps Skinfold  16 mm    % Body Fat  26.7 %    Grip Strength  79.33 kg    Flexibility  0 in    Single Leg Stand  3 seconds        Nutrition Therapy Plan and Nutrition Goals: Nutrition Therapy & Goals - 12/24/17 1442      Personal Nutrition Goals   Personal Goal #2  Patient is eating a regular diet. We discussed ways to make changes. Handouts given.     Additional Goals?  No       Nutrition Assessments: Nutrition Assessments - 12/24/17 1443      MEDFICTS Scores   Pre Score  48       Nutrition Goals Re-Evaluation:   Nutrition Goals Discharge (Final Nutrition Goals Re-Evaluation):   Psychosocial: Target Goals: Acknowledge presence or absence of significant depression and/or stress, maximize coping skills, provide positive support system. Participant is able to  verbalize types and ability to use techniques and skills needed for reducing stress and depression.  Initial Review & Psychosocial Screening: Initial Psych Review & Screening - 12/24/17 1445      Initial Review   Current issues  with  None Identified      Family Dynamics   Good Support System?  Yes      Barriers   Psychosocial barriers to participate in program  There are no identifiable barriers or psychosocial needs.      Screening Interventions   Interventions  Encouraged to exercise    Expected Outcomes  Short Term goal: Identification and review with participant of any Quality of Life or Depression concerns found by scoring the questionnaire.;Long Term goal: The participant improves quality of Life and PHQ9 Scores as seen by post scores and/or verbalization of changes       Quality of Life Scores: Quality of Life - 12/24/17 1419      Quality of Life Scores   Health/Function Pre  16.16 %    Socioeconomic Pre  19.67 %    Psych/Spiritual Pre  24.64 %    Family Pre  14.5 %    GLOBAL Pre  18.28 %      Scores of 19 and below usually indicate a poorer quality of life in these areas.  A difference of  2-3 points is a clinically meaningful difference.  A difference of 2-3 points in the total score of the Quality of Life Index has been associated with significant improvement in overall quality of life, self-image, physical symptoms, and general health in studies assessing change in quality of life.   PHQ-9: Recent Review Flowsheet Data    Depression screen [facility] 2/9 12/24/2017 05/21/2017 03/06/2017 02/20/2017 08/06/2014   Decreased Interest 0 0 0 0 0   Down, Depressed, Hopeless 0 0 0 0 0   PHQ - 2 Score 0 0 0 0 0   Altered sleeping 1 - - 1  -   Tired, decreased energy 2 - - 1  -   Change in appetite 2 - - 1  -   Feeling bad or failure about yourself  0 - - 0 -   Trouble concentrating 0 - - 0 -   Moving slowly or fidgety/restless 0 - - 0 -   Suicidal thoughts 0 - - 0 -   PHQ-9 Score 5 - - 3 -   Difficult doing work/chores Somewhat difficult - - Somewhat difficult -     Interpretation of Total Score  Total Score Depression Severity:  1-4 = Minimal depression, 5-9 = Mild depression, 10-14 = Moderate  depression, 15-19 = Moderately severe depression, 20-27 = Severe depression   Psychosocial Evaluation and Intervention: Psychosocial Evaluation - 12/24/17 1445      Psychosocial Evaluation & Interventions   Interventions  Encouraged to exercise with the program and follow exercise prescription    Continue Psychosocial Services   No Follow up required       Psychosocial Re-Evaluation:   Psychosocial Discharge (Final Psychosocial Re-Evaluation):    Education: Education Goals: Education classes will be provided on a weekly basis, covering required topics. Participant will state understanding/return demonstration of topics presented.  Learning Barriers/Preferences: Learning Barriers/Preferences - 12/24/17 1341      Learning Barriers/Preferences   Learning Barriers  None    Learning Preferences  Verbal Instruction;Skilled Demonstration;Group Instruction       Education Topics: How Lungs Work and Diseases: - Discuss the anatomy of the lungs and  diseases that can affect the lungs, such as COPD.   Exercise: -Discuss the importance of exercise, FITT principles of exercise, normal and abnormal responses to exercise, and how to exercise safely.   Environmental Irritants: -Discuss types of environmental irritants and how to limit exposure to environmental irritants.   Meds/Inhalers and oxygen: - Discuss respiratory medications, definition of an inhaler and oxygen, and the proper way to use an inhaler and oxygen.   Energy Saving Techniques: - Discuss methods to conserve energy and decrease shortness of breath when performing activities of daily living.    Bronchial Hygiene / Breathing Techniques: - Discuss breathing mechanics, pursed-lip breathing technique,  proper posture, effective ways to clear airways, and other functional breathing techniques   Cleaning Equipment: - Provides group verbal and written instruction about the health risks of elevated stress, cause of high  stress, and healthy ways to reduce stress.   Nutrition I: Fats: - Discuss the types of cholesterol, what cholesterol does to the body, and how cholesterol levels can be controlled.   Nutrition II: Labels: -Discuss the different components of food labels and how to read food labels.   Respiratory Infections: - Discuss the signs and symptoms of respiratory infections, ways to prevent respiratory infections, and the importance of seeking medical treatment when having a respiratory infection.   Stress I: Signs and Symptoms: - Discuss the causes of stress, how stress may lead to anxiety and depression, and ways to limit stress.   Stress II: Relaxation: -Discuss relaxation techniques to limit stress.   Oxygen for Home/Travel: - Discuss how to prepare for travel when on oxygen and proper ways to transport and store oxygen to ensure safety.   Knowledge Questionnaire Score: Knowledge Questionnaire Score - 12/24/17 1342      Knowledge Questionnaire Score   Pre Score  16/18       Core Components/Risk Factors/Patient Goals at Admission: Personal Goals and Risk Factors at Admission - 12/24/17 1444      Core Components/Risk Factors/Patient Goals on Admission    Weight Management  Weight Maintenance    Personal Goal Other  Yes    Personal Goal  Build strength, breathe better    Intervention  Attend PR 2 x week and supplement at home exercise 3 x week.     Expected Outcomes  Achieve personal goals.        Core Components/Risk Factors/Patient Goals Review:    Core Components/Risk Factors/Patient Goals at Discharge (Final Review):    ITP Comments: ITP Comments    Row Name 12/24/17 1355           ITP Comments  Matthew Hall has been in the program before.           Comments: Patient arrived for 1st visit/orientation/education at 1230. Patient was referred to [facility] by Dr. Elsworth Soho due to COPD (J44.9).  During orientation advised patient on arrival and appointment times what to  wear, what to do before, during and after exercise. Reviewed attendance and class policy. Talked about inclement weather and class consultation policy. Pt is scheduled to return Pulmonary Rehab on 01/01/18 at 1045. Pt was advised to come to class 15 minutes before class starts. Patient was also given instructions on meeting with the dietician and attending the Family Structure classes. Discussed RPE/Dpysnea scales. Discussed initial THR and how to find their radial and/or carotid pulse. Discussed the initial exercise prescription and how this effects their progress. Pt is eager to get started. Patient participated in warm up stretches  followed by light weights and resistance bands. Patient was able to complete 6 minute walk test. Patient did not c/o pain. He said he felt a little funny in his head (pressure). The feeling in his head subsided at 2 minute rest. Feeling in his head was completely gone at the end of orientation. Patient was measured for the equipment. Discussed equipment safety with patient. Took patient pre-anthropometric measurements. Patient finished visit at 1430.

## 2017-12-24 NOTE — Progress Notes (Signed)
Cardiac/Pulmonary Rehab Medication Review by a Pharmacist  Does the patient  feel that his/her medications are working for him/her?  Yes, but blood pressure may be too low.  Has the patient been experiencing any side effects to the medications prescribed?  no  Does the patient measure his/her own blood pressure or blood glucose at home?  yes , blood pressure  Does the patient have any problems obtaining medications due to transportation or finances?   Yes, but Xopenex is able to get free in [place].  Understanding of regimen: good Understanding of indications: good Potential of compliance: good  Pharmacist comments: None  Pricilla Larsson 12/24/2017 2:12 PM

## 2017-12-30 ENCOUNTER — Encounter: Payer: Self-pay | Admitting: Cardiology

## 2017-12-30 NOTE — Progress Notes (Signed)
Cardiology Office Note  Date: 12/31/2017   ID: Matthew Hall, DOB 02-18-1946, MRN [id number]  PCP: [name], MD  Consulting cardiologist: Rozann Lesches, MD   Chief Complaint  Patient presents with  . Atrial Fibrillation    History of Present Illness: Matthew Hall is a 72 y.o. male referred for cardiology consultation by Dr. Macarthur Critchley for assessment of atrial fibrillation.  He reports a 20-year history of atrial fibrillation, was on Coumadin approximately 15 years ago but decided to stop it and was transiently on aspirin.  He has been managed with a strategy of heart rate control over the years, tolerating Toprol-XL.  He states that the dose of Toprol-XL was increased about a year ago.  He does have fatigue and chronic dyspnea on exertion, although in the setting of chronic lung disease.  Records indicate long-term cardiology follow-up with Dr. Rosalita Chessman in [place].  I reviewed the available records. CHADSVASC score is 3 and he has been on Coumadin for stroke prophylaxis with follow-up per PCP over the last year, states that his INR levels have typically been in range.  He tells me that he is reluctant to consider transition to [place], Eliquis was discussed with him in the past.  We discussed the general comparison, pros and cons of DOAC versus Coumadin.  He states that he is comfortable remaining on Coumadin at this time.  He does not report any major spontaneous bleeding problems.  He plans to start cardiac/pulmonary rehabilitation at [facility], would like to increase his stamina.  He does not describe exertional chest pain.  No palpitations or syncope.  He does state that his blood pressure has occasionally been low when he checks it.  He tells me that his wife has also been sick with chronic illnesses and that this is been causing a lot of stress for him as primary caregiver.  I reviewed his last echocardiogram and ECG, both outlined below.   Past Medical History:    Diagnosis Date  . Anxiety   . Aortic atherosclerosis ([place])   . Atrial fibrillation ([employer])   . Basal cell carcinoma   . Bladder cancer ([place])   . Bladder tumor   . Colon polyps   . COPD (chronic obstructive pulmonary disease) ([place])   . Cutaneous vasculitis   . Diverticulosis   . Essential hypertension   . GERD (gastroesophageal reflux disease)   . History of pneumonia   . Nephrolithiasis   . Osteoarthritis     Past Surgical History:  Procedure Laterality Date  . COLONOSCOPY    . Kidney stones removed  1996  . SKIN CANCER EXCISION Left    2003  . TRANSURETHRAL RESECTION OF BLADDER TUMOR  1996    Current Outpatient Medications  Medication Sig Dispense Refill  . albuterol (PROVENTIL HFA;VENTOLIN HFA) 108 (90 BASE) MCG/ACT inhaler Inhale 2 puffs into the lungs every 6 (six) hours as needed for wheezing or shortness of breath. 1 Inhaler 6  . ALPRAZolam (XANAX) 0.25 MG tablet Take 0.25 mg by mouth 2 (two) times daily as needed for anxiety or sleep.   3  . budesonide-formoterol (SYMBICORT) 160-4.5 MCG/ACT inhaler Inhale 2 puffs into the lungs 2 (two) times daily. 1 Inhaler 6  . cetirizine (ZYRTEC) 5 MG tablet Take 5 mg by mouth daily.    . D3-50 50000 units capsule Take 50,000 Units by mouth once a week. Usually Tuesday or Wednesday.  0  . erythromycin ophthalmic ointment Place 1 application into both eyes  daily as needed (Infection).    . hydroxypropyl methylcellulose / hypromellose (ISOPTO TEARS / GONIOVISC) 2.5 % ophthalmic solution Place 1 drop into both eyes as needed for dry eyes.    Marland Kitchen levalbuterol (XOPENEX) 0.63 MG/3ML nebulizer solution Take 3 mLs (0.63 mg total) by nebulization every 6 (six) hours as needed for wheezing or shortness of breath. 75 mL 6  . losartan-hydrochlorothiazide (HYZAAR) 50-12.5 MG tablet Take 0.5 tablets by mouth 2 (two) times daily.     . metoprolol succinate (TOPROL-XL) 50 MG 24 hr tablet Take 1 tablet (50 mg total) by mouth 2 (two) times daily. 60  tablet 0  . Multiple Vitamin (MULTIVITAMIN WITH MINERALS) TABS tablet Take 1 tablet by mouth daily.    Marland Kitchen omeprazole (PRILOSEC) 40 MG capsule Take 40 mg by mouth daily.    Marland Kitchen triamcinolone (NASACORT) 55 MCG/ACT AERO nasal inhaler Place 1 spray into the nose daily as needed (Congestion).     . warfarin (COUMADIN) 2.5 MG tablet Take 2.5 mg by mouth daily.     No current facility-administered medications for this visit.    Allergies:  Simvastatin; Albuterol; Augmentin [amoxicillin-pot clavulanate]; Ciprofloxacin; Contrast media [iodinated diagnostic agents]; Flagyl [metronidazole]; Latex; and Sulfa antibiotics   Social History: The patient  reports that he quit smoking about 6 years ago. His smoking use included cigarettes. He has a 30.00 pack-year smoking history. He has never used smokeless tobacco. He reports that he does not drink alcohol or use drugs.   Family History: The patient's family history includes CAD in his mother; Diabetes in his mother and sister; Emphysema in his father; Liver cancer in his brother; Lung cancer in his brother; Stroke in his sister.   ROS:  Please see the history of present illness. Otherwise, complete review of systems is positive for hearing loss, chronic dyspnea on exertion at NYHA class II-III.  All other systems are reviewed and negative.   Physical Exam: VS:  BP 113/66   Pulse 64   Ht 5\' 11"  (1.803 m)   Wt 178 lb 12.8 oz (81.1 kg)   SpO2 96%   BMI 24.94 kg/m , BMI Body mass index is 24.94 kg/m.  Wt Readings from Last 3 Encounters:  12/31/17 178 lb 12.8 oz (81.1 kg)  12/24/17 177 lb 3.2 oz (80.4 kg)  11/25/17 183 lb (83 kg)    General: Patient appears comfortable at rest. HEENT: Conjunctiva and lids normal, oropharynx clear. Neck: Supple, no elevated JVP or carotid bruits, no thyromegaly. Lungs: Decreased breath sounds without wheezing, nonlabored breathing at rest. Cardiac: Irregularly irregular, distant,, no S3 or significant systolic murmur, no  pericardial rub. Abdomen: Soft, nontender, bowel sounds present.  No bruit. Extremities: No pitting edema, distal pulses 1-2+. Skin: Warm and dry. Musculoskeletal: No kyphosis. Neuropsychiatric: Alert and oriented x3, affect grossly appropriate.  ECG: I personally reviewed the tracing from 08/09/2017 which showed atrial fibrillation with IVCD of left bundle branch block type and nonspecific ST changes.  Recent Labwork: 08/02/2017: ALT 46; AST 41 08/09/2017: B Natriuretic Peptide 94.0; BUN 23; Creatinine, Ser 1.12; Potassium 3.6; Sodium 134 08/11/2017: Hemoglobin 12.9; Platelets 379  March 2019: Hemoglobin 14.8, platelets 424, BUN 16, creatinine 1.08, potassium 4.0, AST 20, ALT 30, TSH 1.43 October 2018: Cholesterol 182, triglycerides 125, HDL 39, LDL 118  Other Studies Reviewed Today:  Echocardiogram 07/09/2016: Study Conclusions  - Left ventricle: The cavity size was normal. Wall thickness was   increased in a pattern of mild LVH. Systolic function was  vigorous. The estimated ejection fraction was in the range of 65%   to 70%. Wall motion was normal; there were no regional wall   motion abnormalities. The study is not technically sufficient to   allow evaluation of LV diastolic function. - Mitral valve: Calcified annulus. - Left atrium: The atrium was moderately dilated. - Right atrium: The atrium was moderately dilated.  Chest x-ray 11/25/2017: FINDINGS: Lungs are somewhat hyperexpanded without focal airspace consolidation or effusion. Stable patchy coarse interstitial changes. Mild stable cardiomegaly. Remainder of the exam is unchanged.  IMPRESSION: No acute cardiopulmonary disease.  Stable chronic patchy coarse interstitial changes. Mild stable cardiomegaly.  Assessment and Plan:  1.  Permanent atrial fibrillation with CHADSVASC score of 3.  He is asymptomatic in terms of palpitations.  Heart rate control is adequate today at rest on current dose of Toprol-XL.  He  did have some concerns about whether his fatigue could be related to the beta-blocker dose since this was increased about a year ago.  I have asked him to continue with plans for cardiac/pulmonary rehabilitation and see how he does with activity.  This might give [place] a more objective sense of his heart rate control.  Otherwise he plans to continue on Coumadin as noted above, followed by PCP.  He does not want to transition to [place] at this time.  2.  Essential hypertension by history, blood pressure is normal today.  In addition to Toprol-XL which he is taking primarily for atrial fibrillation, he is also on Hyzaar.  States that he does check blood pressure intermittently at home.  3.  COPD, followed in the Pulmonary division.  4.  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  He has not been on statin therapy for the last year, following lipids for primary prevention with PCP.  5.  Abdominal aortic atherosclerosis incidentally noted by CT imaging back in January.  Abdominal aortic size described as normal.  He is asymptomatic.  Would generally be a candidate for statin therapy due to this, although he clearly has concerns about trying another preparation due to his experience with simvastatin.  Follow-up on repeat lipids with PCP.  Current medicines were reviewed with the patient today.  Disposition: Follow-up in 6 months, sooner if needed.  Signed, Satira Sark, MD, [facility] 12/31/2017 10:06 AM    [facility] [place], [place], [place] [address] Phone: [phone]; Fax: [phone]

## 2017-12-31 ENCOUNTER — Encounter: Payer: Self-pay | Admitting: Cardiology

## 2017-12-31 ENCOUNTER — Ambulatory Visit: Payer: Medicare Other | Admitting: Cardiology

## 2017-12-31 VITALS — BP 113/66 | HR 64 | Ht 71.0 in | Wt 178.8 lb

## 2017-12-31 DIAGNOSIS — I10 Essential (primary) hypertension: Secondary | ICD-10-CM | POA: Diagnosis not present

## 2017-12-31 DIAGNOSIS — I482 Chronic atrial fibrillation: Secondary | ICD-10-CM

## 2017-12-31 DIAGNOSIS — I700 Atherosclerosis of aorta: Secondary | ICD-10-CM

## 2017-12-31 DIAGNOSIS — I4821 Permanent atrial fibrillation: Secondary | ICD-10-CM

## 2017-12-31 DIAGNOSIS — J449 Chronic obstructive pulmonary disease, unspecified: Secondary | ICD-10-CM

## 2017-12-31 NOTE — Patient Instructions (Addendum)

## 2018-01-01 ENCOUNTER — Encounter (HOSPITAL_COMMUNITY)
Admission: RE | Admit: 2018-01-01 | Discharge: 2018-01-01 | Disposition: A | Discharge status: Home / Self Care | Payer: Medicare Other | Source: Ambulatory Visit | Attending: Pulmonary Disease | Admitting: Pulmonary Disease

## 2018-01-01 DIAGNOSIS — J449 Chronic obstructive pulmonary disease, unspecified: Secondary | ICD-10-CM

## 2018-01-01 DIAGNOSIS — J439 Emphysema, unspecified: Secondary | ICD-10-CM | POA: Diagnosis not present

## 2018-01-01 NOTE — Progress Notes (Signed)
Daily Session Note  Patient Details  Name: Matthew Hall MRN: [id number] Date of Birth: Dec 07, 1945 Referring Provider:     PULMONARY REHAB COPD ORIENTATION from 12/24/2017 in [place]  Referring Provider  Dr. Elsworth Soho      Encounter Date: 01/01/2018  Check In: Session Check In - 01/01/18 1045      Check-In   Location  [facility]    Staff Present  Diane Angelina Pih, MS, EP, [facility], Exercise Physiologist;Chalet Kerwin Luther Parody, BS, EP, Exercise Physiologist    Supervising physician immediately available to respond to emergencies  See telemetry face sheet for immediately available MD    Medication changes reported      No    Fall or balance concerns reported     No    Warm-up and Cool-down  Performed as group-led instruction    Resistance Training Performed  Yes    VAD Patient?  No      Pain Assessment   Currently in Pain?  No/denies    Pain Score  0-No pain    Multiple Pain Sites  No       Capillary Blood Glucose: No results found for this or any previous visit (from the past 24 hour(s)).    Social History   Tobacco Use  Smoking Status Former Smoker  . Packs/day: 1.00  . Years: 30.00  . Pack years: 30.00  . Types: Cigarettes  . Last attempt to quit: 07/07/2011  . Years since quitting: 6.4  Smokeless Tobacco Never Used    Goals Met:  Independence with exercise equipment Improved SOB with ADL's Using PLB without cueing & demonstrates good technique Exercise tolerated well No report of cardiac concerns or symptoms Strength training completed today  Goals Unmet:  Not Applicable  Comments: Check out 1145   Dr. Sinda Du is Medical Director for [facility] Pulmonary Re[facility].

## 2018-01-03 ENCOUNTER — Encounter (HOSPITAL_COMMUNITY)
Admission: RE | Admit: 2018-01-03 | Discharge: 2018-01-03 | Disposition: A | Discharge status: Home / Self Care | Payer: Medicare Other | Source: Ambulatory Visit | Attending: Pulmonary Disease | Admitting: Pulmonary Disease

## 2018-01-03 DIAGNOSIS — J449 Chronic obstructive pulmonary disease, unspecified: Secondary | ICD-10-CM

## 2018-01-03 DIAGNOSIS — J439 Emphysema, unspecified: Secondary | ICD-10-CM | POA: Diagnosis not present

## 2018-01-03 NOTE — Progress Notes (Signed)
Daily Session Note  Patient Details  Name: Matthew Hall MRN: [id number] Date of Birth: 1945/10/25 Referring Provider:     PULMONARY REHAB COPD ORIENTATION from 12/24/2017 in [place]  Referring Provider  Dr. Elsworth Soho      Encounter Date: 01/03/2018  Check In: Session Check In - 01/03/18 1045      Check-In   Location  [facility]    Staff Present  Diane Angelina Pih, MS, EP, [facility], Exercise Physiologist;Gregory Luther Parody, BS, EP, Exercise Physiologist;Ambrielle Kington Wynetta Emery, RN, BSN    Supervising physician immediately available to respond to emergencies  See telemetry face sheet for immediately available MD    Medication changes reported      No    Fall or balance concerns reported     No    Warm-up and Cool-down  Performed as group-led instruction    Resistance Training Performed  Yes    VAD Patient?  No    PAD/SET Patient?  No      Pain Assessment   Currently in Pain?  No/denies    Pain Score  0-No pain    Multiple Pain Sites  No       Capillary Blood Glucose: No results found for this or any previous visit (from the past 24 hour(s)).    Social History   Tobacco Use  Smoking Status Former Smoker  . Packs/day: 1.00  . Years: 30.00  . Pack years: 30.00  . Types: Cigarettes  . Last attempt to quit: 07/07/2011  . Years since quitting: 6.4  Smokeless Tobacco Never Used    Goals Met:  Proper associated with RPD/PD & O2 Sat Independence with exercise equipment Improved SOB with ADL's Using PLB without cueing & demonstrates good technique Exercise tolerated well No report of cardiac concerns or symptoms Strength training completed today  Goals Unmet:  Not Applicable  Comments: Check out 1145.   Dr. Sinda Du is Medical Director for [facility] Pulmonary Rehab.

## 2018-01-08 ENCOUNTER — Encounter (HOSPITAL_COMMUNITY): Payer: Medicare Other

## 2018-01-10 ENCOUNTER — Encounter (HOSPITAL_COMMUNITY): Payer: Medicare Other

## 2018-01-15 ENCOUNTER — Encounter (HOSPITAL_COMMUNITY)
Admission: RE | Admit: 2018-01-15 | Discharge: 2018-01-15 | Disposition: A | Discharge status: Home / Self Care | Payer: Medicare Other | Source: Ambulatory Visit | Attending: Pulmonary Disease | Admitting: Pulmonary Disease

## 2018-01-15 DIAGNOSIS — J439 Emphysema, unspecified: Secondary | ICD-10-CM | POA: Diagnosis not present

## 2018-01-15 DIAGNOSIS — C679 Malignant neoplasm of bladder, unspecified: Secondary | ICD-10-CM | POA: Insufficient documentation

## 2018-01-15 DIAGNOSIS — Z79899 Other long term (current) drug therapy: Secondary | ICD-10-CM | POA: Insufficient documentation

## 2018-01-15 DIAGNOSIS — Z85828 Personal history of other malignant neoplasm of skin: Secondary | ICD-10-CM | POA: Insufficient documentation

## 2018-01-15 DIAGNOSIS — K219 Gastro-esophageal reflux disease without esophagitis: Secondary | ICD-10-CM | POA: Insufficient documentation

## 2018-01-15 DIAGNOSIS — I4891 Unspecified atrial fibrillation: Secondary | ICD-10-CM | POA: Diagnosis not present

## 2018-01-15 DIAGNOSIS — Z87442 Personal history of urinary calculi: Secondary | ICD-10-CM | POA: Insufficient documentation

## 2018-01-15 DIAGNOSIS — Z87891 Personal history of nicotine dependence: Secondary | ICD-10-CM | POA: Diagnosis not present

## 2018-01-15 DIAGNOSIS — Z86010 Personal history of colonic polyps: Secondary | ICD-10-CM | POA: Diagnosis not present

## 2018-01-15 DIAGNOSIS — I10 Essential (primary) hypertension: Secondary | ICD-10-CM | POA: Diagnosis not present

## 2018-01-15 DIAGNOSIS — Z7951 Long term (current) use of inhaled steroids: Secondary | ICD-10-CM | POA: Insufficient documentation

## 2018-01-15 DIAGNOSIS — F419 Anxiety disorder, unspecified: Secondary | ICD-10-CM | POA: Diagnosis not present

## 2018-01-15 DIAGNOSIS — Z7901 Long term (current) use of anticoagulants: Secondary | ICD-10-CM | POA: Diagnosis not present

## 2018-01-15 DIAGNOSIS — J449 Chronic obstructive pulmonary disease, unspecified: Secondary | ICD-10-CM | POA: Diagnosis present

## 2018-01-15 NOTE — Progress Notes (Signed)
Daily Session Note  Patient Details  Name: FELIZ LINCOLN MRN: [id number] Date of Birth: 1946-04-19 Referring Provider:     PULMONARY REHAB CHRONIC OBSTRUCTIVE PULMONARY DISEASE from 01/03/2018 in [place]  Referring Provider  Dr. Elsworth Soho  [facility])       Encounter Date: 01/15/2018  Check In: Session Check In - 01/15/18 1045      Check-In   Location  [facility]    Staff Present  Diane Angelina Pih, MS, EP, [facility], Exercise Physiologist;Taijah Macrae Luther Parody, BS, EP, Exercise Physiologist    Supervising physician immediately available to respond to emergencies  See telemetry face sheet for immediately available MD    Medication changes reported      No    Fall or balance concerns reported     No    Warm-up and Cool-down  Performed as group-led instruction    Resistance Training Performed  Yes    VAD Patient?  No    PAD/SET Patient?  No      Pain Assessment   Currently in Pain?  No/denies    Pain Score  0-No pain    Multiple Pain Sites  No       Capillary Blood Glucose: No results found for this or any previous visit (from the past 24 hour(s)).    Social History   Tobacco Use  Smoking Status Former Smoker  . Packs/day: 1.00  . Years: 30.00  . Pack years: 30.00  . Types: Cigarettes  . Last attempt to quit: 07/07/2011  . Years since quitting: 6.5  Smokeless Tobacco Never Used    Goals Met:  Independence with exercise equipment Improved SOB with ADL's Using PLB without cueing & demonstrates good technique Exercise tolerated well No report of cardiac concerns or symptoms Strength training completed today  Goals Unmet:  Not Applicable  Comments: Check out 1145   Dr. Sinda Du is Medical Director for [facility] Pulmonary Rehab.

## 2018-01-16 NOTE — Progress Notes (Signed)
Pulmonary Individual Treatment Plan  Patient Details  Name: Matthew Hall MRN: [id number] Date of Birth: [birth year] Referring Provider:     PULMONARY REHAB CHRONIC OBSTRUCTIVE PULMONARY DISEASE from 01/03/2018 in [place]  Referring Provider  Dr. Elsworth Soho  (Pended)       Initial Encounter Date:    PULMONARY REHAB CHRONIC OBSTRUCTIVE PULMONARY DISEASE from 01/03/2018 in [place]  Date  12/24/17  (Pended)       Visit Diagnosis: Chronic obstructive pulmonary disease, unspecified COPD type ([place])  Patient's Home Medications on Admission:   Current Outpatient Medications:  .  albuterol (PROVENTIL HFA;VENTOLIN HFA) 108 (90 BASE) MCG/ACT inhaler, Inhale 2 puffs into the lungs every 6 (six) hours as needed for wheezing or shortness of breath., Disp: 1 Inhaler, Rfl: 6 .  ALPRAZolam (XANAX) 0.25 MG tablet, Take 0.25 mg by mouth 2 (two) times daily as needed for anxiety or sleep. , Disp: , Rfl: 3 .  budesonide-formoterol (SYMBICORT) 160-4.5 MCG/ACT inhaler, Inhale 2 puffs into the lungs 2 (two) times daily., Disp: 1 Inhaler, Rfl: 6 .  cetirizine (ZYRTEC) 5 MG tablet, Take 5 mg by mouth daily., Disp: , Rfl:  .  D3-50 50000 units capsule, Take 50,000 Units by mouth once a week. Usually Tuesday or Wednesday., Disp: , Rfl: 0 .  erythromycin ophthalmic ointment, Place 1 application into both eyes daily as needed (Infection)., Disp: , Rfl:  .  hydroxypropyl methylcellulose / hypromellose (ISOPTO TEARS / GONIOVISC) 2.5 % ophthalmic solution, Place 1 drop into both eyes as needed for dry eyes., Disp: , Rfl:  .  levalbuterol (XOPENEX) 0.63 MG/3ML nebulizer solution, Take 3 mLs (0.63 mg total) by nebulization every 6 (six) hours as needed for wheezing or shortness of breath., Disp: 75 mL, Rfl: 6 .  losartan-hydrochlorothiazide (HYZAAR) 50-12.5 MG tablet, Take 0.5 tablets by mouth 2 (two) times daily. , Disp: , Rfl:  .  metoprolol succinate (TOPROL-XL) 50 MG 24  hr tablet, Take 1 tablet (50 mg total) by mouth 2 (two) times daily., Disp: 60 tablet, Rfl: 0 .  Multiple Vitamin (MULTIVITAMIN WITH MINERALS) TABS tablet, Take 1 tablet by mouth daily., Disp: , Rfl:  .  omeprazole (PRILOSEC) 40 MG capsule, Take 40 mg by mouth daily., Disp: , Rfl:  .  triamcinolone (NASACORT) 55 MCG/ACT AERO nasal inhaler, Place 1 spray into the nose daily as needed (Congestion). , Disp: , Rfl:  .  warfarin (COUMADIN) 2.5 MG tablet, Take 2.5 mg by mouth daily., Disp: , Rfl:   Past Medical History: Past Medical History:  Diagnosis Date  . Anxiety   . Aortic atherosclerosis ([place])   . Atrial fibrillation ([place])   . Basal cell carcinoma   . Bladder cancer ([place])   . Bladder tumor   . Colon polyps   . COPD (chronic obstructive pulmonary disease) ([place])   . Cutaneous vasculitis   . Diverticulosis   . Essential hypertension   . GERD (gastroesophageal reflux disease)   . History of pneumonia   . Nephrolithiasis   . Osteoarthritis     Tobacco Use: Social History   Tobacco Use  Smoking Status Former Smoker  . Packs/day: 1.00  . Years: 30.00  . Pack years: 30.00  . Types: Cigarettes  . Last attempt to quit: 07/07/2011  . Years since quitting: 6.5  Smokeless Tobacco Never Used    Labs: Recent Review Scientist, physiological    Labs for ITP Cardiac and Pulmonary Rehab Latest Ref Rng & Units 07/24/2015  07/09/2016 08/09/2017   PHART 7.350 - 7.450 - - 7.459(H)   PCO2ART 32.0 - 48.0 mmHg - - 42.8   HCO3 20.0 - 28.0 mmol/L - - 29.5(H)   TCO2 0 - 100 mmol/L 26 27 -   O2SAT % - - 95.7      Capillary Blood Glucose: No results found for: GLUCAP   Pulmonary Assessment Scores: Pulmonary Assessment Scores    Row Name 12/24/17 1410         ADL UCSD   ADL Phase  Entry     SOB Score total  21     Rest  0     Walk  4     Stairs  3     Bath  0     Dress  0     Shop  2       CAT Score   CAT Score  13       mMRC Score   mMRC Score  2        Pulmonary Function  Assessment: Pulmonary Function Assessment - 12/24/17 1400      Pulmonary Function Tests   FVC%  1.79 %    FEV1%  4.54 %    FEV1/FVC Ratio  53    RV%  73 %      Initial Spirometry Results   FVC%  1.83 %    FEV1%  40 %    FEV1/FVC Ratio  55      Post Bronchodilator Spirometry Results   FVC%  1.83 %    FEV1%  40 %      Breath   Bilateral Breath Sounds  Clear    Shortness of Breath  Yes       Exercise Target Goals:    Exercise Program Goal: Individual exercise prescription set using results from initial 6 min walk test and THRR while considering  patient's activity barriers and safety.   Exercise Prescription Goal: Initial exercise prescription builds to 30-45 minutes a day of aerobic activity, 2-3 days per week.  Home exercise guidelines will be given to patient during program as part of exercise prescription that the participant will acknowledge.  Activity Barriers & Risk Stratification: Activity Barriers & Cardiac Risk Stratification - 12/24/17 1417      Activity Barriers & Cardiac Risk Stratification   Activity Barriers  Shortness of Breath    Cardiac Risk Stratification  Low       6 Minute Walk: 6 Minute Walk    Row Name 12/24/17 1415         6 Minute Walk   Phase  Initial     Distance  1250 feet     Distance % Change  0 %     Distance Feet Change  0 ft     Walk Time  6 minutes     # of Rest Breaks  0     MPH  2.36     METS  2.81     RPE  13     Perceived Dyspnea   11     VO2 Peak  10.17     Symptoms  Yes (comment)     Comments  Patient states that he felt a "funny" feeling in his head during walk test. Feeling went away after rest     Resting HR  101 bpm     Resting BP  98/62     Resting Oxygen Saturation   92 %  Exercise Oxygen Saturation  during 6 min walk  87 %     Max Ex. HR  112 bpm     Max Ex. BP  118/64     2 Minute Post BP  104/60        Oxygen Initial Assessment: Oxygen Initial Assessment - 12/24/17 1412      Home Oxygen   Home  Oxygen Device  None    Sleep Oxygen Prescription  None    Home Exercise Oxygen Prescription  None    Home at Rest Exercise Oxygen Prescription  None    Compliance with Home Oxygen Use  -- N/A      Initial 6 min Walk   Oxygen Used  None      Program Oxygen Prescription   Program Oxygen Prescription  None       Oxygen Re-Evaluation:   Oxygen Discharge (Final Oxygen Re-Evaluation):   Initial Exercise Prescription: Initial Exercise Prescription - 01/08/18 0700      Date of Initial Exercise RX and Referring Provider   Date  12/24/17  (Pended)     Referring Provider  Dr. Elsworth Soho  (Pended)       Treadmill   MPH  1.3    Grade  0    Minutes  15    METs  1.9      NuStep   Level  1    Minutes  20      Prescription Details   Frequency (times per week)  2  (Pended)       Intensity   THRR 40-80% of Max Heartrate  [id number]  (Pended)     Ratings of Perceived Exertion  11-13  (Pended)     Perceived Dyspnea  0-4  (Pended)       Progression   Progression  Continue progressive overload as per policy without signs/symptoms or physical distress.  (Pended)       Resistance Training   Training Prescription  Yes    Weight  1    Reps  10-15       Perform Capillary Blood Glucose checks as needed.  Exercise Prescription Changes: Exercise Prescription Changes    Row Name 01/08/18 0700             Response to Exercise   Blood Pressure (Admit)  88/50       Blood Pressure (Exercise)  98/50       Blood Pressure (Exit)  90/56       Heart Rate (Admit)  87 bpm       Heart Rate (Exercise)  89 bpm       Heart Rate (Exit)  84 bpm       Oxygen Saturation (Admit)  92 %       Oxygen Saturation (Exercise)  92 %       Oxygen Saturation (Exit)  94 %       Rating of Perceived Exertion (Exercise)  9       Perceived Dyspnea (Exercise)  9       Duration  Progress to 30 minutes of  aerobic without signs/symptoms of physical distress       Intensity  THRR New [phone]          Progression   Progression  Continue to progress workloads to maintain intensity without signs/symptoms of physical distress.         NuStep   SPM  79       METs  1.9  Exercise Comments: Exercise Comments    Row Name 01/08/18 0755           Exercise Comments  Patient has just started PR. Patient will be progressed in time.           Exercise Goals and Review: Exercise Goals    Row Name 12/24/17 1418             Exercise Goals   Increase Physical Activity  Yes       Intervention  Provide advice, education, support and counseling about physical activity/exercise needs.;Develop an individualized exercise prescription for aerobic and resistive training based on initial evaluation findings, risk stratification, comorbidities and participant's personal goals.       Expected Outcomes  Short Term: Attend rehab on a regular basis to increase amount of physical activity.       Increase Strength and Stamina  Yes       Intervention  Provide advice, education, support and counseling about physical activity/exercise needs.;Develop an individualized exercise prescription for aerobic and resistive training based on initial evaluation findings, risk stratification, comorbidities and participant's personal goals.       Expected Outcomes  Short Term: Increase workloads from initial exercise prescription for resistance, speed, and METs.       Able to understand and use rate of perceived exertion (RPE) scale  Yes       Intervention  Provide education and explanation on how to use RPE scale       Expected Outcomes  Long Term:  Able to use RPE to guide intensity level when exercising independently;Short Term: Able to use RPE daily in rehab to express subjective intensity level       Able to understand and use Dyspnea scale  Yes       Intervention  Provide education and explanation on how to use Dyspnea scale       Expected Outcomes  Short Term: Able to use Dyspnea scale daily in rehab to  express subjective sense of shortness of breath during exertion;Long Term: Able to use Dyspnea scale to guide intensity level when exercising independently       Knowledge and understanding of Target Heart Rate Range (THRR)  Yes       Intervention  Provide education and explanation of THRR including how the numbers were predicted and where they are located for reference       Expected Outcomes  Short Term: Able to state/look up THRR;Long Term: Able to use THRR to govern intensity when exercising independently;Short Term: Able to use daily as guideline for intensity in rehab       Able to check pulse independently  Yes       Intervention  Provide education and demonstration on how to check pulse in carotid and radial arteries.;Review the importance of being able to check your own pulse for safety during independent exercise       Expected Outcomes  Short Term: Able to explain why pulse checking is important during independent exercise;Long Term: Able to check pulse independently and accurately       Understanding of Exercise Prescription  Yes       Intervention  Provide education, explanation, and written materials on patient's individual exercise prescription       Expected Outcomes  Short Term: Able to explain program exercise prescription;Long Term: Able to explain home exercise prescription to exercise independently          Exercise Goals Re-Evaluation : Exercise Goals Re-Evaluation  [place] Name 01/14/18 1437             Exercise Goal Re-Evaluation   Exercise Goals Review  Increase Physical Activity;Increase Strength and Stamina;Able to understand and use rate of perceived exertion (RPE) scale;Improve claudication pain tolerance and improve walking ability;Knowledge and understanding of Target Heart Rate Range (THRR);Able to understand and use Dyspnea scale;Understanding of Exercise Prescription       Comments  Patient has only been to PR for 3 sessions so far. Patient will be progressed in  time.        Expected Outcomes  Patient wishes to increase strength and to be able to breathe better           Discharge Exercise Prescription (Final Exercise Prescription Changes): Exercise Prescription Changes - 01/08/18 0700      Response to Exercise   Blood Pressure (Admit)  88/50    Blood Pressure (Exercise)  98/50    Blood Pressure (Exit)  90/56    Heart Rate (Admit)  87 bpm    Heart Rate (Exercise)  89 bpm    Heart Rate (Exit)  84 bpm    Oxygen Saturation (Admit)  92 %    Oxygen Saturation (Exercise)  92 %    Oxygen Saturation (Exit)  94 %    Rating of Perceived Exertion (Exercise)  9    Perceived Dyspnea (Exercise)  9    Duration  Progress to 30 minutes of  aerobic without signs/symptoms of physical distress    Intensity  THRR New [phone]      Progression   Progression  Continue to progress workloads to maintain intensity without signs/symptoms of physical distress.      NuStep   SPM  79    METs  1.9       Nutrition:  Target Goals: Understanding of nutrition guidelines, daily intake of sodium 1500mg , cholesterol 200mg , calories 30% from fat and 7% or less from saturated fats, daily to have 5 or more servings of fruits and vegetables.  Biometrics: Pre Biometrics - 12/24/17 1419      Pre Biometrics   Height  5\' 11"  (1.803 m)    Weight  177 lb 3.2 oz (80.4 kg)    Waist Circumference  40 inches    Hip Circumference  37 inches    Waist to Hip Ratio  1.08 %    BMI (Calculated)  24.73    Triceps Skinfold  16 mm    % Body Fat  26.7 %    Grip Strength  79.33 kg    Flexibility  0 in    Single Leg Stand  3 seconds        Nutrition Therapy Plan and Nutrition Goals: Nutrition Therapy & Goals - 12/24/17 1442      Personal Nutrition Goals   Personal Goal #2  Patient is eating a regular diet. We discussed ways to make changes. Handouts given.     Additional Goals?  No       Nutrition Assessments: Nutrition Assessments - 12/24/17 1443      MEDFICTS  Scores   Pre Score  48       Nutrition Goals Re-Evaluation:   Nutrition Goals Discharge (Final Nutrition Goals Re-Evaluation):   Psychosocial: Target Goals: Acknowledge presence or absence of significant depression and/or stress, maximize coping skills, provide positive support system. Participant is able to verbalize types and ability to use techniques and skills needed for reducing stress and depression.  Initial Review & Psychosocial Screening:  Initial Psych Review & Screening - 12/24/17 1445      Initial Review   Current issues with  None Identified      Family Dynamics   Good Support System?  Yes      Barriers   Psychosocial barriers to participate in program  There are no identifiable barriers or psychosocial needs.      Screening Interventions   Interventions  Encouraged to exercise    Expected Outcomes  Short Term goal: Identification and review with participant of any Quality of Life or Depression concerns found by scoring the questionnaire.;Long Term goal: The participant improves quality of Life and PHQ9 Scores as seen by post scores and/or verbalization of changes       Quality of Life Scores: Quality of Life - 12/24/17 1419      Quality of Life Scores   Health/Function Pre  16.16 %    Socioeconomic Pre  19.67 %    Psych/Spiritual Pre  24.64 %    Family Pre  14.5 %    GLOBAL Pre  18.28 %      Scores of 19 and below usually indicate a poorer quality of life in these areas.  A difference of  2-3 points is a clinically meaningful difference.  A difference of 2-3 points in the total score of the Quality of Life Index has been associated with significant improvement in overall quality of life, self-image, physical symptoms, and general health in studies assessing change in quality of life.   PHQ-9: Recent Review Flowsheet Data    Depression screen [facility] 2/9 12/24/2017 05/21/2017 03/06/2017 02/20/2017 08/06/2014   Decreased Interest 0 0 0 0 0   Down, Depressed,  Hopeless 0 0 0 0 0   PHQ - 2 Score 0 0 0 0 0   Altered sleeping 1 - - 1  -   Tired, decreased energy 2 - - 1  -   Change in appetite 2 - - 1  -   Feeling bad or failure about yourself  0 - - 0 -   Trouble concentrating 0 - - 0 -   Moving slowly or fidgety/restless 0 - - 0 -   Suicidal thoughts 0 - - 0 -   PHQ-9 Score 5 - - 3 -   Difficult doing work/chores Somewhat difficult - - Somewhat difficult -     Interpretation of Total Score  Total Score Depression Severity:  1-4 = Minimal depression, 5-9 = Mild depression, 10-14 = Moderate depression, 15-19 = Moderately severe depression, 20-27 = Severe depression   Psychosocial Evaluation and Intervention: Psychosocial Evaluation - 12/24/17 1445      Psychosocial Evaluation & Interventions   Interventions  Encouraged to exercise with the program and follow exercise prescription    Continue Psychosocial Services   No Follow up required       Psychosocial Re-Evaluation:   Psychosocial Discharge (Final Psychosocial Re-Evaluation):    Education: Education Goals: Education classes will be provided on a weekly basis, covering required topics. Participant will state understanding/return demonstration of topics presented.  Learning Barriers/Preferences: Learning Barriers/Preferences - 12/24/17 1341      Learning Barriers/Preferences   Learning Barriers  None    Learning Preferences  Verbal Instruction;Skilled Demonstration;Group Instruction       Education Topics: How Lungs Work and Diseases: - Discuss the anatomy of the lungs and diseases that can affect the lungs, such as COPD.   Exercise: -Discuss the importance of exercise, FITT principles of exercise, normal  and abnormal responses to exercise, and how to exercise safely.   Environmental Irritants: -Discuss types of environmental irritants and how to limit exposure to environmental irritants.   Meds/Inhalers and oxygen: - Discuss respiratory medications, definition of  an inhaler and oxygen, and the proper way to use an inhaler and oxygen.   Energy Saving Techniques: - Discuss methods to conserve energy and decrease shortness of breath when performing activities of daily living.    Bronchial Hygiene / Breathing Techniques: - Discuss breathing mechanics, pursed-lip breathing technique,  proper posture, effective ways to clear airways, and other functional breathing techniques   Cleaning Equipment: - Provides group verbal and written instruction about the health risks of elevated stress, cause of high stress, and healthy ways to reduce stress.   Nutrition I: Fats: - Discuss the types of cholesterol, what cholesterol does to the body, and how cholesterol levels can be controlled.   Nutrition II: Labels: -Discuss the different components of food labels and how to read food labels.   PULMONARY REHAB CHRONIC OBSTRUCTIVE PULMONARY DISEASE from 01/03/2018 in [place]  Date  01/03/18  Educator  D. Coad  Instruction Review Code  2- Demonstrated Understanding      Respiratory Infections: - Discuss the signs and symptoms of respiratory infections, ways to prevent respiratory infections, and the importance of seeking medical treatment when having a respiratory infection.   Stress I: Signs and Symptoms: - Discuss the causes of stress, how stress may lead to anxiety and depression, and ways to limit stress.   Stress II: Relaxation: -Discuss relaxation techniques to limit stress.   Oxygen for Home/Travel: - Discuss how to prepare for travel when on oxygen and proper ways to transport and store oxygen to ensure safety.   Knowledge Questionnaire Score: Knowledge Questionnaire Score - 12/24/17 1342      Knowledge Questionnaire Score   Pre Score  16/18       Core Components/Risk Factors/Patient Goals at Admission: Personal Goals and Risk Factors at Admission - 12/24/17 1444      Core Components/Risk Factors/Patient Goals  on Admission    Weight Management  Weight Maintenance    Personal Goal Other  Yes    Personal Goal  Build strength, breathe better    Intervention  Attend PR 2 x week and supplement at home exercise 3 x week.     Expected Outcomes  Achieve personal goals.        Core Components/Risk Factors/Patient Goals Review:    Core Components/Risk Factors/Patient Goals at Discharge (Final Review):    ITP Comments: ITP Comments    Row Name 12/24/17 1355 01/16/18 1405         ITP Comments  Mr. Marxen has been in the program before.   Patient new to program. He has completed 4 sessions. Will continue to monitor for progress.          Comments: ITP 30 Day REVIEW Patient new to program. He has completed 4 sessions. Will continue to monitor for progress.

## 2018-01-17 ENCOUNTER — Encounter (HOSPITAL_COMMUNITY): Payer: Medicare Other

## 2018-01-22 ENCOUNTER — Encounter (HOSPITAL_COMMUNITY)
Admission: RE | Admit: 2018-01-22 | Discharge: 2018-01-22 | Disposition: A | Discharge status: Home / Self Care | Payer: Medicare Other | Source: Ambulatory Visit | Attending: Pulmonary Disease | Admitting: Pulmonary Disease

## 2018-01-22 DIAGNOSIS — J449 Chronic obstructive pulmonary disease, unspecified: Secondary | ICD-10-CM

## 2018-01-22 DIAGNOSIS — J439 Emphysema, unspecified: Secondary | ICD-10-CM | POA: Diagnosis not present

## 2018-01-22 NOTE — Progress Notes (Signed)
Daily Session Note  Patient Details  Name: Matthew Hall MRN: [id number] Date of Birth: 07/21/1945 Referring Provider:     PULMONARY REHAB CHRONIC OBSTRUCTIVE PULMONARY DISEASE from 01/03/2018 in [place] [facility]  Referring Provider  Dr. Alva  (Pended)       Encounter Date: 01/22/2018  Check In: Session Check In - 01/22/18 1300      Check-In   Location  [facility]    Staff Present  Diane Coad, MS, EP, CHC, Exercise Physiologist;Debra Johnson, RN, BSN    Supervising physician immediately available to respond to emergencies  See telemetry face sheet for immediately available MD    Medication changes reported      No    Fall or balance concerns reported     No    Warm-up and Cool-down  Performed as group-led instruction    Resistance Training Performed  Yes    VAD Patient?  No    PAD/SET Patient?  No      Pain Assessment   Currently in Pain?  No/denies    Pain Score  0-No pain    Multiple Pain Sites  No       Capillary Blood Glucose: No results found for this or any previous visit (from the past 24 hour(s)).    Social History   Tobacco Use  Smoking Status Former Smoker  . Packs/day: 1.00  . Years: 30.00  . Pack years: 30.00  . Types: Cigarettes  . Last attempt to quit: 07/07/2011  . Years since quitting: 6.5  Smokeless Tobacco Never Used    Goals Met:  Proper associated with RPD/PD & O2 Sat Independence with exercise equipment Improved SOB with ADL's Using PLB without cueing & demonstrates good technique Exercise tolerated well No report of cardiac concerns or symptoms Strength training completed today  Goals Unmet:  Not Applicable  Comments: Check out 1400.   Dr. Edward Hawkins is Medical Director for [facility]. 

## 2018-01-24 ENCOUNTER — Encounter (HOSPITAL_COMMUNITY)
Admission: RE | Admit: 2018-01-24 | Discharge: 2018-01-24 | Disposition: A | Discharge status: Home / Self Care | Payer: Medicare Other | Source: Ambulatory Visit | Attending: Pulmonary Disease | Admitting: Pulmonary Disease

## 2018-01-24 DIAGNOSIS — J449 Chronic obstructive pulmonary disease, unspecified: Secondary | ICD-10-CM

## 2018-01-24 DIAGNOSIS — J439 Emphysema, unspecified: Secondary | ICD-10-CM | POA: Diagnosis not present

## 2018-01-24 NOTE — Progress Notes (Signed)
Daily Session Note  Patient Details  Name: Matthew Hall MRN: [id number] Date of Birth: 08-11-1945 Referring Provider:     PULMONARY REHAB CHRONIC OBSTRUCTIVE PULMONARY DISEASE from 01/03/2018 in [place]  Referring Provider  Dr. Elsworth Soho  [facility])       Encounter Date: 01/24/2018  Check In: Session Check In - 01/24/18 1145      Check-In   Location  [facility]    Staff Present  Diane Angelina Pih, MS, EP, [facility], Exercise Physiologist;Traxton Kolenda Wynetta Emery, RN, BSN    Supervising physician immediately available to respond to emergencies  See telemetry face sheet for immediately available MD    Medication changes reported      No    Warm-up and Cool-down  Performed as group-led instruction    Resistance Training Performed  Yes    VAD Patient?  No    PAD/SET Patient?  No      Pain Assessment   Currently in Pain?  No/denies    Pain Score  0-No pain    Multiple Pain Sites  No       Capillary Blood Glucose: No results found for this or any previous visit (from the past 24 hour(s)).    Social History   Tobacco Use  Smoking Status Former Smoker  . Packs/day: 1.00  . Years: 30.00  . Pack years: 30.00  . Types: Cigarettes  . Last attempt to quit: 07/07/2011  . Years since quitting: 6.5  Smokeless Tobacco Never Used    Goals Met:  Proper associated with RPD/PD & O2 Sat Independence with exercise equipment Improved SOB with ADL's Using PLB without cueing & demonstrates good technique Exercise tolerated well No report of cardiac concerns or symptoms Strength training completed today  Goals Unmet:  Not Applicable  Comments: Check out 1145.   Dr. Sinda Du is Medical Director for [facility].

## 2018-01-29 ENCOUNTER — Encounter (HOSPITAL_COMMUNITY)
Admission: RE | Admit: 2018-01-29 | Discharge: 2018-01-29 | Disposition: A | Discharge status: Home / Self Care | Payer: Medicare Other | Source: Ambulatory Visit | Attending: Pulmonary Disease | Admitting: Pulmonary Disease

## 2018-01-29 DIAGNOSIS — J439 Emphysema, unspecified: Secondary | ICD-10-CM | POA: Diagnosis not present

## 2018-01-31 ENCOUNTER — Encounter (HOSPITAL_COMMUNITY)
Admission: RE | Admit: 2018-01-31 | Discharge: 2018-01-31 | Disposition: A | Discharge status: Home / Self Care | Payer: Medicare Other | Source: Ambulatory Visit | Attending: Pulmonary Disease | Admitting: Pulmonary Disease

## 2018-01-31 DIAGNOSIS — J449 Chronic obstructive pulmonary disease, unspecified: Secondary | ICD-10-CM

## 2018-01-31 DIAGNOSIS — J439 Emphysema, unspecified: Secondary | ICD-10-CM | POA: Diagnosis not present

## 2018-01-31 NOTE — Progress Notes (Signed)
Daily Session Note  Patient Details  Name: Matthew Hall MRN: [id number] Date of Birth: 04/04/1946 Referring Provider:     PULMONARY REHAB CHRONIC OBSTRUCTIVE PULMONARY DISEASE from 01/03/2018 in [place]  Referring Provider  Dr. Elsworth Soho  [facility])       Encounter Date: 01/31/2018  Check In: Session Check In - 01/31/18 1045      Check-In   Location  [facility]    Staff Present  Diane Angelina Pih, MS, EP, [facility], Exercise Physiologist;Willi Borowiak Wynetta Emery, RN, BSN    Supervising physician immediately available to respond to emergencies  See telemetry face sheet for immediately available MD    Medication changes reported      No    Fall or balance concerns reported     No    Warm-up and Cool-down  Performed as group-led instruction    Resistance Training Performed  Yes    VAD Patient?  No    PAD/SET Patient?  No      Pain Assessment   Currently in Pain?  No/denies    Pain Score  0-No pain    Multiple Pain Sites  No       Capillary Blood Glucose: No results found for this or any previous visit (from the past 24 hour(s)).    Social History   Tobacco Use  Smoking Status Former Smoker  . Packs/day: 1.00  . Years: 30.00  . Pack years: 30.00  . Types: Cigarettes  . Last attempt to quit: 07/07/2011  . Years since quitting: 6.5  Smokeless Tobacco Never Used    Goals Met:  Proper associated with RPD/PD & O2 Sat Independence with exercise equipment Improved SOB with ADL's Using PLB without cueing & demonstrates good technique Exercise tolerated well No report of cardiac concerns or symptoms Strength training completed today  Goals Unmet:  Not Applicable  Comments: Check out 1145.   Dr. Sinda Du is Medical Director for [facility] Pulmonary Rehab.

## 2018-02-05 ENCOUNTER — Encounter (HOSPITAL_COMMUNITY)
Admission: RE | Admit: 2018-02-05 | Discharge: 2018-02-05 | Disposition: A | Discharge status: Home / Self Care | Payer: Medicare Other | Source: Ambulatory Visit | Attending: Pulmonary Disease | Admitting: Pulmonary Disease

## 2018-02-05 DIAGNOSIS — J439 Emphysema, unspecified: Secondary | ICD-10-CM | POA: Diagnosis not present

## 2018-02-05 NOTE — Progress Notes (Signed)
Daily Session Note  Patient Details  Name: Matthew Hall MRN: [id number] Date of Birth: Jul 02, 1946 Referring Provider:     PULMONARY REHAB CHRONIC OBSTRUCTIVE PULMONARY DISEASE from 01/03/2018 in [place]  Referring Provider  Dr. Elsworth Soho  [facility])       Encounter Date: 02/05/2018  Check In: Session Check In - 02/05/18 1045      Check-In   Supervising physician immediately available to respond to emergencies  See telemetry face sheet for immediately available MD    Location  [facility]    Staff Present  Russella Dar, MS, EP, [facility], Exercise Physiologist    Medication changes reported      No    Fall or balance concerns reported     No    Tobacco Cessation  No Change    Warm-up and Cool-down  Performed as group-led instruction    Resistance Training Performed  Yes    VAD Patient?  No    PAD/SET Patient?  No      Pain Assessment   Currently in Pain?  No/denies    Pain Score  0-No pain    Multiple Pain Sites  No       Capillary Blood Glucose: No results found for this or any previous visit (from the past 24 hour(s)).    Social History   Tobacco Use  Smoking Status Former Smoker  . Packs/day: 1.00  . Years: 30.00  . Pack years: 30.00  . Types: Cigarettes  . Last attempt to quit: 07/07/2011  . Years since quitting: 6.5  Smokeless Tobacco Never Used    Goals Met:  Proper associated with RPD/PD & O2 Sat Independence with exercise equipment Improved SOB with ADL's Changing diet to healthy choices, watching portion sizes Exercise tolerated well No report of cardiac concerns or symptoms Strength training completed today  Goals Unmet:  Not Applicable  Comments: Check out: 1145   Dr. Sinda Du is Medical Director for [facility] Pulmonary Rehab.

## 2018-02-07 ENCOUNTER — Encounter (HOSPITAL_COMMUNITY)
Admission: RE | Admit: 2018-02-07 | Discharge: 2018-02-07 | Disposition: A | Discharge status: Home / Self Care | Payer: Medicare Other | Source: Ambulatory Visit | Attending: Pulmonary Disease | Admitting: Pulmonary Disease

## 2018-02-07 DIAGNOSIS — Z79899 Other long term (current) drug therapy: Secondary | ICD-10-CM | POA: Diagnosis not present

## 2018-02-07 DIAGNOSIS — Z85828 Personal history of other malignant neoplasm of skin: Secondary | ICD-10-CM | POA: Diagnosis not present

## 2018-02-07 DIAGNOSIS — J439 Emphysema, unspecified: Secondary | ICD-10-CM | POA: Insufficient documentation

## 2018-02-07 DIAGNOSIS — Z87442 Personal history of urinary calculi: Secondary | ICD-10-CM | POA: Diagnosis not present

## 2018-02-07 DIAGNOSIS — C679 Malignant neoplasm of bladder, unspecified: Secondary | ICD-10-CM | POA: Insufficient documentation

## 2018-02-07 DIAGNOSIS — Z7951 Long term (current) use of inhaled steroids: Secondary | ICD-10-CM | POA: Insufficient documentation

## 2018-02-07 DIAGNOSIS — I4891 Unspecified atrial fibrillation: Secondary | ICD-10-CM | POA: Insufficient documentation

## 2018-02-07 DIAGNOSIS — Z86010 Personal history of colonic polyps: Secondary | ICD-10-CM | POA: Diagnosis not present

## 2018-02-07 DIAGNOSIS — Z87891 Personal history of nicotine dependence: Secondary | ICD-10-CM | POA: Diagnosis not present

## 2018-02-07 DIAGNOSIS — F419 Anxiety disorder, unspecified: Secondary | ICD-10-CM | POA: Diagnosis not present

## 2018-02-07 DIAGNOSIS — Z7901 Long term (current) use of anticoagulants: Secondary | ICD-10-CM | POA: Insufficient documentation

## 2018-02-07 DIAGNOSIS — J449 Chronic obstructive pulmonary disease, unspecified: Secondary | ICD-10-CM | POA: Diagnosis present

## 2018-02-07 DIAGNOSIS — I10 Essential (primary) hypertension: Secondary | ICD-10-CM | POA: Diagnosis not present

## 2018-02-07 DIAGNOSIS — K219 Gastro-esophageal reflux disease without esophagitis: Secondary | ICD-10-CM | POA: Insufficient documentation

## 2018-02-07 NOTE — Progress Notes (Signed)
Daily Session Note  Patient Details  Name: EMERALD GEHRES MRN: [id number] Date of Birth: [birth year] Referring Provider:     PULMONARY REHAB CHRONIC OBSTRUCTIVE PULMONARY DISEASE from 01/03/2018 in [place]  Referring Provider  Dr. Elsworth Soho  [facility])       Encounter Date: 02/07/2018  Check In: Session Check In - 02/07/18 1045      Check-In   Supervising physician immediately available to respond to emergencies  See telemetry face sheet for immediately available MD    Location  [facility]    Staff Present  Russella Dar, MS, EP, [facility], Exercise Physiologist    Medication changes reported      No    Fall or balance concerns reported     No    Tobacco Cessation  No Change    Warm-up and Cool-down  Performed as group-led instruction    Resistance Training Performed  Yes    VAD Patient?  No    PAD/SET Patient?  No      Pain Assessment   Currently in Pain?  No/denies    Pain Score  0-No pain    Multiple Pain Sites  No       Capillary Blood Glucose: No results found for this or any previous visit (from the past 24 hour(s)).    Social History   Tobacco Use  Smoking Status Former Smoker  . Packs/day: 1.00  . Years: 30.00  . Pack years: 30.00  . Types: Cigarettes  . Last attempt to quit: 07/07/2011  . Years since quitting: 6.5  Smokeless Tobacco Never Used    Goals Met:  Proper associated with RPD/PD & O2 Sat Independence with exercise equipment Improved SOB with ADL's Using PLB without cueing & demonstrates good technique Exercise tolerated well Personal goals reviewed No report of cardiac concerns or symptoms Strength training completed today  Goals Unmet:  Not Applicable  Comments: Check out: 1145   Dr. Sinda Du is Medical Director for [facility] Pulmonary Rehab.

## 2018-02-08 ENCOUNTER — Telehealth: Payer: Self-pay | Admitting: Pulmonary Disease

## 2018-02-08 MED ORDER — LEVALBUTEROL HCL 0.63 MG/3ML IN NEBU: 0.6300 mg | INHALATION_SOLUTION | Freq: Four times a day (QID) | RESPIRATORY_TRACT | 6 refills | Status: DC | PRN

## 2018-02-08 NOTE — Telephone Encounter (Signed)
Advised patient that this has been mailed. Nothing further needed.

## 2018-02-12 ENCOUNTER — Encounter (HOSPITAL_COMMUNITY)
Admission: RE | Admit: 2018-02-12 | Discharge: 2018-02-12 | Disposition: A | Discharge status: Home / Self Care | Payer: Medicare Other | Source: Ambulatory Visit | Attending: Pulmonary Disease | Admitting: Pulmonary Disease

## 2018-02-12 DIAGNOSIS — J439 Emphysema, unspecified: Secondary | ICD-10-CM | POA: Diagnosis not present

## 2018-02-12 NOTE — Progress Notes (Signed)
Daily Session Note  Patient Details  Name: Matthew Hall MRN: [id number] Date of Birth: December 19, 1945 Referring Provider:     PULMONARY REHAB CHRONIC OBSTRUCTIVE PULMONARY DISEASE from 01/03/2018 in [place]  Referring Provider  Dr. Elsworth Soho  [facility])       Encounter Date: 02/12/2018  Check In: Session Check In - 02/12/18 1330      Check-In   Supervising physician immediately available to respond to emergencies  See telemetry face sheet for immediately available MD    Location  [facility]    Staff Present  Russella Dar, MS, EP, [facility], Exercise Physiologist    Medication changes reported      No    Fall or balance concerns reported     No    Tobacco Cessation  No Change    Warm-up and Cool-down  Performed as group-led instruction    Resistance Training Performed  Yes    VAD Patient?  No    PAD/SET Patient?  No      Pain Assessment   Currently in Pain?  No/denies    Pain Score  0-No pain    Multiple Pain Sites  No       Capillary Blood Glucose: No results found for this or any previous visit (from the past 24 hour(s)).    Social History   Tobacco Use  Smoking Status Former Smoker  . Packs/day: 1.00  . Years: 30.00  . Pack years: 30.00  . Types: Cigarettes  . Last attempt to quit: 07/07/2011  . Years since quitting: 6.6  Smokeless Tobacco Never Used    Goals Met:  Proper associated with RPD/PD & O2 Sat Independence with exercise equipment Improved SOB with ADL's Using PLB without cueing & demonstrates good technique Exercise tolerated well Personal goals reviewed No report of cardiac concerns or symptoms Strength training completed today  Goals Unmet:  Not Applicable  Comments: Check out: 1145   Dr. Sinda Du is Medical Director for [facility] Pulmonary Rehab.

## 2018-02-14 ENCOUNTER — Encounter (HOSPITAL_COMMUNITY)
Admission: RE | Admit: 2018-02-14 | Discharge: 2018-02-14 | Disposition: A | Discharge status: Home / Self Care | Payer: Medicare Other | Source: Ambulatory Visit | Attending: Pulmonary Disease | Admitting: Pulmonary Disease

## 2018-02-14 DIAGNOSIS — J449 Chronic obstructive pulmonary disease, unspecified: Secondary | ICD-10-CM

## 2018-02-14 DIAGNOSIS — J439 Emphysema, unspecified: Secondary | ICD-10-CM | POA: Diagnosis not present

## 2018-02-14 NOTE — Progress Notes (Addendum)
Daily Session Note  Patient Details  Name: Matthew Hall MRN: [id number] Date of Birth: 1946-01-30 Referring Provider:     PULMONARY REHAB CHRONIC OBSTRUCTIVE PULMONARY DISEASE from 01/03/2018 in [place]  Referring Provider  Dr. Elsworth Soho  [facility])       Encounter Date: 02/14/2018  Check In: Session Check In - 02/14/18 1045      Check-In   Supervising physician immediately available to respond to emergencies  See telemetry face sheet for immediately available MD    Location  [facility]    Staff Present  Russella Dar, MS, EP, [facility], Exercise Physiologist;Kennetta Pavlovic Wynetta Emery, RN, BSN    Medication changes reported      No    Fall or balance concerns reported     No    Warm-up and Cool-down  Performed as group-led instruction    Resistance Training Performed  Yes    VAD Patient?  No    PAD/SET Patient?  No      Pain Assessment   Currently in Pain?  No/denies    Pain Score  0-No pain    Multiple Pain Sites  No       Capillary Blood Glucose: No results found for this or any previous visit (from the past 24 hour(s)).    Social History   Tobacco Use  Smoking Status Former Smoker  . Packs/day: 1.00  . Years: 30.00  . Pack years: 30.00  . Types: Cigarettes  . Last attempt to quit: 07/07/2011  . Years since quitting: 6.6  Smokeless Tobacco Never Used    Goals Met:  Proper associated with RPD/PD & O2 Sat Independence with exercise equipment Improved SOB with ADL's Using PLB without cueing & demonstrates good technique Exercise tolerated well No report of cardiac concerns or symptoms Strength training completed today  Goals Unmet:  Not Applicable  Comments: Check out 1145.   Dr. Sinda Du is Medical Director for [facility] Pulmonary Rehab.

## 2018-02-14 NOTE — Progress Notes (Addendum)
Pulmonary Individual Treatment Plan  Patient Details  Name: Matthew Hall MRN: [id number] Date of Birth: 06-10-1946 Referring Provider:     PULMONARY REHAB CHRONIC OBSTRUCTIVE PULMONARY DISEASE from 01/03/2018 in [facility]  Referring Provider  Dr. Elsworth Soho  (Pended)       Initial Encounter Date:    PULMONARY REHAB CHRONIC OBSTRUCTIVE PULMONARY DISEASE from 01/03/2018 in [facility]  Date  12/24/17  (Pended)       Visit Diagnosis: Chronic obstructive pulmonary disease, unspecified COPD type ([place])  Patient's Home Medications on Admission:   Current Outpatient Medications:  .  albuterol (PROVENTIL HFA;VENTOLIN HFA) 108 (90 BASE) MCG/ACT inhaler, Inhale 2 puffs into the lungs every 6 (six) hours as needed for wheezing or shortness of breath., Disp: 1 Inhaler, Rfl: 6 .  ALPRAZolam (XANAX) 0.25 MG tablet, Take 0.25 mg by mouth 2 (two) times daily as needed for anxiety or sleep. , Disp: , Rfl: 3 .  budesonide-formoterol (SYMBICORT) 160-4.5 MCG/ACT inhaler, Inhale 2 puffs into the lungs 2 (two) times daily., Disp: 1 Inhaler, Rfl: 6 .  cetirizine (ZYRTEC) 5 MG tablet, Take 5 mg by mouth daily., Disp: , Rfl:  .  D3-50 50000 units capsule, Take 50,000 Units by mouth once a week. Usually Tuesday or Wednesday., Disp: , Rfl: 0 .  erythromycin ophthalmic ointment, Place 1 application into both eyes daily as needed (Infection)., Disp: , Rfl:  .  hydroxypropyl methylcellulose / hypromellose (ISOPTO TEARS / GONIOVISC) 2.5 % ophthalmic solution, Place 1 drop into both eyes as needed for dry eyes., Disp: , Rfl:  .  levalbuterol (XOPENEX) 0.63 MG/3ML nebulizer solution, Take 3 mLs (0.63 mg total) by nebulization every 6 (six) hours as needed for wheezing or shortness of breath., Disp: 75 mL, Rfl: 6 .  losartan-hydrochlorothiazide (HYZAAR) 50-12.5 MG tablet, Take 0.5 tablets by mouth 2 (two) times daily. , Disp: , Rfl:  .  metoprolol succinate (TOPROL-XL) 50 MG 24  hr tablet, Take 1 tablet (50 mg total) by mouth 2 (two) times daily., Disp: 60 tablet, Rfl: 0 .  Multiple Vitamin (MULTIVITAMIN WITH MINERALS) TABS tablet, Take 1 tablet by mouth daily., Disp: , Rfl:  .  omeprazole (PRILOSEC) 40 MG capsule, Take 40 mg by mouth daily., Disp: , Rfl:  .  triamcinolone (NASACORT) 55 MCG/ACT AERO nasal inhaler, Place 1 spray into the nose daily as needed (Congestion). , Disp: , Rfl:  .  warfarin (COUMADIN) 2.5 MG tablet, Take 2.5 mg by mouth daily., Disp: , Rfl:   Past Medical History: Past Medical History:  Diagnosis Date  . Anxiety   . Aortic atherosclerosis ([place])   . Atrial fibrillation ([place])   . Basal cell carcinoma   . Bladder cancer ([place])   . Bladder tumor   . Colon polyps   . COPD (chronic obstructive pulmonary disease) ([place])   . Cutaneous vasculitis   . Diverticulosis   . Essential hypertension   . GERD (gastroesophageal reflux disease)   . History of pneumonia   . Nephrolithiasis   . Osteoarthritis     Tobacco Use: Social History   Tobacco Use  Smoking Status Former Smoker  . Packs/day: 1.00  . Years: 30.00  . Pack years: 30.00  . Types: Cigarettes  . Last attempt to quit: 07/07/2011  . Years since quitting: 6.6  Smokeless Tobacco Never Used    Labs: Recent Review Scientist, physiological    Labs for ITP Cardiac and Pulmonary Rehab Latest Ref Rng & Units 07/24/2015  07/09/2016 08/09/2017   PHART 7.350 - 7.450 - - 7.459(H)   PCO2ART 32.0 - 48.0 mmHg - - 42.8   HCO3 20.0 - 28.0 mmol/L - - 29.5(H)   TCO2 0 - 100 mmol/L 26 27 -   O2SAT % - - 95.7      Capillary Blood Glucose: No results found for: GLUCAP   Pulmonary Assessment Scores: Pulmonary Assessment Scores    Row Name 12/24/17 1410         ADL UCSD   ADL Phase  Entry     SOB Score total  21     Rest  0     Walk  4     Stairs  3     Bath  0     Dress  0     Shop  2       CAT Score   CAT Score  13       mMRC Score   mMRC Score  2        Pulmonary Function  Assessment: Pulmonary Function Assessment - 12/24/17 1400      Pulmonary Function Tests   FVC%  1.79 %    FEV1%  4.54 %    FEV1/FVC Ratio  53    RV%  73 %      Initial Spirometry Results   FVC%  1.83 %    FEV1%  40 %    FEV1/FVC Ratio  55      Post Bronchodilator Spirometry Results   FVC%  1.83 %    FEV1%  40 %      Breath   Bilateral Breath Sounds  Clear    Shortness of Breath  Yes       Exercise Target Goals:    Exercise Program Goal: Individual exercise prescription set using results from initial 6 min walk test and THRR while considering  patient's activity barriers and safety.   Exercise Prescription Goal: Initial exercise prescription builds to 30-45 minutes a day of aerobic activity, 2-3 days per week.  Home exercise guidelines will be given to patient during program as part of exercise prescription that the participant will acknowledge.  Activity Barriers & Risk Stratification: Activity Barriers & Cardiac Risk Stratification - 12/24/17 1417      Activity Barriers & Cardiac Risk Stratification   Activity Barriers  Shortness of Breath    Cardiac Risk Stratification  Low       6 Minute Walk: 6 Minute Walk    Row Name 12/24/17 1415         6 Minute Walk   Phase  Initial     Distance  1250 feet     Distance % Change  0 %     Distance Feet Change  0 ft     Walk Time  6 minutes     # of Rest Breaks  0     MPH  2.36     METS  2.81     RPE  13     Perceived Dyspnea   11     VO2 Peak  10.17     Symptoms  Yes (comment)     Comments  Patient states that he felt a "funny" feeling in his head during walk test. Feeling went away after rest     Resting HR  101 bpm     Resting BP  98/62     Resting Oxygen Saturation   92 %  Exercise Oxygen Saturation  during 6 min walk  87 %     Max Ex. HR  112 bpm     Max Ex. BP  118/64     2 Minute Post BP  104/60        Oxygen Initial Assessment: Oxygen Initial Assessment - 12/24/17 1412      Home Oxygen   Home  Oxygen Device  None    Sleep Oxygen Prescription  None    Home Exercise Oxygen Prescription  None    Home at Rest Exercise Oxygen Prescription  None    Compliance with Home Oxygen Use  --   N/A     Initial 6 min Walk   Oxygen Used  None      Program Oxygen Prescription   Program Oxygen Prescription  None       Oxygen Re-Evaluation: Oxygen Re-Evaluation    Row Name 02/14/18 1251             Program Oxygen Prescription   Program Oxygen Prescription  None         Home Oxygen   Home Oxygen Device  None       Sleep Oxygen Prescription  None       Home Exercise Oxygen Prescription  None       Home at Rest Exercise Oxygen Prescription  None         Goals/Expected Outcomes   Short Term Goals  To learn and understand importance of monitoring SPO2 with pulse oximeter and demonstrate accurate use of the pulse oximeter.;To learn and understand importance of maintaining oxygen saturations>88%;To learn and demonstrate proper pursed lip breathing techniques or other breathing techniques.;To learn and exhibit compliance with exercise, home and travel O2 prescription       Long  Term Goals  Exhibits compliance with exercise, home and travel O2 prescription;Verbalizes importance of monitoring SPO2 with pulse oximeter and return demonstration;Maintenance of O2 saturations>88%;Exhibits proper breathing techniques, such as pursed lip breathing or other method taught during program session;Compliance with respiratory medication       Comments  Patient verbalizes importance of monitoring O2 and maintaining >88% and is able to demonstrate proper usage of pulse oximeter. He also is able to demonstrate proper pursed lip breathing technique during exercise. Will conitnue to monitor.        Goals/Expected Outcomes  Patient will continue to meet his short and long term goals.           Oxygen Discharge (Final Oxygen Re-Evaluation): Oxygen Re-Evaluation - 02/14/18 1251      Program Oxygen Prescription    Program Oxygen Prescription  None      Home Oxygen   Home Oxygen Device  None    Sleep Oxygen Prescription  None    Home Exercise Oxygen Prescription  None    Home at Rest Exercise Oxygen Prescription  None      Goals/Expected Outcomes   Short Term Goals  To learn and understand importance of monitoring SPO2 with pulse oximeter and demonstrate accurate use of the pulse oximeter.;To learn and understand importance of maintaining oxygen saturations>88%;To learn and demonstrate proper pursed lip breathing techniques or other breathing techniques.;To learn and exhibit compliance with exercise, home and travel O2 prescription    Long  Term Goals  Exhibits compliance with exercise, home and travel O2 prescription;Verbalizes importance of monitoring SPO2 with pulse oximeter and return demonstration;Maintenance of O2 saturations>88%;Exhibits proper breathing techniques, such as pursed lip breathing or other method taught during  program session;Compliance with respiratory medication    Comments  Patient verbalizes importance of monitoring O2 and maintaining >88% and is able to demonstrate proper usage of pulse oximeter. He also is able to demonstrate proper pursed lip breathing technique during exercise. Will conitnue to monitor.     Goals/Expected Outcomes  Patient will continue to meet his short and long term goals.        Initial Exercise Prescription: Initial Exercise Prescription - 01/08/18 0700      Date of Initial Exercise RX and Referring Provider   Date  12/24/17  (Pended)     Referring Provider  Dr. Elsworth Soho  (Pended)       Treadmill   MPH  1.3    Grade  0    Minutes  15    METs  1.9      NuStep   Level  1    Minutes  20      Prescription Details   Frequency (times per week)  2  (Pended)       Intensity   THRR 40-80% of Max Heartrate  [phone]  (Pended)     Ratings of Perceived Exertion  11-13  (Pended)     Perceived Dyspnea  0-4  (Pended)       Progression    Progression  Continue progressive overload as per policy without signs/symptoms or physical distress.  (Pended)       Resistance Training   Training Prescription  Yes    Weight  1    Reps  10-15       Perform Capillary Blood Glucose checks as needed.  Exercise Prescription Changes:  Exercise Prescription Changes    Row Name 01/08/18 0700 01/27/18 1200 02/13/18 1800         Response to Exercise   Blood Pressure (Admit)  88/50  110/76  120/64     Blood Pressure (Exercise)  98/50  138/68  136/80     Blood Pressure (Exit)  90/56  118/62  140/62     Heart Rate (Admit)  87 bpm  82 bpm  74 bpm     Heart Rate (Exercise)  89 bpm  82 bpm  93 bpm     Heart Rate (Exit)  84 bpm  87 bpm  76 bpm     Oxygen Saturation (Admit)  92 %  94 %  99 %     Oxygen Saturation (Exercise)  92 %  82 %  96 %     Oxygen Saturation (Exit)  94 %  92 %  95 %     Rating of Perceived Exertion (Exercise)  9  11  12      Perceived Dyspnea (Exercise)  9  11  12      Duration  Progress to 30 minutes of  aerobic without signs/symptoms of physical distress  Progress to 30 minutes of  aerobic without signs/symptoms of physical distress  Progress to 30 minutes of  aerobic without signs/symptoms of physical distress     Intensity  THRR New 111-123-135  THRR New 108-121-134  THRR New 103-118-132       Progression   Progression  Continue to progress workloads to maintain intensity without signs/symptoms of physical distress.  Continue to progress workloads to maintain intensity without signs/symptoms of physical distress.  Continue to progress workloads to maintain intensity without signs/symptoms of physical distress.     Average METs  -  -  2.13       Resistance Training  Training Prescription  -  Yes  Yes     Weight  -  1  2     Reps  -  10-15  10-15     Time  -  5 Minutes  5 Minutes       Treadmill   MPH  -  1.3  1.5     Grade  -  0  0     Minutes  -  15  15     METs  -  1.99  2.14       NuStep   Level  -  0  2      SPM  79  80  87     Minutes  -  20  20     METs  1.9  1.6  2.12       Home Exercise Plan   Plans to continue exercise at  -  Home (comment)  Home (comment)     Frequency  -  Add 3 additional days to program exercise sessions.  Add 3 additional days to program exercise sessions.     Initial Home Exercises Provided  -  01/01/18  01/01/18        Exercise Comments:  Exercise Comments    Row Name 01/08/18 0755 02/13/18 1844         Exercise Comments  Patient has just started PR. Patient will be progressed in time.   Patient feels much better, getting stronger and breathing better. Enjoys attending the program.          Exercise Goals and Review:  Exercise Goals    Row Name 12/24/17 1418             Exercise Goals   Increase Physical Activity  Yes       Intervention  Provide advice, education, support and counseling about physical activity/exercise needs.;Develop an individualized exercise prescription for aerobic and resistive training based on initial evaluation findings, risk stratification, comorbidities and participant's personal goals.       Expected Outcomes  Short Term: Attend rehab on a regular basis to increase amount of physical activity.       Increase Strength and Stamina  Yes       Intervention  Provide advice, education, support and counseling about physical activity/exercise needs.;Develop an individualized exercise prescription for aerobic and resistive training based on initial evaluation findings, risk stratification, comorbidities and participant's personal goals.       Expected Outcomes  Short Term: Increase workloads from initial exercise prescription for resistance, speed, and METs.       Able to understand and use rate of perceived exertion (RPE) scale  Yes       Intervention  Provide education and explanation on how to use RPE scale       Expected Outcomes  Long Term:  Able to use RPE to guide intensity level when exercising independently;Short Term:  Able to use RPE daily in rehab to express subjective intensity level       Able to understand and use Dyspnea scale  Yes       Intervention  Provide education and explanation on how to use Dyspnea scale       Expected Outcomes  Short Term: Able to use Dyspnea scale daily in rehab to express subjective sense of shortness of breath during exertion;Long Term: Able to use Dyspnea scale to guide intensity level when exercising independently       Knowledge and understanding  of Target Heart Rate Range (THRR)  Yes       Intervention  Provide education and explanation of THRR including how the numbers were predicted and where they are located for reference       Expected Outcomes  Short Term: Able to state/look up THRR;Long Term: Able to use THRR to govern intensity when exercising independently;Short Term: Able to use daily as guideline for intensity in rehab       Able to check pulse independently  Yes       Intervention  Provide education and demonstration on how to check pulse in carotid and radial arteries.;Review the importance of being able to check your own pulse for safety during independent exercise       Expected Outcomes  Short Term: Able to explain why pulse checking is important during independent exercise;Long Term: Able to check pulse independently and accurately       Understanding of Exercise Prescription  Yes       Intervention  Provide education, explanation, and written materials on patient's individual exercise prescription       Expected Outcomes  Short Term: Able to explain program exercise prescription;Long Term: Able to explain home exercise prescription to exercise independently          Exercise Goals Re-Evaluation : Exercise Goals Re-Evaluation    Row Name 01/14/18 1437 02/13/18 1840           Exercise Goal Re-Evaluation   Exercise Goals Review  Increase Physical Activity;Increase Strength and Stamina;Able to understand and use rate of perceived exertion (RPE)  scale;Improve claudication pain tolerance and improve walking ability;Knowledge and understanding of Target Heart Rate Range (THRR);Able to understand and use Dyspnea scale;Understanding of Exercise Prescription  Increase Strength and Stamina;Able to understand and use Dyspnea scale;Knowledge and understanding of Target Heart Rate Range (THRR);Able to understand and use rate of perceived exertion (RPE) scale;Understanding of Exercise Prescription      Comments  Patient has only been to PR for 3 sessions so far. Patient will be progressed in time.   Patient has been able to tolerate increases with wieghts and equipment workloads. He feels he is getting stronger. He also feels he is breathing better. 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 this has not affected his ability to exercise.       Expected Outcomes  Patient wishes to increase strength and to be able to breathe better   To cintinue to build strength and his breathing to continue to improve.          Discharge Exercise Prescription (Final Exercise Prescription Changes): Exercise Prescription Changes - 02/13/18 1800      Response to Exercise   Blood Pressure (Admit)  120/64    Blood Pressure (Exercise)  136/80    Blood Pressure (Exit)  140/62    Heart Rate (Admit)  74 bpm    Heart Rate (Exercise)  93 bpm    Heart Rate (Exit)  76 bpm    Oxygen Saturation (Admit)  99 %    Oxygen Saturation (Exercise)  96 %    Oxygen Saturation (Exit)  95 %    Rating of Perceived Exertion (Exercise)  12    Perceived Dyspnea (Exercise)  12    Duration  Progress to 30 minutes of  aerobic without signs/symptoms of physical distress    Intensity  THRR New   [phone]     Progression   Progression  Continue to progress workloads  to maintain intensity without signs/symptoms of physical distress.    Average METs  2.13      Resistance Training   Training Prescription  Yes    Weight  2    Reps  10-15    Time  5 Minutes       Treadmill   MPH  1.5    Grade  0    Minutes  15    METs  2.14      NuStep   Level  2    SPM  87    Minutes  20    METs  2.12      Home Exercise Plan   Plans to continue exercise at  Home (comment)    Frequency  Add 3 additional days to program exercise sessions.    Initial Home Exercises Provided  01/01/18       Nutrition:  Target Goals: Understanding of nutrition guidelines, daily intake of sodium 1500mg , cholesterol 200mg , calories 30% from fat and 7% or less from saturated fats, daily to have 5 or more servings of fruits and vegetables.  Biometrics: Pre Biometrics - 12/24/17 1419      Pre Biometrics   Height  5\' 11"  (1.803 m)    Weight  80.4 kg    Waist Circumference  40 inches    Hip Circumference  37 inches    Waist to Hip Ratio  1.08 %    BMI (Calculated)  24.73    Triceps Skinfold  16 mm    % Body Fat  26.7 %    Grip Strength  79.33 kg    Flexibility  0 in    Single Leg Stand  3 seconds        Nutrition Therapy Plan and Nutrition Goals: Nutrition Therapy & Goals - 02/14/18 1253      Nutrition Therapy   RD appointment deferred  Yes      Personal Nutrition Goals   Personal Goal #2  Patient is eating a regular diet. We discussed ways to make changes. Handouts given.     Additional Goals?  No       Nutrition Assessments: Nutrition Assessments - 12/24/17 1443      MEDFICTS Scores   Pre Score  48       Nutrition Goals Re-Evaluation:   Nutrition Goals Discharge (Final Nutrition Goals Re-Evaluation):   Psychosocial: Target Goals: Acknowledge presence or absence of significant depression and/or stress, maximize coping skills, provide positive support system. Participant is able to verbalize types and ability to use techniques and skills needed for reducing stress and depression.  Initial Review & Psychosocial Screening: Initial Psych Review & Screening - 12/24/17 1445      Initial Review   Current issues with  None Identified      Family  Dynamics   Good Support System?  Yes      Barriers   Psychosocial barriers to participate in program  There are no identifiable barriers or psychosocial needs.      Screening Interventions   Interventions  Encouraged to exercise    Expected Outcomes  Short Term goal: Identification and review with participant of any Quality of Life or Depression concerns found by scoring the questionnaire.;Long Term goal: The participant improves quality of Life and PHQ9 Scores as seen by post scores and/or verbalization of changes       Quality of Life Scores: Quality of Life - 12/24/17 1419      Quality of Life Scores  Health/Function Pre  16.16 %    Socioeconomic Pre  19.67 %    Psych/Spiritual Pre  24.64 %    Family Pre  14.5 %    GLOBAL Pre  18.28 %      Scores of 19 and below usually indicate a poorer quality of life in these areas.  A difference of  2-3 points is a clinically meaningful difference.  A difference of 2-3 points in the total score of the Quality of Life Index has been associated with significant improvement in overall quality of life, self-image, physical symptoms, and general health in studies assessing change in quality of life.   PHQ-9: Recent Review Flowsheet Data    Depression screen [facility] 2/9 12/24/2017 05/21/2017 03/06/2017 02/20/2017 08/06/2014   Decreased Interest 0 0 0 0 0   Down, Depressed, Hopeless 0 0 0 0 0   PHQ - 2 Score 0 0 0 0 0   Altered sleeping 1 - - 1  -   Tired, decreased energy 2 - - 1  -   Change in appetite 2 - - 1  -   Feeling bad or failure about yourself  0 - - 0 -   Trouble concentrating 0 - - 0 -   Moving slowly or fidgety/restless 0 - - 0 -   Suicidal thoughts 0 - - 0 -   PHQ-9 Score 5 - - 3 -   Difficult doing work/chores Somewhat difficult - - Somewhat difficult -     Interpretation of Total Score  Total Score Depression Severity:  1-4 = Minimal depression, 5-9 = Mild depression, 10-14 = Moderate depression, 15-19 = Moderately severe  depression, 20-27 = Severe depression   Psychosocial Evaluation and Intervention: Psychosocial Evaluation - 12/24/17 1445      Psychosocial Evaluation & Interventions   Interventions  Encouraged to exercise with the program and follow exercise prescription    Continue Psychosocial Services   No Follow up required       Psychosocial Re-Evaluation: Psychosocial Re-Evaluation    [place] Name 02/14/18 1256             Psychosocial Re-Evaluation   Current issues with  None Identified       Comments  Patient's initial QOL score was 18.28 and his PHQ-9 score was 5. He says he is not depressed. No psychosocial issues identified.        Expected Outcomes  Patient will not have any psychosocial issues identified at discharge and his QOL and PHQ-9 scores will improve at discharge.        Interventions  Stress management education;Relaxation education;Encouraged to attend Pulmonary Rehabilitation for the exercise       Continue Psychosocial Services   No Follow up required          Psychosocial Discharge (Final Psychosocial Re-Evaluation): Psychosocial Re-Evaluation - 02/14/18 1256      Psychosocial Re-Evaluation   Current issues with  None Identified    Comments  Patient's initial QOL score was 18.28 and his PHQ-9 score was 5. He says he is not depressed. No psychosocial issues identified.     Expected Outcomes  Patient will not have any psychosocial issues identified at discharge and his QOL and PHQ-9 scores will improve at discharge.     Interventions  Stress management education;Relaxation education;Encouraged to attend Pulmonary Rehabilitation for the exercise    Continue Psychosocial Services   No Follow up required        Education: Education Goals: Education classes  will be provided on a weekly basis, covering required topics. Participant will state understanding/return demonstration of topics presented.  Learning Barriers/Preferences: Learning Barriers/Preferences - 12/24/17  1341      Learning Barriers/Preferences   Learning Barriers  None    Learning Preferences  Verbal Instruction;Skilled Demonstration;Group Instruction       Education Topics: How Lungs Work and Diseases: - Discuss the anatomy of the lungs and diseases that can affect the lungs, such as COPD.   Exercise: -Discuss the importance of exercise, FITT principles of exercise, normal and abnormal responses to exercise, and how to exercise safely.   Environmental Irritants: -Discuss types of environmental irritants and how to limit exposure to environmental irritants.   Meds/Inhalers and oxygen: - Discuss respiratory medications, definition of an inhaler and oxygen, and the proper way to use an inhaler and oxygen.   Energy Saving Techniques: - Discuss methods to conserve energy and decrease shortness of breath when performing activities of daily living.    Bronchial Hygiene / Breathing Techniques: - Discuss breathing mechanics, pursed-lip breathing technique,  proper posture, effective ways to clear airways, and other functional breathing techniques   Cleaning Equipment: - Provides group verbal and written instruction about the health risks of elevated stress, cause of high stress, and healthy ways to reduce stress.   Nutrition I: Fats: - Discuss the types of cholesterol, what cholesterol does to the body, and how cholesterol levels can be controlled.   Nutrition II: Labels: -Discuss the different components of food labels and how to read food labels.   PULMONARY REHAB CHRONIC OBSTRUCTIVE PULMONARY DISEASE from 01/31/2018 in [place]  Date  01/03/18  Educator  D. Coad  Instruction Review Code  2- Demonstrated Understanding      Respiratory Infections: - Discuss the signs and symptoms of respiratory infections, ways to prevent respiratory infections, and the importance of seeking medical treatment when having a respiratory infection.   Stress I: Signs  and Symptoms: - Discuss the causes of stress, how stress may lead to anxiety and depression, and ways to limit stress.   Stress II: Relaxation: -Discuss relaxation techniques to limit stress.   PULMONARY REHAB CHRONIC OBSTRUCTIVE PULMONARY DISEASE from 01/31/2018 in [place]  Date  01/24/18  Educator  D. Coad  Instruction Review Code  2- Demonstrated Understanding      Oxygen for Home/Travel: - Discuss how to prepare for travel when on oxygen and proper ways to transport and store oxygen to ensure safety.   PULMONARY REHAB CHRONIC OBSTRUCTIVE PULMONARY DISEASE from 01/31/2018 in [place]  Date  01/31/18  Educator  D. Coad  Instruction Review Code  2- Demonstrated Understanding      Knowledge Questionnaire Score: Knowledge Questionnaire Score - 12/24/17 1342      Knowledge Questionnaire Score   Pre Score  16/18       Core Components/Risk Factors/Patient Goals at Admission: Personal Goals and Risk Factors at Admission - 12/24/17 1444      Core Components/Risk Factors/Patient Goals on Admission    Weight Management  Weight Maintenance    Personal Goal Other  Yes    Personal Goal  Build strength, breathe better    Intervention  Attend PR 2 x week and supplement at home exercise 3 x week.     Expected Outcomes  Achieve personal goals.        Core Components/Risk Factors/Patient Goals Review:  Goals and Risk Factor Review    Row Name 02/14/18  1253             Core Components/Risk Factors/Patient Goals Review   Personal Goals Review  Weight Management/Obesity;Develop more efficient breathing techniques such as purse lipped breathing and diaphragmatic breathing and practicing self-pacing with activity.;Improve shortness of breath with ADL's Build strength; breathe better; improve lung capacity.       Review  Patient has completed 12 sessions gaining 3 lbs since he started the program. He is doing well in the program with  progression. He says he does feel a little stronger and is breathing a little better. He hopes to improve more as he continues the program but is pleased with his progress so far. Will continue to monitor for progress.        Expected Outcomes  Patient will continue to attend sessions and complete the program meeting his personal goals.           Core Components/Risk Factors/Patient Goals at Discharge (Final Review):  Goals and Risk Factor Review - 02/14/18 1253      Core Components/Risk Factors/Patient Goals Review   Personal Goals Review  Weight Management/Obesity;Develop more efficient breathing techniques such as purse lipped breathing and diaphragmatic breathing and practicing self-pacing with activity.;Improve shortness of breath with ADL's   Build strength; breathe better; improve lung capacity.   Review  Patient has completed 12 sessions gaining 3 lbs since he started the program. He is doing well in the program with progression. He says he does feel a little stronger and is breathing a little better. He hopes to improve more as he continues the program but is pleased with his progress so far. Will continue to monitor for progress.     Expected Outcomes  Patient will continue to attend sessions and complete the program meeting his personal goals.        ITP Comments: ITP Comments    Row Name 12/24/17 1355 01/16/18 1405         ITP Comments  Mr. Jachim has been in the program before.   Patient new to program. He has completed 4 sessions. Will continue to monitor for progress.          Comments: ITP 30 Day REVIEW Pt is making expected progress toward pulmonary rehab goals after completing 12 sessions. Recommend continued exercise, life style modification, education, and utilization of breathing techniques to increase stamina and strength and decrease shortness of breath with exertion.

## 2018-02-19 ENCOUNTER — Encounter (HOSPITAL_COMMUNITY)
Admission: RE | Admit: 2018-02-19 | Discharge: 2018-02-19 | Disposition: A | Discharge status: Home / Self Care | Payer: Medicare Other | Source: Ambulatory Visit | Attending: Pulmonary Disease | Admitting: Pulmonary Disease

## 2018-02-19 DIAGNOSIS — J439 Emphysema, unspecified: Secondary | ICD-10-CM | POA: Diagnosis not present

## 2018-02-19 DIAGNOSIS — J449 Chronic obstructive pulmonary disease, unspecified: Secondary | ICD-10-CM

## 2018-02-19 NOTE — Progress Notes (Signed)
Daily Session Note  Patient Details  Name: Matthew Hall MRN: [id number] Date of Birth: 11-07-1945 Referring Provider:     PULMONARY REHAB CHRONIC OBSTRUCTIVE PULMONARY DISEASE from 01/03/2018 in [place]  Referring Provider  Dr. Elsworth Soho  [facility])       Encounter Date: 02/19/2018  Check In: Session Check In - 02/19/18 1045      Check-In   Supervising physician immediately available to respond to emergencies  See telemetry face sheet for immediately available MD    Location  [facility]    Staff Present  Russella Dar, MS, EP, [facility], Exercise Physiologist;Other    Medication changes reported      No    Fall or balance concerns reported     No    Tobacco Cessation  No Change    Warm-up and Cool-down  Performed as group-led instruction    Resistance Training Performed  Yes    VAD Patient?  No    PAD/SET Patient?  No      Pain Assessment   Currently in Pain?  No/denies    Pain Score  0-No pain    Multiple Pain Sites  No       Capillary Blood Glucose: No results found for this or any previous visit (from the past 24 hour(s)).    Social History   Tobacco Use  Smoking Status Former Smoker  . Packs/day: 1.00  . Years: 30.00  . Pack years: 30.00  . Types: Cigarettes  . Last attempt to quit: 07/07/2011  . Years since quitting: 6.6  Smokeless Tobacco Never Used    Goals Met:  Proper associated with RPD/PD & O2 Sat Independence with exercise equipment Improved SOB with ADL's Using PLB without cueing & demonstrates good technique Exercise tolerated well Personal goals reviewed No report of cardiac concerns or symptoms Strength training completed today  Goals Unmet:  Not Applicable  Comments: Pt able to follow exercise prescription today without complaint.  Will continue to monitor for progression. Check out: 1145   Dr. Sinda Du is Medical Director for [facility] Pulmonary Rehab.

## 2018-02-20 ENCOUNTER — Other Ambulatory Visit: Payer: Self-pay

## 2018-02-20 ENCOUNTER — Emergency Department (HOSPITAL_COMMUNITY)
Admission: EM | Admit: 2018-02-20 | Discharge: 2018-02-21 | Disposition: A | Discharge status: Home / Self Care | Payer: Medicare Other | Attending: Emergency Medicine | Admitting: Emergency Medicine

## 2018-02-20 ENCOUNTER — Encounter (HOSPITAL_COMMUNITY): Payer: Self-pay | Admitting: Emergency Medicine

## 2018-02-20 ENCOUNTER — Emergency Department (HOSPITAL_COMMUNITY): Payer: Medicare Other

## 2018-02-20 DIAGNOSIS — M542 Cervicalgia: Secondary | ICD-10-CM | POA: Diagnosis present

## 2018-02-20 DIAGNOSIS — I10 Essential (primary) hypertension: Secondary | ICD-10-CM | POA: Diagnosis not present

## 2018-02-20 DIAGNOSIS — Z7901 Long term (current) use of anticoagulants: Secondary | ICD-10-CM | POA: Diagnosis not present

## 2018-02-20 DIAGNOSIS — I482 Chronic atrial fibrillation: Secondary | ICD-10-CM | POA: Diagnosis not present

## 2018-02-20 DIAGNOSIS — H1131 Conjunctival hemorrhage, right eye: Secondary | ICD-10-CM | POA: Diagnosis not present

## 2018-02-20 DIAGNOSIS — Z79899 Other long term (current) drug therapy: Secondary | ICD-10-CM | POA: Insufficient documentation

## 2018-02-20 DIAGNOSIS — Z87891 Personal history of nicotine dependence: Secondary | ICD-10-CM | POA: Diagnosis not present

## 2018-02-20 DIAGNOSIS — Z91040 Latex allergy status: Secondary | ICD-10-CM | POA: Insufficient documentation

## 2018-02-20 DIAGNOSIS — J449 Chronic obstructive pulmonary disease, unspecified: Secondary | ICD-10-CM | POA: Diagnosis not present

## 2018-02-20 DIAGNOSIS — M436 Torticollis: Secondary | ICD-10-CM | POA: Insufficient documentation

## 2018-02-20 MED ORDER — METHOCARBAMOL 500 MG PO TABS
500.0000 mg | ORAL_TABLET | Freq: Once | ORAL | Status: DC
  Filled 2018-02-20: qty 1

## 2018-02-20 NOTE — ED Triage Notes (Signed)
Pt C/O right sided neck pain that started this AM. Pt also states he noticed he had "blood in his right eye."

## 2018-02-21 ENCOUNTER — Encounter (HOSPITAL_COMMUNITY): Payer: Medicare Other

## 2018-02-21 ENCOUNTER — Telehealth: Payer: Self-pay | Admitting: Cardiology

## 2018-02-21 LAB — PROTIME-INR
INR: 1.54
Prothrombin Time: 18.4 seconds — ABNORMAL HIGH (ref 11.4–15.2)

## 2018-02-21 MED ORDER — METHOCARBAMOL 500 MG PO TABS: 500.0000 mg | ORAL_TABLET | Freq: Three times a day (TID) | ORAL | 0 refills | Status: DC | PRN

## 2018-02-21 NOTE — Telephone Encounter (Signed)
Pt was seen in the ER at [facility] yesterday for BP problems, please give him a call concerning his meds.

## 2018-02-21 NOTE — Discharge Instructions (Signed)
Your INR is 1.54, so increase your coumadin as you have been instructed or call your INR clinic in the morning for further instructions. Use ice and heat on your neck for comfort. Take the muscle relaxer for your neck pain. The bleeding in the white of your eye will resolve over the next 2 weeks. Recheck if you have a change in your vision or pain.

## 2018-02-21 NOTE — ED Provider Notes (Signed)
[facility] EMERGENCY DEPARTMENT Provider Note   CSN: [id number] Arrival date & time: 02/20/18  1847  Time seen 23:42 PM    History   Chief Complaint Chief Complaint  Patient presents with  . Neck Pain    HPI Matthew Hall is a 72 y.o. male.  HPI   patient states he is on Coumadin for atrial fibrillation.  He states he checks his INR at home weekly.  He states when he checked it today he never got enough blood and he got a reading of 1.5 and 1.6.  He states his target is between 2-3.  He states he was driving his car and when he looked in the rearview mirror about 6 PM he noticed he had some blood on his right eye.  He denies any eye pain, loss of visual acuity, stinging sensation or burning sensation.  He denies any coughing and states he has had some sneezing but it was worse yesterday.  He states he is never had this happen before.  He also states when he woke up this morning he had a "crick" in his neck, and he points to the right side of his neck.  He states it hurts especially when he turns his head to the right.  He states it is better now than it was.  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  Pt states he is going to pulmonary rehab.   PCP [name], MD Cardiology Dr Domenic Polite  Past Medical History:  Diagnosis Date  . Anxiety   . Aortic atherosclerosis ([place])   . Atrial fibrillation ([place])   . Basal cell carcinoma   . Bladder cancer ([place])   . Bladder tumor   . Colon polyps   . COPD (chronic obstructive pulmonary disease) ([place])   . Cutaneous vasculitis   . Diverticulosis   . Essential hypertension   . GERD (gastroesophageal reflux disease)   . History of pneumonia   . Nephrolithiasis   . Osteoarthritis     Patient Active Problem List   Diagnosis Date Noted  . Leucocytosis  08/11/2017  . Acute respiratory failure with hypoxia ([place]) 08/11/2017  . Elevated sed rate 05/21/2017  . COPD without exacerbation ([place]) 03/01/2017  . 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 Atrial fibrillation, chronic ([place]) 07/09/2016  . BRONCHIECTASIS W/O ACUTE EXACERBATION 03/31/2008  . Essential hypertension 09/13/2007  . Allergic rhinitis 09/13/2007  . EMPHYSEMA 09/13/2007  . COPD exacerbation ([place]) 09/13/2007    Past Surgical History:  Procedure Laterality Date  . COLONOSCOPY    . Kidney stones removed  1996  . SKIN CANCER EXCISION Left    2003  . TRANSURETHRAL RESECTION OF BLADDER TUMOR  1996        Home Medications    Prior to Admission medications   Medication Sig Start Date End Date Taking? Authorizing Provider  albuterol (PROVENTIL HFA;VENTOLIN HFA) 108 (90 BASE) MCG/ACT inhaler Inhale 2 puffs into the lungs every 6 (six) hours as needed for wheezing or shortness of breath. 08/03/14   Rigoberto Noel, MD  ALPRAZolam Duanne Moron) 0.25 MG tablet Take 0.25 mg by mouth 2 (two) times daily as needed for anxiety or sleep.  07/01/14   [provider]  budesonide-formoterol (SYMBICORT) 160-4.5 MCG/ACT inhaler Inhale 2  puffs into the lungs 2 (two) times daily. 12/01/14   Rigoberto Noel, MD  cetirizine (ZYRTEC) 5 MG tablet Take 5 mg by mouth daily.    [provider]  D3-50 50000 units capsule Take 50,000 Units by mouth once a week. Usually Tuesday or Wednesday. 11/23/17   [provider]  erythromycin ophthalmic ointment Place 1 application into both eyes daily as needed (Infection).    [provider]  hydroxypropyl methylcellulose / hypromellose (ISOPTO TEARS / GONIOVISC) 2.5 % ophthalmic solution Place 1 drop into both eyes as needed for dry eyes.    [provider]  levalbuterol Penne Lash) 0.63 MG/3ML nebulizer solution Take 3 mLs (0.63 mg total) by nebulization every 6 (six) hours as needed for wheezing or shortness of breath. 02/08/18    Rigoberto Noel, MD  losartan-hydrochlorothiazide (HYZAAR) 50-12.5 MG tablet Take 0.5 tablets by mouth 2 (two) times daily.     [provider]  methocarbamol (ROBAXIN) 500 MG tablet Take 1 tablet (500 mg total) by mouth every 8 (eight) hours as needed for muscle spasms. 02/21/18   Rolland Porter, MD  metoprolol succinate (TOPROL-XL) 50 MG 24 hr tablet Take 1 tablet (50 mg total) by mouth 2 (two) times daily. 07/11/16   Thurnell Lose, MD  Multiple Vitamin (MULTIVITAMIN WITH MINERALS) TABS tablet Take 1 tablet by mouth daily.    [provider]  omeprazole (PRILOSEC) 40 MG capsule Take 40 mg by mouth daily.    [provider]  triamcinolone (NASACORT) 55 MCG/ACT AERO nasal inhaler Place 1 spray into the nose daily as needed (Congestion).     [provider]  warfarin (COUMADIN) 2.5 MG tablet Take 2.5 mg by mouth daily.    [provider]    Family History Family History  Problem Relation Age of Onset  . Emphysema Father   . Lung cancer Brother   . Liver cancer Brother   . Diabetes Mother   . CAD Mother   . Diabetes Sister   . Stroke Sister   . Colon cancer Neg Hx     Social History Social History   Tobacco Use  . Smoking status: Former Smoker    Packs/day: 1.00    Years: 30.00    Pack years: 30.00    Types: Cigarettes    Last attempt to quit: 07/07/2011    Years since quitting: 6.6  . Smokeless tobacco: Never Used  Substance Use Topics  . Alcohol use: No    Alcohol/week: 0.0 standard drinks  . Drug use: No     Allergies   Simvastatin; Albuterol; Augmentin [amoxicillin-pot clavulanate]; Ciprofloxacin; Contrast media [iodinated diagnostic agents]; Flagyl [metronidazole]; Latex; and Sulfa antibiotics   Review of Systems Review of Systems  All other systems reviewed and are negative.    Physical Exam Updated Vital Signs BP (!) 149/81 (BP Location: Left Arm)   Pulse 80   Temp 97.8 F (36.6 C) (Temporal)   Resp 16   Ht 5'  11" (1.803 m)   Wt 81.6 kg   SpO2 97%   BMI 25.10 kg/m   Physical Exam  Constitutional: He is oriented to person, place, and time. He appears well-developed and well-nourished.  Non-toxic appearance. He does not appear ill. No distress.  HENT:  Head: Normocephalic and atraumatic.  Right Ear: External ear normal.  Left Ear: External ear normal.  Nose: Nose normal. No mucosal edema or rhinorrhea.  Mouth/Throat: Oropharynx is clear and moist and mucous membranes are normal.  No dental abscesses or uvula swelling.  Eyes: Pupils are equal, round, and reactive to light. EOM are normal.    Patient has a sub-conjunctival hemorrhage of the medial aspect of his right sclera  Neck: Full passive range of motion without pain. Neck supple.  Patient has restricted range of motion to the right.  He is nontender in the midline cervical spine but is tender of the immediate right paraspinous muscles to palpation.  He has good range of motion to the left.  Cardiovascular: Normal rate, regular rhythm and normal heart sounds. Exam reveals no gallop and no friction rub.  No murmur heard. Pulmonary/Chest: Effort normal and breath sounds normal. No respiratory distress. He has no wheezes. He has no rhonchi. He has no rales. He exhibits no tenderness and no crepitus.  Abdominal: Soft. Normal appearance and bowel sounds are normal. He exhibits no distension. There is no tenderness. There is no rebound and no guarding.  Musculoskeletal: Normal range of motion. He exhibits no edema or tenderness.  Moves all extremities well.   Neurological: He is alert and oriented to person, place, and time. He has normal strength. No cranial nerve deficit.  Skin: Skin is warm, dry and intact. No rash noted. No erythema. No pallor.  Psychiatric: He has a normal mood and affect. His speech is normal and behavior is normal. His mood appears not anxious.  Nursing note and vitals reviewed.    Visual Acuity  Right Eye  Distance: 20/20 Left Eye Distance: 20/25 Bilateral Distance:    Right Eye Near:   Left Eye Near:    Bilateral Near:      ED Treatments / Results  Labs (all labs ordered are listed, but only abnormal results are displayed) Results for orders placed or performed during the hospital encounter of 02/20/18  Protime-INR  Result Value Ref Range   Prothrombin Time 18.4 (H) 11.4 - 15.2 seconds   INR 1.54    Laboratory interpretation all normal subtherapeutic INR    EKG None  Radiology Ct Cervical Spine Wo Contrast  Result Date: 02/20/2018 CLINICAL DATA:  Right-sided neck pain starting this morning. Blood in the right eye. EXAM: CT CERVICAL SPINE WITHOUT CONTRAST TECHNIQUE: Multidetector CT imaging of the cervical spine was performed without intravenous contrast. Multiplanar CT image reconstructions were also generated. COMPARISON:  None. FINDINGS: Alignment: Normal. Skull base and vertebrae: Skull base appears intact. No vertebral compression deformities. No focal bone lesion or bone destruction. Bone cortex appears intact. Soft tissues and spinal canal: No prevertebral soft tissue swelling. No abnormal paraspinal soft tissue mass or infiltration. Some fatty atrophy of the paraspinal muscles. Disc levels: Degenerative changes throughout the cervical spine with narrowed cervical interspaces and endplate hypertrophic changes throughout. Degenerative changes are most prominent at C4-5, C5-6, and C6-7 levels. Prominent disc osteophyte complex at C4-5 and C5-6 levels cause mild effacement of the thecal sac. Upper chest: Calcification and scarring in the lung apices likely representing old inflammatory process. Probable emphysematous changes. Other: None. IMPRESSION: Degenerative changes in the cervical spine. No acute displaced fractures identified. Fibrocalcific changes in the lung apices likely postinflammatory. Electronically Signed   By: Lucienne Capers M.D.   On: 02/20/2018 23:47     Procedures Procedures (including critical care time)  Medications Ordered in ED Medications  methocarbamol (ROBAXIN) tablet 500 mg (has no administration in time range)     Initial Impression / Assessment and Plan / ED Course  I have reviewed the triage vital signs and the nursing notes.  Pertinent labs & imaging results that were available during my care of the patient were reviewed by me and considered in my medical decision making (see chart for details).    Family was concerned about his blood pressure, it was repeated and was 149/81.  INR was done in the ED.  He was given oral Robaxin while waiting for his INR to result for his muscle spasm in his neck.   Final Clinical Impressions(s) / ED Diagnoses   Final diagnoses:  Subconjunctival hemorrhage of right eye  Torticollis, acute    ED Discharge Orders         Ordered    methocarbamol (ROBAXIN) 500 MG tablet  Every 8 hours PRN     02/21/18 0047          Plan discharge  Rolland Porter, MD, Barbette Or, MD 02/21/18 [phone]

## 2018-02-21 NOTE — Telephone Encounter (Signed)
Returned pt call. He stated that he was seen in ED on 8/14. His blood pressure was 160/100, 150/85, and was 149/81. He is worried that this is too high. He would like to know if his blood pressure medications can be adjusted. He has no troubles with his heart racing, dizziness or SOB at this time. He also stated that his INR that he checks at home was 1.5. I advised him to contact his PCP to let them know, as we do not follow his INR checks.

## 2018-02-22 NOTE — Telephone Encounter (Signed)
Called pt.. Advised him to keep a blood pressure log and take it to his pcp. He voiced understanding pf plan.

## 2018-02-22 NOTE — Telephone Encounter (Signed)
Noted.  Those blood pressures are higher than they should be.  He is currently on Toprol-XL and Hyzaar.  I would have him keep a log of blood pressure and then follow-up with his PCP to consider further adjustments.  He does have room for up titration of current regimen.

## 2018-02-26 ENCOUNTER — Encounter (HOSPITAL_COMMUNITY): Payer: Medicare Other

## 2018-02-28 ENCOUNTER — Encounter (HOSPITAL_COMMUNITY)
Admission: RE | Admit: 2018-02-28 | Discharge: 2018-02-28 | Disposition: A | Discharge status: Home / Self Care | Payer: Medicare Other | Source: Ambulatory Visit | Attending: Pulmonary Disease | Admitting: Pulmonary Disease

## 2018-02-28 DIAGNOSIS — J449 Chronic obstructive pulmonary disease, unspecified: Secondary | ICD-10-CM

## 2018-02-28 DIAGNOSIS — J439 Emphysema, unspecified: Secondary | ICD-10-CM | POA: Diagnosis not present

## 2018-02-28 NOTE — Progress Notes (Signed)
Daily Session Note  Patient Details  Name: Matthew Hall MRN: [id number] Date of Birth: [birth year] Referring Provider:     PULMONARY REHAB CHRONIC OBSTRUCTIVE PULMONARY DISEASE from 01/03/2018 in [place]  Referring Provider  Dr. Elsworth Soho  [facility])       Encounter Date: 02/28/2018  Check In: Session Check In - 02/28/18 1045      Check-In   Supervising physician immediately available to respond to emergencies  See telemetry face sheet for immediately available MD    Location  [facility]    Staff Present  Russella Dar, MS, EP, [facility], Exercise Physiologist;Supriya Beaston Wynetta Emery, RN, BSN    Medication changes reported      No    Fall or balance concerns reported     No    Warm-up and Cool-down  Performed as group-led instruction    Resistance Training Performed  Yes    VAD Patient?  No    PAD/SET Patient?  No      Pain Assessment   Currently in Pain?  No/denies    Pain Score  0-No pain    Multiple Pain Sites  No       Capillary Blood Glucose: No results found for this or any previous visit (from the past 24 hour(s)).    Social History   Tobacco Use  Smoking Status Former Smoker  . Packs/day: 1.00  . Years: 30.00  . Pack years: 30.00  . Types: Cigarettes  . Last attempt to quit: 07/07/2011  . Years since quitting: 6.6  Smokeless Tobacco Never Used    Goals Met:  Proper associated with RPD/PD & O2 Sat Independence with exercise equipment Improved SOB with ADL's Using PLB without cueing & demonstrates good technique Exercise tolerated well No report of cardiac concerns or symptoms Strength training completed today  Goals Unmet:  Not Applicable  Comments: Pt able to follow exercise prescription today without complaint.  Will continue to monitor for progression. Check out 1145.   Dr. Sinda Du is Medical Director for [facility] Pulmonary Rehab.

## 2018-03-05 ENCOUNTER — Encounter (HOSPITAL_COMMUNITY)
Admission: RE | Admit: 2018-03-05 | Discharge: 2018-03-05 | Disposition: A | Discharge status: Home / Self Care | Payer: Medicare Other | Source: Ambulatory Visit | Attending: Pulmonary Disease | Admitting: Pulmonary Disease

## 2018-03-05 DIAGNOSIS — J439 Emphysema, unspecified: Secondary | ICD-10-CM | POA: Diagnosis not present

## 2018-03-05 DIAGNOSIS — J449 Chronic obstructive pulmonary disease, unspecified: Secondary | ICD-10-CM

## 2018-03-05 NOTE — Progress Notes (Signed)
Daily Session Note  Patient Details  Name: Matthew Hall MRN: [id number] Date of Birth: 11-27-1945 Referring Provider:     PULMONARY REHAB CHRONIC OBSTRUCTIVE PULMONARY DISEASE from 01/03/2018 in [place]  Referring Provider  Dr. Elsworth Soho  [facility])       Encounter Date: 03/05/2018  Check In: Session Check In - 03/05/18 1045      Check-In   Supervising physician immediately available to respond to emergencies  See telemetry face sheet for immediately available MD    Location  [facility]    Staff Present  Russella Dar, MS, EP, [facility], Exercise Physiologist;Other    Medication changes reported      No    Fall or balance concerns reported     No    Tobacco Cessation  No Change    Warm-up and Cool-down  Performed as group-led instruction    Resistance Training Performed  Yes    VAD Patient?  No    PAD/SET Patient?  No      Pain Assessment   Currently in Pain?  No/denies    Multiple Pain Sites  No       Capillary Blood Glucose: No results found for this or any previous visit (from the past 24 hour(s)).    Social History   Tobacco Use  Smoking Status Former Smoker  . Packs/day: 1.00  . Years: 30.00  . Pack years: 30.00  . Types: Cigarettes  . Last attempt to quit: 07/07/2011  . Years since quitting: 6.6  Smokeless Tobacco Never Used    Goals Met:  Proper associated with RPD/PD & O2 Sat Independence with exercise equipment Improved SOB with ADL's Exercise tolerated well Personal goals reviewed No report of cardiac concerns or symptoms Strength training completed today  Goals Unmet:  Not Applicable  Comments: Pt able to follow exercise prescription today without complaint.  Will continue to monitor for progression. Check out: 1145   Dr. Sinda Du is Medical Director for [facility] Pulmonary Rehab.

## 2018-03-07 ENCOUNTER — Encounter (HOSPITAL_COMMUNITY)
Admission: RE | Admit: 2018-03-07 | Discharge: 2018-03-07 | Disposition: A | Discharge status: Home / Self Care | Payer: Medicare Other | Source: Ambulatory Visit | Attending: Pulmonary Disease | Admitting: Pulmonary Disease

## 2018-03-07 DIAGNOSIS — J439 Emphysema, unspecified: Secondary | ICD-10-CM | POA: Diagnosis not present

## 2018-03-07 DIAGNOSIS — J449 Chronic obstructive pulmonary disease, unspecified: Secondary | ICD-10-CM

## 2018-03-07 NOTE — Progress Notes (Signed)
Daily Session Note  Patient Details  Name: KYLAR LEONHARDT MRN: [id number] Date of Birth: [birth year] Referring Provider:     PULMONARY REHAB CHRONIC OBSTRUCTIVE PULMONARY DISEASE from 01/03/2018 in [place]  Referring Provider  Dr. Elsworth Soho  [facility])       Encounter Date: 03/07/2018  Check In: Session Check In - 03/07/18 1045      Check-In   Supervising physician immediately available to respond to emergencies  See telemetry face sheet for immediately available MD    Location  [facility]    Staff Present  Russella Dar, MS, EP, [facility], Exercise Physiologist;Keon Benscoter Wynetta Emery, RN, BSN    Medication changes reported      No    Fall or balance concerns reported     No    Warm-up and Cool-down  Performed as group-led instruction    Resistance Training Performed  Yes    VAD Patient?  No    PAD/SET Patient?  No      Pain Assessment   Currently in Pain?  No/denies    Pain Score  0-No pain    Multiple Pain Sites  No       Capillary Blood Glucose: No results found for this or any previous visit (from the past 24 hour(s)).    Social History   Tobacco Use  Smoking Status Former Smoker  . Packs/day: 1.00  . Years: 30.00  . Pack years: 30.00  . Types: Cigarettes  . Last attempt to quit: 07/07/2011  . Years since quitting: 6.6  Smokeless Tobacco Never Used    Goals Met:  Proper associated with RPD/PD & O2 Sat Independence with exercise equipment Improved SOB with ADL's Using PLB without cueing & demonstrates good technique Exercise tolerated well No report of cardiac concerns or symptoms Strength training completed today  Goals Unmet:  Not Applicable  Comments: Pt able to follow exercise prescription today without complaint.  Will continue to monitor for progression. Check out 1145.   Dr. Sinda Du is Medical Director for [facility].

## 2018-03-07 NOTE — Progress Notes (Signed)
Pulmonary Individual Treatment Plan  Patient Details  Name: Matthew Hall MRN: [id number] Date of Birth: Jan 25, 1946 Referring Provider:     PULMONARY REHAB CHRONIC OBSTRUCTIVE PULMONARY DISEASE from 01/03/2018 in [facility]  Referring Provider  Dr. Elsworth Soho  (Pended)       Initial Encounter Date:    PULMONARY REHAB CHRONIC OBSTRUCTIVE PULMONARY DISEASE from 01/03/2018 in [facility]  Date  12/24/17  (Pended)       Visit Diagnosis: Chronic obstructive pulmonary disease, unspecified COPD type ([place])  Patient's Home Medications on Admission:   Current Outpatient Medications:  .  albuterol (PROVENTIL HFA;VENTOLIN HFA) 108 (90 BASE) MCG/ACT inhaler, Inhale 2 puffs into the lungs every 6 (six) hours as needed for wheezing or shortness of breath., Disp: 1 Inhaler, Rfl: 6 .  ALPRAZolam (XANAX) 0.25 MG tablet, Take 0.25 mg by mouth 2 (two) times daily as needed for anxiety or sleep. , Disp: , Rfl: 3 .  budesonide-formoterol (SYMBICORT) 160-4.5 MCG/ACT inhaler, Inhale 2 puffs into the lungs 2 (two) times daily., Disp: 1 Inhaler, Rfl: 6 .  cetirizine (ZYRTEC) 5 MG tablet, Take 5 mg by mouth daily., Disp: , Rfl:  .  D3-50 50000 units capsule, Take 50,000 Units by mouth once a week. Usually Tuesday or Wednesday., Disp: , Rfl: 0 .  erythromycin ophthalmic ointment, Place 1 application into both eyes daily as needed (Infection)., Disp: , Rfl:  .  hydroxypropyl methylcellulose / hypromellose (ISOPTO TEARS / GONIOVISC) 2.5 % ophthalmic solution, Place 1 drop into both eyes as needed for dry eyes., Disp: , Rfl:  .  levalbuterol (XOPENEX) 0.63 MG/3ML nebulizer solution, Take 3 mLs (0.63 mg total) by nebulization every 6 (six) hours as needed for wheezing or shortness of breath., Disp: 75 mL, Rfl: 6 .  losartan-hydrochlorothiazide (HYZAAR) 50-12.5 MG tablet, Take 0.5 tablets by mouth 2 (two) times daily. , Disp: , Rfl:  .  methocarbamol (ROBAXIN) 500 MG tablet,  Take 1 tablet (500 mg total) by mouth every 8 (eight) hours as needed for muscle spasms., Disp: 20 tablet, Rfl: 0 .  metoprolol succinate (TOPROL-XL) 50 MG 24 hr tablet, Take 1 tablet (50 mg total) by mouth 2 (two) times daily., Disp: 60 tablet, Rfl: 0 .  Multiple Vitamin (MULTIVITAMIN WITH MINERALS) TABS tablet, Take 1 tablet by mouth daily., Disp: , Rfl:  .  omeprazole (PRILOSEC) 40 MG capsule, Take 40 mg by mouth daily., Disp: , Rfl:  .  triamcinolone (NASACORT) 55 MCG/ACT AERO nasal inhaler, Place 1 spray into the nose daily as needed (Congestion). , Disp: , Rfl:  .  warfarin (COUMADIN) 2.5 MG tablet, Take 2.5 mg by mouth daily., Disp: , Rfl:   Past Medical History: Past Medical History:  Diagnosis Date  . Anxiety   . Aortic atherosclerosis ([place])   . Atrial fibrillation ([place])   . Basal cell carcinoma   . Bladder cancer ([place])   . Bladder tumor   . Colon polyps   . COPD (chronic obstructive pulmonary disease) ([place])   . Cutaneous vasculitis   . Diverticulosis   . Essential hypertension   . GERD (gastroesophageal reflux disease)   . History of pneumonia   . Nephrolithiasis   . Osteoarthritis     Tobacco Use: Social History   Tobacco Use  Smoking Status Former Smoker  . Packs/day: 1.00  . Years: 30.00  . Pack years: 30.00  . Types: Cigarettes  . Last attempt to quit: 07/07/2011  . Years since quitting: 6.6  Smokeless Tobacco Never Used    Labs: Recent Review Flowsheet Data    Labs for ITP Cardiac and Pulmonary Rehab Latest Ref Rng & Units 07/24/2015 07/09/2016 08/09/2017   PHART 7.350 - 7.450 - - 7.459(H)   PCO2ART 32.0 - 48.0 mmHg - - 42.8   HCO3 20.0 - 28.0 mmol/L - - 29.5(H)   TCO2 0 - 100 mmol/L 26 27 -   O2SAT % - - 95.7      Capillary Blood Glucose: No results found for: GLUCAP   Pulmonary Assessment Scores: Pulmonary Assessment Scores    Row Name 12/24/17 1410         ADL UCSD   ADL Phase  Entry     SOB Score total  21     Rest  0     Walk  4      Stairs  3     Bath  0     Dress  0     Shop  2       CAT Score   CAT Score  13       mMRC Score   mMRC Score  2        Pulmonary Function Assessment: Pulmonary Function Assessment - 12/24/17 1400      Pulmonary Function Tests   FVC%  1.79 %    FEV1%  4.54 %    FEV1/FVC Ratio  53    RV%  73 %      Initial Spirometry Results   FVC%  1.83 %    FEV1%  40 %    FEV1/FVC Ratio  55      Post Bronchodilator Spirometry Results   FVC%  1.83 %    FEV1%  40 %      Breath   Bilateral Breath Sounds  Clear    Shortness of Breath  Yes       Exercise Target Goals: Exercise Program Goal: Individual exercise prescription set using results from initial 6 min walk test and THRR while considering  patient's activity barriers and safety.   Exercise Prescription Goal: Initial exercise prescription builds to 30-45 minutes a day of aerobic activity, 2-3 days per week.  Home exercise guidelines will be given to patient during program as part of exercise prescription that the participant will acknowledge.  Activity Barriers & Risk Stratification: Activity Barriers & Cardiac Risk Stratification - 12/24/17 1417      Activity Barriers & Cardiac Risk Stratification   Activity Barriers  Shortness of Breath    Cardiac Risk Stratification  Low       6 Minute Walk: 6 Minute Walk    Row Name 12/24/17 1415         6 Minute Walk   Phase  Initial     Distance  1250 feet     Distance % Change  0 %     Distance Feet Change  0 ft     Walk Time  6 minutes     # of Rest Breaks  0     MPH  2.36     METS  2.81     RPE  13     Perceived Dyspnea   11     VO2 Peak  10.17     Symptoms  Yes (comment)     Comments  Patient states that he felt a "funny" feeling in his head during walk test. Feeling went away after rest     Resting HR  101 bpm     Resting BP  98/62     Resting Oxygen Saturation   92 %     Exercise Oxygen Saturation  during 6 min walk  87 %     Max Ex. HR  112 bpm     Max Ex. BP   118/64     2 Minute Post BP  104/60        Oxygen Initial Assessment: Oxygen Initial Assessment - 12/24/17 1412      Home Oxygen   Home Oxygen Device  None    Sleep Oxygen Prescription  None    Home Exercise Oxygen Prescription  None    Home at Rest Exercise Oxygen Prescription  None    Compliance with Home Oxygen Use  --   N/A     Initial 6 min Walk   Oxygen Used  None      Program Oxygen Prescription   Program Oxygen Prescription  None       Oxygen Re-Evaluation: Oxygen Re-Evaluation    Row Name 02/14/18 1251 03/07/18 0752           Program Oxygen Prescription   Program Oxygen Prescription  None  None        Home Oxygen   Home Oxygen Device  None  None      Sleep Oxygen Prescription  None  -      Home Exercise Oxygen Prescription  None  None      Home at Rest Exercise Oxygen Prescription  None  None        Goals/Expected Outcomes   Short Term Goals  To learn and understand importance of monitoring SPO2 with pulse oximeter and demonstrate accurate use of the pulse oximeter.;To learn and understand importance of maintaining oxygen saturations>88%;To learn and demonstrate proper pursed lip breathing techniques or other breathing techniques.;To learn and exhibit compliance with exercise, home and travel O2 prescription  To learn and understand importance of monitoring SPO2 with pulse oximeter and demonstrate accurate use of the pulse oximeter.;To learn and understand importance of maintaining oxygen saturations>88%;To learn and demonstrate proper pursed lip breathing techniques or other breathing techniques.;To learn and exhibit compliance with exercise, home and travel O2 prescription      Long  Term Goals  Exhibits compliance with exercise, home and travel O2 prescription;Verbalizes importance of monitoring SPO2 with pulse oximeter and return demonstration;Maintenance of O2 saturations>88%;Exhibits proper breathing techniques, such as pursed lip breathing or other method  taught during program session;Compliance with respiratory medication  Exhibits compliance with exercise, home and travel O2 prescription;Verbalizes importance of monitoring SPO2 with pulse oximeter and return demonstration;Maintenance of O2 saturations>88%;Exhibits proper breathing techniques, such as pursed lip breathing or other method taught during program session;Compliance with respiratory medication      Comments  Patient verbalizes importance of monitoring O2 and maintaining >88% and is able to demonstrate proper usage of pulse oximeter. He also is able to demonstrate proper pursed lip breathing technique during exercise. Will conitnue to monitor.   Patient verbalizes importance of monitoring O2 and maintaining >88% and is able to demonstrate proper usage of pulse oximeter. He also is able to demonstrate proper pursed lip breathing technique during exercise. Will conitnue to monitor.       Goals/Expected Outcomes  Patient will continue to meet his short and long term goals.   Patient will continue to meet his short and long term goals.          Oxygen Discharge (Final Oxygen  Re-Evaluation): Oxygen Re-Evaluation - 03/07/18 0752      Program Oxygen Prescription   Program Oxygen Prescription  None      Home Oxygen   Home Oxygen Device  None    Home Exercise Oxygen Prescription  None    Home at Rest Exercise Oxygen Prescription  None      Goals/Expected Outcomes   Short Term Goals  To learn and understand importance of monitoring SPO2 with pulse oximeter and demonstrate accurate use of the pulse oximeter.;To learn and understand importance of maintaining oxygen saturations>88%;To learn and demonstrate proper pursed lip breathing techniques or other breathing techniques.;To learn and exhibit compliance with exercise, home and travel O2 prescription    Long  Term Goals  Exhibits compliance with exercise, home and travel O2 prescription;Verbalizes importance of monitoring SPO2 with pulse  oximeter and return demonstration;Maintenance of O2 saturations>88%;Exhibits proper breathing techniques, such as pursed lip breathing or other method taught during program session;Compliance with respiratory medication    Comments  Patient verbalizes importance of monitoring O2 and maintaining >88% and is able to demonstrate proper usage of pulse oximeter. He also is able to demonstrate proper pursed lip breathing technique during exercise. Will conitnue to monitor.     Goals/Expected Outcomes  Patient will continue to meet his short and long term goals.        Initial Exercise Prescription: Initial Exercise Prescription - 01/08/18 0700      Date of Initial Exercise RX and Referring Provider   Date  12/24/17  (Pended)     Referring Provider  Dr. Elsworth Soho  (Pended)       Treadmill   MPH  1.3    Grade  0    Minutes  15    METs  1.9      NuStep   Level  1    Minutes  20      Prescription Details   Frequency (times per week)  2  (Pended)       Intensity   THRR 40-80% of Max Heartrate  [phone]  (Pended)     Ratings of Perceived Exertion  11-13  (Pended)     Perceived Dyspnea  0-4  (Pended)       Progression   Progression  Continue progressive overload as per policy without signs/symptoms or physical distress.  (Pended)       Resistance Training   Training Prescription  Yes    Weight  1    Reps  10-15       Perform Capillary Blood Glucose checks as needed.  Exercise Prescription Changes:  Exercise Prescription Changes    Row Name 01/08/18 0700 01/27/18 1200 02/13/18 1800 03/04/18 0700       Response to Exercise   Blood Pressure (Admit)  88/50  110/76  120/64  118/60    Blood Pressure (Exercise)  98/50  138/68  136/80  140/70    Blood Pressure (Exit)  90/56  118/62  140/62  108/60    Heart Rate (Admit)  87 bpm  82 bpm  74 bpm  83 bpm    Heart Rate (Exercise)  89 bpm  82 bpm  93 bpm  89 bpm    Heart Rate (Exit)  84 bpm  87 bpm  76 bpm  74 bpm    Oxygen Saturation  (Admit)  92 %  94 %  99 %  94 %    Oxygen Saturation (Exercise)  92 %  82 %  96 %  93 %    Oxygen Saturation (Exit)  94 %  92 %  95 %  94 %    Rating of Perceived Exertion (Exercise)  9  11  12  12     Perceived Dyspnea (Exercise)  9  11  12  12     Duration  Progress to 30 minutes of  aerobic without signs/symptoms of physical distress  Progress to 30 minutes of  aerobic without signs/symptoms of physical distress  Progress to 30 minutes of  aerobic without signs/symptoms of physical distress  Progress to 30 minutes of  aerobic without signs/symptoms of physical distress    Intensity  THRR New [phone]  THRR New 108-121-134  THRR New 103-118-132  THRR New 109-121-134      Progression   Progression  Continue to progress workloads to maintain intensity without signs/symptoms of physical distress.  Continue to progress workloads to maintain intensity without signs/symptoms of physical distress.  Continue to progress workloads to maintain intensity without signs/symptoms of physical distress.  Continue to progress workloads to maintain intensity without signs/symptoms of physical distress.    Average METs  -  -  2.13  1.96      Resistance Training   Training Prescription  -  Yes  Yes  Yes    Weight  -  1  2  2     Reps  -  10-15  10-15  10-15    Time  -  5 Minutes  5 Minutes  5 Minutes      Treadmill   MPH  -  1.3  1.5  1.6    Grade  -  0  0  0    Minutes  -  15  15  17     METs  -  1.99  2.14  2.22      NuStep   Level  -  0  2  2    SPM  79  80  87  78    Minutes  -  20  20  22     METs  1.9  1.6  2.12  1.7      Home Exercise Plan   Plans to continue exercise at  -  Home (comment)  Home (comment)  Home (comment)    Frequency  -  Add 3 additional days to program exercise sessions.  Add 3 additional days to program exercise sessions.  Add 3 additional days to program exercise sessions.    Initial Home Exercises Provided  -  01/01/18  01/01/18  01/01/18       Exercise  Comments:  Exercise Comments    Row Name 01/08/18 0755 02/13/18 1844 03/04/18 0752       Exercise Comments  Patient has just started PR. Patient will be progressed in time.   Patient feels much better, getting stronger and breathing better. Enjoys attending the program.   Patient feels much better, getting stronger and breathing better. Enjoys attending the program.         Exercise Goals and Review:  Exercise Goals    Row Name 12/24/17 1418             Exercise Goals   Increase Physical Activity  Yes       Intervention  Provide advice, education, support and counseling about physical activity/exercise needs.;Develop an individualized exercise prescription for aerobic and resistive training based on initial evaluation findings, risk stratification, comorbidities and participant's personal goals.       Expected  Outcomes  Short Term: Attend rehab on a regular basis to increase amount of physical activity.       Increase Strength and Stamina  Yes       Intervention  Provide advice, education, support and counseling about physical activity/exercise needs.;Develop an individualized exercise prescription for aerobic and resistive training based on initial evaluation findings, risk stratification, comorbidities and participant's personal goals.       Expected Outcomes  Short Term: Increase workloads from initial exercise prescription for resistance, speed, and METs.       Able to understand and use rate of perceived exertion (RPE) scale  Yes       Intervention  Provide education and explanation on how to use RPE scale       Expected Outcomes  Long Term:  Able to use RPE to guide intensity level when exercising independently;Short Term: Able to use RPE daily in rehab to express subjective intensity level       Able to understand and use Dyspnea scale  Yes       Intervention  Provide education and explanation on how to use Dyspnea scale       Expected Outcomes  Short Term: Able to use Dyspnea  scale daily in rehab to express subjective sense of shortness of breath during exertion;Long Term: Able to use Dyspnea scale to guide intensity level when exercising independently       Knowledge and understanding of Target Heart Rate Range (THRR)  Yes       Intervention  Provide education and explanation of THRR including how the numbers were predicted and where they are located for reference       Expected Outcomes  Short Term: Able to state/look up THRR;Long Term: Able to use THRR to govern intensity when exercising independently;Short Term: Able to use daily as guideline for intensity in rehab       Able to check pulse independently  Yes       Intervention  Provide education and demonstration on how to check pulse in carotid and radial arteries.;Review the importance of being able to check your own pulse for safety during independent exercise       Expected Outcomes  Short Term: Able to explain why pulse checking is important during independent exercise;Long Term: Able to check pulse independently and accurately       Understanding of Exercise Prescription  Yes       Intervention  Provide education, explanation, and written materials on patient's individual exercise prescription       Expected Outcomes  Short Term: Able to explain program exercise prescription;Long Term: Able to explain home exercise prescription to exercise independently          Exercise Goals Re-Evaluation : Exercise Goals Re-Evaluation    Row Name 01/14/18 1437 02/13/18 1840 03/04/18 0747         Exercise Goal Re-Evaluation   Exercise Goals Review  Increase Physical Activity;Increase Strength and Stamina;Able to understand and use rate of perceived exertion (RPE) scale;Improve claudication pain tolerance and improve walking ability;Knowledge and understanding of Target Heart Rate Range (THRR);Able to understand and use Dyspnea scale;Understanding of Exercise Prescription  Increase Strength and Stamina;Able to understand  and use Dyspnea scale;Knowledge and understanding of Target Heart Rate Range (THRR);Able to understand and use rate of perceived exertion (RPE) scale;Understanding of Exercise Prescription  Increase Strength and Stamina;Able to understand and use rate of perceived exertion (RPE) scale;Able to understand and use Dyspnea scale;Understanding of Exercise Prescription  Comments  Patient has only been to PR for 3 sessions so far. Patient will be progressed in time.   Patient has been able to tolerate increases with wieghts and equipment workloads. He feels he is getting stronger. He also feels he is breathing better. 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 this has not affected his ability to exercise.   Patient has been able to tolerate increases with weight and equipment workloads. he is getting stronger. We have a hard to getting his SaO2 when on TM. Overall he is doing well.      Expected Outcomes  Patient wishes to increase strength and to be able to breathe better   To cintinue to build strength and his breathing to continue to improve.   To cintinue to build strength and his breathing to continue to improve.         Discharge Exercise Prescription (Final Exercise Prescription Changes): Exercise Prescription Changes - 03/04/18 0700      Response to Exercise   Blood Pressure (Admit)  118/60    Blood Pressure (Exercise)  140/70    Blood Pressure (Exit)  108/60    Heart Rate (Admit)  83 bpm    Heart Rate (Exercise)  89 bpm    Heart Rate (Exit)  74 bpm    Oxygen Saturation (Admit)  94 %    Oxygen Saturation (Exercise)  93 %    Oxygen Saturation (Exit)  94 %    Rating of Perceived Exertion (Exercise)  12    Perceived Dyspnea (Exercise)  12    Duration  Progress to 30 minutes of  aerobic without signs/symptoms of physical distress    Intensity  THRR New   [phone]     Progression   Progression  Continue to progress workloads to maintain intensity without  signs/symptoms of physical distress.    Average METs  1.96      Resistance Training   Training Prescription  Yes    Weight  2    Reps  10-15    Time  5 Minutes      Treadmill   MPH  1.6    Grade  0    Minutes  17    METs  2.22      NuStep   Level  2    SPM  78    Minutes  22    METs  1.7      Home Exercise Plan   Plans to continue exercise at  Home (comment)    Frequency  Add 3 additional days to program exercise sessions.    Initial Home Exercises Provided  01/01/18       Nutrition:  Target Goals: Understanding of nutrition guidelines, daily intake of sodium 1500mg , cholesterol 200mg , calories 30% from fat and 7% or less from saturated fats, daily to have 5 or more servings of fruits and vegetables.  Biometrics: Pre Biometrics - 12/24/17 1419      Pre Biometrics   Height  5\' 11"  (1.803 m)    Weight  80.4 kg    Waist Circumference  40 inches    Hip Circumference  37 inches    Waist to Hip Ratio  1.08 %    BMI (Calculated)  24.73    Triceps Skinfold  16 mm    % Body Fat  26.7 %    Grip Strength  79.33 kg    Flexibility  0 in    Single  Leg Stand  3 seconds        Nutrition Therapy Plan and Nutrition Goals: Nutrition Therapy & Goals - 03/07/18 0747      Nutrition Therapy   RD appointment deferred  Yes      Personal Nutrition Goals   Additional Goals?  No       Nutrition Assessments: Nutrition Assessments - 12/24/17 1443      MEDFICTS Scores   Pre Score  48       Nutrition Goals Re-Evaluation:   Nutrition Goals Discharge (Final Nutrition Goals Re-Evaluation):   Psychosocial: Target Goals: Acknowledge presence or absence of significant depression and/or stress, maximize coping skills, provide positive support system. Participant is able to verbalize types and ability to use techniques and skills needed for reducing stress and depression.  Initial Review & Psychosocial Screening: Initial Psych Review & Screening - 12/24/17 1445      Initial  Review   Current issues with  None Identified      Family Dynamics   Good Support System?  Yes      Barriers   Psychosocial barriers to participate in program  There are no identifiable barriers or psychosocial needs.      Screening Interventions   Interventions  Encouraged to exercise    Expected Outcomes  Short Term goal: Identification and review with participant of any Quality of Life or Depression concerns found by scoring the questionnaire.;Long Term goal: The participant improves quality of Life and PHQ9 Scores as seen by post scores and/or verbalization of changes       Quality of Life Scores: Quality of Life - 12/24/17 1419      Quality of Life Scores   Health/Function Pre  16.16 %    Socioeconomic Pre  19.67 %    Psych/Spiritual Pre  24.64 %    Family Pre  14.5 %    GLOBAL Pre  18.28 %      Scores of 19 and below usually indicate a poorer quality of life in these areas.  A difference of  2-3 points is a clinically meaningful difference.  A difference of 2-3 points in the total score of the Quality of Life Index has been associated with significant improvement in overall quality of life, self-image, physical symptoms, and general health in studies assessing change in quality of life.   PHQ-9: Recent Review Flowsheet Data    Depression screen [facility] 2/9 12/24/2017 05/21/2017 03/06/2017 02/20/2017 08/06/2014   Decreased Interest 0 0 0 0 0   Down, Depressed, Hopeless 0 0 0 0 0   PHQ - 2 Score 0 0 0 0 0   Altered sleeping 1 - - 1  -   Tired, decreased energy 2 - - 1  -   Change in appetite 2 - - 1  -   Feeling bad or failure about yourself  0 - - 0 -   Trouble concentrating 0 - - 0 -   Moving slowly or fidgety/restless 0 - - 0 -   Suicidal thoughts 0 - - 0 -   PHQ-9 Score 5 - - 3 -   Difficult doing work/chores Somewhat difficult - - Somewhat difficult -     Interpretation of Total Score  Total Score Depression Severity:  1-4 = Minimal depression, 5-9 = Mild depression,  10-14 = Moderate depression, 15-19 = Moderately severe depression, 20-27 = Severe depression   Psychosocial Evaluation and Intervention: Psychosocial Evaluation - 12/24/17 1445      Psychosocial  Evaluation & Interventions   Interventions  Encouraged to exercise with the program and follow exercise prescription    Continue Psychosocial Services   No Follow up required       Psychosocial Re-Evaluation: Psychosocial Re-Evaluation    [place] Name 02/14/18 1256 03/07/18 0752           Psychosocial Re-Evaluation   Current issues with  None Identified  None Identified      Comments  Patient's initial QOL score was 18.28 and his PHQ-9 score was 5. He says he is not depressed. No psychosocial issues identified.   Patient's initial QOL score was 18.28 and his PHQ-9 score was 5. He says he is not depressed. No psychosocial issues identified.       Expected Outcomes  Patient will not have any psychosocial issues identified at discharge and his QOL and PHQ-9 scores will improve at discharge.   Patient will not have any psychosocial issues identified at discharge and his QOL and PHQ-9 scores will improve at discharge.       Interventions  Stress management education;Relaxation education;Encouraged to attend Pulmonary Rehabilitation for the exercise  Stress management education;Relaxation education;Encouraged to attend Pulmonary Rehabilitation for the exercise      Continue Psychosocial Services   No Follow up required  No Follow up required         Psychosocial Discharge (Final Psychosocial Re-Evaluation): Psychosocial Re-Evaluation - 03/07/18 2993      Psychosocial Re-Evaluation   Current issues with  None Identified    Comments  Patient's initial QOL score was 18.28 and his PHQ-9 score was 5. He says he is not depressed. No psychosocial issues identified.     Expected Outcomes  Patient will not have any psychosocial issues identified at discharge and his QOL and PHQ-9 scores will improve at  discharge.     Interventions  Stress management education;Relaxation education;Encouraged to attend Pulmonary Rehabilitation for the exercise    Continue Psychosocial Services   No Follow up required        Education: Education Goals: Education classes will be provided on a weekly basis, covering required topics. Participant will state understanding/return demonstration of topics presented.  Learning Barriers/Preferences: Learning Barriers/Preferences - 12/24/17 1341      Learning Barriers/Preferences   Learning Barriers  None    Learning Preferences  Verbal Instruction;Skilled Demonstration;Group Instruction       Education Topics: How Lungs Work and Diseases: - Discuss the anatomy of the lungs and diseases that can affect the lungs, such as COPD.   PULMONARY REHAB CHRONIC OBSTRUCTIVE PULMONARY DISEASE from 02/28/2018 in [place]  Date  02/14/18  Educator  DC  Instruction Review Code  2- Demonstrated Understanding      Exercise: -Discuss the importance of exercise, FITT principles of exercise, normal and abnormal responses to exercise, and how to exercise safely.   Environmental Irritants: -Discuss types of environmental irritants and how to limit exposure to environmental irritants.   Meds/Inhalers and oxygen: - Discuss respiratory medications, definition of an inhaler and oxygen, and the proper way to use an inhaler and oxygen.   PULMONARY REHAB CHRONIC OBSTRUCTIVE PULMONARY DISEASE from 02/28/2018 in [place]  Date  02/28/18  Educator  D. Wynetta Emery      Energy Saving Techniques: - Discuss methods to conserve energy and decrease shortness of breath when performing activities of daily living.    Bronchial Hygiene / Breathing Techniques: - Discuss breathing mechanics, pursed-lip breathing technique,  proper posture, effective  ways to clear airways, and other functional breathing techniques   Cleaning Equipment: -  Provides group verbal and written instruction about the health risks of elevated stress, cause of high stress, and healthy ways to reduce stress.   Nutrition I: Fats: - Discuss the types of cholesterol, what cholesterol does to the body, and how cholesterol levels can be controlled.   Nutrition II: Labels: -Discuss the different components of food labels and how to read food labels.   PULMONARY REHAB CHRONIC OBSTRUCTIVE PULMONARY DISEASE from 02/28/2018 in [place]  Date  01/03/18  Educator  D. Coad  Instruction Review Code  2- Demonstrated Understanding      Respiratory Infections: - Discuss the signs and symptoms of respiratory infections, ways to prevent respiratory infections, and the importance of seeking medical treatment when having a respiratory infection.   Stress I: Signs and Symptoms: - Discuss the causes of stress, how stress may lead to anxiety and depression, and ways to limit stress.   Stress II: Relaxation: -Discuss relaxation techniques to limit stress.   PULMONARY REHAB CHRONIC OBSTRUCTIVE PULMONARY DISEASE from 02/28/2018 in [place]  Date  01/24/18  Educator  D. Coad  Instruction Review Code  2- Demonstrated Understanding      Oxygen for Home/Travel: - Discuss how to prepare for travel when on oxygen and proper ways to transport and store oxygen to ensure safety.   PULMONARY REHAB CHRONIC OBSTRUCTIVE PULMONARY DISEASE from 02/28/2018 in [place]  Date  01/31/18  Educator  D. Coad  Instruction Review Code  2- Demonstrated Understanding      Knowledge Questionnaire Score: Knowledge Questionnaire Score - 12/24/17 1342      Knowledge Questionnaire Score   Pre Score  16/18       Core Components/Risk Factors/Patient Goals at Admission: Personal Goals and Risk Factors at Admission - 12/24/17 1444      Core Components/Risk Factors/Patient Goals on Admission    Weight Management   Weight Maintenance    Personal Goal Other  Yes    Personal Goal  Build strength, breathe better    Intervention  Attend PR 2 x week and supplement at home exercise 3 x week.     Expected Outcomes  Achieve personal goals.        Core Components/Risk Factors/Patient Goals Review:  Goals and Risk Factor Review    Row Name 02/14/18 1253 03/07/18 0747           Core Components/Risk Factors/Patient Goals Review   Personal Goals Review  Weight Management/Obesity;Develop more efficient breathing techniques such as purse lipped breathing and diaphragmatic breathing and practicing self-pacing with activity.;Improve shortness of breath with ADL's Build strength; breathe better; improve lung capacity.  Weight Management/Obesity;Develop more efficient breathing techniques such as purse lipped breathing and diaphragmatic breathing and practicing self-pacing with activity.;Improve shortness of breath with ADL's Build strength; breathe better; improve lung capacity.       Review  Patient has completed 12 sessions gaining 3 lbs since he started the program. He is doing well in the program with progression. He says he does feel a little stronger and is breathing a little better. He hopes to improve more as he continues the program but is pleased with his progress so far. Will continue to monitor for progress.   Patient has completed 15 sessions losing 1 lb since last 30 day review. He continues to do well in the program with progression. He says she program is  helping him have less SOB. He continues to feel stronger and better overall. He does demonstrate proper pursed lip breathing in the sessions if needed. Will continue to monitor for progress.       Expected Outcomes  Patient will continue to attend sessions and complete the program meeting his personal goals.   Patient will continue to attend sessions and complete the program meeting his personal goals.          Core Components/Risk Factors/Patient Goals at  Discharge (Final Review):  Goals and Risk Factor Review - 03/07/18 0747      Core Components/Risk Factors/Patient Goals Review   Personal Goals Review  Weight Management/Obesity;Develop more efficient breathing techniques such as purse lipped breathing and diaphragmatic breathing and practicing self-pacing with activity.;Improve shortness of breath with ADL's   Build strength; breathe better; improve lung capacity.    Review  Patient has completed 15 sessions losing 1 lb since last 30 day review. He continues to do well in the program with progression. He says she program is helping him have less SOB. He continues to feel stronger and better overall. He does demonstrate proper pursed lip breathing in the sessions if needed. Will continue to monitor for progress.     Expected Outcomes  Patient will continue to attend sessions and complete the program meeting his personal goals.        ITP Comments: ITP Comments    Row Name 12/24/17 1355 01/16/18 1405         ITP Comments  Mr. Elahi has been in the program before.   Patient new to program. He has completed 4 sessions. Will continue to monitor for progress.          Comments: ITP REVIEW Pt is making expected progress toward pulmonary rehab goals after completing 15 sessions. Recommend continued exercise, life style modification, education, and utilization of breathing techniques to increase stamina and strength and decrease shortness of breath with exertion.

## 2018-03-12 ENCOUNTER — Encounter (HOSPITAL_COMMUNITY)
Admission: RE | Admit: 2018-03-12 | Discharge: 2018-03-12 | Disposition: A | Discharge status: Home / Self Care | Payer: Medicare Other | Source: Ambulatory Visit | Attending: Pulmonary Disease | Admitting: Pulmonary Disease

## 2018-03-12 DIAGNOSIS — J439 Emphysema, unspecified: Secondary | ICD-10-CM | POA: Diagnosis not present

## 2018-03-12 DIAGNOSIS — Z79899 Other long term (current) drug therapy: Secondary | ICD-10-CM | POA: Insufficient documentation

## 2018-03-12 DIAGNOSIS — F419 Anxiety disorder, unspecified: Secondary | ICD-10-CM | POA: Diagnosis not present

## 2018-03-12 DIAGNOSIS — Z87442 Personal history of urinary calculi: Secondary | ICD-10-CM | POA: Diagnosis not present

## 2018-03-12 DIAGNOSIS — Z7951 Long term (current) use of inhaled steroids: Secondary | ICD-10-CM | POA: Diagnosis not present

## 2018-03-12 DIAGNOSIS — Z85828 Personal history of other malignant neoplasm of skin: Secondary | ICD-10-CM | POA: Insufficient documentation

## 2018-03-12 DIAGNOSIS — C679 Malignant neoplasm of bladder, unspecified: Secondary | ICD-10-CM | POA: Diagnosis not present

## 2018-03-12 DIAGNOSIS — J449 Chronic obstructive pulmonary disease, unspecified: Secondary | ICD-10-CM | POA: Diagnosis present

## 2018-03-12 DIAGNOSIS — Z87891 Personal history of nicotine dependence: Secondary | ICD-10-CM | POA: Diagnosis not present

## 2018-03-12 DIAGNOSIS — I4891 Unspecified atrial fibrillation: Secondary | ICD-10-CM | POA: Diagnosis not present

## 2018-03-12 DIAGNOSIS — I10 Essential (primary) hypertension: Secondary | ICD-10-CM | POA: Diagnosis not present

## 2018-03-12 DIAGNOSIS — Z86010 Personal history of colonic polyps: Secondary | ICD-10-CM | POA: Insufficient documentation

## 2018-03-12 DIAGNOSIS — K219 Gastro-esophageal reflux disease without esophagitis: Secondary | ICD-10-CM | POA: Insufficient documentation

## 2018-03-12 DIAGNOSIS — Z7901 Long term (current) use of anticoagulants: Secondary | ICD-10-CM | POA: Diagnosis not present

## 2018-03-12 NOTE — Progress Notes (Signed)
Daily Session Note  Patient Details  Name: ALEXANDRU MOORER MRN: [id number] Date of Birth: [birth year] Referring Provider:     PULMONARY REHAB CHRONIC OBSTRUCTIVE PULMONARY DISEASE from 01/03/2018 in [place]  Referring Provider  Dr. Elsworth Soho  [facility])       Encounter Date: 03/12/2018  Check In: Session Check In - 03/12/18 1544      Check-In   Supervising physician immediately available to respond to emergencies  See telemetry face sheet for immediately available MD    Location  [facility]    Staff Present  Russella Dar, MS, EP, [facility], Exercise Physiologist;Other    Medication changes reported      No    Fall or balance concerns reported     No    Tobacco Cessation  No Change    Warm-up and Cool-down  Performed as group-led instruction    Resistance Training Performed  Yes    VAD Patient?  No    PAD/SET Patient?  No      Pain Assessment   Currently in Pain?  No/denies    Pain Score  0-No pain    Multiple Pain Sites  No       Capillary Blood Glucose: No results found for this or any previous visit (from the past 24 hour(s)).    Social History   Tobacco Use  Smoking Status Former Smoker  . Packs/day: 1.00  . Years: 30.00  . Pack years: 30.00  . Types: Cigarettes  . Last attempt to quit: 07/07/2011  . Years since quitting: 6.6  Smokeless Tobacco Never Used    Goals Met:  Proper associated with RPD/PD & O2 Sat Improved SOB with ADL's Using PLB without cueing & demonstrates good technique Exercise tolerated well No report of cardiac concerns or symptoms Strength training completed today  Goals Unmet:  Not Applicable  Comments: Pt able to follow exercise prescription today without complaint.  Will continue to monitor for progression. Checked out at 1145.    Dr. Sinda Du is Medical Director for [facility] Pulmonary Rehab.

## 2018-03-14 ENCOUNTER — Encounter (HOSPITAL_COMMUNITY)
Admission: RE | Admit: 2018-03-14 | Discharge: 2018-03-14 | Disposition: A | Discharge status: Home / Self Care | Payer: Medicare Other | Source: Ambulatory Visit | Attending: Pulmonary Disease | Admitting: Pulmonary Disease

## 2018-03-14 DIAGNOSIS — J439 Emphysema, unspecified: Secondary | ICD-10-CM | POA: Diagnosis not present

## 2018-03-14 DIAGNOSIS — J449 Chronic obstructive pulmonary disease, unspecified: Secondary | ICD-10-CM

## 2018-03-14 NOTE — Progress Notes (Signed)
Daily Session Note  Patient Details  Name: Matthew Hall MRN: [id number] Date of Birth: Apr 05, 1946 Referring Provider:     PULMONARY REHAB CHRONIC OBSTRUCTIVE PULMONARY DISEASE from 01/03/2018 in [place]  Referring Provider  Dr. Elsworth Soho  [facility])       Encounter Date: 03/14/2018  Check In: Session Check In - 03/14/18 1124      Check-In   Supervising physician immediately available to respond to emergencies  See telemetry face sheet for immediately available MD    Location  [facility]    Staff Present  Russella Dar, MS, EP, [facility], Exercise Physiologist;Debra Wynetta Emery, RN, BSN    Medication changes reported      No    Fall or balance concerns reported     No    Tobacco Cessation  No Change    Warm-up and Cool-down  Performed as group-led instruction    Resistance Training Performed  Yes    VAD Patient?  No    PAD/SET Patient?  No      Pain Assessment   Currently in Pain?  No/denies    Multiple Pain Sites  No       Capillary Blood Glucose: No results found for this or any previous visit (from the past 24 hour(s)).    Social History   Tobacco Use  Smoking Status Former Smoker  . Packs/day: 1.00  . Years: 30.00  . Pack years: 30.00  . Types: Cigarettes  . Last attempt to quit: 07/07/2011  . Years since quitting: 6.6  Smokeless Tobacco Never Used    Goals Met:  Proper associated with RPD/PD & O2 Sat Independence with exercise equipment Using PLB without cueing & demonstrates good technique Exercise tolerated well No report of cardiac concerns or symptoms Strength training completed today  Goals Unmet:  Not Applicable  Comments: Pt able to follow exercise prescription today without complaint.  Will continue to monitor for progression. Checked out at 11:45pm   Dr. Sinda Du is Medical Director for [facility] Pulmonary Rehab.

## 2018-03-19 ENCOUNTER — Encounter (HOSPITAL_COMMUNITY): Payer: Medicare Other

## 2018-03-21 ENCOUNTER — Encounter (HOSPITAL_COMMUNITY)
Admission: RE | Admit: 2018-03-21 | Discharge: 2018-03-21 | Disposition: A | Discharge status: Home / Self Care | Payer: Medicare Other | Source: Ambulatory Visit | Attending: Pulmonary Disease | Admitting: Pulmonary Disease

## 2018-03-21 DIAGNOSIS — J439 Emphysema, unspecified: Secondary | ICD-10-CM | POA: Diagnosis not present

## 2018-03-21 DIAGNOSIS — J449 Chronic obstructive pulmonary disease, unspecified: Secondary | ICD-10-CM

## 2018-03-21 NOTE — Progress Notes (Signed)
Daily Session Note  Patient Details  Name: Matthew Hall MRN: [id number] Date of Birth: 24-Dec-1945 Referring Provider:     PULMONARY REHAB CHRONIC OBSTRUCTIVE PULMONARY DISEASE from 01/03/2018 in [place]  Referring Provider  Dr. Elsworth Soho  [facility])       Encounter Date: 03/21/2018  Check In: Session Check In - 03/21/18 1045      Check-In   Supervising physician immediately available to respond to emergencies  See telemetry face sheet for immediately available MD    Location  [facility]    Staff Present  Russella Dar, MS, EP, [facility], Exercise Physiologist;Zacariah Belue Wynetta Emery, RN, Cory Munch, Exercise Physiologist    Medication changes reported      No    Fall or balance concerns reported     No    Warm-up and Cool-down  Performed as group-led instruction    Resistance Training Performed  Yes    VAD Patient?  No    PAD/SET Patient?  No      Pain Assessment   Currently in Pain?  No/denies    Pain Score  0-No pain    Multiple Pain Sites  No       Capillary Blood Glucose: No results found for this or any previous visit (from the past 24 hour(s)).    Social History   Tobacco Use  Smoking Status Former Smoker  . Packs/day: 1.00  . Years: 30.00  . Pack years: 30.00  . Types: Cigarettes  . Last attempt to quit: 07/07/2011  . Years since quitting: 6.7  Smokeless Tobacco Never Used    Goals Met:  Proper associated with RPD/PD & O2 Sat Independence with exercise equipment Improved SOB with ADL's Using PLB without cueing & demonstrates good technique Exercise tolerated well No report of cardiac concerns or symptoms Strength training completed today  Goals Unmet:  Not Applicable  Comments: Pt able to follow exercise prescription today without complaint.  Will continue to monitor for progression. Check out 1145.   Dr. Sinda Du is Medical Director for [facility] Pulmonary Rehab.

## 2018-03-26 ENCOUNTER — Encounter (HOSPITAL_COMMUNITY): Payer: Medicare Other

## 2018-03-28 ENCOUNTER — Encounter (HOSPITAL_COMMUNITY)
Admission: RE | Admit: 2018-03-28 | Discharge: 2018-03-28 | Disposition: A | Discharge status: Home / Self Care | Payer: Medicare Other | Source: Ambulatory Visit | Attending: Pulmonary Disease | Admitting: Pulmonary Disease

## 2018-03-28 DIAGNOSIS — J439 Emphysema, unspecified: Secondary | ICD-10-CM | POA: Diagnosis not present

## 2018-03-28 DIAGNOSIS — J449 Chronic obstructive pulmonary disease, unspecified: Secondary | ICD-10-CM

## 2018-03-28 NOTE — Progress Notes (Signed)
Pulmonary Individual Treatment Plan  Patient Details  Name: Matthew Hall MRN: [id number] Date of Birth: 07/04/1946 Referring Provider:     PULMONARY REHAB CHRONIC OBSTRUCTIVE PULMONARY DISEASE from 01/03/2018 in [place]  Referring Provider  Dr. Elsworth Soho  (Pended)       Initial Encounter Date:    PULMONARY REHAB CHRONIC OBSTRUCTIVE PULMONARY DISEASE from 01/03/2018 in [place]  Date  12/24/17  (Pended)       Visit Diagnosis: Chronic obstructive pulmonary disease, unspecified COPD type ([place])  Patient's Home Medications on Admission:   Current Outpatient Medications:  .  albuterol (PROVENTIL HFA;VENTOLIN HFA) 108 (90 BASE) MCG/ACT inhaler, Inhale 2 puffs into the lungs every 6 (six) hours as needed for wheezing or shortness of breath., Disp: 1 Inhaler, Rfl: 6 .  ALPRAZolam (XANAX) 0.25 MG tablet, Take 0.25 mg by mouth 2 (two) times daily as needed for anxiety or sleep. , Disp: , Rfl: 3 .  budesonide-formoterol (SYMBICORT) 160-4.5 MCG/ACT inhaler, Inhale 2 puffs into the lungs 2 (two) times daily., Disp: 1 Inhaler, Rfl: 6 .  cetirizine (ZYRTEC) 5 MG tablet, Take 5 mg by mouth daily., Disp: , Rfl:  .  D3-50 50000 units capsule, Take 50,000 Units by mouth once a week. Usually Tuesday or Wednesday., Disp: , Rfl: 0 .  erythromycin ophthalmic ointment, Place 1 application into both eyes daily as needed (Infection)., Disp: , Rfl:  .  hydroxypropyl methylcellulose / hypromellose (ISOPTO TEARS / GONIOVISC) 2.5 % ophthalmic solution, Place 1 drop into both eyes as needed for dry eyes., Disp: , Rfl:  .  levalbuterol (XOPENEX) 0.63 MG/3ML nebulizer solution, Take 3 mLs (0.63 mg total) by nebulization every 6 (six) hours as needed for wheezing or shortness of breath., Disp: 75 mL, Rfl: 6 .  losartan-hydrochlorothiazide (HYZAAR) 50-12.5 MG tablet, Take 0.5 tablets by mouth 2 (two) times daily. , Disp: , Rfl:  .  methocarbamol (ROBAXIN) 500 MG tablet,  Take 1 tablet (500 mg total) by mouth every 8 (eight) hours as needed for muscle spasms., Disp: 20 tablet, Rfl: 0 .  metoprolol succinate (TOPROL-XL) 50 MG 24 hr tablet, Take 1 tablet (50 mg total) by mouth 2 (two) times daily., Disp: 60 tablet, Rfl: 0 .  Multiple Vitamin (MULTIVITAMIN WITH MINERALS) TABS tablet, Take 1 tablet by mouth daily., Disp: , Rfl:  .  omeprazole (PRILOSEC) 40 MG capsule, Take 40 mg by mouth daily., Disp: , Rfl:  .  triamcinolone (NASACORT) 55 MCG/ACT AERO nasal inhaler, Place 1 spray into the nose daily as needed (Congestion). , Disp: , Rfl:  .  warfarin (COUMADIN) 2.5 MG tablet, Take 2.5 mg by mouth daily., Disp: , Rfl:   Past Medical History: Past Medical History:  Diagnosis Date  . Anxiety   . Aortic atherosclerosis ([place])   . Atrial fibrillation ([place])   . Basal cell carcinoma   . Bladder cancer ([place])   . Bladder tumor   . Colon polyps   . COPD (chronic obstructive pulmonary disease) ([place])   . Cutaneous vasculitis   . Diverticulosis   . Essential hypertension   . GERD (gastroesophageal reflux disease)   . History of pneumonia   . Nephrolithiasis   . Osteoarthritis     Tobacco Use: Social History   Tobacco Use  Smoking Status Former Smoker  . Packs/day: 1.00  . Years: 30.00  . Pack years: 30.00  . Types: Cigarettes  . Last attempt to quit: 07/07/2011  . Years since quitting: 6.7  Smokeless Tobacco Never Used    Labs: Recent Review Flowsheet Data    Labs for ITP Cardiac and Pulmonary Rehab Latest Ref Rng & Units 07/24/2015 07/09/2016 08/09/2017   PHART 7.350 - 7.450 - - 7.459(H)   PCO2ART 32.0 - 48.0 mmHg - - 42.8   HCO3 20.0 - 28.0 mmol/L - - 29.5(H)   TCO2 0 - 100 mmol/L 26 27 -   O2SAT % - - 95.7      Capillary Blood Glucose: No results found for: GLUCAP   Pulmonary Assessment Scores: Pulmonary Assessment Scores    Row Name 12/24/17 1410         ADL UCSD   ADL Phase  Entry     SOB Score total  21     Rest  0     Walk  4      Stairs  3     Bath  0     Dress  0     Shop  2       CAT Score   CAT Score  13       mMRC Score   mMRC Score  2        Pulmonary Function Assessment: Pulmonary Function Assessment - 12/24/17 1400      Pulmonary Function Tests   FVC%  1.79 %    FEV1%  4.54 %    FEV1/FVC Ratio  53    RV%  73 %      Initial Spirometry Results   FVC%  1.83 %    FEV1%  40 %    FEV1/FVC Ratio  55      Post Bronchodilator Spirometry Results   FVC%  1.83 %    FEV1%  40 %      Breath   Bilateral Breath Sounds  Clear    Shortness of Breath  Yes       Exercise Target Goals: Exercise Program Goal: Individual exercise prescription set using results from initial 6 min walk test and THRR while considering  patient's activity barriers and safety.   Exercise Prescription Goal: Initial exercise prescription builds to 30-45 minutes a day of aerobic activity, 2-3 days per week.  Home exercise guidelines will be given to patient during program as part of exercise prescription that the participant will acknowledge.  Activity Barriers & Risk Stratification: Activity Barriers & Cardiac Risk Stratification - 12/24/17 1417      Activity Barriers & Cardiac Risk Stratification   Activity Barriers  Shortness of Breath    Cardiac Risk Stratification  Low       6 Minute Walk: 6 Minute Walk    Row Name 12/24/17 1415         6 Minute Walk   Phase  Initial     Distance  1250 feet     Distance % Change  0 %     Distance Feet Change  0 ft     Walk Time  6 minutes     # of Rest Breaks  0     MPH  2.36     METS  2.81     RPE  13     Perceived Dyspnea   11     VO2 Peak  10.17     Symptoms  Yes (comment)     Comments  Patient states that he felt a "funny" feeling in his head during walk test. Feeling went away after rest     Resting HR  101 bpm     Resting BP  98/62     Resting Oxygen Saturation   92 %     Exercise Oxygen Saturation  during 6 min walk  87 %     Max Ex. HR  112 bpm     Max Ex. BP   118/64     2 Minute Post BP  104/60        Oxygen Initial Assessment: Oxygen Initial Assessment - 12/24/17 1412      Home Oxygen   Home Oxygen Device  None    Sleep Oxygen Prescription  None    Home Exercise Oxygen Prescription  None    Home at Rest Exercise Oxygen Prescription  None    Compliance with Home Oxygen Use  --   N/A     Initial 6 min Walk   Oxygen Used  None      Program Oxygen Prescription   Program Oxygen Prescription  None       Oxygen Re-Evaluation: Oxygen Re-Evaluation    Row Name 02/14/18 1251 03/07/18 0752 03/28/18 0747         Program Oxygen Prescription   Program Oxygen Prescription  None  None  None       Home Oxygen   Home Oxygen Device  None  None  None     Sleep Oxygen Prescription  None  -  None     Home Exercise Oxygen Prescription  None  None  None     Home at Rest Exercise Oxygen Prescription  None  None  None       Goals/Expected Outcomes   Short Term Goals  To learn and understand importance of monitoring SPO2 with pulse oximeter and demonstrate accurate use of the pulse oximeter.;To learn and understand importance of maintaining oxygen saturations>88%;To learn and demonstrate proper pursed lip breathing techniques or other breathing techniques.;To learn and exhibit compliance with exercise, home and travel O2 prescription  To learn and understand importance of monitoring SPO2 with pulse oximeter and demonstrate accurate use of the pulse oximeter.;To learn and understand importance of maintaining oxygen saturations>88%;To learn and demonstrate proper pursed lip breathing techniques or other breathing techniques.;To learn and exhibit compliance with exercise, home and travel O2 prescription  To learn and understand importance of monitoring SPO2 with pulse oximeter and demonstrate accurate use of the pulse oximeter.;To learn and understand importance of maintaining oxygen saturations>88%;To learn and demonstrate proper pursed lip breathing  techniques or other breathing techniques.;To learn and exhibit compliance with exercise, home and travel O2 prescription     Long  Term Goals  Exhibits compliance with exercise, home and travel O2 prescription;Verbalizes importance of monitoring SPO2 with pulse oximeter and return demonstration;Maintenance of O2 saturations>88%;Exhibits proper breathing techniques, such as pursed lip breathing or other method taught during program session;Compliance with respiratory medication  Exhibits compliance with exercise, home and travel O2 prescription;Verbalizes importance of monitoring SPO2 with pulse oximeter and return demonstration;Maintenance of O2 saturations>88%;Exhibits proper breathing techniques, such as pursed lip breathing or other method taught during program session;Compliance with respiratory medication  Exhibits compliance with exercise, home and travel O2 prescription;Verbalizes importance of monitoring SPO2 with pulse oximeter and return demonstration;Maintenance of O2 saturations>88%;Exhibits proper breathing techniques, such as pursed lip breathing or other method taught during program session;Compliance with respiratory medication     Comments  Patient verbalizes importance of monitoring O2 and maintaining >88% and is able to demonstrate proper usage of pulse oximeter. He also is able to demonstrate  proper pursed lip breathing technique during exercise. Will conitnue to monitor.   Patient verbalizes importance of monitoring O2 and maintaining >88% and is able to demonstrate proper usage of pulse oximeter. He also is able to demonstrate proper pursed lip breathing technique during exercise. Will conitnue to monitor.   Patient verbalizes importance of monitoring O2 and maintaining >88% and is able to demonstrate proper usage of pulse oximeter. He also is able to demonstrate proper pursed lip breathing technique during exercise. Will conitnue to monitor.      Goals/Expected Outcomes  Patient will  continue to meet his short and long term goals.   Patient will continue to meet his short and long term goals.   Patient will continue to meet his short and long term goals.         Oxygen Discharge (Final Oxygen Re-Evaluation): Oxygen Re-Evaluation - 03/28/18 0747      Program Oxygen Prescription   Program Oxygen Prescription  None      Home Oxygen   Home Oxygen Device  None    Sleep Oxygen Prescription  None    Home Exercise Oxygen Prescription  None    Home at Rest Exercise Oxygen Prescription  None      Goals/Expected Outcomes   Short Term Goals  To learn and understand importance of monitoring SPO2 with pulse oximeter and demonstrate accurate use of the pulse oximeter.;To learn and understand importance of maintaining oxygen saturations>88%;To learn and demonstrate proper pursed lip breathing techniques or other breathing techniques.;To learn and exhibit compliance with exercise, home and travel O2 prescription    Long  Term Goals  Exhibits compliance with exercise, home and travel O2 prescription;Verbalizes importance of monitoring SPO2 with pulse oximeter and return demonstration;Maintenance of O2 saturations>88%;Exhibits proper breathing techniques, such as pursed lip breathing or other method taught during program session;Compliance with respiratory medication    Comments  Patient verbalizes importance of monitoring O2 and maintaining >88% and is able to demonstrate proper usage of pulse oximeter. He also is able to demonstrate proper pursed lip breathing technique during exercise. Will conitnue to monitor.     Goals/Expected Outcomes  Patient will continue to meet his short and long term goals.        Initial Exercise Prescription: Initial Exercise Prescription - 01/08/18 0700      Date of Initial Exercise RX and Referring Provider   Date  12/24/17  (Pended)     Referring Provider  Dr. Elsworth Soho  (Pended)       Treadmill   MPH  1.3    Grade  0    Minutes  15    METs  1.9       NuStep   Level  1    Minutes  20      Prescription Details   Frequency (times per week)  2  (Pended)       Intensity   THRR 40-80% of Max Heartrate  [phone]  (Pended)     Ratings of Perceived Exertion  11-13  (Pended)     Perceived Dyspnea  0-4  (Pended)       Progression   Progression  Continue progressive overload as per policy without signs/symptoms or physical distress.  (Pended)       Resistance Training   Training Prescription  Yes    Weight  1    Reps  10-15       Perform Capillary Blood Glucose checks as needed.  Exercise Prescription Changes:  Exercise Prescription  Changes    Row Name 01/08/18 0700 01/27/18 1200 02/13/18 1800 03/04/18 0700 03/15/18 1400     Response to Exercise   Blood Pressure (Admit)  88/50  110/76  120/64  118/60  130/64   Blood Pressure (Exercise)  98/50  138/68  136/80  140/70  142/62   Blood Pressure (Exit)  90/56  118/62  140/62  108/60  120/56   Heart Rate (Admit)  87 bpm  82 bpm  74 bpm  83 bpm  58 bpm   Heart Rate (Exercise)  89 bpm  82 bpm  93 bpm  89 bpm  88 bpm   Heart Rate (Exit)  84 bpm  87 bpm  76 bpm  74 bpm  68 bpm   Oxygen Saturation (Admit)  92 %  94 %  99 %  94 %  97 %   Oxygen Saturation (Exercise)  92 %  82 %  96 %  93 %  93 %   Oxygen Saturation (Exit)  94 %  92 %  95 %  94 %  98 %   Rating of Perceived Exertion (Exercise)  9  11  12  12  12    Perceived Dyspnea (Exercise)  9  11  12  12  12    Duration  Progress to 30 minutes of  aerobic without signs/symptoms of physical distress  Progress to 30 minutes of  aerobic without signs/symptoms of physical distress  Progress to 30 minutes of  aerobic without signs/symptoms of physical distress  Progress to 30 minutes of  aerobic without signs/symptoms of physical distress  Progress to 30 minutes of  aerobic without signs/symptoms of physical distress   Intensity  THRR New [id number]  THRR New 108-121-134  THRR New 103-118-132  THRR New 109-121-134  THRR unchanged      Progression   Progression  Continue to progress workloads to maintain intensity without signs/symptoms of physical distress.  Continue to progress workloads to maintain intensity without signs/symptoms of physical distress.  Continue to progress workloads to maintain intensity without signs/symptoms of physical distress.  Continue to progress workloads to maintain intensity without signs/symptoms of physical distress.  Continue to progress workloads to maintain intensity without signs/symptoms of physical distress.   Average METs  -  -  2.13  1.96  2.05     Resistance Training   Training Prescription  -  Yes  Yes  Yes  Yes   Weight  -  1  2  2  2    Reps  -  10-15  10-15  10-15  10-15   Time  -  5 Minutes  5 Minutes  5 Minutes  5 Minutes     Treadmill   MPH  -  1.3  1.5  1.6  1.7   Grade  -  0  0  0  0   Minutes  -  15  15  17  17    METs  -  1.99  2.14  2.22  2.3     NuStep   Level  -  0  2  2  3    SPM  79  80  87  78  75   Minutes  -  20  20  22  22    METs  1.9  1.6  2.12  1.7  1.8     Home Exercise Plan   Plans to continue exercise at  -  Home (comment)  Home (comment)  Home (comment)  Home (comment)   Frequency  -  Add 3 additional days to program exercise sessions.  Add 3 additional days to program exercise sessions.  Add 3 additional days to program exercise sessions.  Add 3 additional days to program exercise sessions.   Initial Home Exercises Provided  -  01/01/18  01/01/18  01/01/18  01/01/18      Exercise Comments:  Exercise Comments    Row Name 01/08/18 0755 02/13/18 1844 03/04/18 0752 03/15/18 1421 03/26/18 0825   Exercise Comments  Patient has just started PR. Patient will be progressed in time.   Patient feels much better, getting stronger and breathing better. Enjoys attending the program.   Patient feels much better, getting stronger and breathing better. Enjoys attending the program.   Patient is progressing well in the program, he feels as if it is helping to improve  his breathing and build strength.   Patient is progressing well in the program, he feels as if it is helping to improve his breathing and build strength.       Exercise Goals and Review:  Exercise Goals    Row Name 12/24/17 1418             Exercise Goals   Increase Physical Activity  Yes       Intervention  Provide advice, education, support and counseling about physical activity/exercise needs.;Develop an individualized exercise prescription for aerobic and resistive training based on initial evaluation findings, risk stratification, comorbidities and participant's personal goals.       Expected Outcomes  Short Term: Attend rehab on a regular basis to increase amount of physical activity.       Increase Strength and Stamina  Yes       Intervention  Provide advice, education, support and counseling about physical activity/exercise needs.;Develop an individualized exercise prescription for aerobic and resistive training based on initial evaluation findings, risk stratification, comorbidities and participant's personal goals.       Expected Outcomes  Short Term: Increase workloads from initial exercise prescription for resistance, speed, and METs.       Able to understand and use rate of perceived exertion (RPE) scale  Yes       Intervention  Provide education and explanation on how to use RPE scale       Expected Outcomes  Long Term:  Able to use RPE to guide intensity level when exercising independently;Short Term: Able to use RPE daily in rehab to express subjective intensity level       Able to understand and use Dyspnea scale  Yes       Intervention  Provide education and explanation on how to use Dyspnea scale       Expected Outcomes  Short Term: Able to use Dyspnea scale daily in rehab to express subjective sense of shortness of breath during exertion;Long Term: Able to use Dyspnea scale to guide intensity level when exercising independently       Knowledge and understanding of Target  Heart Rate Range (THRR)  Yes       Intervention  Provide education and explanation of THRR including how the numbers were predicted and where they are located for reference       Expected Outcomes  Short Term: Able to state/look up THRR;Long Term: Able to use THRR to govern intensity when exercising independently;Short Term: Able to use daily as guideline for intensity in rehab       Able to check pulse independently  Yes  Intervention  Provide education and demonstration on how to check pulse in carotid and radial arteries.;Review the importance of being able to check your own pulse for safety during independent exercise       Expected Outcomes  Short Term: Able to explain why pulse checking is important during independent exercise;Long Term: Able to check pulse independently and accurately       Understanding of Exercise Prescription  Yes       Intervention  Provide education, explanation, and written materials on patient's individual exercise prescription       Expected Outcomes  Short Term: Able to explain program exercise prescription;Long Term: Able to explain home exercise prescription to exercise independently          Exercise Goals Re-Evaluation : Exercise Goals Re-Evaluation    Row Name 01/14/18 1437 02/13/18 1840 03/04/18 0747 03/15/18 1417 03/26/18 0823     Exercise Goal Re-Evaluation   Exercise Goals Review  Increase Physical Activity;Increase Strength and Stamina;Able to understand and use rate of perceived exertion (RPE) scale;Improve claudication pain tolerance and improve walking ability;Knowledge and understanding of Target Heart Rate Range (THRR);Able to understand and use Dyspnea scale;Understanding of Exercise Prescription  Increase Strength and Stamina;Able to understand and use Dyspnea scale;Knowledge and understanding of Target Heart Rate Range (THRR);Able to understand and use rate of perceived exertion (RPE) scale;Understanding of Exercise Prescription  Increase  Strength and Stamina;Able to understand and use rate of perceived exertion (RPE) scale;Able to understand and use Dyspnea scale;Understanding of Exercise Prescription  Increase Strength and Stamina;Able to understand and use rate of perceived exertion (RPE) scale;Able to understand and use Dyspnea scale;Understanding of Exercise Prescription  Increase Strength and Stamina;Able to understand and use rate of perceived exertion (RPE) scale;Able to understand and use Dyspnea scale;Understanding of Exercise Prescription   Comments  Patient has only been to PR for 3 sessions so far. Patient will be progressed in time.   Patient has been able to tolerate increases with wieghts and equipment workloads. He feels he is getting stronger. He also feels he is breathing better. 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 this has not affected his ability to exercise.   Patient has been able to tolerate increases with weight and equipment workloads. he is getting stronger. We have a hard to getting his SaO2 when on TM. Overall he is doing well.   Patient is doing well in rehab. He has had 18 sessions so far and is tolerating progressions. He has increased his workload on the Nu Step to level 3.   Patient is doing well in rehab. He has had 18 sessions so far and is tolerating progressions. He has increased his workload on the Nu Step to level 3. Increased weights to 3 lbs.    Expected Outcomes  Patient wishes to increase strength and to be able to breathe better   To cintinue to build strength and his breathing to continue to improve.   To cintinue to build strength and his breathing to continue to improve.   Continue to build strength and improve his breathing.   Continue to build strength and improve his breathing.       Discharge Exercise Prescription (Final Exercise Prescription Changes): Exercise Prescription Changes - 03/15/18 1400      Response to Exercise   Blood Pressure (Admit)  130/64     Blood Pressure (Exercise)  142/62    Blood Pressure (Exit)  120/56    Heart Rate (  Admit)  58 bpm    Heart Rate (Exercise)  88 bpm    Heart Rate (Exit)  68 bpm    Oxygen Saturation (Admit)  97 %    Oxygen Saturation (Exercise)  93 %    Oxygen Saturation (Exit)  98 %    Rating of Perceived Exertion (Exercise)  12    Perceived Dyspnea (Exercise)  12    Duration  Progress to 30 minutes of  aerobic without signs/symptoms of physical distress    Intensity  THRR unchanged      Progression   Progression  Continue to progress workloads to maintain intensity without signs/symptoms of physical distress.    Average METs  2.05      Resistance Training   Training Prescription  Yes    Weight  2    Reps  10-15    Time  5 Minutes      Treadmill   MPH  1.7    Grade  0    Minutes  17    METs  2.3      NuStep   Level  3    SPM  75    Minutes  22    METs  1.8      Home Exercise Plan   Plans to continue exercise at  Home (comment)    Frequency  Add 3 additional days to program exercise sessions.    Initial Home Exercises Provided  01/01/18       Nutrition:  Target Goals: Understanding of nutrition guidelines, daily intake of sodium 1500mg , cholesterol 200mg , calories 30% from fat and 7% or less from saturated fats, daily to have 5 or more servings of fruits and vegetables.  Biometrics: Pre Biometrics - 12/24/17 1419      Pre Biometrics   Height  5\' 11"  (1.803 m)    Weight  80.4 kg    Waist Circumference  40 inches    Hip Circumference  37 inches    Waist to Hip Ratio  1.08 %    BMI (Calculated)  24.73    Triceps Skinfold  16 mm    % Body Fat  26.7 %    Grip Strength  79.33 kg    Flexibility  0 in    Single Leg Stand  3 seconds        Nutrition Therapy Plan and Nutrition Goals: Nutrition Therapy & Goals - 03/28/18 0748      Nutrition Therapy   RD appointment deferred  Yes      Personal Nutrition Goals   Nutrition Goal  RD appointment deferred. Patient not trying to  change his diet.     Additional Goals?  No       Nutrition Assessments: Nutrition Assessments - 12/24/17 1443      MEDFICTS Scores   Pre Score  48       Nutrition Goals Re-Evaluation:   Nutrition Goals Discharge (Final Nutrition Goals Re-Evaluation):   Psychosocial: Target Goals: Acknowledge presence or absence of significant depression and/or stress, maximize coping skills, provide positive support system. Participant is able to verbalize types and ability to use techniques and skills needed for reducing stress and depression.  Initial Review & Psychosocial Screening: Initial Psych Review & Screening - 12/24/17 1445      Initial Review   Current issues with  None Identified      Family Dynamics   Good Support System?  Yes      Barriers   Psychosocial barriers to  participate in program  There are no identifiable barriers or psychosocial needs.      Screening Interventions   Interventions  Encouraged to exercise    Expected Outcomes  Short Term goal: Identification and review with participant of any Quality of Life or Depression concerns found by scoring the questionnaire.;Long Term goal: The participant improves quality of Life and PHQ9 Scores as seen by post scores and/or verbalization of changes       Quality of Life Scores: Quality of Life - 12/24/17 1419      Quality of Life Scores   Health/Function Pre  16.16 %    Socioeconomic Pre  19.67 %    Psych/Spiritual Pre  24.64 %    Family Pre  14.5 %    GLOBAL Pre  18.28 %      Scores of 19 and below usually indicate a poorer quality of life in these areas.  A difference of  2-3 points is a clinically meaningful difference.  A difference of 2-3 points in the total score of the Quality of Life Index has been associated with significant improvement in overall quality of life, self-image, physical symptoms, and general health in studies assessing change in quality of life.   PHQ-9: Recent Review Flowsheet Data     Depression screen [facility] 2/9 12/24/2017 05/21/2017 03/06/2017 02/20/2017 08/06/2014   Decreased Interest 0 0 0 0 0   Down, Depressed, Hopeless 0 0 0 0 0   PHQ - 2 Score 0 0 0 0 0   Altered sleeping 1 - - 1  -   Tired, decreased energy 2 - - 1  -   Change in appetite 2 - - 1  -   Feeling bad or failure about yourself  0 - - 0 -   Trouble concentrating 0 - - 0 -   Moving slowly or fidgety/restless 0 - - 0 -   Suicidal thoughts 0 - - 0 -   PHQ-9 Score 5 - - 3 -   Difficult doing work/chores Somewhat difficult - - Somewhat difficult -     Interpretation of Total Score  Total Score Depression Severity:  1-4 = Minimal depression, 5-9 = Mild depression, 10-14 = Moderate depression, 15-19 = Moderately severe depression, 20-27 = Severe depression   Psychosocial Evaluation and Intervention: Psychosocial Evaluation - 12/24/17 1445      Psychosocial Evaluation & Interventions   Interventions  Encouraged to exercise with the program and follow exercise prescription    Continue Psychosocial Services   No Follow up required       Psychosocial Re-Evaluation: Psychosocial Re-Evaluation    Row Name 02/14/18 1256 03/07/18 0752 03/28/18 0751         Psychosocial Re-Evaluation   Current issues with  None Identified  None Identified  None Identified     Comments  Patient's initial QOL score was 18.28 and his PHQ-9 score was 5. He says he is not depressed. No psychosocial issues identified.   Patient's initial QOL score was 18.28 and his PHQ-9 score was 5. He says he is not depressed. No psychosocial issues identified.   Patient's initial QOL score was 18.28 and his PHQ-9 score was 5. No psychosocial issues identified.      Expected Outcomes  Patient will not have any psychosocial issues identified at discharge and his QOL and PHQ-9 scores will improve at discharge.   Patient will not have any psychosocial issues identified at discharge and his QOL and PHQ-9 scores will improve  at discharge.   Patient will  not have any psychosocial issues identified at discharge and his QOL and PHQ-9 scores will improve at discharge.      Interventions  Stress management education;Relaxation education;Encouraged to attend Pulmonary Rehabilitation for the exercise  Stress management education;Relaxation education;Encouraged to attend Pulmonary Rehabilitation for the exercise  Stress management education;Relaxation education;Encouraged to attend Pulmonary Rehabilitation for the exercise     Continue Psychosocial Services   No Follow up required  No Follow up required  No Follow up required        Psychosocial Discharge (Final Psychosocial Re-Evaluation): Psychosocial Re-Evaluation - 03/28/18 0751      Psychosocial Re-Evaluation   Current issues with  None Identified    Comments  Patient's initial QOL score was 18.28 and his PHQ-9 score was 5. No psychosocial issues identified.     Expected Outcomes  Patient will not have any psychosocial issues identified at discharge and his QOL and PHQ-9 scores will improve at discharge.     Interventions  Stress management education;Relaxation education;Encouraged to attend Pulmonary Rehabilitation for the exercise    Continue Psychosocial Services   No Follow up required        Education: Education Goals: Education classes will be provided on a weekly basis, covering required topics. Participant will state understanding/return demonstration of topics presented.  Learning Barriers/Preferences: Learning Barriers/Preferences - 12/24/17 1341      Learning Barriers/Preferences   Learning Barriers  None    Learning Preferences  Verbal Instruction;Skilled Demonstration;Group Instruction       Education Topics: How Lungs Work and Diseases: - Discuss the anatomy of the lungs and diseases that can affect the lungs, such as COPD.   PULMONARY REHAB CHRONIC OBSTRUCTIVE PULMONARY DISEASE from 03/21/2018 in [place]  Date  02/14/18  Educator  DC    Instruction Review Code  2- Demonstrated Understanding      Exercise: -Discuss the importance of exercise, FITT principles of exercise, normal and abnormal responses to exercise, and how to exercise safely.   Environmental Irritants: -Discuss types of environmental irritants and how to limit exposure to environmental irritants.   Meds/Inhalers and oxygen: - Discuss respiratory medications, definition of an inhaler and oxygen, and the proper way to use an inhaler and oxygen.   PULMONARY REHAB CHRONIC OBSTRUCTIVE PULMONARY DISEASE from 03/21/2018 in [place]  Date  02/28/18  Educator  D. Wynetta Emery      Energy Saving Techniques: - Discuss methods to conserve energy and decrease shortness of breath when performing activities of daily living.    PULMONARY REHAB CHRONIC OBSTRUCTIVE PULMONARY DISEASE from 03/21/2018 in [place]  Date  03/07/18  Educator  D. Coad  Instruction Review Code  2- Demonstrated Understanding      Bronchial Hygiene / Breathing Techniques: - Discuss breathing mechanics, pursed-lip breathing technique,  proper posture, effective ways to clear airways, and other functional breathing techniques   PULMONARY REHAB CHRONIC OBSTRUCTIVE PULMONARY DISEASE from 03/21/2018 in [place]  Date  03/14/18  Educator  D. Coad  Instruction Review Code  2- Demonstrated Understanding      Cleaning Equipment: - Provides group verbal and written instruction about the health risks of elevated stress, cause of high stress, and healthy ways to reduce stress.   PULMONARY REHAB CHRONIC OBSTRUCTIVE PULMONARY DISEASE from 03/21/2018 in [place]  Date  03/21/18  Educator  D. Coad  Instruction Review Code  2- Demonstrated Understanding  Nutrition I: Fats: - Discuss the types of cholesterol, what cholesterol does to the body, and how cholesterol levels can be controlled.   Nutrition  II: Labels: -Discuss the different components of food labels and how to read food labels.   PULMONARY REHAB CHRONIC OBSTRUCTIVE PULMONARY DISEASE from 03/21/2018 in [place]  Date  01/03/18  Educator  D. Coad  Instruction Review Code  2- Demonstrated Understanding      Respiratory Infections: - Discuss the signs and symptoms of respiratory infections, ways to prevent respiratory infections, and the importance of seeking medical treatment when having a respiratory infection.   Stress I: Signs and Symptoms: - Discuss the causes of stress, how stress may lead to anxiety and depression, and ways to limit stress.   Stress II: Relaxation: -Discuss relaxation techniques to limit stress.   PULMONARY REHAB CHRONIC OBSTRUCTIVE PULMONARY DISEASE from 03/21/2018 in [place]  Date  01/24/18  Educator  D. Coad  Instruction Review Code  2- Demonstrated Understanding      Oxygen for Home/Travel: - Discuss how to prepare for travel when on oxygen and proper ways to transport and store oxygen to ensure safety.   PULMONARY REHAB CHRONIC OBSTRUCTIVE PULMONARY DISEASE from 03/21/2018 in [place]  Date  01/31/18  Educator  D. Coad  Instruction Review Code  2- Demonstrated Understanding      Knowledge Questionnaire Score: Knowledge Questionnaire Score - 12/24/17 1342      Knowledge Questionnaire Score   Pre Score  16/18       Core Components/Risk Factors/Patient Goals at Admission: Personal Goals and Risk Factors at Admission - 12/24/17 1444      Core Components/Risk Factors/Patient Goals on Admission    Weight Management  Weight Maintenance    Personal Goal Other  Yes    Personal Goal  Build strength, breathe better    Intervention  Attend PR 2 x week and supplement at home exercise 3 x week.     Expected Outcomes  Achieve personal goals.        Core Components/Risk Factors/Patient Goals Review:  Goals and Risk  Factor Review    Row Name 02/14/18 1253 03/07/18 0747 03/28/18 0748         Core Components/Risk Factors/Patient Goals Review   Personal Goals Review  Weight Management/Obesity;Develop more efficient breathing techniques such as purse lipped breathing and diaphragmatic breathing and practicing self-pacing with activity.;Improve shortness of breath with ADL's Build strength; breathe better; improve lung capacity.  Weight Management/Obesity;Develop more efficient breathing techniques such as purse lipped breathing and diaphragmatic breathing and practicing self-pacing with activity.;Improve shortness of breath with ADL's Build strength; breathe better; improve lung capacity.   Weight Management/Obesity;Develop more efficient breathing techniques such as purse lipped breathing and diaphragmatic breathing and practicing self-pacing with activity.;Improve shortness of breath with ADL's Build strength; breathe better; improve lung capacity.     Review  Patient has completed 12 sessions gaining 3 lbs since he started the program. He is doing well in the program with progression. He says he does feel a little stronger and is breathing a little better. He hopes to improve more as he continues the program but is pleased with his progress so far. Will continue to monitor for progress.   Patient has completed 15 sessions losing 1 lb since last 30 day review. He continues to do well in the program with progression. He says she program is helping him have less SOB. He continues to  feel stronger and better overall. He does demonstrate proper pursed lip breathing in the sessions if needed. Will continue to monitor for progress.   Patient has completed 19 sessions losing 2 lbs since last 30 day review. He continues to do well in the program with progression. He continues to say the program is helping him. He feel stronger and is able to work outside more with less SOB. He is able to use a chainsaw. He says he feels stronger  and better overall. Will continue to monitor for progress.      Expected Outcomes  Patient will continue to attend sessions and complete the program meeting his personal goals.   Patient will continue to attend sessions and complete the program meeting his personal goals.   Patient will continue to attend sessions and complete the program meeting his personal goals.         Core Components/Risk Factors/Patient Goals at Discharge (Final Review):  Goals and Risk Factor Review - 03/28/18 0748      Core Components/Risk Factors/Patient Goals Review   Personal Goals Review  Weight Management/Obesity;Develop more efficient breathing techniques such as purse lipped breathing and diaphragmatic breathing and practicing self-pacing with activity.;Improve shortness of breath with ADL's   Build strength; breathe better; improve lung capacity.   Review  Patient has completed 19 sessions losing 2 lbs since last 30 day review. He continues to do well in the program with progression. He continues to say the program is helping him. He feel stronger and is able to work outside more with less SOB. He is able to use a chainsaw. He says he feels stronger and better overall. Will continue to monitor for progress.     Expected Outcomes  Patient will continue to attend sessions and complete the program meeting his personal goals.        ITP Comments: ITP Comments    Row Name 12/24/17 1355 01/16/18 1405         ITP Comments  Mr. Frayre has been in the program before.   Patient new to program. He has completed 4 sessions. Will continue to monitor for progress.          Comments: ITP REVIEW Pt is making expected progress toward pulmonary rehab goals after completing 19 sessions. Recommend continued exercise, life style modification, education, and utilization of breathing techniques to increase stamina and strength and decrease shortness of breath with exertion.

## 2018-03-28 NOTE — Progress Notes (Signed)
Daily Session Note  Patient Details  Name: Matthew Hall MRN: [id number] Date of Birth: 1945-11-02 Referring Provider:     PULMONARY REHAB CHRONIC OBSTRUCTIVE PULMONARY DISEASE from 01/03/2018 in [place]  Referring Provider  Dr. Elsworth Soho  [facility])       Encounter Date: 03/28/2018  Check In: Session Check In - 03/28/18 1045      Check-In   Supervising physician immediately available to respond to emergencies  See telemetry face sheet for immediately available MD    Location  [facility]    Staff Present  Russella Dar, MS, EP, [facility], Exercise Physiologist;Tommie Dejoseph Wynetta Emery, RN, BSN    Medication changes reported      No    Fall or balance concerns reported     No    Warm-up and Cool-down  Performed as group-led instruction    Resistance Training Performed  Yes    VAD Patient?  No    PAD/SET Patient?  No      Pain Assessment   Currently in Pain?  No/denies    Pain Score  0-No pain    Multiple Pain Sites  No       Capillary Blood Glucose: No results found for this or any previous visit (from the past 24 hour(s)).    Social History   Tobacco Use  Smoking Status Former Smoker  . Packs/day: 1.00  . Years: 30.00  . Pack years: 30.00  . Types: Cigarettes  . Last attempt to quit: 07/07/2011  . Years since quitting: 6.7  Smokeless Tobacco Never Used    Goals Met:  Proper associated with RPD/PD & O2 Sat Independence with exercise equipment Improved SOB with ADL's Using PLB without cueing & demonstrates good technique Exercise tolerated well No report of cardiac concerns or symptoms  Goals Unmet:  Not Applicable  Comments: Pt able to follow exercise prescription today without complaint.  Will continue to monitor for progression. Check out 1145.   Dr. Sinda Du is Medical Director for [facility] Pulmonary Rehab.

## 2018-04-02 ENCOUNTER — Encounter (HOSPITAL_COMMUNITY)
Admission: RE | Admit: 2018-04-02 | Discharge: 2018-04-02 | Disposition: A | Discharge status: Home / Self Care | Payer: Medicare Other | Source: Ambulatory Visit | Attending: Pulmonary Disease | Admitting: Pulmonary Disease

## 2018-04-02 DIAGNOSIS — J439 Emphysema, unspecified: Secondary | ICD-10-CM | POA: Diagnosis not present

## 2018-04-02 DIAGNOSIS — J449 Chronic obstructive pulmonary disease, unspecified: Secondary | ICD-10-CM

## 2018-04-02 NOTE — Progress Notes (Signed)
Daily Session Note  Patient Details  Name: LENNOX DOLBERRY MRN: [id number] Date of Birth: 08/21/1945 Referring Provider:     PULMONARY REHAB CHRONIC OBSTRUCTIVE PULMONARY DISEASE from 01/03/2018 in [place]  Referring Provider  Dr. Elsworth Soho  [facility])       Encounter Date: 04/02/2018  Check In: Session Check In - 04/02/18 1045      Check-In   Supervising physician immediately available to respond to emergencies  See telemetry face sheet for immediately available MD    Location  [facility]    Staff Present  Russella Dar, MS, EP, [facility], Exercise Physiologist;Lexis Potenza Zachery Conch, Exercise Physiologist;Other    Medication changes reported      No    Fall or balance concerns reported     No    Tobacco Cessation  No Change    Warm-up and Cool-down  Performed as group-led instruction    Resistance Training Performed  Yes    VAD Patient?  No    PAD/SET Patient?  No      Pain Assessment   Currently in Pain?  No/denies    Pain Score  0-No pain    Multiple Pain Sites  No       Capillary Blood Glucose: No results found for this or any previous visit (from the past 24 hour(s)).    Social History   Tobacco Use  Smoking Status Former Smoker  . Packs/day: 1.00  . Years: 30.00  . Pack years: 30.00  . Types: Cigarettes  . Last attempt to quit: 07/07/2011  . Years since quitting: 6.7  Smokeless Tobacco Never Used    Goals Met:  Proper associated with RPD/PD & O2 Sat Independence with exercise equipment Using PLB without cueing & demonstrates good technique Exercise tolerated well No report of cardiac concerns or symptoms Strength training completed today  Goals Unmet:  Not Applicable  Comments: Pt able to follow exercise prescription today without complaint.  Will continue to monitor for progression. Check out 11:45.    Dr. Sinda Du is Medical Director for [facility] Pulmonary Rehab.

## 2018-04-04 ENCOUNTER — Encounter (HOSPITAL_COMMUNITY)
Admission: RE | Admit: 2018-04-04 | Discharge: 2018-04-04 | Disposition: A | Discharge status: Home / Self Care | Payer: Medicare Other | Source: Ambulatory Visit | Attending: Pulmonary Disease | Admitting: Pulmonary Disease

## 2018-04-04 DIAGNOSIS — J439 Emphysema, unspecified: Secondary | ICD-10-CM | POA: Diagnosis not present

## 2018-04-04 DIAGNOSIS — J449 Chronic obstructive pulmonary disease, unspecified: Secondary | ICD-10-CM

## 2018-04-04 NOTE — Progress Notes (Signed)
Daily Session Note  Patient Details  Name: Matthew Hall MRN: [id number] Date of Birth: [birth year] Referring Provider:     PULMONARY REHAB CHRONIC OBSTRUCTIVE PULMONARY DISEASE from 01/03/2018 in [place]  Referring Provider  Dr. Elsworth Soho  [facility])       Encounter Date: 04/04/2018  Check In: Session Check In - 04/04/18 1045      Check-In   Supervising physician immediately available to respond to emergencies  See telemetry face sheet for immediately available MD    Location  [facility]    Staff Present  Russella Dar, MS, EP, [facility], Exercise Physiologist;Amanda Ballard, Exercise Physiologist;Brad Mcgaughy Wynetta Emery, RN, BSN    Medication changes reported      No    Fall or balance concerns reported     No    Warm-up and Cool-down  Performed as group-led instruction    Resistance Training Performed  Yes    VAD Patient?  No    PAD/SET Patient?  No      Pain Assessment   Currently in Pain?  No/denies    Pain Score  0-No pain    Multiple Pain Sites  No       Capillary Blood Glucose: No results found for this or any previous visit (from the past 24 hour(s)).    Social History   Tobacco Use  Smoking Status Former Smoker  . Packs/day: 1.00  . Years: 30.00  . Pack years: 30.00  . Types: Cigarettes  . Last attempt to quit: 07/07/2011  . Years since quitting: 6.7  Smokeless Tobacco Never Used    Goals Met:  Proper associated with RPD/PD & O2 Sat Independence with exercise equipment Improved SOB with ADL's Using PLB without cueing & demonstrates good technique Exercise tolerated well No report of cardiac concerns or symptoms Strength training completed today  Goals Unmet:  Not Applicable  Comments: Pt able to follow exercise prescription today without complaint.  Will continue to monitor for progression. Check out 1145.   Dr. Sinda Du is Medical Director for [facility] Pulmonary Rehab.

## 2018-04-09 ENCOUNTER — Encounter (HOSPITAL_COMMUNITY)
Admission: RE | Admit: 2018-04-09 | Discharge: 2018-04-09 | Disposition: A | Discharge status: Home / Self Care | Payer: Medicare Other | Source: Ambulatory Visit | Attending: Pulmonary Disease | Admitting: Pulmonary Disease

## 2018-04-09 DIAGNOSIS — J449 Chronic obstructive pulmonary disease, unspecified: Secondary | ICD-10-CM | POA: Diagnosis present

## 2018-04-09 DIAGNOSIS — J439 Emphysema, unspecified: Secondary | ICD-10-CM | POA: Diagnosis not present

## 2018-04-09 DIAGNOSIS — Z87891 Personal history of nicotine dependence: Secondary | ICD-10-CM | POA: Insufficient documentation

## 2018-04-09 DIAGNOSIS — I10 Essential (primary) hypertension: Secondary | ICD-10-CM | POA: Insufficient documentation

## 2018-04-09 DIAGNOSIS — Z85828 Personal history of other malignant neoplasm of skin: Secondary | ICD-10-CM | POA: Diagnosis not present

## 2018-04-09 DIAGNOSIS — Z7901 Long term (current) use of anticoagulants: Secondary | ICD-10-CM | POA: Diagnosis not present

## 2018-04-09 DIAGNOSIS — Z79899 Other long term (current) drug therapy: Secondary | ICD-10-CM | POA: Insufficient documentation

## 2018-04-09 DIAGNOSIS — I4891 Unspecified atrial fibrillation: Secondary | ICD-10-CM | POA: Insufficient documentation

## 2018-04-09 DIAGNOSIS — C679 Malignant neoplasm of bladder, unspecified: Secondary | ICD-10-CM | POA: Diagnosis not present

## 2018-04-09 DIAGNOSIS — F419 Anxiety disorder, unspecified: Secondary | ICD-10-CM | POA: Diagnosis not present

## 2018-04-09 DIAGNOSIS — Z87442 Personal history of urinary calculi: Secondary | ICD-10-CM | POA: Insufficient documentation

## 2018-04-09 DIAGNOSIS — Z86010 Personal history of colonic polyps: Secondary | ICD-10-CM | POA: Insufficient documentation

## 2018-04-09 DIAGNOSIS — K219 Gastro-esophageal reflux disease without esophagitis: Secondary | ICD-10-CM | POA: Diagnosis not present

## 2018-04-09 DIAGNOSIS — Z7951 Long term (current) use of inhaled steroids: Secondary | ICD-10-CM | POA: Insufficient documentation

## 2018-04-09 NOTE — Progress Notes (Signed)
Daily Session Note  Patient Details  Name: Matthew Hall MRN: [id number] Date of Birth: Dec 19, 1945 Referring Provider:     PULMONARY REHAB CHRONIC OBSTRUCTIVE PULMONARY DISEASE from 01/03/2018 in [place]  Referring Provider  Dr. Elsworth Soho  [facility])       Encounter Date: 04/09/2018  Check In: Session Check In - 04/09/18 1045      Check-In   Supervising physician immediately available to respond to emergencies  See telemetry face sheet for immediately available MD    Location  [facility]    Staff Present  Russella Dar, MS, EP, [facility], Exercise Physiologist;Sherl Yzaguirre Zachery Conch, Exercise Physiologist    Medication changes reported      No    Fall or balance concerns reported     No    Tobacco Cessation  No Change    Warm-up and Cool-down  Performed as group-led instruction    Resistance Training Performed  Yes    VAD Patient?  No    PAD/SET Patient?  No      Pain Assessment   Currently in Pain?  No/denies    Pain Score  0-No pain    Multiple Pain Sites  No       Capillary Blood Glucose: No results found for this or any previous visit (from the past 24 hour(s)).    Social History   Tobacco Use  Smoking Status Former Smoker  . Packs/day: 1.00  . Years: 30.00  . Pack years: 30.00  . Types: Cigarettes  . Last attempt to quit: 07/07/2011  . Years since quitting: 6.7  Smokeless Tobacco Never Used    Goals Met:  Proper associated with RPD/PD & O2 Sat Independence with exercise equipment Using PLB without cueing & demonstrates good technique Exercise tolerated well No report of cardiac concerns or symptoms Strength training completed today  Goals Unmet:  Not Applicable  Comments: Pt able to follow exercise prescription today without complaint.  Will continue to monitor for progression. Check out 11:45.    Dr. Sinda Du is Medical Director for [facility] Pulmonary Rehab.

## 2018-04-11 ENCOUNTER — Encounter (HOSPITAL_COMMUNITY): Payer: Medicare Other

## 2018-04-16 ENCOUNTER — Encounter (HOSPITAL_COMMUNITY)
Admission: RE | Admit: 2018-04-16 | Discharge: 2018-04-16 | Disposition: A | Discharge status: Home / Self Care | Payer: Medicare Other | Source: Ambulatory Visit | Attending: Pulmonary Disease | Admitting: Pulmonary Disease

## 2018-04-16 DIAGNOSIS — J449 Chronic obstructive pulmonary disease, unspecified: Secondary | ICD-10-CM

## 2018-04-16 DIAGNOSIS — J439 Emphysema, unspecified: Secondary | ICD-10-CM | POA: Diagnosis not present

## 2018-04-16 NOTE — Progress Notes (Signed)
Daily Session Note  Patient Details  Name: Matthew Hall MRN: [id number] Date of Birth: [birth year] Referring Provider:     PULMONARY REHAB CHRONIC OBSTRUCTIVE PULMONARY DISEASE from 01/03/2018 in [place]  Referring Provider  Dr. Elsworth Soho  [facility])       Encounter Date: 04/16/2018  Check In: Session Check In - 04/16/18 1045      Check-In   Supervising physician immediately available to respond to emergencies  See telemetry face sheet for immediately available MD    Location  [facility]    Staff Present  Russella Dar, MS, EP, [facility], Exercise Physiologist;Valicia Rief Zachery Conch, Exercise Physiologist    Medication changes reported      No    Fall or balance concerns reported     No    Tobacco Cessation  No Change    Warm-up and Cool-down  Performed as group-led instruction    Resistance Training Performed  Yes    VAD Patient?  No    PAD/SET Patient?  No      Pain Assessment   Currently in Pain?  No/denies    Pain Score  0-No pain    Multiple Pain Sites  No       Capillary Blood Glucose: No results found for this or any previous visit (from the past 24 hour(s)).    Social History   Tobacco Use  Smoking Status Former Smoker  . Packs/day: 1.00  . Years: 30.00  . Pack years: 30.00  . Types: Cigarettes  . Last attempt to quit: 07/07/2011  . Years since quitting: 6.7  Smokeless Tobacco Never Used    Goals Met:  Proper associated with RPD/PD & O2 Sat Independence with exercise equipment Using PLB without cueing & demonstrates good technique Exercise tolerated well No report of cardiac concerns or symptoms Strength training completed today  Goals Unmet:  Not Applicable  Comments: Pt able to follow exercise prescription today without complaint.  Will continue to monitor for progression. Check out 11:45.   Dr. Sinda Du is Medical Director for [facility] Pulmonary Rehab.

## 2018-04-18 ENCOUNTER — Encounter (HOSPITAL_COMMUNITY)
Admission: RE | Admit: 2018-04-18 | Discharge: 2018-04-18 | Disposition: A | Discharge status: Home / Self Care | Payer: Medicare Other | Source: Ambulatory Visit | Attending: Pulmonary Disease | Admitting: Pulmonary Disease

## 2018-04-18 DIAGNOSIS — J439 Emphysema, unspecified: Secondary | ICD-10-CM | POA: Diagnosis not present

## 2018-04-18 DIAGNOSIS — J449 Chronic obstructive pulmonary disease, unspecified: Secondary | ICD-10-CM

## 2018-04-18 NOTE — Progress Notes (Signed)
Daily Session Note  Patient Details  Name: EMILIO BAYLOCK MRN: [id number] Date of Birth: 03-Mar-1946 Referring Provider:     PULMONARY REHAB CHRONIC OBSTRUCTIVE PULMONARY DISEASE from 01/03/2018 in [place]  Referring Provider  Dr. Elsworth Soho  [facility])       Encounter Date: 04/18/2018  Check In: Session Check In - 04/18/18 1045      Check-In   Supervising physician immediately available to respond to emergencies  See telemetry face sheet for immediately available MD    Location  [facility]    Staff Present  Benay Pike, Exercise Physiologist;Debra Wynetta Emery, RN, BSN    Medication changes reported      No    Fall or balance concerns reported     No    Tobacco Cessation  No Change    Warm-up and Cool-down  Performed as group-led instruction    Resistance Training Performed  Yes    VAD Patient?  No    PAD/SET Patient?  No      Pain Assessment   Currently in Pain?  No/denies    Pain Score  0-No pain    Multiple Pain Sites  No       Capillary Blood Glucose: No results found for this or any previous visit (from the past 24 hour(s)).    Social History   Tobacco Use  Smoking Status Former Smoker  . Packs/day: 1.00  . Years: 30.00  . Pack years: 30.00  . Types: Cigarettes  . Last attempt to quit: 07/07/2011  . Years since quitting: 6.7  Smokeless Tobacco Never Used    Goals Met:  Independence with exercise equipment Using PLB without cueing & demonstrates good technique Exercise tolerated well No report of cardiac concerns or symptoms Strength training completed today  Goals Unmet:  Not Applicable  Comments: Pt able to follow exercise prescription today without complaint.  Will continue to monitor for progression. Check out 11:45.   Dr. Sinda Du is Medical Director for [facility] Pulmonary Rehab.

## 2018-04-18 NOTE — Progress Notes (Signed)
Pulmonary Individual Treatment Plan  Patient Details  Name: Matthew Hall MRN: [id number] Date of Birth: 1945/10/26 Referring Provider:     PULMONARY REHAB CHRONIC OBSTRUCTIVE PULMONARY DISEASE from 01/03/2018 in [place]  Referring Provider  Dr. Elsworth Soho  (Pended)       Initial Encounter Date:    PULMONARY REHAB CHRONIC OBSTRUCTIVE PULMONARY DISEASE from 01/03/2018 in [place]  Date  12/24/17  (Pended)       Visit Diagnosis: Chronic obstructive pulmonary disease, unspecified COPD type ([place])  Patient's Home Medications on Admission:   Current Outpatient Medications:  .  albuterol (PROVENTIL HFA;VENTOLIN HFA) 108 (90 BASE) MCG/ACT inhaler, Inhale 2 puffs into the lungs every 6 (six) hours as needed for wheezing or shortness of breath., Disp: 1 Inhaler, Rfl: 6 .  ALPRAZolam (XANAX) 0.25 MG tablet, Take 0.25 mg by mouth 2 (two) times daily as needed for anxiety or sleep. , Disp: , Rfl: 3 .  budesonide-formoterol (SYMBICORT) 160-4.5 MCG/ACT inhaler, Inhale 2 puffs into the lungs 2 (two) times daily., Disp: 1 Inhaler, Rfl: 6 .  cetirizine (ZYRTEC) 5 MG tablet, Take 5 mg by mouth daily., Disp: , Rfl:  .  D3-50 50000 units capsule, Take 50,000 Units by mouth once a week. Usually Tuesday or Wednesday., Disp: , Rfl: 0 .  erythromycin ophthalmic ointment, Place 1 application into both eyes daily as needed (Infection)., Disp: , Rfl:  .  hydroxypropyl methylcellulose / hypromellose (ISOPTO TEARS / GONIOVISC) 2.5 % ophthalmic solution, Place 1 drop into both eyes as needed for dry eyes., Disp: , Rfl:  .  levalbuterol (XOPENEX) 0.63 MG/3ML nebulizer solution, Take 3 mLs (0.63 mg total) by nebulization every 6 (six) hours as needed for wheezing or shortness of breath., Disp: 75 mL, Rfl: 6 .  losartan-hydrochlorothiazide (HYZAAR) 50-12.5 MG tablet, Take 0.5 tablets by mouth 2 (two) times daily. , Disp: , Rfl:  .  methocarbamol (ROBAXIN) 500 MG tablet,  Take 1 tablet (500 mg total) by mouth every 8 (eight) hours as needed for muscle spasms., Disp: 20 tablet, Rfl: 0 .  metoprolol succinate (TOPROL-XL) 50 MG 24 hr tablet, Take 1 tablet (50 mg total) by mouth 2 (two) times daily., Disp: 60 tablet, Rfl: 0 .  Multiple Vitamin (MULTIVITAMIN WITH MINERALS) TABS tablet, Take 1 tablet by mouth daily., Disp: , Rfl:  .  omeprazole (PRILOSEC) 40 MG capsule, Take 40 mg by mouth daily., Disp: , Rfl:  .  triamcinolone (NASACORT) 55 MCG/ACT AERO nasal inhaler, Place 1 spray into the nose daily as needed (Congestion). , Disp: , Rfl:  .  warfarin (COUMADIN) 2.5 MG tablet, Take 2.5 mg by mouth daily., Disp: , Rfl:   Past Medical History: Past Medical History:  Diagnosis Date  . Anxiety   . Aortic atherosclerosis ([place])   . Atrial fibrillation ([place])   . Basal cell carcinoma   . Bladder cancer ([place])   . Bladder tumor   . Colon polyps   . COPD (chronic obstructive pulmonary disease) ([place])   . Cutaneous vasculitis   . Diverticulosis   . Essential hypertension   . GERD (gastroesophageal reflux disease)   . History of pneumonia   . Nephrolithiasis   . Osteoarthritis     Tobacco Use: Social History   Tobacco Use  Smoking Status Former Smoker  . Packs/day: 1.00  . Years: 30.00  . Pack years: 30.00  . Types: Cigarettes  . Last attempt to quit: 07/07/2011  . Years since quitting: 6.7  Smokeless Tobacco Never Used    Labs: Recent Review Flowsheet Data    Labs for ITP Cardiac and Pulmonary Rehab Latest Ref Rng & Units 07/24/2015 07/09/2016 08/09/2017   PHART 7.350 - 7.450 - - 7.459(H)   PCO2ART 32.0 - 48.0 mmHg - - 42.8   HCO3 20.0 - 28.0 mmol/L - - 29.5(H)   TCO2 0 - 100 mmol/L 26 27 -   O2SAT % - - 95.7      Capillary Blood Glucose: No results found for: GLUCAP   Pulmonary Assessment Scores: Pulmonary Assessment Scores    Row Name 12/24/17 1410         ADL UCSD   ADL Phase  Entry     SOB Score total  21     Rest  0     Walk  4      Stairs  3     Bath  0     Dress  0     Shop  2       CAT Score   CAT Score  13       mMRC Score   mMRC Score  2        Pulmonary Function Assessment: Pulmonary Function Assessment - 12/24/17 1400      Pulmonary Function Tests   FVC%  1.79 %    FEV1%  4.54 %    FEV1/FVC Ratio  53    RV%  73 %      Initial Spirometry Results   FVC%  1.83 %    FEV1%  40 %    FEV1/FVC Ratio  55      Post Bronchodilator Spirometry Results   FVC%  1.83 %    FEV1%  40 %      Breath   Bilateral Breath Sounds  Clear    Shortness of Breath  Yes       Exercise Target Goals: Exercise Program Goal: Individual exercise prescription set using results from initial 6 min walk test and THRR while considering  patient's activity barriers and safety.   Exercise Prescription Goal: Initial exercise prescription builds to 30-45 minutes a day of aerobic activity, 2-3 days per week.  Home exercise guidelines will be given to patient during program as part of exercise prescription that the participant will acknowledge.  Activity Barriers & Risk Stratification: Activity Barriers & Cardiac Risk Stratification - 12/24/17 1417      Activity Barriers & Cardiac Risk Stratification   Activity Barriers  Shortness of Breath    Cardiac Risk Stratification  Low       6 Minute Walk: 6 Minute Walk    Row Name 12/24/17 1415         6 Minute Walk   Phase  Initial     Distance  1250 feet     Distance % Change  0 %     Distance Feet Change  0 ft     Walk Time  6 minutes     # of Rest Breaks  0     MPH  2.36     METS  2.81     RPE  13     Perceived Dyspnea   11     VO2 Peak  10.17     Symptoms  Yes (comment)     Comments  Patient states that he felt a "funny" feeling in his head during walk test. Feeling went away after rest     Resting HR  101 bpm     Resting BP  98/62     Resting Oxygen Saturation   92 %     Exercise Oxygen Saturation  during 6 min walk  87 %     Max Ex. HR  112 bpm     Max Ex. BP   118/64     2 Minute Post BP  104/60        Oxygen Initial Assessment: Oxygen Initial Assessment - 12/24/17 1412      Home Oxygen   Home Oxygen Device  None    Sleep Oxygen Prescription  None    Home Exercise Oxygen Prescription  None    Home at Rest Exercise Oxygen Prescription  None    Compliance with Home Oxygen Use  --   N/A     Initial 6 min Walk   Oxygen Used  None      Program Oxygen Prescription   Program Oxygen Prescription  None       Oxygen Re-Evaluation: Oxygen Re-Evaluation    Row Name 02/14/18 1251 03/07/18 0752 03/28/18 0747 04/18/18 0932       Program Oxygen Prescription   Program Oxygen Prescription  None  None  None  None      Home Oxygen   Home Oxygen Device  None  None  None  None    Sleep Oxygen Prescription  None  -  None  None    Home Exercise Oxygen Prescription  None  None  None  None    Home at Rest Exercise Oxygen Prescription  None  None  None  None      Goals/Expected Outcomes   Short Term Goals  To learn and understand importance of monitoring SPO2 with pulse oximeter and demonstrate accurate use of the pulse oximeter.;To learn and understand importance of maintaining oxygen saturations>88%;To learn and demonstrate proper pursed lip breathing techniques or other breathing techniques.;To learn and exhibit compliance with exercise, home and travel O2 prescription  To learn and understand importance of monitoring SPO2 with pulse oximeter and demonstrate accurate use of the pulse oximeter.;To learn and understand importance of maintaining oxygen saturations>88%;To learn and demonstrate proper pursed lip breathing techniques or other breathing techniques.;To learn and exhibit compliance with exercise, home and travel O2 prescription  To learn and understand importance of monitoring SPO2 with pulse oximeter and demonstrate accurate use of the pulse oximeter.;To learn and understand importance of maintaining oxygen saturations>88%;To learn and  demonstrate proper pursed lip breathing techniques or other breathing techniques.;To learn and exhibit compliance with exercise, home and travel O2 prescription  To learn and understand importance of monitoring SPO2 with pulse oximeter and demonstrate accurate use of the pulse oximeter.;To learn and understand importance of maintaining oxygen saturations>88%;To learn and demonstrate proper pursed lip breathing techniques or other breathing techniques.;To learn and exhibit compliance with exercise, home and travel O2 prescription    Long  Term Goals  Exhibits compliance with exercise, home and travel O2 prescription;Verbalizes importance of monitoring SPO2 with pulse oximeter and return demonstration;Maintenance of O2 saturations>88%;Exhibits proper breathing techniques, such as pursed lip breathing or other method taught during program session;Compliance with respiratory medication  Exhibits compliance with exercise, home and travel O2 prescription;Verbalizes importance of monitoring SPO2 with pulse oximeter and return demonstration;Maintenance of O2 saturations>88%;Exhibits proper breathing techniques, such as pursed lip breathing or other method taught during program session;Compliance with respiratory medication  Exhibits compliance with exercise, home and travel O2 prescription;Verbalizes importance of monitoring SPO2 with pulse oximeter  and return demonstration;Maintenance of O2 saturations>88%;Exhibits proper breathing techniques, such as pursed lip breathing or other method taught during program session;Compliance with respiratory medication  Exhibits compliance with exercise, home and travel O2 prescription;Verbalizes importance of monitoring SPO2 with pulse oximeter and return demonstration;Maintenance of O2 saturations>88%;Exhibits proper breathing techniques, such as pursed lip breathing or other method taught during program session;Compliance with respiratory medication    Comments  Patient  verbalizes importance of monitoring O2 and maintaining >88% and is able to demonstrate proper usage of pulse oximeter. He also is able to demonstrate proper pursed lip breathing technique during exercise. Will conitnue to monitor.   Patient verbalizes importance of monitoring O2 and maintaining >88% and is able to demonstrate proper usage of pulse oximeter. He also is able to demonstrate proper pursed lip breathing technique during exercise. Will conitnue to monitor.   Patient verbalizes importance of monitoring O2 and maintaining >88% and is able to demonstrate proper usage of pulse oximeter. He also is able to demonstrate proper pursed lip breathing technique during exercise. Will conitnue to monitor.   Patient verbalizes importance of monitoring O2 and maintaining >88% and is able to demonstrate proper usage of pulse oximeter. He also is able to demonstrate proper pursed lip breathing technique during exercise. Will conitnue to monitor.     Goals/Expected Outcomes  Patient will continue to meet his short and long term goals.   Patient will continue to meet his short and long term goals.   Patient will continue to meet his short and long term goals.   Patient will continue to meet his short and long term goals.        Oxygen Discharge (Final Oxygen Re-Evaluation): Oxygen Re-Evaluation - 04/18/18 0932      Program Oxygen Prescription   Program Oxygen Prescription  None      Home Oxygen   Home Oxygen Device  None    Sleep Oxygen Prescription  None    Home Exercise Oxygen Prescription  None    Home at Rest Exercise Oxygen Prescription  None      Goals/Expected Outcomes   Short Term Goals  To learn and understand importance of monitoring SPO2 with pulse oximeter and demonstrate accurate use of the pulse oximeter.;To learn and understand importance of maintaining oxygen saturations>88%;To learn and demonstrate proper pursed lip breathing techniques or other breathing techniques.;To learn and exhibit  compliance with exercise, home and travel O2 prescription    Long  Term Goals  Exhibits compliance with exercise, home and travel O2 prescription;Verbalizes importance of monitoring SPO2 with pulse oximeter and return demonstration;Maintenance of O2 saturations>88%;Exhibits proper breathing techniques, such as pursed lip breathing or other method taught during program session;Compliance with respiratory medication    Comments  Patient verbalizes importance of monitoring O2 and maintaining >88% and is able to demonstrate proper usage of pulse oximeter. He also is able to demonstrate proper pursed lip breathing technique during exercise. Will conitnue to monitor.     Goals/Expected Outcomes  Patient will continue to meet his short and long term goals.        Initial Exercise Prescription: Initial Exercise Prescription - 01/08/18 0700      Date of Initial Exercise RX and Referring Provider   Date  12/24/17  (Pended)     Referring Provider  Dr. Elsworth Soho  (Pended)       Treadmill   MPH  1.3    Grade  0    Minutes  15    METs  1.9  NuStep   Level  1    Minutes  20      Prescription Details   Frequency (times per week)  2  (Pended)       Intensity   THRR 40-80% of Max Heartrate  [phone]  (Pended)     Ratings of Perceived Exertion  11-13  (Pended)     Perceived Dyspnea  0-4  (Pended)       Progression   Progression  Continue progressive overload as per policy without signs/symptoms or physical distress.  (Pended)       Resistance Training   Training Prescription  Yes    Weight  1    Reps  10-15       Perform Capillary Blood Glucose checks as needed.  Exercise Prescription Changes:  Exercise Prescription Changes    Row Name 01/08/18 0700 01/27/18 1200 02/13/18 1800 03/04/18 0700 03/15/18 1400     Response to Exercise   Blood Pressure (Admit)  88/50  110/76  120/64  118/60  130/64   Blood Pressure (Exercise)  98/50  138/68  136/80  140/70  142/62   Blood Pressure (Exit)   90/56  118/62  140/62  108/60  120/56   Heart Rate (Admit)  87 bpm  82 bpm  74 bpm  83 bpm  58 bpm   Heart Rate (Exercise)  89 bpm  82 bpm  93 bpm  89 bpm  88 bpm   Heart Rate (Exit)  84 bpm  87 bpm  76 bpm  74 bpm  68 bpm   Oxygen Saturation (Admit)  92 %  94 %  99 %  94 %  97 %   Oxygen Saturation (Exercise)  92 %  82 %  96 %  93 %  93 %   Oxygen Saturation (Exit)  94 %  92 %  95 %  94 %  98 %   Rating of Perceived Exertion (Exercise)  9  11  12  12  12    Perceived Dyspnea (Exercise)  9  11  12  12  12    Duration  Progress to 30 minutes of  aerobic without signs/symptoms of physical distress  Progress to 30 minutes of  aerobic without signs/symptoms of physical distress  Progress to 30 minutes of  aerobic without signs/symptoms of physical distress  Progress to 30 minutes of  aerobic without signs/symptoms of physical distress  Progress to 30 minutes of  aerobic without signs/symptoms of physical distress   Intensity  THRR New [id number]  THRR New 108-121-134  THRR New 103-118-132  THRR New 109-121-134  THRR unchanged     Progression   Progression  Continue to progress workloads to maintain intensity without signs/symptoms of physical distress.  Continue to progress workloads to maintain intensity without signs/symptoms of physical distress.  Continue to progress workloads to maintain intensity without signs/symptoms of physical distress.  Continue to progress workloads to maintain intensity without signs/symptoms of physical distress.  Continue to progress workloads to maintain intensity without signs/symptoms of physical distress.   Average METs  -  -  2.13  1.96  2.05     Resistance Training   Training Prescription  -  Yes  Yes  Yes  Yes   Weight  -  1  2  2  2    Reps  -  10-15  10-15  10-15  10-15   Time  -  5 Minutes  5 Minutes  5 Minutes  5 Minutes  Treadmill   MPH  -  1.3  1.5  1.6  1.7   Grade  -  0  0  0  0   Minutes  -  15  15  17  17    METs  -  1.99  2.14  2.22  2.3      NuStep   Level  -  0  2  2  3    SPM  79  80  87  78  75   Minutes  -  20  20  22  22    METs  1.9  1.6  2.12  1.7  1.8     Home Exercise Plan   Plans to continue exercise at  -  Home (comment)  Home (comment)  Home (comment)  Home (comment)   Frequency  -  Add 3 additional days to program exercise sessions.  Add 3 additional days to program exercise sessions.  Add 3 additional days to program exercise sessions.  Add 3 additional days to program exercise sessions.   Initial Home Exercises Provided  -  01/01/18  01/01/18  01/01/18  01/01/18   Row Name 04/10/18 1400             Response to Exercise   Blood Pressure (Admit)  124/66       Blood Pressure (Exercise)  136/68       Blood Pressure (Exit)  118/68       Heart Rate (Admit)  76 bpm       Heart Rate (Exercise)  83 bpm       Heart Rate (Exit)  81 bpm       Oxygen Saturation (Admit)  96 %       Oxygen Saturation (Exercise)  92 %       Oxygen Saturation (Exit)  96 %       Rating of Perceived Exertion (Exercise)  12       Perceived Dyspnea (Exercise)  12       Duration  Progress to 30 minutes of  aerobic without signs/symptoms of physical distress       Intensity  THRR unchanged         Progression   Progression  Continue to progress workloads to maintain intensity without signs/symptoms of physical distress.       Average METs  2.05         Resistance Training   Training Prescription  Yes       Weight  3       Reps  10-15       Time  5 Minutes         Treadmill   MPH  1.8       Grade  0       Minutes  17       METs  2.37         NuStep   Level  3       SPM  95       Minutes  22       METs  1.7         Home Exercise Plan   Plans to continue exercise at  Home (comment)       Frequency  Add 3 additional days to program exercise sessions.       Initial Home Exercises Provided  01/01/18          Exercise Comments:  Exercise Comments    Row  Name 01/08/18 0755 02/13/18 1844 03/04/18 0752 03/15/18 1421 03/26/18  0825   Exercise Comments  Patient has just started PR. Patient will be progressed in time.   Patient feels much better, getting stronger and breathing better. Enjoys attending the program.   Patient feels much better, getting stronger and breathing better. Enjoys attending the program.   Patient is progressing well in the program, he feels as if it is helping to improve his breathing and build strength.   Patient is progressing well in the program, he feels as if it is helping to improve his breathing and build strength.    [place] Name 04/16/18 1021           Exercise Comments  Patient reports that he is beginning to feel better overall and that the program is really helping him to feel better overall and improve his SOB with activity.           Exercise Goals and Review:  Exercise Goals    Row Name 12/24/17 1418             Exercise Goals   Increase Physical Activity  Yes       Intervention  Provide advice, education, support and counseling about physical activity/exercise needs.;Develop an individualized exercise prescription for aerobic and resistive training based on initial evaluation findings, risk stratification, comorbidities and participant's personal goals.       Expected Outcomes  Short Term: Attend rehab on a regular basis to increase amount of physical activity.       Increase Strength and Stamina  Yes       Intervention  Provide advice, education, support and counseling about physical activity/exercise needs.;Develop an individualized exercise prescription for aerobic and resistive training based on initial evaluation findings, risk stratification, comorbidities and participant's personal goals.       Expected Outcomes  Short Term: Increase workloads from initial exercise prescription for resistance, speed, and METs.       Able to understand and use rate of perceived exertion (RPE) scale  Yes       Intervention  Provide education and explanation on how to use RPE scale        Expected Outcomes  Long Term:  Able to use RPE to guide intensity level when exercising independently;Short Term: Able to use RPE daily in rehab to express subjective intensity level       Able to understand and use Dyspnea scale  Yes       Intervention  Provide education and explanation on how to use Dyspnea scale       Expected Outcomes  Short Term: Able to use Dyspnea scale daily in rehab to express subjective sense of shortness of breath during exertion;Long Term: Able to use Dyspnea scale to guide intensity level when exercising independently       Knowledge and understanding of Target Heart Rate Range (THRR)  Yes       Intervention  Provide education and explanation of THRR including how the numbers were predicted and where they are located for reference       Expected Outcomes  Short Term: Able to state/look up THRR;Long Term: Able to use THRR to govern intensity when exercising independently;Short Term: Able to use daily as guideline for intensity in rehab       Able to check pulse independently  Yes       Intervention  Provide education and demonstration on how to check pulse in carotid and radial arteries.;Review the importance  of being able to check your own pulse for safety during independent exercise       Expected Outcomes  Short Term: Able to explain why pulse checking is important during independent exercise;Long Term: Able to check pulse independently and accurately       Understanding of Exercise Prescription  Yes       Intervention  Provide education, explanation, and written materials on patient's individual exercise prescription       Expected Outcomes  Short Term: Able to explain program exercise prescription;Long Term: Able to explain home exercise prescription to exercise independently          Exercise Goals Re-Evaluation : Exercise Goals Re-Evaluation    Row Name 01/14/18 1437 02/13/18 1840 03/04/18 0747 03/15/18 1417 03/26/18 0823     Exercise Goal Re-Evaluation    Exercise Goals Review  Increase Physical Activity;Increase Strength and Stamina;Able to understand and use rate of perceived exertion (RPE) scale;Improve claudication pain tolerance and improve walking ability;Knowledge and understanding of Target Heart Rate Range (THRR);Able to understand and use Dyspnea scale;Understanding of Exercise Prescription  Increase Strength and Stamina;Able to understand and use Dyspnea scale;Knowledge and understanding of Target Heart Rate Range (THRR);Able to understand and use rate of perceived exertion (RPE) scale;Understanding of Exercise Prescription  Increase Strength and Stamina;Able to understand and use rate of perceived exertion (RPE) scale;Able to understand and use Dyspnea scale;Understanding of Exercise Prescription  Increase Strength and Stamina;Able to understand and use rate of perceived exertion (RPE) scale;Able to understand and use Dyspnea scale;Understanding of Exercise Prescription  Increase Strength and Stamina;Able to understand and use rate of perceived exertion (RPE) scale;Able to understand and use Dyspnea scale;Understanding of Exercise Prescription   Comments  Patient has only been to PR for 3 sessions so far. Patient will be progressed in time.   Patient has been able to tolerate increases with wieghts and equipment workloads. He feels he is getting stronger. He also feels he is breathing better. 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 this has not affected his ability to exercise.   Patient has been able to tolerate increases with weight and equipment workloads. he is getting stronger. We have a hard to getting his SaO2 when on TM. Overall he is doing well.   Patient is doing well in rehab. He has had 18 sessions so far and is tolerating progressions. He has increased his workload on the Nu Step to level 3.   Patient is doing well in rehab. He has had 18 sessions so far and is tolerating progressions. He has increased his workload  on the Nu Step to level 3. Increased weights to 3 lbs.    Expected Outcomes  Patient wishes to increase strength and to be able to breathe better   To cintinue to build strength and his breathing to continue to improve.   To cintinue to build strength and his breathing to continue to improve.   Continue to build strength and improve his breathing.   Continue to build strength and improve his breathing.    [place] Name 04/16/18 1013             Exercise Goal Re-Evaluation   Exercise Goals Review  Increase Strength and Stamina;Able to understand and use rate of perceived exertion (RPE) scale;Able to understand and use Dyspnea scale;Understanding of Exercise Prescription       Comments  Patient is continuing to progress well in the program. He is up to 1.8  mph on the treadmill. Will progress to 4lb weights if able.        Expected Outcomes  Build strength and continue to improve SOB.           Discharge Exercise Prescription (Final Exercise Prescription Changes): Exercise Prescription Changes - 04/10/18 1400      Response to Exercise   Blood Pressure (Admit)  124/66    Blood Pressure (Exercise)  136/68    Blood Pressure (Exit)  118/68    Heart Rate (Admit)  76 bpm    Heart Rate (Exercise)  83 bpm    Heart Rate (Exit)  81 bpm    Oxygen Saturation (Admit)  96 %    Oxygen Saturation (Exercise)  92 %    Oxygen Saturation (Exit)  96 %    Rating of Perceived Exertion (Exercise)  12    Perceived Dyspnea (Exercise)  12    Duration  Progress to 30 minutes of  aerobic without signs/symptoms of physical distress    Intensity  THRR unchanged      Progression   Progression  Continue to progress workloads to maintain intensity without signs/symptoms of physical distress.    Average METs  2.05      Resistance Training   Training Prescription  Yes    Weight  3    Reps  10-15    Time  5 Minutes      Treadmill   MPH  1.8    Grade  0    Minutes  17    METs  2.37      NuStep   Level  3    SPM   95    Minutes  22    METs  1.7      Home Exercise Plan   Plans to continue exercise at  Home (comment)    Frequency  Add 3 additional days to program exercise sessions.    Initial Home Exercises Provided  01/01/18       Nutrition:  Target Goals: Understanding of nutrition guidelines, daily intake of sodium 1500mg , cholesterol 200mg , calories 30% from fat and 7% or less from saturated fats, daily to have 5 or more servings of fruits and vegetables.  Biometrics: Pre Biometrics - 12/24/17 1419      Pre Biometrics   Height  5\' 11"  (1.803 m)    Weight  80.4 kg    Waist Circumference  40 inches    Hip Circumference  37 inches    Waist to Hip Ratio  1.08 %    BMI (Calculated)  24.73    Triceps Skinfold  16 mm    % Body Fat  26.7 %    Grip Strength  79.33 kg    Flexibility  0 in    Single Leg Stand  3 seconds        Nutrition Therapy Plan and Nutrition Goals: Nutrition Therapy & Goals - 04/18/18 0932      Nutrition Therapy   RD appointment deferred  Yes      Personal Nutrition Goals   Nutrition Goal  RD appointment deferred. Patient not trying to change his diet.     Additional Goals?  No       Nutrition Assessments: Nutrition Assessments - 12/24/17 1443      MEDFICTS Scores   Pre Score  48       Nutrition Goals Re-Evaluation:   Nutrition Goals Discharge (Final Nutrition Goals Re-Evaluation):   Psychosocial: Target Goals: Acknowledge presence  or absence of significant depression and/or stress, maximize coping skills, provide positive support system. Participant is able to verbalize types and ability to use techniques and skills needed for reducing stress and depression.  Initial Review & Psychosocial Screening: Initial Psych Review & Screening - 12/24/17 1445      Initial Review   Current issues with  None Identified      Family Dynamics   Good Support System?  Yes      Barriers   Psychosocial barriers to participate in program  There are no  identifiable barriers or psychosocial needs.      Screening Interventions   Interventions  Encouraged to exercise    Expected Outcomes  Short Term goal: Identification and review with participant of any Quality of Life or Depression concerns found by scoring the questionnaire.;Long Term goal: The participant improves quality of Life and PHQ9 Scores as seen by post scores and/or verbalization of changes       Quality of Life Scores: Quality of Life - 12/24/17 1419      Quality of Life Scores   Health/Function Pre  16.16 %    Socioeconomic Pre  19.67 %    Psych/Spiritual Pre  24.64 %    Family Pre  14.5 %    GLOBAL Pre  18.28 %      Scores of 19 and below usually indicate a poorer quality of life in these areas.  A difference of  2-3 points is a clinically meaningful difference.  A difference of 2-3 points in the total score of the Quality of Life Index has been associated with significant improvement in overall quality of life, self-image, physical symptoms, and general health in studies assessing change in quality of life.   PHQ-9: Recent Review Flowsheet Data    Depression screen [facility] 2/9 12/24/2017 05/21/2017 03/06/2017 02/20/2017 08/06/2014   Decreased Interest 0 0 0 0 0   Down, Depressed, Hopeless 0 0 0 0 0   PHQ - 2 Score 0 0 0 0 0   Altered sleeping 1 - - 1  -   Tired, decreased energy 2 - - 1  -   Change in appetite 2 - - 1  -   Feeling bad or failure about yourself  0 - - 0 -   Trouble concentrating 0 - - 0 -   Moving slowly or fidgety/restless 0 - - 0 -   Suicidal thoughts 0 - - 0 -   PHQ-9 Score 5 - - 3 -   Difficult doing work/chores Somewhat difficult - - Somewhat difficult -     Interpretation of Total Score  Total Score Depression Severity:  1-4 = Minimal depression, 5-9 = Mild depression, 10-14 = Moderate depression, 15-19 = Moderately severe depression, 20-27 = Severe depression   Psychosocial Evaluation and Intervention: Psychosocial Evaluation - 12/24/17 1445       Psychosocial Evaluation & Interventions   Interventions  Encouraged to exercise with the program and follow exercise prescription    Continue Psychosocial Services   No Follow up required       Psychosocial Re-Evaluation: Psychosocial Re-Evaluation    Row Name 02/14/18 1256 03/07/18 0752 03/28/18 0751 04/18/18 0934       Psychosocial Re-Evaluation   Current issues with  None Identified  None Identified  None Identified  None Identified    Comments  Patient's initial QOL score was 18.28 and his PHQ-9 score was 5. He says he is not depressed. No psychosocial issues identified.  Patient's initial QOL score was 18.28 and his PHQ-9 score was 5. He says he is not depressed. No psychosocial issues identified.   Patient's initial QOL score was 18.28 and his PHQ-9 score was 5. No psychosocial issues identified.   Patient's initial QOL score was 18.28 and his PHQ-9 score was 5. No psychosocial issues identified.     Expected Outcomes  Patient will not have any psychosocial issues identified at discharge and his QOL and PHQ-9 scores will improve at discharge.   Patient will not have any psychosocial issues identified at discharge and his QOL and PHQ-9 scores will improve at discharge.   Patient will not have any psychosocial issues identified at discharge and his QOL and PHQ-9 scores will improve at discharge.   Patient will not have any psychosocial issues identified at discharge and his QOL and PHQ-9 scores will improve at discharge.     Interventions  Stress management education;Relaxation education;Encouraged to attend Pulmonary Rehabilitation for the exercise  Stress management education;Relaxation education;Encouraged to attend Pulmonary Rehabilitation for the exercise  Stress management education;Relaxation education;Encouraged to attend Pulmonary Rehabilitation for the exercise  Stress management education;Relaxation education;Encouraged to attend Pulmonary Rehabilitation for the exercise     Continue Psychosocial Services   No Follow up required  No Follow up required  No Follow up required  No Follow up required       Psychosocial Discharge (Final Psychosocial Re-Evaluation): Psychosocial Re-Evaluation - 04/18/18 0934      Psychosocial Re-Evaluation   Current issues with  None Identified    Comments  Patient's initial QOL score was 18.28 and his PHQ-9 score was 5. No psychosocial issues identified.     Expected Outcomes  Patient will not have any psychosocial issues identified at discharge and his QOL and PHQ-9 scores will improve at discharge.     Interventions  Stress management education;Relaxation education;Encouraged to attend Pulmonary Rehabilitation for the exercise    Continue Psychosocial Services   No Follow up required        Education: Education Goals: Education classes will be provided on a weekly basis, covering required topics. Participant will state understanding/return demonstration of topics presented.  Learning Barriers/Preferences: Learning Barriers/Preferences - 12/24/17 1341      Learning Barriers/Preferences   Learning Barriers  None    Learning Preferences  Verbal Instruction;Skilled Demonstration;Group Instruction       Education Topics: How Lungs Work and Diseases: - Discuss the anatomy of the lungs and diseases that can affect the lungs, such as COPD.   PULMONARY REHAB CHRONIC OBSTRUCTIVE PULMONARY DISEASE from 03/28/2018 in [place]  Date  02/14/18  Educator  DC  Instruction Review Code  2- Demonstrated Understanding      Exercise: -Discuss the importance of exercise, FITT principles of exercise, normal and abnormal responses to exercise, and how to exercise safely.   Environmental Irritants: -Discuss types of environmental irritants and how to limit exposure to environmental irritants.   Meds/Inhalers and oxygen: - Discuss respiratory medications, definition of an inhaler and oxygen, and the proper way  to use an inhaler and oxygen.   PULMONARY REHAB CHRONIC OBSTRUCTIVE PULMONARY DISEASE from 03/28/2018 in [place]  Date  02/28/18  Educator  D. Wynetta Emery      Energy Saving Techniques: - Discuss methods to conserve energy and decrease shortness of breath when performing activities of daily living.    PULMONARY REHAB CHRONIC OBSTRUCTIVE PULMONARY DISEASE from 03/28/2018 in [place]  Date  03/07/18  Educator  D. Coad  Instruction Review Code  2- Demonstrated Understanding      Bronchial Hygiene / Breathing Techniques: - Discuss breathing mechanics, pursed-lip breathing technique,  proper posture, effective ways to clear airways, and other functional breathing techniques   PULMONARY REHAB CHRONIC OBSTRUCTIVE PULMONARY DISEASE from 03/28/2018 in [place]  Date  03/14/18  Educator  D. Coad  Instruction Review Code  2- Demonstrated Understanding      Cleaning Equipment: - Provides group verbal and written instruction about the health risks of elevated stress, cause of high stress, and healthy ways to reduce stress.   PULMONARY REHAB CHRONIC OBSTRUCTIVE PULMONARY DISEASE from 03/28/2018 in [place]  Date  03/21/18  Educator  D. Coad  Instruction Review Code  2- Demonstrated Understanding      Nutrition I: Fats: - Discuss the types of cholesterol, what cholesterol does to the body, and how cholesterol levels can be controlled.   PULMONARY REHAB CHRONIC OBSTRUCTIVE PULMONARY DISEASE from 03/28/2018 in [place]  Date  03/28/18  Educator  D. Coad  Instruction Review Code  2- Demonstrated Understanding      Nutrition II: Labels: -Discuss the different components of food labels and how to read food labels.   PULMONARY REHAB CHRONIC OBSTRUCTIVE PULMONARY DISEASE from 03/28/2018 in [place]  Date  01/03/18  Educator  D. Coad  Instruction Review  Code  2- Demonstrated Understanding      Respiratory Infections: - Discuss the signs and symptoms of respiratory infections, ways to prevent respiratory infections, and the importance of seeking medical treatment when having a respiratory infection.   Stress I: Signs and Symptoms: - Discuss the causes of stress, how stress may lead to anxiety and depression, and ways to limit stress.   Stress II: Relaxation: -Discuss relaxation techniques to limit stress.   PULMONARY REHAB CHRONIC OBSTRUCTIVE PULMONARY DISEASE from 03/28/2018 in [place]  Date  01/24/18  Educator  D. Coad  Instruction Review Code  2- Demonstrated Understanding      Oxygen for Home/Travel: - Discuss how to prepare for travel when on oxygen and proper ways to transport and store oxygen to ensure safety.   PULMONARY REHAB CHRONIC OBSTRUCTIVE PULMONARY DISEASE from 03/28/2018 in [place]  Date  01/31/18  Educator  D. Coad  Instruction Review Code  2- Demonstrated Understanding      Knowledge Questionnaire Score: Knowledge Questionnaire Score - 12/24/17 1342      Knowledge Questionnaire Score   Pre Score  16/18       Core Components/Risk Factors/Patient Goals at Admission: Personal Goals and Risk Factors at Admission - 12/24/17 1444      Core Components/Risk Factors/Patient Goals on Admission    Weight Management  Weight Maintenance    Personal Goal Other  Yes    Personal Goal  Build strength, breathe better    Intervention  Attend PR 2 x week and supplement at home exercise 3 x week.     Expected Outcomes  Achieve personal goals.        Core Components/Risk Factors/Patient Goals Review:  Goals and Risk Factor Review    Row Name 02/14/18 1253 03/07/18 0747 03/28/18 0748 04/18/18 0932       Core Components/Risk Factors/Patient Goals Review   Personal Goals Review  Weight Management/Obesity;Develop more efficient breathing techniques such as purse lipped  breathing and diaphragmatic breathing and practicing self-pacing with activity.;Improve shortness of breath with  ADL's Build strength; breathe better; improve lung capacity.  Weight Management/Obesity;Develop more efficient breathing techniques such as purse lipped breathing and diaphragmatic breathing and practicing self-pacing with activity.;Improve shortness of breath with ADL's Build strength; breathe better; improve lung capacity.   Weight Management/Obesity;Develop more efficient breathing techniques such as purse lipped breathing and diaphragmatic breathing and practicing self-pacing with activity.;Improve shortness of breath with ADL's Build strength; breathe better; improve lung capacity.  Weight Management/Obesity;Develop more efficient breathing techniques such as purse lipped breathing and diaphragmatic breathing and practicing self-pacing with activity.;Improve shortness of breath with ADL's Build strength; breathe better; improve lung capacity.     Review  Patient has completed 12 sessions gaining 3 lbs since he started the program. He is doing well in the program with progression. He says he does feel a little stronger and is breathing a little better. He hopes to improve more as he continues the program but is pleased with his progress so far. Will continue to monitor for progress.   Patient has completed 15 sessions losing 1 lb since last 30 day review. He continues to do well in the program with progression. He says she program is helping him have less SOB. He continues to feel stronger and better overall. He does demonstrate proper pursed lip breathing in the sessions if needed. Will continue to monitor for progress.   Patient has completed 19 sessions losing 2 lbs since last 30 day review. He continues to do well in the program with progression. He continues to say the program is helping him. He feel stronger and is able to work outside more with less SOB. He is able to use a chainsaw. He says  he feels stronger and better overall. Will continue to monitor for progress.   Patient has completed 24 sessions losing 3 lbs since last 30 day review. He continues to do well in the program with progression. He continues to say he feels better overall since starting the program and has more energy and strength to do this around the house. Will continue to monitor for progress.     Expected Outcomes  Patient will continue to attend sessions and complete the program meeting his personal goals.   Patient will continue to attend sessions and complete the program meeting his personal goals.   Patient will continue to attend sessions and complete the program meeting his personal goals.   Patient will continue to attend sessions and complete the program meeting his personal goals.        Core Components/Risk Factors/Patient Goals at Discharge (Final Review):  Goals and Risk Factor Review - 04/18/18 0932      Core Components/Risk Factors/Patient Goals Review   Personal Goals Review  Weight Management/Obesity;Develop more efficient breathing techniques such as purse lipped breathing and diaphragmatic breathing and practicing self-pacing with activity.;Improve shortness of breath with ADL's   Build strength; breathe better; improve lung capacity.    Review  Patient has completed 24 sessions losing 3 lbs since last 30 day review. He continues to do well in the program with progression. He continues to say he feels better overall since starting the program and has more energy and strength to do this around the house. Will continue to monitor for progress.     Expected Outcomes  Patient will continue to attend sessions and complete the program meeting his personal goals.        ITP Comments: ITP Comments    Row Name 12/24/17 1355 01/16/18 1405  ITP Comments  Mr. Brinkmeier has been in the program before.   Patient new to program. He has completed 4 sessions. Will continue to monitor for progress.            Comments: ITP REVIEW Pt is making expected progress toward pulmonary rehab goals after completing 24 sessions. Recommend continued exercise, life style modification, education, and utilization of breathing techniques to increase stamina and strength and decrease shortness of breath with exertion.

## 2018-04-23 ENCOUNTER — Encounter (HOSPITAL_COMMUNITY)
Admission: RE | Admit: 2018-04-23 | Discharge: 2018-04-23 | Disposition: A | Discharge status: Home / Self Care | Payer: Medicare Other | Source: Ambulatory Visit | Attending: Pulmonary Disease | Admitting: Pulmonary Disease

## 2018-04-23 DIAGNOSIS — J449 Chronic obstructive pulmonary disease, unspecified: Secondary | ICD-10-CM

## 2018-04-23 DIAGNOSIS — J439 Emphysema, unspecified: Secondary | ICD-10-CM | POA: Diagnosis not present

## 2018-04-23 NOTE — Progress Notes (Signed)
Daily Session Note  Patient Details  Name: Matthew Hall MRN: [id number] Date of Birth: [birth year] Referring Provider:     PULMONARY REHAB CHRONIC OBSTRUCTIVE PULMONARY DISEASE from 01/03/2018 in [place]  Referring Provider  Dr. Elsworth Soho  [facility])       Encounter Date: 04/23/2018  Check In: Session Check In - 04/23/18 1126      Check-In   Supervising physician immediately available to respond to emergencies  See telemetry face sheet for immediately available MD    Location  [facility]    Staff Present  Russella Dar, MS, EP, [facility], Exercise Physiologist;Dusty Raczkowski Zachery Conch, Exercise Physiologist    Medication changes reported      No    Fall or balance concerns reported     No    Tobacco Cessation  No Change    Warm-up and Cool-down  Performed as group-led instruction    Resistance Training Performed  Yes    VAD Patient?  No    PAD/SET Patient?  No      Pain Assessment   Currently in Pain?  No/denies    Pain Score  0-No pain    Multiple Pain Sites  No       Capillary Blood Glucose: No results found for this or any previous visit (from the past 24 hour(s)).    Social History   Tobacco Use  Smoking Status Former Smoker  . Packs/day: 1.00  . Years: 30.00  . Pack years: 30.00  . Types: Cigarettes  . Last attempt to quit: 07/07/2011  . Years since quitting: 6.8  Smokeless Tobacco Never Used    Goals Met:  Proper associated with RPD/PD & O2 Sat Independence with exercise equipment Using PLB without cueing & demonstrates good technique Exercise tolerated well No report of cardiac concerns or symptoms Strength training completed today  Goals Unmet:  Not Applicable  Comments: Pt able to follow exercise prescription today without complaint.  Will continue to monitor for progression. Check out 11:45.   Dr. Sinda Du is Medical Director for [facility] Pulmonary Rehab.

## 2018-04-25 ENCOUNTER — Encounter (HOSPITAL_COMMUNITY)
Admission: RE | Admit: 2018-04-25 | Discharge: 2018-04-25 | Disposition: A | Discharge status: Home / Self Care | Payer: Medicare Other | Source: Ambulatory Visit | Attending: Pulmonary Disease | Admitting: Pulmonary Disease

## 2018-04-25 DIAGNOSIS — J449 Chronic obstructive pulmonary disease, unspecified: Secondary | ICD-10-CM

## 2018-04-25 DIAGNOSIS — J439 Emphysema, unspecified: Secondary | ICD-10-CM | POA: Diagnosis not present

## 2018-04-25 NOTE — Progress Notes (Signed)
Daily Session Note  Patient Details  Name: WIL SLAPE MRN: [id number] Date of Birth: [birth year] Referring Provider:     PULMONARY REHAB CHRONIC OBSTRUCTIVE PULMONARY DISEASE from 01/03/2018 in [place]  Referring Provider  Dr. Elsworth Soho  [facility])       Encounter Date: 04/25/2018  Check In: Session Check In - 04/25/18 1045      Check-In   Supervising physician immediately available to respond to emergencies  See telemetry face sheet for immediately available MD    Location  [facility]    Staff Present  Russella Dar, MS, EP, [facility], Exercise Physiologist;Barton Want Zachery Conch, Exercise Physiologist    Medication changes reported      No    Fall or balance concerns reported     No    Tobacco Cessation  No Change    Warm-up and Cool-down  Performed as group-led instruction    Resistance Training Performed  Yes    VAD Patient?  No    PAD/SET Patient?  No      Pain Assessment   Currently in Pain?  No/denies    Pain Score  0-No pain    Multiple Pain Sites  No       Capillary Blood Glucose: No results found for this or any previous visit (from the past 24 hour(s)).    Social History   Tobacco Use  Smoking Status Former Smoker  . Packs/day: 1.00  . Years: 30.00  . Pack years: 30.00  . Types: Cigarettes  . Last attempt to quit: 07/07/2011  . Years since quitting: 6.8  Smokeless Tobacco Never Used    Goals Met:  Proper associated with RPD/PD & O2 Sat Independence with exercise equipment Using PLB without cueing & demonstrates good technique Exercise tolerated well No report of cardiac concerns or symptoms Strength training completed today  Goals Unmet:  Not Applicable  Comments: Pt able to follow exercise prescription today without complaint.  Will continue to monitor for progression. Check out 11:45.   Dr. Sinda Du is Medical Director for [facility] Pulmonary Rehab.

## 2018-04-30 ENCOUNTER — Encounter (HOSPITAL_COMMUNITY): Payer: Medicare Other

## 2018-05-02 ENCOUNTER — Encounter (HOSPITAL_COMMUNITY)
Admission: RE | Admit: 2018-05-02 | Discharge: 2018-05-02 | Disposition: A | Discharge status: Home / Self Care | Payer: Medicare Other | Source: Ambulatory Visit | Attending: Pulmonary Disease | Admitting: Pulmonary Disease

## 2018-05-02 DIAGNOSIS — J439 Emphysema, unspecified: Secondary | ICD-10-CM | POA: Diagnosis not present

## 2018-05-02 DIAGNOSIS — J449 Chronic obstructive pulmonary disease, unspecified: Secondary | ICD-10-CM

## 2018-05-02 NOTE — Progress Notes (Signed)
Daily Session Note  Patient Details  Name: Matthew Hall MRN: [id number] Date of Birth: Jul 13, 1945 Referring Provider:     PULMONARY REHAB CHRONIC OBSTRUCTIVE PULMONARY DISEASE from 01/03/2018 in [place]  Referring Provider  Dr. Elsworth Soho  [facility])       Encounter Date: 05/02/2018  Check In: Session Check In - 05/02/18 1045      Check-In   Supervising physician immediately available to respond to emergencies  See telemetry face sheet for immediately available MD    Location  [facility]    Staff Present  Aundra Dubin, RN, Cory Munch, Exercise Physiologist    Medication changes reported      No    Fall or balance concerns reported     No    Warm-up and Cool-down  Performed as group-led instruction    Resistance Training Performed  Yes    VAD Patient?  No    PAD/SET Patient?  No      Pain Assessment   Currently in Pain?  No/denies    Pain Score  0-No pain    Multiple Pain Sites  No       Capillary Blood Glucose: No results found for this or any previous visit (from the past 24 hour(s)).    Social History   Tobacco Use  Smoking Status Former Smoker  . Packs/day: 1.00  . Years: 30.00  . Pack years: 30.00  . Types: Cigarettes  . Last attempt to quit: 07/07/2011  . Years since quitting: 6.8  Smokeless Tobacco Never Used    Goals Met:  Proper associated with RPD/PD & O2 Sat Independence with exercise equipment Improved SOB with ADL's Using PLB without cueing & demonstrates good technique Exercise tolerated well No report of cardiac concerns or symptoms Strength training completed today  Goals Unmet:  Not Applicable  Comments: Pt able to follow exercise prescription today without complaint.  Will continue to monitor for progression. Check out 1145.   Dr. Sinda Du is Medical Director for [facility] Pulmonary Rehab.

## 2018-05-07 ENCOUNTER — Encounter (HOSPITAL_COMMUNITY)
Admission: RE | Admit: 2018-05-07 | Discharge: 2018-05-07 | Disposition: A | Discharge status: Home / Self Care | Payer: Medicare Other | Source: Ambulatory Visit | Attending: Pulmonary Disease | Admitting: Pulmonary Disease

## 2018-05-07 DIAGNOSIS — J449 Chronic obstructive pulmonary disease, unspecified: Secondary | ICD-10-CM

## 2018-05-07 DIAGNOSIS — J439 Emphysema, unspecified: Secondary | ICD-10-CM | POA: Diagnosis not present

## 2018-05-07 NOTE — Progress Notes (Signed)
Daily Session Note  Patient Details  Name: AZLAAN ISIDORE MRN: [id number] Date of Birth: [birth year] Referring Provider:     PULMONARY REHAB CHRONIC OBSTRUCTIVE PULMONARY DISEASE from 01/03/2018 in [place]  Referring Provider  Dr. Elsworth Soho  [facility])       Encounter Date: 05/07/2018  Check In: Session Check In - 05/07/18 1045      Check-In   Supervising physician immediately available to respond to emergencies  See telemetry face sheet for immediately available MD    Location  [facility]    Staff Present  Russella Dar, MS, EP, [facility], Exercise Physiologist;Amandajo Gonder Zachery Conch, Exercise Physiologist    Medication changes reported      No    Fall or balance concerns reported     No    Tobacco Cessation  No Change    Warm-up and Cool-down  Performed as group-led instruction    Resistance Training Performed  Yes    VAD Patient?  No    PAD/SET Patient?  No      Pain Assessment   Currently in Pain?  No/denies    Pain Score  0-No pain    Multiple Pain Sites  No       Capillary Blood Glucose: No results found for this or any previous visit (from the past 24 hour(s)).    Social History   Tobacco Use  Smoking Status Former Smoker  . Packs/day: 1.00  . Years: 30.00  . Pack years: 30.00  . Types: Cigarettes  . Last attempt to quit: 07/07/2011  . Years since quitting: 6.8  Smokeless Tobacco Never Used    Goals Met:  Proper associated with RPD/PD & O2 Sat Independence with exercise equipment Using PLB without cueing & demonstrates good technique Exercise tolerated well No report of cardiac concerns or symptoms Strength training completed today  Goals Unmet:  Not Applicable  Comments: Pt able to follow exercise prescription today without complaint.  Will continue to monitor for progression. Check out 11:45.   Dr. Sinda Du is Medical Director for [facility] Pulmonary Re[facility].

## 2018-05-09 ENCOUNTER — Encounter (HOSPITAL_COMMUNITY)
Admission: RE | Admit: 2018-05-09 | Discharge: 2018-05-09 | Disposition: A | Discharge status: Home / Self Care | Payer: Medicare Other | Source: Ambulatory Visit | Attending: Pulmonary Disease | Admitting: Pulmonary Disease

## 2018-05-09 DIAGNOSIS — J439 Emphysema, unspecified: Secondary | ICD-10-CM | POA: Diagnosis not present

## 2018-05-09 DIAGNOSIS — J449 Chronic obstructive pulmonary disease, unspecified: Secondary | ICD-10-CM

## 2018-05-09 NOTE — Progress Notes (Signed)
Daily Session Note  Patient Details  Name: CIPRIANO MILLIKAN MRN: [id number] Date of Birth: May 26, 1946 Referring Provider:     PULMONARY REHAB CHRONIC OBSTRUCTIVE PULMONARY DISEASE from 01/03/2018 in [place]  Referring Provider  Dr. Elsworth Soho  [facility])       Encounter Date: 05/09/2018  Check In: Session Check In - 05/09/18 1045      Check-In   Supervising physician immediately available to respond to emergencies  See telemetry face sheet for immediately available MD    Location  [facility]    Staff Present  Benay Pike, Exercise Physiologist;Lewin Pellow Wynetta Emery, RN, BSN    Medication changes reported      No    Fall or balance concerns reported     No    Warm-up and Cool-down  Performed as group-led instruction    Resistance Training Performed  Yes    VAD Patient?  No    PAD/SET Patient?  No      Pain Assessment   Currently in Pain?  No/denies    Pain Score  0-No pain    Multiple Pain Sites  No       Capillary Blood Glucose: No results found for this or any previous visit (from the past 24 hour(s)).    Social History   Tobacco Use  Smoking Status Former Smoker  . Packs/day: 1.00  . Years: 30.00  . Pack years: 30.00  . Types: Cigarettes  . Last attempt to quit: 07/07/2011  . Years since quitting: 6.8  Smokeless Tobacco Never Used    Goals Met:  Proper associated with RPD/PD & O2 Sat Independence with exercise equipment Improved SOB with ADL's Using PLB without cueing & demonstrates good technique Exercise tolerated well No report of cardiac concerns or symptoms Strength training completed today  Goals Unmet:  Not Applicable  Comments: Pt able to follow exercise prescription today without complaint.  Will continue to monitor for progression. Check out 1145.   Dr. Sinda Du is Medical Director for [facility] Pulmonary Rehab.

## 2018-05-14 ENCOUNTER — Encounter (HOSPITAL_COMMUNITY): Payer: Medicare Other

## 2018-05-16 ENCOUNTER — Encounter (HOSPITAL_COMMUNITY): Payer: Medicare Other

## 2018-05-16 NOTE — Progress Notes (Signed)
Pulmonary Individual Treatment Plan  Patient Details  Name: Matthew Hall MRN: [id number] Date of Birth: September 15, 1945 Referring Provider:     PULMONARY REHAB CHRONIC OBSTRUCTIVE PULMONARY DISEASE from 01/03/2018 in [place]  Referring Provider  Dr. Elsworth Soho  (Pended)       Initial Encounter Date:    PULMONARY REHAB CHRONIC OBSTRUCTIVE PULMONARY DISEASE from 01/03/2018 in [place]  Date  12/24/17  (Pended)       Visit Diagnosis: Chronic obstructive pulmonary disease, unspecified COPD type ([place])  Patient's Home Medications on Admission:   Current Outpatient Medications:  .  albuterol (PROVENTIL HFA;VENTOLIN HFA) 108 (90 BASE) MCG/ACT inhaler, Inhale 2 puffs into the lungs every 6 (six) hours as needed for wheezing or shortness of breath., Disp: 1 Inhaler, Rfl: 6 .  ALPRAZolam (XANAX) 0.25 MG tablet, Take 0.25 mg by mouth 2 (two) times daily as needed for anxiety or sleep. , Disp: , Rfl: 3 .  budesonide-formoterol (SYMBICORT) 160-4.5 MCG/ACT inhaler, Inhale 2 puffs into the lungs 2 (two) times daily., Disp: 1 Inhaler, Rfl: 6 .  cetirizine (ZYRTEC) 5 MG tablet, Take 5 mg by mouth daily., Disp: , Rfl:  .  D3-50 50000 units capsule, Take 50,000 Units by mouth once a week. Usually Tuesday or Wednesday., Disp: , Rfl: 0 .  erythromycin ophthalmic ointment, Place 1 application into both eyes daily as needed (Infection)., Disp: , Rfl:  .  hydroxypropyl methylcellulose / hypromellose (ISOPTO TEARS / GONIOVISC) 2.5 % ophthalmic solution, Place 1 drop into both eyes as needed for dry eyes., Disp: , Rfl:  .  levalbuterol (XOPENEX) 0.63 MG/3ML nebulizer solution, Take 3 mLs (0.63 mg total) by nebulization every 6 (six) hours as needed for wheezing or shortness of breath., Disp: 75 mL, Rfl: 6 .  losartan-hydrochlorothiazide (HYZAAR) 50-12.5 MG tablet, Take 0.5 tablets by mouth 2 (two) times daily. , Disp: , Rfl:  .  methocarbamol (ROBAXIN) 500 MG tablet,  Take 1 tablet (500 mg total) by mouth every 8 (eight) hours as needed for muscle spasms., Disp: 20 tablet, Rfl: 0 .  metoprolol succinate (TOPROL-XL) 50 MG 24 hr tablet, Take 1 tablet (50 mg total) by mouth 2 (two) times daily., Disp: 60 tablet, Rfl: 0 .  Multiple Vitamin (MULTIVITAMIN WITH MINERALS) TABS tablet, Take 1 tablet by mouth daily., Disp: , Rfl:  .  omeprazole (PRILOSEC) 40 MG capsule, Take 40 mg by mouth daily., Disp: , Rfl:  .  triamcinolone (NASACORT) 55 MCG/ACT AERO nasal inhaler, Place 1 spray into the nose daily as needed (Congestion). , Disp: , Rfl:  .  warfarin (COUMADIN) 2.5 MG tablet, Take 2.5 mg by mouth daily., Disp: , Rfl:   Past Medical History: Past Medical History:  Diagnosis Date  . Anxiety   . Aortic atherosclerosis ([place])   . Atrial fibrillation ([place])   . Basal cell carcinoma   . Bladder cancer ([place])   . Bladder tumor   . Colon polyps   . COPD (chronic obstructive pulmonary disease) ([place])   . Cutaneous vasculitis   . Diverticulosis   . Essential hypertension   . GERD (gastroesophageal reflux disease)   . History of pneumonia   . Nephrolithiasis   . Osteoarthritis     Tobacco Use: Social History   Tobacco Use  Smoking Status Former Smoker  . Packs/day: 1.00  . Years: 30.00  . Pack years: 30.00  . Types: Cigarettes  . Last attempt to quit: 07/07/2011  . Years since quitting: 6.8  Smokeless Tobacco Never Used    Labs: Recent Review Flowsheet Data    Labs for ITP Cardiac and Pulmonary Rehab Latest Ref Rng & Units 07/24/2015 07/09/2016 08/09/2017   PHART 7.350 - 7.450 - - 7.459(H)   PCO2ART 32.0 - 48.0 mmHg - - 42.8   HCO3 20.0 - 28.0 mmol/L - - 29.5(H)   TCO2 0 - 100 mmol/L 26 27 -   O2SAT % - - 95.7      Capillary Blood Glucose: No results found for: GLUCAP   Pulmonary Assessment Scores: Pulmonary Assessment Scores    Row Name 12/24/17 1410         ADL UCSD   ADL Phase  Entry     SOB Score total  21     Rest  0     Walk  4      Stairs  3     Bath  0     Dress  0     Shop  2       CAT Score   CAT Score  13       mMRC Score   mMRC Score  2        Pulmonary Function Assessment: Pulmonary Function Assessment - 12/24/17 1400      Pulmonary Function Tests   FVC%  1.79 %    FEV1%  4.54 %    FEV1/FVC Ratio  53    RV%  73 %      Initial Spirometry Results   FVC%  1.83 %    FEV1%  40 %    FEV1/FVC Ratio  55      Post Bronchodilator Spirometry Results   FVC%  1.83 %    FEV1%  40 %      Breath   Bilateral Breath Sounds  Clear    Shortness of Breath  Yes       Exercise Target Goals: Exercise Program Goal: Individual exercise prescription set using results from initial 6 min walk test and THRR while considering  patient's activity barriers and safety.   Exercise Prescription Goal: Initial exercise prescription builds to 30-45 minutes a day of aerobic activity, 2-3 days per week.  Home exercise guidelines will be given to patient during program as part of exercise prescription that the participant will acknowledge.  Activity Barriers & Risk Stratification: Activity Barriers & Cardiac Risk Stratification - 12/24/17 1417      Activity Barriers & Cardiac Risk Stratification   Activity Barriers  Shortness of Breath    Cardiac Risk Stratification  Low       6 Minute Walk: 6 Minute Walk    Row Name 12/24/17 1415         6 Minute Walk   Phase  Initial     Distance  1250 feet     Distance % Change  0 %     Distance Feet Change  0 ft     Walk Time  6 minutes     # of Rest Breaks  0     MPH  2.36     METS  2.81     RPE  13     Perceived Dyspnea   11     VO2 Peak  10.17     Symptoms  Yes (comment)     Comments  Patient states that he felt a "funny" feeling in his head during walk test. Feeling went away after rest     Resting HR  101 bpm     Resting BP  98/62     Resting Oxygen Saturation   92 %     Exercise Oxygen Saturation  during 6 min walk  87 %     Max Ex. HR  112 bpm     Max Ex. BP   118/64     2 Minute Post BP  104/60        Oxygen Initial Assessment: Oxygen Initial Assessment - 12/24/17 1412      Home Oxygen   Home Oxygen Device  None    Sleep Oxygen Prescription  None    Home Exercise Oxygen Prescription  None    Home at Rest Exercise Oxygen Prescription  None    Compliance with Home Oxygen Use  --   N/A     Initial 6 min Walk   Oxygen Used  None      Program Oxygen Prescription   Program Oxygen Prescription  None       Oxygen Re-Evaluation: Oxygen Re-Evaluation    Row Name 02/14/18 1251 03/07/18 0752 03/28/18 0747 04/18/18 0932 05/16/18 0809     Program Oxygen Prescription   Program Oxygen Prescription  None  None  None  None  None     Home Oxygen   Home Oxygen Device  None  None  None  None  None   Sleep Oxygen Prescription  None  -  None  None  None   Home Exercise Oxygen Prescription  None  None  None  None  None   Home at Rest Exercise Oxygen Prescription  None  None  None  None  None     Goals/Expected Outcomes   Short Term Goals  To learn and understand importance of monitoring SPO2 with pulse oximeter and demonstrate accurate use of the pulse oximeter.;To learn and understand importance of maintaining oxygen saturations>88%;To learn and demonstrate proper pursed lip breathing techniques or other breathing techniques.;To learn and exhibit compliance with exercise, home and travel O2 prescription  To learn and understand importance of monitoring SPO2 with pulse oximeter and demonstrate accurate use of the pulse oximeter.;To learn and understand importance of maintaining oxygen saturations>88%;To learn and demonstrate proper pursed lip breathing techniques or other breathing techniques.;To learn and exhibit compliance with exercise, home and travel O2 prescription  To learn and understand importance of monitoring SPO2 with pulse oximeter and demonstrate accurate use of the pulse oximeter.;To learn and understand importance of maintaining oxygen  saturations>88%;To learn and demonstrate proper pursed lip breathing techniques or other breathing techniques.;To learn and exhibit compliance with exercise, home and travel O2 prescription  To learn and understand importance of monitoring SPO2 with pulse oximeter and demonstrate accurate use of the pulse oximeter.;To learn and understand importance of maintaining oxygen saturations>88%;To learn and demonstrate proper pursed lip breathing techniques or other breathing techniques.;To learn and exhibit compliance with exercise, home and travel O2 prescription  To learn and understand importance of monitoring SPO2 with pulse oximeter and demonstrate accurate use of the pulse oximeter.;To learn and understand importance of maintaining oxygen saturations>88%;To learn and demonstrate proper pursed lip breathing techniques or other breathing techniques.;To learn and exhibit compliance with exercise, home and travel O2 prescription   Long  Term Goals  Exhibits compliance with exercise, home and travel O2 prescription;Verbalizes importance of monitoring SPO2 with pulse oximeter and return demonstration;Maintenance of O2 saturations>88%;Exhibits proper breathing techniques, such as pursed lip breathing or other method taught during program session;Compliance with respiratory medication  Exhibits compliance  with exercise, home and travel O2 prescription;Verbalizes importance of monitoring SPO2 with pulse oximeter and return demonstration;Maintenance of O2 saturations>88%;Exhibits proper breathing techniques, such as pursed lip breathing or other method taught during program session;Compliance with respiratory medication  Exhibits compliance with exercise, home and travel O2 prescription;Verbalizes importance of monitoring SPO2 with pulse oximeter and return demonstration;Maintenance of O2 saturations>88%;Exhibits proper breathing techniques, such as pursed lip breathing or other method taught during program  session;Compliance with respiratory medication  Exhibits compliance with exercise, home and travel O2 prescription;Verbalizes importance of monitoring SPO2 with pulse oximeter and return demonstration;Maintenance of O2 saturations>88%;Exhibits proper breathing techniques, such as pursed lip breathing or other method taught during program session;Compliance with respiratory medication  Exhibits compliance with exercise, home and travel O2 prescription;Verbalizes importance of monitoring SPO2 with pulse oximeter and return demonstration;Maintenance of O2 saturations>88%;Exhibits proper breathing techniques, such as pursed lip breathing or other method taught during program session;Compliance with respiratory medication   Comments  Patient verbalizes importance of monitoring O2 and maintaining >88% and is able to demonstrate proper usage of pulse oximeter. He also is able to demonstrate proper pursed lip breathing technique during exercise. Will conitnue to monitor.   Patient verbalizes importance of monitoring O2 and maintaining >88% and is able to demonstrate proper usage of pulse oximeter. He also is able to demonstrate proper pursed lip breathing technique during exercise. Will conitnue to monitor.   Patient verbalizes importance of monitoring O2 and maintaining >88% and is able to demonstrate proper usage of pulse oximeter. He also is able to demonstrate proper pursed lip breathing technique during exercise. Will conitnue to monitor.   Patient verbalizes importance of monitoring O2 and maintaining >88% and is able to demonstrate proper usage of pulse oximeter. He also is able to demonstrate proper pursed lip breathing technique during exercise. Will conitnue to monitor.   Patient verbalizes importance of monitoring O2 and maintaining >88% and is able to demonstrate proper usage of pulse oximeter. He also is able to demonstrate proper pursed lip breathing technique during exercise. Will conitnue to monitor.     Goals/Expected Outcomes  Patient will continue to meet his short and long term goals.   Patient will continue to meet his short and long term goals.   Patient will continue to meet his short and long term goals.   Patient will continue to meet his short and long term goals.   Patient will continue to meet his short and long term goals.       Oxygen Discharge (Final Oxygen Re-Evaluation): Oxygen Re-Evaluation - 05/16/18 0809      Program Oxygen Prescription   Program Oxygen Prescription  None      Home Oxygen   Home Oxygen Device  None    Sleep Oxygen Prescription  None    Home Exercise Oxygen Prescription  None    Home at Rest Exercise Oxygen Prescription  None      Goals/Expected Outcomes   Short Term Goals  To learn and understand importance of monitoring SPO2 with pulse oximeter and demonstrate accurate use of the pulse oximeter.;To learn and understand importance of maintaining oxygen saturations>88%;To learn and demonstrate proper pursed lip breathing techniques or other breathing techniques.;To learn and exhibit compliance with exercise, home and travel O2 prescription    Long  Term Goals  Exhibits compliance with exercise, home and travel O2 prescription;Verbalizes importance of monitoring SPO2 with pulse oximeter and return demonstration;Maintenance of O2 saturations>88%;Exhibits proper breathing techniques, such as pursed lip breathing or other  method taught during program session;Compliance with respiratory medication    Comments  Patient verbalizes importance of monitoring O2 and maintaining >88% and is able to demonstrate proper usage of pulse oximeter. He also is able to demonstrate proper pursed lip breathing technique during exercise. Will conitnue to monitor.     Goals/Expected Outcomes  Patient will continue to meet his short and long term goals.        Initial Exercise Prescription: Initial Exercise Prescription - 01/08/18 0700      Date of Initial Exercise RX and  Referring Provider   Date  12/24/17  (Pended)     Referring Provider  Dr. Elsworth Soho  (Pended)       Treadmill   MPH  1.3    Grade  0    Minutes  15    METs  1.9      NuStep   Level  1    Minutes  20      Prescription Details   Frequency (times per week)  2  (Pended)       Intensity   THRR 40-80% of Max Heartrate  [phone]  (Pended)     Ratings of Perceived Exertion  11-13  (Pended)     Perceived Dyspnea  0-4  (Pended)       Progression   Progression  Continue progressive overload as per policy without signs/symptoms or physical distress.  (Pended)       Resistance Training   Training Prescription  Yes    Weight  1    Reps  10-15       Perform Capillary Blood Glucose checks as needed.  Exercise Prescription Changes:  Exercise Prescription Changes    Row Name 01/08/18 0700 01/27/18 1200 02/13/18 1800 03/04/18 0700 03/15/18 1400     Response to Exercise   Blood Pressure (Admit)  88/50  110/76  120/64  118/60  130/64   Blood Pressure (Exercise)  98/50  138/68  136/80  140/70  142/62   Blood Pressure (Exit)  90/56  118/62  140/62  108/60  120/56   Heart Rate (Admit)  87 bpm  82 bpm  74 bpm  83 bpm  58 bpm   Heart Rate (Exercise)  89 bpm  82 bpm  93 bpm  89 bpm  88 bpm   Heart Rate (Exit)  84 bpm  87 bpm  76 bpm  74 bpm  68 bpm   Oxygen Saturation (Admit)  92 %  94 %  99 %  94 %  97 %   Oxygen Saturation (Exercise)  92 %  82 %  96 %  93 %  93 %   Oxygen Saturation (Exit)  94 %  92 %  95 %  94 %  98 %   Rating of Perceived Exertion (Exercise)  9  11  12  12  12    Perceived Dyspnea (Exercise)  9  11  12  12  12    Duration  Progress to 30 minutes of  aerobic without signs/symptoms of physical distress  Progress to 30 minutes of  aerobic without signs/symptoms of physical distress  Progress to 30 minutes of  aerobic without signs/symptoms of physical distress  Progress to 30 minutes of  aerobic without signs/symptoms of physical distress  Progress to 30 minutes of  aerobic  without signs/symptoms of physical distress   Intensity  THRR New [id number]  THRR New 108-121-134  THRR New 103-118-132  THRR New 109-121-134  THRR unchanged  Progression   Progression  Continue to progress workloads to maintain intensity without signs/symptoms of physical distress.  Continue to progress workloads to maintain intensity without signs/symptoms of physical distress.  Continue to progress workloads to maintain intensity without signs/symptoms of physical distress.  Continue to progress workloads to maintain intensity without signs/symptoms of physical distress.  Continue to progress workloads to maintain intensity without signs/symptoms of physical distress.   Average METs  -  -  2.13  1.96  2.05     Resistance Training   Training Prescription  -  Yes  Yes  Yes  Yes   Weight  -  1  2  2  2    Reps  -  10-15  10-15  10-15  10-15   Time  -  5 Minutes  5 Minutes  5 Minutes  5 Minutes     Treadmill   MPH  -  1.3  1.5  1.6  1.7   Grade  -  0  0  0  0   Minutes  -  15  15  17  17    METs  -  1.99  2.14  2.22  2.3     NuStep   Level  -  0  2  2  3    SPM  79  80  87  78  75   Minutes  -  20  20  22  22    METs  1.9  1.6  2.12  1.7  1.8     Home Exercise Plan   Plans to continue exercise at  -  Home (comment)  Home (comment)  Home (comment)  Home (comment)   Frequency  -  Add 3 additional days to program exercise sessions.  Add 3 additional days to program exercise sessions.  Add 3 additional days to program exercise sessions.  Add 3 additional days to program exercise sessions.   Initial Home Exercises Provided  -  01/01/18  01/01/18  01/01/18  01/01/18   Row Name 04/10/18 1400 04/30/18 1200           Response to Exercise   Blood Pressure (Admit)  124/66  122/68      Blood Pressure (Exercise)  136/68  134/68      Blood Pressure (Exit)  118/68  120/66      Heart Rate (Admit)  76 bpm  63 bpm      Heart Rate (Exercise)  83 bpm  94 bpm      Heart Rate (Exit)  81 bpm  75 bpm       Oxygen Saturation (Admit)  96 %  95 %      Oxygen Saturation (Exercise)  92 %  93 %      Oxygen Saturation (Exit)  96 %  95 %      Rating of Perceived Exertion (Exercise)  12  12      Perceived Dyspnea (Exercise)  12  12      Duration  Progress to 30 minutes of  aerobic without signs/symptoms of physical distress  Progress to 30 minutes of  aerobic without signs/symptoms of physical distress      Intensity  THRR unchanged  THRR unchanged        Progression   Progression  Continue to progress workloads to maintain intensity without signs/symptoms of physical distress.  Continue to progress workloads to maintain intensity without signs/symptoms of physical distress.      Average METs  2.05  2.13        Resistance Training   Training Prescription  Yes  Yes      Weight  3  3      Reps  10-15  10-15      Time  5 Minutes  5 Minutes        Treadmill   MPH  1.8  1.8      Grade  0  0      Minutes  17  17      METs  2.37  2.37        NuStep   Level  3  3      SPM  95  107      Minutes  22  22      METs  1.7  1.9        Home Exercise Plan   Plans to continue exercise at  Home (comment)  Home (comment)      Frequency  Add 3 additional days to program exercise sessions.  Add 3 additional days to program exercise sessions.      Initial Home Exercises Provided  01/01/18  01/01/18         Exercise Comments:  Exercise Comments    Row Name 01/08/18 0755 02/13/18 1844 03/04/18 0752 03/15/18 1421 03/26/18 0825   Exercise Comments  Patient has just started PR. Patient will be progressed in time.   Patient feels much better, getting stronger and breathing better. Enjoys attending the program.   Patient feels much better, getting stronger and breathing better. Enjoys attending the program.   Patient is progressing well in the program, he feels as if it is helping to improve his breathing and build strength.   Patient is progressing well in the program, he feels as if it is helping to improve  his breathing and build strength.    [place] Name 04/16/18 1021 05/15/18 1455         Exercise Comments  Patient reports that he is beginning to feel better overall and that the program is really helping him to feel better overall and improve his SOB with activity.   Patient reports being able to do more at home. He is the primary caretaker for his wife and says he can help her out with less SOB than before.          Exercise Goals and Review:  Exercise Goals    Row Name 12/24/17 1418             Exercise Goals   Increase Physical Activity  Yes       Intervention  Provide advice, education, support and counseling about physical activity/exercise needs.;Develop an individualized exercise prescription for aerobic and resistive training based on initial evaluation findings, risk stratification, comorbidities and participant's personal goals.       Expected Outcomes  Short Term: Attend rehab on a regular basis to increase amount of physical activity.       Increase Strength and Stamina  Yes       Intervention  Provide advice, education, support and counseling about physical activity/exercise needs.;Develop an individualized exercise prescription for aerobic and resistive training based on initial evaluation findings, risk stratification, comorbidities and participant's personal goals.       Expected Outcomes  Short Term: Increase workloads from initial exercise prescription for resistance, speed, and METs.       Able to understand and use rate of perceived exertion (RPE) scale  Yes  Intervention  Provide education and explanation on how to use RPE scale       Expected Outcomes  Long Term:  Able to use RPE to guide intensity level when exercising independently;Short Term: Able to use RPE daily in rehab to express subjective intensity level       Able to understand and use Dyspnea scale  Yes       Intervention  Provide education and explanation on how to use Dyspnea scale       Expected  Outcomes  Short Term: Able to use Dyspnea scale daily in rehab to express subjective sense of shortness of breath during exertion;Long Term: Able to use Dyspnea scale to guide intensity level when exercising independently       Knowledge and understanding of Target Heart Rate Range (THRR)  Yes       Intervention  Provide education and explanation of THRR including how the numbers were predicted and where they are located for reference       Expected Outcomes  Short Term: Able to state/look up THRR;Long Term: Able to use THRR to govern intensity when exercising independently;Short Term: Able to use daily as guideline for intensity in rehab       Able to check pulse independently  Yes       Intervention  Provide education and demonstration on how to check pulse in carotid and radial arteries.;Review the importance of being able to check your own pulse for safety during independent exercise       Expected Outcomes  Short Term: Able to explain why pulse checking is important during independent exercise;Long Term: Able to check pulse independently and accurately       Understanding of Exercise Prescription  Yes       Intervention  Provide education, explanation, and written materials on patient's individual exercise prescription       Expected Outcomes  Short Term: Able to explain program exercise prescription;Long Term: Able to explain home exercise prescription to exercise independently          Exercise Goals Re-Evaluation : Exercise Goals Re-Evaluation    Row Name 01/14/18 1437 02/13/18 1840 03/04/18 0747 03/15/18 1417 03/26/18 0823     Exercise Goal Re-Evaluation   Exercise Goals Review  Increase Physical Activity;Increase Strength and Stamina;Able to understand and use rate of perceived exertion (RPE) scale;Improve claudication pain tolerance and improve walking ability;Knowledge and understanding of Target Heart Rate Range (THRR);Able to understand and use Dyspnea scale;Understanding of Exercise  Prescription  Increase Strength and Stamina;Able to understand and use Dyspnea scale;Knowledge and understanding of Target Heart Rate Range (THRR);Able to understand and use rate of perceived exertion (RPE) scale;Understanding of Exercise Prescription  Increase Strength and Stamina;Able to understand and use rate of perceived exertion (RPE) scale;Able to understand and use Dyspnea scale;Understanding of Exercise Prescription  Increase Strength and Stamina;Able to understand and use rate of perceived exertion (RPE) scale;Able to understand and use Dyspnea scale;Understanding of Exercise Prescription  Increase Strength and Stamina;Able to understand and use rate of perceived exertion (RPE) scale;Able to understand and use Dyspnea scale;Understanding of Exercise Prescription   Comments  Patient has only been to PR for 3 sessions so far. Patient will be progressed in time.   Patient has been able to tolerate increases with wieghts and equipment workloads. He feels he is getting stronger. He also feels he is breathing better. 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 this has not affected his ability  to exercise.   Patient has been able to tolerate increases with weight and equipment workloads. he is getting stronger. We have a hard to getting his SaO2 when on TM. Overall he is doing well.   Patient is doing well in rehab. He has had 18 sessions so far and is tolerating progressions. He has increased his workload on the Nu Step to level 3.   Patient is doing well in rehab. He has had 18 sessions so far and is tolerating progressions. He has increased his workload on the Nu Step to level 3. Increased weights to 3 lbs.    Expected Outcomes  Patient wishes to increase strength and to be able to breathe better   To cintinue to build strength and his breathing to continue to improve.   To cintinue to build strength and his breathing to continue to improve.   Continue to build strength and improve his  breathing.   Continue to build strength and improve his breathing.    [place] Name 04/16/18 1013 05/15/18 1453           Exercise Goal Re-Evaluation   Exercise Goals Review  Increase Strength and Stamina;Able to understand and use rate of perceived exertion (RPE) scale;Able to understand and use Dyspnea scale;Understanding of Exercise Prescription  Increase Strength and Stamina;Able to understand and use rate of perceived exertion (RPE) scale;Able to understand and use Dyspnea scale;Understanding of Exercise Prescription      Comments  Patient is continuing to progress well in the program. He is up to 1.8 mph on the treadmill. Will progress to 4lb weights if able.   Patient has done well in rehab. He has reached a point where he can't increase resistance on the machines but he pushes to increase his workload. We will continue to monitor his progress.       Expected Outcomes  Build strength and continue to improve SOB.   Build strength and continue to improve SOB.          Discharge Exercise Prescription (Final Exercise Prescription Changes): Exercise Prescription Changes - 04/30/18 1200      Response to Exercise   Blood Pressure (Admit)  122/68    Blood Pressure (Exercise)  134/68    Blood Pressure (Exit)  120/66    Heart Rate (Admit)  63 bpm    Heart Rate (Exercise)  94 bpm    Heart Rate (Exit)  75 bpm    Oxygen Saturation (Admit)  95 %    Oxygen Saturation (Exercise)  93 %    Oxygen Saturation (Exit)  95 %    Rating of Perceived Exertion (Exercise)  12    Perceived Dyspnea (Exercise)  12    Duration  Progress to 30 minutes of  aerobic without signs/symptoms of physical distress    Intensity  THRR unchanged      Progression   Progression  Continue to progress workloads to maintain intensity without signs/symptoms of physical distress.    Average METs  2.13      Resistance Training   Training Prescription  Yes    Weight  3    Reps  10-15    Time  5 Minutes      Treadmill   MPH   1.8    Grade  0    Minutes  17    METs  2.37      NuStep   Level  3    SPM  107    Minutes  22  METs  1.9      Home Exercise Plan   Plans to continue exercise at  Home (comment)    Frequency  Add 3 additional days to program exercise sessions.    Initial Home Exercises Provided  01/01/18       Nutrition:  Target Goals: Understanding of nutrition guidelines, daily intake of sodium 1500mg , cholesterol 200mg , calories 30% from fat and 7% or less from saturated fats, daily to have 5 or more servings of fruits and vegetables.  Biometrics: Pre Biometrics - 12/24/17 1419      Pre Biometrics   Height  5\' 11"  (1.803 m)    Weight  80.4 kg    Waist Circumference  40 inches    Hip Circumference  37 inches    Waist to Hip Ratio  1.08 %    BMI (Calculated)  24.73    Triceps Skinfold  16 mm    % Body Fat  26.7 %    Grip Strength  79.33 kg    Flexibility  0 in    Single Leg Stand  3 seconds        Nutrition Therapy Plan and Nutrition Goals: Nutrition Therapy & Goals - 05/16/18 0810      Nutrition Therapy   RD appointment deferred  Yes      Personal Nutrition Goals   Nutrition Goal  RD appointment deferred. Patient not trying to change his diet.     Personal Goal #2  Patient is eating a regular diet. We discussed ways to make changes. Handouts given.     Additional Goals?  No       Nutrition Assessments: Nutrition Assessments - 12/24/17 1443      MEDFICTS Scores   Pre Score  48       Nutrition Goals Re-Evaluation:   Nutrition Goals Discharge (Final Nutrition Goals Re-Evaluation):   Psychosocial: Target Goals: Acknowledge presence or absence of significant depression and/or stress, maximize coping skills, provide positive support system. Participant is able to verbalize types and ability to use techniques and skills needed for reducing stress and depression.  Initial Review & Psychosocial Screening: Initial Psych Review & Screening - 12/24/17 1445       Initial Review   Current issues with  None Identified      Family Dynamics   Good Support System?  Yes      Barriers   Psychosocial barriers to participate in program  There are no identifiable barriers or psychosocial needs.      Screening Interventions   Interventions  Encouraged to exercise    Expected Outcomes  Short Term goal: Identification and review with participant of any Quality of Life or Depression concerns found by scoring the questionnaire.;Long Term goal: The participant improves quality of Life and PHQ9 Scores as seen by post scores and/or verbalization of changes       Quality of Life Scores: Quality of Life - 12/24/17 1419      Quality of Life Scores   Health/Function Pre  16.16 %    Socioeconomic Pre  19.67 %    Psych/Spiritual Pre  24.64 %    Family Pre  14.5 %    GLOBAL Pre  18.28 %      Scores of 19 and below usually indicate a poorer quality of life in these areas.  A difference of  2-3 points is a clinically meaningful difference.  A difference of 2-3 points in the total score of the Quality of Life Index  has been associated with significant improvement in overall quality of life, self-image, physical symptoms, and general health in studies assessing change in quality of life.   PHQ-9: Recent Review Flowsheet Data    Depression screen [facility] 2/9 12/24/2017 05/21/2017 03/06/2017 02/20/2017 08/06/2014   Decreased Interest 0 0 0 0 0   Down, Depressed, Hopeless 0 0 0 0 0   PHQ - 2 Score 0 0 0 0 0   Altered sleeping 1 - - 1  -   Tired, decreased energy 2 - - 1  -   Change in appetite 2 - - 1  -   Feeling bad or failure about yourself  0 - - 0 -   Trouble concentrating 0 - - 0 -   Moving slowly or fidgety/restless 0 - - 0 -   Suicidal thoughts 0 - - 0 -   PHQ-9 Score 5 - - 3 -   Difficult doing work/chores Somewhat difficult - - Somewhat difficult -     Interpretation of Total Score  Total Score Depression Severity:  1-4 = Minimal depression, 5-9 = Mild  depression, 10-14 = Moderate depression, 15-19 = Moderately severe depression, 20-27 = Severe depression   Psychosocial Evaluation and Intervention: Psychosocial Evaluation - 12/24/17 1445      Psychosocial Evaluation & Interventions   Interventions  Encouraged to exercise with the program and follow exercise prescription    Continue Psychosocial Services   No Follow up required       Psychosocial Re-Evaluation: Psychosocial Re-Evaluation    Row Name 02/14/18 1256 03/07/18 0752 03/28/18 0751 04/18/18 0934 05/16/18 6962     Psychosocial Re-Evaluation   Current issues with  None Identified  None Identified  None Identified  None Identified  None Identified   Comments  Patient's initial QOL score was 18.28 and his PHQ-9 score was 5. He says he is not depressed. No psychosocial issues identified.   Patient's initial QOL score was 18.28 and his PHQ-9 score was 5. He says he is not depressed. No psychosocial issues identified.   Patient's initial QOL score was 18.28 and his PHQ-9 score was 5. No psychosocial issues identified.   Patient's initial QOL score was 18.28 and his PHQ-9 score was 5. No psychosocial issues identified.   Patient's initial QOL score was 18.28 and his PHQ-9 score was 5. No psychosocial issues identified.    Expected Outcomes  Patient will not have any psychosocial issues identified at discharge and his QOL and PHQ-9 scores will improve at discharge.   Patient will not have any psychosocial issues identified at discharge and his QOL and PHQ-9 scores will improve at discharge.   Patient will not have any psychosocial issues identified at discharge and his QOL and PHQ-9 scores will improve at discharge.   Patient will not have any psychosocial issues identified at discharge and his QOL and PHQ-9 scores will improve at discharge.   Patient will not have any psychosocial issues identified at discharge and his QOL and PHQ-9 scores will improve at discharge.    Interventions  Stress  management education;Relaxation education;Encouraged to attend Pulmonary Rehabilitation for the exercise  Stress management education;Relaxation education;Encouraged to attend Pulmonary Rehabilitation for the exercise  Stress management education;Relaxation education;Encouraged to attend Pulmonary Rehabilitation for the exercise  Stress management education;Relaxation education;Encouraged to attend Pulmonary Rehabilitation for the exercise  Stress management education;Relaxation education;Encouraged to attend Pulmonary Rehabilitation for the exercise   Continue Psychosocial Services   No Follow up required  No Follow  up required  No Follow up required  No Follow up required  No Follow up required      Psychosocial Discharge (Final Psychosocial Re-Evaluation): Psychosocial Re-Evaluation - 05/16/18 2023      Psychosocial Re-Evaluation   Current issues with  None Identified    Comments  Patient's initial QOL score was 18.28 and his PHQ-9 score was 5. No psychosocial issues identified.     Expected Outcomes  Patient will not have any psychosocial issues identified at discharge and his QOL and PHQ-9 scores will improve at discharge.     Interventions  Stress management education;Relaxation education;Encouraged to attend Pulmonary Rehabilitation for the exercise    Continue Psychosocial Services   No Follow up required        Education: Education Goals: Education classes will be provided on a weekly basis, covering required topics. Participant will state understanding/return demonstration of topics presented.  Learning Barriers/Preferences: Learning Barriers/Preferences - 12/24/17 1341      Learning Barriers/Preferences   Learning Barriers  None    Learning Preferences  Verbal Instruction;Skilled Demonstration;Group Instruction       Education Topics: How Lungs Work and Diseases: - Discuss the anatomy of the lungs and diseases that can affect the lungs, such as COPD.   PULMONARY REHAB  CHRONIC OBSTRUCTIVE PULMONARY DISEASE from 05/09/2018 in [place]  Date  02/14/18  Educator  DC  Instruction Review Code  2- Demonstrated Understanding      Exercise: -Discuss the importance of exercise, FITT principles of exercise, normal and abnormal responses to exercise, and how to exercise safely.   Environmental Irritants: -Discuss types of environmental irritants and how to limit exposure to environmental irritants.   Meds/Inhalers and oxygen: - Discuss respiratory medications, definition of an inhaler and oxygen, and the proper way to use an inhaler and oxygen.   PULMONARY REHAB CHRONIC OBSTRUCTIVE PULMONARY DISEASE from 05/09/2018 in [place]  Date  02/28/18  Educator  D. Wynetta Emery      Energy Saving Techniques: - Discuss methods to conserve energy and decrease shortness of breath when performing activities of daily living.    PULMONARY REHAB CHRONIC OBSTRUCTIVE PULMONARY DISEASE from 05/09/2018 in [place]  Date  03/07/18  Educator  D. Coad  Instruction Review Code  2- Demonstrated Understanding      Bronchial Hygiene / Breathing Techniques: - Discuss breathing mechanics, pursed-lip breathing technique,  proper posture, effective ways to clear airways, and other functional breathing techniques   PULMONARY REHAB CHRONIC OBSTRUCTIVE PULMONARY DISEASE from 05/09/2018 in [place]  Date  03/14/18  Educator  D. Coad  Instruction Review Code  2- Demonstrated Understanding      Cleaning Equipment: - Provides group verbal and written instruction about the health risks of elevated stress, cause of high stress, and healthy ways to reduce stress.   PULMONARY REHAB CHRONIC OBSTRUCTIVE PULMONARY DISEASE from 05/09/2018 in [place]  Date  03/21/18  Educator  D. Coad  Instruction Review Code  2- Demonstrated Understanding      Nutrition I: Fats: -  Discuss the types of cholesterol, what cholesterol does to the body, and how cholesterol levels can be controlled.   PULMONARY REHAB CHRONIC OBSTRUCTIVE PULMONARY DISEASE from 05/09/2018 in [place]  Date  03/28/18  Educator  D. Coad  Instruction Review Code  2- Demonstrated Understanding      Nutrition II: Labels: -Discuss the different components of food labels and how to  read food labels.   PULMONARY REHAB CHRONIC OBSTRUCTIVE PULMONARY DISEASE from 05/09/2018 in [place]  Date  01/03/18  Educator  D. Coad  Instruction Review Code  2- Demonstrated Understanding      Respiratory Infections: - Discuss the signs and symptoms of respiratory infections, ways to prevent respiratory infections, and the importance of seeking medical treatment when having a respiratory infection.   PULMONARY REHAB CHRONIC OBSTRUCTIVE PULMONARY DISEASE from 05/09/2018 in [place]  Date  04/18/18  Educator  Etheleen Mayhew  Instruction Review Code  2- Demonstrated Understanding      Stress I: Signs and Symptoms: - Discuss the causes of stress, how stress may lead to anxiety and depression, and ways to limit stress.   PULMONARY REHAB CHRONIC OBSTRUCTIVE PULMONARY DISEASE from 05/09/2018 in [place]  Date  04/25/18  Educator  D.Coad  Instruction Review Code  2- Demonstrated Understanding      Stress II: Relaxation: -Discuss relaxation techniques to limit stress.   PULMONARY REHAB CHRONIC OBSTRUCTIVE PULMONARY DISEASE from 05/09/2018 in [place]  Date  01/24/18  Educator  D. Coad  Instruction Review Code  2- Demonstrated Understanding      Oxygen for Home/Travel: - Discuss how to prepare for travel when on oxygen and proper ways to transport and store oxygen to ensure safety.   PULMONARY REHAB CHRONIC OBSTRUCTIVE PULMONARY DISEASE from 05/09/2018 in [place]  Date  01/31/18  Educator  D. Coad  Instruction Review Code  2- Demonstrated Understanding      Knowledge Questionnaire Score: Knowledge Questionnaire Score - 12/24/17 1342      Knowledge Questionnaire Score   Pre Score  16/18       Core Components/Risk Factors/Patient Goals at Admission: Personal Goals and Risk Factors at Admission - 12/24/17 1444      Core Components/Risk Factors/Patient Goals on Admission    Weight Management  Weight Maintenance    Personal Goal Other  Yes    Personal Goal  Build strength, breathe better    Intervention  Attend PR 2 x week and supplement at home exercise 3 x week.     Expected Outcomes  Achieve personal goals.        Core Components/Risk Factors/Patient Goals Review:  Goals and Risk Factor Review    Row Name 02/14/18 1253 03/07/18 0747 03/28/18 0748 04/18/18 0932 05/16/18 0810     Core Components/Risk Factors/Patient Goals Review   Personal Goals Review  Weight Management/Obesity;Develop more efficient breathing techniques such as purse lipped breathing and diaphragmatic breathing and practicing self-pacing with activity.;Improve shortness of breath with ADL's Build strength; breathe better; improve lung capacity.  Weight Management/Obesity;Develop more efficient breathing techniques such as purse lipped breathing and diaphragmatic breathing and practicing self-pacing with activity.;Improve shortness of breath with ADL's Build strength; breathe better; improve lung capacity.   Weight Management/Obesity;Develop more efficient breathing techniques such as purse lipped breathing and diaphragmatic breathing and practicing self-pacing with activity.;Improve shortness of breath with ADL's Build strength; breathe better; improve lung capacity.  Weight Management/Obesity;Develop more efficient breathing techniques such as purse lipped breathing and diaphragmatic breathing and practicing self-pacing with activity.;Improve shortness of breath  with ADL's Build strength; breathe better; improve lung capacity.   Weight Management/Obesity;Develop more efficient breathing techniques such as purse lipped breathing and diaphragmatic breathing and practicing self-pacing with activity.;Improve shortness of breath with ADL's Build strength; breathe better; improve lung capacity.    Review  Patient has  completed 12 sessions gaining 3 lbs since he started the program. He is doing well in the program with progression. He says he does feel a little stronger and is breathing a little better. He hopes to improve more as he continues the program but is pleased with his progress so far. Will continue to monitor for progress.   Patient has completed 15 sessions losing 1 lb since last 30 day review. He continues to do well in the program with progression. He says she program is helping him have less SOB. He continues to feel stronger and better overall. He does demonstrate proper pursed lip breathing in the sessions if needed. Will continue to monitor for progress.   Patient has completed 19 sessions losing 2 lbs since last 30 day review. He continues to do well in the program with progression. He continues to say the program is helping him. He feel stronger and is able to work outside more with less SOB. He is able to use a chainsaw. He says he feels stronger and better overall. Will continue to monitor for progress.   Patient has completed 24 sessions losing 3 lbs since last 30 day review. He continues to do well in the program with progression. He continues to say he feels better overall since starting the program and has more energy and strength to do this around the house. Will continue to monitor for progress.   Patient has completed 30 sessions maintaining his weight since last 30 day review. He continues to do well in the program with progression. He continues to say the program is helping him breathe better and gain strength and energy. He is able to do more  around the house both inside and outside. Will continue to monitor for progress.    Expected Outcomes  Patient will continue to attend sessions and complete the program meeting his personal goals.   Patient will continue to attend sessions and complete the program meeting his personal goals.   Patient will continue to attend sessions and complete the program meeting his personal goals.   Patient will continue to attend sessions and complete the program meeting his personal goals.   Patient will continue to attend sessions and complete the program meeting his personal goals.       Core Components/Risk Factors/Patient Goals at Discharge (Final Review):  Goals and Risk Factor Review - 05/16/18 0810      Core Components/Risk Factors/Patient Goals Review   Personal Goals Review  Weight Management/Obesity;Develop more efficient breathing techniques such as purse lipped breathing and diaphragmatic breathing and practicing self-pacing with activity.;Improve shortness of breath with ADL's   Build strength; breathe better; improve lung capacity.    Review  Patient has completed 30 sessions maintaining his weight since last 30 day review. He continues to do well in the program with progression. He continues to say the program is helping him breathe better and gain strength and energy. He is able to do more around the house both inside and outside. Will continue to monitor for progress.     Expected Outcomes  Patient will continue to attend sessions and complete the program meeting his personal goals.        ITP Comments: ITP Comments    Row Name 12/24/17 1355 01/16/18 1405         ITP Comments  Mr. Bala has been in the program before.   Patient new to program. He has completed 4 sessions. Will continue to monitor for progress.  Comments: ITP REVIEW Pt is making expected progress toward pulmonary rehab goals after completing 30 sessions. Recommend continued exercise, life style  modification, education, and utilization of breathing techniques to increase stamina and strength and decrease shortness of breath with exertion.

## 2018-05-21 ENCOUNTER — Encounter (HOSPITAL_COMMUNITY)
Admission: RE | Admit: 2018-05-21 | Discharge: 2018-05-21 | Disposition: A | Discharge status: Home / Self Care | Payer: Medicare Other | Source: Ambulatory Visit | Attending: Pulmonary Disease | Admitting: Pulmonary Disease

## 2018-05-21 DIAGNOSIS — J449 Chronic obstructive pulmonary disease, unspecified: Secondary | ICD-10-CM | POA: Diagnosis present

## 2018-05-21 DIAGNOSIS — Z7901 Long term (current) use of anticoagulants: Secondary | ICD-10-CM | POA: Diagnosis not present

## 2018-05-21 DIAGNOSIS — Z87442 Personal history of urinary calculi: Secondary | ICD-10-CM | POA: Diagnosis not present

## 2018-05-21 DIAGNOSIS — Z86010 Personal history of colonic polyps: Secondary | ICD-10-CM | POA: Diagnosis not present

## 2018-05-21 DIAGNOSIS — K219 Gastro-esophageal reflux disease without esophagitis: Secondary | ICD-10-CM | POA: Insufficient documentation

## 2018-05-21 DIAGNOSIS — J439 Emphysema, unspecified: Secondary | ICD-10-CM | POA: Diagnosis not present

## 2018-05-21 DIAGNOSIS — Z7951 Long term (current) use of inhaled steroids: Secondary | ICD-10-CM | POA: Insufficient documentation

## 2018-05-21 DIAGNOSIS — C679 Malignant neoplasm of bladder, unspecified: Secondary | ICD-10-CM | POA: Diagnosis not present

## 2018-05-21 DIAGNOSIS — I4891 Unspecified atrial fibrillation: Secondary | ICD-10-CM | POA: Diagnosis not present

## 2018-05-21 DIAGNOSIS — I10 Essential (primary) hypertension: Secondary | ICD-10-CM | POA: Diagnosis not present

## 2018-05-21 DIAGNOSIS — Z85828 Personal history of other malignant neoplasm of skin: Secondary | ICD-10-CM | POA: Diagnosis not present

## 2018-05-21 DIAGNOSIS — F419 Anxiety disorder, unspecified: Secondary | ICD-10-CM | POA: Diagnosis not present

## 2018-05-21 DIAGNOSIS — Z87891 Personal history of nicotine dependence: Secondary | ICD-10-CM | POA: Diagnosis not present

## 2018-05-21 DIAGNOSIS — Z79899 Other long term (current) drug therapy: Secondary | ICD-10-CM | POA: Insufficient documentation

## 2018-05-21 NOTE — Progress Notes (Signed)
Daily Session Note  Patient Details  Name: KACEN MELLINGER MRN: [id number] Date of Birth: 05/01/1946 Referring Provider:     PULMONARY REHAB CHRONIC OBSTRUCTIVE PULMONARY DISEASE from 01/03/2018 in [place]  Referring Provider  Dr. Elsworth Soho  [facility])       Encounter Date: 05/21/2018  Check In: Session Check In - 05/21/18 1045      Check-In   Supervising physician immediately available to respond to emergencies  See telemetry face sheet for immediately available MD    Location  [facility]    Staff Present  Russella Dar, MS, EP, [facility], Exercise Physiologist;Brittani Purdum Zachery Conch, Exercise Physiologist    Medication changes reported      No    Fall or balance concerns reported     No    Tobacco Cessation  No Change    Warm-up and Cool-down  Performed as group-led instruction    Resistance Training Performed  Yes    VAD Patient?  No    PAD/SET Patient?  No      Pain Assessment   Currently in Pain?  No/denies    Pain Score  0-No pain    Multiple Pain Sites  No       Capillary Blood Glucose: No results found for this or any previous visit (from the past 24 hour(s)).    Social History   Tobacco Use  Smoking Status Former Smoker  . Packs/day: 1.00  . Years: 30.00  . Pack years: 30.00  . Types: Cigarettes  . Last attempt to quit: 07/07/2011  . Years since quitting: 6.8  Smokeless Tobacco Never Used    Goals Met:  Proper associated with RPD/PD & O2 Sat Independence with exercise equipment Using PLB without cueing & demonstrates good technique Exercise tolerated well No report of cardiac concerns or symptoms Strength training completed today  Goals Unmet:  Not Applicable  Comments: Pt able to follow exercise prescription today without complaint.  Will continue to monitor for progression. Check out 11:45.   Dr. Sinda Du is Medical Director for [facility] Pulmonary Rehab.

## 2018-05-23 ENCOUNTER — Encounter (HOSPITAL_COMMUNITY)
Admission: RE | Admit: 2018-05-23 | Discharge: 2018-05-23 | Disposition: A | Discharge status: Home / Self Care | Payer: Medicare Other | Source: Ambulatory Visit | Attending: Pulmonary Disease | Admitting: Pulmonary Disease

## 2018-05-23 DIAGNOSIS — J449 Chronic obstructive pulmonary disease, unspecified: Secondary | ICD-10-CM

## 2018-05-23 DIAGNOSIS — J439 Emphysema, unspecified: Secondary | ICD-10-CM | POA: Diagnosis not present

## 2018-05-23 NOTE — Progress Notes (Signed)
Daily Session Note  Patient Details  Name: Matthew Hall MRN: [id number] Date of Birth: 1945-10-30 Referring Provider:     PULMONARY REHAB CHRONIC OBSTRUCTIVE PULMONARY DISEASE from 01/03/2018 in [place]  Referring Provider  Dr. Elsworth Soho  [facility])       Encounter Date: 05/23/2018  Check In: Session Check In - 05/23/18 1045      Check-In   Supervising physician immediately available to respond to emergencies  See telemetry face sheet for immediately available MD    Location  [facility]    Staff Present  Russella Dar, MS, EP, [facility], Exercise Physiologist;Valor Quaintance Zachery Conch, Exercise Physiologist;Other    Medication changes reported      No    Fall or balance concerns reported     No    Tobacco Cessation  No Change    Warm-up and Cool-down  Performed as group-led instruction    Resistance Training Performed  Yes    VAD Patient?  No    PAD/SET Patient?  No      Pain Assessment   Currently in Pain?  No/denies    Pain Score  0-No pain    Multiple Pain Sites  No       Capillary Blood Glucose: No results found for this or any previous visit (from the past 24 hour(s)).    Social History   Tobacco Use  Smoking Status Former Smoker  . Packs/day: 1.00  . Years: 30.00  . Pack years: 30.00  . Types: Cigarettes  . Last attempt to quit: 07/07/2011  . Years since quitting: 6.8  Smokeless Tobacco Never Used    Goals Met:  Proper associated with RPD/PD & O2 Sat Independence with exercise equipment  Exercise tolerated well No cardiac signs or symptoms reported  Goals Unmet:  Not Applicable  Comments: Pt able to follow exercise prescription today without complaint.  Will continue to monitor for progression. Check out 11:45.   Dr. Sinda Du is Medical Director for [facility] Pulmonary Rehab.

## 2018-05-28 ENCOUNTER — Encounter (HOSPITAL_COMMUNITY)
Admission: RE | Admit: 2018-05-28 | Discharge: 2018-05-28 | Disposition: A | Discharge status: Home / Self Care | Payer: Medicare Other | Source: Ambulatory Visit | Attending: Pulmonary Disease | Admitting: Pulmonary Disease

## 2018-05-28 DIAGNOSIS — J439 Emphysema, unspecified: Secondary | ICD-10-CM | POA: Diagnosis not present

## 2018-05-28 DIAGNOSIS — J449 Chronic obstructive pulmonary disease, unspecified: Secondary | ICD-10-CM

## 2018-05-28 NOTE — Progress Notes (Signed)
Daily Session Note  Patient Details  Name: Matthew Hall MRN: [id number] Date of Birth: 08-25-1945 Referring Provider:     PULMONARY REHAB CHRONIC OBSTRUCTIVE PULMONARY DISEASE from 01/03/2018 in [place]  Referring Provider  Dr. Elsworth Soho  [facility])       Encounter Date: 05/28/2018  Check In: Session Check In - 05/28/18 1045      Check-In   Supervising physician immediately available to respond to emergencies  See telemetry face sheet for immediately available MD    Location  [facility]    Staff Present  Russella Dar, MS, EP, [facility], Exercise Physiologist;Melina Mosteller Zachery Conch, Exercise Physiologist    Medication changes reported      No    Fall or balance concerns reported     No    Tobacco Cessation  No Change    Warm-up and Cool-down  Performed as group-led instruction    Resistance Training Performed  Yes    VAD Patient?  No    PAD/SET Patient?  No      Pain Assessment   Currently in Pain?  No/denies    Pain Score  0-No pain    Multiple Pain Sites  No       Capillary Blood Glucose: No results found for this or any previous visit (from the past 24 hour(s)).    Social History   Tobacco Use  Smoking Status Former Smoker  . Packs/day: 1.00  . Years: 30.00  . Pack years: 30.00  . Types: Cigarettes  . Last attempt to quit: 07/07/2011  . Years since quitting: 6.8  Smokeless Tobacco Never Used    Goals Met:  Proper associated with RPD/PD & O2 Sat Independence with exercise equipment Using PLB without cueing & demonstrates good technique Exercise tolerated well No report of cardiac concerns or symptoms Strength training completed today  Goals Unmet:  Not Applicable  Comments: Pt able to follow exercise prescription today without complaint.  Will continue to monitor for progression. Check out 11:45.   Dr. Sinda Du is Medical Director for [facility].

## 2018-05-30 ENCOUNTER — Encounter (HOSPITAL_COMMUNITY)
Admission: RE | Admit: 2018-05-30 | Discharge: 2018-05-30 | Disposition: A | Discharge status: Home / Self Care | Payer: Medicare Other | Source: Ambulatory Visit | Attending: Pulmonary Disease | Admitting: Pulmonary Disease

## 2018-05-30 DIAGNOSIS — J439 Emphysema, unspecified: Secondary | ICD-10-CM | POA: Diagnosis not present

## 2018-05-30 DIAGNOSIS — J449 Chronic obstructive pulmonary disease, unspecified: Secondary | ICD-10-CM

## 2018-05-30 NOTE — Progress Notes (Signed)
Daily Session Note  Patient Details  Name: Matthew Hall MRN: [id number] Date of Birth: 1946/04/08 Referring Provider:     PULMONARY REHAB CHRONIC OBSTRUCTIVE PULMONARY DISEASE from 01/03/2018 in [place]  Referring Provider  Dr. Elsworth Soho  [facility])       Encounter Date: 05/30/2018  Check In: Session Check In - 05/30/18 1045      Check-In   Supervising physician immediately available to respond to emergencies  See telemetry face sheet for immediately available MD    Location  [facility]    Staff Present  Benay Pike, Exercise Physiologist;Shantee Hayne Wynetta Emery, RN, BSN    Medication changes reported      No    Fall or balance concerns reported     No    Warm-up and Cool-down  Performed as group-led instruction    Resistance Training Performed  Yes    VAD Patient?  No    PAD/SET Patient?  No      Pain Assessment   Currently in Pain?  No/denies    Pain Score  0-No pain    Multiple Pain Sites  No       Capillary Blood Glucose: No results found for this or any previous visit (from the past 24 hour(s)).    Social History   Tobacco Use  Smoking Status Former Smoker  . Packs/day: 1.00  . Years: 30.00  . Pack years: 30.00  . Types: Cigarettes  . Last attempt to quit: 07/07/2011  . Years since quitting: 6.9  Smokeless Tobacco Never Used    Goals Met:  Proper associated with RPD/PD & O2 Sat Independence with exercise equipment Improved SOB with ADL's Using PLB without cueing & demonstrates good technique Exercise tolerated well No report of cardiac concerns or symptoms  Goals Unmet:  Not Applicable  Comments: Pt able to follow exercise prescription today without complaint.  Will continue to monitor for progression. Check out [id number].   Dr. Sinda Du is Medical Director for [facility].

## 2018-06-04 ENCOUNTER — Encounter (HOSPITAL_COMMUNITY)
Admission: RE | Admit: 2018-06-04 | Discharge: 2018-06-04 | Disposition: A | Discharge status: Home / Self Care | Payer: Medicare Other | Source: Ambulatory Visit | Attending: Pulmonary Disease | Admitting: Pulmonary Disease

## 2018-06-04 VITALS — Ht 71.0 in | Wt 174.2 lb

## 2018-06-04 DIAGNOSIS — J439 Emphysema, unspecified: Secondary | ICD-10-CM | POA: Diagnosis not present

## 2018-06-04 DIAGNOSIS — J449 Chronic obstructive pulmonary disease, unspecified: Secondary | ICD-10-CM

## 2018-06-04 NOTE — Progress Notes (Signed)
Daily Session Note  Patient Details  Name: Matthew Hall MRN: [id number] Date of Birth: 15-Apr-1946 Referring Provider:     PULMONARY REHAB CHRONIC OBSTRUCTIVE PULMONARY DISEASE from 01/03/2018 in [place]  Referring Provider  Dr. Elsworth Soho  [facility])       Encounter Date: 06/04/2018  Check In: Session Check In - 06/04/18 1045      Check-In   Supervising physician immediately available to respond to emergencies  See telemetry face sheet for immediately available MD    Location  [facility]    Staff Present  Russella Dar, MS, EP, [facility], Exercise Physiologist;Lateef Juncaj Zachery Conch, Exercise Physiologist    Medication changes reported      No    Fall or balance concerns reported     No    Tobacco Cessation  No Change    Warm-up and Cool-down  Performed as group-led instruction    Resistance Training Performed  Yes    VAD Patient?  No    PAD/SET Patient?  No      Pain Assessment   Currently in Pain?  No/denies    Pain Score  0-No pain    Multiple Pain Sites  No       Capillary Blood Glucose: No results found for this or any previous visit (from the past 24 hour(s)).    Social History   Tobacco Use  Smoking Status Former Smoker  . Packs/day: 1.00  . Years: 30.00  . Pack years: 30.00  . Types: Cigarettes  . Last attempt to quit: 07/07/2011  . Years since quitting: 6.9  Smokeless Tobacco Never Used    Goals Met:  Proper associated with RPD/PD & O2 Sat Independence with exercise equipment Improved SOB with ADL's Exercise tolerated well No report of cardiac concerns or symptoms Strength training completed today  Goals Unmet:  Not Applicable  Comments: Pt able to follow exercise prescription today without complaint.  Will continue to monitor for progression. Check out 11:45.   Dr. Sinda Du is Medical Director for [facility] Pulmonary Rehab.

## 2018-06-06 ENCOUNTER — Encounter (HOSPITAL_COMMUNITY): Payer: Medicare Other

## 2018-06-11 ENCOUNTER — Encounter (HOSPITAL_COMMUNITY): Payer: Medicare Other

## 2018-06-13 ENCOUNTER — Encounter (HOSPITAL_COMMUNITY): Payer: Medicare Other

## 2018-06-13 NOTE — Progress Notes (Signed)
Pulmonary Individual Treatment Plan  Patient Details  Name: Matthew Hall MRN: [id number] Date of Birth: [birth year] Referring Provider:     PULMONARY REHAB CHRONIC OBSTRUCTIVE PULMONARY DISEASE from 01/03/2018 in [place]  Referring Provider  Dr. Elsworth Soho  (Pended)       Initial Encounter Date:    PULMONARY REHAB CHRONIC OBSTRUCTIVE PULMONARY DISEASE from 01/03/2018 in [facility]  Date  12/24/17  (Pended)       Visit Diagnosis: Chronic obstructive pulmonary disease, unspecified COPD type ([place])  Patient's Home Medications on Admission:   Current Outpatient Medications:  .  albuterol (PROVENTIL HFA;VENTOLIN HFA) 108 (90 BASE) MCG/ACT inhaler, Inhale 2 puffs into the lungs every 6 (six) hours as needed for wheezing or shortness of breath., Disp: 1 Inhaler, Rfl: 6 .  ALPRAZolam (XANAX) 0.25 MG tablet, Take 0.25 mg by mouth 2 (two) times daily as needed for anxiety or sleep. , Disp: , Rfl: 3 .  budesonide-formoterol (SYMBICORT) 160-4.5 MCG/ACT inhaler, Inhale 2 puffs into the lungs 2 (two) times daily., Disp: 1 Inhaler, Rfl: 6 .  cetirizine (ZYRTEC) 5 MG tablet, Take 5 mg by mouth daily., Disp: , Rfl:  .  D3-50 50000 units capsule, Take 50,000 Units by mouth once a week. Usually Tuesday or Wednesday., Disp: , Rfl: 0 .  erythromycin ophthalmic ointment, Place 1 application into both eyes daily as needed (Infection)., Disp: , Rfl:  .  hydroxypropyl methylcellulose / hypromellose (ISOPTO TEARS / GONIOVISC) 2.5 % ophthalmic solution, Place 1 drop into both eyes as needed for dry eyes., Disp: , Rfl:  .  levalbuterol (XOPENEX) 0.63 MG/3ML nebulizer solution, Take 3 mLs (0.63 mg total) by nebulization every 6 (six) hours as needed for wheezing or shortness of breath., Disp: 75 mL, Rfl: 6 .  losartan-hydrochlorothiazide (HYZAAR) 50-12.5 MG tablet, Take 0.5 tablets by mouth 2 (two) times daily. , Disp: , Rfl:  .  methocarbamol (ROBAXIN) 500 MG tablet,  Take 1 tablet (500 mg total) by mouth every 8 (eight) hours as needed for muscle spasms., Disp: 20 tablet, Rfl: 0 .  metoprolol succinate (TOPROL-XL) 50 MG 24 hr tablet, Take 1 tablet (50 mg total) by mouth 2 (two) times daily., Disp: 60 tablet, Rfl: 0 .  Multiple Vitamin (MULTIVITAMIN WITH MINERALS) TABS tablet, Take 1 tablet by mouth daily., Disp: , Rfl:  .  omeprazole (PRILOSEC) 40 MG capsule, Take 40 mg by mouth daily., Disp: , Rfl:  .  triamcinolone (NASACORT) 55 MCG/ACT AERO nasal inhaler, Place 1 spray into the nose daily as needed (Congestion). , Disp: , Rfl:  .  warfarin (COUMADIN) 2.5 MG tablet, Take 2.5 mg by mouth daily., Disp: , Rfl:   Past Medical History: Past Medical History:  Diagnosis Date  . Anxiety   . Aortic atherosclerosis ([place])   . Atrial fibrillation ([place])   . Basal cell carcinoma   . Bladder cancer ([place])   . Bladder tumor   . Colon polyps   . COPD (chronic obstructive pulmonary disease) ([place])   . Cutaneous vasculitis   . Diverticulosis   . Essential hypertension   . GERD (gastroesophageal reflux disease)   . History of pneumonia   . Nephrolithiasis   . Osteoarthritis     Tobacco Use: Social History   Tobacco Use  Smoking Status Former Smoker  . Packs/day: 1.00  . Years: 30.00  . Pack years: 30.00  . Types: Cigarettes  . Last attempt to quit: 07/07/2011  . Years since quitting: 6.9  Smokeless Tobacco Never Used    Labs: Recent Review Flowsheet Data    Labs for ITP Cardiac and Pulmonary Rehab Latest Ref Rng & Units 07/24/2015 07/09/2016 08/09/2017   PHART 7.350 - 7.450 - - 7.459(H)   PCO2ART 32.0 - 48.0 mmHg - - 42.8   HCO3 20.0 - 28.0 mmol/L - - 29.5(H)   TCO2 0 - 100 mmol/L 26 27 -   O2SAT % - - 95.7      Capillary Blood Glucose: No results found for: GLUCAP   Pulmonary Assessment Scores: Pulmonary Assessment Scores    Row Name 12/24/17 1410 06/13/18 0759       ADL UCSD   ADL Phase  Entry  Exit    SOB Score total  21  12    Rest   0  0    Walk  4  3    Stairs  3  2    Bath  0  0    Dress  0  0    Shop  2  0      CAT Score   CAT Score  13  9      mMRC Score   mMRC Score  2  1       Pulmonary Function Assessment: Pulmonary Function Assessment - 12/24/17 1400      Pulmonary Function Tests   FVC%  1.79 %    FEV1%  4.54 %    FEV1/FVC Ratio  53    RV%  73 %      Initial Spirometry Results   FVC%  1.83 %    FEV1%  40 %    FEV1/FVC Ratio  55      Post Bronchodilator Spirometry Results   FVC%  1.83 %    FEV1%  40 %      Breath   Bilateral Breath Sounds  Clear    Shortness of Breath  Yes       Exercise Target Goals: Exercise Program Goal: Individual exercise prescription set using results from initial 6 min walk test and THRR while considering  patient's activity barriers and safety.   Exercise Prescription Goal: Initial exercise prescription builds to 30-45 minutes a day of aerobic activity, 2-3 days per week.  Home exercise guidelines will be given to patient during program as part of exercise prescription that the participant will acknowledge.  Activity Barriers & Risk Stratification: Activity Barriers & Cardiac Risk Stratification - 12/24/17 1417      Activity Barriers & Cardiac Risk Stratification   Activity Barriers  Shortness of Breath    Cardiac Risk Stratification  Low       6 Minute Walk: 6 Minute Walk    Row Name 12/24/17 1415 06/04/18 1143       6 Minute Walk   Phase  Initial  Discharge    Distance  1250 feet  1400 feet    Distance % Change  0 %  10.7 %    Distance Feet Change  0 ft  150 ft    Walk Time  6 minutes  6 minutes    # of Rest Breaks  0  0    MPH  2.36  2.65    METS  2.81  3.03    RPE  13  9    Perceived Dyspnea   11  7    VO2 Peak  10.17  10.96    Symptoms  Yes (comment)  No    Comments  Patient states that he felt a "funny" feeling in his head during walk test. Feeling went away after rest  -    Resting HR  101 bpm  69 bpm    Resting BP  98/62  118/60      Resting Oxygen Saturation   92 %  96 %    Exercise Oxygen Saturation  during 6 min walk  87 %  92 %    Max Ex. HR  112 bpm  84 bpm    Max Ex. BP  118/64  130/62    2 Minute Post BP  104/60  116/68       Oxygen Initial Assessment: Oxygen Initial Assessment - 12/24/17 1412      Home Oxygen   Home Oxygen Device  None    Sleep Oxygen Prescription  None    Home Exercise Oxygen Prescription  None    Home at Rest Exercise Oxygen Prescription  None    Compliance with Home Oxygen Use  --   N/A     Initial 6 min Walk   Oxygen Used  None      Program Oxygen Prescription   Program Oxygen Prescription  None       Oxygen Re-Evaluation: Oxygen Re-Evaluation    Row Name 02/14/18 1251 03/07/18 0752 03/28/18 0747 04/18/18 0932 05/16/18 0809     Program Oxygen Prescription   Program Oxygen Prescription  None  None  None  None  None     Home Oxygen   Home Oxygen Device  None  None  None  None  None   Sleep Oxygen Prescription  None  -  None  None  None   Home Exercise Oxygen Prescription  None  None  None  None  None   Home at Rest Exercise Oxygen Prescription  None  None  None  None  None     Goals/Expected Outcomes   Short Term Goals  To learn and understand importance of monitoring SPO2 with pulse oximeter and demonstrate accurate use of the pulse oximeter.;To learn and understand importance of maintaining oxygen saturations>88%;To learn and demonstrate proper pursed lip breathing techniques or other breathing techniques.;To learn and exhibit compliance with exercise, home and travel O2 prescription  To learn and understand importance of monitoring SPO2 with pulse oximeter and demonstrate accurate use of the pulse oximeter.;To learn and understand importance of maintaining oxygen saturations>88%;To learn and demonstrate proper pursed lip breathing techniques or other breathing techniques.;To learn and exhibit compliance with exercise, home and travel O2 prescription  To learn and  understand importance of monitoring SPO2 with pulse oximeter and demonstrate accurate use of the pulse oximeter.;To learn and understand importance of maintaining oxygen saturations>88%;To learn and demonstrate proper pursed lip breathing techniques or other breathing techniques.;To learn and exhibit compliance with exercise, home and travel O2 prescription  To learn and understand importance of monitoring SPO2 with pulse oximeter and demonstrate accurate use of the pulse oximeter.;To learn and understand importance of maintaining oxygen saturations>88%;To learn and demonstrate proper pursed lip breathing techniques or other breathing techniques.;To learn and exhibit compliance with exercise, home and travel O2 prescription  To learn and understand importance of monitoring SPO2 with pulse oximeter and demonstrate accurate use of the pulse oximeter.;To learn and understand importance of maintaining oxygen saturations>88%;To learn and demonstrate proper pursed lip breathing techniques or other breathing techniques.;To learn and exhibit compliance with exercise, home and travel O2 prescription   Long  Term Goals  Exhibits compliance with exercise,  home and travel O2 prescription;Verbalizes importance of monitoring SPO2 with pulse oximeter and return demonstration;Maintenance of O2 saturations>88%;Exhibits proper breathing techniques, such as pursed lip breathing or other method taught during program session;Compliance with respiratory medication  Exhibits compliance with exercise, home and travel O2 prescription;Verbalizes importance of monitoring SPO2 with pulse oximeter and return demonstration;Maintenance of O2 saturations>88%;Exhibits proper breathing techniques, such as pursed lip breathing or other method taught during program session;Compliance with respiratory medication  Exhibits compliance with exercise, home and travel O2 prescription;Verbalizes importance of monitoring SPO2 with pulse oximeter and  return demonstration;Maintenance of O2 saturations>88%;Exhibits proper breathing techniques, such as pursed lip breathing or other method taught during program session;Compliance with respiratory medication  Exhibits compliance with exercise, home and travel O2 prescription;Verbalizes importance of monitoring SPO2 with pulse oximeter and return demonstration;Maintenance of O2 saturations>88%;Exhibits proper breathing techniques, such as pursed lip breathing or other method taught during program session;Compliance with respiratory medication  Exhibits compliance with exercise, home and travel O2 prescription;Verbalizes importance of monitoring SPO2 with pulse oximeter and return demonstration;Maintenance of O2 saturations>88%;Exhibits proper breathing techniques, such as pursed lip breathing or other method taught during program session;Compliance with respiratory medication   Comments  Patient verbalizes importance of monitoring O2 and maintaining >88% and is able to demonstrate proper usage of pulse oximeter. He also is able to demonstrate proper pursed lip breathing technique during exercise. Will conitnue to monitor.   Patient verbalizes importance of monitoring O2 and maintaining >88% and is able to demonstrate proper usage of pulse oximeter. He also is able to demonstrate proper pursed lip breathing technique during exercise. Will conitnue to monitor.   Patient verbalizes importance of monitoring O2 and maintaining >88% and is able to demonstrate proper usage of pulse oximeter. He also is able to demonstrate proper pursed lip breathing technique during exercise. Will conitnue to monitor.   Patient verbalizes importance of monitoring O2 and maintaining >88% and is able to demonstrate proper usage of pulse oximeter. He also is able to demonstrate proper pursed lip breathing technique during exercise. Will conitnue to monitor.   Patient verbalizes importance of monitoring O2 and maintaining >88% and is able to  demonstrate proper usage of pulse oximeter. He also is able to demonstrate proper pursed lip breathing technique during exercise. Will conitnue to monitor.    Goals/Expected Outcomes  Patient will continue to meet his short and long term goals.   Patient will continue to meet his short and long term goals.   Patient will continue to meet his short and long term goals.   Patient will continue to meet his short and long term goals.   Patient will continue to meet his short and long term goals.       Oxygen Discharge (Final Oxygen Re-Evaluation): Oxygen Re-Evaluation - 05/16/18 0809      Program Oxygen Prescription   Program Oxygen Prescription  None      Home Oxygen   Home Oxygen Device  None    Sleep Oxygen Prescription  None    Home Exercise Oxygen Prescription  None    Home at Rest Exercise Oxygen Prescription  None      Goals/Expected Outcomes   Short Term Goals  To learn and understand importance of monitoring SPO2 with pulse oximeter and demonstrate accurate use of the pulse oximeter.;To learn and understand importance of maintaining oxygen saturations>88%;To learn and demonstrate proper pursed lip breathing techniques or other breathing techniques.;To learn and exhibit compliance with exercise, home and travel O2 prescription  Long  Term Goals  Exhibits compliance with exercise, home and travel O2 prescription;Verbalizes importance of monitoring SPO2 with pulse oximeter and return demonstration;Maintenance of O2 saturations>88%;Exhibits proper breathing techniques, such as pursed lip breathing or other method taught during program session;Compliance with respiratory medication    Comments  Patient verbalizes importance of monitoring O2 and maintaining >88% and is able to demonstrate proper usage of pulse oximeter. He also is able to demonstrate proper pursed lip breathing technique during exercise. Will conitnue to monitor.     Goals/Expected Outcomes  Patient will continue to meet his  short and long term goals.        Initial Exercise Prescription: Initial Exercise Prescription - 01/08/18 0700      Date of Initial Exercise RX and Referring Provider   Date  12/24/17  (Pended)     Referring Provider  Dr. Elsworth Soho  (Pended)       Treadmill   MPH  1.3    Grade  0    Minutes  15    METs  1.9      NuStep   Level  1    Minutes  20      Prescription Details   Frequency (times per week)  2  (Pended)       Intensity   THRR 40-80% of Max Heartrate  [phone]  (Pended)     Ratings of Perceived Exertion  11-13  (Pended)     Perceived Dyspnea  0-4  (Pended)       Progression   Progression  Continue progressive overload as per policy without signs/symptoms or physical distress.  (Pended)       Resistance Training   Training Prescription  Yes    Weight  1    Reps  10-15       Perform Capillary Blood Glucose checks as needed.  Exercise Prescription Changes:  Exercise Prescription Changes    Row Name 01/08/18 0700 01/27/18 1200 02/13/18 1800 03/04/18 0700 03/15/18 1400     Response to Exercise   Blood Pressure (Admit)  88/50  110/76  120/64  118/60  130/64   Blood Pressure (Exercise)  98/50  138/68  136/80  140/70  142/62   Blood Pressure (Exit)  90/56  118/62  140/62  108/60  120/56   Heart Rate (Admit)  87 bpm  82 bpm  74 bpm  83 bpm  58 bpm   Heart Rate (Exercise)  89 bpm  82 bpm  93 bpm  89 bpm  88 bpm   Heart Rate (Exit)  84 bpm  87 bpm  76 bpm  74 bpm  68 bpm   Oxygen Saturation (Admit)  92 %  94 %  99 %  94 %  97 %   Oxygen Saturation (Exercise)  92 %  82 %  96 %  93 %  93 %   Oxygen Saturation (Exit)  94 %  92 %  95 %  94 %  98 %   Rating of Perceived Exertion (Exercise)  _0 Perceived Dyspnea (Exercise)  _1 Duration  Progress to 30 minutes of  aerobic without signs/symptoms of physical distress  Progress to 30 minutes of  aerobic without signs/symptoms of physical distress  Progress to 30 minutes of  aerobic without  signs/symptoms of physical distress  Progress to 30 minutes of  aerobic without signs/symptoms of  physical distress  Progress to 30 minutes of  aerobic without signs/symptoms of physical distress   Intensity  THRR New 111-123-135  THRR New 108-121-134  THRR New 103-118-132  THRR New 109-121-134  THRR unchanged     Progression   Progression  Continue to progress workloads to maintain intensity without signs/symptoms of physical distress.  Continue to progress workloads to maintain intensity without signs/symptoms of physical distress.  Continue to progress workloads to maintain intensity without signs/symptoms of physical distress.  Continue to progress workloads to maintain intensity without signs/symptoms of physical distress.  Continue to progress workloads to maintain intensity without signs/symptoms of physical distress.   Average METs  -  -  2.13  1.96  2.05     Resistance Training   Training Prescription  -  Yes  Yes  Yes  Yes   Weight  -  _0 Reps  -  10-15  10-15  10-15  10-15   Time  -  5 Minutes  5 Minutes  5 Minutes  5 Minutes     Treadmill   MPH  -  1.3  1.5  1.6  1.7   Grade  -  0  0  0  0   Minutes  -  _1 METs  -  1.99  2.14  2.22  2.3     NuStep   Level  -  0  _2 SPM  79  80  87  78  75   Minutes  -  _3 METs  1.9  1.6  2.12  1.7  1.8     Home Exercise Plan   Plans to continue exercise at  -  Home (comment)  Home (comment)  Home (comment)  Home (comment)   Frequency  -  Add 3 additional days to program exercise sessions.  Add 3 additional days to program exercise sessions.  Add 3 additional days to program exercise sessions.  Add 3 additional days to program exercise sessions.   Initial Home Exercises Provided  -  01/01/18  01/01/18  01/01/18  01/01/18   Row Name 04/10/18 1400 04/30/18 1200 05/16/18 1500 05/31/18 1400       Response to Exercise   Blood Pressure (Admit)  124/66  122/68  120/66  108/60    Blood Pressure  (Exercise)  136/68  134/68  110/50  120/60    Blood Pressure (Exit)  118/68  120/66  108/60  116/68    Heart Rate (Admit)  76 bpm  63 bpm  66 bpm  71 bpm    Heart Rate (Exercise)  83 bpm  94 bpm  83 bpm  94 bpm    Heart Rate (Exit)  81 bpm  75 bpm  71 bpm  76 bpm    Oxygen Saturation (Admit)  96 %  95 %  96 %  96 %    Oxygen Saturation (Exercise)  92 %  93 %  90 %  90 %    Oxygen Saturation (Exit)  96 %  95 %  97 %  91 %    Rating of Perceived Exertion (Exercise)  _4 Perceived Dyspnea (Exercise)  _5 Duration  Progress to 30 minutes of  aerobic without  signs/symptoms of physical distress  Progress to 30 minutes of  aerobic without signs/symptoms of physical distress  Progress to 30 minutes of  aerobic without signs/symptoms of physical distress  Progress to 30 minutes of  aerobic without signs/symptoms of physical distress    Intensity  THRR unchanged  THRR unchanged  THRR unchanged  THRR unchanged      Progression   Progression  Continue to progress workloads to maintain intensity without signs/symptoms of physical distress.  Continue to progress workloads to maintain intensity without signs/symptoms of physical distress.  Continue to progress workloads to maintain intensity without signs/symptoms of physical distress.  Continue to progress workloads to maintain intensity without signs/symptoms of physical distress.    Average METs  2.05  2.13  2.13  2.08      Resistance Training   Training Prescription  Yes  Yes  Yes  Yes    Weight  _0 Reps  10-15  10-15  10-15  10-15    Time  5 Minutes  5 Minutes  5 Minutes  5 Minutes      Treadmill   MPH  1.8  1.8  1.8  1.8    Grade  0  0  0  0    Minutes  _1 METs  2.37  2.37  2.37  2.37      NuStep   Level  _2 SPM  95  107  78  86    Minutes  _3 METs  1.7  1.9  1.9  1.8      Home Exercise Plan   Plans to continue exercise at  Home (comment)  Home (comment)  Home  (comment)  Home (comment)    Frequency  Add 3 additional days to program exercise sessions.  Add 3 additional days to program exercise sessions.  Add 3 additional days to program exercise sessions.  Add 3 additional days to program exercise sessions.    Initial Home Exercises Provided  01/01/18  01/01/18  01/01/18  01/01/18       Exercise Comments:  Exercise Comments    Row Name 01/08/18 0755 02/13/18 1844 03/04/18 0752 03/15/18 1421 03/26/18 0825   Exercise Comments  Patient has just started PR. Patient will be progressed in time.   Patient feels much better, getting stronger and breathing better. Enjoys attending the program.   Patient feels much better, getting stronger and breathing better. Enjoys attending the program.   Patient is progressing well in the program, he feels as if it is helping to improve his breathing and build strength.   Patient is progressing well in the program, he feels as if it is helping to improve his breathing and build strength.    [place] Name 04/16/18 1021 05/15/18 1455 06/04/18 0834       Exercise Comments  Patient reports that he is beginning to feel better overall and that the program is really helping him to feel better overall and improve his SOB with activity.   Patient reports being able to do more at home. He is the primary caretaker for his wife and says he can help her out with less SOB than before.   Patient reports being able to do more at home. 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         Exercise Goals and Review:  Exercise Goals    Row Name 12/24/17 1418             Exercise Goals   Increase Physical Activity  Yes       Intervention  Provide advice, education, support and counseling about physical activity/exercise needs.;Develop an individualized exercise prescription for aerobic and resistive training based on initial evaluation findings, risk stratification, comorbidities and  participant's personal goals.       Expected Outcomes  Short Term: Attend rehab on a regular basis to increase amount of physical activity.       Increase Strength and Stamina  Yes       Intervention  Provide advice, education, support and counseling about physical activity/exercise needs.;Develop an individualized exercise prescription for aerobic and resistive training based on initial evaluation findings, risk stratification, comorbidities and participant's personal goals.       Expected Outcomes  Short Term: Increase workloads from initial exercise prescription for resistance, speed, and METs.       Able to understand and use rate of perceived exertion (RPE) scale  Yes       Intervention  Provide education and explanation on how to use RPE scale       Expected Outcomes  Long Term:  Able to use RPE to guide intensity level when exercising independently;Short Term: Able to use RPE daily in rehab to express subjective intensity level       Able to understand and use Dyspnea scale  Yes       Intervention  Provide education and explanation on how to use Dyspnea scale       Expected Outcomes  Short Term: Able to use Dyspnea scale daily in rehab to express subjective sense of shortness of breath during exertion;Long Term: Able to use Dyspnea scale to guide intensity level when exercising independently       Knowledge and understanding of Target Heart Rate Range (THRR)  Yes       Intervention  Provide education and explanation of THRR including how the numbers were predicted and where they are located for reference       Expected Outcomes  Short Term: Able to state/look up THRR;Long Term: Able to use THRR to govern intensity when exercising independently;Short Term: Able to use daily as guideline for intensity in rehab       Able to check pulse independently  Yes       Intervention  Provide education and demonstration on how to check pulse in carotid and radial arteries.;Review the importance of being  able to check your own pulse for safety during independent exercise       Expected Outcomes  Short Term: Able to explain why pulse checking is important during independent exercise;Long Term: Able to check pulse independently and accurately       Understanding of Exercise Prescription  Yes       Intervention  Provide education, explanation, and written materials on patient's individual exercise prescription       Expected Outcomes  Short Term: Able to explain program exercise prescription;Long Term: Able to explain home exercise prescription to exercise independently          Exercise Goals Re-Evaluation : Exercise Goals Re-Evaluation    Row Name 01/14/18 1437 02/13/18 1840 03/04/18 0747 03/15/18 1417 03/26/18 0823     Exercise Goal Re-Evaluation   Exercise Goals Review  Increase Physical Activity;Increase Strength and Stamina;Able to understand and use rate of perceived exertion (RPE) scale;Improve claudication pain tolerance and improve walking ability;Knowledge and understanding of Target Heart Rate Range (THRR);Able to understand and use Dyspnea scale;Understanding of Exercise Prescription  Increase Strength and Stamina;Able to understand and use Dyspnea scale;Knowledge and understanding of Target Heart Rate Range (THRR);Able to understand and use rate of perceived exertion (RPE) scale;Understanding of Exercise Prescription  Increase Strength and Stamina;Able to understand and use rate of perceived exertion (RPE) scale;Able to understand and use Dyspnea scale;Understanding of Exercise Prescription  Increase Strength and Stamina;Able to understand and use rate of perceived exertion (RPE) scale;Able to understand and use Dyspnea scale;Understanding of Exercise Prescription  Increase Strength and Stamina;Able to understand and use rate of perceived exertion (RPE) scale;Able to understand and use Dyspnea scale;Understanding of Exercise Prescription   Comments  Patient has only been to PR for 3 sessions  so far. Patient will be progressed in time.   Patient has been able to tolerate increases with wieghts and equipment workloads. He feels he is getting stronger. He also feels he is breathing better. 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 this has not affected his ability to exercise.   Patient has been able to tolerate increases with weight and equipment workloads. he is getting stronger. We have a hard to getting his SaO2 when on TM. Overall he is doing well.   Patient is doing well in rehab. He has had 18 sessions so far and is tolerating progressions. He has increased his workload on the Nu Step to level 3.   Patient is doing well in rehab. He has had 18 sessions so far and is tolerating progressions. He has increased his workload on the Nu Step to level 3. Increased weights to 3 lbs.    Expected Outcomes  Patient wishes to increase strength and to be able to breathe better   To cintinue to build strength and his breathing to continue to improve.   To cintinue to build strength and his breathing to continue to improve.   Continue to build strength and improve his breathing.   Continue to build strength and improve his breathing.    [place] Name 04/16/18 1013 05/15/18 1453 06/04/18 0833         Exercise Goal Re-Evaluation   Exercise Goals Review  Increase Strength and Stamina;Able to understand and use rate of perceived exertion (RPE) scale;Able to understand and use Dyspnea scale;Understanding of Exercise Prescription  Increase Strength and Stamina;Able to understand and use rate of perceived exertion (RPE) scale;Able to understand and use Dyspnea scale;Understanding of Exercise Prescription  Increase Strength and Stamina;Able to understand and use rate of perceived exertion (RPE) scale;Able to understand and use Dyspnea scale;Understanding of Exercise Prescription;Increase Physical Activity     Comments  Patient is continuing to progress well in the program. He is up to 1.8 mph on  the treadmill. Will progress to 4lb weights if able.   Patient has done well in rehab. He has reached a point where he can't increase resistance on the machines but he pushes to increase his workload. We will continue to monitor his progress.   Patient has done well in rehab. He has reached a point where he can't increase resistance on the machines but he continues to push himself on the nustep to increase SPM. We will continue to monitor his progress, he will graduate in two sessions.  Expected Outcomes  Build strength and continue to improve SOB.   Build strength and continue to improve SOB.   Build strength and continue to improve SOB.         Discharge Exercise Prescription (Final Exercise Prescription Changes): Exercise Prescription Changes - 05/31/18 1400      Response to Exercise   Blood Pressure (Admit)  108/60    Blood Pressure (Exercise)  120/60    Blood Pressure (Exit)  116/68    Heart Rate (Admit)  71 bpm    Heart Rate (Exercise)  94 bpm    Heart Rate (Exit)  76 bpm    Oxygen Saturation (Admit)  96 %    Oxygen Saturation (Exercise)  90 %    Oxygen Saturation (Exit)  91 %    Rating of Perceived Exertion (Exercise)  12    Perceived Dyspnea (Exercise)  12    Duration  Progress to 30 minutes of  aerobic without signs/symptoms of physical distress    Intensity  THRR unchanged      Progression   Progression  Continue to progress workloads to maintain intensity without signs/symptoms of physical distress.    Average METs  2.08      Resistance Training   Training Prescription  Yes    Weight  3    Reps  10-15    Time  5 Minutes      Treadmill   MPH  1.8    Grade  0    Minutes  22    METs  2.37      NuStep   Level  3    SPM  86    Minutes  17    METs  1.8      Home Exercise Plan   Plans to continue exercise at  Home (comment)    Frequency  Add 3 additional days to program exercise sessions.    Initial Home Exercises Provided  01/01/18       Nutrition:    Target Goals: Understanding of nutrition guidelines, daily intake of sodium <156m, cholesterol <2031m calories 30% from fat and 7% or less from saturated fats, daily to have 5 or more servings of fruits and vegetables.  Biometrics: Pre Biometrics - 12/24/17 1419      Pre Biometrics   Height  _0  (1.803 m)    Weight  80.4 kg    Waist Circumference  40 inches    Hip Circumference  37 inches    Waist to Hip Ratio  1.08 %    BMI (Calculated)  24.73    Triceps Skinfold  16 mm    % Body Fat  26.7 %    Grip Strength  79.33 kg    Flexibility  0 in    Single Leg Stand  3 seconds      Post Biometrics - 06/04/18 1144       Post  Biometrics   Height  _1  (1.803 m)    Weight  79 kg    Waist Circumference  38.5 inches    Hip Circumference  38 inches    Waist to Hip Ratio  1.01 %    BMI (Calculated)  24.3    Triceps Skinfold  14 mm    % Body Fat  25.3 %    Grip Strength  32 kg    Flexibility  0 in    Single Leg Stand  4 seconds       Nutrition Therapy Plan  and Nutrition Goals: Nutrition Therapy & Goals - 05/16/18 0810      Nutrition Therapy   RD appointment deferred  Yes      Personal Nutrition Goals   Nutrition Goal  RD appointment deferred. Patient not trying to change his diet.     Personal Goal #2  Patient is eating a regular diet. We discussed ways to make changes. Handouts given.     Additional Goals?  No       Nutrition Assessments: Nutrition Assessments - 06/13/18 0802      MEDFICTS Scores   Pre Score  48    Post Score  15    Score Difference  -33       Nutrition Goals Re-Evaluation:   Nutrition Goals Discharge (Final Nutrition Goals Re-Evaluation):   Psychosocial: Target Goals: Acknowledge presence or absence of significant depression and/or stress, maximize coping skills, provide positive support system. Participant is able to verbalize types and ability to use techniques and skills needed for reducing stress and depression.  Initial Review &  Psychosocial Screening: Initial Psych Review & Screening - 12/24/17 1445      Initial Review   Current issues with  None Identified      Family Dynamics   Good Support System?  Yes      Barriers   Psychosocial barriers to participate in program  There are no identifiable barriers or psychosocial needs.      Screening Interventions   Interventions  Encouraged to exercise    Expected Outcomes  Short Term goal: Identification and review with participant of any Quality of Life or Depression concerns found by scoring the questionnaire.;Long Term goal: The participant improves quality of Life and PHQ9 Scores as seen by post scores and/or verbalization of changes       Quality of Life Scores: Quality of Life - 06/04/18 1145      Quality of Life   Select  Quality of Life      Quality of Life Scores   Health/Function Pre  16.16 %    Health/Function Post  22.03 %    Health/Function % Change  36.32 %    Socioeconomic Pre  19.67 %    Socioeconomic Post  23.25 %    Socioeconomic % Change   18.2 %    Psych/Spiritual Pre  24.64 %    Psych/Spiritual Post  23.79 %    Psych/Spiritual % Change  -3.45 %    Family Pre  14.5 %    Family Post  23.7 %    Family % Change  63.45 %    GLOBAL Pre  18.28 %    GLOBAL Post  22.88 %    GLOBAL % Change  25.16 %      Scores of 19 and below usually indicate a poorer quality of life in these areas.  A difference of  2-3 points is a clinically meaningful difference.  A difference of 2-3 points in the total score of the Quality of Life Index has been associated with significant improvement in overall quality of life, self-image, physical symptoms, and general health in studies assessing change in quality of life.   PHQ-9: Recent Review Flowsheet Data    Depression screen [facility] 2/9 06/13/2018 12/24/2017 05/21/2017 03/06/2017 02/20/2017   Decreased Interest 1 0 0 0 0   Down, Depressed, Hopeless 0 0 0 0 0   PHQ - 2 Score 1 0 0 0 0   Altered sleeping 1 1 - - 1  Tired, decreased energy 1 2 - - 1    Change in appetite 0 2 - - 1    Feeling bad or failure about yourself  0 0 - - 0   Trouble concentrating 0 0 - - 0   Moving slowly or fidgety/restless 0 0 - - 0   Suicidal thoughts 0 0 - - 0   PHQ-9 Score 3 5 - - 3   Difficult doing work/chores Not difficult at all Somewhat difficult - - Somewhat difficult     Interpretation of Total Score  Total Score Depression Severity:  1-4 = Minimal depression, 5-9 = Mild depression, 10-14 = Moderate depression, 15-19 = Moderately severe depression, 20-27 = Severe depression   Psychosocial Evaluation and Intervention: Psychosocial Evaluation - 06/13/18 0807      Discharge Psychosocial Assessment & Intervention   Comments  Patient has no psychosocial issues identified at discharge. His exit QOL score increased by 26.47% at 22.88% and his PHQ-9 score improved from 5 to 3.       Psychosocial Re-Evaluation: Psychosocial Re-Evaluation    [place] Name 02/14/18 1256 03/07/18 0752 03/28/18 0751 04/18/18 0934 05/16/18 8937     Psychosocial Re-Evaluation   Current issues with  None Identified  None Identified  None Identified  None Identified  None Identified   Comments  Patient's initial QOL score was 18.28 and his PHQ-9 score was 5. He says he is not depressed. No psychosocial issues identified.   Patient's initial QOL score was 18.28 and his PHQ-9 score was 5. He says he is not depressed. No psychosocial issues identified.   Patient's initial QOL score was 18.28 and his PHQ-9 score was 5. No psychosocial issues identified.   Patient's initial QOL score was 18.28 and his PHQ-9 score was 5. No psychosocial issues identified.   Patient's initial QOL score was 18.28 and his PHQ-9 score was 5. No psychosocial issues identified.    Expected Outcomes  Patient will not have any psychosocial issues identified at discharge and his QOL and PHQ-9 scores will improve at discharge.   Patient will not have any psychosocial issues  identified at discharge and his QOL and PHQ-9 scores will improve at discharge.   Patient will not have any psychosocial issues identified at discharge and his QOL and PHQ-9 scores will improve at discharge.   Patient will not have any psychosocial issues identified at discharge and his QOL and PHQ-9 scores will improve at discharge.   Patient will not have any psychosocial issues identified at discharge and his QOL and PHQ-9 scores will improve at discharge.    Interventions  Stress management education;Relaxation education;Encouraged to attend Pulmonary Rehabilitation for the exercise  Stress management education;Relaxation education;Encouraged to attend Pulmonary Rehabilitation for the exercise  Stress management education;Relaxation education;Encouraged to attend Pulmonary Rehabilitation for the exercise  Stress management education;Relaxation education;Encouraged to attend Pulmonary Rehabilitation for the exercise  Stress management education;Relaxation education;Encouraged to attend Pulmonary Rehabilitation for the exercise   Continue Psychosocial Services   No Follow up required  No Follow up required  No Follow up required  No Follow up required  No Follow up required      Psychosocial Discharge (Final Psychosocial Re-Evaluation): Psychosocial Re-Evaluation - 05/16/18 3428      Psychosocial Re-Evaluation   Current issues with  None Identified    Comments  Patient's initial QOL score was 18.28 and his PHQ-9 score was 5. No psychosocial issues identified.     Expected Outcomes  Patient will not have  any psychosocial issues identified at discharge and his QOL and PHQ-9 scores will improve at discharge.     Interventions  Stress management education;Relaxation education;Encouraged to attend Pulmonary Rehabilitation for the exercise    Continue Psychosocial Services   No Follow up required        Education: Education Goals: Education classes will be provided on a weekly basis, covering  required topics. Participant will state understanding/return demonstration of topics presented.  Learning Barriers/Preferences: Learning Barriers/Preferences - 12/24/17 1341      Learning Barriers/Preferences   Learning Barriers  None    Learning Preferences  Verbal Instruction;Skilled Demonstration;Group Instruction       Education Topics: How Lungs Work and Diseases: - Discuss the anatomy of the lungs and diseases that can affect the lungs, such as COPD.   PULMONARY REHAB CHRONIC OBSTRUCTIVE PULMONARY DISEASE from 05/23/2018 in [place]  Date  02/14/18  Educator  DC  Instruction Review Code  2- Demonstrated Understanding      Exercise: -Discuss the importance of exercise, FITT principles of exercise, normal and abnormal responses to exercise, and how to exercise safely.   Environmental Irritants: -Discuss types of environmental irritants and how to limit exposure to environmental irritants.   PULMONARY REHAB CHRONIC OBSTRUCTIVE PULMONARY DISEASE from 05/23/2018 in [place]  Date  05/23/18  Educator  D. Coad  Instruction Review Code  2- Demonstrated Understanding      Meds/Inhalers and oxygen: - Discuss respiratory medications, definition of an inhaler and oxygen, and the proper way to use an inhaler and oxygen.   PULMONARY REHAB CHRONIC OBSTRUCTIVE PULMONARY DISEASE from 05/23/2018 in [place]  Date  02/28/18  Educator  D. Wynetta Emery      Energy Saving Techniques: - Discuss methods to conserve energy and decrease shortness of breath when performing activities of daily living.    PULMONARY REHAB CHRONIC OBSTRUCTIVE PULMONARY DISEASE from 05/23/2018 in [place]  Date  03/07/18  Educator  D. Coad  Instruction Review Code  2- Demonstrated Understanding      Bronchial Hygiene / Breathing Techniques: - Discuss breathing mechanics, pursed-lip breathing technique,  proper  posture, effective ways to clear airways, and other functional breathing techniques   PULMONARY REHAB CHRONIC OBSTRUCTIVE PULMONARY DISEASE from 05/23/2018 in [place]  Date  03/14/18  Educator  D. Coad  Instruction Review Code  2- Demonstrated Understanding      Cleaning Equipment: - Provides group verbal and written instruction about the health risks of elevated stress, cause of high stress, and healthy ways to reduce stress.   PULMONARY REHAB CHRONIC OBSTRUCTIVE PULMONARY DISEASE from 05/23/2018 in [place]  Date  03/21/18  Educator  D. Coad  Instruction Review Code  2- Demonstrated Understanding      Nutrition I: Fats: - Discuss the types of cholesterol, what cholesterol does to the body, and how cholesterol levels can be controlled.   PULMONARY REHAB CHRONIC OBSTRUCTIVE PULMONARY DISEASE from 05/23/2018 in [place]  Date  03/28/18  Educator  D. Coad  Instruction Review Code  2- Demonstrated Understanding      Nutrition II: Labels: -Discuss the different components of food labels and how to read food labels.   PULMONARY REHAB CHRONIC OBSTRUCTIVE PULMONARY DISEASE from 05/23/2018 in [place]  Date  01/03/18  Educator  D. Coad  Instruction Review Code  2- Demonstrated Understanding      Respiratory Infections: - Discuss  the signs and symptoms of respiratory infections, ways to prevent respiratory infections, and the importance of seeking medical treatment when having a respiratory infection.   PULMONARY REHAB CHRONIC OBSTRUCTIVE PULMONARY DISEASE from 05/23/2018 in [place]  Date  04/18/18  Educator  DWynetta Emery  Instruction Review Code  2- Demonstrated Understanding      Stress I: Signs and Symptoms: - Discuss the causes of stress, how stress may lead to anxiety and depression, and ways to limit stress.   PULMONARY REHAB CHRONIC OBSTRUCTIVE  PULMONARY DISEASE from 05/23/2018 in [place]  Date  04/25/18  Educator  D.Coad  Instruction Review Code  2- Demonstrated Understanding      Stress II: Relaxation: -Discuss relaxation techniques to limit stress.   PULMONARY REHAB CHRONIC OBSTRUCTIVE PULMONARY DISEASE from 05/23/2018 in [place]  Date  01/24/18  Educator  D. Coad  Instruction Review Code  2- Demonstrated Understanding      Oxygen for Home/Travel: - Discuss how to prepare for travel when on oxygen and proper ways to transport and store oxygen to ensure safety.   PULMONARY REHAB CHRONIC OBSTRUCTIVE PULMONARY DISEASE from 05/23/2018 in [place]  Date  01/31/18  Educator  D. Coad  Instruction Review Code  2- Demonstrated Understanding      Knowledge Questionnaire Score: Knowledge Questionnaire Score - 06/13/18 0803      Knowledge Questionnaire Score   Pre Score  16/18    Post Score  17/18       Core Components/Risk Factors/Patient Goals at Admission: Personal Goals and Risk Factors at Admission - 12/24/17 1444      Core Components/Risk Factors/Patient Goals on Admission    Weight Management  Weight Maintenance    Personal Goal Other  Yes    Personal Goal  Build strength, breathe better    Intervention  Attend PR 2 x week and supplement at home exercise 3 x week.     Expected Outcomes  Achieve personal goals.        Core Components/Risk Factors/Patient Goals Review:  Goals and Risk Factor Review    Row Name 02/14/18 1253 03/07/18 0747 03/28/18 0748 04/18/18 0932 05/16/18 0810     Core Components/Risk Factors/Patient Goals Review   Personal Goals Review  Weight Management/Obesity;Develop more efficient breathing techniques such as purse lipped breathing and diaphragmatic breathing and practicing self-pacing with activity.;Improve shortness of breath with ADL's Build strength; breathe better; improve lung capacity.  Weight  Management/Obesity;Develop more efficient breathing techniques such as purse lipped breathing and diaphragmatic breathing and practicing self-pacing with activity.;Improve shortness of breath with ADL's Build strength; breathe better; improve lung capacity.   Weight Management/Obesity;Develop more efficient breathing techniques such as purse lipped breathing and diaphragmatic breathing and practicing self-pacing with activity.;Improve shortness of breath with ADL's Build strength; breathe better; improve lung capacity.  Weight Management/Obesity;Develop more efficient breathing techniques such as purse lipped breathing and diaphragmatic breathing and practicing self-pacing with activity.;Improve shortness of breath with ADL's Build strength; breathe better; improve lung capacity.   Weight Management/Obesity;Develop more efficient breathing techniques such as purse lipped breathing and diaphragmatic breathing and practicing self-pacing with activity.;Improve shortness of breath with ADL's Build strength; breathe better; improve lung capacity.    Review  Patient has completed 12 sessions gaining 3 lbs since he started the program. He is doing well in the program with progression. He says he does feel a little stronger and is breathing a little better. He hopes to  improve more as he continues the program but is pleased with his progress so far. Will continue to monitor for progress.   Patient has completed 15 sessions losing 1 lb since last 30 day review. He continues to do well in the program with progression. He says she program is helping him have less SOB. He continues to feel stronger and better overall. He does demonstrate proper pursed lip breathing in the sessions if needed. Will continue to monitor for progress.   Patient has completed 19 sessions losing 2 lbs since last 30 day review. He continues to do well in the program with progression. He continues to say the program is helping him. He feel stronger and  is able to work outside more with less SOB. He is able to use a chainsaw. He says he feels stronger and better overall. Will continue to monitor for progress.   Patient has completed 24 sessions losing 3 lbs since last 30 day review. He continues to do well in the program with progression. He continues to say he feels better overall since starting the program and has more energy and strength to do this around the house. Will continue to monitor for progress.   Patient has completed 30 sessions maintaining his weight since last 30 day review. He continues to do well in the program with progression. He continues to say the program is helping him breathe better and gain strength and energy. He is able to do more around the house both inside and outside. Will continue to monitor for progress.    Expected Outcomes  Patient will continue to attend sessions and complete the program meeting his personal goals.   Patient will continue to attend sessions and complete the program meeting his personal goals.   Patient will continue to attend sessions and complete the program meeting his personal goals.   Patient will continue to attend sessions and complete the program meeting his personal goals.   Patient will continue to attend sessions and complete the program meeting his personal goals.    [address] Name 06/13/18 0804             Core Components/Risk Factors/Patient Goals Review   Personal Goals Review  Weight Management/Obesity;Develop more efficient breathing techniques such as purse lipped breathing and diaphragmatic breathing and practicing self-pacing with activity.;Improve shortness of breath with ADL's Breathe better; get stronger.        Review  Patient graduated with 35 sessions losing 2 lbs overall. He did well in the program. He says he has more energy now and is stronger. He is able to do his ADL's without difficulty. His exit pulmonary assessment scores did improve. His exit walk test improved by 10.7% and  his exit measurements also improved in balance. He plans to continue exercising at home on his treadmill. PR will f/u for one year.        Expected Outcomes  Patient will continue exercising at home on his treadmill and continue to meet his personal goals.           Core Components/Risk Factors/Patient Goals at Discharge (Final Review):  Goals and Risk Factor Review - 06/13/18 0804      Core Components/Risk Factors/Patient Goals Review   Personal Goals Review  Weight Management/Obesity;Develop more efficient breathing techniques such as purse lipped breathing and diaphragmatic breathing and practicing self-pacing with activity.;Improve shortness of breath with ADL's   Breathe better; get stronger.    Review  Patient graduated with 35 sessions losing  2 lbs overall. He did well in the program. He says he has more energy now and is stronger. He is able to do his ADL's without difficulty. His exit pulmonary assessment scores did improve. His exit walk test improved by 10.7% and his exit measurements also improved in balance. He plans to continue exercising at home on his treadmill. PR will f/u for one year.     Expected Outcomes  Patient will continue exercising at home on his treadmill and continue to meet his personal goals.        ITP Comments: ITP Comments    Row Name 12/24/17 1355 01/16/18 1405         ITP Comments  Mr. Lobato has been in the program before.   Patient new to program. He has completed 4 sessions. Will continue to monitor for progress.          Comments: Patient graduated from Pulmonary Rehabilitation today on 01/15/12 after completing 35 sessions. He achieved LTG of 30 minutes of aerobic exercise at Max Met level of 3.03. All patients vitals are WNL. Discharge instruction has been reviewed in detail and patient stated an understanding of material given. Patient plans to exercise at home on his Treadmill. Pulmonary Rehab staff will make f/u calls at 1 month, 6 months, and 1  year. Patient had no complaints of any abnormal S/S or pain on their exit visit.

## 2018-06-13 NOTE — Progress Notes (Signed)
Discharge Progress Report  Patient Details  Name: Matthew Hall MRN: [id number] Date of Birth: [birth year] Referring Provider:     PULMONARY REHAB CHRONIC OBSTRUCTIVE PULMONARY DISEASE from 01/03/2018 in [place]  Referring Provider  Dr. Elsworth Soho  (Pended)        Number of Visits: 35  Reason for Discharge:  Patient reached a stable level of exercise. Patient independent in their exercise. Patient has met program and personal goals.  Smoking History:  Social History   Tobacco Use  Smoking Status Former Smoker  . Packs/day: 1.00  . Years: 30.00  . Pack years: 30.00  . Types: Cigarettes  . Last attempt to quit: 07/07/2011  . Years since quitting: 6.9  Smokeless Tobacco Never Used    Diagnosis:  Chronic obstructive pulmonary disease, unspecified COPD type ([place])  ADL UCSD: Pulmonary Assessment Scores    Row Name 12/24/17 1410 06/13/18 0759       ADL UCSD   ADL Phase  Entry  Exit    SOB Score total  21  12    Rest  0  0    Walk  4  3    Stairs  3  2    Bath  0  0    Dress  0  0    Shop  2  0      CAT Score   CAT Score  13  9      mMRC Score   mMRC Score  2  1       Initial Exercise Prescription: Initial Exercise Prescription - 01/08/18 0700      Date of Initial Exercise RX and Referring Provider   Date  12/24/17  (Pended)     Referring Provider  Dr. Elsworth Soho  (Pended)       Treadmill   MPH  1.3    Grade  0    Minutes  15    METs  1.9      NuStep   Level  1    Minutes  20      Prescription Details   Frequency (times per week)  2  (Pended)       Intensity   THRR 40-80% of Max Heartrate  [phone]  (Pended)     Ratings of Perceived Exertion  11-13  (Pended)     Perceived Dyspnea  0-4  (Pended)       Progression   Progression  Continue progressive overload as per policy without signs/symptoms or physical distress.  (Pended)       Resistance Training   Training Prescription  Yes    Weight  1    Reps  10-15        Discharge Exercise Prescription (Final Exercise Prescription Changes): Exercise Prescription Changes - 05/31/18 1400      Response to Exercise   Blood Pressure (Admit)  108/60    Blood Pressure (Exercise)  120/60    Blood Pressure (Exit)  116/68    Heart Rate (Admit)  71 bpm    Heart Rate (Exercise)  94 bpm    Heart Rate (Exit)  76 bpm    Oxygen Saturation (Admit)  96 %    Oxygen Saturation (Exercise)  90 %    Oxygen Saturation (Exit)  91 %    Rating of Perceived Exertion (Exercise)  12    Perceived Dyspnea (Exercise)  12    Duration  Progress to 30 minutes of  aerobic without signs/symptoms of physical  distress    Intensity  THRR unchanged      Progression   Progression  Continue to progress workloads to maintain intensity without signs/symptoms of physical distress.    Average METs  2.08      Resistance Training   Training Prescription  Yes    Weight  3    Reps  10-15    Time  5 Minutes      Treadmill   MPH  1.8    Grade  0    Minutes  22    METs  2.37      NuStep   Level  3    SPM  86    Minutes  17    METs  1.8      Home Exercise Plan   Plans to continue exercise at  Home (comment)    Frequency  Add 3 additional days to program exercise sessions.    Initial Home Exercises Provided  01/01/18       Functional Capacity: 6 Minute Walk    Row Name 12/24/17 1415 06/04/18 1143       6 Minute Walk   Phase  Initial  Discharge    Distance  1250 feet  1400 feet    Distance % Change  0 %  10.7 %    Distance Feet Change  0 ft  150 ft    Walk Time  6 minutes  6 minutes    # of Rest Breaks  0  0    MPH  2.36  2.65    METS  2.81  3.03    RPE  13  9    Perceived Dyspnea   11  7    VO2 Peak  10.17  10.96    Symptoms  Yes (comment)  No    Comments  Patient states that he felt a "funny" feeling in his head during walk test. Feeling went away after rest  -    Resting HR  101 bpm  69 bpm    Resting BP  98/62  118/60    Resting Oxygen Saturation   92 %  96 %     Exercise Oxygen Saturation  during 6 min walk  87 %  92 %    Max Ex. HR  112 bpm  84 bpm    Max Ex. BP  118/64  130/62    2 Minute Post BP  104/60  116/68       Psychological, QOL, Others - Outcomes: PHQ 2/9: Depression screen [facility] 2/9 06/13/2018 12/24/2017 05/21/2017 03/06/2017 02/20/2017  Decreased Interest 1 0 0 0 0  Down, Depressed, Hopeless 0 0 0 0 0  PHQ - 2 Score 1 0 0 0 0  Altered sleeping 1 1 - - 1  Tired, decreased energy 1 2 - - 1  Change in appetite 0 2 - - 1  Feeling bad or failure about yourself  0 0 - - 0  Trouble concentrating 0 0 - - 0  Moving slowly or fidgety/restless 0 0 - - 0  Suicidal thoughts 0 0 - - 0  PHQ-9 Score 3 5 - - 3  Difficult doing work/chores Not difficult at all Somewhat difficult - - Somewhat difficult    Quality of Life: Quality of Life - 06/04/18 1145      Quality of Life   Select  Quality of Life      Quality of Life Scores   Health/Function Pre  16.16 %  Health/Function Post  22.03 %    Health/Function % Change  36.32 %    Socioeconomic Pre  19.67 %    Socioeconomic Post  23.25 %    Socioeconomic % Change   18.2 %    Psych/Spiritual Pre  24.64 %    Psych/Spiritual Post  23.79 %    Psych/Spiritual % Change  -3.45 %    Family Pre  14.5 %    Family Post  23.7 %    Family % Change  63.45 %    GLOBAL Pre  18.28 %    GLOBAL Post  22.88 %    GLOBAL % Change  25.16 %       Personal Goals: Goals established at orientation with interventions provided to work toward goal. Personal Goals and Risk Factors at Admission - 12/24/17 1444      Core Components/Risk Factors/Patient Goals on Admission    Weight Management  Weight Maintenance    Personal Goal Other  Yes    Personal Goal  Build strength, breathe better    Intervention  Attend PR 2 x week and supplement at home exercise 3 x week.     Expected Outcomes  Achieve personal goals.         Personal Goals Discharge: Goals and Risk Factor Review    Row Name 02/14/18 1253 03/07/18  0747 03/28/18 0748 04/18/18 0932 05/16/18 0810     Core Components/Risk Factors/Patient Goals Review   Personal Goals Review  Weight Management/Obesity;Develop more efficient breathing techniques such as purse lipped breathing and diaphragmatic breathing and practicing self-pacing with activity.;Improve shortness of breath with ADL's Build strength; breathe better; improve lung capacity.  Weight Management/Obesity;Develop more efficient breathing techniques such as purse lipped breathing and diaphragmatic breathing and practicing self-pacing with activity.;Improve shortness of breath with ADL's Build strength; breathe better; improve lung capacity.   Weight Management/Obesity;Develop more efficient breathing techniques such as purse lipped breathing and diaphragmatic breathing and practicing self-pacing with activity.;Improve shortness of breath with ADL's Build strength; breathe better; improve lung capacity.  Weight Management/Obesity;Develop more efficient breathing techniques such as purse lipped breathing and diaphragmatic breathing and practicing self-pacing with activity.;Improve shortness of breath with ADL's Build strength; breathe better; improve lung capacity.   Weight Management/Obesity;Develop more efficient breathing techniques such as purse lipped breathing and diaphragmatic breathing and practicing self-pacing with activity.;Improve shortness of breath with ADL's Build strength; breathe better; improve lung capacity.    Review  Patient has completed 12 sessions gaining 3 lbs since he started the program. He is doing well in the program with progression. He says he does feel a little stronger and is breathing a little better. He hopes to improve more as he continues the program but is pleased with his progress so far. Will continue to monitor for progress.   Patient has completed 15 sessions losing 1 lb since last 30 day review. He continues to do well in the program with progression. He says she  program is helping him have less SOB. He continues to feel stronger and better overall. He does demonstrate proper pursed lip breathing in the sessions if needed. Will continue to monitor for progress.   Patient has completed 19 sessions losing 2 lbs since last 30 day review. He continues to do well in the program with progression. He continues to say the program is helping him. He feel stronger and is able to work outside more with less SOB. He is able to use a chainsaw. He says  he feels stronger and better overall. Will continue to monitor for progress.   Patient has completed 24 sessions losing 3 lbs since last 30 day review. He continues to do well in the program with progression. He continues to say he feels better overall since starting the program and has more energy and strength to do this around the house. Will continue to monitor for progress.   Patient has completed 30 sessions maintaining his weight since last 30 day review. He continues to do well in the program with progression. He continues to say the program is helping him breathe better and gain strength and energy. He is able to do more around the house both inside and outside. Will continue to monitor for progress.    Expected Outcomes  Patient will continue to attend sessions and complete the program meeting his personal goals.   Patient will continue to attend sessions and complete the program meeting his personal goals.   Patient will continue to attend sessions and complete the program meeting his personal goals.   Patient will continue to attend sessions and complete the program meeting his personal goals.   Patient will continue to attend sessions and complete the program meeting his personal goals.    [place] Name 06/13/18 0804             Core Components/Risk Factors/Patient Goals Review   Personal Goals Review  Weight Management/Obesity;Develop more efficient breathing techniques such as purse lipped breathing and diaphragmatic  breathing and practicing self-pacing with activity.;Improve shortness of breath with ADL's Breathe better; get stronger.        Review  Patient graduated with 35 sessions losing 2 lbs overall. He did well in the program. He says he has more energy now and is stronger. He is able to do his ADL's without difficulty. His exit pulmonary assessment scores did improve. His exit walk test improved by 10.7% and his exit measurements also improved in balance. He plans to continue exercising at home on his treadmill. PR will f/u for one year.        Expected Outcomes  Patient will continue exercising at home on his treadmill and continue to meet his personal goals.           Exercise Goals and Review: Exercise Goals    Row Name 12/24/17 1418             Exercise Goals   Increase Physical Activity  Yes       Intervention  Provide advice, education, support and counseling about physical activity/exercise needs.;Develop an individualized exercise prescription for aerobic and resistive training based on initial evaluation findings, risk stratification, comorbidities and participant's personal goals.       Expected Outcomes  Short Term: Attend rehab on a regular basis to increase amount of physical activity.       Increase Strength and Stamina  Yes       Intervention  Provide advice, education, support and counseling about physical activity/exercise needs.;Develop an individualized exercise prescription for aerobic and resistive training based on initial evaluation findings, risk stratification, comorbidities and participant's personal goals.       Expected Outcomes  Short Term: Increase workloads from initial exercise prescription for resistance, speed, and METs.       Able to understand and use rate of perceived exertion (RPE) scale  Yes       Intervention  Provide education and explanation on how to use RPE scale       Expected  Outcomes  Long Term:  Able to use RPE to guide intensity level when exercising  independently;Short Term: Able to use RPE daily in rehab to express subjective intensity level       Able to understand and use Dyspnea scale  Yes       Intervention  Provide education and explanation on how to use Dyspnea scale       Expected Outcomes  Short Term: Able to use Dyspnea scale daily in rehab to express subjective sense of shortness of breath during exertion;Long Term: Able to use Dyspnea scale to guide intensity level when exercising independently       Knowledge and understanding of Target Heart Rate Range (THRR)  Yes       Intervention  Provide education and explanation of THRR including how the numbers were predicted and where they are located for reference       Expected Outcomes  Short Term: Able to state/look up THRR;Long Term: Able to use THRR to govern intensity when exercising independently;Short Term: Able to use daily as guideline for intensity in rehab       Able to check pulse independently  Yes       Intervention  Provide education and demonstration on how to check pulse in carotid and radial arteries.;Review the importance of being able to check your own pulse for safety during independent exercise       Expected Outcomes  Short Term: Able to explain why pulse checking is important during independent exercise;Long Term: Able to check pulse independently and accurately       Understanding of Exercise Prescription  Yes       Intervention  Provide education, explanation, and written materials on patient's individual exercise prescription       Expected Outcomes  Short Term: Able to explain program exercise prescription;Long Term: Able to explain home exercise prescription to exercise independently          Exercise Goals Re-Evaluation: Exercise Goals Re-Evaluation    Row Name 01/14/18 1437 02/13/18 1840 03/04/18 0747 03/15/18 1417 03/26/18 0823     Exercise Goal Re-Evaluation   Exercise Goals Review  Increase Physical Activity;Increase Strength and Stamina;Able to  understand and use rate of perceived exertion (RPE) scale;Improve claudication pain tolerance and improve walking ability;Knowledge and understanding of Target Heart Rate Range (THRR);Able to understand and use Dyspnea scale;Understanding of Exercise Prescription  Increase Strength and Stamina;Able to understand and use Dyspnea scale;Knowledge and understanding of Target Heart Rate Range (THRR);Able to understand and use rate of perceived exertion (RPE) scale;Understanding of Exercise Prescription  Increase Strength and Stamina;Able to understand and use rate of perceived exertion (RPE) scale;Able to understand and use Dyspnea scale;Understanding of Exercise Prescription  Increase Strength and Stamina;Able to understand and use rate of perceived exertion (RPE) scale;Able to understand and use Dyspnea scale;Understanding of Exercise Prescription  Increase Strength and Stamina;Able to understand and use rate of perceived exertion (RPE) scale;Able to understand and use Dyspnea scale;Understanding of Exercise Prescription   Comments  Patient has only been to PR for 3 sessions so far. Patient will be progressed in time.   Patient has been able to tolerate increases with wieghts and equipment workloads. He feels he is getting stronger. He also feels he is breathing better. 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 this has not affected his ability to exercise.   Patient has been able to tolerate increases with weight and equipment workloads. he is getting stronger.  We have a hard to getting his SaO2 when on TM. Overall he is doing well.   Patient is doing well in rehab. He has had 18 sessions so far and is tolerating progressions. He has increased his workload on the Nu Step to level 3.   Patient is doing well in rehab. He has had 18 sessions so far and is tolerating progressions. He has increased his workload on the Nu Step to level 3. Increased weights to 3 lbs.    Expected Outcomes  Patient  wishes to increase strength and to be able to breathe better   To cintinue to build strength and his breathing to continue to improve.   To cintinue to build strength and his breathing to continue to improve.   Continue to build strength and improve his breathing.   Continue to build strength and improve his breathing.    [place] Name 04/16/18 1013 05/15/18 1453 06/04/18 0833         Exercise Goal Re-Evaluation   Exercise Goals Review  Increase Strength and Stamina;Able to understand and use rate of perceived exertion (RPE) scale;Able to understand and use Dyspnea scale;Understanding of Exercise Prescription  Increase Strength and Stamina;Able to understand and use rate of perceived exertion (RPE) scale;Able to understand and use Dyspnea scale;Understanding of Exercise Prescription  Increase Strength and Stamina;Able to understand and use rate of perceived exertion (RPE) scale;Able to understand and use Dyspnea scale;Understanding of Exercise Prescription;Increase Physical Activity     Comments  Patient is continuing to progress well in the program. He is up to 1.8 mph on the treadmill. Will progress to 4lb weights if able.   Patient has done well in rehab. He has reached a point where he can't increase resistance on the machines but he pushes to increase his workload. We will continue to monitor his progress.   Patient has done well in rehab. He has reached a point where he can't increase resistance on the machines but he continues to push himself on the nustep to increase SPM. We will continue to monitor his progress, he will graduate in two sessions.      Expected Outcomes  Build strength and continue to improve SOB.   Build strength and continue to improve SOB.   Build strength and continue to improve SOB.         Nutrition & Weight - Outcomes: Pre Biometrics - 12/24/17 1419      Pre Biometrics   Height  _0  (1.803 m)    Weight  80.4 kg    Waist Circumference  40 inches    Hip Circumference  37  inches    Waist to Hip Ratio  1.08 %    BMI (Calculated)  24.73    Triceps Skinfold  16 mm    % Body Fat  26.7 %    Grip Strength  79.33 kg    Flexibility  0 in    Single Leg Stand  3 seconds      Post Biometrics - 06/04/18 1144       Post  Biometrics   Height  _1  (1.803 m)    Weight  79 kg    Waist Circumference  38.5 inches    Hip Circumference  38 inches    Waist to Hip Ratio  1.01 %    BMI (Calculated)  24.3    Triceps Skinfold  14 mm    % Body Fat  25.3 %    Grip Strength  32 kg    Flexibility  0 in    Single Leg Stand  4 seconds       Nutrition: Nutrition Therapy & Goals - 05/16/18 0810      Nutrition Therapy   RD appointment deferred  Yes      Personal Nutrition Goals   Nutrition Goal  RD appointment deferred. Patient not trying to change his diet.     Personal Goal #2  Patient is eating a regular diet. We discussed ways to make changes. Handouts given.     Additional Goals?  No       Nutrition Discharge: Nutrition Assessments - 06/13/18 0802      MEDFICTS Scores   Pre Score  48    Post Score  15    Score Difference  -33       Education Questionnaire Score: Knowledge Questionnaire Score - 06/13/18 0803      Knowledge Questionnaire Score   Pre Score  16/18    Post Score  17/18       Goals reviewed with patient; copy given to patient.

## 2018-07-31 ENCOUNTER — Telehealth: Payer: Self-pay | Admitting: Pulmonary Disease

## 2018-07-31 NOTE — Telephone Encounter (Signed)
Called and spoke with Patient. He stated that his insurance would not cover proventil.  Recommended inhaler is Proair. Patient is requesting anything sent to [facility].  Will route to Dr. Elsworth Soho

## 2018-07-31 NOTE — Telephone Encounter (Signed)
I always send prescription for generic albuterol-please do so That way if  insurance changes brand, it does not matter

## 2018-08-01 MED ORDER — ALBUTEROL SULFATE HFA 108 (90 BASE) MCG/ACT IN AERS: 2.0000 | INHALATION_SPRAY | Freq: Four times a day (QID) | RESPIRATORY_TRACT | 2 refills | Status: DC | PRN

## 2018-08-01 MED ORDER — ALBUTEROL SULFATE 108 (90 BASE) MCG/ACT IN AEPB: 2.0000 | INHALATION_SPRAY | Freq: Four times a day (QID) | RESPIRATORY_TRACT | 3 refills | Status: DC | PRN

## 2018-08-01 NOTE — Telephone Encounter (Signed)
I have sent in the generic albuterol into the pharmacy. Patient is aware and nothing further is needed at this time.

## 2018-08-09 ENCOUNTER — Ambulatory Visit: Payer: Medicare Other | Admitting: Cardiology

## 2018-08-09 ENCOUNTER — Encounter: Payer: Self-pay | Admitting: Cardiology

## 2018-08-09 VITALS — BP 145/77 | HR 69 | Ht 71.0 in | Wt 174.6 lb

## 2018-08-09 DIAGNOSIS — I10 Essential (primary) hypertension: Secondary | ICD-10-CM | POA: Diagnosis not present

## 2018-08-09 DIAGNOSIS — I4821 Permanent atrial fibrillation: Secondary | ICD-10-CM | POA: Diagnosis not present

## 2018-08-09 DIAGNOSIS — J449 Chronic obstructive pulmonary disease, unspecified: Secondary | ICD-10-CM

## 2018-08-09 NOTE — Progress Notes (Signed)
Cardiology Office Note  Date: 08/09/2018   ID: Matthew Hall, DOB [birth year], MRN [id number]  PCP: [name], MD  Primary Cardiologist: Rozann Lesches, MD   Chief Complaint  Patient presents with  . Atrial Fibrillation    History of Present Illness: Matthew Hall is a 73 y.o. male last seen in June 2019.  He is here for a routine visit.  He does not report any palpitations or chest pain.  He completed cardiac/pulmonary rehabilitation and plans to do the maintenance program.  He is on Coumadin for stroke prophylaxis with CHADSVASC score of 3, INR followed by PCP.  He does not report any spontaneous bleeding problems.  I personally reviewed his ECG today which shows rate controlled atrial fibrillation with IVCD and left anterior fascicular block.  He remains on Toprol-XL for heart rate control.  Past Medical History:  Diagnosis Date  . Anxiety   . Aortic atherosclerosis ([place])   . Atrial fibrillation ([place])   . Basal cell carcinoma   . Bladder cancer ([place])   . Bladder tumor   . Colon polyps   . COPD (chronic obstructive pulmonary disease) ([place])   . Cutaneous vasculitis   . Diverticulosis   . Essential hypertension   . GERD (gastroesophageal reflux disease)   . History of pneumonia   . Nephrolithiasis   . Osteoarthritis     Past Surgical History:  Procedure Laterality Date  . COLONOSCOPY    . Kidney stones removed  1996  . SKIN CANCER EXCISION Left    2003  . TRANSURETHRAL RESECTION OF BLADDER TUMOR  1996    Current Outpatient Medications  Medication Sig Dispense Refill  . Albuterol Sulfate 108 (90 Base) MCG/ACT AEPB Inhale 2 puffs into the lungs every 6 (six) hours as needed. 1 each 3  . ALPRAZolam (XANAX) 0.25 MG tablet Take 0.25 mg by mouth 2 (two) times daily as needed for anxiety or sleep.   3  . budesonide-formoterol (SYMBICORT) 160-4.5 MCG/ACT inhaler Inhale 2 puffs into the lungs 2 (two) times daily. 1 Inhaler 6  . cetirizine (ZYRTEC) 5 MG  tablet Take 5 mg by mouth daily.    . D3-50 50000 units capsule Take 50,000 Units by mouth once a week. Usually Tuesday or Wednesday.  0  . erythromycin ophthalmic ointment Place 1 application into both eyes daily as needed (Infection).    . hydroxypropyl methylcellulose / hypromellose (ISOPTO TEARS / GONIOVISC) 2.5 % ophthalmic solution Place 1 drop into both eyes as needed for dry eyes.    Marland Kitchen levalbuterol (XOPENEX) 0.63 MG/3ML nebulizer solution Take 3 mLs (0.63 mg total) by nebulization every 6 (six) hours as needed for wheezing or shortness of breath. 75 mL 6  . losartan-hydrochlorothiazide (HYZAAR) 50-12.5 MG tablet Take 0.5 tablets by mouth 2 (two) times daily.     . metoprolol succinate (TOPROL-XL) 50 MG 24 hr tablet Take 1 tablet (50 mg total) by mouth 2 (two) times daily. 60 tablet 0  . Multiple Vitamin (MULTIVITAMIN WITH MINERALS) TABS tablet Take 1 tablet by mouth daily.    Marland Kitchen omeprazole (PRILOSEC) 40 MG capsule Take 40 mg by mouth daily.    Marland Kitchen triamcinolone (NASACORT) 55 MCG/ACT AERO nasal inhaler Place 1 spray into the nose daily as needed (Congestion).     . warfarin (COUMADIN) 2.5 MG tablet Take 2.5 mg by mouth daily.     No current facility-administered medications for this visit.    Allergies:  Simvastatin; Albuterol; Augmentin [amoxicillin-pot clavulanate]; Ciprofloxacin;  Contrast media [iodinated diagnostic agents]; Flagyl [metronidazole]; Latex; and Sulfa antibiotics   Social History: The patient  reports that he quit smoking about 7 years ago. His smoking use included cigarettes. He has a 30.00 pack-year smoking history. He has never used smokeless tobacco. He reports that he does not drink alcohol or use drugs.   ROS:  Please see the history of present illness. Otherwise, complete review of systems is positive for stable shortness of breath.  All other systems are reviewed and negative.   Physical Exam: VS:  BP (!) 145/77   Pulse 69   Ht 5\' 11"  (1.803 m)   Wt 174 lb 9.6 oz  (79.2 kg)   SpO2 97%   BMI 24.35 kg/m , BMI Body mass index is 24.35 kg/m.  Wt Readings from Last 3 Encounters:  08/09/18 174 lb 9.6 oz (79.2 kg)  06/04/18 174 lb 2.6 oz (79 kg)  02/20/18 180 lb (81.6 kg)    General: Patient appears comfortable at rest. HEENT: Conjunctiva and lids normal, oropharynx clear. Neck: Supple, no elevated JVP or carotid bruits, no thyromegaly. Lungs: Decreased breath sounds without wheezing, nonlabored breathing at rest. Cardiac: Irregularly irregular, distant, no S3 or significant systolic murmur. Abdomen: Soft, nontender, bowel sounds present. Extremities: No pitting edema, distal pulses 1-2+. Skin: Warm and dry. Musculoskeletal: No kyphosis. Neuropsychiatric: Alert and oriented x3, affect grossly appropriate.  ECG: I personally reviewed the tracing from 08/09/2017 which showed atrial fibrillation with IVCD of left bundle branch block type and nonspecific ST changes.  Recent Labwork: 08/11/2017: Hemoglobin 12.9; Platelets 379  December 2019: Hemoglobin A1c 6.6, cholesterol 155, triglycerides 75, HDL 45, LDL 95  Other Studies Reviewed Today:  Echocardiogram 07/09/2016: Study Conclusions  - Left ventricle: The cavity size was normal. Wall thickness was increased in a pattern of mild LVH. Systolic function was vigorous. The estimated ejection fraction was in the range of 65% to 70%. Wall motion was normal; there were no regional wall motion abnormalities. The study is not technically sufficient to allow evaluation of LV diastolic function. - Mitral valve: Calcified annulus. - Left atrium: The atrium was moderately dilated. - Right atrium: The atrium was moderately dilated.  Assessment and Plan:  1.  Permanent atrial fibrillation with chads Vascor of 3.  He is doing well in terms of symptoms, tolerating Toprol-XL and Coumadin with follow-up INR per PCP.  He has not wanted to transition to a DOAC.  2.  Essential hypertension, he tracks  blood pressure with a home monitor.  No changes made to current regimen.  Keep follow-up with PCP.  3.  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  4.  COPD, followed by Pulmonary.  Current medicines were reviewed with the patient today.   Orders Placed This Encounter  Procedures  . EKG 12-Lead    Disposition: Follow-up in 6 months.  Signed, Satira Sark, MD, [facility] 08/09/2018 2:46 PM    [facility] [place], [place], [place] [address] Phone: [phone]; Fax: [phone]

## 2018-08-09 NOTE — Patient Instructions (Addendum)

## 2018-10-03 ENCOUNTER — Telehealth: Payer: Self-pay | Admitting: Pulmonary Disease

## 2018-10-03 DIAGNOSIS — J9601 Acute respiratory failure with hypoxia: Secondary | ICD-10-CM

## 2018-10-03 DIAGNOSIS — J441 Chronic obstructive pulmonary disease with (acute) exacerbation: Secondary | ICD-10-CM

## 2018-10-03 DIAGNOSIS — J479 Bronchiectasis, uncomplicated: Secondary | ICD-10-CM

## 2018-10-03 NOTE — Telephone Encounter (Signed)
Called [facility] in [place] this pt does not even have 02 and wilkl need to be qualified to get it plus they will do his neb and meds if you want to send the order to them

## 2018-10-03 NOTE — Telephone Encounter (Signed)
Called and spoke with patient he is asking for a prescription for a POC. Patient has not been seen since 2018.   RA please advise, thank you.

## 2018-10-03 NOTE — Telephone Encounter (Signed)
Okay to send prescription. DME can assess him, please arrange for follow-up visit in 2 months

## 2018-10-03 NOTE — Telephone Encounter (Signed)
Spoke with pt and he requested the order be sent to [facility].  Phone [phone] and fax [phone].  He also requested neb supplies.  Order sent.  Nothing further needed.

## 2018-10-04 ENCOUNTER — Telehealth: Payer: Self-pay | Admitting: Pulmonary Disease

## 2018-10-04 DIAGNOSIS — J438 Other emphysema: Secondary | ICD-10-CM

## 2018-10-04 NOTE — Telephone Encounter (Signed)
Please see telephone encounter from 10/04/2018.  Spoke with pt. States that he would like for this order to be sent to [facility] instead. Pt is wanting a home concentrator and NOT a POC. Order will be placed. Nothing further was needed.

## 2018-10-08 ENCOUNTER — Telehealth: Payer: Self-pay | Admitting: Pulmonary Disease

## 2018-10-08 NOTE — Telephone Encounter (Signed)
Patient's demographics and last OV notes have been faxed to [facility] at the given fax number. Nothing further needed.

## 2018-10-10 ENCOUNTER — Telehealth: Payer: Self-pay | Admitting: Pulmonary Disease

## 2018-10-10 MED ORDER — LEVALBUTEROL HCL 0.63 MG/3ML IN NEBU: 0.6300 mg | INHALATION_SOLUTION | Freq: Four times a day (QID) | RESPIRATORY_TRACT | 6 refills | Status: DC | PRN

## 2018-10-10 NOTE — Telephone Encounter (Signed)
Returned call to patient. He would like levalbuterol rx faxed to [facility] in [place] fax # [phone]. Informed patient that we have not seen in clinic and office visit may be needed. Can levalbuterol be authorized for refill?  Last ov 06/21/17 with Eric Form.

## 2018-10-11 MED ORDER — LEVALBUTEROL HCL 0.63 MG/3ML IN NEBU: 0.6300 mg | INHALATION_SOLUTION | Freq: Four times a day (QID) | RESPIRATORY_TRACT | 6 refills | Status: DC | PRN

## 2018-10-11 NOTE — Telephone Encounter (Signed)
Yes - that would be fine

## 2018-10-11 NOTE — Telephone Encounter (Signed)
Pt made aware rx sent.

## 2018-10-11 NOTE — Addendum Note (Signed)
Addended by: Stephanie Coup on: 10/11/2018 09:28 AM   Modules accepted: Orders

## 2018-10-11 NOTE — Telephone Encounter (Signed)
Ok to refill 

## 2018-10-11 NOTE — Addendum Note (Signed)
Addended by: Stephanie Coup on: 10/11/2018 09:48 AM   Modules accepted: Orders

## 2018-10-11 NOTE — Telephone Encounter (Signed)
Tonya in [place] absence today would you be willing to sign a printed levalbuterol rx for this patient?

## 2018-10-11 NOTE — Telephone Encounter (Addendum)
rx printed and given to TN to sign Will fax to pharmacy [facility]. Nothing further needed.

## 2019-01-22 ENCOUNTER — Encounter: Payer: Self-pay | Admitting: Cardiology

## 2019-04-03 ENCOUNTER — Telehealth: Payer: Self-pay | Admitting: Cardiology

## 2019-04-03 NOTE — Telephone Encounter (Signed)
Virtual Visit Pre-Appointment Phone Call  "(Name), I am calling you today to discuss your upcoming appointment. We are currently trying to limit exposure to the virus that causes COVID-19 by seeing patients at home rather than in the office."  1. "What is the BEST phone number to call the day of the visit?" - include this in appointment notes  2. Do you have or have access to (through a family member/friend) a smartphone with video capability that we can use for your visit?" a. If yes - list this number in appt notes as cell (if different from BEST phone #) and list the appointment type as a VIDEO visit in appointment notes b. If no - list the appointment type as a PHONE visit in appointment notes  3. Confirm consent - "In the setting of the current Covid19 crisis, you are scheduled for a (phone or video) visit with your provider on (date) at (time).  Just as we do with many in-office visits, in order for you to participate in this visit, we must obtain consent.  If you'd like, I can send this to your mychart (if signed up) or email for you to review.  Otherwise, I can obtain your verbal consent now.  All virtual visits are billed to your insurance company just like a normal visit would be.  By agreeing to a virtual visit, we'd like you to understand that the technology does not allow for your provider to perform an examination, and thus may limit your provider's ability to fully assess your condition. If your provider identifies any concerns that need to be evaluated in person, we will make arrangements to do so.  Finally, though the technology is pretty good, we cannot assure that it will always work on either your or our end, and in the setting of a video visit, we may have to convert it to a phone-only visit.  In either situation, we cannot ensure that we have a secure connection.  Are you willing to proceed?" STAFF: Did the patient verbally acknowledge consent to telehealth visit? Document  YES/NO here: yes  4. Advise patient to be prepared - "Two hours prior to your appointment, go ahead and check your blood pressure, pulse, oxygen saturation, and your weight (if you have the equipment to check those) and write them all down. When your visit starts, your provider will ask you for this information. If you have an Apple Watch or Kardia device, please plan to have heart rate information ready on the day of your appointment. Please have a pen and paper handy nearby the day of the visit as well."  5. Give patient instructions for MyChart download to smartphone OR Doximity/[email or web address] as below if video visit (depending on what platform provider is using)  6. Inform patient they will receive a phone call 15 minutes prior to their appointment time (may be from unknown caller ID) so they should be prepared to answer    TELEPHONE CALL NOTE  Matthew Hall has been deemed a candidate for a follow-up tele-health visit to limit community exposure during the Covid-19 pandemic. I spoke with the patient via phone to ensure availability of phone/video source, confirm preferred email & phone number, and discuss instructions and expectations.  I reminded Matthew Hall to be prepared with any vital sign and/or heart rhythm information that could potentially be obtained via home monitoring, at the time of his visit. I reminded Matthew Hall to expect a phone call prior to  his visit.  Matthew Hall 04/03/2019 11:12 AM   INSTRUCTIONS FOR DOWNLOADING THE MYCHART APP TO SMARTPHONE  - The patient must first make sure to have activated MyChart and know their login information - If Apple, go to [employer] and type in MyChart in the search bar and download the app. If Android, ask patient to go to [employer] and type in [place] in the search bar and download the app. The app is free but as with any other app downloads, their phone may require them to verify saved payment information or  Apple/Android password.  - The patient will need to then log into the app with their MyChart username and password, and select [facility] as their healthcare provider to link the account. When it is time for your visit, go to the MyChart app, find appointments, and click Begin Video Visit. Be sure to Select Allow for your device to access the Microphone and Camera for your visit. You will then be connected, and your provider will be with you shortly.  **If they have any issues connecting, or need assistance please contact MyChart service desk [phone])**  **If using a computer, in order to ensure the best quality for their visit they will need to use either of the following Internet Browsers: [employer], or Google Chrome**  IF USING DOXIMITY or DOXY.ME - The patient will receive a link just prior to their visit by text.     FULL LENGTH CONSENT FOR TELE-HEALTH VISIT   I hereby voluntarily request, consent and authorize [facility] and its employed or contracted physicians, physician assistants, nurse practitioners or other licensed health care professionals (the Practitioner), to provide me with telemedicine health care services (the Services") as deemed necessary by the treating Practitioner. I acknowledge and consent to receive the Services by the Practitioner via telemedicine. I understand that the telemedicine visit will involve communicating with the Practitioner through live audiovisual communication technology and the disclosure of certain medical information by electronic transmission. I acknowledge that I have been given the opportunity to request an in-person assessment or other available alternative prior to the telemedicine visit and am voluntarily participating in the telemedicine visit.  I understand that I have the right to withhold or withdraw my consent to the use of telemedicine in the course of my care at any time, without affecting my right to future care  or treatment, and that the Practitioner or I may terminate the telemedicine visit at any time. I understand that I have the right to inspect all information obtained and/or recorded in the course of the telemedicine visit and may receive copies of available information for a reasonable fee.  I understand that some of the potential risks of receiving the Services via telemedicine include:   Delay or interruption in medical evaluation due to technological equipment failure or disruption;  Information transmitted may not be sufficient (e.g. poor resolution of images) to allow for appropriate medical decision making by the Practitioner; and/or   In rare instances, security protocols could fail, causing a breach of personal health information.  Furthermore, I acknowledge that it is my responsibility to provide information about my medical history, conditions and care that is complete and accurate to the best of my ability. I acknowledge that Practitioner's advice, recommendations, and/or decision may be based on factors not within their control, such as incomplete or inaccurate data provided by me or distortions of diagnostic images or specimens that may result from electronic transmissions. I  understand that the practice of medicine is not an exact science and that Practitioner makes no warranties or guarantees regarding treatment outcomes. I acknowledge that I will receive a copy of this consent concurrently upon execution via email to the email address I last provided but may also request a printed copy by calling the office of [facility].    I understand that my insurance will be billed for this visit.   I have read or had this consent read to me.  I understand the contents of this consent, which adequately explains the benefits and risks of the Services being provided via telemedicine.   I have been provided ample opportunity to ask questions regarding this consent and the Services and have had  my questions answered to my satisfaction.  I give my informed consent for the services to be provided through the use of telemedicine in my medical care  By participating in this telemedicine visit I agree to the above.

## 2019-04-16 ENCOUNTER — Telehealth (INDEPENDENT_AMBULATORY_CARE_PROVIDER_SITE_OTHER): Payer: Medicare Other | Admitting: Cardiology

## 2019-04-16 ENCOUNTER — Encounter: Payer: Self-pay | Admitting: Cardiology

## 2019-04-16 VITALS — BP 132/84 | HR 78 | Ht 71.0 in | Wt 174.0 lb

## 2019-04-16 DIAGNOSIS — N529 Male erectile dysfunction, unspecified: Secondary | ICD-10-CM

## 2019-04-16 DIAGNOSIS — J449 Chronic obstructive pulmonary disease, unspecified: Secondary | ICD-10-CM

## 2019-04-16 DIAGNOSIS — I10 Essential (primary) hypertension: Secondary | ICD-10-CM | POA: Diagnosis not present

## 2019-04-16 DIAGNOSIS — I4821 Permanent atrial fibrillation: Secondary | ICD-10-CM | POA: Diagnosis not present

## 2019-04-16 NOTE — Patient Instructions (Addendum)

## 2019-04-16 NOTE — Progress Notes (Signed)
Virtual Visit via Telephone Note   This visit type was conducted due to national recommendations for restrictions regarding the COVID-19 Pandemic (e.g. social distancing) in an effort to limit this patient's exposure and mitigate transmission in our community.  Due to his co-morbid illnesses, this patient is at least at moderate risk for complications without adequate follow up.  This format is felt to be most appropriate for this patient at this time.  The patient did not have access to video technology/had technical difficulties with video requiring transitioning to audio format only (telephone).  All issues noted in this document were discussed and addressed.  No physical exam could be performed with this format.  Please refer to the patient's chart for his  consent to telehealth for [facility].   Date:  04/16/2019   ID:  Matthew Hall, DOB 06/23/1946, MRN [id number]  Patient Location: Home Provider Location: Office  PCP:  [name], MD  Cardiologist:  Rozann Lesches, MD Electrophysiologist:  None   Evaluation Performed:  Follow-Up Visit  Chief Complaint:   Cardiac follow-up  History of Present Illness:    Matthew Hall is a 73 y.o. male last seen in January 2020.  We spoke by phone today.  He does not report any exertional angina or palpitations.  He states that he has been compliant with his medications including Toprol-XL and Coumadin.  He continues on Coumadin with follow-up by PCP.  Recent INR was 2.6.  He does not report any bleeding problems.  He also reports problems with erectile dysfunction, has a urologist in [place], Dr. Delrae Alfred.  States that he tried Cialis previously but had headache with that.  Has tried sildenafil as well and tolerated that better.  He does not have any specific cardiac contraindications for either medication, I told him that I would forward this information to his urologist so that they could come up with a treatment  plan.  I reviewed interval lab work from July as outlined below.  The patient does not have symptoms concerning for COVID-19 infection (fever, chills, cough, or new shortness of breath).    Past Medical History:  Diagnosis Date  . Anxiety   . Aortic atherosclerosis ([place])   . Atrial fibrillation ([place])   . Basal cell carcinoma   . Bladder cancer ([place])   . Bladder tumor   . Colon polyps   . COPD (chronic obstructive pulmonary disease) ([place])   . Cutaneous vasculitis   . Diverticulosis   . Essential hypertension   . GERD (gastroesophageal reflux disease)   . History of pneumonia   . Nephrolithiasis   . Osteoarthritis    Past Surgical History:  Procedure Laterality Date  . COLONOSCOPY    . Kidney stones removed  1996  . SKIN CANCER EXCISION Left    2003  . TRANSURETHRAL RESECTION OF BLADDER TUMOR  1996     Current Meds  Medication Sig  . Albuterol Sulfate 108 (90 Base) MCG/ACT AEPB Inhale 2 puffs into the lungs every 6 (six) hours as needed.  . ALPRAZolam (XANAX) 0.25 MG tablet Take 0.25 mg by mouth 2 (two) times daily as needed for anxiety or sleep.   . budesonide-formoterol (SYMBICORT) 160-4.5 MCG/ACT inhaler Inhale 2 puffs into the lungs 2 (two) times daily.  . cetirizine (ZYRTEC) 5 MG tablet Take 5 mg by mouth daily.  . D3-50 50000 units capsule Take 50,000 Units by mouth once a week. Usually Tuesday or Wednesday.  . erythromycin ophthalmic ointment Place  1 application into both eyes daily as needed (Infection).  . hydroxypropyl methylcellulose / hypromellose (ISOPTO TEARS / GONIOVISC) 2.5 % ophthalmic solution Place 1 drop into both eyes as needed for dry eyes.  Marland Kitchen levalbuterol (XOPENEX) 0.63 MG/3ML nebulizer solution Take 3 mLs (0.63 mg total) by nebulization every 6 (six) hours as needed for wheezing or shortness of breath.  . losartan-hydrochlorothiazide (HYZAAR) 50-12.5 MG tablet Take 1 tablet by mouth daily.   . metoprolol succinate (TOPROL-XL) 50 MG 24 hr tablet Take  1 tablet (50 mg total) by mouth 2 (two) times daily.  . Multiple Vitamin (MULTIVITAMIN WITH MINERALS) TABS tablet Take 1 tablet by mouth daily.  Marland Kitchen omeprazole (PRILOSEC) 40 MG capsule Take 40 mg by mouth daily.  Marland Kitchen triamcinolone (NASACORT) 55 MCG/ACT AERO nasal inhaler Place 1 spray into the nose daily as needed (Congestion).   . warfarin (COUMADIN) 2.5 MG tablet Take 2.5 mg by mouth daily.     Allergies:   Simvastatin, Albuterol, Augmentin [amoxicillin-pot clavulanate], Ciprofloxacin, Contrast media [iodinated diagnostic agents], Flagyl [metronidazole], Latex, and Sulfa antibiotics   Social History   Tobacco Use  . Smoking status: Former Smoker    Packs/day: 1.00    Years: 30.00    Pack years: 30.00    Types: Cigarettes    Quit date: 07/07/2011    Years since quitting: 7.7  . Smokeless tobacco: Never Used  Substance Use Topics  . Alcohol use: No    Alcohol/week: 0.0 standard drinks  . Drug use: No     Family Hx: The patient's family history includes CAD in his mother; Diabetes in his mother and sister; Emphysema in his father; Liver cancer in his brother; Lung cancer in his brother; Stroke in his sister. There is no history of Colon cancer.  ROS:   Please see the history of present illness. Arthritic pains. All other systems reviewed and are negative.   Prior CV studies:   The following studies were reviewed today:  Echocardiogram 07/09/2016: Study Conclusions  - Left ventricle: The cavity size was normal. Wall thickness was increased in a pattern of mild LVH. Systolic function was vigorous. The estimated ejection fraction was in the range of 65% to 70%. Wall motion was normal; there were no regional wall motion abnormalities. The study is not technically sufficient to allow evaluation of LV diastolic function. - Mitral valve: Calcified annulus. - Left atrium: The atrium was moderately dilated. - Right atrium: The atrium was moderately dilated.  Labs/Other  Tests and Data Reviewed:    EKG:  An ECG dated 08/09/2018 was personally reviewed today and demonstrated:  Rate controlled atrial fibrillation with IVCD, left anterior fascicular block.  Recent Labs:  July 2020: BUN 16, creatinine 1.02, potassium 4.0, AST 24, ALT 24, hemoglobin 15.3, platelets 318, cholesterol 170, triglycerides 78, HDL 50, LDL 104, hemoglobin A1c 6.4%  Wt Readings from Last 3 Encounters:  04/16/19 174 lb (78.9 kg)  08/09/18 174 lb 9.6 oz (79.2 kg)  06/04/18 174 lb 2.6 oz (79 kg)     Objective:    Vital Signs:  BP 132/84   Pulse 78   Ht 5\' 11"  (1.803 m)   Wt 174 lb (78.9 kg)   SpO2 97%   BMI 24.27 kg/m    Patient spoke in full sentences, not short of breath. No audible wheezing or coughing. Speech pattern normal.  ASSESSMENT & PLAN:    1.  Permanent atrial fibrillation with CHADSVASC score of 3.  He continues on Coumadin, reports no  bleeding problems.  Also tolerating Toprol-XL with good heart rate control.  2.  Essential hypertension, systolics in the [id number] today.  Keep follow-up with PCP.  3.  Erectile dysfunction.  Patient follows with urologist in [place].  No specific heart cardiac contraindication for him to use sildenafil or similar agents.  4.  COPD, followed by Pulmonary.  COVID-19 Education: The signs and symptoms of COVID-19 were discussed with the patient and how to seek care for testing (follow up with PCP or arrange E-visit).  The importance of social distancing was discussed today.  Time:   Today, I have spent 8 minutes with the patient with telehealth technology discussing the above problems.     Medication Adjustments/Labs and Tests Ordered: Current medicines are reviewed at length with the patient today.  Concerns regarding medicines are outlined above.   Tests Ordered: No orders of the defined types were placed in this encounter.   Medication Changes: No orders of the defined types were placed in this encounter.   Follow  Up:  In Person 6 months in the [place] office.  Signed, Rozann Lesches, MD  04/16/2019 11:35 AM    [place]

## 2019-05-06 ENCOUNTER — Ambulatory Visit: Payer: Medicare Other | Admitting: Cardiology

## 2019-05-15 ENCOUNTER — Telehealth: Payer: Self-pay | Admitting: Cardiology

## 2019-05-15 NOTE — Telephone Encounter (Signed)
Patient called stating that he saw a Rheumatologist on 04-28-2019 for pain. He was given Tixanidine which is thinks is causing his BP to drop.Marland Kitchen

## 2019-05-15 NOTE — Telephone Encounter (Signed)
This is not a medication with which I am overly familiar, however quick review of possible side effects does find hypotension.  Agree with him stopping the medication for now.  He should review with his rheumatologist and determine if there is a suitable alternative.

## 2019-05-15 NOTE — Telephone Encounter (Signed)
Patient notified and verbalized understanding. 

## 2019-05-15 NOTE — Telephone Encounter (Signed)
Tizanidine 4mg  - three times per day for muscle spasm.  Taking for pain in calfs per Rheumatologist.  Been taking since 04/28/19. Stated that since being on this - has had about 3 episodes of BP dropping.   Yesterday - BP 60/40 after getting up from nap yesterday.  Elevated feet & got something to drink then came up to 90/70, then 106/78.  States that he has only noticed this since beginning this medication.   HR running 58.    Last evening BP without medication - 122/98  74   Stated he did call the Rheumatologist this morning as well, but has not heard back yet.  Stated that he has taken himself off for now.

## 2019-06-10 NOTE — Telephone Encounter (Signed)
Error

## 2019-07-21 ENCOUNTER — Telehealth: Payer: Self-pay | Admitting: Cardiology

## 2019-07-21 NOTE — Telephone Encounter (Signed)
We do recommend you get the Covid vaccine. However, if you have additional questions please call your Primary Care doctor to discuss if the Vaccine is right for you. 

## 2019-07-25 ENCOUNTER — Other Ambulatory Visit: Payer: Self-pay

## 2019-07-25 ENCOUNTER — Telehealth: Payer: Self-pay | Admitting: Pulmonary Disease

## 2019-07-25 ENCOUNTER — Encounter: Payer: Self-pay | Admitting: Pulmonary Disease

## 2019-07-25 ENCOUNTER — Telehealth (INDEPENDENT_AMBULATORY_CARE_PROVIDER_SITE_OTHER): Payer: Medicare Other | Admitting: Pulmonary Disease

## 2019-07-25 DIAGNOSIS — J449 Chronic obstructive pulmonary disease, unspecified: Secondary | ICD-10-CM

## 2019-07-25 DIAGNOSIS — I776 Arteritis, unspecified: Secondary | ICD-10-CM

## 2019-07-25 DIAGNOSIS — Z0000 Encounter for general adult medical examination without abnormal findings: Secondary | ICD-10-CM | POA: Insufficient documentation

## 2019-07-25 MED ORDER — LEVALBUTEROL HCL 0.63 MG/3ML IN NEBU: 0.6300 mg | INHALATION_SOLUTION | Freq: Four times a day (QID) | RESPIRATORY_TRACT | 6 refills | Status: DC | PRN

## 2019-07-25 NOTE — Telephone Encounter (Signed)
Pt has not been seen since 06/21/17. Called and spoke with pt letting him know that he will need to have an OV prior to [place] able to send any meds to pharmacy for him. Pt verbalized understanding and was scheduled for a virtual visit with Aaron Edelman this afternoon 1/15 at 4:00. Nothing further needed.

## 2019-07-25 NOTE — Assessment & Plan Note (Signed)
Plan:  Continue Symbicort 160 Refilled neb treatment  2-3 month follow up with Dr. Elsworth Soho

## 2019-07-25 NOTE — Progress Notes (Signed)
Virtual Visit via Video Note  I connected with Matthew Hall on 07/25/19 at  4:00 PM EST by a video enabled telemedicine application and verified that I am speaking with the correct person using two identifiers.  Location: Patient: Home Provider: Office - [facility] - [id number], [address], [place], [place] [address]  I discussed the limitations of evaluation and management by telemedicine and the availability of in person appointments. The patient expressed understanding and agreed to proceed. I also discussed with the patient that there may be a patient responsible charge related to this service. The patient expressed understanding and agreed to proceed.  Patient consented to consult via telephone: Yes People present and their role in pt care: Pt   History of Present Illness:  74 year old male former smoker followed in our office for COPD  Past medical history: Hypertension, A. fib, lymphocytic vasculitis, leukocytosis Smoking history: Former smoker. Quit smoking 2012. 30 pack year smoker.  Maintenance: Symbicort 160 Patient Dr. Elsworth Soho  Chief complaint: Follow-up, med refill  Patient was last seen in our office in 2018 he was treated for an exacerbation with [place].  He contacted our office for refill of his nebulized meds and he was scheduled for a video visit for further evaluation.  Patient reports he has been receiving the Symbicort 160 through his primary care provider.  He reports adherence to this.  He uses his nebulized meds daily.  Specifically to help with congestion.  He reports his last antibiotic use was around 6 months ago.  He is interested in obtaining the Covid vaccine.   Observations/Objective:  11/30/2017-pulmonary function test-FVC 1.78 (39% predicted), postbronchodilator ratio 55, postbronchodilator FEV1 1.01 (30% predicted), no bronchodilator response  Social History   Tobacco Use  Smoking Status Former Smoker  . Packs/day: 1.00  . Years: 30.00   . Pack years: 30.00  . Types: Cigarettes  . Quit date: 07/07/2011  . Years since quitting: 8.0  Smokeless Tobacco Never Used   Immunization History  Administered Date(s) Administered  . Influenza Split 05/08/2017  . Influenza Whole 06/05/1995, 03/31/2008  . Influenza,inj,Quad PF,6+ Mos 04/20/2016  . Influenza-Unspecified 04/09/2014, 04/19/2015, 04/09/2018  . Pneumococcal Conjugate-13 04/09/2014, 03/01/2016, 04/09/2017  . Pneumococcal Polysaccharide-23 05/02/1994, 08/07/2000  . Rabies, IM 12/07/2014, 12/10/2014, 12/14/2014, 12/21/2014  . Tdap 02/04/2017  . Zoster Recombinat (Shingrix) 04/10/2015, 04/09/2017    Assessment and Plan:  COPD without exacerbation ([place]) Plan:  Continue Symbicort 160 Refilled neb treatment  2-3 month follow up with Dr. 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 Scarlette Shorts - Rheumatology  Continue prednisone as managed by rheum   Healthcare maintenance COVID-19 Vaccine Information can be found at: [email or web address].uk For questions related to vaccine distribution or appointments, please email [email or web address] or call [phone].     Follow Up Instructions:  Return in about 3 months (around 10/23/2019), or if symptoms worsen or fail to improve, for Follow up with Dr. Elsworth Soho - video visit ok .    I discussed the assessment and treatment plan with the patient. The patient was provided an opportunity to ask questions and all were answered. The patient agreed with the plan and demonstrated an understanding of the instructions.   The patient was advised to call back or seek an in-person evaluation if the symptoms worsen or if the condition fails to improve as anticipated.  I provided  32 minutes of non-face-to-face time during this encounter.   Lauraine Rinne, NP

## 2019-07-25 NOTE — Assessment & Plan Note (Signed)
COVID-19 Vaccine Information can be found at: [email or web address] For questions related to vaccine distribution or appointments, please email [email or web address] or call [phone].    

## 2019-07-25 NOTE — Patient Instructions (Addendum)
You were seen today by Lauraine Rinne, NP  for:   1. 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)  - levalbuterol (XOPENEX) 0.63 MG/3ML nebulizer solution; Take 3 mLs (0.63 mg total) by nebulization every 6 (six) hours as needed for wheezing or shortness of breath.  Dispense: 75 mL; Refill: 6  Continue Symbicort 160 >>> 2 puffs in the morning right when you wake up, rinse out your mouth after use, 12 hours later 2 puffs, rinse after use >>> Take this daily, no matter what >>> This is not a rescue inhaler   Note your daily symptoms > remember "red flags" for COPD:   >>>Increase in cough >>>increase in sputum production >>>increase in shortness of breath or activity  intolerance.   If you notice these symptoms, please call the office to be seen.    2. 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 Healthcare maintenance  COVID-19 Vaccine Information can be found at: [email or web address].uk For questions related to vaccine distribution or appointments, please email [email or web address] or call [phone].    We recommend today:   Meds ordered this encounter  Medications  . levalbuterol (XOPENEX) 0.63 MG/3ML nebulizer solution    Sig: Take 3 mLs (0.63 mg total) by nebulization every 6 (six) hours as needed for wheezing or shortness of breath.    Dispense:  75 mL    Refill:  6    Please use DX code: J44.9    Follow Up:    Return in about 3 months (around 10/23/2019), or if symptoms worsen or fail to improve, for Follow up with Dr. Elsworth Soho - video visit ok .   Please do your part to reduce the spread of COVID-19:      Reduce your risk of any infection  and COVID19 by using the similar precautions used for avoiding the common cold or flu:  Marland Kitchen Wash your hands often with soap and warm water for at least 20 seconds.  If soap and water are not readily available, use an alcohol-based hand sanitizer with at least  60% alcohol.  . If coughing or sneezing, cover your mouth and nose by coughing or sneezing into the elbow areas of your shirt or coat, into a tissue or into your sleeve (not your hands). Langley Gauss A MASK when in public  . Avoid shaking hands with others and consider head nods or verbal greetings only. . Avoid touching your eyes, nose, or mouth with unwashed hands.  . Avoid close contact with people who are sick. . Avoid places or events with large numbers of people in one location, like concerts or sporting events. . If you have some symptoms but not all symptoms, continue to monitor at home and seek medical attention if your symptoms worsen. . If you are having a medical emergency, call 911.   [place] / e-Visit: [email or web address]         [facility]: [facility]: [id number]                   [facility]: [id number]     It is flu season:   >>> Best ways to protect herself from the flu: Receive the yearly flu vaccine, practice good hand hygiene washing with soap and also using hand sanitizer when available, eat a nutritious meals, get adequate rest, hydrate appropriately   Please contact the office if your symptoms worsen or you  have concerns that you are not improving.   Thank you for choosing [facility] for your healthcare, and for allowing [place] to partner with you on your healthcare journey. I am thankful to be able to provide care to you today.   Wyn Quaker FNP-C

## 2019-07-25 NOTE — Assessment & Plan Note (Signed)
Plan:  Continue to follow up with Dr. Scarlette Shorts - Rheumatology  Continue prednisone as managed by rheum

## 2019-10-13 IMAGING — CT CT RENAL STONE PROTOCOL
2 of 4 series · 16 of 46 positions shown, 18 images · non-contrast
Comparison: None.

CLINICAL DATA: Left abdominal pain for 2 days.

EXAM:
CT ABDOMEN AND PELVIS WITHOUT CONTRAST
TECHNIQUE: Multidetector CT imaging of the abdomen and pelvis was performed
following the standard protocol without IV contrast.

[Series 2: axial st · axial · 0.77mm/px · z∈[+1033,+1408]mm · 13 of 86 slices shown, 15 images]
[im 7/86  soft-tissue]
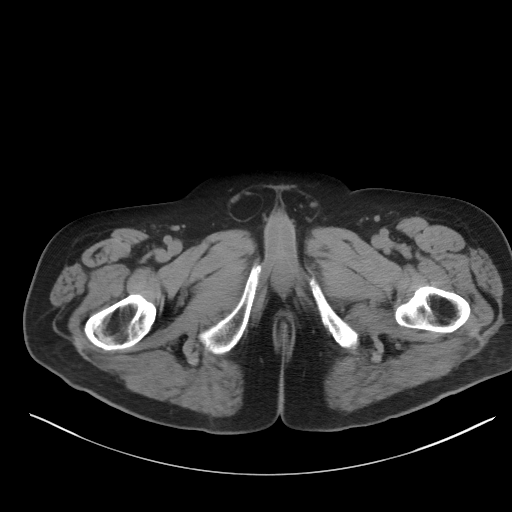
[im 7/86  bone]
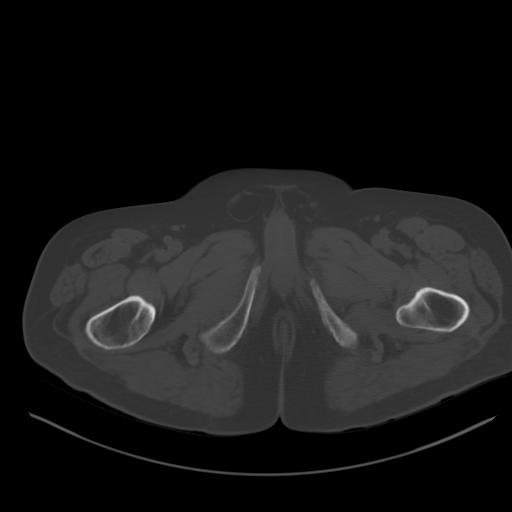
[im 13/86  soft-tissue]
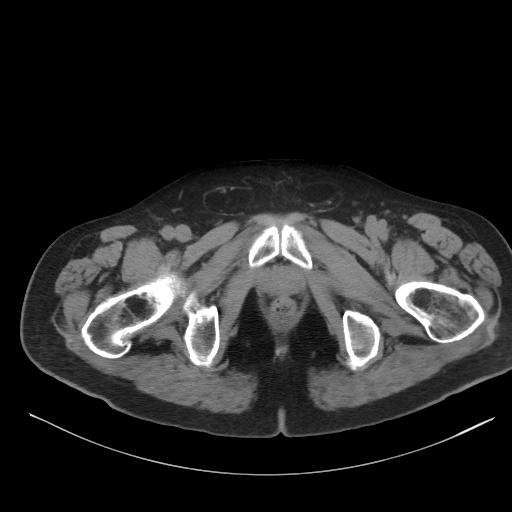
[im 19/86  soft-tissue]
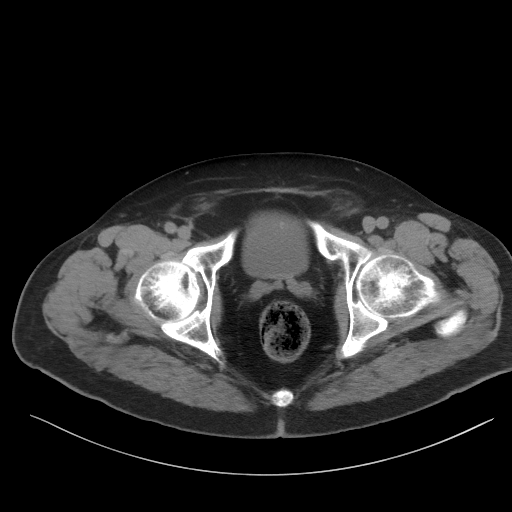
[im 26/86  soft-tissue]
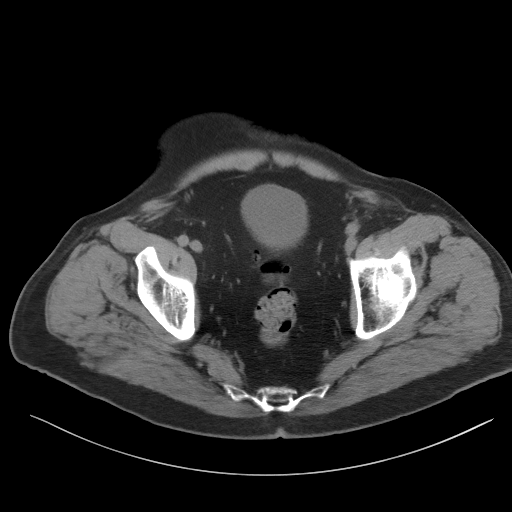
[im 32/86  soft-tissue]
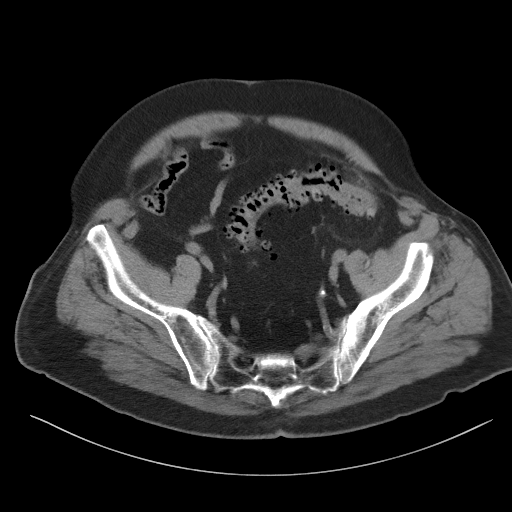
[im 38/86  soft-tissue]
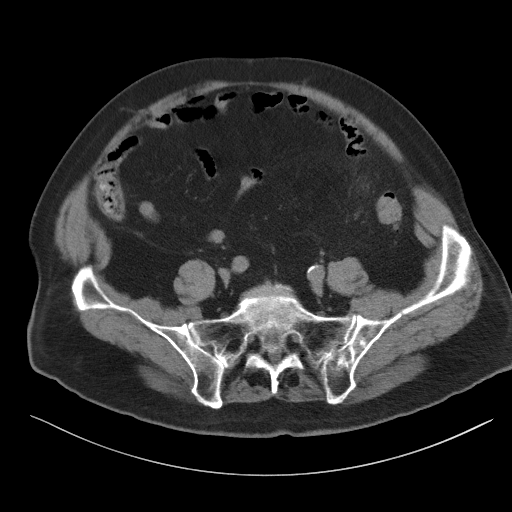
[im 45/86  soft-tissue]
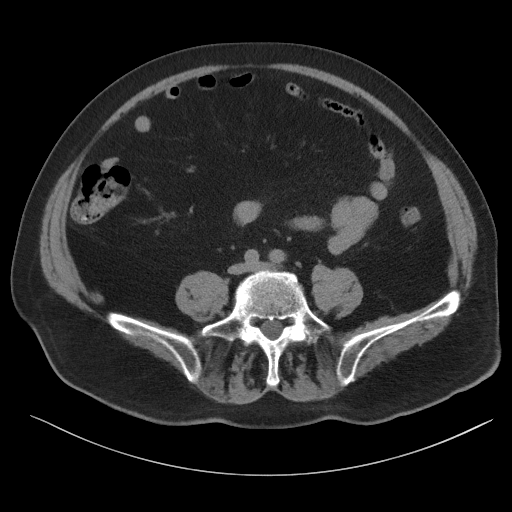
[im 51/86  soft-tissue]
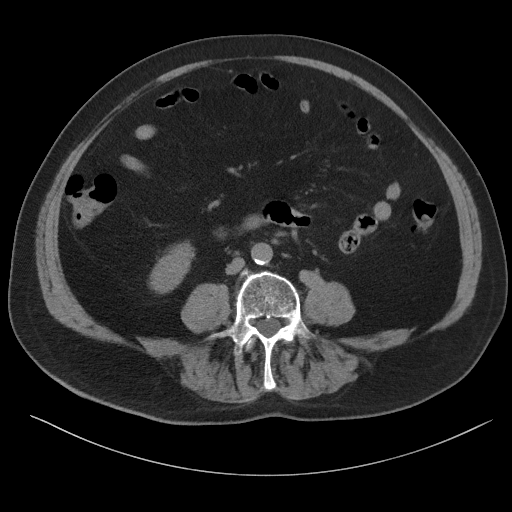
[im 57/86  soft-tissue]
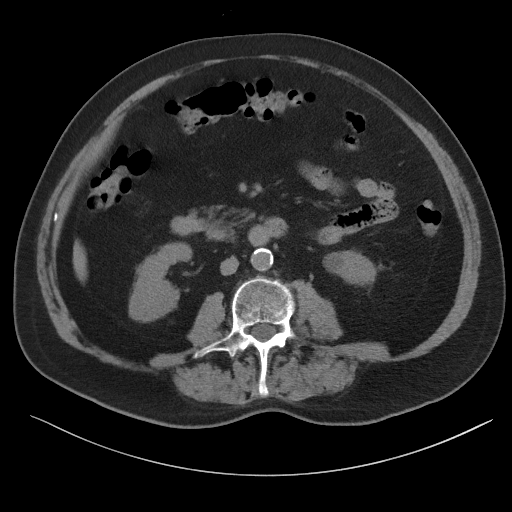
[im 57/86  bone]
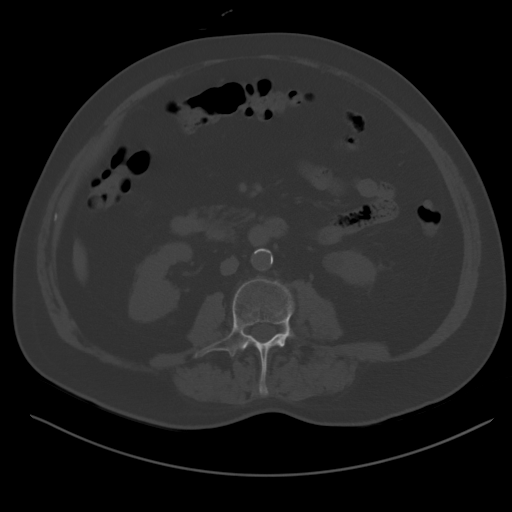
[im 63/86  soft-tissue]
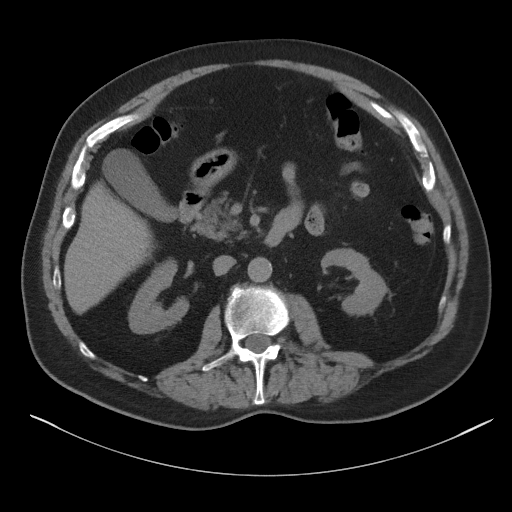
[im 70/86  soft-tissue]
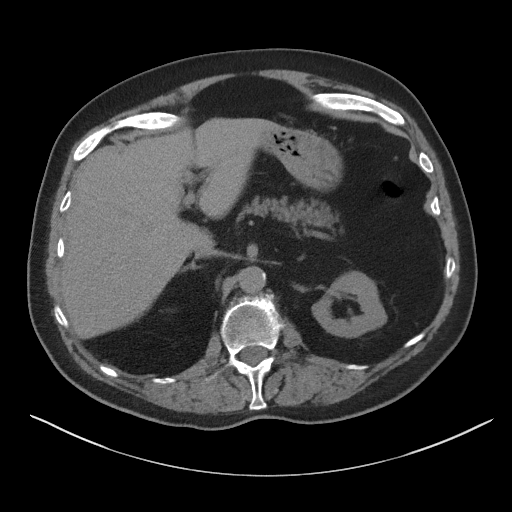
[im 76/86  soft-tissue]
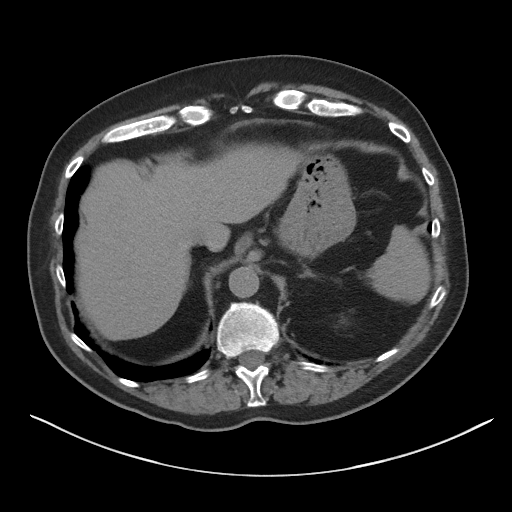
[im 82/86  soft-tissue]
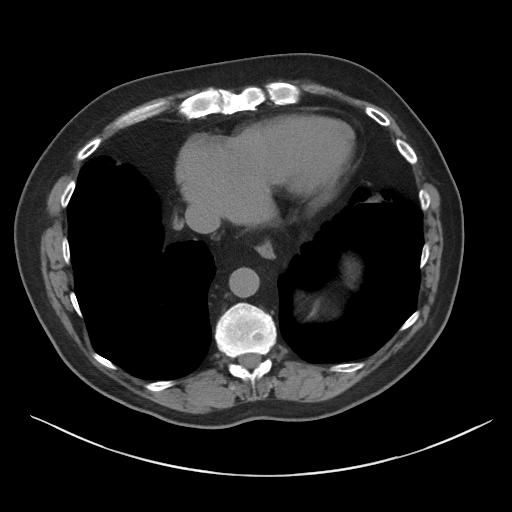

[Series 5: coronal st · coronal · 0.81mm/px · 3 of 98 slices shown]
[im 33/98  soft-tissue]
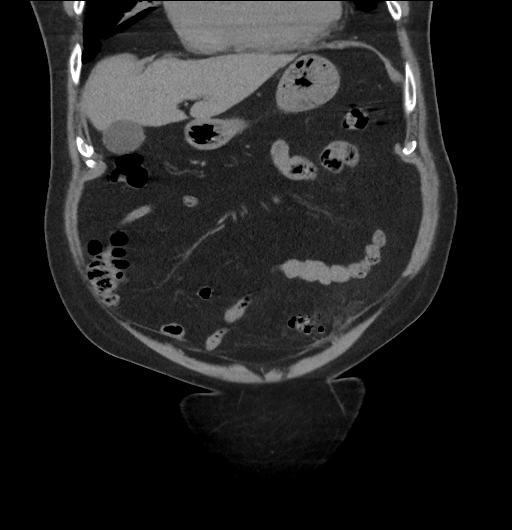
[im 44/98  soft-tissue]
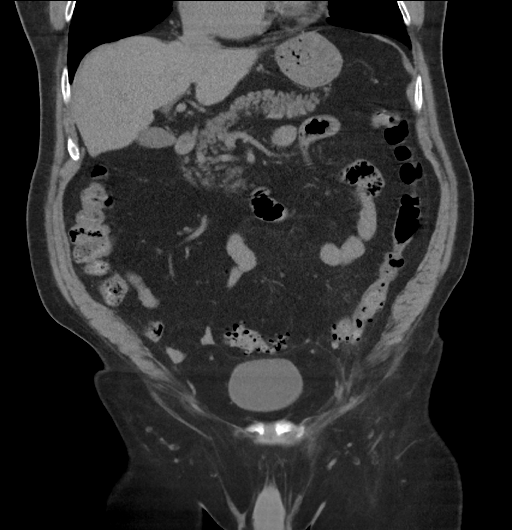
[im 54/98  soft-tissue]
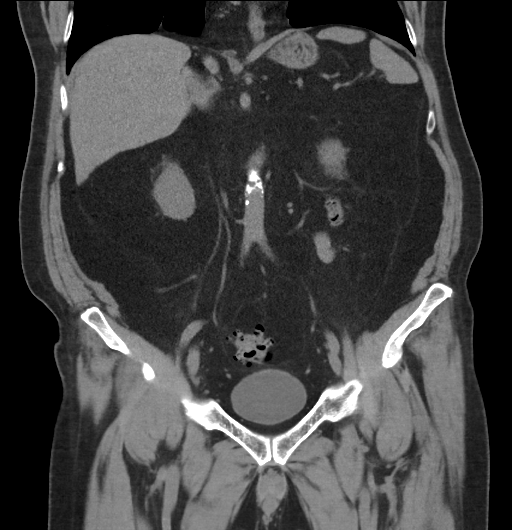

[16 of 46 positions shown; findings below may reference images not displayed]

FINDINGS: Lower chest: Bronchiectasis, peribronchial thickening in the right
lower lobe. Airspace disease versus atelectasis in the right middle
lobe, partially visualized. Probable atelectasis in the lingula.

Hepatobiliary: Incompletely characterized 1.2 cm right hepatic
hypoattenuated lesion. Normal gallbladder.

Pancreas: Unremarkable. No pancreatic ductal dilatation or
surrounding inflammatory changes.

Spleen: Normal in size without focal abnormality.

Adrenals/Urinary Tract: Adrenal glands are unremarkable. Kidneys are
normal, without renal calculi, focal lesion, or hydronephrosis.
Nonspecific bilateral perinephric stranding. Bladder is
unremarkable.

Stomach/Bowel: Normal appearance of the stomach and small bowel.
Normal appendix. Extensive left colonic diverticulosis. Long segment
of circumferential mucosal thickening of the sigmoid colon. Focal
area of pericolonic inflammatory changes at the junction of distal
descending colon/proximal sigmoid colon with focal asymmetric
mucosal thickening of the bowel, image 53/86, sequence 2, axial
images and image 40/98, sequence 5, coronal images.

Vascular/Lymphatic: Aortic atherosclerosis. No enlarged abdominal or
pelvic lymph nodes.

Reproductive: Prostate is unremarkable.

Other: Bilateral fat containing inguinal hernias and small fat
containing periumbilical anterior abdominal wall hernia.

Musculoskeletal: No acute or significant osseous findings.
IMPRESSION: Focal asymmetric mucosal thickening at the junction of the
descending colon/proximal sigmoid colon with pericolonic
inflammatory changes, on the background of extensive diverticulosis
of the left colon and long segment of circumferential mucosal
thickening of the sigmoid colon. These findings may represent acute
on chronic diverticulitis. However primary colonic malignancy cannot
be excluded. Correlation to colonoscopy, after resolution of the
acute symptomatology may be considered. No evidence of colonic
rupture or abscess formation.

12 mm incompletely characterized due to lack of IV contrast lesion
within the right lobe of the liver. Further evaluation with
contrast-enhanced CT or MRI of the abdomen may be considered.

Right lower lobe bronchiectasis and peribronchial thickening, likely
due to chronic bronchitis.

Incompletely visualized atelectasis versus airspace consolidation in
the right middle lobe.

## 2019-10-19 IMAGING — CT CT ABD-PELV W/ CM
2 of 5 series · 16 of 46 positions shown, 18 images · IV contrast (Isovue)
Comparison: CT abdomen pelvis of 07/27/2017

CLINICAL DATA: History of diverticulitis, completed antibiotic
treatment, follow-up

EXAM:
CT ABDOMEN AND PELVIS WITH CONTRAST
TECHNIQUE: Multidetector CT imaging of the abdomen and pelvis was performed
using the standard protocol following bolus administration of
intravenous contrast.
CONTRAST:  100mL ZPKCIJ-9VV IOPAMIDOL (ZPKCIJ-9VV) INJECTION 61%

[Series 2: axial st · axial · 0.79mm/px · z∈[+1000,+1400]mm · 13 of 91 slices shown, 15 images]
[im 6/91  soft-tissue]
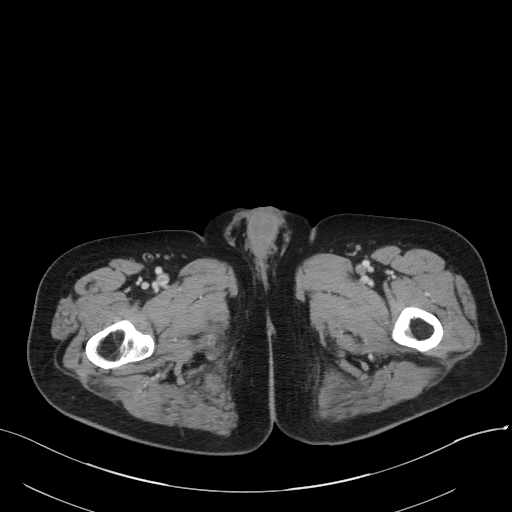
[im 6/91  bone]
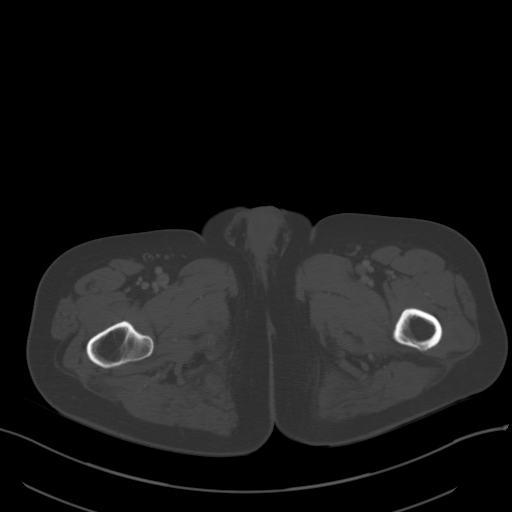
[im 11/91  soft-tissue]
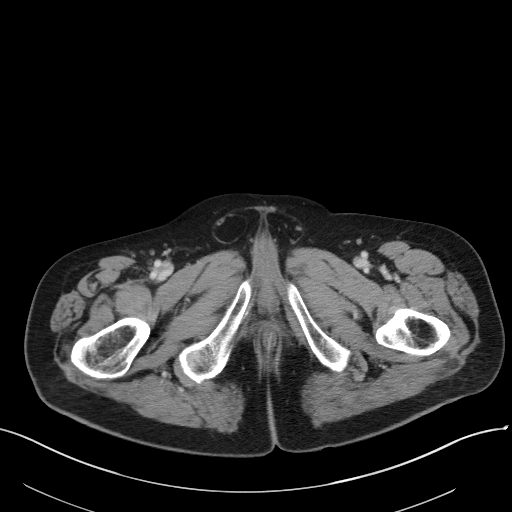
[im 21/91  soft-tissue]
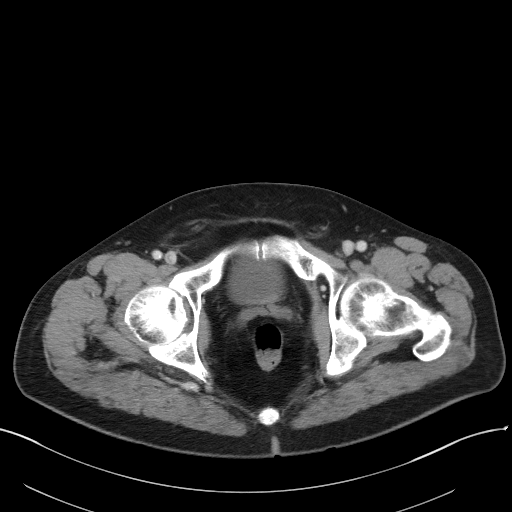
[im 26/91  soft-tissue]
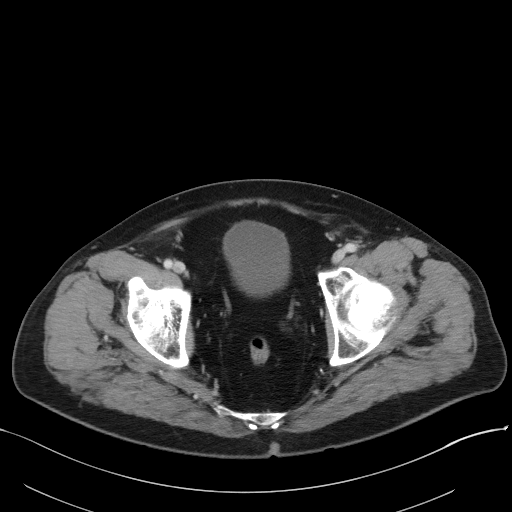
[im 31/91  soft-tissue]
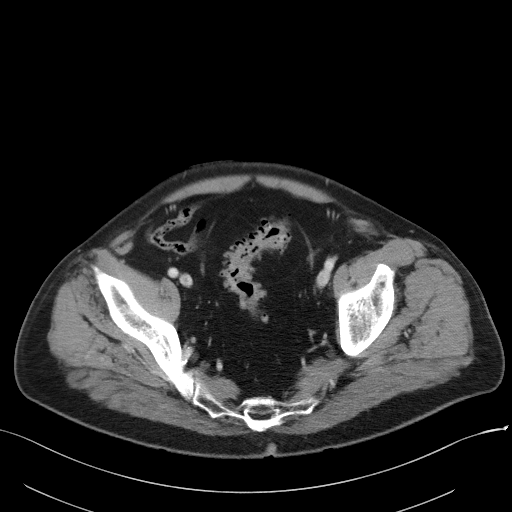
[im 41/91  soft-tissue]
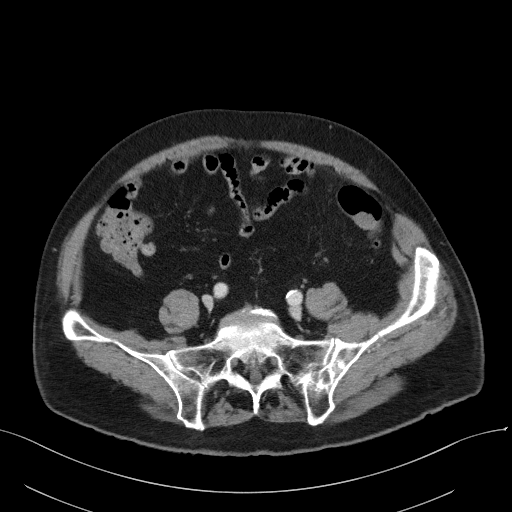
[im 46/91  soft-tissue]
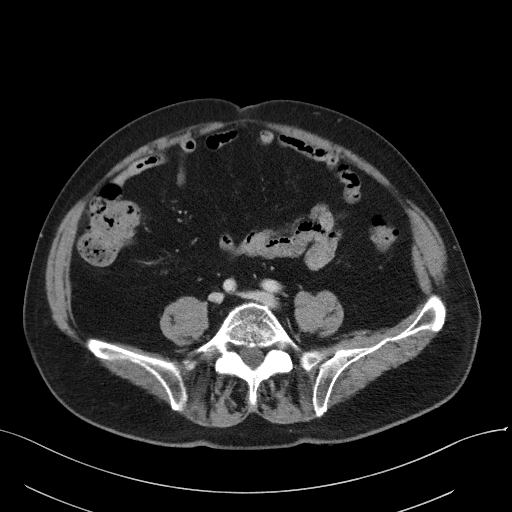
[im 51/91  soft-tissue]
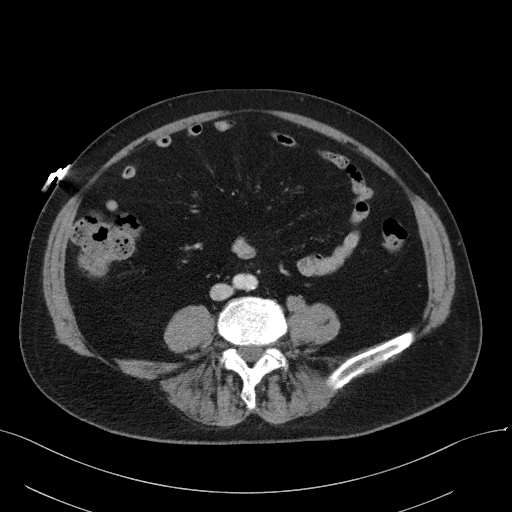
[im 61/91  soft-tissue]
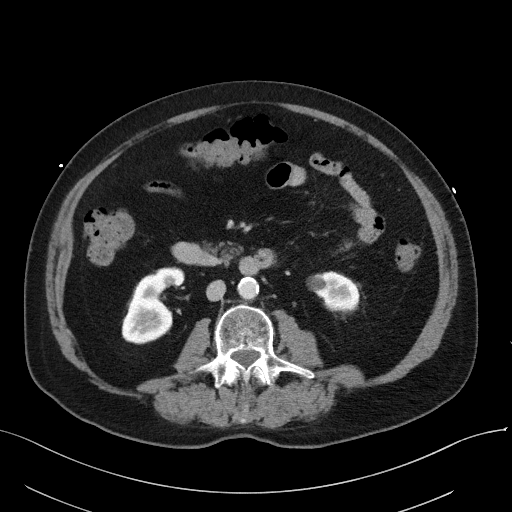
[im 61/91  bone]
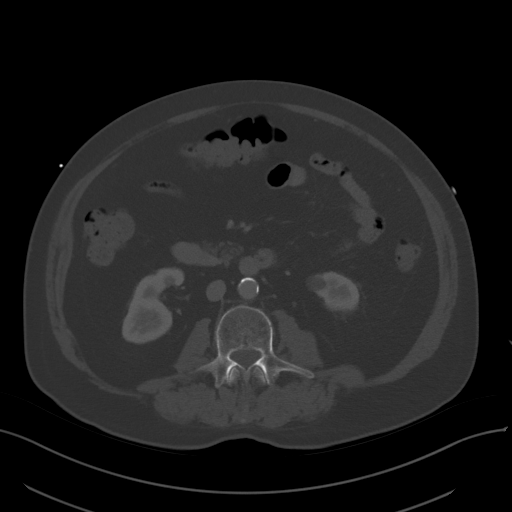
[im 66/91  soft-tissue]
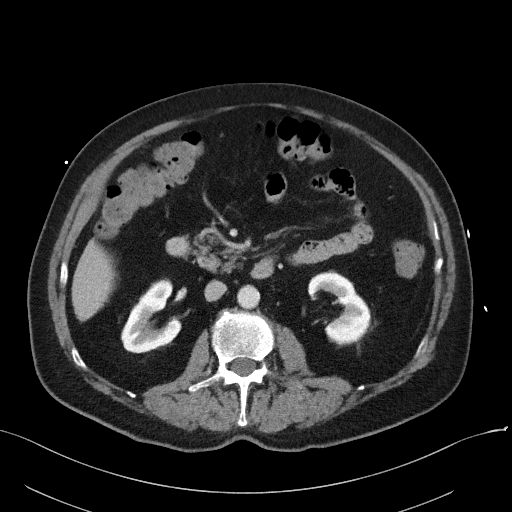
[im 71/91  soft-tissue]
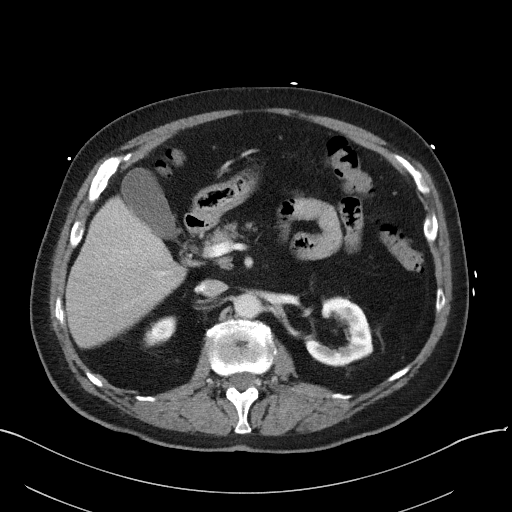
[im 81/91  soft-tissue]
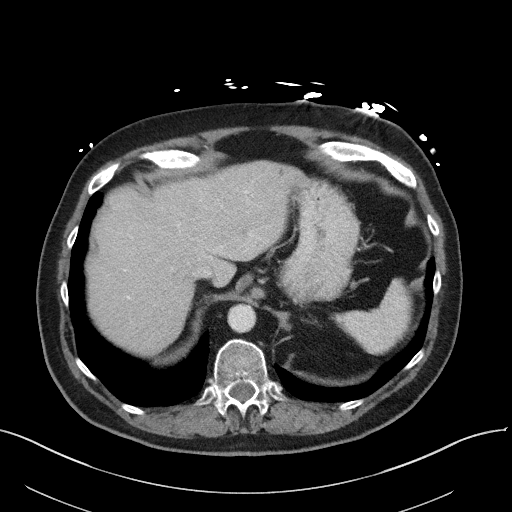
[im 86/91  soft-tissue]
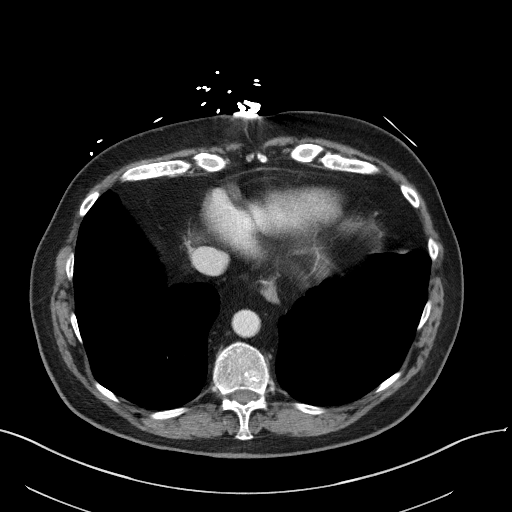

[Series 6: coronal st · coronal · 0.79mm/px · 3 of 110 slices shown]
[im 37/110  soft-tissue]
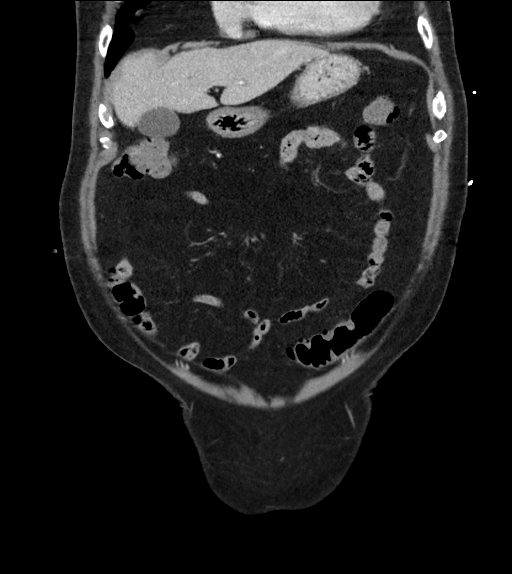
[im 49/110  soft-tissue]
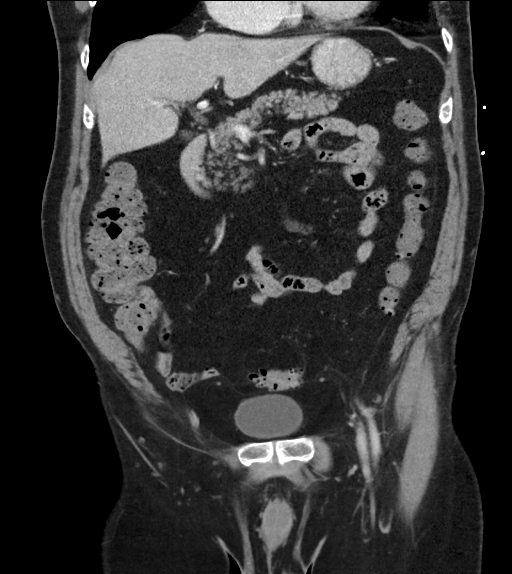
[im 61/110  soft-tissue]
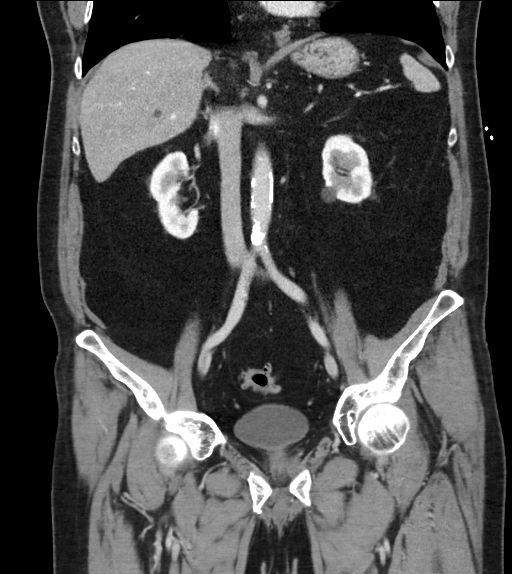

[16 of 46 positions shown; findings below may reference images not displayed]

FINDINGS: Lower chest: There is mild bronchiectatic change involving the right
lower lobe. No pneumonia or effusion is seen. The heart is mildly
enlarged.

Hepatobiliary: The liver enhances and there is no change in the
small low-attenuation structure inferiorly in the right lobe
medially most consistent with benign process such as cyst. No other
hepatic abnormality is seen. No calcified gallstones are noted.
There is however slightly higher attenuation near the neck of the
gallbladder and small gallstones or sludge would be a consideration.
If further assessment is warranted, ultrasound is recommended.

Pancreas: The pancreas is unremarkable. The pancreatic duct is not
dilated.

Spleen: The spleen is stable with no abnormality noted.

Adrenals/Urinary Tract: The adrenal glands appear normal. The
kidneys enhance with no mass or calculus. On delayed images, the
pelvocaliceal systems are unremarkable. The ureters are normal in
caliber. The urinary bladder is not well distended but no
abnormality is seen.

Stomach/Bowel: The stomach is not well distended but no abnormality
is evident. No small bowel distention is seen. The pericolonic
strandiness at the junction of the distal CT descending and proximal
sigmoid colon has resolved most consistent with resolved mild
uncomplicated diverticulitis. Multiple rectosigmoid colon
diverticula again are noted with diverticula scattered throughout
the more proximal colon as well. The terminal ileum and the appendix
are unremarkable.

Vascular/Lymphatic: The abdominal aorta is normal in caliber with
moderate abdominal aortic atherosclerosis present. No adenopathy is
seen.

Reproductive: The prostate gland is normal in size for age.

Other: Fat enters both inguinal canals.

Musculoskeletal: The lumbar vertebrae are in normal alignment.
Intervertebral disc spaces appear normal. The SI joints appear
corticated.
IMPRESSION: 1. Resolution of the mild diverticulitis at the junction of the
distal descending and proximal sigmoid colon. Multiple colonic
diverticula are noted.
2. Stable bronchiectatic change in the right lower lobe.
3. Cannot exclude gallbladder sludge or small noncalcified
gallstones within the neck of the gallbladder. Consider ultrasound
if necessary clinically.
4. Moderate abdominal aortic atherosclerosis.

## 2019-10-29 NOTE — Progress Notes (Addendum)
Cardiology Office Note  Date: 10/29/2019   ID: [facility], DOB Nov 28, 1945, MRN [id number]  PCP:  Matthew Mallet, MD  Cardiologist:  Matthew Lesches, MD Electrophysiologist:  None   Chief Complaint: F/U Afib, HTN,   History of Present Illness: [facility] is a 74 y.o. male with a history of Afib, HTN, COPD, ED.  Last encounter with Dr. Domenic Hall on 04/16/2019.  He reported no exertional angina or palpitations and was compliant with his medications.  He was continuing Coumadin without any bleeding problems.  He was seeing a urologist in [place] secondary to ED.  His heart rate was under good control with Toprol.  Blood pressure was well controlled on current therapy.  He sees pulmonology for COPD.  Recent telephone call from him regarding a new medication his rheumatologist had him start called Tixanadine (a muscle relaxant)   He thinks that it was dropping his blood pressure.  One of the side effects of this medication is decreases in blood pressure.  He has since stopped that medication.  He states he has been doing well from a cardiac standpoint.  His heart rate is well controlled on current medications.  He apparently manages his own Coumadin dosage and does his own INRs at home.  He has  a POC machine to check his INRs.  States INR stays between 2 and 3.  He has chronic dyspnea from COPD.  States he becomes short of breath with more than minimal activity.  He [facility] in [facility] [place].  Otherwise he denies any palpitations, bleeding, orthostatic symptoms, CVA or TIA-like symptoms, or lower extremity edema.  States he had some recent lab work from his primary care physician and the only thing that was elevated was his hemoglobin A1c.  He states PCP has told him he is prediabetic.  He is asking for ED medication.  He states his urologist wanted cardiology input on starting the medication.  Past Medical History:  Diagnosis Date  . Anxiety   . Aortic  atherosclerosis ([facility])   . Atrial fibrillation ([facility])   . Basal cell carcinoma   . Bladder cancer ([facility])   . Bladder tumor   . Colon polyps   . COPD (chronic obstructive pulmonary disease) ([facility])   . Cutaneous vasculitis   . Diverticulosis   . Essential hypertension   . GERD (gastroesophageal reflux disease)   . History of pneumonia   . Nephrolithiasis   . Osteoarthritis     Past Surgical History:  Procedure Laterality Date  . COLONOSCOPY    . Kidney stones removed  1996  . SKIN CANCER EXCISION Left    2003  . TRANSURETHRAL RESECTION OF BLADDER TUMOR  1996    Current Outpatient Medications  Medication Sig Dispense Refill  . Albuterol Sulfate 108 (90 Base) MCG/ACT AEPB Inhale 2 puffs into the lungs every 6 (six) hours as needed. 1 each 3  . ALPRAZolam (XANAX) 0.25 MG tablet Take 0.25 mg by mouth 2 (two) times daily as needed for anxiety or sleep.   3  . budesonide-formoterol (SYMBICORT) 160-4.5 MCG/ACT inhaler Inhale 2 puffs into the lungs 2 (two) times daily. 1 Inhaler 6  . cetirizine (ZYRTEC) 5 MG tablet Take 5 mg by mouth daily.    . D3-50 50000 units capsule Take 50,000 Units by mouth once a week. Usually Tuesday or Wednesday.  0  . erythromycin ophthalmic ointment Place 1 application into both eyes daily as needed (Infection).    . hydroxypropyl  methylcellulose / hypromellose (ISOPTO TEARS / GONIOVISC) 2.5 % ophthalmic solution Place 1 drop into both eyes as needed for dry eyes.    Marland Kitchen levalbuterol (XOPENEX) 0.63 MG/3ML nebulizer solution Take 3 mLs (0.63 mg total) by nebulization every 6 (six) hours as needed for wheezing or shortness of breath. 75 mL 6  . losartan-hydrochlorothiazide (HYZAAR) 50-12.5 MG tablet Take 1 tablet by mouth daily.     . metoprolol succinate (TOPROL-XL) 50 MG 24 hr tablet Take 1 tablet (50 mg total) by mouth 2 (two) times daily. 60 tablet 0  . Multiple Vitamin (MULTIVITAMIN WITH MINERALS) TABS tablet Take 1 tablet by mouth daily.    Marland Kitchen omeprazole  (PRILOSEC) 40 MG capsule Take 40 mg by mouth daily.    Marland Kitchen triamcinolone (NASACORT) 55 MCG/ACT AERO nasal inhaler Place 1 spray into the nose daily as needed (Congestion).     . warfarin (COUMADIN) 2.5 MG tablet Take 2.5 mg by mouth daily.     No current facility-administered medications for this visit.   Allergies:  Simvastatin, Albuterol, Augmentin [amoxicillin-pot clavulanate], Ciprofloxacin, Contrast media [iodinated diagnostic agents], Flagyl [metronidazole], Latex, and Sulfa antibiotics   Social History: The patient  reports that he quit smoking about 8 years ago. His smoking use included cigarettes. He has a 30.00 pack-year smoking history. He has never used smokeless tobacco. He reports that he does not drink alcohol or use drugs.   Family History: The patient's family history includes CAD in his mother; Diabetes in his mother and sister; Emphysema in his father; Liver cancer in his brother; Lung cancer in his brother; Stroke in his sister.   ROS:  Please see the history of present illness. Otherwise, complete review of systems is positive for none.  All other systems are reviewed and negative.   Physical Exam: VS:  There were no vitals taken for this visit., BMI There is no height or weight on file to calculate BMI.  Wt Readings from Last 3 Encounters:  04/16/19 174 lb (78.9 kg)  08/09/18 174 lb 9.6 oz (79.2 kg)  06/04/18 174 lb 2.6 oz (79 kg)    General: Patient appears comfortable at rest. Neck: Supple, no elevated JVP or carotid bruits, no thyromegaly. Lungs: Prolonged expiratory phase, nonlabored breathing at rest. Cardiac: Irregularly irregular rate and rhythm, no S3 or significant systolic murmur, no pericardial rub. Extremities: No pitting edema, distal pulses 2+. Skin: Warm and dry. Musculoskeletal: No kyphosis. Neuropsychiatric: Alert and oriented x3, affect grossly appropriate.  ECG:  An ECG dated 10/30/2019. was personally reviewed today and demonstrated:  Atrial  flutter/fibrillation rate of 59 nonspecific QRS widening and anterior fascicular block, old anterior infarct, nonspecific ST depression  Recent Labwork: No results found for requested labs within last 8760 hours.  No results found for: CHOL, TRIG, HDL, CHOLHDL, VLDL, LDLCALC, LDLDIRECT  Other Studies Reviewed Today:  Echocardiogram 07/09/2016: Study Conclusions  - Left ventricle: The cavity size was normal. Wall thickness was increased in a pattern of mild LVH. Systolic function was vigorous. The estimated ejection fraction was in the range of 65% to 70%. Wall motion was normal; there were no regional wall motion abnormalities. The study is not technically sufficient to allow evaluation of LV diastolic function. - Mitral valve: Calcified annulus. - Left atrium: The atrium was moderately dilated. - Right atrium: The atrium was moderately dilated.  Assessment and Plan:  1. Permanent atrial fibrillation ([place])   2. Essential hypertension   3. Chronic obstructive pulmonary disease, unspecified COPD type ([place])  1. Permanent atrial fibrillation ([facility]) CHA2DS2-VASc score 3 on Coumadin.  Patient denies any palpitations or arrhythmias.  His heart rate is controlled at a rate of 59 today.  He checks his own INRs at home.  States that INR stays between 2 and 3.  Continue Coumadin 2.5 mg daily, Toprol-XL 50 mg daily.  2. Essential hypertension Blood pressure is well controlled on current therapy.  Continue losartan hydrochlorothiazide 50/12.5 daily.  3. Chronic obstructive pulmonary disease, unspecified COPD type ([place]) Patient has chronic dyspnea on exertion.  He sees [facility] in [place].  4.  ED Patient is asking for ED medication.  He states his urologist wanted cardiology input on starting ED medication.  He is okay to start erectile dysfunction medication.  Nursing staff conveyed this information to the patient to relay to his urologist  Medication  Adjustments/Labs and Tests Ordered: Current medicines are reviewed at length with the patient today.  Concerns regarding medicines are outlined above.   Disposition: Follow-up with Dr. Domenic Hall or APP in 6 months.  Signed, Levell July, NP 10/29/2019 9:35 PM    [facility] at [place], [place], [place] [address] Phone: [phone]; Fax: [phone]

## 2019-10-30 ENCOUNTER — Other Ambulatory Visit: Payer: Self-pay

## 2019-10-30 ENCOUNTER — Encounter: Payer: Self-pay | Admitting: Family Medicine

## 2019-10-30 ENCOUNTER — Ambulatory Visit (INDEPENDENT_AMBULATORY_CARE_PROVIDER_SITE_OTHER): Payer: Medicare Other | Admitting: Family Medicine

## 2019-10-30 ENCOUNTER — Telehealth: Payer: Self-pay | Admitting: *Deleted

## 2019-10-30 VITALS — BP 130/80 | HR 60 | Ht 71.0 in | Wt 184.8 lb

## 2019-10-30 DIAGNOSIS — I4821 Permanent atrial fibrillation: Secondary | ICD-10-CM

## 2019-10-30 DIAGNOSIS — I10 Essential (primary) hypertension: Secondary | ICD-10-CM | POA: Diagnosis not present

## 2019-10-30 DIAGNOSIS — J449 Chronic obstructive pulmonary disease, unspecified: Secondary | ICD-10-CM

## 2019-10-30 NOTE — Telephone Encounter (Signed)
Contacted Dr. Billy Fischer office to advise that per [facility], there were no cardiac contraindications for patient taking sildenafil. Office note from 04/15/20 faxed to [facility].   3.  Erectile dysfunction.  Patient follows with urologist in [place].  No specific heart cardiac contraindication for him to use sildenafil or similar agents.

## 2019-10-30 NOTE — Patient Instructions (Addendum)

## 2020-02-11 IMAGING — DX DG CHEST 2V
2 series · 2 of 2 positions shown · non-contrast
Comparison: 08/09/2017 and 02/19/2017

CLINICAL DATA: Cough and congestion 5 days.

EXAM:
CHEST - 2 VIEW

[chest pa]
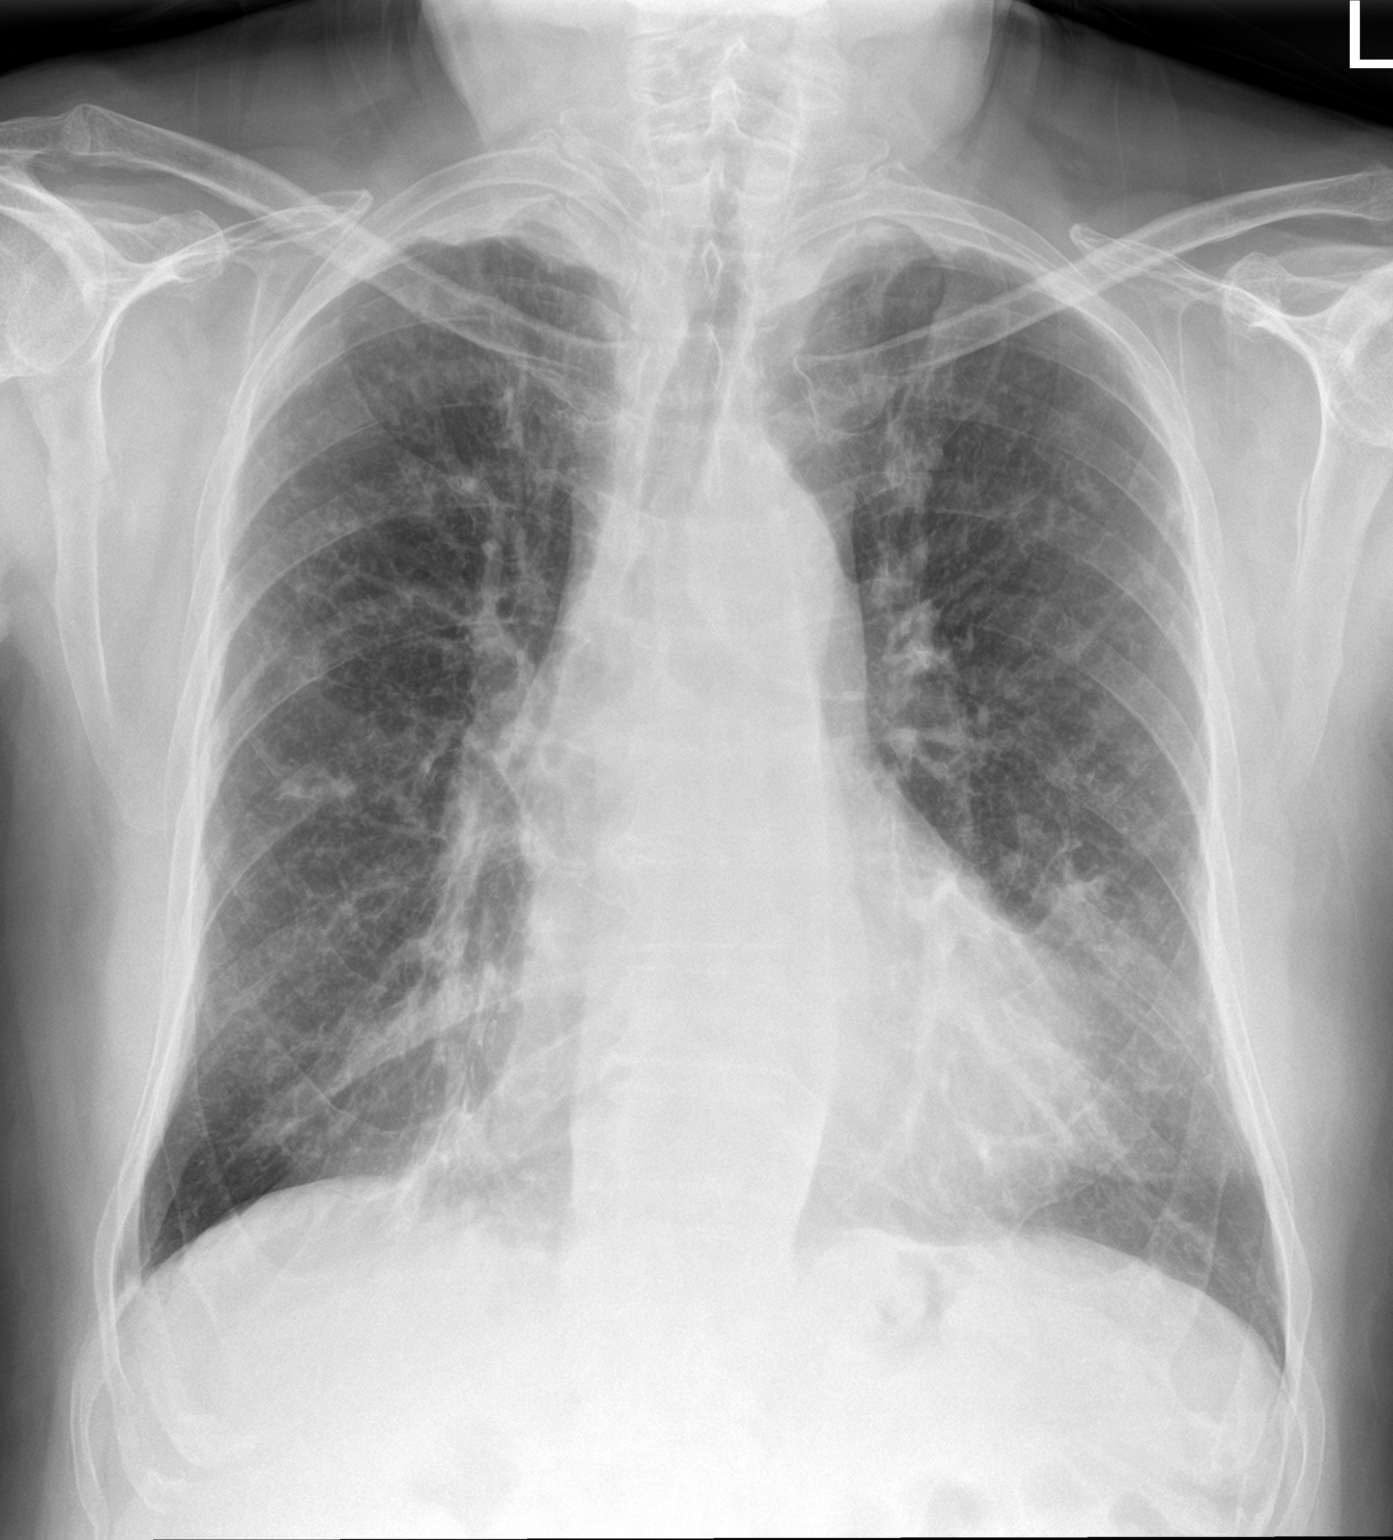

[chest lat]
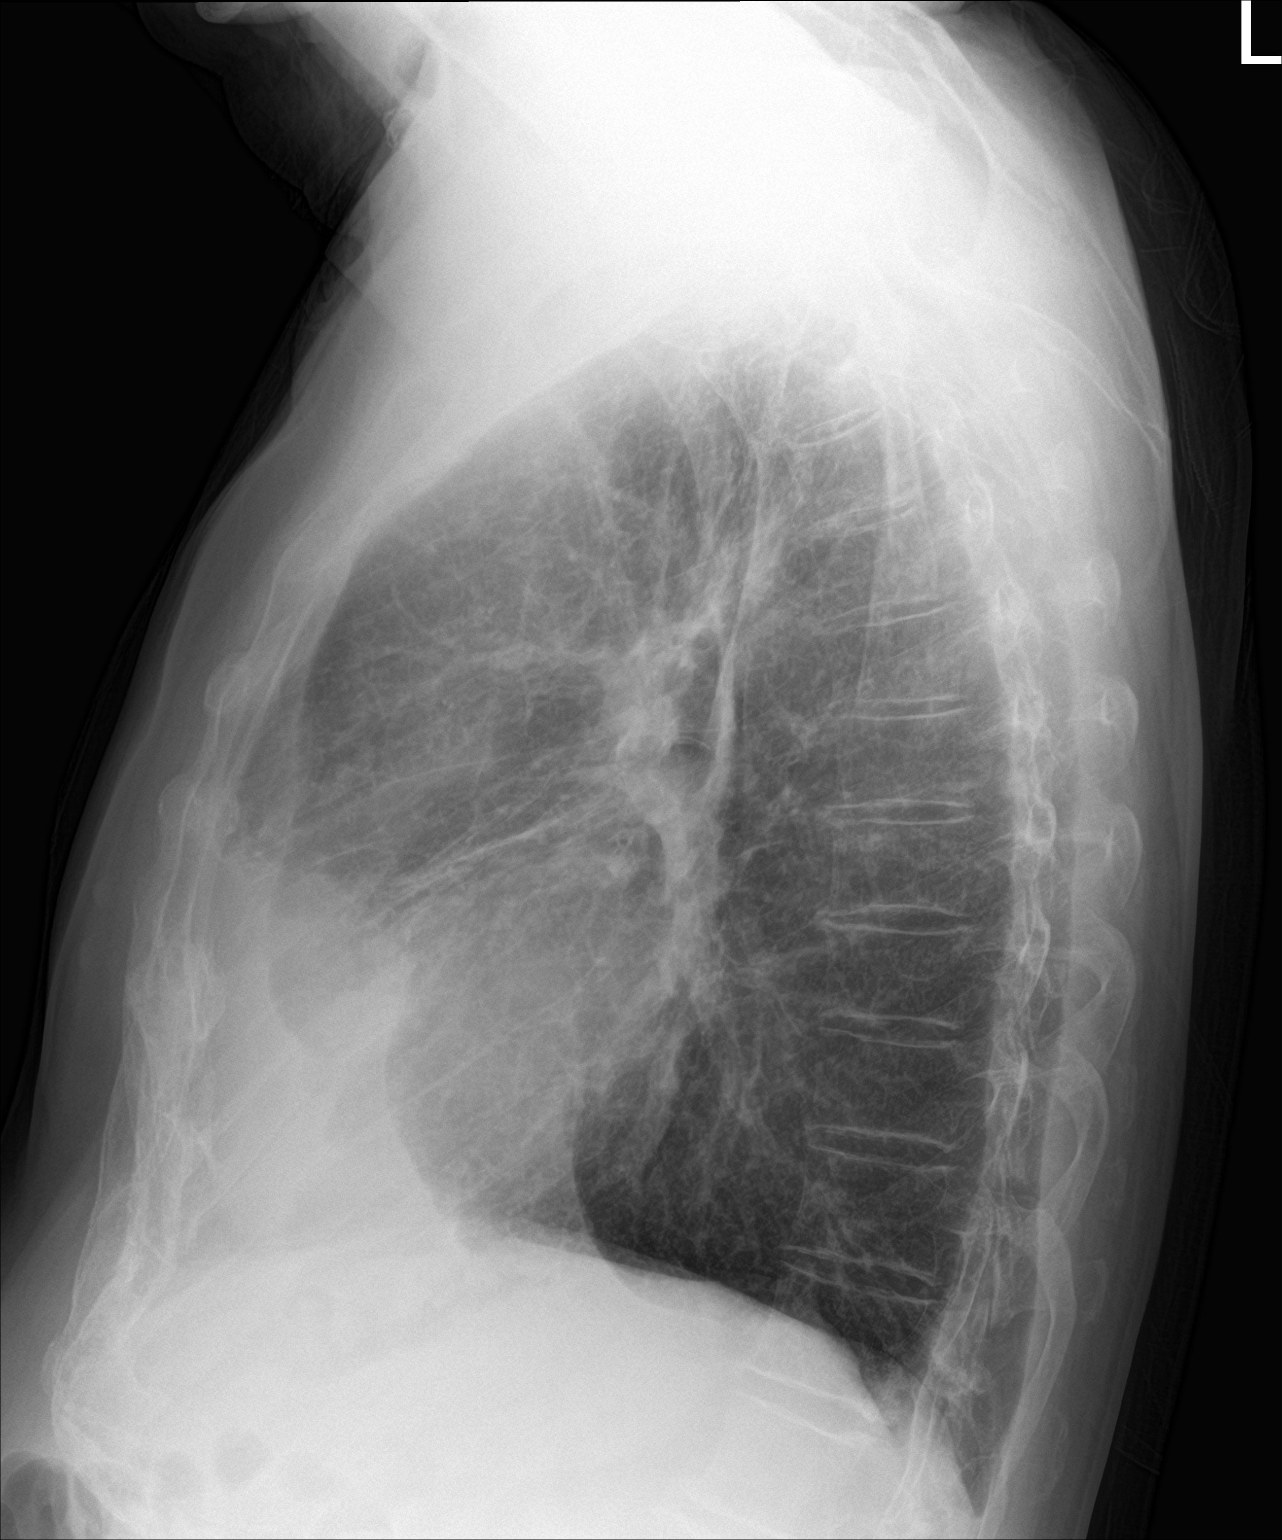

[2 of 2 positions shown; findings below may reference images not displayed]

FINDINGS: Lungs are somewhat hyperexpanded without focal airspace
consolidation or effusion. Stable patchy coarse interstitial
changes. Mild stable cardiomegaly. Remainder of the exam is
unchanged.
IMPRESSION: No acute cardiopulmonary disease.

Stable chronic patchy coarse interstitial changes. Mild stable
cardiomegaly.

## 2020-05-03 NOTE — Progress Notes (Signed)
Cardiology Office Note  Date: 05/04/2020   ID: [facility], DOB 1946/06/24, MRN [id number]  PCP:  [name], MD  Cardiologist:  Rozann Lesches, MD Electrophysiologist:  None   Chief Complaint  Patient presents with   Cardiac follow-up    History of Present Illness: [facility] is a 74 y.o. male last seen in April by Mr. Leonides Sake NP. He presents for a routine visit.  Reports no palpitations, has stable dyspnea on exertion and fatigue.  No exertional chest pain.  He remains on Coumadin with follow-up by PCP.  He does not report any spontaneous bleeding problems.  I reviewed his lab work from July at which point hemoglobin was normal.  I reviewed his medications which are listed below.  He remains on Toprol-XL for heart rate control of atrial fibrillation.  Past Medical History:  Diagnosis Date   Anxiety    Aortic atherosclerosis (HCC)    Atrial fibrillation (HCC)    Basal cell carcinoma    Bladder cancer (HCC)    Bladder tumor    Colon polyps    COPD (chronic obstructive pulmonary disease) (HCC)    Cutaneous vasculitis    Diverticulosis    Essential hypertension    GERD (gastroesophageal reflux disease)    History of pneumonia    Nephrolithiasis    Osteoarthritis     Past Surgical History:  Procedure Laterality Date   COLONOSCOPY     Kidney stones removed  [place] Left    2003   TRANSURETHRAL RESECTION OF BLADDER TUMOR  1996    Current Outpatient Medications  Medication Sig Dispense Refill   Albuterol Sulfate 108 (90 Base) MCG/ACT AEPB Inhale 2 puffs into the lungs every 6 (six) hours as needed. 1 each 3   ALPRAZolam (XANAX) 0.25 MG tablet Take 0.25 mg by mouth 2 (two) times daily as needed for anxiety or sleep.   3   budesonide-formoterol (SYMBICORT) 160-4.5 MCG/ACT inhaler Inhale 2 puffs into the lungs 2 (two) times daily. 1 Inhaler 6   cetirizine (ZYRTEC) 5 MG tablet Take 5 mg by mouth daily.       Cyanocobalamin (VITAMIN B12 PO) Take by mouth.     D3-50 50000 units capsule Take 50,000 Units by mouth once a week. Usually Tuesday or Wednesday.  0   erythromycin ophthalmic ointment Place 1 application into both eyes daily as needed (Infection).     hydroxypropyl methylcellulose / hypromellose (ISOPTO TEARS / GONIOVISC) 2.5 % ophthalmic solution Place 1 drop into both eyes as needed for dry eyes.     levalbuterol (XOPENEX) 0.63 MG/3ML nebulizer solution Take 3 mLs (0.63 mg total) by nebulization every 6 (six) hours as needed for wheezing or shortness of breath. 75 mL 6   losartan-hydrochlorothiazide (HYZAAR) 50-12.5 MG tablet Take 1 tablet by mouth daily.      metoprolol succinate (TOPROL-XL) 50 MG 24 hr tablet Take 1 tablet (50 mg total) by mouth 2 (two) times daily. 60 tablet 0   Multiple Vitamin (MULTIVITAMIN WITH MINERALS) TABS tablet Take 1 tablet by mouth daily.     omeprazole (PRILOSEC) 40 MG capsule Take 40 mg by mouth daily.     predniSONE (DELTASONE) 10 MG tablet Take 10 mg by mouth daily as needed.      triamcinolone (NASACORT) 55 MCG/ACT AERO nasal inhaler Place 1 spray into the nose daily as needed (Congestion).      warfarin (COUMADIN) 2.5 MG tablet Take 2.5 mg by  mouth daily.     zinc gluconate 50 MG tablet Take 50 mg by mouth daily.     No current facility-administered medications for this visit.   Allergies:  Simvastatin, Albuterol, Augmentin [amoxicillin-pot clavulanate], Ciprofloxacin, Contrast media [iodinated diagnostic agents], Flagyl [metronidazole], Latex, and Sulfa antibiotics   ROS:  No syncope.  Physical Exam: VS:  BP 140/80    Pulse 65    Ht 5\' 11"  (1.803 m)    Wt 185 lb (83.9 kg)    SpO2 98%    BMI 25.80 kg/m , BMI Body mass index is 25.8 kg/m.  Wt Readings from Last 3 Encounters:  05/04/20 185 lb (83.9 kg)  10/30/19 184 lb 12.8 oz (83.8 kg)  04/16/19 174 lb (78.9 kg)    General: Patient appears comfortable at rest. HEENT: Conjunctiva and  lids normal, wearing a mask. Neck: Supple, no elevated JVP or carotid bruits, no thyromegaly. Lungs: Coarse breath sounds with scattered rhonchi, nonlabored breathing at rest. Cardiac: Irregularly irregular, no S3 or significant systolic murmur, no pericardial rub. Extremities: No pitting edema.  ECG:  An ECG dated 10/30/2019 was personally reviewed today and demonstrated:  Atrial fibrillation with IVCD, leftward axis, nonspecific ST changes.  Recent Labwork:  July 2021: Hemoglobin 14.6, platelets 355, BUN 18, creatinine 1.03, potassium 4.1, AST 16, ALT 22 TSH 0.53, free T4 1.07  Other Studies Reviewed Today:  Echocardiogram 07/09/2016: - Left ventricle: The cavity size was normal. Wall thickness was  increased in a pattern of mild LVH. Systolic function was  vigorous. The estimated ejection fraction was in the range of 65%  to 70%. Wall motion was normal; there were no regional wall  motion abnormalities. The study is not technically sufficient to  allow evaluation of LV diastolic function.  - Mitral valve: Calcified annulus.  - Left atrium: The atrium was moderately dilated.  - Right atrium: The atrium was moderately dilated.   Assessment and Plan:  1.  Permanent atrial fibrillation.  CHA2DS2-VASc score is 3.  He remains asymptomatic in terms of palpitations, does have dyspnea on exertion.  Plan to continue Toprol-XL which is providing good heart rate control at this time.  He remains on Coumadin for stroke prophylaxis, followed by PCP.  2.  Essential hypertension, recent systolics 076A to 263F.  He also remains on Hyzaar.  No changes were made today.  3.  COPD, continues to follow with [facility].  Medication Adjustments/Labs and Tests Ordered: Current medicines are reviewed at length with the patient today.  Concerns regarding medicines are outlined above.   Tests Ordered: No orders of the defined types were placed in this encounter.   Medication  Changes: No orders of the defined types were placed in this encounter.   Disposition:  Follow up 6 months in the [place] office.  Signed, Satira Sark, MD, [facility] 05/04/2020 9:56 AM    [facility] [place], [place], [place] [address] Phone: [phone]; Fax: [phone]

## 2020-05-04 ENCOUNTER — Encounter: Payer: Self-pay | Admitting: Cardiology

## 2020-05-04 ENCOUNTER — Ambulatory Visit: Payer: Medicare Other | Admitting: Cardiology

## 2020-05-04 VITALS — BP 140/80 | HR 65 | Ht 71.0 in | Wt 185.0 lb

## 2020-05-04 DIAGNOSIS — I4821 Permanent atrial fibrillation: Secondary | ICD-10-CM

## 2020-05-04 DIAGNOSIS — I10 Essential (primary) hypertension: Secondary | ICD-10-CM

## 2020-05-04 NOTE — Patient Instructions (Addendum)

## 2020-05-08 ENCOUNTER — Ambulatory Visit: Payer: Medicare Other | Attending: Internal Medicine

## 2020-05-08 ENCOUNTER — Other Ambulatory Visit: Payer: Self-pay

## 2020-05-08 DIAGNOSIS — Z23 Encounter for immunization: Secondary | ICD-10-CM

## 2020-05-08 NOTE — Progress Notes (Signed)
   Covid-19 Vaccination Clinic  Name:  [facility]    MRN: [id number] DOB: 06-07-1946  05/08/2020  [facility] was observed post Covid-19 immunization for 15 minutes without incident. He was provided with Vaccine Information Sheet and instruction to access the V-Safe system.   [facility] was instructed to call 911 with any severe reactions post vaccine: Marland Kitchen Difficulty breathing  . Swelling of face and throat  . A fast heartbeat  . A bad rash all over body  . Dizziness and weakness

## 2020-10-29 ENCOUNTER — Emergency Department (HOSPITAL_COMMUNITY): Payer: Medicare Other

## 2020-10-29 ENCOUNTER — Other Ambulatory Visit: Payer: Self-pay

## 2020-10-29 ENCOUNTER — Inpatient Hospital Stay (HOSPITAL_COMMUNITY)
Admission: EM | Admit: 2020-10-29 | Discharge: 2020-10-31 | DRG: 177 | Disposition: A | Discharge status: Home / Self Care | Payer: Medicare Other | Source: Ambulatory Visit | Attending: Internal Medicine | Admitting: Internal Medicine

## 2020-10-29 ENCOUNTER — Encounter (HOSPITAL_COMMUNITY): Payer: Self-pay | Admitting: *Deleted

## 2020-10-29 DIAGNOSIS — J441 Chronic obstructive pulmonary disease with (acute) exacerbation: Secondary | ICD-10-CM | POA: Diagnosis present

## 2020-10-29 DIAGNOSIS — I10 Essential (primary) hypertension: Secondary | ICD-10-CM | POA: Diagnosis present

## 2020-10-29 DIAGNOSIS — U071 COVID-19: Principal | ICD-10-CM | POA: Diagnosis present

## 2020-10-29 DIAGNOSIS — I482 Chronic atrial fibrillation, unspecified: Secondary | ICD-10-CM | POA: Diagnosis present

## 2020-10-29 DIAGNOSIS — J1282 Pneumonia due to coronavirus disease 2019: Secondary | ICD-10-CM | POA: Diagnosis present

## 2020-10-29 DIAGNOSIS — Z85828 Personal history of other malignant neoplasm of skin: Secondary | ICD-10-CM | POA: Diagnosis not present

## 2020-10-29 DIAGNOSIS — J9601 Acute respiratory failure with hypoxia: Secondary | ICD-10-CM | POA: Diagnosis present

## 2020-10-29 DIAGNOSIS — E876 Hypokalemia: Secondary | ICD-10-CM | POA: Diagnosis present

## 2020-10-29 DIAGNOSIS — R7881 Bacteremia: Secondary | ICD-10-CM | POA: Diagnosis present

## 2020-10-29 DIAGNOSIS — F419 Anxiety disorder, unspecified: Secondary | ICD-10-CM | POA: Diagnosis present

## 2020-10-29 DIAGNOSIS — Z87891 Personal history of nicotine dependence: Secondary | ICD-10-CM

## 2020-10-29 DIAGNOSIS — Z8551 Personal history of malignant neoplasm of bladder: Secondary | ICD-10-CM | POA: Diagnosis not present

## 2020-10-29 DIAGNOSIS — J440 Chronic obstructive pulmonary disease with acute lower respiratory infection: Secondary | ICD-10-CM | POA: Diagnosis present

## 2020-10-29 DIAGNOSIS — Z7901 Long term (current) use of anticoagulants: Secondary | ICD-10-CM | POA: Diagnosis not present

## 2020-10-29 DIAGNOSIS — R0902 Hypoxemia: Secondary | ICD-10-CM

## 2020-10-29 DIAGNOSIS — L600 Ingrowing nail: Secondary | ICD-10-CM | POA: Diagnosis present

## 2020-10-29 DIAGNOSIS — B965 Pseudomonas (aeruginosa) (mallei) (pseudomallei) as the cause of diseases classified elsewhere: Secondary | ICD-10-CM | POA: Diagnosis present

## 2020-10-29 LAB — COMPREHENSIVE METABOLIC PANEL
ALT: 18 U/L (ref 0–44)
AST: 18 U/L (ref 15–41)
Albumin: 3.3 g/dL — ABNORMAL LOW (ref 3.5–5.0)
Alkaline Phosphatase: 47 U/L (ref 38–126)
Anion gap: 10 (ref 5–15)
BUN: 16 mg/dL (ref 8–23)
CO2: 31 mmol/L (ref 22–32)
Calcium: 8.6 mg/dL — ABNORMAL LOW (ref 8.9–10.3)
Chloride: 95 mmol/L — ABNORMAL LOW (ref 98–111)
Creatinine, Ser: 0.99 mg/dL (ref 0.61–1.24)
GFR, Estimated: >60 mL/min (ref >60)
Glucose, Bld: 104 mg/dL — ABNORMAL HIGH (ref 70–99)
Potassium: 2.9 mmol/L — ABNORMAL LOW (ref 3.5–5.1)
Sodium: 136 mmol/L (ref 135–145)
Total Bilirubin: 1.1 mg/dL (ref 0.3–1.2)
Total Protein: 6.6 g/dL (ref 6.5–8.1)

## 2020-10-29 LAB — URINALYSIS, ROUTINE W REFLEX MICROSCOPIC
Bacteria, UA: NONE SEEN
Bilirubin Urine: NEGATIVE
Glucose, UA: NEGATIVE mg/dL
Ketones, ur: NEGATIVE mg/dL
Leukocytes,Ua: NEGATIVE
Nitrite: NEGATIVE
Protein, ur: NEGATIVE mg/dL
Specific Gravity, Urine: 1.02 (ref 1.005–1.030)
pH: 5 (ref 5.0–8.0)

## 2020-10-29 LAB — CBC WITH DIFFERENTIAL/PLATELET
Abs Immature Granulocytes: 0.16 10*3/uL — ABNORMAL HIGH (ref 0.00–0.07)
Basophils Absolute: 0.1 10*3/uL (ref 0.0–0.1)
Basophils Relative: 1 %
Eosinophils Absolute: 0.1 10*3/uL (ref 0.0–0.5)
Eosinophils Relative: 1 %
HCT: 43.2 % (ref 39.0–52.0)
Hemoglobin: 13.4 g/dL (ref 13.0–17.0)
Immature Granulocytes: 1 %
Lymphocytes Relative: 7 %
Lymphs Abs: 0.8 10*3/uL (ref 0.7–4.0)
MCH: 30.2 pg (ref 26.0–34.0)
MCHC: 31 g/dL (ref 30.0–36.0)
MCV: 97.5 fL (ref 80.0–100.0)
Monocytes Absolute: 2 10*3/uL — ABNORMAL HIGH (ref 0.1–1.0)
Monocytes Relative: 17 %
Neutro Abs: 8.4 10*3/uL — ABNORMAL HIGH (ref 1.7–7.7)
Neutrophils Relative %: 73 %
Platelets: 260 10*3/uL (ref 150–400)
RBC: 4.43 MIL/uL (ref 4.22–5.81)
RDW: 14.4 % (ref 11.5–15.5)
WBC: 11.6 10*3/uL — ABNORMAL HIGH (ref 4.0–10.5)
nRBC: 0 % (ref 0.0–0.2)

## 2020-10-29 LAB — RESP PANEL BY RT-PCR (FLU A&B, COVID) ARPGX2
Influenza A by PCR: NEGATIVE
Influenza B by PCR: NEGATIVE
SARS Coronavirus 2 by RT PCR: POSITIVE — AB

## 2020-10-29 LAB — FERRITIN: Ferritin: 88 ng/mL (ref 24–336)

## 2020-10-29 LAB — LACTIC ACID, PLASMA
Lactic Acid, Venous: 1.1 mmol/L (ref 0.5–1.9)
Lactic Acid, Venous: 1.2 mmol/L (ref 0.5–1.9)

## 2020-10-29 LAB — PROTIME-INR
INR: 2.2 — ABNORMAL HIGH (ref 0.8–1.2)
Prothrombin Time: 24.3 seconds — ABNORMAL HIGH (ref 11.4–15.2)

## 2020-10-29 LAB — MAGNESIUM: Magnesium: 1.5 mg/dL — ABNORMAL LOW (ref 1.7–2.4)

## 2020-10-29 LAB — C-REACTIVE PROTEIN: CRP: 4 mg/dL — ABNORMAL HIGH (ref <1.0)

## 2020-10-29 LAB — LACTATE DEHYDROGENASE: LDH: 163 U/L (ref 98–192)

## 2020-10-29 LAB — FIBRINOGEN: Fibrinogen: 444 mg/dL (ref 210–475)

## 2020-10-29 LAB — PROCALCITONIN: Procalcitonin: <0.1 ng/mL

## 2020-10-29 LAB — TRIGLYCERIDES: Triglycerides: 75 mg/dL (ref <150)

## 2020-10-29 LAB — D-DIMER, QUANTITATIVE: D-Dimer, Quant: 0.27 ug/mL-FEU (ref 0.00–0.50)

## 2020-10-29 MED ORDER — GUAIFENESIN-DM 100-10 MG/5ML PO SYRP: 10.0000 mL | ORAL_SOLUTION | ORAL | Status: DC | PRN

## 2020-10-29 MED ORDER — ALPRAZOLAM 0.25 MG PO TABS
0.2500 mg | ORAL_TABLET | Freq: Two times a day (BID) | ORAL | Status: DC | PRN
  Administered 2020-10-31: 0.25 mg via ORAL
  Filled 2020-10-29: qty 1

## 2020-10-29 MED ORDER — MAGNESIUM SULFATE 50 % IJ SOLN: 2.0000 g | Freq: Once | INTRAMUSCULAR | Status: DC

## 2020-10-29 MED ORDER — HYDROCOD POLST-CPM POLST ER 10-8 MG/5ML PO SUER
5.0000 mL | Freq: Two times a day (BID) | ORAL | Status: DC | PRN
Start: 2020-10-29 — End: 2020-10-31

## 2020-10-29 MED ORDER — POTASSIUM CHLORIDE 20 MEQ PO PACK
60.0000 meq | PACK | Freq: Once | ORAL | Status: AC
  Administered 2020-10-29: 60 meq via ORAL
  Filled 2020-10-29: qty 3

## 2020-10-29 MED ORDER — POLYVINYL ALCOHOL 1.4 % OP SOLN: 1.0000 [drp] | OPHTHALMIC | Status: DC | PRN

## 2020-10-29 MED ORDER — POLYETHYLENE GLYCOL 3350 17 G PO PACK: 17.0000 g | PACK | Freq: Every day | ORAL | Status: DC | PRN

## 2020-10-29 MED ORDER — METOPROLOL SUCCINATE ER 50 MG PO TB24
50.0000 mg | ORAL_TABLET | Freq: Two times a day (BID) | ORAL | Status: DC
  Administered 2020-10-29 – 2020-10-31 (×4): 50 mg via ORAL
  Filled 2020-10-29 (×4): qty 1

## 2020-10-29 MED ORDER — SODIUM CHLORIDE 0.9 % IV SOLN: 200.0000 mg | Freq: Once | INTRAVENOUS | Status: DC

## 2020-10-29 MED ORDER — MAGNESIUM SULFATE 2 GM/50ML IV SOLN
2.0000 g | Freq: Once | INTRAVENOUS | Status: AC
  Administered 2020-10-29: 2 g via INTRAVENOUS
  Filled 2020-10-29: qty 50

## 2020-10-29 MED ORDER — SODIUM CHLORIDE 0.9 % IV SOLN: 1.0000 g | Freq: Once | INTRAVENOUS | Status: DC

## 2020-10-29 MED ORDER — DEXAMETHASONE 4 MG PO TABS: 6.0000 mg | ORAL_TABLET | ORAL | Status: DC

## 2020-10-29 MED ORDER — ACETAMINOPHEN 500 MG PO TABS
1000.0000 mg | ORAL_TABLET | Freq: Once | ORAL | Status: AC
  Administered 2020-10-29: 1000 mg via ORAL
  Filled 2020-10-29: qty 2

## 2020-10-29 MED ORDER — LOSARTAN POTASSIUM 50 MG PO TABS: 50.0000 mg | ORAL_TABLET | Freq: Every day | ORAL | Status: DC

## 2020-10-29 MED ORDER — WARFARIN SODIUM 2.5 MG PO TABS
2.5000 mg | ORAL_TABLET | Freq: Once | ORAL | Status: AC
  Administered 2020-10-29: 2.5 mg via ORAL
  Filled 2020-10-29: qty 1

## 2020-10-29 MED ORDER — WARFARIN - PHARMACIST DOSING INPATIENT: Freq: Every day | Status: DC

## 2020-10-29 MED ORDER — ONDANSETRON HCL 4 MG/2ML IJ SOLN: 4.0000 mg | Freq: Four times a day (QID) | INTRAMUSCULAR | Status: DC | PRN

## 2020-10-29 MED ORDER — NEOMYCIN-POLYMYXIN-DEXAMETH 3.5-10000-0.1 OP OINT
1.0000 "application " | TOPICAL_OINTMENT | Freq: Every day | OPHTHALMIC | Status: DC
  Filled 2020-10-29: qty 3.5

## 2020-10-29 MED ORDER — WARFARIN SODIUM 2.5 MG PO TABS: 2.5000 mg | ORAL_TABLET | Freq: Every day | ORAL | Status: DC

## 2020-10-29 MED ORDER — POTASSIUM CHLORIDE CRYS ER 20 MEQ PO TBCR
20.0000 meq | EXTENDED_RELEASE_TABLET | Freq: Two times a day (BID) | ORAL | Status: DC
  Administered 2020-10-29 – 2020-10-31 (×4): 20 meq via ORAL
  Filled 2020-10-29 (×4): qty 1

## 2020-10-29 MED ORDER — TRIAMCINOLONE ACETONIDE 55 MCG/ACT NA AERO
1.0000 | INHALATION_SPRAY | Freq: Every day | NASAL | Status: DC | PRN
  Filled 2020-10-29: qty 10.8

## 2020-10-29 MED ORDER — FLUTICASONE FUROATE-VILANTEROL 200-25 MCG/INH IN AEPB
1.0000 | INHALATION_SPRAY | Freq: Every day | RESPIRATORY_TRACT | Status: DC
  Administered 2020-10-30 – 2020-10-31 (×2): 1 via RESPIRATORY_TRACT
  Filled 2020-10-29: qty 28

## 2020-10-29 MED ORDER — LOSARTAN POTASSIUM-HCTZ 50-12.5 MG PO TABS: 1.0000 | ORAL_TABLET | Freq: Every day | ORAL | Status: DC

## 2020-10-29 MED ORDER — ZINC SULFATE 220 (50 ZN) MG PO CAPS
220.0000 mg | ORAL_CAPSULE | Freq: Every day | ORAL | Status: DC
  Administered 2020-10-30 – 2020-10-31 (×2): 220 mg via ORAL
  Filled 2020-10-29 (×2): qty 1

## 2020-10-29 MED ORDER — HYDROCHLOROTHIAZIDE 12.5 MG PO CAPS: 12.5000 mg | ORAL_CAPSULE | Freq: Every day | ORAL | Status: DC

## 2020-10-29 MED ORDER — DEXAMETHASONE SODIUM PHOSPHATE 10 MG/ML IJ SOLN
10.0000 mg | Freq: Once | INTRAMUSCULAR | Status: AC
  Administered 2020-10-29: 10 mg via INTRAVENOUS
  Filled 2020-10-29: qty 1

## 2020-10-29 MED ORDER — LORATADINE 10 MG PO TABS
10.0000 mg | ORAL_TABLET | Freq: Every day | ORAL | Status: DC
  Administered 2020-10-30 – 2020-10-31 (×2): 10 mg via ORAL
  Filled 2020-10-29 (×2): qty 1

## 2020-10-29 MED ORDER — SODIUM CHLORIDE 0.9 % IV SOLN
100.0000 mg | INTRAVENOUS | Status: AC
  Administered 2020-10-29 (×2): 100 mg via INTRAVENOUS
  Filled 2020-10-29 (×2): qty 20

## 2020-10-29 MED ORDER — SODIUM CHLORIDE 0.9 % IV SOLN: 500.0000 mg | Freq: Once | INTRAVENOUS | Status: DC

## 2020-10-29 MED ORDER — ONDANSETRON HCL 4 MG PO TABS: 4.0000 mg | ORAL_TABLET | Freq: Four times a day (QID) | ORAL | Status: DC | PRN

## 2020-10-29 MED ORDER — SODIUM CHLORIDE 0.9 % IV SOLN
100.0000 mg | Freq: Every day | INTRAVENOUS | Status: DC
  Administered 2020-10-30 – 2020-10-31 (×2): 100 mg via INTRAVENOUS
  Filled 2020-10-29 (×4): qty 20

## 2020-10-29 MED ORDER — SODIUM CHLORIDE 0.9 % IV SOLN: 100.0000 mg | Freq: Every day | INTRAVENOUS | Status: DC

## 2020-10-29 MED ORDER — ASCORBIC ACID 500 MG PO TABS
500.0000 mg | ORAL_TABLET | Freq: Every day | ORAL | Status: DC
  Administered 2020-10-29 – 2020-10-31 (×3): 500 mg via ORAL
  Filled 2020-10-29 (×3): qty 1

## 2020-10-29 MED ORDER — ALBUTEROL SULFATE HFA 108 (90 BASE) MCG/ACT IN AERS: 2.0000 | INHALATION_SPRAY | Freq: Four times a day (QID) | RESPIRATORY_TRACT | Status: DC | PRN

## 2020-10-29 MED ORDER — PANTOPRAZOLE SODIUM 40 MG PO TBEC
40.0000 mg | DELAYED_RELEASE_TABLET | Freq: Every day | ORAL | Status: DC
  Administered 2020-10-30 – 2020-10-31 (×2): 40 mg via ORAL
  Filled 2020-10-29 (×2): qty 1

## 2020-10-29 NOTE — H&P (Signed)
History and Physical  Matthew Hall QTM:[id number] DOB: 10-03-1945 DOA: 10/29/2020  Referring physician: Rayna Sexton, PA-C, EDP PCP: [name], MD  Outpatient Specialists:   Patient Coming From: Home  Chief Complaint: Fever, shortness of breath  HPI: Matthew Hall is a 75 y.o. male with a history of COPD, atrial fibrillation, history of bladder cancer, hypertension, GERD.  Patient seen for fever and dyspnea on exertion.  Symptoms started this morning he was seen by podiatry due to an ingrown toenail, which she had removed.  While he was there patient had a fever of 100.8.  The staff tested him for COVID and discovered that he was positive.  He does note mild dyspnea on exertion which improves with rest.  No other provoking factors.  He has received 3 doses of the COVID-vaccine.  Mild cough for 1 day with production.  Does feel fatigued, but no particular shortness of breath or chest pain.  The patient does have a prescription for prednisone as needed for Pain.  Patient has arthritis in his child occasionally needs prednisone to help decrease pain and inflammation.  Patient has taken it over the past couple of days due to pain in relation to his ingrown toenail.  Emergency Department Course: COVID test positive.  White count 11.6.  Patient noted to be hypoxic to mid 80s with ambulation.  He did have a fever while here, which improved with Tylenol.  Review of Systems:   Pt denies any chills, nausea, vomiting, diarrhea, constipation, abdominal pain, shortness of breath, orthopnea, cough, wheezing, palpitations, headache, vision changes, lightheadedness, dizziness, melena, rectal bleeding.  Review of systems are otherwise negative  Past Medical History:  Diagnosis Date  . Anxiety   . Aortic atherosclerosis ([place])   . Atrial fibrillation ([place])   . Basal cell carcinoma   . Bladder cancer ([place])   . Bladder tumor   . Colon polyps   . COPD (chronic obstructive pulmonary  disease) ([place])   . Cutaneous vasculitis   . Diverticulosis   . Essential hypertension   . GERD (gastroesophageal reflux disease)   . History of pneumonia   . Nephrolithiasis   . Osteoarthritis    Past Surgical History:  Procedure Laterality Date  . COLONOSCOPY    . Kidney stones removed  1996  . SKIN CANCER EXCISION Left    2003  . TRANSURETHRAL RESECTION OF BLADDER TUMOR  1996   Social History:  reports that he quit smoking about 9 years ago. His smoking use included cigarettes. He has a 30.00 pack-year smoking history. He has never used smokeless tobacco. He reports that he does not drink alcohol and does not use drugs. Patient lives at home  Allergies  Allergen Reactions  . Simvastatin Rash  . Albuterol Other (See Comments)    Increases heartrate!!!   . Augmentin [Amoxicillin-Pot Clavulanate] Rash  . Ciprofloxacin Rash  . Contrast Media [Iodinated Diagnostic Agents] Rash  . Flagyl [Metronidazole] Rash  . Latex Hives and Rash  . Sulfa Antibiotics Rash    Family History  Problem Relation Age of Onset  . Emphysema Father   . Lung cancer Brother   . Liver cancer Brother   . Diabetes Mother   . CAD Mother   . Diabetes Sister   . Stroke Sister   . Colon cancer Neg Hx       Prior to Admission medications   Medication Sig Start Date End Date Taking? Authorizing Provider  ALPRAZolam Duanne Moron) 0.25 MG tablet Take 0.25 mg  by mouth 2 (two) times daily as needed for anxiety or sleep.  07/01/14  Yes [provider]  budesonide-formoterol (SYMBICORT) 160-4.5 MCG/ACT inhaler Inhale 2 puffs into the lungs 2 (two) times daily. 12/01/14  Yes Rigoberto Noel, MD  cetirizine (ZYRTEC) 5 MG tablet Take 5 mg by mouth daily.   Yes [provider]  Cyanocobalamin (VITAMIN B12 PO) Take 1 tablet by mouth daily.   Yes [provider]  D3-50 50000 units capsule Take 50,000 Units by mouth once a week. Usually Tuesday or Wednesday. 11/23/17  Yes [provider]   levalbuterol (XOPENEX) 0.63 MG/3ML nebulizer solution Take 3 mLs (0.63 mg total) by nebulization every 6 (six) hours as needed for wheezing or shortness of breath. 07/25/19  Yes Lauraine Rinne, NP  losartan-hydrochlorothiazide (HYZAAR) 50-12.5 MG tablet Take 1 tablet by mouth daily.    Yes [provider]  metoprolol succinate (TOPROL-XL) 50 MG 24 hr tablet Take 1 tablet (50 mg total) by mouth 2 (two) times daily. 07/11/16  Yes Thurnell Lose, MD  neomycin-polymyxin b-dexamethasone (MAXITROL) [id number] OINT 1 application at bedtime. 06/24/20  Yes [provider]  omeprazole (PRILOSEC) 40 MG capsule Take 40 mg by mouth daily.   Yes [provider]  predniSONE (DELTASONE) 10 MG tablet Take 10 mg by mouth daily as needed.  10/19/19  Yes [provider]  triamcinolone (NASACORT) 55 MCG/ACT AERO nasal inhaler Place 1 spray into the nose daily as needed (Congestion).    Yes [provider]  warfarin (COUMADIN) 2.5 MG tablet Take 2.5 mg by mouth daily.   Yes [provider]  zinc gluconate 50 MG tablet Take 50 mg by mouth daily.   Yes [provider]  Albuterol Sulfate 108 (90 Base) MCG/ACT AEPB Inhale 2 puffs into the lungs every 6 (six) hours as needed. Patient not taking: No sig reported 08/01/18   Rigoberto Noel, MD  erythromycin ophthalmic ointment Place 1 application into both eyes daily as needed (Infection). Patient not taking: No sig reported    [provider]  hydroxypropyl methylcellulose / hypromellose (ISOPTO TEARS / GONIOVISC) 2.5 % ophthalmic solution Place 1 drop into both eyes as needed for dry eyes. Patient not taking: Reported on 10/29/2020    [provider]  Multiple Vitamin (MULTIVITAMIN WITH MINERALS) TABS tablet Take 1 tablet by mouth daily. Patient not taking: Reported on 10/29/2020    [provider]    Physical Exam: BP 117/63   Pulse 83   Temp (!) 100.9 F (38.3 C) (Oral)   Resp  (!) 28   Ht 5\' 11"  (1.803 m)   Wt 82.6 kg   SpO2 99%   BMI 25.38 kg/m   . General: Elderly male. Awake and alert and oriented x3. No acute cardiopulmonary distress.  Marland Kitchen HEENT: Normocephalic atraumatic.  Right and left ears normal in appearance.  Pupils equal, round, reactive to light. Extraocular muscles are intact. Sclerae anicteric and noninjected.  Moist mucosal membranes. No mucosal lesions.  . Neck: Neck supple without lymphadenopathy. No carotid bruits. No masses palpated.  . Cardiovascular: Regular rate with normal S1-S2 sounds. No murmurs, rubs, gallops auscultated. No JVD.  Marland Kitchen Respiratory: Mild rales throughout.  No accessory muscle use. . Abdomen: Soft, nontender, nondistended. Active bowel sounds. No masses or hepatosplenomegaly  . Skin: No rashes, lesions, or ulcerations.  Dry, warm to touch. 2+ dorsalis pedis and radial pulses. . Musculoskeletal: No calf or leg pain. All major joints not erythematous  nontender.  No upper or lower joint deformation.  Good ROM.  No contractures  . Psychiatric: Intact judgment and insight. Pleasant and cooperative. . Neurologic: No focal neurological deficits. Strength is 5/5 and symmetric in upper and lower extremities.  Cranial nerves II through XII are grossly intact.           Labs on Admission: I have personally reviewed following labs and imaging studies  CBC: Recent Labs  Lab 10/29/20 1330  WBC 11.6*  NEUTROABS 8.4*  HGB 13.4  HCT 43.2  MCV 97.5  PLT 123456   Basic Metabolic Panel: Recent Labs  Lab 10/29/20 1330  NA 136  K 2.9*  CL 95*  CO2 31  GLUCOSE 104*  BUN 16  CREATININE 0.99  CALCIUM 8.6*   GFR: Estimated Creatinine Clearance: 67.6 mL/min (by C-G formula based on SCr of 0.99 mg/dL). Liver Function Tests: Recent Labs  Lab 10/29/20 1330  AST 18  ALT 18  ALKPHOS 47  BILITOT 1.1  PROT 6.6  ALBUMIN 3.3*   No results for input(s): LIPASE, AMYLASE in the last 168 hours. No results for input(s): AMMONIA in the  last 168 hours. Coagulation Profile: No results for input(s): INR, PROTIME in the last 168 hours. Cardiac Enzymes: No results for input(s): CKTOTAL, CKMB, CKMBINDEX, TROPONINI in the last 168 hours. BNP (last 3 results) No results for input(s): PROBNP in the last 8760 hours. HbA1C: No results for input(s): HGBA1C in the last 72 hours. CBG: No results for input(s): GLUCAP in the last 168 hours. Lipid Profile: No results for input(s): CHOL, HDL, LDLCALC, TRIG, CHOLHDL, LDLDIRECT in the last 72 hours. Thyroid Function Tests: No results for input(s): TSH, T4TOTAL, FREET4, T3FREE, THYROIDAB in the last 72 hours. Anemia Panel: No results for input(s): VITAMINB12, FOLATE, FERRITIN, TIBC, IRON, RETICCTPCT in the last 72 hours. Urine analysis:    Component Value Date/Time   COLORURINE YELLOW 10/29/2020 1410   APPEARANCEUR CLEAR 10/29/2020 1410   LABSPEC 1.020 10/29/2020 1410   PHURINE 5.0 10/29/2020 1410   GLUCOSEU NEGATIVE 10/29/2020 1410   HGBUR SMALL (A) 10/29/2020 1410   BILIRUBINUR NEGATIVE 10/29/2020 1410   KETONESUR NEGATIVE 10/29/2020 1410   PROTEINUR NEGATIVE 10/29/2020 1410   NITRITE NEGATIVE 10/29/2020 1410   LEUKOCYTESUR NEGATIVE 10/29/2020 1410   Sepsis Labs: @LABRCNTIP (procalcitonin:4,lacticidven:4) ) Recent Results (from the past 240 hour(s))  Resp Panel by RT-PCR (Flu A&B, Covid)     Status: Abnormal   Collection Time: 10/29/20  2:10 PM   Specimen: Nasopharyngeal(NP) swabs in vial transport medium  Result Value Ref Range Status   SARS Coronavirus 2 by RT PCR POSITIVE (A) NEGATIVE Final    Comment: CRITICAL RESULT CALLED TO, READ BACK BY AND VERIFIED WITH: CRABTREE,B 1538 10/29/2020 COLEMAN,R (NOTE) SARS-CoV-2 target nucleic acids are DETECTED.  The SARS-CoV-2 RNA is generally detectable in upper respiratory specimens during the acute phase of infection. Positive results are indicative of the presence of the identified virus, but do not rule out bacterial  infection or co-infection with other pathogens not detected by the test. Clinical correlation with patient history and other diagnostic information is necessary to determine patient infection status. The expected result is Negative.  Fact Sheet for Patients: EntrepreneurPulse.com.au  Fact Sheet for Healthcare Providers: IncredibleEmployment.be  This test is not yet approved or cleared by the [place] FDA and  has been authorized for detection and/or diagnosis of SARS-CoV-2 by FDA under an Emergency Use Authorization (EUA).  This EUA will remain in effect (meaning this  test can be used) for the duration of  the COVID-19 declaration under Section 564(b)(1) of the Act, 21 U.S.C. section 360bbb-3(b)(1), unless the authorization is terminated or revoked sooner.     Influenza A by PCR NEGATIVE NEGATIVE Final   Influenza B by PCR NEGATIVE NEGATIVE Final    Comment: (NOTE) The Xpert Xpress SARS-CoV-2/FLU/RSV plus assay is intended as an aid in the diagnosis of influenza from Nasopharyngeal swab specimens and should not be used as a sole basis for treatment. Nasal washings and aspirates are unacceptable for Xpert Xpress SARS-CoV-2/FLU/RSV testing.  Fact Sheet for Patients: EntrepreneurPulse.com.au  Fact Sheet for Healthcare Providers: IncredibleEmployment.be  This test is not yet approved or cleared by the [place] FDA and has been authorized for detection and/or diagnosis of SARS-CoV-2 by FDA under an Emergency Use Authorization (EUA). This EUA will remain in effect (meaning this test can be used) for the duration of the COVID-19 declaration under Section 564(b)(1) of the Act, 21 U.S.C. section 360bbb-3(b)(1), unless the authorization is terminated or revoked.  Performed at [facility], [address], [place], [place] [address]      Radiological Exams on Admission: DG Chest Portable 1  View  Result Date: 10/29/2020 CLINICAL DATA:  COVID 19 positive EXAM: PORTABLE CHEST 1 VIEW COMPARISON:  11/25/2017, 08/09/2017 FINDINGS: Stable cardiomegaly. Chronically coarsened interstitial markings throughout both lungs. Unchanged irregular opacity in the left mid lung which may reflect an area of scarring. No definite superimposed airspace opacity. No large pleural fluid collection. No pneumothorax. IMPRESSION: Chronically interstitial lung changes with stable irregular opacity in the left mid lung. A superimposed infectious process would be difficult to exclude. Electronically Signed   By: Davina Poke D.O.   On: 10/29/2020 14:49    EKG: Independently reviewed.  A. fib with PVCs.  Intraventricular conduction.  Possible anterior old infarct.  No acute ST changes   Assessment/Plan: Active Problems:   Essential hypertension   COPD exacerbation (HCC)   Atrial fibrillation, chronic (HCC)   Acute respiratory failure with hypoxia (HCC)   Pneumonia due to COVID-19 virus    This patient was discussed with the ED physician, including pertinent vitals, physical exam findings, labs, and imaging.  We also discussed care given by the ED provider.  1. Acute respiratory failure with hypoxia secondary to COVID-pneumonia Daily labs: CBC, CMP, CRP, D-dimer, ferritin, magnesium, phosphorus Discussed use of steroids: Solu-Medrol, remdesivir Proning Supplemental oxygen Zinc and vitamin C Albuterol HFA as needed Antitussives We will check blood counts.  His and his white count slightly elevated.   We will hold off on empiric treatment with antibiotics and wait for procalcitonin.  If grossly calcitonin is elevated, then will treat with Rocephin and azithromycin 2. COPD exacerbation a. Daily steroids b. Albuterol treatments 3. Chronic atrial fibrillation a. Rate controlled 4. Hypertension a. Continue antihypertensives  DVT prophylaxis: On Coumadin Consultants: None Code Status: Full  code Family Communication: None Disposition Plan: Patient should be able to return home following improvement of respiratory status   Truett Mainland, DO

## 2020-10-29 NOTE — ED Triage Notes (Signed)
Pt brought in by [facility] from foot doctor; pt went to foot doctor to have ingrown toenail removed; while there pt had a temp of 100.8 and the staff tested pt and he is covid positive; the doctor decided to send pt to be seen to be evaluated for his covid status; pt is c/o some weakness and loss of appetite x one day; pt states he has been coughing up grayish, yellow discharge x one day; pt denies any increase sob with exertion

## 2020-10-29 NOTE — ED Notes (Signed)
Pt walked around room, O2 sats maintained at 91, when pt got back in bed sats decreased to 86% but increased up to 93% in less than 3 minutes; pt denied any dizziness or sob while walking

## 2020-10-29 NOTE — ED Notes (Signed)
Dr. Gilford Raid stated pt could eat, pt provided sandwich and diet ginger ale

## 2020-10-29 NOTE — Progress Notes (Addendum)
ANTICOAGULATION CONSULT NOTE - Initial Consult  Pharmacy Consult for warfarin Indication: atrial fibrillation  Allergies  Allergen Reactions  . Simvastatin Rash  . Albuterol Other (See Comments)    Increases heartrate!!!   . Augmentin [Amoxicillin-Pot Clavulanate] Rash  . Ciprofloxacin Rash  . Contrast Media [Iodinated Diagnostic Agents] Rash  . Flagyl [Metronidazole] Rash  . Latex Hives and Rash  . Sulfa Antibiotics Rash    Patient Measurements: Height: 5\' 11"  (180.3 cm) Weight: 82.6 kg (182 lb) IBW/kg (Calculated) : 75.3 Heparin Dosing Weight:   Vital Signs: Temp: 100.9 F (38.3 C) (04/22 1608) Temp Source: Oral (04/22 1608) BP: 117/63 (04/22 1608) Pulse Rate: 83 (04/22 1608)  Labs: Recent Labs    10/29/20 1330  HGB 13.4  HCT 43.2  PLT 260  CREATININE 0.99    Estimated Creatinine Clearance: 67.6 mL/min (by C-G formula based on SCr of 0.99 mg/dL).   Medical History: Past Medical History:  Diagnosis Date  . Anxiety   . Aortic atherosclerosis ([place])   . Atrial fibrillation ([place])   . Basal cell carcinoma   . Bladder cancer ([place])   . Bladder tumor   . Colon polyps   . COPD (chronic obstructive pulmonary disease) ([place])   . Cutaneous vasculitis   . Diverticulosis   . Essential hypertension   . GERD (gastroesophageal reflux disease)   . History of pneumonia   . Nephrolithiasis   . Osteoarthritis     Medications:  (Not in a hospital admission)   Assessment: Pharmacy consulted to dose warfarin in patient with atrial fibrillation.  Patient's INR on admission 2.2.   Home dose listed at 2.5 mg daily with last dose given 4/21 @ 2200.  Goal of Therapy:  INR 2-3 Monitor platelets by anticoagulation protocol: Yes   Plan:  Warfarin 2.5 mg x 1 dose. Monitor daily INR and s/s of bleeding.  Ramond Craver 10/29/2020,4:49 PM

## 2020-10-29 NOTE — ED Provider Notes (Signed)
[facility] EMERGENCY DEPARTMENT Provider Note   CSN: [id number] Arrival date & time: 10/29/20  1230     History Chief Complaint  Patient presents with  . Fever    Matthew Hall is a 75 y.o. male.  HPI Patient is a 75 year old male with a history of atrial fibrillation on Coumadin, COPD, hypertension, who presents the emergency department due to cough.  States his symptoms started yesterday.  Reports associated gray/yellow sputum.  Reports associated decreased appetite, weakness, fatigue, congestion, waxing and waning fevers.  T-max 100.8 F this morning.  Patient states he had an ingrown toenail and was being evaluated by his podiatrist this morning and they found that he was febrile and tested him for COVID-19 and he was found to be positive.  Given his medical history, he was then sent to the emergency department for further evaluation.  Denies any chest pain, acute shortness of breath, abdominal pain, nausea, or vomiting.  Pain confirms his history of COPD and denies any home O2 requirement.  No history of COVID-19.  Vaccinated for COVID-19 x3.    Past Medical History:  Diagnosis Date  . Anxiety   . Aortic atherosclerosis ([place])   . Atrial fibrillation ([place])   . Basal cell carcinoma   . Bladder cancer ([place])   . Bladder tumor   . Colon polyps   . COPD (chronic obstructive pulmonary disease) ([place])   . Cutaneous vasculitis   . Diverticulosis   . Essential hypertension   . GERD (gastroesophageal reflux disease)   . History of pneumonia   . Nephrolithiasis   . Osteoarthritis     Patient Active Problem List   Diagnosis Date Noted  . Healthcare maintenance 07/25/2019  . Leucocytosis 08/11/2017  . Acute respiratory failure with hypoxia ([place]) 08/11/2017  . Elevated sed rate 05/21/2017  . COPD without exacerbation ([place]) 03/01/2017  . 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 Atrial fibrillation, chronic ([place]) 07/09/2016  . BRONCHIECTASIS W/O ACUTE EXACERBATION 03/31/2008   . Essential hypertension 09/13/2007  . Allergic rhinitis 09/13/2007  . EMPHYSEMA 09/13/2007  . COPD exacerbation ([place]) 09/13/2007    Past Surgical History:  Procedure Laterality Date  . COLONOSCOPY    . Kidney stones removed  1996  . SKIN CANCER EXCISION Left    2003  . TRANSURETHRAL RESECTION OF BLADDER TUMOR  1996       Family History  Problem Relation Age of Onset  . Emphysema Father   . Lung cancer Brother   . Liver cancer Brother   . Diabetes Mother   . CAD Mother   . Diabetes Sister   . Stroke Sister   . Colon cancer Neg Hx     Social History   Tobacco Use  . Smoking status: Former Smoker    Packs/day: 1.00    Years: 30.00    Pack years: 30.00    Types: Cigarettes    Quit date: 07/07/2011    Years since quitting: 9.3  . Smokeless tobacco: Never Used  Vaping Use  . Vaping Use: Never used  Substance Use Topics  . Alcohol use: No    Alcohol/week: 0.0 standard drinks  . Drug use: No    Home Medications Prior to Admission medications   Medication Sig Start Date End Date Taking? Authorizing Provider  Albuterol Sulfate 108 (90 Base) MCG/ACT AEPB Inhale 2 puffs into the lungs every 6 (six) hours as needed. 08/01/18   Rigoberto Noel, MD  ALPRAZolam Duanne Moron) 0.25 MG tablet Take 0.25 mg  by mouth 2 (two) times daily as needed for anxiety or sleep.  07/01/14   [provider]  budesonide-formoterol (SYMBICORT) 160-4.5 MCG/ACT inhaler Inhale 2 puffs into the lungs 2 (two) times daily. 12/01/14   Rigoberto Noel, MD  cetirizine (ZYRTEC) 5 MG tablet Take 5 mg by mouth daily.    [provider]  Cyanocobalamin (VITAMIN B12 PO) Take by mouth.    [provider]  D3-50 50000 units capsule Take 50,000 Units by mouth once a week. Usually Tuesday or Wednesday. 11/23/17   [provider]  erythromycin ophthalmic ointment Place 1 application into both eyes daily as needed (Infection).    [provider]  hydroxypropyl methylcellulose /  hypromellose (ISOPTO TEARS / GONIOVISC) 2.5 % ophthalmic solution Place 1 drop into both eyes as needed for dry eyes.    [provider]  levalbuterol Penne Lash) 0.63 MG/3ML nebulizer solution Take 3 mLs (0.63 mg total) by nebulization every 6 (six) hours as needed for wheezing or shortness of breath. 07/25/19   Lauraine Rinne, NP  losartan-hydrochlorothiazide (HYZAAR) 50-12.5 MG tablet Take 1 tablet by mouth daily.     [provider]  metoprolol succinate (TOPROL-XL) 50 MG 24 hr tablet Take 1 tablet (50 mg total) by mouth 2 (two) times daily. 07/11/16   Thurnell Lose, MD  Multiple Vitamin (MULTIVITAMIN WITH MINERALS) TABS tablet Take 1 tablet by mouth daily.    [provider]  omeprazole (PRILOSEC) 40 MG capsule Take 40 mg by mouth daily.    [provider]  predniSONE (DELTASONE) 10 MG tablet Take 10 mg by mouth daily as needed.  10/19/19   [provider]  triamcinolone (NASACORT) 55 MCG/ACT AERO nasal inhaler Place 1 spray into the nose daily as needed (Congestion).     [provider]  warfarin (COUMADIN) 2.5 MG tablet Take 2.5 mg by mouth daily.    [provider]  zinc gluconate 50 MG tablet Take 50 mg by mouth daily.    [provider]    Allergies    Simvastatin, Albuterol, Augmentin [amoxicillin-pot clavulanate], Ciprofloxacin, Contrast media [iodinated diagnostic agents], Flagyl [metronidazole], Latex, and Sulfa antibiotics  Review of Systems   Review of Systems  All other systems reviewed and are negative. Ten systems reviewed and are negative for acute change, except as noted in the HPI.   Physical Exam Updated Vital Signs BP 136/74   Pulse 90   Temp 99.8 F (37.7 C) (Oral)   Resp (!) 25   Ht 5\' 11"  (1.803 m)   Wt 82.6 kg   SpO2 95%   BMI 25.38 kg/m   Physical Exam Vitals and nursing note reviewed.  Constitutional:      General: He is not in acute distress.    Appearance: Normal appearance. He is  not ill-appearing, toxic-appearing or diaphoretic.  HENT:     Head: Normocephalic and atraumatic.     Right Ear: External ear normal.     Left Ear: External ear normal.     Nose: Nose normal.     Mouth/Throat:     Mouth: Mucous membranes are moist.     Pharynx: Oropharynx is clear. No oropharyngeal exudate or posterior oropharyngeal erythema.  Eyes:     Extraocular Movements: Extraocular movements intact.  Cardiovascular:     Rate and Rhythm: Normal rate. Rhythm irregular.     Pulses: Normal pulses.     Heart sounds: Normal heart sounds. No murmur heard. No friction rub. No  gallop.   Pulmonary:     Effort: Pulmonary effort is normal. No respiratory distress.     Breath sounds: No stridor. Wheezing present. No rhonchi or rales.     Comments: Expiratory wheezes noted bilaterally in the posterior lung fields. Abdominal:     General: Abdomen is flat.     Tenderness: There is no abdominal tenderness.  Musculoskeletal:        General: Normal range of motion.     Cervical back: Normal range of motion and neck supple. No tenderness.  Skin:    General: Skin is warm and dry.  Neurological:     General: No focal deficit present.     Mental Status: He is alert and oriented to person, place, and time.  Psychiatric:        Mood and Affect: Mood normal.        Behavior: Behavior normal.    ED Results / Procedures / Treatments   Labs (all labs ordered are listed, but only abnormal results are displayed) Labs Reviewed  COMPREHENSIVE METABOLIC PANEL - Abnormal; Notable for the following components:      Result Value   Potassium 2.9 (*)    Chloride 95 (*)    Glucose, Bld 104 (*)    Calcium 8.6 (*)    Albumin 3.3 (*)    All other components within normal limits  CBC WITH DIFFERENTIAL/PLATELET - Abnormal; Notable for the following components:   WBC 11.6 (*)    Neutro Abs 8.4 (*)    Monocytes Absolute 2.0 (*)    Abs Immature Granulocytes 0.16 (*)    All other components within normal  limits  URINALYSIS, ROUTINE W REFLEX MICROSCOPIC - Abnormal; Notable for the following components:   Hgb urine dipstick SMALL (*)    All other components within normal limits  RESP PANEL BY RT-PCR (FLU A&B, COVID) ARPGX2  CULTURE, BLOOD (ROUTINE X 2)  CULTURE, BLOOD (ROUTINE X 2)  LACTIC ACID, PLASMA  LACTIC ACID, PLASMA  D-DIMER, QUANTITATIVE  PROCALCITONIN  LACTATE DEHYDROGENASE  FERRITIN  TRIGLYCERIDES  FIBRINOGEN  C-REACTIVE PROTEIN  MAGNESIUM   EKG EKG Interpretation  Date/Time:  Friday October 29 2020 13:53:44 EDT Ventricular Rate:  87 PR Interval:    QRS Duration: 135 QT Interval:  387 QTC Calculation: 466 R Axis:   251 Text Interpretation: Atrial fibrillation Ventricular premature complex Nonspecific IVCD with LAD Probable lateral infarct, age indeterminate Anterior infarct, old Baseline wander in lead(s) V6 No significant change since last tracing Confirmed by Isla Pence [phone]) on 10/29/2020 2:02:03 PM   Radiology DG Chest Portable 1 View  Result Date: 10/29/2020 CLINICAL DATA:  COVID 19 positive EXAM: PORTABLE CHEST 1 VIEW COMPARISON:  11/25/2017, 08/09/2017 FINDINGS: Stable cardiomegaly. Chronically coarsened interstitial markings throughout both lungs. Unchanged irregular opacity in the left mid lung which may reflect an area of scarring. No definite superimposed airspace opacity. No large pleural fluid collection. No pneumothorax. IMPRESSION: Chronically interstitial lung changes with stable irregular opacity in the left mid lung. A superimposed infectious process would be difficult to exclude. Electronically Signed   By: Davina Poke D.O.   On: 10/29/2020 14:49    Procedures Procedures   Medications Ordered in ED Medications  dexamethasone (DECADRON) injection 10 mg (has no administration in time range)  potassium chloride (KLOR-CON) packet 60 mEq (has no administration in time range)  acetaminophen (TYLENOL) tablet 1,000 mg (1,000 mg Oral Given  10/29/20 1413)    ED Course  I have reviewed the triage  vital signs and the nursing notes.  Pertinent labs & imaging results that were available during my care of the patient were reviewed by me and considered in my medical decision making (see chart for details).    MDM Rules/Calculators/A&P                          Pt is a 74 y.o. male with a history of COPD who presents the emergency department with cough and fatigue for the past 24 hours.  He was found to be positive for COVID-19 this morning at his podiatrist.  Labs: CBC with a leukocytosis of 11.6, neutrophils of 8.4, monocytes of 2. CMP with a potassium of 2.9, chloride of 95, glucose of 104, calcium of 8.6, albumin of 3.3. UA with small hemoglobin. Respiratory panel in process.  Imaging: Chest x-ray findings as noted above.  I, Rayna Sexton, PA-C, personally reviewed and evaluated these images and lab results as part of my medical decision-making.  Patient has no home O2 requirement but does have a history of COPD.  He stopped smoking about 10 years ago.  He was ambulated in the emergency department and desaturated to about 86% on room air.  Patient placed on 2 L via nasal cannula.  We will give patient IV Decadron as well as oral potassium due to his significant hypokalemia.  We will check a magnesium in addition to his COVID-19 inflammatory markers.  Given patient's age, medical history, COVID-19 infection, feel that admission is warranted.  Will discuss with the medicine team.  Note: Portions of this report may have been transcribed using voice recognition software. Every effort was made to ensure accuracy; however, inadvertent computerized transcription errors may be present.   Final Clinical Impression(s) / ED Diagnoses Final diagnoses:  [id number]  Hypoxia    Rx / DC Orders ED Discharge Orders    None       Rayna Sexton, PA-C 10/29/20 1456    Isla Pence, MD 10/30/20 1658

## 2020-10-30 DIAGNOSIS — U071 COVID-19: Principal | ICD-10-CM

## 2020-10-30 DIAGNOSIS — J9601 Acute respiratory failure with hypoxia: Secondary | ICD-10-CM

## 2020-10-30 DIAGNOSIS — I10 Essential (primary) hypertension: Secondary | ICD-10-CM

## 2020-10-30 DIAGNOSIS — J1282 Pneumonia due to coronavirus disease 2019: Secondary | ICD-10-CM

## 2020-10-30 LAB — CBC WITH DIFFERENTIAL/PLATELET
Abs Immature Granulocytes: 0.08 10*3/uL — ABNORMAL HIGH (ref 0.00–0.07)
Basophils Absolute: 0 10*3/uL (ref 0.0–0.1)
Basophils Relative: 0 %
Eosinophils Absolute: 0 10*3/uL (ref 0.0–0.5)
Eosinophils Relative: 0 %
HCT: 43.4 % (ref 39.0–52.0)
Hemoglobin: 13.6 g/dL (ref 13.0–17.0)
Immature Granulocytes: 1 %
Lymphocytes Relative: 7 %
Lymphs Abs: 0.7 10*3/uL (ref 0.7–4.0)
MCH: 30.2 pg (ref 26.0–34.0)
MCHC: 31.3 g/dL (ref 30.0–36.0)
MCV: 96.4 fL (ref 80.0–100.0)
Monocytes Absolute: 0.7 10*3/uL (ref 0.1–1.0)
Monocytes Relative: 7 %
Neutro Abs: 8.8 10*3/uL — ABNORMAL HIGH (ref 1.7–7.7)
Neutrophils Relative %: 85 %
Platelets: 255 10*3/uL (ref 150–400)
RBC: 4.5 MIL/uL (ref 4.22–5.81)
RDW: 14.3 % (ref 11.5–15.5)
WBC: 10.3 10*3/uL (ref 4.0–10.5)
nRBC: 0 % (ref 0.0–0.2)

## 2020-10-30 LAB — COMPREHENSIVE METABOLIC PANEL
ALT: 18 U/L (ref 0–44)
AST: 17 U/L (ref 15–41)
Albumin: 3 g/dL — ABNORMAL LOW (ref 3.5–5.0)
Alkaline Phosphatase: 45 U/L (ref 38–126)
Anion gap: 8 (ref 5–15)
BUN: 17 mg/dL (ref 8–23)
CO2: 28 mmol/L (ref 22–32)
Calcium: 8.6 mg/dL — ABNORMAL LOW (ref 8.9–10.3)
Chloride: 101 mmol/L (ref 98–111)
Creatinine, Ser: 0.76 mg/dL (ref 0.61–1.24)
GFR, Estimated: >60 mL/min (ref >60)
Glucose, Bld: 131 mg/dL — ABNORMAL HIGH (ref 70–99)
Potassium: 4.2 mmol/L (ref 3.5–5.1)
Sodium: 137 mmol/L (ref 135–145)
Total Bilirubin: 0.6 mg/dL (ref 0.3–1.2)
Total Protein: 6.6 g/dL (ref 6.5–8.1)

## 2020-10-30 LAB — PROTIME-INR
INR: 2.1 — ABNORMAL HIGH (ref 0.8–1.2)
Prothrombin Time: 23.3 seconds — ABNORMAL HIGH (ref 11.4–15.2)

## 2020-10-30 LAB — D-DIMER, QUANTITATIVE: D-Dimer, Quant: <0.27 ug/mL-FEU (ref 0.00–0.50)

## 2020-10-30 LAB — C-REACTIVE PROTEIN: CRP: 9.9 mg/dL — ABNORMAL HIGH (ref <1.0)

## 2020-10-30 LAB — FERRITIN: Ferritin: 111 ng/mL (ref 24–336)

## 2020-10-30 LAB — MAGNESIUM: Magnesium: 2 mg/dL (ref 1.7–2.4)

## 2020-10-30 MED ORDER — WARFARIN SODIUM 2.5 MG PO TABS
2.5000 mg | ORAL_TABLET | Freq: Once | ORAL | Status: AC
  Administered 2020-10-30: 2.5 mg via ORAL
  Filled 2020-10-30: qty 1

## 2020-10-30 MED ORDER — ACETAMINOPHEN 500 MG PO TABS
1000.0000 mg | ORAL_TABLET | Freq: Four times a day (QID) | ORAL | Status: DC | PRN
  Administered 2020-10-30: 1000 mg via ORAL
  Filled 2020-10-30: qty 2

## 2020-10-30 MED ORDER — LOPERAMIDE HCL 2 MG PO CAPS
4.0000 mg | ORAL_CAPSULE | Freq: Once | ORAL | Status: AC
  Administered 2020-10-30: 4 mg via ORAL
  Filled 2020-10-30: qty 2

## 2020-10-30 MED ORDER — FLUTICASONE PROPIONATE 50 MCG/ACT NA SUSP: 1.0000 | Freq: Every day | NASAL | Status: DC | PRN

## 2020-10-30 MED ORDER — METHYLPREDNISOLONE SODIUM SUCC 125 MG IJ SOLR
60.0000 mg | Freq: Two times a day (BID) | INTRAMUSCULAR | Status: DC
  Administered 2020-10-30 – 2020-10-31 (×3): 60 mg via INTRAVENOUS
  Filled 2020-10-30 (×3): qty 2

## 2020-10-30 NOTE — Progress Notes (Signed)
PROGRESS NOTE  Matthew Hall [id number] DOB: January 24, 1946 DOA: 10/29/2020 PCP: [name], MD  Brief History:  75 year old male with a history of COPD, lymphocytic vasculitis, chronic atrial fibrillation, bronchiectasis, hypertension, bladder cancer, GERD, tobacco abuse in remission presenting from his podiatrist office with a positive COVID test and fevers.  The patient states that he actually had a productive cough with yellow-green sputum that began on 10/28/2020.  He states that he has chronic shortness of breath on exertion which has not changed.  He did also complain of a headache but denies any sore throat, hemoptysis, chest pain, nausea, vomiting, diarrhea, abdominal pain, hematochezia.  He went to his podiatrist for an ingrown toenail on 10/29/2020.  A COVID test was obtained in the office at that time and was positive.  In addition, the patient was noted to have a temperature of 100.8 F in the office as result, the patient was sent to the emergency department for further evaluation. In the emergency department, the patient desaturated to 86% on room air with ambulation.  He was placed on 2 L nasal cannula.  He had a low-grade temperature of 100.9 F but was hemodynamically stable.  Sodium was 136, potassium 2.9 and serum creatinine 0.99.  WBC was 11.6 with hemoglobin 13.6, platelets 255,000.  Chest x-ray showed chronic interstitial markings with stable irregularity in the left midlung.  The patient was started on IV steroids and remdesivir.  Assessment/Plan: Acute respiratory failure with hypoxia secondary to COVID-19 pneumonia -Personally reviewed chest x-ray chronic interstitial markings; scattered patchy opacities -Continue IV Solu-Medrol -Continue IV remdesivir--D#2 of 3 -Wean oxygen back to room air -CRP 4.0>>9.9 -Ferritin 88>>111 -D-dimer 0.27>>>less than <0.27  Chronic atrial fibrillation -Rate controlled -Continue warfarin -Continue metoprolol  succinate  Essential hypertension -Holding HCTZ and losartan secondary to soft blood pressure -continue metoprolol succinate  COPD/bronchiectasis -Continue Symbicort with therapeutic interchange -continue albuteral HFA  Anxiety -continue home dose alprazolam  Hypokalemia/hypomagnesemia -Replete      Status is: Inpatient  Remains inpatient appropriate because:Hemodynamically unstable and IV treatments appropriate due to intensity of illness or inability to take PO   Dispo: The patient is from: Home              Anticipated d/c is to: Home              Patient currently is not medically stable to d/c.   Difficult to place patient No        Family Communication:  No  Family at bedside  Consultants:  none  Code Status:  FULL   DVT Prophylaxis:  warfarin   Procedures: As Listed in Progress Note Above  Antibiotics: None       Subjective: Patient denies fevers, chills, headache, chest pain,nausea, vomiting, diarrhea, abdominal pain, dysuria, hematuria, hematochezia, and melena.  He has a cough, unchanged.  No hemoptysis   Objective: Vitals:   10/30/20 0633 10/30/20 0740 10/30/20 0744 10/30/20 0746  BP: 129/76 123/74    Pulse: 80 82    Resp: 18 18    Temp: 98.8 F (37.1 C) 98.2 F (36.8 C)    TempSrc:  Oral    SpO2: 99% 99% 99% 98%  Weight:      Height:        Intake/Output Summary (Last 24 hours) at 10/30/2020 0833 Last data filed at 10/30/2020 0700 Gross per 24 hour  Intake 100 ml  Output 475 ml  Net -375 ml  Weight change:  Exam:   General:  Pt is alert, follows commands appropriately, not in acute distress  HEENT: No icterus, No thrush, No neck mass, [place]/AT  Cardiovascular: RRR, S1/S2, no rubs, no gallops  Respiratory: diminished BS.  Bibasilar rales. No wheeze  Abdomen: Soft/+BS, non tender, non distended, no guarding  Extremities: No edema, No lymphangitis, No petechiae, No rashes, no synovitis   Data Reviewed: I have  personally reviewed following labs and imaging studies Basic Metabolic Panel: Recent Labs  Lab 10/29/20 1330 10/29/20 1601 10/30/20 0604  NA 136  --  137  K 2.9*  --  4.2  CL 95*  --  101  CO2 31  --  28  GLUCOSE 104*  --  131*  BUN 16  --  17  CREATININE 0.99  --  0.76  CALCIUM 8.6*  --  8.6*  MG  --  1.5* 2.0   Liver Function Tests: Recent Labs  Lab 10/29/20 1330 10/30/20 0604  AST 18 17  ALT 18 18  ALKPHOS 47 45  BILITOT 1.1 0.6  PROT 6.6 6.6  ALBUMIN 3.3* 3.0*   No results for input(s): LIPASE, AMYLASE in the last 168 hours. No results for input(s): AMMONIA in the last 168 hours. Coagulation Profile: Recent Labs  Lab 10/29/20 1601 10/30/20 0604  INR 2.2* 2.1*   CBC: Recent Labs  Lab 10/29/20 1330 10/30/20 0604  WBC 11.6* 10.3  NEUTROABS 8.4* 8.8*  HGB 13.4 13.6  HCT 43.2 43.4  MCV 97.5 96.4  PLT 260 255   Cardiac Enzymes: No results for input(s): CKTOTAL, CKMB, CKMBINDEX, TROPONINI in the last 168 hours. BNP: Invalid input(s): POCBNP CBG: No results for input(s): GLUCAP in the last 168 hours. HbA1C: No results for input(s): HGBA1C in the last 72 hours. Urine analysis:    Component Value Date/Time   COLORURINE YELLOW 10/29/2020 1410   APPEARANCEUR CLEAR 10/29/2020 1410   LABSPEC 1.020 10/29/2020 1410   PHURINE 5.0 10/29/2020 1410   GLUCOSEU NEGATIVE 10/29/2020 1410   HGBUR SMALL (A) 10/29/2020 1410   BILIRUBINUR NEGATIVE 10/29/2020 1410   KETONESUR NEGATIVE 10/29/2020 1410   PROTEINUR NEGATIVE 10/29/2020 1410   NITRITE NEGATIVE 10/29/2020 1410   LEUKOCYTESUR NEGATIVE 10/29/2020 1410   Sepsis Labs: @LABRCNTIP (procalcitonin:4,lacticidven:4) ) Recent Results (from the past 240 hour(s))  Resp Panel by RT-PCR (Flu A&B, Covid)     Status: Abnormal   Collection Time: 10/29/20  2:10 PM   Specimen: Nasopharyngeal(NP) swabs in vial transport medium  Result Value Ref Range Status   SARS Coronavirus 2 by RT PCR POSITIVE (A) NEGATIVE Final     Comment: CRITICAL RESULT CALLED TO, READ BACK BY AND VERIFIED WITH: CRABTREE,B 1538 10/29/2020 COLEMAN,R (NOTE) SARS-CoV-2 target nucleic acids are DETECTED.  The SARS-CoV-2 RNA is generally detectable in upper respiratory specimens during the acute phase of infection. Positive results are indicative of the presence of the identified virus, but do not rule out bacterial infection or co-infection with other pathogens not detected by the test. Clinical correlation with patient history and other diagnostic information is necessary to determine patient infection status. The expected result is Negative.  Fact Sheet for Patients: EntrepreneurPulse.com.au  Fact Sheet for Healthcare Providers: IncredibleEmployment.be  This test is not yet approved or cleared by the [place] FDA and  has been authorized for detection and/or diagnosis of SARS-CoV-2 by FDA under an Emergency Use Authorization (EUA).  This EUA will remain in effect (meaning this  test can be used) for the duration  of  the COVID-19 declaration under Section 564(b)(1) of the Act, 21 U.S.C. section 360bbb-3(b)(1), unless the authorization is terminated or revoked sooner.     Influenza A by PCR NEGATIVE NEGATIVE Final   Influenza B by PCR NEGATIVE NEGATIVE Final    Comment: (NOTE) The Xpert Xpress SARS-CoV-2/FLU/RSV plus assay is intended as an aid in the diagnosis of influenza from Nasopharyngeal swab specimens and should not be used as a sole basis for treatment. Nasal washings and aspirates are unacceptable for Xpert Xpress SARS-CoV-2/FLU/RSV testing.  Fact Sheet for Patients: EntrepreneurPulse.com.au  Fact Sheet for Healthcare Providers: IncredibleEmployment.be  This test is not yet approved or cleared by the [place] FDA and has been authorized for detection and/or diagnosis of SARS-CoV-2 by FDA under an Emergency Use Authorization  (EUA). This EUA will remain in effect (meaning this test can be used) for the duration of the COVID-19 declaration under Section 564(b)(1) of the Act, 21 U.S.C. section 360bbb-3(b)(1), unless the authorization is terminated or revoked.  Performed at [facility], [address], [place], [place] [address]   Blood Culture (routine x 2)     Status: None (Preliminary result)   Collection Time: 10/29/20  3:52 PM   Specimen: BLOOD LEFT HAND  Result Value Ref Range Status   Specimen Description BLOOD LEFT HAND  Final   Special Requests   Final    Blood Culture results may not be optimal due to an inadequate volume of blood received in culture bottles BOTTLES DRAWN AEROBIC AND ANAEROBIC   Culture   Final    NO GROWTH < 24 HOURS Performed at [facility], [address] [place], [place] [address]    Report Status PENDING  Incomplete  Blood Culture (routine x 2)     Status: None (Preliminary result)   Collection Time: 10/29/20  4:01 PM   Specimen: Left Antecubital; Blood  Result Value Ref Range Status   Specimen Description LEFT ANTECUBITAL  Final   Special Requests   Final    Blood Culture results may not be optimal due to an inadequate volume of blood received in culture bottles BOTTLES DRAWN AEROBIC AND ANAEROBIC   Culture   Final    NO GROWTH < 24 HOURS Performed at [facility], [address], [place], [place] [address]    Report Status PENDING  Incomplete     Scheduled Meds: . vitamin C  500 mg Oral Daily  . fluticasone furoate-vilanterol  1 puff Inhalation Daily  . loratadine  10 mg Oral Daily  . methylPREDNISolone (SOLU-MEDROL) injection  60 mg Intravenous Q12H  . metoprolol succinate  50 mg Oral BID  . neomycin-polymyxin b-dexamethasone  1 application Both Eyes QHS  . pantoprazole  40 mg Oral Daily  . potassium chloride  20 mEq Oral BID  . Warfarin - Pharmacist Dosing Inpatient   Does not apply q1600  . zinc sulfate  220 mg Oral Daily   Continuous Infusions: . remdesivir  100 mg in NS 100 mL      Procedures/Studies: DG Chest Portable 1 View  Result Date: 10/29/2020 CLINICAL DATA:  COVID 19 positive EXAM: PORTABLE CHEST 1 VIEW COMPARISON:  11/25/2017, 08/09/2017 FINDINGS: Stable cardiomegaly. Chronically coarsened interstitial markings throughout both lungs. Unchanged irregular opacity in the left mid lung which may reflect an area of scarring. No definite superimposed airspace opacity. No large pleural fluid collection. No pneumothorax. IMPRESSION: Chronically interstitial lung changes with stable irregular opacity in the left mid lung. A superimposed infectious process would  be difficult to exclude. Electronically Signed   By: Davina Poke D.O.   On: 10/29/2020 14:49    Orson Eva, DO  [facility]  If 7PM-7AM, please contact night-coverage [email or web address] Password [facility] 10/30/2020, 8:33 AM   LOS: 1 day

## 2020-10-30 NOTE — Progress Notes (Signed)
[place] for warfarin Indication: atrial fibrillation  Allergies  Allergen Reactions  . Simvastatin Rash  . Albuterol Other (See Comments)    Increases heartrate!!!   . Augmentin [Amoxicillin-Pot Clavulanate] Rash  . Ciprofloxacin Rash  . Contrast Media [Iodinated Diagnostic Agents] Rash  . Flagyl [Metronidazole] Rash  . Latex Hives and Rash  . Sulfa Antibiotics Rash    Patient Measurements: Height: 5\' 11"  (180.3 cm) Weight: 80.8 kg (178 lb 2.1 oz) IBW/kg (Calculated) : 75.3 Heparin Dosing Weight:   Vital Signs: Temp: 98.2 F (36.8 C) (04/23 0740) Temp Source: Oral (04/23 0740) BP: 123/74 (04/23 0740) Pulse Rate: 82 (04/23 0740)  Labs: Recent Labs    10/29/20 1330 10/29/20 1601 10/30/20 0604  HGB 13.4  --  13.6  HCT 43.2  --  43.4  PLT 260  --  255  LABPROT  --  24.3* 23.3*  INR  --  2.2* 2.1*  CREATININE 0.99  --  0.76    Estimated Creatinine Clearance: 83.7 mL/min (by C-G formula based on SCr of 0.76 mg/dL).   Medical History: Past Medical History:  Diagnosis Date  . Anxiety   . Aortic atherosclerosis ([place])   . Atrial fibrillation ([place])   . Basal cell carcinoma   . Bladder cancer ([place])   . Bladder tumor   . Colon polyps   . COPD (chronic obstructive pulmonary disease) ([place])   . Cutaneous vasculitis   . Diverticulosis   . Essential hypertension   . GERD (gastroesophageal reflux disease)   . History of pneumonia   . Nephrolithiasis   . Osteoarthritis     Medications:  Medications Prior to Admission  Medication Sig Dispense Refill Last Dose  . ALPRAZolam (XANAX) 0.25 MG tablet Take 0.25 mg by mouth 2 (two) times daily as needed for anxiety or sleep.   3 10/28/2020 at Unknown time  . budesonide-formoterol (SYMBICORT) 160-4.5 MCG/ACT inhaler Inhale 2 puffs into the lungs 2 (two) times daily. 1 Inhaler 6 Past Month at Unknown time  . cetirizine (ZYRTEC) 5 MG tablet Take 5 mg by mouth daily.   10/28/2020 at  Unknown time  . Cyanocobalamin (VITAMIN B12 PO) Take 1 tablet by mouth daily.   10/28/2020 at Unknown time  . D3-50 50000 units capsule Take 50,000 Units by mouth once a week. Usually Tuesday or Wednesday.  0 10/27/2020  . levalbuterol (XOPENEX) 0.63 MG/3ML nebulizer solution Take 3 mLs (0.63 mg total) by nebulization every 6 (six) hours as needed for wheezing or shortness of breath. 75 mL 6 10/28/2020 at Unknown time  . losartan-hydrochlorothiazide (HYZAAR) 50-12.5 MG tablet Take 1 tablet by mouth daily.    10/28/2020 at Unknown time  . metoprolol succinate (TOPROL-XL) 50 MG 24 hr tablet Take 1 tablet (50 mg total) by mouth 2 (two) times daily. 60 tablet 0 10/28/2020 at 2200  . neomycin-polymyxin b-dexamethasone (MAXITROL) 4.0-08676-1.9 OINT 1 application at bedtime.   Past Month at Unknown time  . omeprazole (PRILOSEC) 40 MG capsule Take 40 mg by mouth daily.   10/28/2020 at Unknown time  . predniSONE (DELTASONE) 10 MG tablet Take 10 mg by mouth daily as needed.    10/28/2020 at Unknown time  . triamcinolone (NASACORT) 55 MCG/ACT AERO nasal inhaler Place 1 spray into the nose daily as needed (Congestion).    10/28/2020 at Unknown time  . warfarin (COUMADIN) 2.5 MG tablet Take 2.5 mg by mouth daily.   10/28/2020 at 2200  . zinc gluconate 50  MG tablet Take 50 mg by mouth daily.   10/28/2020 at Unknown time  . Albuterol Sulfate 108 (90 Base) MCG/ACT AEPB Inhale 2 puffs into the lungs every 6 (six) hours as needed. (Patient not taking: No sig reported) 1 each 3 Not Taking at Unknown time  . erythromycin ophthalmic ointment Place 1 application into both eyes daily as needed (Infection). (Patient not taking: No sig reported)   Not Taking at Unknown time  . hydroxypropyl methylcellulose / hypromellose (ISOPTO TEARS / GONIOVISC) 2.5 % ophthalmic solution Place 1 drop into both eyes as needed for dry eyes. (Patient not taking: Reported on 10/29/2020)   Not Taking at Unknown time  . Multiple Vitamin (MULTIVITAMIN WITH  MINERALS) TABS tablet Take 1 tablet by mouth daily. (Patient not taking: Reported on 10/29/2020)   Not Taking at Unknown time    Assessment: INR therapeutic 4/23 = 2.1 Pharmacy consulted to dose warfarin in patient with atrial fibrillation.  Patient's INR on admission 2.2.   Home dose listed at 2.5 mg daily with last dose given 4/21 @ 2200.  Goal of Therapy:  INR 2-3 Monitor platelets by anticoagulation protocol: Yes   Plan:  Warfarin 2.5 mg x 1 dose. Monitor daily INR and s/s of bleeding.  Hart Robinsons A 10/30/2020,9:12 AM

## 2020-10-31 DIAGNOSIS — I482 Chronic atrial fibrillation, unspecified: Secondary | ICD-10-CM

## 2020-10-31 DIAGNOSIS — E876 Hypokalemia: Secondary | ICD-10-CM

## 2020-10-31 LAB — BLOOD CULTURE ID PANEL (REFLEXED) - BCID2

## 2020-10-31 LAB — CBC WITH DIFFERENTIAL/PLATELET
Abs Immature Granulocytes: 0.14 10*3/uL — ABNORMAL HIGH (ref 0.00–0.07)
Basophils Absolute: 0 10*3/uL (ref 0.0–0.1)
Basophils Relative: 0 %
Eosinophils Absolute: 0 10*3/uL (ref 0.0–0.5)
Eosinophils Relative: 0 %
HCT: 46.7 % (ref 39.0–52.0)
Hemoglobin: 14.9 g/dL (ref 13.0–17.0)
Immature Granulocytes: 1 %
Lymphocytes Relative: 5 %
Lymphs Abs: 0.9 10*3/uL (ref 0.7–4.0)
MCH: 30.8 pg (ref 26.0–34.0)
MCHC: 31.9 g/dL (ref 30.0–36.0)
MCV: 96.5 fL (ref 80.0–100.0)
Monocytes Absolute: 1 10*3/uL (ref 0.1–1.0)
Monocytes Relative: 5 %
Neutro Abs: 16.4 10*3/uL — ABNORMAL HIGH (ref 1.7–7.7)
Neutrophils Relative %: 89 %
Platelets: 274 10*3/uL (ref 150–400)
RBC: 4.84 MIL/uL (ref 4.22–5.81)
RDW: 14.6 % (ref 11.5–15.5)
WBC: 18.5 10*3/uL — ABNORMAL HIGH (ref 4.0–10.5)
nRBC: 0 % (ref 0.0–0.2)

## 2020-10-31 LAB — COMPREHENSIVE METABOLIC PANEL
ALT: 18 U/L (ref 0–44)
AST: 18 U/L (ref 15–41)
Albumin: 3.3 g/dL — ABNORMAL LOW (ref 3.5–5.0)
Alkaline Phosphatase: 45 U/L (ref 38–126)
Anion gap: 9 (ref 5–15)
BUN: 20 mg/dL (ref 8–23)
CO2: 29 mmol/L (ref 22–32)
Calcium: 9 mg/dL (ref 8.9–10.3)
Chloride: 102 mmol/L (ref 98–111)
Creatinine, Ser: 0.77 mg/dL (ref 0.61–1.24)
GFR, Estimated: >60 mL/min (ref >60)
Glucose, Bld: 137 mg/dL — ABNORMAL HIGH (ref 70–99)
Potassium: 4.4 mmol/L (ref 3.5–5.1)
Sodium: 140 mmol/L (ref 135–145)
Total Bilirubin: 0.7 mg/dL (ref 0.3–1.2)
Total Protein: 6.8 g/dL (ref 6.5–8.1)

## 2020-10-31 LAB — MAGNESIUM: Magnesium: 2.2 mg/dL (ref 1.7–2.4)

## 2020-10-31 LAB — FERRITIN: Ferritin: 190 ng/mL (ref 24–336)

## 2020-10-31 LAB — PROTIME-INR
INR: 1.9 — ABNORMAL HIGH (ref 0.8–1.2)
Prothrombin Time: 22.1 seconds — ABNORMAL HIGH (ref 11.4–15.2)

## 2020-10-31 LAB — C-REACTIVE PROTEIN: CRP: 6.1 mg/dL — ABNORMAL HIGH (ref <1.0)

## 2020-10-31 LAB — D-DIMER, QUANTITATIVE: D-Dimer, Quant: <0.27 ug/mL-FEU (ref 0.00–0.50)

## 2020-10-31 MED ORDER — PREDNISONE 20 MG PO TABS: 50.0000 mg | ORAL_TABLET | Freq: Every day | ORAL | Status: DC

## 2020-10-31 MED ORDER — WARFARIN SODIUM 5 MG PO TABS: 5.0000 mg | ORAL_TABLET | Freq: Once | ORAL | Status: DC

## 2020-10-31 MED ORDER — PREDNISONE 50 MG PO TABS
50.0000 mg | ORAL_TABLET | Freq: Every day | ORAL | 0 refills | Status: DC
Start: 2020-11-01 — End: 2020-12-01

## 2020-10-31 NOTE — Progress Notes (Signed)
[place] for warfarin Indication: atrial fibrillation  Allergies  Allergen Reactions  . Simvastatin Rash  . Albuterol Other (See Comments)    Increases heartrate!!!   . Augmentin [Amoxicillin-Pot Clavulanate] Rash  . Ciprofloxacin Rash  . Contrast Media [Iodinated Diagnostic Agents] Rash  . Flagyl [Metronidazole] Rash  . Latex Hives and Rash  . Sulfa Antibiotics Rash    Patient Measurements: Height: 5\' 11"  (180.3 cm) Weight: 80.8 kg (178 lb 2.1 oz) IBW/kg (Calculated) : 75.3  Vital Signs: Temp: 97.8 F (36.6 C) (04/24 0540) Temp Source: Oral (04/24 0540) BP: 133/87 (04/24 0846) Pulse Rate: 88 (04/24 0846)  Labs: Recent Labs    10/29/20 1330 10/29/20 1601 10/30/20 0604 10/31/20 0722  HGB 13.4  --  13.6 14.9  HCT 43.2  --  43.4 46.7  PLT 260  --  255 274  LABPROT  --  24.3* 23.3* 22.1*  INR  --  2.2* 2.1* 1.9*  CREATININE 0.99  --  0.76 0.77    Estimated Creatinine Clearance: 83.7 mL/min (by C-G formula based on SCr of 0.77 mg/dL).   Medical History: Past Medical History:  Diagnosis Date  . Anxiety   . Aortic atherosclerosis ([place])   . Atrial fibrillation ([place])   . Basal cell carcinoma   . Bladder cancer ([place])   . Bladder tumor   . Colon polyps   . COPD (chronic obstructive pulmonary disease) ([place])   . Cutaneous vasculitis   . Diverticulosis   . Essential hypertension   . GERD (gastroesophageal reflux disease)   . History of pneumonia   . Nephrolithiasis   . Osteoarthritis     Medications:  Medications Prior to Admission  Medication Sig Dispense Refill Last Dose  . ALPRAZolam (XANAX) 0.25 MG tablet Take 0.25 mg by mouth 2 (two) times daily as needed for anxiety or sleep.   3 10/28/2020 at Unknown time  . budesonide-formoterol (SYMBICORT) 160-4.5 MCG/ACT inhaler Inhale 2 puffs into the lungs 2 (two) times daily. 1 Inhaler 6 Past Month at Unknown time  . cetirizine (ZYRTEC) 5 MG tablet Take 5 mg by mouth daily.    10/28/2020 at Unknown time  . Cyanocobalamin (VITAMIN B12 PO) Take 1 tablet by mouth daily.   10/28/2020 at Unknown time  . D3-50 50000 units capsule Take 50,000 Units by mouth once a week. Usually Tuesday or Wednesday.  0 10/27/2020  . levalbuterol (XOPENEX) 0.63 MG/3ML nebulizer solution Take 3 mLs (0.63 mg total) by nebulization every 6 (six) hours as needed for wheezing or shortness of breath. 75 mL 6 10/28/2020 at Unknown time  . losartan-hydrochlorothiazide (HYZAAR) 50-12.5 MG tablet Take 1 tablet by mouth daily.    10/28/2020 at Unknown time  . metoprolol succinate (TOPROL-XL) 50 MG 24 hr tablet Take 1 tablet (50 mg total) by mouth 2 (two) times daily. 60 tablet 0 10/28/2020 at 2200  . neomycin-polymyxin b-dexamethasone (MAXITROL) 4.3-32951-8.8 OINT 1 application at bedtime.   Past Month at Unknown time  . omeprazole (PRILOSEC) 40 MG capsule Take 40 mg by mouth daily.   10/28/2020 at Unknown time  . predniSONE (DELTASONE) 10 MG tablet Take 10 mg by mouth daily as needed.    10/28/2020 at Unknown time  . triamcinolone (NASACORT) 55 MCG/ACT AERO nasal inhaler Place 1 spray into the nose daily as needed (Congestion).    10/28/2020 at Unknown time  . warfarin (COUMADIN) 2.5 MG tablet Take 2.5 mg by mouth daily.   10/28/2020 at 2200  .  zinc gluconate 50 MG tablet Take 50 mg by mouth daily.   10/28/2020 at Unknown time  . Albuterol Sulfate 108 (90 Base) MCG/ACT AEPB Inhale 2 puffs into the lungs every 6 (six) hours as needed. (Patient not taking: No sig reported) 1 each 3 Not Taking at Unknown time  . erythromycin ophthalmic ointment Place 1 application into both eyes daily as needed (Infection). (Patient not taking: No sig reported)   Not Taking at Unknown time  . hydroxypropyl methylcellulose / hypromellose (ISOPTO TEARS / GONIOVISC) 2.5 % ophthalmic solution Place 1 drop into both eyes as needed for dry eyes. (Patient not taking: Reported on 10/29/2020)   Not Taking at Unknown time  . Multiple Vitamin  (MULTIVITAMIN WITH MINERALS) TABS tablet Take 1 tablet by mouth daily. (Patient not taking: Reported on 10/29/2020)   Not Taking at Unknown time    Assessment: INR therapeutic 4/23 = 2.1 Pharmacy consulted to dose warfarin in patient with atrial fibrillation.  Patient's INR on admission 2.2.   INR trending down, 1.9 today, subtherapeutic  Goal of Therapy:  INR 2-3 Monitor platelets by anticoagulation protocol: Yes   Plan:  Boost INR with Warfarin 5 mg x 1 dose today Monitor daily INR and s/s of bleeding.  Hart Robinsons A 10/31/2020,9:22 AM

## 2020-10-31 NOTE — Progress Notes (Signed)
[facility] Discharge Note  Admit Date:  10/29/2020 Discharge date: 10/31/2020   Matthew Hall to be D/C'd Home per MD order.  AVS completed.  Copy for chart, and copy for patient signed, and dated. Patient/caregiver able to verbalize understanding. Patients IV removed. Discharge paper work given and reviewed. Patient stable. Patient waiting for ride   Discharge Medication: Allergies as of 10/31/2020      Reactions   Simvastatin Rash   Albuterol Other (See Comments)   Increases heartrate!!!    Augmentin [amoxicillin-pot Clavulanate] Rash   Ciprofloxacin Rash   Contrast Media [iodinated Diagnostic Agents] Rash   Flagyl [metronidazole] Rash   Latex Hives, Rash   Sulfa Antibiotics Rash      Medication List    STOP taking these medications   Albuterol Sulfate 108 (90 Base) MCG/ACT Aepb Commonly known as: PROAIR RESPICLICK   erythromycin ophthalmic ointment   hydroxypropyl methylcellulose / hypromellose 2.5 % ophthalmic solution Commonly known as: ISOPTO TEARS / GONIOVISC   losartan-hydrochlorothiazide 50-12.5 MG tablet Commonly known as: HYZAAR   multivitamin with minerals Tabs tablet     TAKE these medications   ALPRAZolam 0.25 MG tablet Commonly known as: XANAX Take 0.25 mg by mouth 2 (two) times daily as needed for anxiety or sleep.   budesonide-formoterol 160-4.5 MCG/ACT inhaler Commonly known as: SYMBICORT Inhale 2 puffs into the lungs 2 (two) times daily.   cetirizine 5 MG tablet Commonly known as: ZYRTEC Take 5 mg by mouth daily.   D3-50 1.25 MG (50000 UT) capsule Generic drug: Cholecalciferol Take 50,000 Units by mouth once a week. Usually Tuesday or Wednesday.   levalbuterol 0.63 MG/3ML nebulizer solution Commonly known as: Xopenex Take 3 mLs (0.63 mg total) by nebulization every 6 (six) hours as needed for wheezing or shortness of breath.   metoprolol succinate 50 MG 24 hr tablet Commonly known as: TOPROL-XL Take 1 tablet (50 mg total) by mouth 2 (two)  times daily.   neomycin-polymyxin b-dexamethasone 3.5-10000-0.1 Oint Commonly known as: MAXITROL 1 application at bedtime.   omeprazole 40 MG capsule Commonly known as: PRILOSEC Take 40 mg by mouth daily.   predniSONE 50 MG tablet Commonly known as: DELTASONE Take 1 tablet (50 mg total) by mouth daily with breakfast. Start taking on: November 01, 2020 What changed:   medication strength  how much to take  when to take this  reasons to take this   triamcinolone 55 MCG/ACT Aero nasal inhaler Commonly known as: NASACORT Place 1 spray into the nose daily as needed (Congestion).   VITAMIN B12 PO Take 1 tablet by mouth daily.   warfarin 2.5 MG tablet Commonly known as: COUMADIN Take 2.5 mg by mouth daily.   zinc gluconate 50 MG tablet Take 50 mg by mouth daily.       Discharge Assessment: Vitals:   10/31/20 0728 10/31/20 0846  BP:  133/87  Pulse:  88  Resp:    Temp:    SpO2: 97% 97%   Skin clean, dry and intact without evidence of skin break down, no evidence of skin tears noted. IV catheter discontinued intact. Site without signs and symptoms of complications - no redness or edema noted at insertion site, patient denies c/o pain - only slight tenderness at site.  Dressing with slight pressure applied.  D/c Instructions-Education: Discharge instructions given to patient/family with verbalized understanding. D/c education completed with patient/family including follow up instructions, medication list, d/c activities limitations if indicated, with other d/c instructions as indicated by MD -  patient able to verbalize understanding, all questions fully answered. Patient instructed to return to ED, call 911, or call MD for any changes in condition.  Patient escorted via [place], and D/C home via private auto.  Zenaida Deed, RN 10/31/2020 11:10 AM

## 2020-10-31 NOTE — Discharge Summary (Addendum)
Physician Discharge Summary  Matthew Hall [id number] DOB: [birth year] DOA: 10/29/2020  PCP: [name], MD  Admit date: 10/29/2020 Discharge date: 10/31/2020  Admitted From: Home Disposition:  Home   Recommendations for Outpatient Follow-up:  1. Follow up with PCP in 1-2 weeks 2. Please obtain BMP/CBC in one week    Discharge Condition: Stable CODE STATUS: FULL Diet recommendation: Heart Healthy   Brief/Interim Summary: 75 year old male with a history of COPD, lymphocytic vasculitis, chronic atrial fibrillation, bronchiectasis, hypertension, bladder cancer, GERD, tobacco abuse in remission presenting from his podiatrist office with a positive COVID test and fevers.  The patient states that he actually had a productive cough with yellow-green sputum that began on 10/28/2020.  He states that he has chronic shortness of breath on exertion which has not changed.  He did also complain of a headache but denies any sore throat, hemoptysis, chest pain, nausea, vomiting, diarrhea, abdominal pain, hematochezia.  He went to his podiatrist for an ingrown toenail on 10/29/2020.  A COVID test was obtained in the office at that time and was positive.  In addition, the patient was noted to have a temperature of 100.8 F in the office as result, the patient was sent to the emergency department for further evaluation. In the emergency department, the patient desaturated to 86% on room air with ambulation.  He was placed on 2 L nasal cannula.  He had a low-grade temperature of 100.9 F but was hemodynamically stable.  Sodium was 136, potassium 2.9 and serum creatinine 0.99.  WBC was 11.6 with hemoglobin 13.6, platelets 255,000.  Chest x-ray showed chronic interstitial markings with stable irregularity in the left midlung.  The patient was started on IV steroids and remdesivir.  Discharge Diagnoses:  Acute respiratory failure with hypoxia secondary to COVID-19 pneumonia -Personally reviewed chest  x-ray chronic interstitial markings; scattered patchy opacities -Continue IV Solu-Medrol>>d/c home with prednisone x 7 more days -Continue IV remdesivir--D#3 of 3 -Weaned oxygen back to room air -CRP 4.0>>9.9>>6.1 -Ferritin 88>>111>>190 -D-dimer 0.27>>>less than <0.27 x 2 -ambulatory pulseox on RA did not show any desaturation on day of d/c  Chronic atrial fibrillation -Rate controlled -Continue warfarin -Continue metoprolol succinate  Essential hypertension -Holding HCTZ and losartan secondary to soft blood pressure -continue metoprolol succinate -BP remained well controlled off of losartan/HCTZ--will not restart--follow up with PCP to monitor BP  COPD/bronchiectasis -Continue Symbicort with therapeutic interchange -continue albuteral HFA  Anxiety -continue home dose alprazolam  Hypokalemia/hypomagnesemia -Repleted  Bacteremia -GPC one of 2 sets -represents contaminant   Discharge Instructions   Allergies as of 10/31/2020      Reactions   Simvastatin Rash   Albuterol Other (See Comments)   Increases heartrate!!!    Augmentin [amoxicillin-pot Clavulanate] Rash   Ciprofloxacin Rash   Contrast Media [iodinated Diagnostic Agents] Rash   Flagyl [metronidazole] Rash   Latex Hives, Rash   Sulfa Antibiotics Rash      Medication List    STOP taking these medications   Albuterol Sulfate 108 (90 Base) MCG/ACT Aepb Commonly known as: PROAIR RESPICLICK   erythromycin ophthalmic ointment   hydroxypropyl methylcellulose / hypromellose 2.5 % ophthalmic solution Commonly known as: ISOPTO TEARS / GONIOVISC   losartan-hydrochlorothiazide 50-12.5 MG tablet Commonly known as: HYZAAR   multivitamin with minerals Tabs tablet     TAKE these medications   ALPRAZolam 0.25 MG tablet Commonly known as: XANAX Take 0.25 mg by mouth 2 (two) times daily as needed for anxiety or sleep.   budesonide-formoterol 160-4.5 MCG/ACT  inhaler Commonly known as: SYMBICORT Inhale 2  puffs into the lungs 2 (two) times daily.   cetirizine 5 MG tablet Commonly known as: ZYRTEC Take 5 mg by mouth daily.   D3-50 1.25 MG (50000 UT) capsule Generic drug: Cholecalciferol Take 50,000 Units by mouth once a week. Usually Tuesday or Wednesday.   levalbuterol 0.63 MG/3ML nebulizer solution Commonly known as: Xopenex Take 3 mLs (0.63 mg total) by nebulization every 6 (six) hours as needed for wheezing or shortness of breath.   metoprolol succinate 50 MG 24 hr tablet Commonly known as: TOPROL-XL Take 1 tablet (50 mg total) by mouth 2 (two) times daily.   neomycin-polymyxin b-dexamethasone 3.5-10000-0.1 Oint Commonly known as: MAXITROL 1 application at bedtime.   omeprazole 40 MG capsule Commonly known as: PRILOSEC Take 40 mg by mouth daily.   predniSONE 50 MG tablet Commonly known as: DELTASONE Take 1 tablet (50 mg total) by mouth daily with breakfast. Start taking on: November 01, 2020 What changed:   medication strength  how much to take  when to take this  reasons to take this   triamcinolone 55 MCG/ACT Aero nasal inhaler Commonly known as: NASACORT Place 1 spray into the nose daily as needed (Congestion).   VITAMIN B12 PO Take 1 tablet by mouth daily.   warfarin 2.5 MG tablet Commonly known as: COUMADIN Take 2.5 mg by mouth daily.   zinc gluconate 50 MG tablet Take 50 mg by mouth daily.       Allergies  Allergen Reactions  . Simvastatin Rash  . Albuterol Other (See Comments)    Increases heartrate!!!   . Augmentin [Amoxicillin-Pot Clavulanate] Rash  . Ciprofloxacin Rash  . Contrast Media [Iodinated Diagnostic Agents] Rash  . Flagyl [Metronidazole] Rash  . Latex Hives and Rash  . Sulfa Antibiotics Rash    Consultations:  none   Procedures/Studies: DG Chest Portable 1 View  Result Date: 10/29/2020 CLINICAL DATA:  COVID 19 positive EXAM: PORTABLE CHEST 1 VIEW COMPARISON:  11/25/2017, 08/09/2017 FINDINGS: Stable cardiomegaly.  Chronically coarsened interstitial markings throughout both lungs. Unchanged irregular opacity in the left mid lung which may reflect an area of scarring. No definite superimposed airspace opacity. No large pleural fluid collection. No pneumothorax. IMPRESSION: Chronically interstitial lung changes with stable irregular opacity in the left mid lung. A superimposed infectious process would be difficult to exclude. Electronically Signed   By: Davina Poke D.O.   On: 10/29/2020 14:49         Discharge Exam: Vitals:   10/31/20 0728 10/31/20 0846  BP:  133/87  Pulse:  88  Resp:    Temp:    SpO2: 97% 97%   Vitals:   10/30/20 2100 10/31/20 0540 10/31/20 0728 10/31/20 0846  BP: 118/82 133/85  133/87  Pulse: 79 73  88  Resp: 18 18    Temp: 97.8 F (36.6 C) 97.8 F (36.6 C)    TempSrc: Oral Oral    SpO2: 99% 97% 97% 97%  Weight:      Height:        General: Pt is alert, awake, not in acute distress Cardiovascular: RRR, S1/S2 +, no rubs, no gallops Respiratory: bibasilar crackles.  No wheeze Abdominal: Soft, NT, ND, bowel sounds + Extremities: nonpitting edema, no cyanosis   The results of significant diagnostics from this hospitalization (including imaging, microbiology, ancillary and laboratory) are listed below for reference.    Significant Diagnostic Studies: DG Chest Portable 1 View  Result Date: 10/29/2020 CLINICAL DATA:  COVID 19 positive EXAM: PORTABLE CHEST 1 VIEW COMPARISON:  11/25/2017, 08/09/2017 FINDINGS: Stable cardiomegaly. Chronically coarsened interstitial markings throughout both lungs. Unchanged irregular opacity in the left mid lung which may reflect an area of scarring. No definite superimposed airspace opacity. No large pleural fluid collection. No pneumothorax. IMPRESSION: Chronically interstitial lung changes with stable irregular opacity in the left mid lung. A superimposed infectious process would be difficult to exclude. Electronically Signed   By:  Davina Poke D.O.   On: 10/29/2020 14:49     Microbiology: Recent Results (from the past 240 hour(s))  Resp Panel by RT-PCR (Flu A&B, Covid)     Status: Abnormal   Collection Time: 10/29/20  2:10 PM   Specimen: Nasopharyngeal(NP) swabs in vial transport medium  Result Value Ref Range Status   SARS Coronavirus 2 by RT PCR POSITIVE (A) NEGATIVE Final    Comment: CRITICAL RESULT CALLED TO, READ BACK BY AND VERIFIED WITH: CRABTREE,B 1538 10/29/2020 COLEMAN,R (NOTE) SARS-CoV-2 target nucleic acids are DETECTED.  The SARS-CoV-2 RNA is generally detectable in upper respiratory specimens during the acute phase of infection. Positive results are indicative of the presence of the identified virus, but do not rule out bacterial infection or co-infection with other pathogens not detected by the test. Clinical correlation with patient history and other diagnostic information is necessary to determine patient infection status. The expected result is Negative.  Fact Sheet for Patients: EntrepreneurPulse.com.au  Fact Sheet for Healthcare Providers: IncredibleEmployment.be  This test is not yet approved or cleared by the [place] FDA and  has been authorized for detection and/or diagnosis of SARS-CoV-2 by FDA under an Emergency Use Authorization (EUA).  This EUA will remain in effect (meaning this  test can be used) for the duration of  the COVID-19 declaration under Section 564(b)(1) of the Act, 21 U.S.C. section 360bbb-3(b)(1), unless the authorization is terminated or revoked sooner.     Influenza A by PCR NEGATIVE NEGATIVE Final   Influenza B by PCR NEGATIVE NEGATIVE Final    Comment: (NOTE) The Xpert Xpress SARS-CoV-2/FLU/RSV plus assay is intended as an aid in the diagnosis of influenza from Nasopharyngeal swab specimens and should not be used as a sole basis for treatment. Nasal washings and aspirates are unacceptable for Xpert Xpress  SARS-CoV-2/FLU/RSV testing.  Fact Sheet for Patients: EntrepreneurPulse.com.au  Fact Sheet for Healthcare Providers: IncredibleEmployment.be  This test is not yet approved or cleared by the [place] FDA and has been authorized for detection and/or diagnosis of SARS-CoV-2 by FDA under an Emergency Use Authorization (EUA). This EUA will remain in effect (meaning this test can be used) for the duration of the COVID-19 declaration under Section 564(b)(1) of the Act, 21 U.S.C. section 360bbb-3(b)(1), unless the authorization is terminated or revoked.  Performed at [facility], [place], [address] [place], [place] [address]   Blood Culture (routine x 2)     Status: None (Preliminary result)   Collection Time: 10/29/20  3:52 PM   Specimen: BLOOD LEFT HAND  Result Value Ref Range Status   Specimen Description BLOOD LEFT HAND  Final   Special Requests   Final    Blood Culture results may not be optimal due to an inadequate volume of blood received in culture bottles BOTTLES DRAWN AEROBIC AND ANAEROBIC   Culture   Final    NO GROWTH 2 DAYS Performed at [facility], [address] [place], [place] [address]    Report Status PENDING  Incomplete  Blood Culture (routine x  2)     Status: None (Preliminary result)   Collection Time: 10/29/20  4:01 PM   Specimen: Left Antecubital; Blood  Result Value Ref Range Status   Specimen Description   Final    LEFT ANTECUBITAL Performed at [facility], [address], [place], [place] [address]    Special Requests   Final    Blood Culture results may not be optimal due to an inadequate volume of blood received in culture bottles BOTTLES DRAWN AEROBIC AND ANAEROBIC Performed at [facility], [address], [place], [place] [address]    Culture  Setup Time   Final    ANAEROBIC BOTTLE GRAM POSITIVE COCCI CRITICAL RESULT CALLED TO, READ BACK BY AND VERIFIED WITH: BELK,N 1814 10/30/2020 COLEMAN,R    Culture    Final    CULTURE REINCUBATED FOR BETTER GROWTH Performed at [facility], [place] [address], [place], [place] [address]    Report Status PENDING  Incomplete  Blood Culture ID Panel (Reflexed)     Status: None   Collection Time: 10/29/20  4:01 PM  Result Value Ref Range Status   Enterococcus faecalis NOT DETECTED NOT DETECTED Final   Enterococcus Faecium NOT DETECTED NOT DETECTED Final   Listeria monocytogenes NOT DETECTED NOT DETECTED Final   Staphylococcus species NOT DETECTED NOT DETECTED Final   Staphylococcus aureus (BCID) NOT DETECTED NOT DETECTED Final   Staphylococcus epidermidis NOT DETECTED NOT DETECTED Final   Staphylococcus lugdunensis NOT DETECTED NOT DETECTED Final   Streptococcus species NOT DETECTED NOT DETECTED Final   Streptococcus agalactiae NOT DETECTED NOT DETECTED Final   Streptococcus pneumoniae NOT DETECTED NOT DETECTED Final   Streptococcus pyogenes NOT DETECTED NOT DETECTED Final   A.calcoaceticus-baumannii NOT DETECTED NOT DETECTED Final   Bacteroides fragilis NOT DETECTED NOT DETECTED Final   Enterobacterales NOT DETECTED NOT DETECTED Final   Enterobacter cloacae complex NOT DETECTED NOT DETECTED Final   Escherichia coli NOT DETECTED NOT DETECTED Final   Klebsiella aerogenes NOT DETECTED NOT DETECTED Final   Klebsiella oxytoca NOT DETECTED NOT DETECTED Final   Klebsiella pneumoniae NOT DETECTED NOT DETECTED Final   Proteus species NOT DETECTED NOT DETECTED Final   Salmonella species NOT DETECTED NOT DETECTED Final   Serratia marcescens NOT DETECTED NOT DETECTED Final   Haemophilus influenzae NOT DETECTED NOT DETECTED Final   Neisseria meningitidis NOT DETECTED NOT DETECTED Final   Pseudomonas aeruginosa NOT DETECTED NOT DETECTED Final   Stenotrophomonas maltophilia NOT DETECTED NOT DETECTED Final   Candida albicans NOT DETECTED NOT DETECTED Final   Candida auris NOT DETECTED NOT DETECTED Final   Candida glabrata NOT DETECTED NOT DETECTED Final    Candida krusei NOT DETECTED NOT DETECTED Final   Candida parapsilosis NOT DETECTED NOT DETECTED Final   Candida tropicalis NOT DETECTED NOT DETECTED Final   Cryptococcus neoformans/gattii NOT DETECTED NOT DETECTED Final    Comment: Performed at [facility], [address] [place], [place] [address]     Labs: Basic Metabolic Panel: Recent Labs  Lab 10/29/20 1330 10/29/20 1601 10/30/20 0604 10/31/20 0722  NA 136  --  137 140  K 2.9*  --  4.2 4.4  CL 95*  --  101 102  CO2 31  --  28 29  GLUCOSE 104*  --  131* 137*  BUN 16  --  17 20  CREATININE 0.99  --  0.76 0.77  CALCIUM 8.6*  --  8.6* 9.0  MG  --  1.5* 2.0 2.2  Liver Function Tests: Recent Labs  Lab 10/29/20 1330 10/30/20 0604 10/31/20 0722  AST 18 17 18   ALT 18 18 18   ALKPHOS 47 45 45  BILITOT 1.1 0.6 0.7  PROT 6.6 6.6 6.8  ALBUMIN 3.3* 3.0* 3.3*   No results for input(s): LIPASE, AMYLASE in the last 168 hours. No results for input(s): AMMONIA in the last 168 hours. CBC: Recent Labs  Lab 10/29/20 1330 10/30/20 0604 10/31/20 0722  WBC 11.6* 10.3 18.5*  NEUTROABS 8.4* 8.8* 16.4*  HGB 13.4 13.6 14.9  HCT 43.2 43.4 46.7  MCV 97.5 96.4 96.5  PLT 260 255 274   Cardiac Enzymes: No results for input(s): CKTOTAL, CKMB, CKMBINDEX, TROPONINI in the last 168 hours. BNP: Invalid input(s): POCBNP CBG: No results for input(s): GLUCAP in the last 168 hours.  Time coordinating discharge:  36 minutes  Signed:  Orson Eva, DO [facility] Pager: [phone] 10/31/2020, 10:07 AM

## 2020-11-01 NOTE — Progress Notes (Signed)
Notified that 10/29/20 blood cultures grew Pseudomonas oryzihabitans in one of 2 sets -this organism usually represents a contaminant -true infections very rarely reported in immunocompromised patients with active malignancy on chemo, AIDS, CAPD patients and patient with catheter associated bacteremia none of which this patient has -patient was clinically improving without any antibacterial meds -represent contaminant in this patient -will not start antibiotics  DTat

## 2020-11-03 LAB — CULTURE, BLOOD (ROUTINE X 2): Culture: NO GROWTH

## 2020-11-05 ENCOUNTER — Telehealth: Payer: Self-pay | Admitting: Cardiology

## 2020-11-05 NOTE — Telephone Encounter (Signed)
Patient called concerned because he was in [facility] and they removed his Losartan Meds and sent him home with 7 days Prednisone. His BP has been running 165-185/115. He has been taking his meds back as he was before his hospital admit. Call patient to confirm he is taking his meds as he should be. He does have f/u appt scheduled for 11/08/20.

## 2020-11-05 NOTE — Telephone Encounter (Signed)
Thank you for the update.  As long as he is able to check his blood pressure and keep an eye on things, I think that is fine.

## 2020-11-05 NOTE — Telephone Encounter (Signed)
Per hospitalist note, pt's losartan-hctz was d/c due to soft bp's. Pt was prescribed prednisone for his lungs due to Covid. Pt states that his SBP's have been 160-185 while DBP is around 115. Pt re-started his losartan-hctz after he spoke to his pharmacist. He is also taking Toprol-XL 50 mg BID. Pt has an appointment with Dr. Domenic Polite on 11/08/20. I will FYI Dr. Domenic Polite.

## 2020-11-07 NOTE — Progress Notes (Deleted)
Cardiology Office Note  Date: 11/07/2020   ID: Matthew Hall, DOB 18-Aug-1945, MRN [id number]  PCP:  [name], MD  Cardiologist:  Rozann Lesches, MD Electrophysiologist:  None   No chief complaint on file.   History of Present Illness: Matthew Hall is a 75 y.o. male last seen in October 2021.  He was recently hospitalized with COVID-19 pneumonia associated with acute hypoxic respiratory failure.  Supportive measures including steroids and remdesivir.  Losartan HCTZ temporarily held due to low blood pressures, however this has subsequently been resumed as an outpatient.  He is on Coumadin with follow-up by PCP.  Past Medical History:  Diagnosis Date  . Anxiety   . Aortic atherosclerosis ([place])   . Atrial fibrillation ([place])   . Basal cell carcinoma   . Bladder cancer ([place])   . Bladder tumor   . Colon polyps   . COPD (chronic obstructive pulmonary disease) ([place])   . Cutaneous vasculitis   . Diverticulosis   . Essential hypertension   . GERD (gastroesophageal reflux disease)   . History of pneumonia   . Nephrolithiasis   . Osteoarthritis     Past Surgical History:  Procedure Laterality Date  . COLONOSCOPY    . Kidney stones removed  1996  . SKIN CANCER EXCISION Left    2003  . TRANSURETHRAL RESECTION OF BLADDER TUMOR  1996    Current Outpatient Medications  Medication Sig Dispense Refill  . ALPRAZolam (XANAX) 0.25 MG tablet Take 0.25 mg by mouth 2 (two) times daily as needed for anxiety or sleep.   3  . budesonide-formoterol (SYMBICORT) 160-4.5 MCG/ACT inhaler Inhale 2 puffs into the lungs 2 (two) times daily. 1 Inhaler 6  . cetirizine (ZYRTEC) 5 MG tablet Take 5 mg by mouth daily.    . Cyanocobalamin (VITAMIN B12 PO) Take 1 tablet by mouth daily.    . D3-50 50000 units capsule Take 50,000 Units by mouth once a week. Usually Tuesday or Wednesday.  0  . levalbuterol (XOPENEX) 0.63 MG/3ML nebulizer solution Take 3 mLs (0.63 mg total) by nebulization  every 6 (six) hours as needed for wheezing or shortness of breath. 75 mL 6  . metoprolol succinate (TOPROL-XL) 50 MG 24 hr tablet Take 1 tablet (50 mg total) by mouth 2 (two) times daily. 60 tablet 0  . neomycin-polymyxin b-dexamethasone (MAXITROL) 3.0-16010-9.3 OINT 1 application at bedtime.    Marland Kitchen omeprazole (PRILOSEC) 40 MG capsule Take 40 mg by mouth daily.    . predniSONE (DELTASONE) 50 MG tablet Take 1 tablet (50 mg total) by mouth daily with breakfast. 7 tablet 0  . triamcinolone (NASACORT) 55 MCG/ACT AERO nasal inhaler Place 1 spray into the nose daily as needed (Congestion).     . warfarin (COUMADIN) 2.5 MG tablet Take 2.5 mg by mouth daily.    Marland Kitchen zinc gluconate 50 MG tablet Take 50 mg by mouth daily.     No current facility-administered medications for this visit.   Allergies:  Simvastatin, Albuterol, Augmentin [amoxicillin-pot clavulanate], Ciprofloxacin, Contrast media [iodinated diagnostic agents], Flagyl [metronidazole], Latex, and Sulfa antibiotics   Social History: The patient  reports that he quit smoking about 9 years ago. His smoking use included cigarettes. He has a 30.00 pack-year smoking history. He has never used smokeless tobacco. He reports that he does not drink alcohol and does not use drugs.   Family History: The patient's family history includes CAD in his mother; Diabetes in his mother and sister; Emphysema in his  father; Liver cancer in his brother; Lung cancer in his brother; Stroke in his sister.   ROS:  Please see the history of present illness. Otherwise, complete review of systems is positive for {NONE DEFAULTED:18576::"none"}.  All other systems are reviewed and negative.   Physical Exam: VS:  There were no vitals taken for this visit., BMI There is no height or weight on file to calculate BMI.  Wt Readings from Last 3 Encounters:  10/29/20 178 lb 2.1 oz (80.8 kg)  05/04/20 185 lb (83.9 kg)  10/30/19 184 lb 12.8 oz (83.8 kg)    General: Patient appears  comfortable at rest. HEENT: Conjunctiva and lids normal, oropharynx clear with moist mucosa. Neck: Supple, no elevated JVP or carotid bruits, no thyromegaly. Lungs: Clear to auscultation, nonlabored breathing at rest. Cardiac: Regular rate and rhythm, no S3 or significant systolic murmur, no pericardial rub. Abdomen: Soft, nontender, no hepatomegaly, bowel sounds present, no guarding or rebound. Extremities: No pitting edema, distal pulses 2+. Skin: Warm and dry. Musculoskeletal: No kyphosis. Neuropsychiatric: Alert and oriented x3, affect grossly appropriate.  ECG:  An ECG dated 10/30/2019 was personally reviewed today and demonstrated:  Atrial fibrillation with IVCD, leftward axis, nonspecific ST changes.  Recent Labwork: 10/31/2020: ALT 18; AST 18; BUN 20; Creatinine, Ser 0.77; Hemoglobin 14.9; Magnesium 2.2; Platelets 274; Potassium 4.4; Sodium 140     Component Value Date/Time   TRIG 75 10/29/2020 1635    Other Studies Reviewed Today:  Echocardiogram 07/09/2016: - Left ventricle: The cavity size was normal. Wall thickness was  increased in a pattern of mild LVH. Systolic function was  vigorous. The estimated ejection fraction was in the range of 65%  to 70%. Wall motion was normal; there were no regional wall  motion abnormalities. The study is not technically sufficient to  allow evaluation of LV diastolic function.  - Mitral valve: Calcified annulus.  - Left atrium: The atrium was moderately dilated.  - Right atrium: The atrium was moderately dilated.   Assessment and Plan:    Medication Adjustments/Labs and Tests Ordered: Current medicines are reviewed at length with the patient today.  Concerns regarding medicines are outlined above.   Tests Ordered: No orders of the defined types were placed in this encounter.   Medication Changes: No orders of the defined types were placed in this encounter.   Disposition:  Follow up {follow  up:15908}  Signed, Satira Sark, MD, [facility] 11/07/2020 8:22 PM    [facility] [place], [place], [place] [address] Phone: [phone]; Fax: [phone]

## 2020-11-08 ENCOUNTER — Telehealth: Payer: Self-pay

## 2020-11-08 ENCOUNTER — Encounter: Payer: Self-pay | Admitting: Cardiology

## 2020-11-08 ENCOUNTER — Other Ambulatory Visit: Payer: Self-pay

## 2020-11-08 ENCOUNTER — Ambulatory Visit: Payer: Medicare Other | Admitting: Cardiology

## 2020-11-08 ENCOUNTER — Telehealth (INDEPENDENT_AMBULATORY_CARE_PROVIDER_SITE_OTHER): Payer: Medicare Other | Admitting: Cardiology

## 2020-11-08 ENCOUNTER — Telehealth: Payer: Self-pay | Admitting: Cardiology

## 2020-11-08 VITALS — BP 135/80 | HR 78 | Wt 178.0 lb

## 2020-11-08 DIAGNOSIS — I4821 Permanent atrial fibrillation: Secondary | ICD-10-CM

## 2020-11-08 DIAGNOSIS — I10 Essential (primary) hypertension: Secondary | ICD-10-CM

## 2020-11-08 NOTE — Telephone Encounter (Signed)
Pt scheduled for phone appt

## 2020-11-08 NOTE — Telephone Encounter (Signed)
  Patient Consent for Virtual Visit         Matthew Hall has provided verbal consent on 11/08/2020 for a virtual visit (video or telephone).   CONSENT FOR VIRTUAL VISIT FOR:  Matthew Hall  By participating in this virtual visit I agree to the following:  I hereby voluntarily request, consent and authorize [facility] and its employed or contracted physicians, physician assistants, nurse practitioners or other licensed health care professionals (the Practitioner), to provide me with telemedicine health care services (the "Services") as deemed necessary by the treating Practitioner. I acknowledge and consent to receive the Services by the Practitioner via telemedicine. I understand that the telemedicine visit will involve communicating with the Practitioner through live audiovisual communication technology and the disclosure of certain medical information by electronic transmission. I acknowledge that I have been given the opportunity to request an in-person assessment or other available alternative prior to the telemedicine visit and am voluntarily participating in the telemedicine visit.  I understand that I have the right to withhold or withdraw my consent to the use of telemedicine in the course of my care at any time, without affecting my right to future care or treatment, and that the Practitioner or I may terminate the telemedicine visit at any time. I understand that I have the right to inspect all information obtained and/or recorded in the course of the telemedicine visit and may receive copies of available information for a reasonable fee.  I understand that some of the potential risks of receiving the Services via telemedicine include:  Marland Kitchen Delay or interruption in medical evaluation due to technological equipment failure or disruption; . Information transmitted may not be sufficient (e.g. poor resolution of images) to allow for appropriate medical decision making by the  Practitioner; and/or  . In rare instances, security protocols could fail, causing a breach of personal health information.  Furthermore, I acknowledge that it is my responsibility to provide information about my medical history, conditions and care that is complete and accurate to the best of my ability. I acknowledge that Practitioner's advice, recommendations, and/or decision may be based on factors not within their control, such as incomplete or inaccurate data provided by me or distortions of diagnostic images or specimens that may result from electronic transmissions. I understand that the practice of medicine is not an exact science and that Practitioner makes no warranties or guarantees regarding treatment outcomes. I acknowledge that a copy of this consent can be made available to me via my patient portal ([facility]), or I can request a printed copy by calling the office of [facility].    I understand that my insurance will be billed for this visit.   I have read or had this consent read to me. . I understand the contents of this consent, which adequately explains the benefits and risks of the Services being provided via telemedicine.  . I have been provided ample opportunity to ask questions regarding this consent and the Services and have had my questions answered to my satisfaction. . I give my informed consent for the services to be provided through the use of telemedicine in my medical care

## 2020-11-08 NOTE — Telephone Encounter (Signed)
Unable to reach patient at this time. I will attempt to reach him when he gets home.

## 2020-11-08 NOTE — Progress Notes (Signed)
Virtual Visit via Telephone Note   This visit type was conducted due to national recommendations for restrictions regarding the COVID-19 Pandemic (e.g. social distancing) in an effort to limit this patient's exposure and mitigate transmission in our community.  Due to his co-morbid illnesses, this patient is at least at moderate risk for complications without adequate follow up.  This format is felt to be most appropriate for this patient at this time.  The patient did not have access to video technology/had technical difficulties with video requiring transitioning to audio format only (telephone).  All issues noted in this document were discussed and addressed.  No physical exam could be performed with this format.  Please refer to the patient's chart for his  consent to telehealth for [facility].    Date:  11/08/2020   ID:  Matthew Hall, DOB [birth year], MRN [id number] The patient was identified using 2 identifiers.  Patient Location: Home Provider Location: Home Office   PCP:  [name], [place]  Cardiologist:  Rozann Lesches, MD  [phone]   Evaluation Performed:  Follow-Up Visit  Chief Complaint:  Cardiac follow-up  History of Present Illness:    Matthew Hall is a 75 y.o. male last seen in October 2021.  We spoke by phone today.  He was recently hospitalized with COVID-19 pneumonia and associated hypoxic respiratory failure.  He was treated with supportive measures including steroids and remdesivir.  Also temporarily taken off of losartan HCTZ due to low blood pressures, this has been subsequently resumed at home.  Still tracking blood pressure at home, and takes a half losartan HCTZ tablet at times.  We discussed continuing to track blood pressure as he gets further out from his recent illness.  From a cardiac perspective, he does not report any chest pain or palpitations.  I did review his lab work and ECG from recent  stay.   Past Medical History:  Diagnosis Date  . Anxiety   . Aortic atherosclerosis ([place])   . Atrial fibrillation ([place])   . Basal cell carcinoma   . Bladder cancer ([place])   . Bladder tumor   . Colon polyps   . COPD (chronic obstructive pulmonary disease) ([place])   . Cutaneous vasculitis   . Diverticulosis   . Essential hypertension   . GERD (gastroesophageal reflux disease)   . History of pneumonia   . Nephrolithiasis   . Osteoarthritis    Past Surgical History:  Procedure Laterality Date  . COLONOSCOPY    . Kidney stones removed  1996  . SKIN CANCER EXCISION Left    2003  . TRANSURETHRAL RESECTION OF BLADDER TUMOR  1996     Current Meds  Medication Sig  . ALPRAZolam (XANAX) 0.25 MG tablet Take 0.25 mg by mouth 2 (two) times daily as needed for anxiety or sleep.   . budesonide-formoterol (SYMBICORT) 160-4.5 MCG/ACT inhaler Inhale 2 puffs into the lungs 2 (two) times daily.  . cetirizine (ZYRTEC) 5 MG tablet Take 5 mg by mouth daily.  . Cyanocobalamin (VITAMIN B12 PO) Take 1 tablet by mouth daily.  . D3-50 50000 units capsule Take 50,000 Units by mouth once a week. Usually Tuesday or Wednesday.  . levalbuterol (XOPENEX) 0.63 MG/3ML nebulizer solution Take 3 mLs (0.63 mg total) by nebulization every 6 (six) hours as needed for wheezing or shortness of breath.  . losartan-hydrochlorothiazide (HYZAAR) 50-12.5 MG tablet Take 1 tablet by mouth daily. Pt takes 1/2 tablet daily  .  metoprolol succinate (TOPROL-XL) 50 MG 24 hr tablet Take 1 tablet (50 mg total) by mouth 2 (two) times daily.  Marland Kitchen neomycin-polymyxin b-dexamethasone (MAXITROL) 1.6-10960-4.5 OINT 1 application at bedtime.  Marland Kitchen omeprazole (PRILOSEC) 40 MG capsule Take 40 mg by mouth daily.  . predniSONE (DELTASONE) 50 MG tablet Take 1 tablet (50 mg total) by mouth daily with breakfast.  . triamcinolone (NASACORT) 55 MCG/ACT AERO nasal inhaler Place 1 spray into the nose daily as needed (Congestion).   . warfarin (COUMADIN) 2.5  MG tablet Take 2.5 mg by mouth daily.  Marland Kitchen zinc gluconate 50 MG tablet Take 50 mg by mouth daily.     Allergies:   Simvastatin, Albuterol, Augmentin [amoxicillin-pot clavulanate], Ciprofloxacin, Contrast media [iodinated diagnostic agents], Flagyl [metronidazole], Latex, and Sulfa antibiotics    ROS:  No syncope.   Prior CV studies:   The following studies were reviewed today:  Echocardiogram 07/09/2016: - Left ventricle: The cavity size was normal. Wall thickness was  increased in a pattern of mild LVH. Systolic function was  vigorous. The estimated ejection fraction was in the range of 65%  to 70%. Wall motion was normal; there were no regional wall  motion abnormalities. The study is not technically sufficient to  allow evaluation of LV diastolic function.  - Mitral valve: Calcified annulus.  - Left atrium: The atrium was moderately dilated.  - Right atrium: The atrium was moderately dilated.   Labs/Other Tests and Data Reviewed:    EKG:  An ECG dated 10/29/2020 was personally reviewed today and demonstrated:  Rate controlled atrial fibrillation with PVCs, left anterior fascicular block, IVCD.  Recent Labs: 10/31/2020: ALT 18; BUN 20; Creatinine, Ser 0.77; Hemoglobin 14.9; Magnesium 2.2; Platelets 274; Potassium 4.4; Sodium 140   Recent Lipid Panel Lab Results  Component Value Date/Time   TRIG 75 10/29/2020 04:35 PM    Wt Readings from Last 3 Encounters:  11/08/20 178 lb (80.7 kg)  10/29/20 178 lb 2.1 oz (80.8 kg)  05/04/20 185 lb (83.9 kg)     Objective:    Vital Signs:  BP 135/80   Pulse 78   Wt 178 lb (80.7 kg)   BMI 24.83 kg/m    Patient spoke in full sentences, not short of breath on the phone.  No coughing or wheezing audible.  ASSESSMENT & PLAN:    1.  Permanent atrial fibrillation with CHA2DS2-VASc score of 3.  No sense of palpitations on current regimen including Toprol-XL.  He remains on Coumadin for stroke prophylaxis with follow-up by  PCP.  2.  Essential hypertension, now transitioning back onto Hyzaar along with Toprol-XL as discussed above.  I asked him to keep check on blood pressure with automatic cuff at home.    Time:   Today, I have spent 8 minutes with the patient with telehealth technology discussing the above problems.     Medication Adjustments/Labs and Tests Ordered: Current medicines are reviewed at length with the patient today.  Concerns regarding medicines are outlined above.   Tests Ordered: No orders of the defined types were placed in this encounter.   Medication Changes: No orders of the defined types were placed in this encounter.   Follow Up:  In Person 6 months.  Signed, Rozann Lesches, MD  11/08/2020 10:26 AM    [place]

## 2020-11-08 NOTE — Patient Instructions (Signed)
Medication Instructions:  Your physician recommends that you continue on your current medications as directed. Please refer to the Current Medication list given to you today.  *If you need a refill on your cardiac medications before your next appointment, please call your pharmacy*   Lab Work: None today If you have labs (blood work) drawn today and your tests are completely normal, you will receive your results only by: Marland Kitchen MyChart Message (if you have MyChart) OR . A paper copy in the mail If you have any lab test that is abnormal or we need to change your treatment, we will call you to review the results.   Testing/Procedures: None today   Follow-Up: At [facility], you and your health needs are our priority.  As part of our continuing mission to provide you with exceptional heart care, we have created designated Provider Care Teams.  These Care Teams include your primary Cardiologist (physician) and Advanced Practice Providers (APPs -  Physician Assistants and Nurse Practitioners) who all work together to provide you with the care you need, when you need it.  We recommend signing up for the patient portal called "MyChart".  Sign up information is provided on this After Visit Summary.  MyChart is used to connect with patients for Virtual Visits (Telemedicine).  Patients are able to view lab/test results, encounter notes, upcoming appointments, etc.  Non-urgent messages can be sent to your provider as well.   To learn more about what you can do with MyChart, go to [email or web address].    Your next appointment:   6 month(s)  The format for your next appointment:   In Person  Provider:   Rozann Lesches, MD   Other Instructions none

## 2020-11-08 NOTE — Telephone Encounter (Signed)
Patient walked in for his appointment -was tested positive on 10/29/2020. He has re-scheduled the appointment. Was in [facility] hospital was told to follow up in regards to changing his BP medication.

## 2020-11-12 ENCOUNTER — Other Ambulatory Visit (HOSPITAL_COMMUNITY): Payer: Self-pay | Admitting: Podiatry

## 2020-11-12 DIAGNOSIS — I739 Peripheral vascular disease, unspecified: Secondary | ICD-10-CM

## 2020-11-17 ENCOUNTER — Ambulatory Visit (HOSPITAL_COMMUNITY)
Admission: RE | Admit: 2020-11-17 | Discharge: 2020-11-17 | Disposition: A | Discharge status: Home / Self Care | Payer: Medicare Other | Source: Ambulatory Visit | Attending: Podiatry | Admitting: Podiatry

## 2020-11-17 DIAGNOSIS — I739 Peripheral vascular disease, unspecified: Secondary | ICD-10-CM | POA: Insufficient documentation

## 2020-11-19 ENCOUNTER — Ambulatory Visit: Payer: Medicare Other | Admitting: Family Medicine

## 2020-12-01 ENCOUNTER — Ambulatory Visit: Payer: Medicare Other | Admitting: Neurology

## 2020-12-01 ENCOUNTER — Encounter: Payer: Self-pay | Admitting: Neurology

## 2020-12-01 VITALS — BP 121/7 | HR 91 | Ht 71.0 in | Wt 177.0 lb

## 2020-12-01 DIAGNOSIS — E538 Deficiency of other specified B group vitamins: Secondary | ICD-10-CM | POA: Diagnosis not present

## 2020-12-01 DIAGNOSIS — R202 Paresthesia of skin: Secondary | ICD-10-CM | POA: Diagnosis not present

## 2020-12-01 NOTE — Progress Notes (Signed)
Reason for visit: Numbness in the feet  Referring physician: Dr. Liana Crocker is a 75 y.o. male  History of present illness:  Matthew Hall is a 75 year old right-handed white male with a history of hypertension, COPD, vasculitis, and diabetes.  The patient indicated that his problem with vasculitis started about 3 or 4 years ago associated with a rash throughout the body, particularly on the ankles.  The patient was treated with prednisone, but this unfortunately has led to an elevation of blood sugars, his most recent hemoglobin A1c has been 6.9.  The patient is not on any medications for diabetes, he is diet controlled.  About 1 year ago, he began noting some numbness in the toes, and pins-and-needles feelings.  He will have some pain in the feet that is mainly noted with ambulation, not when he is off of his feet.  He has a low-grade infection in the left fifth toe that causes most of his pain.  The patient denies any weakness of the extremities, he has not had any balance issues and no falls.  He denies issues controlling the bowels or the bladder.  He denies back pain currently but in the past he has had some sciatica pain down the right leg that is no longer a problem for him.  He denies any numbness in the hands but occasionally he will have some cramps in the hands.  He is sent to this office for evaluation of a possible peripheral neuropathy.   Past Medical History:  Diagnosis Date  . Anxiety   . Aortic atherosclerosis ([place])   . Atrial fibrillation ([place])   . Basal cell carcinoma   . Bladder cancer ([place])   . Bladder tumor   . Colon polyps   . COPD (chronic obstructive pulmonary disease) ([place])   . Cutaneous vasculitis   . Diverticulosis   . Essential hypertension   . GERD (gastroesophageal reflux disease)   . History of pneumonia   . Nephrolithiasis   . Osteoarthritis     Past Surgical History:  Procedure Laterality Date  . COLONOSCOPY    . Kidney stones  removed  1996  . SKIN CANCER EXCISION Left    2003  . TRANSURETHRAL RESECTION OF BLADDER TUMOR  1996    Family History  Problem Relation Age of Onset  . Emphysema Father   . Lung cancer Brother   . Liver cancer Brother   . Diabetes Mother   . CAD Mother   . Diabetes Sister   . Stroke Sister   . Colon cancer Neg Hx     Social history:  reports that he quit smoking about 9 years ago. His smoking use included cigarettes. He has a 30.00 pack-year smoking history. He has never used smokeless tobacco. He reports that he does not drink alcohol and does not use drugs.  Medications:  Prior to Admission medications   Medication Sig Start Date End Date Taking? Authorizing Provider  ALPRAZolam (XANAX) 0.25 MG tablet Take 0.25 mg by mouth 2 (two) times daily as needed for anxiety or sleep.  07/01/14  Yes [provider]  budesonide-formoterol (SYMBICORT) 160-4.5 MCG/ACT inhaler Inhale 2 puffs into the lungs 2 (two) times daily. 12/01/14  Yes Rigoberto Noel, MD  cetirizine (ZYRTEC) 5 MG tablet Take 5 mg by mouth daily.   Yes [provider]  Cyanocobalamin (VITAMIN B12 PO) Take 1 tablet by mouth daily.   Yes [provider]  D3-50 50000 units capsule  Take 50,000 Units by mouth once a week. Usually Tuesday or Wednesday. 11/23/17  Yes [provider]  levalbuterol (XOPENEX) 0.63 MG/3ML nebulizer solution Take 3 mLs (0.63 mg total) by nebulization every 6 (six) hours as needed for wheezing or shortness of breath. 07/25/19  Yes Lauraine Rinne, NP  losartan-hydrochlorothiazide (HYZAAR) 50-12.5 MG tablet Take 1 tablet by mouth daily. Pt takes 1/2 tablet daily   Yes [provider]  metoprolol succinate (TOPROL-XL) 50 MG 24 hr tablet Take 1 tablet (50 mg total) by mouth 2 (two) times daily. 07/11/16  Yes Thurnell Lose, MD  neomycin-polymyxin b-dexamethasone (MAXITROL) 4.0-97353-2.9 OINT 1 application at bedtime. 06/24/20  Yes [provider]  omeprazole  (PRILOSEC) 40 MG capsule Take 40 mg by mouth daily.   Yes [provider]  predniSONE (DELTASONE) 50 MG tablet Take 1 tablet (50 mg total) by mouth daily with breakfast. 11/01/20  Yes Tat, Shanon Brow, MD  triamcinolone (NASACORT) 55 MCG/ACT AERO nasal inhaler Place 1 spray into the nose daily as needed (Congestion).    Yes [provider]  warfarin (COUMADIN) 2.5 MG tablet Take 2.5 mg by mouth daily.   Yes [provider]  zinc gluconate 50 MG tablet Take 50 mg by mouth daily.   Yes [provider]      Allergies  Allergen Reactions  . Simvastatin Rash  . Albuterol Other (See Comments)    Increases heartrate!!!   . Augmentin [Amoxicillin-Pot Clavulanate] Rash  . Ciprofloxacin Rash  . Contrast Media [Iodinated Diagnostic Agents] Rash  . Flagyl [Metronidazole] Rash  . Latex Hives and Rash  . Sulfa Antibiotics Rash    ROS:  Out of a complete 14 system review of symptoms, the patient complains only of the following symptoms, and all other reviewed systems are negative.  Numbness in the feet Foot pain  Blood pressure (!) 121/7, pulse 91, height 5\' 11"  (1.803 m), weight 177 lb (80.3 kg).  Physical Exam  General: The patient is alert and cooperative at the time of the examination.  Eyes: Pupils are equal, round, and reactive to light. Discs are flat bilaterally.  Neck: The neck is supple, no carotid bruits are noted.  Respiratory: The respiratory examination is clear.  Cardiovascular: The cardiovascular examination reveals a regular rate and rhythm, no obvious murmurs or rubs are noted.  Skin: Extremities are without significant edema.  Neurologic Exam  Mental status: The patient is alert and oriented x 3 at the time of the examination. The patient has apparent normal recent and remote memory, with an apparently normal attention span and concentration ability.  Cranial nerves: Facial symmetry is present. There is good sensation of the face to  pinprick and soft touch bilaterally. The strength of the facial muscles and the muscles to head turning and shoulder shrug are normal bilaterally. Speech is well enunciated, no aphasia or dysarthria is noted. Extraocular movements are full, with exception of the severe impairment of superior gaze. Visual fields are full. The tongue is midline, and the patient has symmetric elevation of the soft palate. No obvious hearing deficits are noted.  Motor: The motor testing reveals 5 over 5 strength of all 4 extremities. Good symmetric motor tone is noted throughout.  Sensory: Sensory testing is intact to pinprick, soft touch, vibration sensation, and position sense on the upper extremities, with exception some decreased pinprick sensation on the left arm as compared to the right.  With the lower extremities, there is a stocking pattern pinprick sensory deficit  in the distal third of the legs, decreased pinprick sensation in general on the left leg as compared to the right.  Vibration sensation is decreased on the left foot as compared to the right, position sensation is decreased on the right foot as compared to the left. No evidence of extinction is noted.  Coordination: Cerebellar testing reveals good finger-nose-finger bilaterally.  Some dysmetria is noted with heel-to-shin bilaterally.  Gait and station: Gait is normal. Tandem gait is normal. Romberg is negative. No drift is seen.  Reflexes: Deep tendon reflexes are symmetric, but are depressed bilaterally. Toes are downgoing bilaterally.   Assessment/Plan:  1.  Bilateral foot numbness, possible peripheral neuropathy  2. Diabetes, diet controlled  3.  History of vasculitis  The patient will be sent for blood work today.  He will be set up for nerve conduction studies on both legs and EMG on the right leg.  He will follow-up for the above evaluation.  Jill Alexanders MD 12/01/2020 9:05 AM  [facility] [address] [place], [place] [address]  Phone [phone] Fax [phone]

## 2020-12-07 LAB — VITAMIN B12: Vitamin B-12: >2000 pg/mL — ABNORMAL HIGH (ref 232–1245)

## 2020-12-07 LAB — MULTIPLE MYELOMA PANEL, SERUM
Albumin SerPl Elph-Mcnc: 3.3 g/dL (ref 2.9–4.4)
Albumin/Glob SerPl: 1 (ref 0.7–1.7)
Alpha 1: 0.3 g/dL (ref 0.0–0.4)
Alpha2 Glob SerPl Elph-Mcnc: 0.9 g/dL (ref 0.4–1.0)
B-Globulin SerPl Elph-Mcnc: 1.3 g/dL (ref 0.7–1.3)
Gamma Glob SerPl Elph-Mcnc: 1 g/dL (ref 0.4–1.8)
Globulin, Total: 3.4 g/dL (ref 2.2–3.9)
IgA/Immunoglobulin A, Serum: 534 mg/dL — ABNORMAL HIGH (ref 61–437)
IgG (Immunoglobin G), Serum: 981 mg/dL (ref 603–1613)
IgM (Immunoglobulin M), Srm: 43 mg/dL (ref 15–143)
Total Protein: 6.7 g/dL (ref 6.0–8.5)

## 2020-12-07 LAB — PAN-ANCA
ANCA Proteinase 3: <3.5 U/mL (ref 0.0–3.5)
Atypical pANCA: <1:20 {titer}
C-ANCA: <1:20 {titer}
Myeloperoxidase Ab: <9 U/mL (ref 0.0–9.0)
P-ANCA: <1:20 {titer}

## 2020-12-07 LAB — HEPATITIS C ANTIBODY: Hep C Virus Ab: <0.1 s/co ratio (ref 0.0–0.9)

## 2020-12-07 LAB — ANGIOTENSIN CONVERTING ENZYME: Angio Convert Enzyme: 32 U/L (ref 14–82)

## 2020-12-07 LAB — LYME DISEASE SEROLOGY W/REFLEX: Lyme Total Antibody EIA: NEGATIVE

## 2020-12-07 LAB — COPPER, SERUM: Copper: 142 ug/dL — ABNORMAL HIGH (ref 69–132)

## 2020-12-07 LAB — SEDIMENTATION RATE: Sed Rate: 8 mm/hr (ref 0–30)

## 2020-12-07 LAB — ANA W/REFLEX: Anti Nuclear Antibody (ANA): NEGATIVE

## 2020-12-27 ENCOUNTER — Encounter: Payer: Self-pay | Admitting: Neurology

## 2020-12-27 ENCOUNTER — Ambulatory Visit: Payer: Medicare Other | Admitting: Neurology

## 2020-12-27 ENCOUNTER — Ambulatory Visit (INDEPENDENT_AMBULATORY_CARE_PROVIDER_SITE_OTHER): Payer: Medicare Other | Admitting: Neurology

## 2020-12-27 ENCOUNTER — Other Ambulatory Visit: Payer: Self-pay

## 2020-12-27 DIAGNOSIS — E1142 Type 2 diabetes mellitus with diabetic polyneuropathy: Secondary | ICD-10-CM | POA: Insufficient documentation

## 2020-12-27 DIAGNOSIS — R202 Paresthesia of skin: Secondary | ICD-10-CM

## 2020-12-27 HISTORY — DX: Type 2 diabetes mellitus with diabetic polyneuropathy: E11.42

## 2020-12-27 NOTE — Progress Notes (Addendum)
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  EMG of the right lower extremity shows distal chronic and acute denervation consistent with a peripheral neuropathy.  The patient is not having a lot of discomfort, mainly numbness, he will let me know if pain ensues and we can start a medication at that time.      [place]    Nerve / Sites Muscle Latency Ref. Amplitude Ref. Rel Amp Segments Distance Velocity Ref. Area    ms ms mV mV %  cm m/s m/s mVms  L Peroneal - EDB     Ankle EDB NR ?6.5 NR ?2.0 NR Ankle - EDB 9   NR     Fib head EDB NR  NR  NR Fib head - Ankle 30 NR ?44 NR     Pop fossa EDB NR  NR  NR Pop fossa - Fib head 10 NR ?44 NR         Pop fossa - Ankle      R Peroneal - EDB     Ankle EDB 6.0 ?6.5 0.6 ?2.0 100 Ankle - EDB 9   2.3     Fib head EDB 16.4  0.6  102 Fib head - Ankle 30 29 ?44 1.7     Pop fossa EDB 19.8  0.5  91.6 Pop fossa - Fib head 10 29 ?44 2.0         Pop fossa - Ankle      L Tibial - AH     Ankle AH 4.4 ?5.8 1.6 ?4.0 100 Ankle - AH 9   7.2     Pop fossa AH 17.7  1.0  61.6 Pop fossa - Ankle 43 32 ?41 5.7  R Tibial - AH     Ankle AH 4.8 ?5.8 3.4 ?4.0 100 Ankle - AH 9   10.6     Pop fossa AH 18.5  3.0  88.6 Pop fossa - Ankle 42 31 ?41 13.3             SNC    Nerve / Sites Rec. Site Peak Lat Ref.  Amp Ref. Segments Distance    ms ms V V  cm  L Sural - Ankle (Calf)     Calf Ankle NR ?4.4 NR ?6 Calf - Ankle 14  R Sural - Ankle (Calf)     Calf Ankle NR ?4.4 NR ?6 Calf - Ankle 14  L Superficial peroneal - Ankle     Lat leg Ankle NR ?4.4 NR ?6 Lat leg - Ankle 14  R Superficial peroneal - Ankle     Lat leg Ankle NR ?4.4 NR ?6 Lat leg - Ankle 14             F  Wave    Nerve F Lat Ref.   ms ms  L Tibial - AH 68.3 ?56.0  R Tibial - AH 67.9 ?56.0

## 2020-12-27 NOTE — Progress Notes (Signed)
Please refer to EMG and nerve conduction procedure note.  

## 2020-12-27 NOTE — Procedures (Signed)
     HISTORY:  Matthew Hall is a 75 year old gentleman with a history of diabetes who has reported numbness in both feet.  He has some mild burning sensations in the feet, but mainly numbness.  He has mild gait instability associated with this.  He is being evaluated for a possible peripheral neuropathy.  NERVE CONDUCTION STUDIES:  Nerve conduction studies were performed on both lower extremities.  No response was seen for the left peroneal nerve.  The distal motor latency for the right peroneal nerve was normal with a low motor amplitude.  The distal motor latencies for the posterior tibial nerves were within normal limits with low motor amplitudes seen for these nerves.  Slowing was seen for the right peroneal nerve and for the posterior tibial nerves bilaterally.  No response was seen for the sural and peroneal sensory latencies bilaterally.  The F-wave latencies for the posterior tibial nerves were prolonged bilaterally.  EMG STUDIES:  EMG study was performed on the right lower extremity:  The tibialis anterior muscle reveals 2 to 5K motor units with decreased recruitment. No fibrillations or positive waves were seen. The peroneus tertius muscle reveals 2 to 5K motor units with decreased recruitment. No fibrillations or positive waves were seen. The medial gastrocnemius muscle reveals 1 to 3K motor units with decreased recruitment. 2+ positive waves were seen. The vastus lateralis muscle reveals 2 to 4K motor units with full recruitment. No fibrillations or positive waves were seen. The iliopsoas muscle reveals 2 to 4K motor units with full recruitment. No fibrillations or positive waves were seen. The biceps femoris muscle (long head) reveals 2 to 4K motor units with full recruitment. No fibrillations or positive waves were seen. The lumbosacral paraspinal muscles were tested at 3 levels, and revealed no abnormalities of insertional activity at all 3 levels tested. There was good  relaxation.   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  EMG evaluation of the right lower extremity shows distal acute and chronic denervation consistent with the diagnosis of peripheral neuropathy.  No evidence of an overlying lumbosacral radiculopathy was seen.  Jill Alexanders MD 12/27/2020 3:56 PM  [facility] [address] [place], [place] [address]  Phone [phone] Fax [phone]

## 2021-01-03 ENCOUNTER — Other Ambulatory Visit: Payer: Self-pay

## 2021-01-03 ENCOUNTER — Ambulatory Visit: Payer: Medicare Other | Admitting: Vascular Surgery

## 2021-01-03 ENCOUNTER — Encounter: Payer: Self-pay | Admitting: Vascular Surgery

## 2021-01-03 VITALS — BP 95/61 | HR 75 | Temp 97.7°F | Resp 18 | Ht 72.0 in | Wt 175.4 lb

## 2021-01-03 DIAGNOSIS — G609 Hereditary and idiopathic neuropathy, unspecified: Secondary | ICD-10-CM | POA: Diagnosis not present

## 2021-01-03 NOTE — Progress Notes (Signed)
[facility] [place]  Patient name: Matthew Hall MRN: [id number] DOB: 12-27-1945 Sex: male  REASON FOR CONSULT: Evaluation lower extremity discomfort with possible peripheral vascular occlusive disease  HPI: Matthew Hall is a 75 y.o. male, who is here today for evaluation.  He had an episode of ingrown toenail in his left fifth toe nailbed.  This has now gone on to full healing.  He underwent noninvasive vascular laboratory studies and [facility] on 11/17/2020 and is here for discussion of these as well.  He has multiple complaints in his lower extremities.  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  Also reports numbness and tingling in feet bilaterally.  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  Also does have some history of venous reflux with telangiectasia and varicosities but no history of venous ulcer.  He does not have any claudication symptoms  Past Medical History:  Diagnosis Date   Anxiety    Aortic atherosclerosis (HCC)    Atrial fibrillation (HCC)    Basal cell carcinoma    Bladder cancer (HCC)    Bladder tumor    Colon polyps    COPD (chronic obstructive pulmonary disease) (HCC)    Cutaneous vasculitis    Diabetic peripheral neuropathy ([place]) 12/27/2020   Diverticulosis    Essential hypertension    GERD (gastroesophageal reflux disease)    History of pneumonia    Nephrolithiasis    Osteoarthritis     Family History  Problem Relation Age of Onset   Emphysema Father    Lung cancer Brother    Liver cancer Brother    Diabetes Mother    CAD Mother    Diabetes Sister    Stroke Sister    Colon cancer Neg Hx     SOCIAL HISTORY: Social History   Socioeconomic History   Marital status: Married    Spouse name: Not on file   Number of children: 3   Years of education: Not on file   Highest education level: Not on file  Occupational History    Occupation: Pipefilter/retired    Comment: [employer]  Tobacco Use   Smoking status: Former    Packs/day: 1.00    Years: 30.00    Pack years: 30.00    Types: Cigarettes    Quit date: 07/07/2011    Years since quitting: 9.5   Smokeless tobacco: Never  Vaping Use   Vaping Use: Never used  Substance and Sexual Activity   Alcohol use: No    Alcohol/week: 0.0 standard drinks   Drug use: No   Sexual activity: Yes  Other Topics Concern   Not on file  Social History Narrative   Lives with wife   Caffeine- 1 cup a day   Right handed    Social Determinants of Health   Financial Resource Strain: Not on file  Food Insecurity: Not on file  Transportation Needs: Not on file  Physical Activity: Not on file  Stress: Not on file  Social Connections: Not on file  Intimate Partner Violence: Not on file    Allergies  Allergen Reactions   Simvastatin Rash   Albuterol Other (See Comments)    Increases heartrate!!!    Augmentin [Amoxicillin-Pot Clavulanate] Rash   Ciprofloxacin Rash   Contrast Media [Iodinated Diagnostic Agents] Rash   Flagyl [Metronidazole] Rash   Latex Hives and Rash   Sulfa Antibiotics Rash    Current  Outpatient Medications  Medication Sig Dispense Refill   ALPRAZolam (XANAX) 0.25 MG tablet Take 0.25 mg by mouth 2 (two) times daily as needed for anxiety or sleep.   3   budesonide-formoterol (SYMBICORT) 160-4.5 MCG/ACT inhaler Inhale 2 puffs into the lungs 2 (two) times daily. 1 Inhaler 6   cetirizine (ZYRTEC) 5 MG tablet Take 5 mg by mouth daily.     Cyanocobalamin (VITAMIN B12 PO) Take 1 tablet by mouth daily.     D3-50 50000 units capsule Take 50,000 Units by mouth once a week. Usually Tuesday or Wednesday.  0   levalbuterol (XOPENEX) 0.63 MG/3ML nebulizer solution Take 3 mLs (0.63 mg total) by nebulization every 6 (six) hours as needed for wheezing or shortness of breath. 75 mL 6   losartan-hydrochlorothiazide (HYZAAR) 50-12.5 MG tablet Take 1 tablet by mouth  daily. Pt takes 1/2 tablet daily     metoprolol succinate (TOPROL-XL) 50 MG 24 hr tablet Take 1 tablet (50 mg total) by mouth 2 (two) times daily. 60 tablet 0   omeprazole (PRILOSEC) 40 MG capsule Take 40 mg by mouth daily.     predniSONE (DELTASONE) 10 MG tablet Take 10 mg by mouth every morning.     triamcinolone (NASACORT) 55 MCG/ACT AERO nasal inhaler Place 1 spray into the nose daily as needed (Congestion).      warfarin (COUMADIN) 2.5 MG tablet Take 2.5 mg by mouth daily.     zinc gluconate 50 MG tablet Take 50 mg by mouth daily.     neomycin-polymyxin b-dexamethasone (MAXITROL) 1.6-38466-5.9 OINT 1 application at bedtime.     No current facility-administered medications for this visit.    REVIEW OF SYSTEMS:  [X]  denotes positive finding, [ ]  denotes negative finding Cardiac  Comments:  Chest pain or chest pressure:    Shortness of breath upon exertion: x   Short of breath when lying flat:    Irregular heart rhythm: x       Vascular    Pain in calf, thigh, or hip brought on by ambulation:    Pain in feet at night that wakes you up from your sleep:     Blood clot in your veins:    Leg swelling:         Pulmonary    Oxygen at home:    Productive cough:  x   Wheezing:         Neurologic    Sudden weakness in arms or legs:     Sudden numbness in arms or legs:     Sudden onset of difficulty speaking or slurred speech:    Temporary loss of vision in one eye:     Problems with dizziness:         Gastrointestinal    Blood in stool:     Vomited blood:         Genitourinary    Burning when urinating:     Blood in urine:        Psychiatric    Major depression:         Hematologic    Bleeding problems:    Problems with blood clotting too easily:        Skin    Rashes or ulcers:        Constitutional    Fever or chills:      PHYSICAL EXAM: Vitals:   01/03/21 0855  BP: 95/61  Pulse: 75  Resp: 18  Temp: 97.7 F (36.5 C)  TempSrc: Temporal  SpO2: 94%   Weight: 175 lb 6.4 oz (79.6 kg)  Height: 6' (1.829 m)    GENERAL: The patient is a well-nourished male, in no acute distress. The vital signs are documented above. CARDIOVASCULAR: 2+ popliteal pulses bilaterally.  1-2+ right dorsalis pedis pulse.  1+ left dorsalis pedis pulse PULMONARY: There is good air exchange  MUSCULOSKELETAL: There are no major deformities or cyanosis. NEUROLOGIC: No focal weakness or paresthesias are detected. SKIN: There are no ulcers or rashes noted. PSYCHIATRIC: The patient has a normal affect.  DATA:  Noninvasive studies from 11/17/2020 were reviewed.  This shows normal ankle index bilaterally.  Normal biphasic waveforms bilaterally.  He does have some reduction in the digital waveforms.  MEDICAL ISSUES: Discussed these findings with the patient.  He does not have any evidence of severe peripheral vascular occlusive disease.  He does not have any risk for nonhealing.  He actually has gone on to heal the area of concern on his left fifth toe.  I did explain the significance of his apparent venous hypertension.  He has worn compression stockings with some relief in the past and I explained that he should resume this should he develop any worsening swelling.  He understands and will see [place] again on an as-needed basis   Rosetta Posner, MD [facility] Tel [phone] Pager [phone]  Note: Portions of this report may have been transcribed using voice recognition software.  Every effort has been made to ensure accuracy; however, inadvertent computerized transcription errors may still be present.

## 2021-12-18 ENCOUNTER — Other Ambulatory Visit: Payer: Self-pay

## 2021-12-18 ENCOUNTER — Emergency Department (HOSPITAL_COMMUNITY)
Admission: EM | Admit: 2021-12-18 | Discharge: 2021-12-18 | Disposition: A | Discharge status: Home / Self Care | Payer: Medicare Other | Attending: Emergency Medicine | Admitting: Emergency Medicine

## 2021-12-18 ENCOUNTER — Encounter (HOSPITAL_COMMUNITY): Payer: Self-pay

## 2021-12-18 DIAGNOSIS — Z91040 Latex allergy status: Secondary | ICD-10-CM | POA: Insufficient documentation

## 2021-12-18 DIAGNOSIS — U071 COVID-19: Secondary | ICD-10-CM | POA: Diagnosis not present

## 2021-12-18 DIAGNOSIS — Z20822 Contact with and (suspected) exposure to covid-19: Secondary | ICD-10-CM | POA: Diagnosis not present

## 2021-12-18 DIAGNOSIS — R509 Fever, unspecified: Secondary | ICD-10-CM | POA: Diagnosis present

## 2021-12-18 LAB — BASIC METABOLIC PANEL
Anion gap: 7 (ref 5–15)
BUN: 16 mg/dL (ref 8–23)
CO2: 30 mmol/L (ref 22–32)
Calcium: 8.8 mg/dL — ABNORMAL LOW (ref 8.9–10.3)
Chloride: 101 mmol/L (ref 98–111)
Creatinine, Ser: 0.88 mg/dL (ref 0.61–1.24)
GFR, Estimated: >60 mL/min (ref >60)
Glucose, Bld: 99 mg/dL (ref 70–99)
Potassium: 3 mmol/L — ABNORMAL LOW (ref 3.5–5.1)
Sodium: 138 mmol/L (ref 135–145)

## 2021-12-18 LAB — CBC
HCT: 43.8 % (ref 39.0–52.0)
Hemoglobin: 14.1 g/dL (ref 13.0–17.0)
MCH: 30.1 pg (ref 26.0–34.0)
MCHC: 32.2 g/dL (ref 30.0–36.0)
MCV: 93.4 fL (ref 80.0–100.0)
Platelets: 230 10*3/uL (ref 150–400)
RBC: 4.69 MIL/uL (ref 4.22–5.81)
RDW: 13.8 % (ref 11.5–15.5)
WBC: 10 10*3/uL (ref 4.0–10.5)
nRBC: 0 % (ref 0.0–0.2)

## 2021-12-18 LAB — SARS CORONAVIRUS 2 BY RT PCR: SARS Coronavirus 2 by RT PCR: POSITIVE — AB

## 2021-12-18 MED ORDER — NIRMATRELVIR/RITONAVIR (PAXLOVID)TABLET
3.0000 | ORAL_TABLET | Freq: Two times a day (BID) | ORAL | Status: DC
  Administered 2021-12-18: 3 via ORAL
  Filled 2021-12-18: qty 30

## 2021-12-18 NOTE — ED Notes (Signed)
ED Provider at bedside. 

## 2021-12-18 NOTE — ED Triage Notes (Signed)
Pt arrived via POV from home c/o fever at home of 101.120F. Pt reports being around his son a few days go who tested positive for Covid. Pt took 2 Tylenol apprx 2hrs PTA and Pts temp in Triage is 98.20F. Pt endorse body chills, nausea and body aches with a cough.

## 2021-12-18 NOTE — ED Provider Notes (Signed)
[facility] EMERGENCY DEPARTMENT Provider Note   CSN: [id number] Arrival date & time: 12/18/21  0242     History  Chief Complaint  Patient presents with   Fever    Matthew Hall is a 76 y.o. male.  Patient presents to the emergency department for evaluation of fever, chills, nausea, body aches and cough.  Symptoms began today.  Patient reports that he was around his son a couple of days ago and that his son has since tested positive for COVID.       Home Medications Prior to Admission medications   Medication Sig Start Date End Date Taking? Authorizing Provider  ALPRAZolam (XANAX) 0.25 MG tablet Take 0.25 mg by mouth 2 (two) times daily as needed for anxiety or sleep.  07/01/14   [provider]  budesonide-formoterol (SYMBICORT) 160-4.5 MCG/ACT inhaler Inhale 2 puffs into the lungs 2 (two) times daily. 12/01/14   Rigoberto Noel, MD  cetirizine (ZYRTEC) 5 MG tablet Take 5 mg by mouth daily.    [provider]  Cyanocobalamin (VITAMIN B12 PO) Take 1 tablet by mouth daily.    [provider]  D3-50 50000 units capsule Take 50,000 Units by mouth once a week. Usually Tuesday or Wednesday. 11/23/17   [provider]  levalbuterol Penne Lash) 0.63 MG/3ML nebulizer solution Take 3 mLs (0.63 mg total) by nebulization every 6 (six) hours as needed for wheezing or shortness of breath. 07/25/19   Lauraine Rinne, NP  losartan-hydrochlorothiazide (HYZAAR) 50-12.5 MG tablet Take 1 tablet by mouth daily. Pt takes 1/2 tablet daily    [provider]  metoprolol succinate (TOPROL-XL) 50 MG 24 hr tablet Take 1 tablet (50 mg total) by mouth 2 (two) times daily. 07/11/16   Thurnell Lose, MD  neomycin-polymyxin b-dexamethasone (MAXITROL) 6.8-11572-6.2 OINT 1 application at bedtime. 06/24/20   [provider]  omeprazole (PRILOSEC) 40 MG capsule Take 40 mg by mouth daily.    [provider]  predniSONE (DELTASONE) 10 MG tablet Take 10 mg by  mouth every morning. 11/03/20   [provider]  triamcinolone (NASACORT) 55 MCG/ACT AERO nasal inhaler Place 1 spray into the nose daily as needed (Congestion).     [provider]  warfarin (COUMADIN) 2.5 MG tablet Take 2.5 mg by mouth daily.    [provider]  zinc gluconate 50 MG tablet Take 50 mg by mouth daily.    [provider]      Allergies    Simvastatin, Albuterol, Augmentin [amoxicillin-pot clavulanate], Ciprofloxacin, Contrast media [iodinated contrast media], Flagyl [metronidazole], Latex, and Sulfa antibiotics    Review of Systems   Review of Systems  Physical Exam Updated Vital Signs BP 131/68 (BP Location: Left Arm)   Pulse 67   Temp 98.2 F (36.8 C) (Oral)   Resp 18   Ht 6' (1.829 m)   Wt 77.1 kg   SpO2 97%   BMI 23.06 kg/m  Physical Exam Vitals and nursing note reviewed.  Constitutional:      General: He is not in acute distress.    Appearance: He is well-developed.  HENT:     Head: Normocephalic and atraumatic.     Mouth/Throat:     Mouth: Mucous membranes are moist.  Eyes:     General: Vision grossly intact. Gaze aligned appropriately.     Extraocular Movements: Extraocular movements intact.     Conjunctiva/sclera: Conjunctivae normal.  Cardiovascular:     Rate and Rhythm: Normal rate and regular  rhythm.     Pulses: Normal pulses.     Heart sounds: Normal heart sounds, S1 normal and S2 normal. No murmur heard.    No friction rub. No gallop.  Pulmonary:     Effort: Pulmonary effort is normal. No respiratory distress.     Breath sounds: Normal breath sounds.  Abdominal:     Palpations: Abdomen is soft.     Tenderness: There is no abdominal tenderness. There is no guarding or rebound.     Hernia: No hernia is present.  Musculoskeletal:        General: No swelling.     Cervical back: Full passive range of motion without pain, normal range of motion and neck supple. No pain with movement, spinous process  tenderness or muscular tenderness. Normal range of motion.     Right lower leg: No edema.     Left lower leg: No edema.  Skin:    General: Skin is warm and dry.     Capillary Refill: Capillary refill takes less than 2 seconds.     Findings: No ecchymosis, erythema, lesion or wound.  Neurological:     Mental Status: He is alert and oriented to person, place, and time.     GCS: GCS eye subscore is 4. GCS verbal subscore is 5. GCS motor subscore is 6.     Cranial Nerves: Cranial nerves 2-12 are intact.     Sensory: Sensation is intact.     Motor: Motor function is intact. No weakness or abnormal muscle tone.     Coordination: Coordination is intact.  Psychiatric:        Mood and Affect: Mood normal.        Speech: Speech normal.        Behavior: Behavior normal.     ED Results / Procedures / Treatments   Labs (all labs ordered are listed, but only abnormal results are displayed) Labs Reviewed  SARS CORONAVIRUS 2 BY RT PCR - Abnormal; Notable for the following components:      Result Value   SARS Coronavirus 2 by RT PCR POSITIVE (*)    All other components within normal limits  CBC  BASIC METABOLIC PANEL    EKG None  Radiology No results found.  Procedures Procedures    Medications Ordered in ED Medications - No data to display  ED Course/ Medical Decision Making/ A&P                           Medical Decision Making Amount and/or Complexity of Data Reviewed Labs: ordered.   Patient presents with URI symptoms and fever.  He took Tylenol at home and is no longer experiencing fever.  He is in no distress.  He is breathing comfortably, normal oxygen saturations.  Symptoms just began.  He does have a positive COVID contact.  He is COVID test is positive.  We will initiate antivirals.        Final Clinical Impression(s) / ED Diagnoses Final diagnoses:  COVID-19    Rx / DC Orders ED Discharge Orders     None         Shylo Zamor, Gwenyth Allegra,  MD 12/18/21 [phone]

## 2022-07-21 ENCOUNTER — Ambulatory Visit: Payer: Medicare Other | Admitting: Nurse Practitioner

## 2022-07-28 ENCOUNTER — Encounter: Payer: Self-pay | Admitting: Nurse Practitioner

## 2022-07-28 ENCOUNTER — Ambulatory Visit: Payer: Medicare Other | Attending: Nurse Practitioner | Admitting: Nurse Practitioner

## 2022-07-28 VITALS — BP 132/74 | HR 56 | Ht 71.0 in | Wt 163.4 lb

## 2022-07-28 DIAGNOSIS — I4821 Permanent atrial fibrillation: Secondary | ICD-10-CM | POA: Diagnosis not present

## 2022-07-28 DIAGNOSIS — I10 Essential (primary) hypertension: Secondary | ICD-10-CM | POA: Diagnosis not present

## 2022-07-28 DIAGNOSIS — Z7901 Long term (current) use of anticoagulants: Secondary | ICD-10-CM | POA: Diagnosis not present

## 2022-07-28 DIAGNOSIS — J449 Chronic obstructive pulmonary disease, unspecified: Secondary | ICD-10-CM | POA: Diagnosis not present

## 2022-07-28 MED ORDER — METOPROLOL SUCCINATE ER 25 MG PO TB24: 25.0000 mg | ORAL_TABLET | Freq: Two times a day (BID) | ORAL | 2 refills | Status: DC

## 2022-07-28 NOTE — Progress Notes (Signed)
Cardiology Office Note:    Date:  07/28/2022   ID:  Matthew Hall, DOB 10-16-1945, MRN [id number]  PCP:  [name], [facility] Cardiologist:  Rozann Lesches, MD     Referring MD: [name], MD   CC: Here for follow-up  History of Present Illness:    Matthew Hall is a 77 y.o. male with a hx of following:  Chronic A-fib Hypertension COPD Diabetic neuropathy History of COVID-19  Patient is a very pleasant 77 year old male with past medical history as mentioned above.   Last seen by Dr. Domenic Polite on May 04, 2020.  Was overall doing well from a cardiac perspective.  He did Coumadin follow-up with his PCP.  Denied any bleeding problems on Coumadin.  Was tolerating his medications well.  Was told to follow-up in 6 months.  Today he presents for overdue follow-up.  He states he has had COVID 3 times since last office visit, preventing him from being seen in office for follow-ups regularly.  Today he states he is doing good, denies any concerning cardiac complaints.  Does admit to fatigue, stated this could be COVID-related or due to his COPD. Denies any chest pain, shortness of breath, palpitations, syncope, presyncope, dizziness, orthopnea, PND, swelling or significant weight changes, acute bleeding, or claudication.  SBP at home ranges from 115-140.  Caregiver for his wife who has rheumatoid arthritis.  Denies any other questions or concerns today.  Past Medical History:  Diagnosis Date   Anxiety    Aortic atherosclerosis (HCC)    Atrial fibrillation (HCC)    Basal cell carcinoma    Bladder cancer (HCC)    Bladder tumor    Colon polyps    COPD (chronic obstructive pulmonary disease) (HCC)    Cutaneous vasculitis    Diabetic peripheral neuropathy ([place]) 12/27/2020   Diverticulosis    Essential hypertension    GERD (gastroesophageal reflux disease)    History of pneumonia    Nephrolithiasis    Osteoarthritis     Past  Surgical History:  Procedure Laterality Date   COLONOSCOPY     Kidney stones removed  [place] Left    2003   TRANSURETHRAL RESECTION OF BLADDER TUMOR  1996    Current Medications: Current Meds  Medication Sig   ALPRAZolam (XANAX) 0.25 MG tablet Take 0.25 mg by mouth 2 (two) times daily as needed for anxiety or sleep.    budesonide-formoterol (SYMBICORT) 160-4.5 MCG/ACT inhaler Inhale 2 puffs into the lungs 2 (two) times daily.   cetirizine (ZYRTEC) 5 MG tablet Take 5 mg by mouth daily.   Cyanocobalamin (VITAMIN B12 PO) Take 1 tablet by mouth daily.   D3-50 50000 units capsule Take 50,000 Units by mouth once a week. Usually Tuesday or Wednesday.   levalbuterol (XOPENEX) 0.63 MG/3ML nebulizer solution Take 3 mLs (0.63 mg total) by nebulization every 6 (six) hours as needed for wheezing or shortness of breath.   losartan-hydrochlorothiazide (HYZAAR) 50-12.5 MG tablet Take 1 tablet by mouth daily. Pt takes 1/2 tablet daily   metoprolol succinate (TOPROL XL) 25 MG 24 hr tablet Take 1 tablet (25 mg total) by mouth 2 (two) times daily.   neomycin-polymyxin b-dexamethasone (MAXITROL) 8.0-99833-8.2 OINT 1 application at bedtime.   omeprazole (PRILOSEC) 40 MG capsule Take 40 mg by mouth daily.   predniSONE (DELTASONE) 10 MG tablet Take 10 mg by mouth every morning.   triamcinolone (NASACORT) 55 MCG/ACT AERO nasal inhaler Place 1  spray into the nose daily as needed (Congestion).    warfarin (COUMADIN) 2.5 MG tablet Take 2.5 mg by mouth daily.   zinc gluconate 50 MG tablet Take 50 mg by mouth daily.   [DISCONTINUED] metoprolol succinate (TOPROL-XL) 50 MG 24 hr tablet Take 1 tablet (50 mg total) by mouth 2 (two) times daily.     Allergies:   Simvastatin, Albuterol, Augmentin [amoxicillin-pot clavulanate], Ciprofloxacin, Contrast media [iodinated contrast media], Flagyl [metronidazole], Latex, and Sulfa antibiotics   Social History   Socioeconomic History   Marital status:  Married    Spouse name: Not on file   Number of children: 3   Years of education: Not on file   Highest education level: Not on file  Occupational History   Occupation: Pipefilter/retired    Comment: [employer]  Tobacco Use   Smoking status: Former    Packs/day: 1.00    Years: 30.00    Total pack years: 30.00    Types: Cigarettes    Quit date: 07/07/2011    Years since quitting: 11.0   Smokeless tobacco: Never  Vaping Use   Vaping Use: Never used  Substance and Sexual Activity   Alcohol use: No    Alcohol/week: 0.0 standard drinks of alcohol   Drug use: No   Sexual activity: Yes  Other Topics Concern   Not on file  Social History Narrative   Lives with wife   Caffeine- 1 cup a day   Right handed    Social Determinants of Health   Financial Resource Strain: Not on file  Food Insecurity: Not on file  Transportation Needs: Not on file  Physical Activity: Not on file  Stress: Not on file  Social Connections: Not on file     Family History: The patient's family history includes CAD in his mother; Diabetes in his mother and sister; Emphysema in his father; Liver cancer in his brother; Lung cancer in his brother; Stroke in his sister. There is no history of Colon cancer.  ROS:   Review of Systems  Constitutional:  Positive for malaise/fatigue. Negative for chills, diaphoresis, fever and weight loss.  HENT: Negative.         Right lower lid redness  Eyes: Negative.   Respiratory: Negative.    Cardiovascular: Negative.   Gastrointestinal: Negative.   Genitourinary: Negative.   Musculoskeletal: Negative.   Skin: Negative.   Neurological: Negative.   Endo/Heme/Allergies: Negative.   Psychiatric/Behavioral: Negative.      Please see the history of present illness.    All other systems reviewed and are negative.  EKGs/Labs/Other Studies Reviewed:    The following studies were reviewed today:   EKG:  EKG is ordered today.  The ekg ordered today demonstrates A-fib,  56 bpm, nonspecific intraventricular delay, nonspecific ST segment changes.   ABIs on Nov 17, 2020: IMPRESSION: Normal ankle and toe brachial indices. No evidence significant flow limiting stenosis in the bilateral lower extremity arteries. Severely dampened left and absent right digital vascular waveforms as could be seen with small vessel arterial disease.   ABIs on February 26, 2017: Normal study.   Echocardiogram on July 09, 2020: Left ventricle:  The cavity size was normal. Wall thickness was  increased in a pattern of mild LVH. Systolic function was vigorous.  The estimated ejection fraction was in the range of 65% to 70%.  Wall motion was normal; there were no regional wall motion  abnormalities. The study is not technically sufficient to allow  evaluation of LV  diastolic function.   -------------------------------------------------------------------  Aortic valve:   Trileaflet; normal thickness leaflets. Mobility was  not restricted.  Doppler:  Transvalvular velocity was within the  normal range. There was no stenosis. There was no regurgitation.    -------------------------------------------------------------------  Aorta: Aortic root: The aortic root was normal in size.   -------------------------------------------------------------------  Mitral valve:   Calcified annulus. Mobility was not restricted.  Doppler:  Transvalvular velocity was within the normal range. There  was no evidence for stenosis. There was no regurgitation.   -------------------------------------------------------------------  Left atrium:  The atrium was moderately dilated.   -------------------------------------------------------------------  Right ventricle:  The cavity size was normal. Wall thickness was  normal. Systolic function was normal.   -------------------------------------------------------------------  Pulmonic valve:   Poorly visualized.  Structurally normal valve.  Cusp  separation was normal.  Doppler:  Transvalvular velocity was  within the normal range. There was no evidence for stenosis. There  was no regurgitation.   -------------------------------------------------------------------  Tricuspid valve:   Structurally normal valve.    Doppler:  Transvalvular velocity was within the normal range. There was mild  regurgitation.   -------------------------------------------------------------------  Pulmonary artery:   The main pulmonary artery was normal-sized.  Systolic pressure was within the normal range.   -------------------------------------------------------------------  Right atrium:  The atrium was moderately dilated.   -------------------------------------------------------------------  Pericardium: There was no pericardial effusion.   -------------------------------------------------------------------  Systemic veins:  Inferior vena cava: The vessel was normal in size.  Recent Labs: 12/18/2021: BUN 16; Creatinine, Ser 0.88; Hemoglobin 14.1; Platelets 230; Potassium 3.0; Sodium 138  Recent Lipid Panel    Component Value Date/Time   TRIG 75 10/29/2020 1635     Risk Assessment/Calculations:    CHA2DS2-VASc Score = 4  This indicates a 4.8% annual risk of stroke. The patient's score is based upon: CHF History: 0 HTN History: 1 Diabetes History: 1 Stroke History: 0 Vascular Disease History: 0 Age Score: 2 Gender Score: 0   Physical Exam:    VS:  BP 132/74   Pulse (!) 56   Ht '5\' 11"'$  (1.803 m)   Wt 163 lb 6.4 oz (74.1 kg)   SpO2 94%   BMI 22.79 kg/m     Wt Readings from Last 3 Encounters:  07/28/22 163 lb 6.4 oz (74.1 kg)  12/18/21 170 lb (77.1 kg)  01/03/21 175 lb 6.4 oz (79.6 kg)     GEN: Well nourished, well developed in no acute distress HEENT: Right lower lid redness, otherwise normal NECK: No JVD; No carotid bruits CARDIAC: S1/S2, slow rate and regular rhythm, no murmurs, rubs, gallops; plus  pulses RESPIRATORY:  Clear to auscultation without rales, wheezing or rhonchi  MUSCULOSKELETAL:  No edema; No deformity  SKIN: Cool and dry NEUROLOGIC:  Alert and oriented x 3 PSYCHIATRIC:  Normal affect   ASSESSMENT:    1. Permanent atrial fibrillation ([place])   2. Current use of long term anticoagulation   3. Hypertension, unspecified type   4. Chronic obstructive pulmonary disease, unspecified COPD type ([place])    PLAN:    In order of problems listed above:  Permanent A-fib, chronic anticoagulation EKG today reveals A-fib, 56 bpm, nonspecific intraventricular block, nonspecific ST segment changes, otherwise no acute ischemic changes.  CHA2DS2-VASc score today is 4.  Denies any palpitations, does note some fatigue.  Will reduce Toprol-XL to 25 mg daily.  Continue Coumadin for stroke prophylaxis, denies any bleeding issues. Continue to follow with PCP. Heart healthy diet and regular cardiovascular exercise encouraged.  Hypertension BP stable today.  BP overall well-controlled at home per his report.  I discussed that by reducing metoprolol may increase blood pressure.  He would like to monitor for now. Discussed to monitor BP at home at least 2 hours after medications and sitting for 5-10 minutes. Given BP log today. Discussed to contact office if SBP is consistently greater than 130.  If SBP is not well-controlled by next follow-up visit, recommend initiating amlodipine. Heart healthy diet and regular cardiovascular exercise encouraged.   COPD Denies any acute exacerbations or worsening shortness of breath.  Continue follow-up with pulmonology.  4.  Disposition: Follow-up with Dr. Domenic Polite in 6 months or sooner if anything changes.   Medication Adjustments/Labs and Tests Ordered: Current medicines are reviewed at length with the patient today.  Concerns regarding medicines are outlined above.  Orders Placed This Encounter  Procedures   EKG 12-Lead   Meds ordered this encounter   Medications   metoprolol succinate (TOPROL XL) 25 MG 24 hr tablet    Sig: Take 1 tablet (25 mg total) by mouth 2 (two) times daily.    Dispense:  180 tablet    Refill:  2    07/28/2022 dose decrease    Patient Instructions  Medication Instructions:  Your physician has recommended you make the following change in your medication:  Decrease metoprolol succinate to 25 mg twice daily. (You may break your 50 mg tablet in 1/2 twice daily until they are finished). Continue other medications the same  Labwork: none  Testing/Procedures: none  Follow-Up: Your physician recommends that you schedule a follow-up appointment in: 6 months with Dr. Domenic Polite  Any Other Special Instructions Will Be Listed Below (If Applicable). Your physician has requested that you regularly monitor and record your blood pressure readings at home. Please use the same machine at the same time of day to check your readings and record them. See log sheet with instructions [place] information provided below   If you need a refill on your cardiac medications before your next appointment, please call your pharmacy.   SignedFinis Bud, NP  07/28/2022 10:48 AM    [place]

## 2022-07-28 NOTE — Patient Instructions (Addendum)
Medication Instructions:  Your physician has recommended you make the following change in your medication:  Decrease metoprolol succinate to 25 mg twice daily. (You may break your 50 mg tablet in 1/2 twice daily until they are finished). Continue other medications the same  Labwork: none  Testing/Procedures: none  Follow-Up: Your physician recommends that you schedule a follow-up appointment in: 6 months with Dr. Domenic Polite  Any Other Special Instructions Will Be Listed Below (If Applicable). Your physician has requested that you regularly monitor and record your blood pressure readings at home. Please use the same machine at the same time of day to check your readings and record them. See log sheet with instructions [place] information provided below   If you need a refill on your cardiac medications before your next appointment, please call your pharmacy.

## 2022-08-03 ENCOUNTER — Encounter: Payer: Self-pay | Admitting: Pulmonary Disease

## 2022-08-03 ENCOUNTER — Ambulatory Visit: Payer: Medicare Other | Admitting: Pulmonary Disease

## 2022-08-03 VITALS — BP 128/68 | HR 66 | Temp 98.6°F | Ht 72.0 in | Wt 160.0 lb

## 2022-08-03 DIAGNOSIS — J449 Chronic obstructive pulmonary disease, unspecified: Secondary | ICD-10-CM

## 2022-08-03 DIAGNOSIS — J438 Other emphysema: Secondary | ICD-10-CM

## 2022-08-03 DIAGNOSIS — J479 Bronchiectasis, uncomplicated: Secondary | ICD-10-CM

## 2022-08-03 NOTE — Progress Notes (Signed)
Subjective:    Patient ID: Matthew Hall, male    DOB: 1945-10-28, 77 y.o.   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  PMH - AF  Chief Complaint  Patient presents with   Consult    Was seen with [facility] in 2021 for COPD.  Wants to re establish care     He was last seen by me in 2019 and by our office in 2021 and has been lost to follow-up since.  He now presents to reestablish care.  He had his third COVID infection 12/2021 and this was fortunately not severe. He was maintained on Spiriva in the past but I see that this has fallen off his medication list.  He admits to not taking Symbicort which is on his list.  Albuterol caused increased heart rate in Xopenex nebs seems to work for him he uses this only as needed about once every couple of weeks.  He pays for this out-of-pocket since his insurance does not cover this. His main complaint seems to be sinus congestion and nasal drip for which she uses Nasacort. Cardiologist recently decreased his metoprolol from 50 to 25 mg twice daily and this really helped his breathing. Chest x-ray 10/2020 shows chronic interstitial prominence suggestive of ILD and left midlung scarring. CT abdomen from 2019 shows right lower lobe bronchiectasis  Significant tests/ events reviewed  CT chest 2009 showed >> RLL & lingular bronchectasis & areas of pleuro-parenchymal scarring. 8 mm low density lesion in liver >> stable on rpt CT 01/2008. Incidentally, not seen on CT in 1995.   FEV1 trend '95 59% >> 64% in 09/2007 >> 59% in 03/2008     09/2014  FEv1 40%, ratio 62 07/2015 FEV1 40%     Past Medical History:  Diagnosis Date   Anxiety    Aortic atherosclerosis (HCC)    Atrial fibrillation  (HCC)    Basal cell carcinoma    Bladder cancer (HCC)    Bladder tumor    Colon polyps    COPD (chronic obstructive pulmonary disease) (HCC)    Cutaneous vasculitis    Diabetic peripheral neuropathy ([place]) 12/27/2020   Diverticulosis    Essential hypertension    GERD (gastroesophageal reflux disease)    History of pneumonia    Nephrolithiasis    Osteoarthritis    Past Surgical History:  Procedure Laterality Date   COLONOSCOPY     Kidney stones removed  [place] Left    2003   TRANSURETHRAL RESECTION OF BLADDER TUMOR  1996    Allergies  Allergen Reactions   Simvastatin Rash   Albuterol Other (See Comments)    Increases heartrate!!!    Augmentin [Amoxicillin-Pot Clavulanate] Rash   Ciprofloxacin Rash   Contrast Media [Iodinated Contrast Media] Rash   Flagyl [Metronidazole] Rash   Latex Hives and Rash   Sulfa Antibiotics Rash    Social History   Socioeconomic History   Marital status: Married    Spouse name: Not on file   Number of children: 3   Years of education: Not on file   Highest education level: Not on file  Occupational History   Occupation: Pipefilter/retired    Comment: Psychologist, prison and probation services  Tobacco Use   Smoking status: Former    Packs/day: 1.00    Years: 30.00    Total pack years: 30.00    Types: Cigarettes    Quit date: 07/07/2011    Years since quitting: 11.0   Smokeless tobacco: Never  Vaping Use   Vaping Use: Never used  Substance and Sexual Activity   Alcohol use: No    Alcohol/week: 0.0 standard drinks of alcohol   Drug use: No   Sexual activity: Yes  Other Topics Concern   Not on file  Social History Narrative   Lives with wife   Caffeine- 1 cup a day   Right handed    Social Determinants of Health   Financial Resource Strain: Not on file  Food Insecurity: Not on file  Transportation Needs: Not on file  Physical Activity: Not on file  Stress: Not on file  Social Connections: Not on file  Intimate Partner Violence:  Not on file    Family History  Problem Relation Age of Onset   Emphysema Father    Lung cancer Brother    Liver cancer Brother    Diabetes Mother    CAD Mother    Diabetes Sister    Stroke Sister    Colon cancer Neg Hx     Review of Systems Shortness of breath with activity Dry cough Irregular heartbeat Loss of appetite Tooth problems  Constitutional: negative for anorexia, fevers and sweats  Eyes: negative for irritation, redness and visual disturbance  Ears, nose, mouth, throat, and face: negative for earaches, epistaxis, nasal congestion and sore throat  Respiratory: negative for sputum and wheezing  Cardiovascular: negative for chest pain, lower extremity edema, orthopnea, palpitations and syncope  Gastrointestinal: negative for abdominal pain, constipation, diarrhea, melena, nausea and vomiting  Genitourinary:negative for dysuria, frequency and hematuria  Hematologic/lymphatic: negative for bleeding, easy bruising and lymphadenopathy  Musculoskeletal:negative for arthralgias, muscle weakness and stiff joints  Neurological: negative for coordination problems, gait problems, headaches and weakness  Endocrine: negative for diabetic symptoms including polydipsia, polyuria and weight loss     Objective:   Physical Exam  Gen. Pleasant, well-nourished, in no distress, normal affect ENT - no pallor,icterus, no post nasal drip Neck: No JVD, no thyromegaly, no carotid bruits Lungs: no use of accessory muscles, no dullness to percussion, RLL dry rales Cardiovascular: Rhythm regular, heart sounds  normal, no murmurs or gallops, no peripheral edema Abdomen: soft and non-tender, no hepatosplenomegaly, BS normal. Musculoskeletal: No deformities, no cyanosis or clubbing Neuro:  alert, non focal       Assessment & Plan:

## 2022-08-03 NOTE — Assessment & Plan Note (Signed)
He has crackles right lower lobe which is likely related to previously noted bronchiectasis.  We will repeat high-resolution CT chest and see if this is worsening over the past few years

## 2022-08-03 NOTE — Assessment & Plan Note (Signed)
Previous PFTs have shown significant airway obstruction.  He is not really compliant with Symbicort and Spiriva has fallen off his list. Will repeat PFTs and if he is willing, place him on triple therapy He will continue to use Xopenex nebs as needed for rescue

## 2022-08-03 NOTE — Patient Instructions (Signed)
  X schedule PFTs  X HRCT chest for bronchiectasis

## 2022-09-18 ENCOUNTER — Ambulatory Visit (HOSPITAL_COMMUNITY)
Admission: RE | Admit: 2022-09-18 | Discharge: 2022-09-18 | Disposition: A | Discharge status: Home / Self Care | Payer: Medicare Other | Source: Ambulatory Visit | Attending: Pulmonary Disease | Admitting: Pulmonary Disease

## 2022-09-18 DIAGNOSIS — J479 Bronchiectasis, uncomplicated: Secondary | ICD-10-CM | POA: Insufficient documentation

## 2022-10-10 ENCOUNTER — Ambulatory Visit (HOSPITAL_COMMUNITY)
Admission: RE | Admit: 2022-10-10 | Discharge: 2022-10-10 | Disposition: A | Discharge status: Home / Self Care | Payer: Medicare Other | Source: Ambulatory Visit | Attending: Pulmonary Disease | Admitting: Pulmonary Disease

## 2022-10-10 DIAGNOSIS — J438 Other emphysema: Secondary | ICD-10-CM | POA: Diagnosis present

## 2022-10-10 LAB — PULMONARY FUNCTION TEST
DL/VA % pred: 98 %
DL/VA: 3.8 ml/min/mmHg/L
DLCO unc % pred: 50 %
DLCO unc: 12.64 ml/min/mmHg
FEF 25-75 Post: 0.4 L/sec
FEF 25-75 Pre: 0.35 L/sec
FEF2575-%Change-Post: 15 %
FEF2575-%Pred-Post: 18 %
FEF2575-%Pred-Pre: 16 %
FEV1-%Change-Post: 4 %
FEV1-%Pred-Post: 35 %
FEV1-%Pred-Pre: 33 %
FEV1-Post: 1.09 L
FEV1-Pre: 1.05 L
FEV1FVC-%Change-Post: 3 %
FEV1FVC-%Pred-Pre: 65 %
FEV6-%Change-Post: 4 %
FEV6-%Pred-Post: 50 %
FEV6-%Pred-Pre: 48 %
FEV6-Post: 2.03 L
FEV6-Pre: 1.94 L
FEV6FVC-%Change-Post: 3 %
FEV6FVC-%Pred-Post: 97 %
FEV6FVC-%Pred-Pre: 93 %
FVC-%Change-Post: 1 %
FVC-%Pred-Post: 52 %
FVC-%Pred-Pre: 51 %
FVC-Post: 2.23 L
FVC-Pre: 2.2 L
Post FEV1/FVC ratio: 49 %
Post FEV6/FVC ratio: 91 %
Pre FEV1/FVC ratio: 47 %
Pre FEV6/FVC Ratio: 88 %
RV % pred: 178 %
RV: 4.77 L
TLC % pred: 95 %
TLC: 6.93 L

## 2022-10-10 MED ORDER — LEVALBUTEROL HCL 0.63 MG/3ML IN NEBU
0.6300 mg | INHALATION_SOLUTION | Freq: Once | RESPIRATORY_TRACT | Status: AC
  Administered 2022-10-10: 0.63 mg via RESPIRATORY_TRACT
  Filled 2022-10-10: qty 3

## 2022-11-03 ENCOUNTER — Telehealth: Payer: Self-pay | Admitting: Pulmonary Disease

## 2022-11-03 NOTE — Telephone Encounter (Signed)
Pls call PT with PFT and CT results. His # is [phone]

## 2022-11-10 NOTE — Telephone Encounter (Signed)
Called and spoke w/ pt he verbalized understanding - scheduled pt for 5/17 @ 4pm

## 2022-11-24 ENCOUNTER — Encounter: Payer: Self-pay | Admitting: Pulmonary Disease

## 2022-11-24 ENCOUNTER — Ambulatory Visit: Payer: Medicare Other | Admitting: Pulmonary Disease

## 2022-11-24 VITALS — BP 106/48 | HR 63 | Ht 71.0 in | Wt 157.0 lb

## 2022-11-24 DIAGNOSIS — J479 Bronchiectasis, uncomplicated: Secondary | ICD-10-CM

## 2022-11-24 DIAGNOSIS — I739 Peripheral vascular disease, unspecified: Secondary | ICD-10-CM

## 2022-11-24 DIAGNOSIS — J449 Chronic obstructive pulmonary disease, unspecified: Secondary | ICD-10-CM

## 2022-11-24 DIAGNOSIS — E1142 Type 2 diabetes mellitus with diabetic polyneuropathy: Secondary | ICD-10-CM | POA: Diagnosis not present

## 2022-11-24 NOTE — Progress Notes (Signed)
   Subjective:    Patient ID: Matthew Hall, male    DOB: 1945/08/11, 77 y.o.   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   PMH - AF Albuterol caused increased heart rate, Xopenex nebs works better  Chief Complaint  Patient presents with   Follow-up    Pt f/u states that his breathing is fine the only compliant he has is his back hurting. Ongoing 3 weeks   10-month follow-up visit He reports worsening breathing when pollen was high.  He took Xopenex nebs.  His PCP gave him prednisone and that improved his breathing.  He reports some mucus production. Currently his breathing is "fine".  He uses Xopenex about once or twice a week. He states that his breathing test has always been low even when he was working.  He would always failed that test. He was on Symbicort and Spiriva in the past but did not feel like they helped him much  He has a constant nasal drip and he wonders if this is the main problem   Significant tests/ events reviewed   HRCT chest 09/2022 >> Extensive peribronchovascular nodularity and nodular consolidation, bronchiectasis and mucoid impaction bilaterally.  CT abdomen from 2019 shows right lower lobe bronchiectasis  CT chest 2009 showed >> RLL & lingular bronchectasis & areas of pleuro-parenchymal scarring. 8 mm low density lesion in liver >> stable on rpt CT 01/2008. Incidentally, not seen on CT in 1995.   FEV1 trend '95 59% >> 64% in 09/2007 >> 59% in 03/2008     09/2014  FEv1 40%, ratio 62 07/2015 FEV1 40%  PFTs 10/2022 severe airway obstruction, ratio 47, FEV1 33%, TLC normal, DLCO 50%  Anti-trypsin nml  Review of Systems neg for any significant sore throat, dysphagia, itching, sneezing, nasal congestion or excess/  purulent secretions, fever, chills, sweats, unintended wt loss, pleuritic or exertional cp, hempoptysis, orthopnea pnd or change in chronic leg swelling. Also denies presyncope, palpitations, heartburn, abdominal pain, nausea, vomiting, diarrhea or change in bowel or urinary habits, dysuria,hematuria, rash, arthralgias, visual complaints, headache, numbness weakness or ataxia.     Objective:   Physical Exam  Gen. Pleasant, well-nourished, in no distress ENT - no thrush, no pallor/icterus,no post nasal drip Neck: No JVD, no thyromegaly, no carotid bruits Lungs: no use of accessory muscles, no dullness to percussion, bibasal fine dry crackles Cardiovascular: Rhythm regular, heart sounds  normal, no murmurs or gallops, no peripheral edema Musculoskeletal: No deformities, no cyanosis or clubbing        Assessment & Plan:

## 2022-11-24 NOTE — Assessment & Plan Note (Signed)
He felt Symbicort and Spiriva did not help in the past.  Will give him a sample of Trelegy.  He is resistant to using any kind of maintenance medication but was agreeable to try this.  He will call back for prescription if this works We discussed action plan for COPD and signs and symptoms of COPD exacerbation

## 2022-11-24 NOTE — Assessment & Plan Note (Addendum)
Dates back to 2009, mild worsening. Will check sputum for AFB/for mycobacterial infection as a cause. Serology was negative in the past and immunoglobulin levels were normal

## 2022-11-24 NOTE — Patient Instructions (Signed)
Your lung function has dropped to 33%.  x sample of Trelegy 100 -take once daily, rinse your mouth after use. Call us for prescription if this works.  x check sputum for AFB We will test for mycobacterial infection as a cause of bronchiectasis

## 2022-11-29 ENCOUNTER — Other Ambulatory Visit: Payer: Self-pay | Admitting: Pulmonary Disease

## 2022-11-29 ENCOUNTER — Other Ambulatory Visit: Payer: Self-pay

## 2022-11-29 DIAGNOSIS — J479 Bronchiectasis, uncomplicated: Secondary | ICD-10-CM

## 2022-11-29 NOTE — Progress Notes (Signed)
Opened encounter to place order for sputum culture

## 2022-12-04 LAB — SPUTUM CULTURE

## 2022-12-04 LAB — GRAM STAIN W/SPUTUM CULT RFLX

## 2022-12-16 ENCOUNTER — Encounter: Payer: Self-pay | Admitting: Pulmonary Disease

## 2022-12-21 ENCOUNTER — Telehealth: Payer: Self-pay | Admitting: Pulmonary Disease

## 2022-12-21 NOTE — Telephone Encounter (Signed)
PT calling about some test (ultra sound at [facility]?) that was supposed to be ordered for his liver (some sort of lesion) . I see nothing. Adv him we were a Pulm but he said we had ordered it. Pls call to advise. [phone]  He also has questions on his Sputum test results

## 2022-12-21 NOTE — Telephone Encounter (Signed)
Called and spoke w/ pt he states that he was under the impression that Dr.Alva was wanting him to have an [place] of his liver. I do not see any documentation regarding that. RA please advise? Pt did seem slightly confused.

## 2023-01-04 NOTE — Telephone Encounter (Signed)
Please call pt to advise based on Dr. Reginia Naas reply below. TY

## 2023-01-05 NOTE — Telephone Encounter (Signed)
ATC pt LVM for him to call office back.  

## 2023-01-08 NOTE — Telephone Encounter (Signed)
Pt returning call; I spoke w/ him regarding RA message. He verbalized understanding. NFN att.

## 2023-01-29 ENCOUNTER — Ambulatory Visit: Payer: Medicare Other | Admitting: Cardiology

## 2023-03-05 ENCOUNTER — Other Ambulatory Visit: Payer: Self-pay

## 2023-03-05 ENCOUNTER — Emergency Department (HOSPITAL_COMMUNITY)
Admission: EM | Admit: 2023-03-05 | Discharge: 2023-03-05 | Disposition: A | Discharge status: Home / Self Care | Payer: Medicare Other | Attending: Student | Admitting: Student

## 2023-03-05 ENCOUNTER — Encounter (HOSPITAL_COMMUNITY): Payer: Self-pay | Admitting: Emergency Medicine

## 2023-03-05 DIAGNOSIS — R21 Rash and other nonspecific skin eruption: Secondary | ICD-10-CM | POA: Insufficient documentation

## 2023-03-05 DIAGNOSIS — Z91040 Latex allergy status: Secondary | ICD-10-CM | POA: Diagnosis not present

## 2023-03-05 DIAGNOSIS — Z7901 Long term (current) use of anticoagulants: Secondary | ICD-10-CM | POA: Insufficient documentation

## 2023-03-05 DIAGNOSIS — Z79899 Other long term (current) drug therapy: Secondary | ICD-10-CM | POA: Insufficient documentation

## 2023-03-05 DIAGNOSIS — Z87891 Personal history of nicotine dependence: Secondary | ICD-10-CM | POA: Diagnosis not present

## 2023-03-05 DIAGNOSIS — E1140 Type 2 diabetes mellitus with diabetic neuropathy, unspecified: Secondary | ICD-10-CM | POA: Insufficient documentation

## 2023-03-05 DIAGNOSIS — Z8551 Personal history of malignant neoplasm of bladder: Secondary | ICD-10-CM | POA: Diagnosis not present

## 2023-03-05 DIAGNOSIS — J449 Chronic obstructive pulmonary disease, unspecified: Secondary | ICD-10-CM | POA: Insufficient documentation

## 2023-03-05 DIAGNOSIS — I10 Essential (primary) hypertension: Secondary | ICD-10-CM | POA: Insufficient documentation

## 2023-03-05 DIAGNOSIS — I776 Arteritis, unspecified: Secondary | ICD-10-CM | POA: Insufficient documentation

## 2023-03-05 LAB — CBC
HCT: 46 % (ref 39.0–52.0)
Hemoglobin: 14.4 g/dL (ref 13.0–17.0)
MCH: 30 pg (ref 26.0–34.0)
MCHC: 31.3 g/dL (ref 30.0–36.0)
MCV: 95.8 fL (ref 80.0–100.0)
Platelets: 319 10*3/uL (ref 150–400)
RBC: 4.8 MIL/uL (ref 4.22–5.81)
RDW: 13.2 % (ref 11.5–15.5)
WBC: 10.9 10*3/uL — ABNORMAL HIGH (ref 4.0–10.5)
nRBC: 0 % (ref 0.0–0.2)

## 2023-03-05 LAB — BASIC METABOLIC PANEL
Anion gap: 10 (ref 5–15)
BUN: 26 mg/dL — ABNORMAL HIGH (ref 8–23)
CO2: 27 mmol/L (ref 22–32)
Calcium: 8.8 mg/dL — ABNORMAL LOW (ref 8.9–10.3)
Chloride: 101 mmol/L (ref 98–111)
Creatinine, Ser: 1.08 mg/dL (ref 0.61–1.24)
GFR, Estimated: >60 mL/min (ref >60)
Glucose, Bld: 159 mg/dL — ABNORMAL HIGH (ref 70–99)
Potassium: 3.3 mmol/L — ABNORMAL LOW (ref 3.5–5.1)
Sodium: 138 mmol/L (ref 135–145)

## 2023-03-05 LAB — C-REACTIVE PROTEIN: CRP: 2.9 mg/dL — ABNORMAL HIGH (ref <1.0)

## 2023-03-05 LAB — SEDIMENTATION RATE: Sed Rate: 23 mm/hr — ABNORMAL HIGH (ref 0–16)

## 2023-03-05 MED ORDER — METHYLPREDNISOLONE 4 MG PO TBPK: ORAL_TABLET | ORAL | 0 refills | Status: DC

## 2023-03-05 MED ORDER — DOXYCYCLINE HYCLATE 100 MG PO CAPS: 100.0000 mg | ORAL_CAPSULE | Freq: Two times a day (BID) | ORAL | 0 refills | Status: DC

## 2023-03-05 NOTE — ED Triage Notes (Signed)
Pt endorses rash since Saturday evening on left foot. Burns and stings. Pt has neuropathy and reports normal numbness to foot. Pt started taking doxycyline yesterday that he had leftover. Hx of vasculitis.

## 2023-03-05 NOTE — ED Provider Triage Note (Signed)
Emergency Medicine Provider Triage Evaluation Note  Matthew Hall , a 77 y.o. male  was evaluated in triage.  Pt complains of rash.   Review of Systems  Positive: *** Negative: ***  Physical Exam  BP 136/79 (BP Location: Right Arm)   Pulse (!) 59   Temp 98.7 F (37.1 C) (Oral)   Resp 16   Ht 5\' 11"  (1.803 m)   Wt 71.2 kg   SpO2 92%   BMI 21.89 kg/m  Gen:   Awake, no distress  *** Resp:  Normal effort *** MSK:   Moves extremities without difficulty *** Other:  ***  Medical Decision Making  Medically screening exam initiated at 3:49 PM.  Appropriate orders placed.  Matthew Hall was informed that the remainder of the evaluation will be completed by another provider, this initial triage assessment does not replace that evaluation, and the importance of remaining in the ED until their evaluation is complete.  ***

## 2023-03-06 LAB — LYME DISEASE SEROLOGY W/REFLEX: Lyme Total Antibody EIA: NEGATIVE

## 2023-03-06 NOTE — ED Provider Notes (Signed)
[place] EMERGENCY DEPARTMENT AT [facility] Provider Note  CSN: [id number] Arrival date & time: 03/05/23 1403  Chief Complaint(s) Rash  HPI MOSHE TURCHETTA is a 77 y.o. male with PMH COPD, A-fib, bladder cancer, diabetic neuropathy, vasculitis who presents emergency room for evaluation of right lower extremity rash.  Patient states that he has been dealing with this rash for the last 2 months and is worsened over the last 2 to 3 days.  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  He states that he previously required doxycycline and steroids for a rash like this in the past and it did seem to help.  He follows with a dermatologist but they did not evaluate him for his ankle and instead are primarily involved with his Mohs surgery on his face.  He states rash is limited to the left lower extremity over the dorsum of the foot and into the ankle.  He states previously this rash was all over his body.  Denies chest pain, shortness of breath, Donnell pain, nausea, vomiting or other systemic symptoms.   Past Medical History Past Medical History:  Diagnosis Date   Anxiety    Aortic atherosclerosis (HCC)    Atrial fibrillation (HCC)    Basal cell carcinoma    Bladder cancer (HCC)    Bladder tumor    Colon polyps    COPD (chronic obstructive pulmonary disease) (HCC)    Cutaneous vasculitis    Diabetic peripheral neuropathy (HCC) 12/27/2020   Diverticulosis    Essential hypertension    GERD (gastroesophageal reflux disease)    History of pneumonia    Nephrolithiasis    Osteoarthritis    Patient Active Problem List   Diagnosis Date Noted   Diabetic peripheral neuropathy (HCC) 12/27/2020   Paresthesia 12/01/2020   Pneumonia due to COVID-19 virus 10/29/2020   Hypokalemia 10/29/2020   Healthcare maintenance 07/25/2019   Leucocytosis 08/11/2017   Elevated sed rate 05/21/2017   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) 03/01/2017    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 Authorizing Provider  doxycycline (VIBRAMYCIN) 100 MG capsule Take 1 capsule (100 mg total) by mouth 2 (two) times daily. 03/05/23  Yes Yadiel Aubry, MD  methylPREDNISolone (MEDROL DOSEPAK) 4 MG TBPK tablet Take as prescribed 03/05/23  Yes Najee Cowens, MD  ALPRAZolam (XANAX) 0.25 MG tablet Take 0.25 mg by mouth 2 (two) times daily as needed for anxiety or sleep.  07/01/14   [provider]  cetirizine (ZYRTEC) 5 MG tablet Take 5 mg by mouth daily.    [provider]  Cyanocobalamin (VITAMIN B12 PO) Take 1 tablet by mouth daily.    [provider]  D3-50 50000 units capsule Take 50,000 Units by mouth once a week. Usually Tuesday or Wednesday. 11/23/17   [provider]  doxycycline (ADOXA) 100 MG tablet Take 100 mg by mouth 2 (two) times daily.    [provider]  levalbuterol Pauline Aus) 0.63 MG/3ML nebulizer solution Take 3 mLs (0.63 mg total) by nebulization every 6 (six) hours as needed for wheezing or shortness of breath. 07/25/19   Coral Ceo, NP  losartan-hydrochlorothiazide (HYZAAR) 50-12.5  MG tablet Take 1 tablet by mouth daily. Pt takes 1/2 tablet daily    [provider]  metoprolol succinate (TOPROL XL) 25 MG 24 hr tablet Take 1 tablet (25 mg total) by mouth 2 (two) times daily. 07/28/22   Sharlene Dory, NP  neomycin-polymyxin b-dexamethasone (MAXITROL) 3.5-10000-0.1 OINT 1 application at bedtime. 06/24/20   [provider]  omeprazole (PRILOSEC) 40 MG capsule Take 40 mg by mouth daily.    [provider]  triamcinolone (NASACORT) 55 MCG/ACT AERO  nasal inhaler Place 1 spray into the nose daily as needed (Congestion).     [provider]  warfarin (COUMADIN) 2.5 MG tablet Take 2.5 mg by mouth daily.    [provider]  zinc gluconate 50 MG tablet Take 50 mg by mouth daily.    [provider]                                                                                                                                    Past Surgical History Past Surgical History:  Procedure Laterality Date   COLONOSCOPY     Kidney stones removed  1996   SKIN CANCER EXCISION Left    2003   TRANSURETHRAL RESECTION OF BLADDER TUMOR  1996   Family History Family History  Problem Relation Age of Onset   Emphysema Father    Lung cancer Brother    Liver cancer Brother    Diabetes Mother    CAD Mother    Diabetes Sister    Stroke Sister    Colon cancer Neg Hx     Social History Social History   Tobacco Use   Smoking status: Former    Current packs/day: 0.00    Average packs/day: 1 pack/day for 30.0 years (30.0 ttl pk-yrs)    Types: Cigarettes    Start date: 07/06/1981    Quit date: 07/07/2011    Years since quitting: 11.6   Smokeless tobacco: Never  Vaping Use   Vaping status: Never Used  Substance Use Topics   Alcohol use: No    Alcohol/week: 0.0 standard drinks of alcohol   Drug use: No   Allergies Simvastatin, Albuterol, Augmentin [amoxicillin-pot clavulanate], Ciprofloxacin, Contrast media [iodinated contrast media], Flagyl [metronidazole], Latex, and Sulfa antibiotics  Review of Systems Review of Systems  Skin:  Positive for rash.    Physical Exam Vital Signs  I have reviewed the triage vital signs BP (!) 160/94   Pulse 81   Temp 98.7 F (37.1 C) (Oral)   Resp 16   Ht 5\' 11"  (1.803 m)   Wt 71.2 kg   SpO2 96%   BMI 21.89 kg/m   Physical Exam Constitutional:      General: He is not in acute distress.    Appearance: Normal appearance.  HENT:     Head: Normocephalic and atraumatic.      Nose: No congestion or rhinorrhea.  Eyes:  General:        Right eye: No discharge.        Left eye: No discharge.     Extraocular Movements: Extraocular movements intact.     Pupils: Pupils are equal, round, and reactive to light.  Cardiovascular:     Rate and Rhythm: Normal rate and regular rhythm.     Heart sounds: No murmur heard. Pulmonary:     Effort: No respiratory distress.     Breath sounds: No wheezing or rales.  Abdominal:     General: There is no distension.     Tenderness: There is no abdominal tenderness.  Musculoskeletal:        General: Normal range of motion.     Cervical back: Normal range of motion.  Skin:    General: Skin is warm and dry.     Findings: Rash present.  Neurological:     General: No focal deficit present.     Mental Status: He is alert.     ED Results and Treatments Labs (all labs ordered are listed, but only abnormal results are displayed) Labs Reviewed  CBC - Abnormal; Notable for the following components:      Result Value   WBC 10.9 (*)    All other components within normal limits  BASIC METABOLIC PANEL - Abnormal; Notable for the following components:   Potassium 3.3 (*)    Glucose, Bld 159 (*)    BUN 26 (*)    Calcium 8.8 (*)    All other components within normal limits  SEDIMENTATION RATE - Abnormal; Notable for the following components:   Sed Rate 23 (*)    All other components within normal limits  C-REACTIVE PROTEIN - Abnormal; Notable for the following components:   CRP 2.9 (*)    All other components within normal limits  LYME DISEASE SEROLOGY W/REFLEX                                                                                                                          Radiology No results found.  Pertinent labs & imaging results that were available during my care of the patient were reviewed by me and considered in my medical decision making (see MDM for details).  Medications Ordered in ED Medications - No  data to display  Procedures Procedures  (including critical care time)  Medical Decision Making / ED Course   This patient presents to the ED for concern of rash, this involves an extensive number of treatment options, and is a complaint that carries with it a high risk of complications and morbidity.  The differential diagnosis includes vasculitis, cellulitis, recommend spotted fever coagulopathy  MDM: Patient seen emergency room for evaluation of a rash.  Physical exam with a purpuric rash over the dorsum of the foot and into the ankle on the left with some surrounding erythema.  Laboratory evaluation with a potassium of 3.3, BUN 26, mild leukocytosis to 10.9, sed rate very minimally elevated at 23, CRP also minimally elevated at 2.9.  Lyme serology and [facility] spotted fever send out labs sent.  Very low suspicion for necrotizing fasciitis given no crepitus on exam no fever, no significant pain to palpation.  We will cover with doxycycline for tickborne illness but given patient's previous history suspect that this is likely vasculitic in origin.  Will also cover with prednisone and I encouraged the patient to follow-up with his rheumatologist for further evaluation of lower extremity vasculitis.  At this time he does not meet inpatient criteria for admission and is safe for discharge with outpatient follow-up.  Return precautions given which the patient voiced understanding.   Additional history obtained:  -External records from outside source obtained and reviewed including: Chart review including previous notes, labs, imaging, consultation notes   Lab Tests: -I ordered, reviewed, and interpreted labs.   The pertinent results include:   Labs Reviewed  CBC - Abnormal; Notable for the following components:      Result Value   WBC 10.9 (*)    All other  components within normal limits  BASIC METABOLIC PANEL - Abnormal; Notable for the following components:   Potassium 3.3 (*)    Glucose, Bld 159 (*)    BUN 26 (*)    Calcium 8.8 (*)    All other components within normal limits  SEDIMENTATION RATE - Abnormal; Notable for the following components:   Sed Rate 23 (*)    All other components within normal limits  C-REACTIVE PROTEIN - Abnormal; Notable for the following components:   CRP 2.9 (*)    All other components within normal limits  LYME DISEASE SEROLOGY W/REFLEX     Medicines ordered and prescription drug management: Meds ordered this encounter  Medications   doxycycline (VIBRAMYCIN) 100 MG capsule    Sig: Take 1 capsule (100 mg total) by mouth 2 (two) times daily.    Dispense:  20 capsule    Refill:  0   methylPREDNISolone (MEDROL DOSEPAK) 4 MG TBPK tablet    Sig: Take as prescribed    Dispense:  1 each    Refill:  0    -I have reviewed the patients home medicines and have made adjustments as needed  Critical interventions none   Social Determinants of Health:  Factors impacting patients care include: Works in a Animal nutritionist with many deer around   Reevaluation: After the interventions noted above, I reevaluated the patient and found that they have :stayed the same  Co morbidities that complicate the patient evaluation  Past Medical History:  Diagnosis Date   Anxiety    Aortic atherosclerosis (HCC)    Atrial fibrillation (HCC)    Basal cell carcinoma    Bladder cancer [facility])    Bladder tumor    Colon polyps    COPD (chronic obstructive pulmonary  disease) (HCC)    Cutaneous vasculitis    Diabetic peripheral neuropathy (HCC) 12/27/2020   Diverticulosis    Essential hypertension    GERD (gastroesophageal reflux disease)    History of pneumonia    Nephrolithiasis    Osteoarthritis       Dispostion: I considered admission for this patient, but at this time he does not meet inpatient criteria for admission he is  safe for discharge with outpatient follow-up     Final Clinical Impression(s) / ED Diagnoses Final diagnoses:  Vasculitis (HCC)  Rash     @PCDICTATION @    Glendora Score, MD 03/06/23 0139

## 2023-03-16 ENCOUNTER — Other Ambulatory Visit: Payer: Self-pay | Admitting: Pulmonary Disease

## 2023-03-16 ENCOUNTER — Ambulatory Visit: Payer: Medicare Other | Admitting: Pulmonary Disease

## 2023-03-16 ENCOUNTER — Encounter: Payer: Self-pay | Admitting: Pulmonary Disease

## 2023-03-16 VITALS — BP 119/63 | HR 79 | Ht 71.0 in | Wt 160.6 lb

## 2023-03-16 DIAGNOSIS — I482 Chronic atrial fibrillation, unspecified: Secondary | ICD-10-CM | POA: Diagnosis not present

## 2023-03-16 DIAGNOSIS — J449 Chronic obstructive pulmonary disease, unspecified: Secondary | ICD-10-CM

## 2023-03-16 DIAGNOSIS — J479 Bronchiectasis, uncomplicated: Secondary | ICD-10-CM

## 2023-03-16 MED ORDER — BUDESONIDE 0.25 MG/2ML IN SUSP: 0.2500 mg | Freq: Two times a day (BID) | RESPIRATORY_TRACT | 5 refills | Status: DC

## 2023-03-16 NOTE — Telephone Encounter (Signed)
Needs a new script for same medicine but e scripted in

## 2023-03-16 NOTE — Assessment & Plan Note (Signed)
He feels Trelegy did not help much. He prefers to use Xopenex nebs.  Will add budesonide to this in the future consider adding yupelri

## 2023-03-16 NOTE — Progress Notes (Signed)
   Subjective:    Patient ID: Matthew Hall, male    DOB: 08/08/1945, 77 y.o.   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   PMH - AF " Vasculitis" Albuterol caused increased heart rate, Xopenex nebs works better  34-month follow-up visit He obtained sputum culture which was negative, sputum AFB was never obtained. He feels that Trelegy does not work as well and he stopped using.  He is only taking Xopenex nebs.  He denies constant sputum production.  He has questions about liver lesion noted which were felt to be cysts on his CT. He had a rash, up on his left leg and he has been diagnosed with vasculitis.  He had a similar problem 6 years ago.  He is given doxycycline for infection and prednisone  Significant tests/ events reviewed   HRCT chest 09/2022 >> Extensive peribronchovascular nodularity and nodular consolidation, bronchiectasis and mucoid impaction bilaterally.  CT abdomen from 2019 shows right lower lobe bronchiectasis   CT chest 2009 showed >> RLL & lingular bronchectasis & areas of pleuro-parenchymal scarring. 8 mm low density lesion in liver >> stable on rpt CT 01/2008. Incidentally, not seen on CT in 1995.   FEV1 trend '95 59% >> 64% in 09/2007 >> 59% in 03/2008     09/2014  FEv1 40%, ratio 62 07/2015 FEV1 40%   PFTs 10/2022 severe airway obstruction, ratio 47, FEV1 33%, TLC normal, DLCO 50%  Anti-trypsin nml     Review of Systems neg for any significant sore throat, dysphagia, itching, sneezing, nasal congestion or excess/ purulent secretions, fever, chills, sweats, unintended wt loss, pleuritic or exertional cp, hempoptysis, orthopnea pnd or change in chronic leg swelling. Also denies presyncope, palpitations, heartburn, abdominal  pain, nausea, vomiting, diarrhea or change in bowel or urinary habits, dysuria,hematuria, rash, arthralgias, visual complaints, headache, numbness weakness or ataxia.     Objective:   Physical Exam  Gen. Pleasant, well-nourished, in no distress ENT - no thrush, no pallor/icterus,no post nasal drip Neck: No JVD, no thyromegaly, no carotid bruits Lungs: no use of accessory muscles, no dullness to percussion, clear without rales or rhonchi  Cardiovascular: Rhythm regular, heart sounds  normal, no murmurs or gallops, no peripheral edema Musculoskeletal: No deformities, no cyanosis or clubbing        Assessment & Plan:

## 2023-03-16 NOTE — Assessment & Plan Note (Signed)
Encouraged airway clearance with flutter valve Will check sputum AFB to rule out mycobacterial and monitor  for worsening bronchiectasis with CT imaging once a year

## 2023-03-16 NOTE — Patient Instructions (Addendum)
X sputum for afb  X Rx for budesonide nebs twice daily x 60 x 5 refills OK to take with xopenex   X Flutter valve

## 2023-03-19 ENCOUNTER — Telehealth: Payer: Self-pay

## 2023-03-19 ENCOUNTER — Other Ambulatory Visit (HOSPITAL_COMMUNITY): Payer: Self-pay

## 2023-03-19 ENCOUNTER — Other Ambulatory Visit: Payer: Self-pay

## 2023-03-19 DIAGNOSIS — J449 Chronic obstructive pulmonary disease, unspecified: Secondary | ICD-10-CM

## 2023-03-19 DIAGNOSIS — J479 Bronchiectasis, uncomplicated: Secondary | ICD-10-CM

## 2023-03-19 MED ORDER — BUDESONIDE 0.25 MG/2ML IN SUSP
0.2500 mg | Freq: Two times a day (BID) | RESPIRATORY_TRACT | 5 refills | Status: AC
Start: 2023-03-19 — End: ?

## 2023-03-19 NOTE — Telephone Encounter (Signed)
*  Pulm  Pharmacy Patient Advocate Encounter   Received notification from CoverMyMeds that prior authorization for Budesonide 0.25MG /2ML suspension  is required/requested.   Insurance verification completed.   The patient is insured through [facility] .   Per test claim: CANCELLED due to medication being covered under Medicare Part B/no PA needed through Pharmacy benefits.

## 2023-03-22 ENCOUNTER — Encounter: Payer: Medicare Other | Admitting: Internal Medicine

## 2023-04-12 ENCOUNTER — Emergency Department (HOSPITAL_COMMUNITY): Payer: Medicare Other

## 2023-04-12 ENCOUNTER — Inpatient Hospital Stay (HOSPITAL_COMMUNITY)
Admission: EM | Admit: 2023-04-12 | Discharge: 2023-04-18 | DRG: 065 | Disposition: A | Discharge status: Rehabilitation Facility | Payer: Medicare Other | Attending: Family Medicine | Admitting: Family Medicine

## 2023-04-12 ENCOUNTER — Other Ambulatory Visit: Payer: Self-pay

## 2023-04-12 ENCOUNTER — Encounter (HOSPITAL_COMMUNITY): Payer: Self-pay

## 2023-04-12 DIAGNOSIS — J449 Chronic obstructive pulmonary disease, unspecified: Secondary | ICD-10-CM | POA: Diagnosis present

## 2023-04-12 DIAGNOSIS — Z85828 Personal history of other malignant neoplasm of skin: Secondary | ICD-10-CM

## 2023-04-12 DIAGNOSIS — D72829 Elevated white blood cell count, unspecified: Secondary | ICD-10-CM | POA: Diagnosis present

## 2023-04-12 DIAGNOSIS — E119 Type 2 diabetes mellitus without complications: Secondary | ICD-10-CM | POA: Diagnosis not present

## 2023-04-12 DIAGNOSIS — D494 Neoplasm of unspecified behavior of bladder: Secondary | ICD-10-CM | POA: Diagnosis present

## 2023-04-12 DIAGNOSIS — F430 Acute stress reaction: Secondary | ICD-10-CM | POA: Diagnosis present

## 2023-04-12 DIAGNOSIS — Z87891 Personal history of nicotine dependence: Secondary | ICD-10-CM

## 2023-04-12 DIAGNOSIS — I776 Arteritis, unspecified: Secondary | ICD-10-CM | POA: Diagnosis present

## 2023-04-12 DIAGNOSIS — S8411XA Injury of peroneal nerve at lower leg level, right leg, initial encounter: Secondary | ICD-10-CM | POA: Diagnosis present

## 2023-04-12 DIAGNOSIS — I482 Chronic atrial fibrillation, unspecified: Secondary | ICD-10-CM | POA: Diagnosis not present

## 2023-04-12 DIAGNOSIS — Z800 Family history of malignant neoplasm of digestive organs: Secondary | ICD-10-CM | POA: Diagnosis not present

## 2023-04-12 DIAGNOSIS — E785 Hyperlipidemia, unspecified: Secondary | ICD-10-CM | POA: Diagnosis present

## 2023-04-12 DIAGNOSIS — I639 Cerebral infarction, unspecified: Principal | ICD-10-CM | POA: Diagnosis present

## 2023-04-12 DIAGNOSIS — G8311 Monoplegia of lower limb affecting right dominant side: Secondary | ICD-10-CM | POA: Diagnosis present

## 2023-04-12 DIAGNOSIS — F419 Anxiety disorder, unspecified: Secondary | ICD-10-CM | POA: Diagnosis present

## 2023-04-12 DIAGNOSIS — Z8673 Personal history of transient ischemic attack (TIA), and cerebral infarction without residual deficits: Secondary | ICD-10-CM | POA: Diagnosis present

## 2023-04-12 DIAGNOSIS — I6389 Other cerebral infarction: Secondary | ICD-10-CM | POA: Diagnosis not present

## 2023-04-12 DIAGNOSIS — M21371 Foot drop, right foot: Secondary | ICD-10-CM | POA: Diagnosis present

## 2023-04-12 DIAGNOSIS — R791 Abnormal coagulation profile: Secondary | ICD-10-CM | POA: Diagnosis present

## 2023-04-12 DIAGNOSIS — Z801 Family history of malignant neoplasm of trachea, bronchus and lung: Secondary | ICD-10-CM

## 2023-04-12 DIAGNOSIS — Z880 Allergy status to penicillin: Secondary | ICD-10-CM

## 2023-04-12 DIAGNOSIS — Z8551 Personal history of malignant neoplasm of bladder: Secondary | ICD-10-CM

## 2023-04-12 DIAGNOSIS — Z8249 Family history of ischemic heart disease and other diseases of the circulatory system: Secondary | ICD-10-CM

## 2023-04-12 DIAGNOSIS — Z833 Family history of diabetes mellitus: Secondary | ICD-10-CM

## 2023-04-12 DIAGNOSIS — Z860100 Personal history of colon polyps, unspecified: Secondary | ICD-10-CM

## 2023-04-12 DIAGNOSIS — I10 Essential (primary) hypertension: Secondary | ICD-10-CM | POA: Diagnosis present

## 2023-04-12 DIAGNOSIS — Z825 Family history of asthma and other chronic lower respiratory diseases: Secondary | ICD-10-CM

## 2023-04-12 DIAGNOSIS — Z91041 Radiographic dye allergy status: Secondary | ICD-10-CM

## 2023-04-12 DIAGNOSIS — I447 Left bundle-branch block, unspecified: Secondary | ICD-10-CM | POA: Diagnosis present

## 2023-04-12 DIAGNOSIS — E1142 Type 2 diabetes mellitus with diabetic polyneuropathy: Secondary | ICD-10-CM | POA: Diagnosis present

## 2023-04-12 DIAGNOSIS — I998 Other disorder of circulatory system: Secondary | ICD-10-CM | POA: Diagnosis not present

## 2023-04-12 DIAGNOSIS — Z8616 Personal history of COVID-19: Secondary | ICD-10-CM | POA: Diagnosis not present

## 2023-04-12 DIAGNOSIS — I4821 Permanent atrial fibrillation: Secondary | ICD-10-CM | POA: Diagnosis present

## 2023-04-12 DIAGNOSIS — F32A Depression, unspecified: Secondary | ICD-10-CM | POA: Diagnosis present

## 2023-04-12 DIAGNOSIS — Z823 Family history of stroke: Secondary | ICD-10-CM

## 2023-04-12 DIAGNOSIS — Z87442 Personal history of urinary calculi: Secondary | ICD-10-CM

## 2023-04-12 DIAGNOSIS — R202 Paresthesia of skin: Secondary | ICD-10-CM | POA: Diagnosis present

## 2023-04-12 DIAGNOSIS — I6349 Cerebral infarction due to embolism of other cerebral artery: Principal | ICD-10-CM | POA: Diagnosis present

## 2023-04-12 DIAGNOSIS — Z7901 Long term (current) use of anticoagulants: Secondary | ICD-10-CM

## 2023-04-12 DIAGNOSIS — R29701 NIHSS score 1: Secondary | ICD-10-CM | POA: Diagnosis present

## 2023-04-12 DIAGNOSIS — K219 Gastro-esophageal reflux disease without esophagitis: Secondary | ICD-10-CM | POA: Diagnosis present

## 2023-04-12 DIAGNOSIS — Z8701 Personal history of pneumonia (recurrent): Secondary | ICD-10-CM

## 2023-04-12 DIAGNOSIS — Z79899 Other long term (current) drug therapy: Secondary | ICD-10-CM

## 2023-04-12 DIAGNOSIS — Z888 Allergy status to other drugs, medicaments and biological substances status: Secondary | ICD-10-CM

## 2023-04-12 DIAGNOSIS — Z91040 Latex allergy status: Secondary | ICD-10-CM

## 2023-04-12 DIAGNOSIS — M1990 Unspecified osteoarthritis, unspecified site: Secondary | ICD-10-CM | POA: Diagnosis present

## 2023-04-12 DIAGNOSIS — Z882 Allergy status to sulfonamides status: Secondary | ICD-10-CM

## 2023-04-12 DIAGNOSIS — I6523 Occlusion and stenosis of bilateral carotid arteries: Secondary | ICD-10-CM | POA: Diagnosis not present

## 2023-04-12 LAB — CBC
HCT: 44.3 % (ref 39.0–52.0)
Hemoglobin: 14 g/dL (ref 13.0–17.0)
MCH: 29.5 pg (ref 26.0–34.0)
MCHC: 31.6 g/dL (ref 30.0–36.0)
MCV: 93.3 fL (ref 80.0–100.0)
Platelets: 414 10*3/uL — ABNORMAL HIGH (ref 150–400)
RBC: 4.75 MIL/uL (ref 4.22–5.81)
RDW: 13.5 % (ref 11.5–15.5)
WBC: 13.1 10*3/uL — ABNORMAL HIGH (ref 4.0–10.5)
nRBC: 0 % (ref 0.0–0.2)

## 2023-04-12 LAB — RAPID URINE DRUG SCREEN, HOSP PERFORMED
Amphetamines: NOT DETECTED
Barbiturates: NOT DETECTED
Benzodiazepines: POSITIVE — AB
Cocaine: NOT DETECTED
Opiates: NOT DETECTED
Tetrahydrocannabinol: NOT DETECTED

## 2023-04-12 LAB — COMPREHENSIVE METABOLIC PANEL
ALT: 21 U/L (ref 0–44)
AST: 15 U/L (ref 15–41)
Albumin: 3 g/dL — ABNORMAL LOW (ref 3.5–5.0)
Alkaline Phosphatase: 48 U/L (ref 38–126)
Anion gap: 11 (ref 5–15)
BUN: 27 mg/dL — ABNORMAL HIGH (ref 8–23)
CO2: 28 mmol/L (ref 22–32)
Calcium: 8.9 mg/dL (ref 8.9–10.3)
Chloride: 96 mmol/L — ABNORMAL LOW (ref 98–111)
Creatinine, Ser: 1.01 mg/dL (ref 0.61–1.24)
GFR, Estimated: >60 mL/min (ref >60)
Glucose, Bld: 111 mg/dL — ABNORMAL HIGH (ref 70–99)
Potassium: 3.5 mmol/L (ref 3.5–5.1)
Sodium: 135 mmol/L (ref 135–145)
Total Bilirubin: 0.6 mg/dL (ref 0.3–1.2)
Total Protein: 7 g/dL (ref 6.5–8.1)

## 2023-04-12 LAB — URINALYSIS, ROUTINE W REFLEX MICROSCOPIC
Bilirubin Urine: NEGATIVE
Glucose, UA: NEGATIVE mg/dL
Hgb urine dipstick: NEGATIVE
Ketones, ur: NEGATIVE mg/dL
Leukocytes,Ua: NEGATIVE
Nitrite: NEGATIVE
Protein, ur: NEGATIVE mg/dL
Specific Gravity, Urine: 1.014 (ref 1.005–1.030)
pH: 7 (ref 5.0–8.0)

## 2023-04-12 LAB — I-STAT CHEM 8, ED
BUN: 30 mg/dL — ABNORMAL HIGH (ref 8–23)
Calcium, Ion: 1.19 mmol/L (ref 1.15–1.40)
Chloride: 98 mmol/L (ref 98–111)
Creatinine, Ser: 1 mg/dL (ref 0.61–1.24)
Glucose, Bld: 112 mg/dL — ABNORMAL HIGH (ref 70–99)
HCT: 46 % (ref 39.0–52.0)
Hemoglobin: 15.6 g/dL (ref 13.0–17.0)
Potassium: 3.7 mmol/L (ref 3.5–5.1)
Sodium: 137 mmol/L (ref 135–145)
TCO2: 29 mmol/L (ref 22–32)

## 2023-04-12 LAB — DIFFERENTIAL
Abs Immature Granulocytes: 0.23 10*3/uL — ABNORMAL HIGH (ref 0.00–0.07)
Basophils Absolute: 0.1 10*3/uL (ref 0.0–0.1)
Basophils Relative: 0 %
Eosinophils Absolute: 0.1 10*3/uL (ref 0.0–0.5)
Eosinophils Relative: 1 %
Immature Granulocytes: 2 %
Lymphocytes Relative: 9 %
Lymphs Abs: 1.1 10*3/uL (ref 0.7–4.0)
Monocytes Absolute: 1.2 10*3/uL — ABNORMAL HIGH (ref 0.1–1.0)
Monocytes Relative: 10 %
Neutro Abs: 10.4 10*3/uL — ABNORMAL HIGH (ref 1.7–7.7)
Neutrophils Relative %: 78 %

## 2023-04-12 LAB — ETHANOL: Alcohol, Ethyl (B): <10 mg/dL (ref <10)

## 2023-04-12 LAB — SEDIMENTATION RATE: Sed Rate: 38 mm/h — ABNORMAL HIGH (ref 0–16)

## 2023-04-12 LAB — PROTIME-INR
INR: 3.4 — ABNORMAL HIGH (ref 0.8–1.2)
Prothrombin Time: 34.7 s — ABNORMAL HIGH (ref 11.4–15.2)

## 2023-04-12 LAB — APTT: aPTT: 40 s — ABNORMAL HIGH (ref 24–36)

## 2023-04-12 MED ORDER — STROKE: EARLY STAGES OF RECOVERY BOOK
Freq: Once | Status: AC
  Filled 2023-04-12: qty 1

## 2023-04-12 MED ORDER — ALBUTEROL SULFATE (2.5 MG/3ML) 0.083% IN NEBU: 2.5000 mg | INHALATION_SOLUTION | Freq: Four times a day (QID) | RESPIRATORY_TRACT | Status: DC | PRN

## 2023-04-12 MED ORDER — ACETAMINOPHEN 650 MG RE SUPP: 650.0000 mg | RECTAL | Status: DC | PRN

## 2023-04-12 MED ORDER — ACETAMINOPHEN 325 MG PO TABS: 650.0000 mg | ORAL_TABLET | ORAL | Status: DC | PRN

## 2023-04-12 MED ORDER — SENNOSIDES-DOCUSATE SODIUM 8.6-50 MG PO TABS: 1.0000 | ORAL_TABLET | Freq: Every evening | ORAL | Status: DC | PRN

## 2023-04-12 MED ORDER — PANTOPRAZOLE SODIUM 40 MG PO TBEC
40.0000 mg | DELAYED_RELEASE_TABLET | Freq: Every day | ORAL | Status: DC
  Administered 2023-04-13 – 2023-04-18 (×6): 40 mg via ORAL
  Filled 2023-04-12 (×6): qty 1

## 2023-04-12 MED ORDER — METOPROLOL SUCCINATE ER 25 MG PO TB24
25.0000 mg | ORAL_TABLET | Freq: Two times a day (BID) | ORAL | Status: DC
  Administered 2023-04-12 – 2023-04-18 (×11): 25 mg via ORAL
  Filled 2023-04-12 (×12): qty 1

## 2023-04-12 MED ORDER — ALPRAZOLAM 0.25 MG PO TABS
0.2500 mg | ORAL_TABLET | Freq: Two times a day (BID) | ORAL | Status: DC
  Administered 2023-04-12 – 2023-04-18 (×12): 0.25 mg via ORAL
  Filled 2023-04-12 (×12): qty 1

## 2023-04-12 MED ORDER — ACETAMINOPHEN 160 MG/5ML PO SOLN: 650.0000 mg | ORAL | Status: DC | PRN

## 2023-04-12 MED ORDER — PREDNISONE 5 MG PO TABS
10.0000 mg | ORAL_TABLET | Freq: Two times a day (BID) | ORAL | Status: DC
  Administered 2023-04-13 – 2023-04-18 (×11): 10 mg via ORAL
  Filled 2023-04-12 (×11): qty 2

## 2023-04-12 NOTE — Assessment & Plan Note (Addendum)
Presenting with right foot drop, no other focal deficits.  MRI showing- Punctate acute infarcts in the right temporal lobe, right basal ganglia, and left frontal white matter. Query central emboli. 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  Also reports chronic dental issues.  -Multiple acute on chronic infarcts while on anticoagulation with warfarin with INR/slightly supratherapeutic at 3.4. - Need to consider other etiology related to vasculitis, considering hx -Check ESR CRP -Lipid panel, hemoglobin A1c - ECHO - EDP talked to Dr. Petra Kuba, admit to [facility], may need TEE, warfarin to prevent ICH -Continue home prednisone 10mg  daily - Mild Leukocytosis of 13.1- likely acute stress reaction, with dental issues and brain infarcts, will obtain Blood cultures - PT, OT evaluation -Stroke swallow screen

## 2023-04-12 NOTE — Consult Note (Addendum)
Neurology Consultation  Reason for Consult: Stroke Referring Physician: Dr. Mariea Clonts   CC: Right leg numbness, weakness and heaviness  History is obtained from: Patient, chart  HPI: Matthew Hall is a 76 y.o. male past medical history of atrial fibrillation on Coumadin, COPD, hypertension, vasculitis, diabetes, diabetic neuropathy-presented to [facility] for evaluation of right leg heaviness numbness and weakness for 2 days.  He reports that his legs usually have numbness from his neuropathy but for the past 2 days he noted that his right leg especially the right foot was feeling extremely numb and when he tried to walk on the ground, he did not have good sensation of the ground. No other neurological symptoms He was seen in the emergency department on March 05, 2023 with a rash to bilateral feet was discharged home on doxycycline for possible tickborne illness but also prednisone considering history of vasculitis which was suspected to be the etiology. Rheumatology follow-up was recommended. Currently on prednisone.  Discussion about anticoagulation-was diagnosed with atrial fibrillation greater than 20 years ago.  Was put on Coumadin for about 2 years and then discontinued but then saw his cardiologist and family doctor again who recommended he go back on anticoagulation.  A few years ago when this discussion was had, his niece, who is a pharmacist recommended Coumadin because the DOACs did not have an antidote at that time.   LKW: 2 days ago IV thrombolysis given?: no, last known well 2 days ago-outside the window EVT: No-same as above Premorbid modified Rankin scale (mRS): 2   ROS: Full ROS was performed and is negative except as noted in the HPI.   Past Medical History:  Diagnosis Date   Anxiety    Aortic atherosclerosis (HCC)    Atrial fibrillation (HCC)    Basal cell carcinoma    Bladder cancer [facility])    Bladder tumor    Colon polyps    COPD (chronic obstructive  pulmonary disease) (HCC)    Cutaneous vasculitis    Diabetic peripheral neuropathy (HCC) 12/27/2020   Diverticulosis    Essential hypertension    GERD (gastroesophageal reflux disease)    History of pneumonia    Nephrolithiasis    Osteoarthritis    Family History  Problem Relation Age of Onset   Emphysema Father    Lung cancer Brother    Liver cancer Brother    Diabetes Mother    CAD Mother    Diabetes Sister    Stroke Sister    Colon cancer Neg Hx    Social History:   reports that he quit smoking about 11 years ago. His smoking use included cigarettes. He started smoking about 41 years ago. He has a 30 pack-year smoking history. He has never used smokeless tobacco. He reports that he does not drink alcohol and does not use drugs.  Medications  Current Facility-Administered Medications:    [START ON 04/13/2023]  stroke: early stages of recovery book, , Does not apply, Once, Emokpae, Ejiroghene E, MD   acetaminophen (TYLENOL) tablet 650 mg, 650 mg, Oral, Q4H PRN **OR** acetaminophen (TYLENOL) 160 MG/5ML solution 650 mg, 650 mg, Per Tube, Q4H PRN **OR** acetaminophen (TYLENOL) suppository 650 mg, 650 mg, Rectal, Q4H PRN, Emokpae, Ejiroghene E, MD   albuterol (PROVENTIL) (2.5 MG/3ML) 0.083% nebulizer solution 2.5 mg, 2.5 mg, Nebulization, Q6H PRN, Emokpae, Ejiroghene E, MD   ALPRAZolam (XANAX) tablet 0.25 mg, 0.25 mg, Oral, BID, Emokpae, Ejiroghene E, MD   metoprolol succinate (TOPROL-XL) 24 hr tablet 25  mg, 25 mg, Oral, BID, Emokpae, Ejiroghene E, MD   [START ON 04/13/2023] pantoprazole (PROTONIX) EC tablet 40 mg, 40 mg, Oral, Daily, Emokpae, Ejiroghene E, MD   [START ON 04/13/2023] predniSONE (DELTASONE) tablet 10 mg, 10 mg, Oral, BID WC, Emokpae, Ejiroghene E, MD   senna-docusate (Senokot-S) tablet 1 tablet, 1 tablet, Oral, QHS PRN, Emokpae, Ejiroghene E, MD  Exam: Current vital signs: BP (!) 147/86 (BP Location: Right Arm)   Pulse 82   Temp 98.6 F (37 C) (Oral)   Resp 18   Ht  5\' 11"  (1.803 m)   Wt 72.6 kg   SpO2 98%   BMI 22.32 kg/m  Vital signs in last 24 hours: Temp:  [97.6 F (36.4 C)-98.6 F (37 C)] 98.6 F (37 C) (10/03 2148) Pulse Rate:  [51-85] 82 (10/03 2148) Resp:  [16-36] 18 (10/03 2148) BP: (102-155)/(56-95) 147/86 (10/03 2148) SpO2:  [90 %-98 %] 98 % (10/03 2148) Weight:  [72.6 kg] 72.6 kg (10/03 0913) GENERAL: Awake, alert in NAD HEENT: - Normocephalic and atraumatic, dry mm, no LN++, no Thyromegally LUNGS - Clear to auscultation bilaterally with no wheezes CV - S1S2 RRR, no m/r/g, equal pulses bilaterally. ABDOMEN - Soft, nontender, nondistended with normoactive BS Ext: warm, well perfused, intact peripheral pulses, no edema  NEURO:  Mental Status: AA&Ox3  Language: speech is nondysarthric.  Naming, repetition, fluency, and comprehension intact. Cranial Nerves: PERRL EOMI, visual fields full, no facial asymmetry, facial sensation intact, hearing intact, tongue/uvula/soft palate midline, normal sternocleidomastoid and trapezius muscle strength. No evidence of tongue atrophy or fibrillations Motor: No drift in any of the 4 extremities Tone: is normal and bulk is normal Sensation-diminished on the right foot and leg in comparison to the left. Coordination: FTN intact bilaterally Gait- deferred  NIHSS-1  Labs I have reviewed labs in epic and the results pertinent to this consultation are:  CBC    Component Value Date/Time   WBC 13.1 (H) 04/12/2023 0939   RBC 4.75 04/12/2023 0939   HGB 15.6 04/12/2023 0948   HCT 46.0 04/12/2023 0948   PLT 414 (H) 04/12/2023 0939   MCV 93.3 04/12/2023 0939   MCH 29.5 04/12/2023 0939   MCHC 31.6 04/12/2023 0939   RDW 13.5 04/12/2023 0939   LYMPHSABS 1.1 04/12/2023 0939   MONOABS 1.2 (H) 04/12/2023 0939   EOSABS 0.1 04/12/2023 0939   BASOSABS 0.1 04/12/2023 0939    CMP     Component Value Date/Time   NA 137 04/12/2023 0948   K 3.7 04/12/2023 0948   CL 98 04/12/2023 0948   CO2 28  04/12/2023 0939   GLUCOSE 112 (H) 04/12/2023 0948   BUN 30 (H) 04/12/2023 0948   CREATININE 1.00 04/12/2023 0948   CREATININE 1.03 02/23/2017 0902   CALCIUM 8.9 04/12/2023 0939   PROT 7.0 04/12/2023 0939   PROT 6.7 12/01/2020 0936   ALBUMIN 3.0 (L) 04/12/2023 0939   AST 15 04/12/2023 0939   ALT 21 04/12/2023 0939   ALKPHOS 48 04/12/2023 0939   BILITOT 0.6 04/12/2023 0939   GFRNONAA >60 04/12/2023 0939   GFRAA >60 08/09/2017 0322  INR 3.4  Imaging I have reviewed the images obtained:  MRI brain reveals punctate infarcts in the right temporal lobe, right basal ganglia and left frontal white matter concerning for cardioembolic etiology. MRI of the head is motion degraded without evidence of large vessel occlusion.  Possible moderate left A1 and severe distal right A2 stenosis.   Assessment: 77 year old past history of atrial  fibrillation on Coumadin, COPD, hypertension, vasculitis, diabetes, diabetic neuropathy presenting for evaluation of right-sided lower extremity numbness weakness and tingling-MRI with scattered punctate embolic looking strokes in bilateral cerebral hemispheres raising suspicion for cardioembolic etiology. On Coumadin for A-fib for many years-INR therapeutic. Will need further workup to rule out intracardiac thrombus and consideration for DOAC.  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 A1c Lipid panel PT OT Speech therapy Stroke neurology will follow with you  - Milon Dikes, MD Neurologist [facility] Pager: [phone]

## 2023-04-12 NOTE — ED Provider Notes (Signed)
[place] EMERGENCY DEPARTMENT AT [facility] Provider Note   CSN: [id number] Arrival date & time: 04/12/23  5621     History  Chief Complaint  Patient presents with   Numbness   Leg Pain    Matthew Hall is a 77 y.o. male.  Patient in because family concerned about DVT to the right lower extremity.  Because patient has had some discomfort in the calf there.  Patient also with complaint of kind of dragging his leg for 2 days on the right side.  He has had some trouble with neuropathy in the past.  But he says this is much more pronounced.  Denies any other extremity weakness or numbness.  And denies any headache speech or visual changes.  Patient is on Coumadin.  Has a history of atrial fibrillation COPD history of bladder cancer gastroesophageal reflux disease hypertension and diabetic peripheral neuropathy diagnosed in 2022.  Patient had transurethral resection of the of the bladder for tumors in 1996.  Patient former smoker quit in 2012.       Home Medications Prior to Admission medications   Medication Sig Start Date End Date Taking? Authorizing Provider  Acetaminophen (TYLENOL) 325 MG CAPS Take 1 capsule by mouth in the morning and at bedtime.   Yes [provider]  ALPRAZolam (XANAX) 0.25 MG tablet Take 0.25 mg by mouth 2 (two) times daily as needed for anxiety or sleep.  07/01/14  Yes [provider]  clotrimazole-betamethasone (LOTRISONE) cream Apply 1 Application topically 2 (two) times daily. 04/04/23  Yes [provider]  Cyanocobalamin (VITAMIN B12 PO) Take 1 tablet by mouth daily.   Yes [provider]  D3-50 50000 units capsule Take 50,000 Units by mouth once a week. Usually Tuesday or Wednesday. 11/23/17  Yes [provider]  ELDERBERRY PO Take 1 tablet by mouth daily.   Yes [provider]  esomeprazole (NEXIUM) 40 MG capsule Take 40 mg by mouth daily at 12 noon.   Yes [provider]   levalbuterol (XOPENEX) 0.63 MG/3ML nebulizer solution Take 3 mLs (0.63 mg total) by nebulization every 6 (six) hours as needed for wheezing or shortness of breath. 07/25/19  Yes Coral Ceo, NP  losartan-hydrochlorothiazide (HYZAAR) 50-12.5 MG tablet Take 1 tablet by mouth daily. Pt takes 1/2 tablet daily   Yes [provider]  metoprolol succinate (TOPROL XL) 25 MG 24 hr tablet Take 1 tablet (25 mg total) by mouth 2 (two) times daily. 07/28/22  Yes Sharlene Dory, NP  neomycin-polymyxin b-dexamethasone (MAXITROL) 3.5-10000-0.1 OINT 1 application at bedtime. 06/24/20  Yes [provider]  predniSONE (DELTASONE) 5 MG tablet Take 10 mg by mouth 2 (two) times daily with a meal.   Yes [provider]  triamcinolone (NASACORT) 55 MCG/ACT AERO nasal inhaler Place 1 spray into the nose daily as needed (Congestion).    Yes [provider]  warfarin (COUMADIN) 2.5 MG tablet Take 2.5 mg by mouth daily.   Yes [provider]  zinc gluconate 50 MG tablet Take 50 mg by mouth 2 (two) times a week.   Yes [provider]  doxycycline (VIBRAMYCIN) 100 MG capsule Take 1 capsule (100 mg total) by mouth 2 (two) times daily. 03/05/23   Kommor, [place], MD      Allergies    Simvastatin, Albuterol, Augmentin [amoxicillin-pot clavulanate], Ciprofloxacin, Contrast media [iodinated contrast media], Flagyl [metronidazole], Latex, and Sulfa antibiotics    Review of Systems   Review of Systems  Constitutional:  Negative for chills and fever.  HENT:  Negative for ear pain and sore throat.   Eyes:  Negative for pain and visual disturbance.  Respiratory:  Negative for cough and shortness of breath.   Cardiovascular:  Positive for leg swelling. Negative for chest pain and palpitations.  Gastrointestinal:  Negative for abdominal pain and vomiting.  Genitourinary:  Negative for dysuria and hematuria.  Musculoskeletal:  Negative for arthralgias and back pain.  Skin:   Negative for color change and rash.  Neurological:  Positive for weakness and numbness. Negative for seizures and syncope.  All other systems reviewed and are negative.   Physical Exam Updated Vital Signs BP (!) 147/94   Pulse (!) 51   Temp 97.7 F (36.5 C) (Oral)   Resp (!) 21   Ht 1.803 m (5\' 11" )   Wt 72.6 kg   SpO2 95%   BMI 22.32 kg/m  Physical Exam Vitals and nursing note reviewed.  Constitutional:      General: He is not in acute distress.    Appearance: He is well-developed.  HENT:     Head: Normocephalic and atraumatic.     Mouth/Throat:     Mouth: Mucous membranes are moist.  Eyes:     Conjunctiva/sclera: Conjunctivae normal.  Cardiovascular:     Rate and Rhythm: Normal rate and regular rhythm.     Heart sounds: No murmur heard. Pulmonary:     Effort: Pulmonary effort is normal. No respiratory distress.     Breath sounds: Normal breath sounds.  Abdominal:     Palpations: Abdomen is soft.     Tenderness: There is no abdominal tenderness.  Musculoskeletal:        General: Swelling and tenderness present.     Cervical back: Normal range of motion and neck supple. No rigidity.     Comments: Slight swelling to the right calf with tenderness.  No erythema.  Good cap refill distally to both feet.  Skin:    General: Skin is warm and dry.     Capillary Refill: Capillary refill takes less than 2 seconds.  Neurological:     Mental Status: He is alert.     Cranial Nerves: No cranial nerve deficit.     Sensory: Sensory deficit present.     Motor: Weakness present.     Comments: Patient with fairly remarkable weakness to dorsiflexion of the right foot.  Consistent with like a foot drop.  Patient also has numbness predominantly on the bottom of the foot.  Psychiatric:        Mood and Affect: Mood normal.     ED Results / Procedures / Treatments   Labs (all labs ordered are listed, but only abnormal results are displayed) Labs Reviewed  PROTIME-INR - Abnormal;  Notable for the following components:      Result Value   Prothrombin Time 34.7 (*)    INR 3.4 (*)    All other components within normal limits  APTT - Abnormal; Notable for the following components:   aPTT 40 (*)    All other components within normal limits  CBC - Abnormal; Notable for the following components:   WBC 13.1 (*)    Platelets 414 (*)    All other components within normal limits  DIFFERENTIAL - Abnormal; Notable for the following components:   Neutro Abs 10.4 (*)    Monocytes Absolute 1.2 (*)    Abs Immature Granulocytes 0.23 (*)    All other components within normal  limits  COMPREHENSIVE METABOLIC PANEL - Abnormal; Notable for the following components:   Chloride 96 (*)    Glucose, Bld 111 (*)    BUN 27 (*)    Albumin 3.0 (*)    All other components within normal limits  RAPID URINE DRUG SCREEN, HOSP PERFORMED - Abnormal; Notable for the following components:   Benzodiazepines POSITIVE (*)    All other components within normal limits  I-STAT CHEM 8, ED - Abnormal; Notable for the following components:   BUN 30 (*)    Glucose, Bld 112 (*)    All other components within normal limits  ETHANOL  URINALYSIS, ROUTINE W REFLEX MICROSCOPIC    EKG EKG Interpretation Date/Time:  Thursday April 12 2023 09:28:50 EDT Ventricular Rate:  62 PR Interval:    QRS Duration:  133 QT Interval:  420 QTC Calculation: 427 R Axis:   241  Text Interpretation: Atrial fibrillation Nonspecific IVCD with LAD Anterolateral infarct, old No significant change since last tracing Confirmed by Vanetta Mulders [phone]) on 04/12/2023 9:38:00 AM  Radiology CT HEAD WO CONTRAST  Result Date: 04/12/2023 CLINICAL DATA:  Neuro deficit, acute, stroke suspected New right foot drop EXAM: CT HEAD WITHOUT CONTRAST TECHNIQUE: Contiguous axial images were obtained from the base of the skull through the vertex without intravenous contrast. RADIATION DOSE REDUCTION: This exam was performed according to  the departmental dose-optimization program which includes automated exposure control, adjustment of the mA and/or kV according to patient size and/or use of iterative reconstruction technique. COMPARISON:  CT Head 07/24/15 FINDINGS: Brain: No hemorrhage. No hydrocephalus. No extra-axial fluid collection. No CT evidence of an acute cortical infarct. There is sequela of mild chronic microvascular ischemic change. No mass effect. No mass lesion Vascular: No hyperdense vessel or unexpected calcification. Skull: Normal. Negative for fracture or focal lesion. Sinuses/Orbits: No middle ear or mastoid effusion. Paranasal sinuses are clear. Bilateral lens replacement. Orbits are otherwise unremarkable. Other: None. IMPRESSION: No hemorrhage or CT evidence of an acute cortical infarct. If there is persistent clinical concern for acute infarct, consider further evaluation with brain MRI. Electronically Signed   By: Lorenza Cambridge M.D.   On: 04/12/2023 12:08   US Venous Img Lower Right (DVT Study)  Result Date: 04/12/2023 CLINICAL DATA:  Right lower extremity pain and swelling EXAM: RIGHT LOWER EXTREMITY VENOUS DOPPLER ULTRASOUND TECHNIQUE: Gray-scale sonography with compression, as well as color and duplex ultrasound, were performed to evaluate the deep venous system(s) from the level of the common femoral vein through the popliteal and proximal calf veins. COMPARISON:  None Available. FINDINGS: VENOUS Normal compressibility of the common femoral, superficial femoral, and popliteal veins, as well as the visualized calf veins. Visualized portions of profunda femoral vein and great saphenous vein unremarkable. No filling defects to suggest DVT on grayscale or color Doppler imaging. Doppler waveforms show normal direction of venous flow, normal respiratory plasticity and response to augmentation. Limited views of the contralateral common femoral vein are unremarkable. OTHER None. Limitations: none IMPRESSION: Negative.  Electronically Signed   By: Malachy Moan M.D.   On: 04/12/2023 11:04    Procedures Procedures    Medications Ordered in ED Medications - No data to display  ED Course/ Medical Decision Making/ A&P                                 Medical Decision Making Amount and/or Complexity of Data Reviewed Labs: ordered. Radiology: ordered.  CRITICAL CARE Performed by: Vanetta Mulders Total critical care time: 40 minutes Critical care time was exclusive of separately billable procedures and treating other patients. Critical care was necessary to treat or prevent imminent or life-threatening deterioration. Critical care was time spent personally by me on the following activities: development of treatment plan with patient and/or surrogate as well as nursing, discussions with consultants, evaluation of patient's response to treatment, examination of patient, obtaining history from patient or surrogate, ordering and performing treatments and interventions, ordering and review of laboratory studies, ordering and review of radiographic studies, pulse oximetry and re-evaluation of patient's condition.  Patient's right foot drop concerning for possible CVA.  Patient states that it occurred 2 days ago.  Certainly present yesterday.  Doppler study showed no signs for concerns for DVT.  Urine drug screen only positive for benzos.  Urinalysis negative alcohol level less than 10 patient's INR is 3.4 he is on Coumadin a little marginal.  CBC white count 13.1 hemoglobin 14.0 platelets 414.  Complete metabolic panel renal function is normal with a GFR of greater than 60.  Albumin a little low at 3.  Anion gap normal.  Head CT without any evidence of any acute event.  They recommended MRI.  MRI ordered and is pending.  Was told by the tech the MRI was somewhat concerning for perhaps a acute infarct.  Still awaiting radiology read.    Final Clinical Impression(s) / ED Diagnoses Final diagnoses:   Cerebrovascular accident (CVA), unspecified mechanism [facility])    Rx / DC Orders ED Discharge Orders     None         Vanetta Mulders, MD 04/12/23 1528

## 2023-04-12 NOTE — Plan of Care (Signed)
  Problem: Education: Goal: Knowledge of General Education information will improve Description Including pain rating scale, medication(s)/side effects and non-pharmacologic comfort measures Outcome: Progressing   

## 2023-04-12 NOTE — Assessment & Plan Note (Signed)
-   Confirms taking Xanax 0.25 mg twice daily, will resume

## 2023-04-12 NOTE — Assessment & Plan Note (Addendum)
Stable.' -Hold losartan HCTZ in setting of acute CVA, allow for permissive hypertension

## 2023-04-12 NOTE — ED Notes (Signed)
Pt able to move all extremities and was easily able to stand to take off pants/get onto the stretcher.

## 2023-04-12 NOTE — H&P (Signed)
History and Physical    Matthew Hall WUJ:[id number] DOB: October 17, 1945 DOA: 04/12/2023  PCP: Alinda Deem, MD   Patient coming from: Home  I have personally briefly reviewed patient's old medical records in [facility]  Chief Complaint: Right leg weakness  HPI: Matthew Hall is a 77 y.o. male with medical history significant for atrial fibrillation, COPD, hypertension, vasculitis, bladder cancer, diabetic neuropathy. Presented to the ED with complaints of increased numbness, and weakness of his right lower extremity.  Reports he has been dragging his right lower extremity for about 2 days.  He has also had to use his wife's rollator to help with ambulation over the past 2 days.  He reports some baseline neuropathy, he denies weakness of his other extremities.  Daughter Matthew Hall is at bedside, patient has not had any facial asymmetry, no slurred speech, no change in vision, no confusion.    Patient was in the ED 8/26, with a rash to his bilateral feet, was discharged on doxycycline for possible tickborne illness, but also prednisone considering his history of vasculitis, which was suspected so as etiology.  Followed up with his rheumatologist at [facility] as recommended -he completed the doxycycline course, he is still taking the prednisone.  ED Course: Blood pressure systolic 102-150s.  INR 3.4 WBC 13.1. UDS positive for benzos, UA clean.  Right lower extremity venous Dopplers negative for DVT. CT negative for acute abnormality Brain MRI-punctate acute infarcts in the right temporal lobe, right basal ganglia, and left frontal white matter.  Query central emboli.  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 MRI was motion degraded but without evidence of large vessel occlusion. EDP asked to neurologist Dr. Zenaida Deed admission to [facility] need TEE.  Review of Systems: As per HPI all other systems reviewed and negative.  Past  Medical History:  Diagnosis Date   Anxiety    Aortic atherosclerosis (HCC)    Atrial fibrillation (HCC)    Basal cell carcinoma    Bladder cancer [facility])    Bladder tumor    Colon polyps    COPD (chronic obstructive pulmonary disease) (HCC)    Cutaneous vasculitis    Diabetic peripheral neuropathy (HCC) 12/27/2020   Diverticulosis    Essential hypertension    GERD (gastroesophageal reflux disease)    History of pneumonia    Nephrolithiasis    Osteoarthritis     Past Surgical History:  Procedure Laterality Date   COLONOSCOPY     Kidney stones removed  1996   SKIN CANCER EXCISION Left    2003   TRANSURETHRAL RESECTION OF BLADDER TUMOR  1996     reports that he quit smoking about 11 years ago. His smoking use included cigarettes. He started smoking about 41 years ago. He has a 30 pack-year smoking history. He has never used smokeless tobacco. He reports that he does not drink alcohol and does not use drugs.  Allergies  Allergen Reactions   Simvastatin Rash   Albuterol Palpitations   Augmentin [Amoxicillin-Pot Clavulanate] Rash   Ciprofloxacin Rash   Contrast Media [Iodinated Contrast Media] Rash   Flagyl [Metronidazole] Rash   Latex Hives and Rash   Sulfa Antibiotics Rash    Family History  Problem Relation Age of Onset   Emphysema Father    Lung cancer Brother    Liver cancer Brother    Diabetes Mother    CAD Mother    Diabetes Sister    Stroke Sister    Colon  cancer Neg Hx     Prior to Admission medications   Medication Sig Start Date End Date Taking? Authorizing Provider  Acetaminophen (TYLENOL) 325 MG CAPS Take 1 capsule by mouth in the morning and at bedtime.   Yes [provider]  ALPRAZolam (XANAX) 0.25 MG tablet Take 0.25 mg by mouth 2 (two) times daily as needed for anxiety or sleep.  07/01/14  Yes [provider]  clotrimazole-betamethasone (LOTRISONE) cream Apply 1 Application topically 2 (two) times daily. 04/04/23  Yes [provider]  Cyanocobalamin (VITAMIN B12 PO) Take 1 tablet by mouth daily.   Yes [provider]  D3-50 50000 units capsule Take 50,000 Units by mouth once a week. Usually Tuesday or Wednesday. 11/23/17  Yes [provider]  ELDERBERRY PO Take 1 tablet by mouth daily.   Yes [provider]  esomeprazole (NEXIUM) 40 MG capsule Take 40 mg by mouth daily at 12 noon.   Yes [provider]  levalbuterol (XOPENEX) 0.63 MG/3ML nebulizer solution Take 3 mLs (0.63 mg total) by nebulization every 6 (six) hours as needed for wheezing or shortness of breath. 07/25/19  Yes Coral Ceo, NP  losartan-hydrochlorothiazide (HYZAAR) 50-12.5 MG tablet Take 1 tablet by mouth daily. Pt takes 1/2 tablet daily   Yes [provider]  metoprolol succinate (TOPROL XL) 25 MG 24 hr tablet Take 1 tablet (25 mg total) by mouth 2 (two) times daily. 07/28/22  Yes Sharlene Dory, NP  neomycin-polymyxin b-dexamethasone (MAXITROL) 3.5-10000-0.1 OINT 1 application at bedtime. 06/24/20  Yes [provider]  predniSONE (DELTASONE) 5 MG tablet Take 10 mg by mouth 2 (two) times daily with a meal.   Yes [provider]  triamcinolone (NASACORT) 55 MCG/ACT AERO nasal inhaler Place 1 spray into the nose daily as needed (Congestion).    Yes [provider]  warfarin (COUMADIN) 2.5 MG tablet Take 2.5 mg by mouth daily.   Yes [provider]  zinc gluconate 50 MG tablet Take 50 mg by mouth 2 (two) times a week.   Yes [provider]  doxycycline (VIBRAMYCIN) 100 MG capsule Take 1 capsule (100 mg total) by mouth 2 (two) times daily. 03/05/23   Glendora Score, MD    Physical Exam: Vitals:   04/12/23 1530 04/12/23 1600 04/12/23 1630 04/12/23 1655  BP:    (!) 153/67  Pulse: 69 68 68 78  Resp: 20 20 19 16   Temp:      TempSrc:      SpO2: 97% 97% 95% 95%  Weight:      Height:        Constitutional: NAD, calm, comfortable Vitals:   04/12/23 1530  04/12/23 1600 04/12/23 1630 04/12/23 1655  BP:    (!) 153/67  Pulse: 69 68 68 78  Resp: 20 20 19 16   Temp:      TempSrc:      SpO2: 97% 97% 95% 95%  Weight:      Height:       Eyes: PERRL, lids and conjunctivae normal ENMT: Mucous membranes are moist.  Neck: normal, supple, no masses, no thyromegaly Respiratory: clear to auscultation bilaterally, no wheezing, no crackles. Normal respiratory effort. No accessory muscle use.  Cardiovascular: Regular rate and rhythm, no murmurs / rubs / gallops.  No carotid bruits.  Feet chronically cool to touch, DP pulses palpable more in the right lower extremity than the left. Abdomen: no tenderness, no masses palpated. No hepatosplenomegaly. Bowel sounds positive.  Musculoskeletal: no  clubbing / cyanosis. No joint deformity upper and lower extremities.  Skin: Several macular- like-lesions to bilateral feet extending to ankles  Neurologic: Foot drop to right lower extremity, but otherwise 5 of 5 strength to bilateral upper and lower extremities, no facial asymmetry, speech fluent without aphasia, Psychiatric: Normal judgment and insight. Alert and oriented x 3. Normal mood.   Labs on Admission: I have personally reviewed following labs and imaging studies  CBC: Recent Labs  Lab 04/12/23 0939 04/12/23 0948  WBC 13.1*  --   NEUTROABS 10.4*  --   HGB 14.0 15.6  HCT 44.3 46.0  MCV 93.3  --   PLT 414*  --    Basic Metabolic Panel: Recent Labs  Lab 04/12/23 0939 04/12/23 0948  NA 135 137  K 3.5 3.7  CL 96* 98  CO2 28  --   GLUCOSE 111* 112*  BUN 27* 30*  CREATININE 1.01 1.00  CALCIUM 8.9  --    GFR: Estimated Creatinine Clearance: 62.5 mL/min (by C-G formula based on SCr of 1 mg/dL). Liver Function Tests: Recent Labs  Lab 04/12/23 0939  AST 15  ALT 21  ALKPHOS 48  BILITOT 0.6  PROT 7.0  ALBUMIN 3.0*   Coagulation Profile: Recent Labs  Lab 04/12/23 0939  INR 3.4*   Urine analysis:    Component Value Date/Time    COLORURINE YELLOW 04/12/2023 1429   APPEARANCEUR CLEAR 04/12/2023 1429   LABSPEC 1.014 04/12/2023 1429   PHURINE 7.0 04/12/2023 1429   GLUCOSEU NEGATIVE 04/12/2023 1429   HGBUR NEGATIVE 04/12/2023 1429   BILIRUBINUR NEGATIVE 04/12/2023 1429   KETONESUR NEGATIVE 04/12/2023 1429   PROTEINUR NEGATIVE 04/12/2023 1429   NITRITE NEGATIVE 04/12/2023 1429   LEUKOCYTESUR NEGATIVE 04/12/2023 1429    Radiological Exams on Admission: MR Brain Wo Contrast (neuro protocol)  Result Date: 04/12/2023 CLINICAL DATA:  Neuro deficit, acute, stroke suspected. Right lower extremity numbness and weakness. EXAM: MRI HEAD WITHOUT CONTRAST MRA HEAD WITHOUT CONTRAST TECHNIQUE: Multiplanar, multi-echo pulse sequences of the brain and surrounding structures were acquired without intravenous contrast. Angiographic images of the Circle of Willis were acquired using MRA technique without intravenous contrast. COMPARISON:  Head CT 04/12/2023 FINDINGS: MRI HEAD FINDINGS Brain: There are punctate acute infarcts in the medial right temporal lobe, right basal ganglia, and left frontal white matter. Chronic microhemorrhages are noted in the right corona radiata and right cerebellar hemisphere. No mass, midline shift, or extra-axial fluid collection is identified. T2 hyperintensities in the cerebral white matter are nonspecific but compatible with mild-to-moderate chronic small vessel ischemic disease. There are small chronic infarcts in the right basal ganglia, right thalamus, and right cerebellar hemisphere. There is mild-to-moderate generalized cerebral atrophy. Vascular: Major intracranial vascular flow voids are preserved. Skull and upper cervical spine: Unremarkable bone marrow signal. Sinuses/Orbits: Bilateral cataract extraction. Paranasal sinuses and mastoid air cells are clear. Other: None. MRA HEAD FINDINGS Anterior circulation: The internal carotid arteries are patent from skull base to carotid termini without evidence of a  significant stenosis. ACAs and MCAs are patent without evidence of a proximal branch occlusion or significant M1 or right A1 stenosis. There is a moderate proximal left A1 stenosis. Partial signal loss in proximal M2 vessels bilaterally is felt to be largely artifactual based on source images, however underlying stenosis is not excluded. Artifact is also present through the A2 segments, although a severe distal right A2 stenosis is questioned. No aneurysm is identified. Posterior circulation: The included portions of the intracranial vertebral arteries are  widely patent to the basilar with the left being dominant. Patent PICA and SCA origins are visualized bilaterally. The basilar artery is widely patent. Posterior communicating arteries are diminutive or absent. The PCAs are patent with distal branch vessel attenuation bilaterally but no evidence of a flow limiting proximal stenosis. No aneurysm is identified. Anatomic variants: None. IMPRESSION: 1. Punctate acute infarcts in the right temporal lobe, right basal ganglia, and left frontal white matter. Query central emboli. 2. 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 3. Motion degraded head MRA without evidence of a large vessel occlusion. Possible moderate left A1 and severe distal right A2 stenoses. Electronically Signed   By: Sebastian Ache M.D.   On: 04/12/2023 16:06   MR ANGIO HEAD WO CONTRAST  Result Date: 04/12/2023 CLINICAL DATA:  Neuro deficit, acute, stroke suspected. Right lower extremity numbness and weakness. EXAM: MRI HEAD WITHOUT CONTRAST MRA HEAD WITHOUT CONTRAST TECHNIQUE: Multiplanar, multi-echo pulse sequences of the brain and surrounding structures were acquired without intravenous contrast. Angiographic images of the Circle of Willis were acquired using MRA technique without intravenous contrast. COMPARISON:  Head CT 04/12/2023 FINDINGS: MRI HEAD FINDINGS Brain: There are punctate acute  infarcts in the medial right temporal lobe, right basal ganglia, and left frontal white matter. Chronic microhemorrhages are noted in the right corona radiata and right cerebellar hemisphere. No mass, midline shift, or extra-axial fluid collection is identified. T2 hyperintensities in the cerebral white matter are nonspecific but compatible with mild-to-moderate chronic small vessel ischemic disease. There are small chronic infarcts in the right basal ganglia, right thalamus, and right cerebellar hemisphere. There is mild-to-moderate generalized cerebral atrophy. Vascular: Major intracranial vascular flow voids are preserved. Skull and upper cervical spine: Unremarkable bone marrow signal. Sinuses/Orbits: Bilateral cataract extraction. Paranasal sinuses and mastoid air cells are clear. Other: None. MRA HEAD FINDINGS Anterior circulation: The internal carotid arteries are patent from skull base to carotid termini without evidence of a significant stenosis. ACAs and MCAs are patent without evidence of a proximal branch occlusion or significant M1 or right A1 stenosis. There is a moderate proximal left A1 stenosis. Partial signal loss in proximal M2 vessels bilaterally is felt to be largely artifactual based on source images, however underlying stenosis is not excluded. Artifact is also present through the A2 segments, although a severe distal right A2 stenosis is questioned. No aneurysm is identified. Posterior circulation: The included portions of the intracranial vertebral arteries are widely patent to the basilar with the left being dominant. Patent PICA and SCA origins are visualized bilaterally. The basilar artery is widely patent. Posterior communicating arteries are diminutive or absent. The PCAs are patent with distal branch vessel attenuation bilaterally but no evidence of a flow limiting proximal stenosis. No aneurysm is identified. Anatomic variants: None. IMPRESSION: 1. Punctate acute infarcts in the right  temporal lobe, right basal ganglia, and left frontal white matter. Query central emboli. 2. 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 3. Motion degraded head MRA without evidence of a large vessel occlusion. Possible moderate left A1 and severe distal right A2 stenoses. Electronically Signed   By: Sebastian Ache M.D.   On: 04/12/2023 16:06   CT HEAD WO CONTRAST  Result Date: 04/12/2023 CLINICAL DATA:  Neuro deficit, acute, stroke suspected New right foot drop EXAM: CT HEAD WITHOUT CONTRAST TECHNIQUE: Contiguous axial images were obtained from the base of the skull through the vertex without intravenous contrast. RADIATION DOSE REDUCTION: This exam  was performed according to the departmental dose-optimization program which includes automated exposure control, adjustment of the mA and/or kV according to patient size and/or use of iterative reconstruction technique. COMPARISON:  CT Head 07/24/15 FINDINGS: Brain: No hemorrhage. No hydrocephalus. No extra-axial fluid collection. No CT evidence of an acute cortical infarct. There is sequela of mild chronic microvascular ischemic change. No mass effect. No mass lesion Vascular: No hyperdense vessel or unexpected calcification. Skull: Normal. Negative for fracture or focal lesion. Sinuses/Orbits: No middle ear or mastoid effusion. Paranasal sinuses are clear. Bilateral lens replacement. Orbits are otherwise unremarkable. Other: None. IMPRESSION: No hemorrhage or CT evidence of an acute cortical infarct. If there is persistent clinical concern for acute infarct, consider further evaluation with brain MRI. Electronically Signed   By: Lorenza Cambridge M.D.   On: 04/12/2023 12:08   US Venous Img Lower Right (DVT Study)  Result Date: 04/12/2023 CLINICAL DATA:  Right lower extremity pain and swelling EXAM: RIGHT LOWER EXTREMITY VENOUS DOPPLER ULTRASOUND TECHNIQUE: Gray-scale sonography with compression, as well as color and  duplex ultrasound, were performed to evaluate the deep venous system(s) from the level of the common femoral vein through the popliteal and proximal calf veins. COMPARISON:  None Available. FINDINGS: VENOUS Normal compressibility of the common femoral, superficial femoral, and popliteal veins, as well as the visualized calf veins. Visualized portions of profunda femoral vein and great saphenous vein unremarkable. No filling defects to suggest DVT on grayscale or color Doppler imaging. Doppler waveforms show normal direction of venous flow, normal respiratory plasticity and response to augmentation. Limited views of the contralateral common femoral vein are unremarkable. OTHER None. Limitations: none IMPRESSION: Negative. Electronically Signed   By: Malachy Moan M.D.   On: 04/12/2023 11:04    EKG: Independently reviewed.  Atrial fibrillation rate 62, QTc 427.  No significant change from prior.  Assessment/Plan Principal Problem:   Acute CVA (cerebrovascular accident) [facility]) Active Problems:   Atrial fibrillation, chronic (HCC)   Lymphocytic vasculitis (HCC)   Essential hypertension   Paresthesia   Anxiety   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)   Assessment and Plan: * Acute CVA (cerebrovascular accident) (HCC) Presenting with right foot drop, no other focal deficits.  MRI showing- Punctate acute infarcts in the right temporal lobe, right basal ganglia, and left frontal white matter. Query central emboli. 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  Also reports chronic dental issues.  -Multiple acute on chronic infarcts while on anticoagulation with warfarin with INR/slightly supratherapeutic at 3.4. - Need to consider other etiology related to vasculitis, considering hx -Check ESR CRP -Lipid panel, hemoglobin A1c - ECHO - EDP talked to Dr. Petra Kuba, admit to [facility], may need TEE, warfarin to prevent ICH -Continue home prednisone 10mg  daily - Mild  Leukocytosis of 13.1- likely acute stress reaction, with dental issues and brain infarcts, will obtain Blood cultures - PT, OT evaluation -Stroke swallow screen  Lymphocytic vasculitis (HCC) Follows with Dr. Octaviano Glow at [place].  Recent and persistent rash to bilateral feet for which she was seen in the ED 8/26- there was concern for vasculitic etiology during his history, as against tickborne illness as etiology.  CRP 2.9, ESR was 23.  He was started on a course of steroids, and doxycycline.  He followed up with his rheumatologist, he is still taking the steroids.  Now presenting with MRI findings of multiple punctate acute infarcts, chronic infarcts also. -Continue prednisone 10 mg daily - Doubt need for stress dose steroids at this  time.  Atrial fibrillation, chronic (HCC) EKG currently showing atrial fibrillation.  Rate controlled on metoprolol and on anticoagulation with warfarin with INR slightly above therapeutic range at 3.4. -Resume home 25 mg metoprolol for rate control -Warfarin as recommended by neurology -Daily PT/INR  Essential hypertension Stable.' -Hold losartan HCTZ in setting of acute CVA, allow for permissive hypertension  Anxiety - Confirms taking Xanax 0.25 mg twice daily, will resume  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) Stable.  Quit smoking cigarettes about 10 - 12 years ago. -Resume home regimen   DVT prophylaxis: SCDS for now, Supratherapeutic INR, Code Status: FULL Code- Confirmed with patient at bedside Family Communication: Daughter Matthew Hall at bedside Disposition Plan:  > 2 days Consults called: Neurology Admission status: Inpt tele I certify that at the point of admission it is my clinical judgment that the patient will require inpatient hospital care spanning beyond 2 midnights from the point of admission due to high intensity of service, high risk for further deterioration and high frequency of surveillance required.   Author: Onnie Boer,  MD 04/12/2023 7:26 PM  For on call review [email or web address].

## 2023-04-12 NOTE — Assessment & Plan Note (Addendum)
EKG currently showing atrial fibrillation.  Rate controlled on metoprolol and on anticoagulation with warfarin with INR slightly above therapeutic range at 3.4. -Resume home 25 mg metoprolol for rate control -Warfarin as recommended by neurology -Daily PT/INR

## 2023-04-12 NOTE — ED Notes (Signed)
Patient transported to CT 

## 2023-04-12 NOTE — ED Notes (Signed)
ED TO INPATIENT HANDOFF REPORT  ED Nurse Name and Phone #: Kerrie Pleasure [phone]  S Name/Age/Gender Matthew Hall 77 y.o. male Room/Bed: APA07/APA07  Code Status   Code Status: Prior  Home/SNF/Other Home Patient oriented to: self, place, time, and situation Is this baseline? Yes   Triage Complete: Triage complete  Chief Complaint Acute CVA (cerebrovascular accident) [facility]) [I63.9]  Triage Note Pt c/o increased numbness and decreased movement in RLE.  Pain score 7/10.  Pt is able to move toes a little.  R foot is warm and cap refill is less than 3 seconds.    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   Pt takes warfarin.    Allergies Allergies  Allergen Reactions   Simvastatin Rash   Albuterol Palpitations   Augmentin [Amoxicillin-Pot Clavulanate] Rash   Ciprofloxacin Rash   Contrast Media [Iodinated Contrast Media] Rash   Flagyl [Metronidazole] Rash   Latex Hives and Rash   Sulfa Antibiotics Rash    Level of Care/Admitting Diagnosis ED Disposition     ED Disposition  Admit   Condition  --   Comment  Hospital Area: [facility] [[id number]]  Level of Care: Telemetry Medical [[id number]]  May admit patient to [facility] or [facility] if equivalent level of care is available:: No  Covid Evaluation: Recent COVID positive no isolation required infection day 21-90  Diagnosis: Acute CVA (cerebrovascular accident) [facility]) [[id number]]  Admitting Physician: Onnie Boer [[id number]]  Attending Physician: Onnie Boer Xenia.Elija Mccamish  Certification:: I certify this patient will need inpatient services for at least 2 midnights  Expected Medical Readiness: 04/15/2023          B Medical/Surgery History Past Medical History:  Diagnosis Date   Anxiety    Aortic atherosclerosis (HCC)    Atrial fibrillation (HCC)    Basal cell carcinoma    Bladder cancer (HCC)    Bladder tumor    Colon polyps    COPD (chronic obstructive pulmonary disease)  (HCC)    Cutaneous vasculitis    Diabetic peripheral neuropathy (HCC) 12/27/2020   Diverticulosis    Essential hypertension    GERD (gastroesophageal reflux disease)    History of pneumonia    Nephrolithiasis    Osteoarthritis    Past Surgical History:  Procedure Laterality Date   COLONOSCOPY     Kidney stones removed  1996   SKIN CANCER EXCISION Left    2003   TRANSURETHRAL RESECTION OF BLADDER TUMOR  1996     A IV Location/Drains/Wounds Patient Lines/Drains/Airways Status     Active Line/Drains/Airways     Name Placement date Placement time Site Days   Peripheral IV 04/12/23 20 G Distal;Left;Posterior Forearm 04/12/23  0935  Forearm  less than 1   Wound / Incision (Open or Dehisced) 10/29/20  Toe (Comment  which one) Anterior;Left bandaged 10/29/20  1904  Toe (Comment  which one)  895            Intake/Output Last 24 hours No intake or output data in the 24 hours ending 04/12/23 1902  Labs/Imaging Results for orders placed or performed during the hospital encounter of 04/12/23 (from the past 48 hour(s))  Ethanol     Status: None   Collection Time: 04/12/23  9:39 AM  Result Value Ref Range   Alcohol, Ethyl (B) <10 <10 mg/dL    Comment: (NOTE) Lowest detectable limit for serum alcohol is 10 mg/dL.  For medical purposes only. Performed at [facility],  [address], [place], [place] [address]   Protime-INR     Status: Abnormal   Collection Time: 04/12/23  9:39 AM  Result Value Ref Range   Prothrombin Time 34.7 (H) 11.4 - 15.2 seconds   INR 3.4 (H) 0.8 - 1.2    Comment: (NOTE) INR goal varies based on device and disease states. Performed at [facility], [address], [place], [place] [address]   APTT     Status: Abnormal   Collection Time: 04/12/23  9:39 AM  Result Value Ref Range   aPTT 40 (H) 24 - 36 seconds    Comment:        IF BASELINE aPTT IS ELEVATED, SUGGEST PATIENT RISK ASSESSMENT BE USED TO DETERMINE APPROPRIATE ANTICOAGULANT  THERAPY. Performed at [facility], [address], [place], [place] [address]   CBC     Status: Abnormal   Collection Time: 04/12/23  9:39 AM  Result Value Ref Range   WBC 13.1 (H) 4.0 - 10.5 K/uL   RBC 4.75 4.22 - 5.81 MIL/uL   Hemoglobin 14.0 13.0 - 17.0 g/dL   HCT 91.4 78.2 - 95.6 %   MCV 93.3 80.0 - 100.0 fL   MCH 29.5 26.0 - 34.0 pg   MCHC 31.6 30.0 - 36.0 g/dL   RDW 21.3 08.6 - 57.8 %   Platelets 414 (H) 150 - 400 K/uL   nRBC 0.0 0.0 - 0.2 %    Comment: Performed at [facility], [address], [place], [place] [address]  Differential     Status: Abnormal   Collection Time: 04/12/23  9:39 AM  Result Value Ref Range   Neutrophils Relative % 78 %   Neutro Abs 10.4 (H) 1.7 - 7.7 K/uL   Lymphocytes Relative 9 %   Lymphs Abs 1.1 0.7 - 4.0 K/uL   Monocytes Relative 10 %   Monocytes Absolute 1.2 (H) 0.1 - 1.0 K/uL   Eosinophils Relative 1 %   Eosinophils Absolute 0.1 0.0 - 0.5 K/uL   Basophils Relative 0 %   Basophils Absolute 0.1 0.0 - 0.1 K/uL   Immature Granulocytes 2 %   Abs Immature Granulocytes 0.23 (H) 0.00 - 0.07 K/uL    Comment: Performed at [facility], [address] [place], [place] [address]  Comprehensive metabolic panel     Status: Abnormal   Collection Time: 04/12/23  9:39 AM  Result Value Ref Range   Sodium 135 135 - 145 mmol/L   Potassium 3.5 3.5 - 5.1 mmol/L   Chloride 96 (L) 98 - 111 mmol/L   CO2 28 22 - 32 mmol/L   Glucose, Bld 111 (H) 70 - 99 mg/dL    Comment: Glucose reference range applies only to samples taken after fasting for at least 8 hours.   BUN 27 (H) 8 - 23 mg/dL   Creatinine, Ser 1.32 0.61 - 1.24 mg/dL   Calcium 8.9 8.9 - 44.0 mg/dL   Total Protein 7.0 6.5 - 8.1 g/dL   Albumin 3.0 (L) 3.5 - 5.0 g/dL   AST 15 15 - 41 U/L   ALT 21 0 - 44 U/L   Alkaline Phosphatase 48 38 - 126 U/L   Total Bilirubin 0.6 0.3 - 1.2 mg/dL   GFR, Estimated >10 >27 mL/min    Comment: (NOTE) Calculated using the CKD-EPI Creatinine Equation (2021)    Anion  gap 11 5 - 15    Comment: Performed at [facility], [address], [place], [place] [address]  I-stat  chem 8, ED     Status: Abnormal   Collection Time: 04/12/23  9:48 AM  Result Value Ref Range   Sodium 137 135 - 145 mmol/L   Potassium 3.7 3.5 - 5.1 mmol/L   Chloride 98 98 - 111 mmol/L   BUN 30 (H) 8 - 23 mg/dL   Creatinine, Ser 2.95 0.61 - 1.24 mg/dL   Glucose, Bld 284 (H) 70 - 99 mg/dL    Comment: Glucose reference range applies only to samples taken after fasting for at least 8 hours.   Calcium, Ion 1.19 1.15 - 1.40 mmol/L   TCO2 29 22 - 32 mmol/L   Hemoglobin 15.6 13.0 - 17.0 g/dL   HCT 13.2 44.0 - 10.2 %  Urine rapid drug screen (hosp performed)     Status: Abnormal   Collection Time: 04/12/23  2:29 PM  Result Value Ref Range   Opiates NONE DETECTED NONE DETECTED   Cocaine NONE DETECTED NONE DETECTED   Benzodiazepines POSITIVE (A) NONE DETECTED   Amphetamines NONE DETECTED NONE DETECTED   Tetrahydrocannabinol NONE DETECTED NONE DETECTED   Barbiturates NONE DETECTED NONE DETECTED    Comment: (NOTE) DRUG SCREEN FOR MEDICAL PURPOSES ONLY.  IF CONFIRMATION IS NEEDED FOR ANY PURPOSE, NOTIFY LAB WITHIN 5 DAYS.  LOWEST DETECTABLE LIMITS FOR URINE DRUG SCREEN Drug Class                     Cutoff (ng/mL) Amphetamine and metabolites    1000 Barbiturate and metabolites    200 Benzodiazepine                 200 Opiates and metabolites        300 Cocaine and metabolites        300 THC                            50 Performed at [facility], [address], [place], [place] [address]   Urinalysis, Routine w reflex microscopic -Urine, Clean Catch     Status: None   Collection Time: 04/12/23  2:29 PM  Result Value Ref Range   Color, Urine YELLOW YELLOW   APPearance CLEAR CLEAR   Specific Gravity, Urine 1.014 1.005 - 1.030   pH 7.0 5.0 - 8.0   Glucose, UA NEGATIVE NEGATIVE mg/dL   Hgb urine dipstick NEGATIVE NEGATIVE   Bilirubin Urine NEGATIVE NEGATIVE   Ketones, ur NEGATIVE  NEGATIVE mg/dL   Protein, ur NEGATIVE NEGATIVE mg/dL   Nitrite NEGATIVE NEGATIVE   Leukocytes,Ua NEGATIVE NEGATIVE    Comment: Performed at [facility], [address], [place], [place] [address]   MR Brain Wo Contrast (neuro protocol)  Result Date: 04/12/2023 CLINICAL DATA:  Neuro deficit, acute, stroke suspected. Right lower extremity numbness and weakness. EXAM: MRI HEAD WITHOUT CONTRAST MRA HEAD WITHOUT CONTRAST TECHNIQUE: Multiplanar, multi-echo pulse sequences of the brain and surrounding structures were acquired without intravenous contrast. Angiographic images of the Circle of Willis were acquired using MRA technique without intravenous contrast. COMPARISON:  Head CT 04/12/2023 FINDINGS: MRI HEAD FINDINGS Brain: There are punctate acute infarcts in the medial right temporal lobe, right basal ganglia, and left frontal white matter. Chronic microhemorrhages are noted in the right corona radiata and right cerebellar hemisphere. No mass, midline shift, or extra-axial fluid collection is identified. T2 hyperintensities in the cerebral white matter are nonspecific but compatible with mild-to-moderate chronic small vessel ischemic disease. There are small chronic  infarcts in the right basal ganglia, right thalamus, and right cerebellar hemisphere. There is mild-to-moderate generalized cerebral atrophy. Vascular: Major intracranial vascular flow voids are preserved. Skull and upper cervical spine: Unremarkable bone marrow signal. Sinuses/Orbits: Bilateral cataract extraction. Paranasal sinuses and mastoid air cells are clear. Other: None. MRA HEAD FINDINGS Anterior circulation: The internal carotid arteries are patent from skull base to carotid termini without evidence of a significant stenosis. ACAs and MCAs are patent without evidence of a proximal branch occlusion or significant M1 or right A1 stenosis. There is a moderate proximal left A1 stenosis. Partial signal loss in proximal M2 vessels bilaterally  is felt to be largely artifactual based on source images, however underlying stenosis is not excluded. Artifact is also present through the A2 segments, although a severe distal right A2 stenosis is questioned. No aneurysm is identified. Posterior circulation: The included portions of the intracranial vertebral arteries are widely patent to the basilar with the left being dominant. Patent PICA and SCA origins are visualized bilaterally. The basilar artery is widely patent. Posterior communicating arteries are diminutive or absent. The PCAs are patent with distal branch vessel attenuation bilaterally but no evidence of a flow limiting proximal stenosis. No aneurysm is identified. Anatomic variants: None. IMPRESSION: 1. Punctate acute infarcts in the right temporal lobe, right basal ganglia, and left frontal white matter. Query central emboli. 2. 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 3. Motion degraded head MRA without evidence of a large vessel occlusion. Possible moderate left A1 and severe distal right A2 stenoses. Electronically Signed   By: Sebastian Ache M.D.   On: 04/12/2023 16:06   MR ANGIO HEAD WO CONTRAST  Result Date: 04/12/2023 CLINICAL DATA:  Neuro deficit, acute, stroke suspected. Right lower extremity numbness and weakness. EXAM: MRI HEAD WITHOUT CONTRAST MRA HEAD WITHOUT CONTRAST TECHNIQUE: Multiplanar, multi-echo pulse sequences of the brain and surrounding structures were acquired without intravenous contrast. Angiographic images of the Circle of Willis were acquired using MRA technique without intravenous contrast. COMPARISON:  Head CT 04/12/2023 FINDINGS: MRI HEAD FINDINGS Brain: There are punctate acute infarcts in the medial right temporal lobe, right basal ganglia, and left frontal white matter. Chronic microhemorrhages are noted in the right corona radiata and right cerebellar hemisphere. No mass, midline shift, or extra-axial fluid  collection is identified. T2 hyperintensities in the cerebral white matter are nonspecific but compatible with mild-to-moderate chronic small vessel ischemic disease. There are small chronic infarcts in the right basal ganglia, right thalamus, and right cerebellar hemisphere. There is mild-to-moderate generalized cerebral atrophy. Vascular: Major intracranial vascular flow voids are preserved. Skull and upper cervical spine: Unremarkable bone marrow signal. Sinuses/Orbits: Bilateral cataract extraction. Paranasal sinuses and mastoid air cells are clear. Other: None. MRA HEAD FINDINGS Anterior circulation: The internal carotid arteries are patent from skull base to carotid termini without evidence of a significant stenosis. ACAs and MCAs are patent without evidence of a proximal branch occlusion or significant M1 or right A1 stenosis. There is a moderate proximal left A1 stenosis. Partial signal loss in proximal M2 vessels bilaterally is felt to be largely artifactual based on source images, however underlying stenosis is not excluded. Artifact is also present through the A2 segments, although a severe distal right A2 stenosis is questioned. No aneurysm is identified. Posterior circulation: The included portions of the intracranial vertebral arteries are widely patent to the basilar with the left being dominant. Patent PICA and SCA origins are visualized bilaterally. The basilar artery is  widely patent. Posterior communicating arteries are diminutive or absent. The PCAs are patent with distal branch vessel attenuation bilaterally but no evidence of a flow limiting proximal stenosis. No aneurysm is identified. Anatomic variants: None. IMPRESSION: 1. Punctate acute infarcts in the right temporal lobe, right basal ganglia, and left frontal white matter. Query central emboli. 2. 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 3. Motion degraded head MRA without evidence of  a large vessel occlusion. Possible moderate left A1 and severe distal right A2 stenoses. Electronically Signed   By: Sebastian Ache M.D.   On: 04/12/2023 16:06   CT HEAD WO CONTRAST  Result Date: 04/12/2023 CLINICAL DATA:  Neuro deficit, acute, stroke suspected New right foot drop EXAM: CT HEAD WITHOUT CONTRAST TECHNIQUE: Contiguous axial images were obtained from the base of the skull through the vertex without intravenous contrast. RADIATION DOSE REDUCTION: This exam was performed according to the departmental dose-optimization program which includes automated exposure control, adjustment of the mA and/or kV according to patient size and/or use of iterative reconstruction technique. COMPARISON:  CT Head 07/24/15 FINDINGS: Brain: No hemorrhage. No hydrocephalus. No extra-axial fluid collection. No CT evidence of an acute cortical infarct. There is sequela of mild chronic microvascular ischemic change. No mass effect. No mass lesion Vascular: No hyperdense vessel or unexpected calcification. Skull: Normal. Negative for fracture or focal lesion. Sinuses/Orbits: No middle ear or mastoid effusion. Paranasal sinuses are clear. Bilateral lens replacement. Orbits are otherwise unremarkable. Other: None. IMPRESSION: No hemorrhage or CT evidence of an acute cortical infarct. If there is persistent clinical concern for acute infarct, consider further evaluation with brain MRI. Electronically Signed   By: Lorenza Cambridge M.D.   On: 04/12/2023 12:08   US Venous Img Lower Right (DVT Study)  Result Date: 04/12/2023 CLINICAL DATA:  Right lower extremity pain and swelling EXAM: RIGHT LOWER EXTREMITY VENOUS DOPPLER ULTRASOUND TECHNIQUE: Gray-scale sonography with compression, as well as color and duplex ultrasound, were performed to evaluate the deep venous system(s) from the level of the common femoral vein through the popliteal and proximal calf veins. COMPARISON:  None Available. FINDINGS: VENOUS Normal compressibility of the  common femoral, superficial femoral, and popliteal veins, as well as the visualized calf veins. Visualized portions of profunda femoral vein and great saphenous vein unremarkable. No filling defects to suggest DVT on grayscale or color Doppler imaging. Doppler waveforms show normal direction of venous flow, normal respiratory plasticity and response to augmentation. Limited views of the contralateral common femoral vein are unremarkable. OTHER None. Limitations: none IMPRESSION: Negative. Electronically Signed   By: Malachy Moan M.D.   On: 04/12/2023 11:04    Pending Labs Unresulted Labs (From admission, onward)    None       Vitals/Pain Today's Vitals   04/12/23 1600 04/12/23 1630 04/12/23 1655 04/12/23 1730  BP:   (!) 153/67 (!) 155/70  Pulse: 68 68 78 71  Resp: 20 19 16 20   Temp:    98.5 F (36.9 C)  TempSrc:    Oral  SpO2: 97% 95% 95% 96%  Weight:      Height:      PainSc:    0-No pain    Isolation Precautions No active isolations  Medications Medications - No data to display  Mobility walks with device        R Recommendations: See Admitting Provider Note  Report given to:

## 2023-04-12 NOTE — ED Notes (Signed)
ED Provider at bedside. 

## 2023-04-12 NOTE — Assessment & Plan Note (Signed)
Follows with Dr. Octaviano Glow at [place].  Recent and persistent rash to bilateral feet for which she was seen in the ED 8/26- there was concern for vasculitic etiology during his history, as against tickborne illness as etiology.  CRP 2.9, ESR was 23.  He was started on a course of steroids, and doxycycline.  He followed up with his rheumatologist, he is still taking the steroids.  Now presenting with MRI findings of multiple punctate acute infarcts, chronic infarcts also. -Continue prednisone 10 mg daily - Doubt need for stress dose steroids at this time.

## 2023-04-12 NOTE — ED Provider Notes (Signed)
Pt signed out by Dr. Deretha Emory pending MRI/MRA.  Films reviewed by me.  I agree with the radiologist.  MRI/MRA: . Punctate acute infarcts in the right temporal lobe, right basal  ganglia, and left frontal white matter. Query central emboli.  2. Mild-to-moderate chronic small vessel ischemic disease with  multiple chronic infarcts as above.  3. Motion degraded head MRA without evidence of a large vessel  occlusion. Possible moderate left A1 and severe distal right A2  stenoses.    Pt does have a hx of afib and is on coumadin.  He's been compliant and denies any missed doses.  INR 3.4.  Pt d/w Dr. Amada Jupiter (neuro) who recommends holding coumadin so he does not convert to a hemorrhagic stroke.  Tx to [facility] as he may need TEE.  Pt d/w Dr. Mariea Clonts (triad) for admission.  CRITICAL CARE Performed by: Jacalyn Lefevre   Total critical care time: 30 minutes  Critical care time was exclusive of separately billable procedures and treating other patients.  Critical care was necessary to treat or prevent imminent or life-threatening deterioration.  Critical care was time spent personally by me on the following activities: development of treatment plan with patient and/or surrogate as well as nursing, discussions with consultants, evaluation of patient's response to treatment, examination of patient, obtaining history from patient or surrogate, ordering and performing treatments and interventions, ordering and review of laboratory studies, ordering and review of radiographic studies, pulse oximetry and re-evaluation of patient's condition.    Jacalyn Lefevre, MD 04/12/23 [phone]

## 2023-04-12 NOTE — ED Notes (Signed)
Pt complains of a feeling "like a pea" in the heel of his right foot and wonders if that is the source of some of his foot numbness.

## 2023-04-12 NOTE — ED Triage Notes (Signed)
Pt c/o increased numbness and decreased movement in RLE.  Pain score 7/10.  Pt is able to move toes a little.  R foot is warm and cap refill is less than 3 seconds.    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   Pt takes warfarin.

## 2023-04-12 NOTE — Assessment & Plan Note (Signed)
Stable.  Quit smoking cigarettes about 10 - 12 years ago. -Resume home regimen

## 2023-04-13 ENCOUNTER — Inpatient Hospital Stay (HOSPITAL_COMMUNITY): Payer: Medicare Other

## 2023-04-13 DIAGNOSIS — M21371 Foot drop, right foot: Secondary | ICD-10-CM

## 2023-04-13 DIAGNOSIS — I998 Other disorder of circulatory system: Secondary | ICD-10-CM

## 2023-04-13 DIAGNOSIS — I6523 Occlusion and stenosis of bilateral carotid arteries: Secondary | ICD-10-CM

## 2023-04-13 DIAGNOSIS — I776 Arteritis, unspecified: Secondary | ICD-10-CM

## 2023-04-13 DIAGNOSIS — I6389 Other cerebral infarction: Secondary | ICD-10-CM | POA: Diagnosis not present

## 2023-04-13 DIAGNOSIS — I639 Cerebral infarction, unspecified: Secondary | ICD-10-CM

## 2023-04-13 DIAGNOSIS — I10 Essential (primary) hypertension: Secondary | ICD-10-CM

## 2023-04-13 DIAGNOSIS — E119 Type 2 diabetes mellitus without complications: Secondary | ICD-10-CM

## 2023-04-13 LAB — GLUCOSE, CAPILLARY: Glucose-Capillary: 138 mg/dL — ABNORMAL HIGH (ref 70–99)

## 2023-04-13 LAB — ECHOCARDIOGRAM COMPLETE
Area-P 1/2: 3.65 cm2
Height: 71 in
S' Lateral: 3.8 cm
Weight: 2560 [oz_av]

## 2023-04-13 LAB — HEMOGLOBIN A1C
Hgb A1c MFr Bld: 6.9 % — ABNORMAL HIGH (ref 4.8–5.6)
Mean Plasma Glucose: 151.33 mg/dL

## 2023-04-13 LAB — LIPID PANEL
Cholesterol: 135 mg/dL (ref 0–200)
HDL: 40 mg/dL — ABNORMAL LOW (ref >40)
LDL Cholesterol: 79 mg/dL (ref 0–99)
Total CHOL/HDL Ratio: 3.4 {ratio}
Triglycerides: 79 mg/dL (ref <150)
VLDL: 16 mg/dL (ref 0–40)

## 2023-04-13 LAB — VAS US ABI WITH/WO TBI
Left ABI: 1.04
Right ABI: 0.99

## 2023-04-13 LAB — PROTIME-INR
INR: 3.1 — ABNORMAL HIGH (ref 0.8–1.2)
Prothrombin Time: 32.3 s — ABNORMAL HIGH (ref 11.4–15.2)

## 2023-04-13 LAB — C-REACTIVE PROTEIN: CRP: 5.7 mg/dL — ABNORMAL HIGH (ref <1.0)

## 2023-04-13 NOTE — Progress Notes (Addendum)
STROKE TEAM PROGRESS NOTE   BRIEF HPI [name] is a 77 y.o. male with history of atrial fibrillation on Coumadin, anxiety and depression, COPD, hypertension, hyperlipidemia, GERD, neuropathy, cutaneous vasculitis, history of bladder cancer presenting with right leg weakness and numbness for 2 days.  He was supratherapeutic on Coumadin with an INR of 3.4 MRI brain with acute infarcts in right temporal lobe right basal ganglia and left frontal lobe, mild to moderate chronic small vessel ischemic disease and multiple chronic infarcts  SIGNIFICANT HOSPITAL EVENTS MRI brain with acute infarcts in right temporal lobe right basal ganglia and left frontal lobe, mild to moderate chronic small vessel ischemic disease and multiple chronic infarcts   INTERIM HISTORY/SUBJECTIVE Patient is laying in the bed in no apparent distress daughter is at the bedside And states his right leg weakness has improved somewhat but still weak He does have right foot drop likely due to common peroneal nerve damage related to strokes-  OBJECTIVE  CBC    Component Value Date/Time   WBC 13.1 (H) 04/12/2023 0939   RBC 4.75 04/12/2023 0939   HGB 15.6 04/12/2023 0948   HCT 46.0 04/12/2023 0948   PLT 414 (H) 04/12/2023 0939   MCV 93.3 04/12/2023 0939   MCH 29.5 04/12/2023 0939   MCHC 31.6 04/12/2023 0939   RDW 13.5 04/12/2023 0939   LYMPHSABS 1.1 04/12/2023 0939   MONOABS 1.2 (H) 04/12/2023 0939   EOSABS 0.1 04/12/2023 0939   BASOSABS 0.1 04/12/2023 0939    BMET    Component Value Date/Time   NA 137 04/12/2023 0948   K 3.7 04/12/2023 0948   CL 98 04/12/2023 0948   CO2 28 04/12/2023 0939   GLUCOSE 112 (H) 04/12/2023 0948   BUN 30 (H) 04/12/2023 0948   CREATININE 1.00 04/12/2023 0948   CREATININE 1.03 02/23/2017 0902   CALCIUM 8.9 04/12/2023 0939   GFRNONAA >60 04/12/2023 0939    IMAGING past 24 hours MR Brain Wo Contrast (neuro protocol)  Result Date: 04/12/2023 CLINICAL DATA:  Neuro  deficit, acute, stroke suspected. Right lower extremity numbness and weakness. EXAM: MRI HEAD WITHOUT CONTRAST MRA HEAD WITHOUT CONTRAST TECHNIQUE: Multiplanar, multi-echo pulse sequences of the brain and surrounding structures were acquired without intravenous contrast. Angiographic images of the Circle of Willis were acquired using MRA technique without intravenous contrast. COMPARISON:  Head CT 04/12/2023 FINDINGS: MRI HEAD FINDINGS Brain: There are punctate acute infarcts in the medial right temporal lobe, right basal ganglia, and left frontal white matter. Chronic microhemorrhages are noted in the right corona radiata and right cerebellar hemisphere. No mass, midline shift, or extra-axial fluid collection is identified. T2 hyperintensities in the cerebral white matter are nonspecific but compatible with mild-to-moderate chronic small vessel ischemic disease. There are small chronic infarcts in the right basal ganglia, right thalamus, and right cerebellar hemisphere. There is mild-to-moderate generalized cerebral atrophy. Vascular: Major intracranial vascular flow voids are preserved. Skull and upper cervical spine: Unremarkable bone marrow signal. Sinuses/Orbits: Bilateral cataract extraction. Paranasal sinuses and mastoid air cells are clear. Other: None. MRA HEAD FINDINGS Anterior circulation: The internal carotid arteries are patent from skull base to carotid termini without evidence of a significant stenosis. ACAs and MCAs are patent without evidence of a proximal branch occlusion or significant M1 or right A1 stenosis. There is a moderate proximal left A1 stenosis. Partial signal loss in proximal M2 vessels bilaterally is felt to be largely artifactual based on source images, however underlying stenosis is not excluded. Artifact is  also present through the A2 segments, although a severe distal right A2 stenosis is questioned. No aneurysm is identified. Posterior circulation: The included portions of the  intracranial vertebral arteries are widely patent to the basilar with the left being dominant. Patent PICA and SCA origins are visualized bilaterally. The basilar artery is widely patent. Posterior communicating arteries are diminutive or absent. The PCAs are patent with distal branch vessel attenuation bilaterally but no evidence of a flow limiting proximal stenosis. No aneurysm is identified. Anatomic variants: None. IMPRESSION: 1. Punctate acute infarcts in the right temporal lobe, right basal ganglia, and left frontal white matter. Query central emboli. 2. 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 3. Motion degraded head MRA without evidence of a large vessel occlusion. Possible moderate left A1 and severe distal right A2 stenoses. Electronically Signed   By: Sebastian Ache M.D.   On: 04/12/2023 16:06   MR ANGIO HEAD WO CONTRAST  Result Date: 04/12/2023 CLINICAL DATA:  Neuro deficit, acute, stroke suspected. Right lower extremity numbness and weakness. EXAM: MRI HEAD WITHOUT CONTRAST MRA HEAD WITHOUT CONTRAST TECHNIQUE: Multiplanar, multi-echo pulse sequences of the brain and surrounding structures were acquired without intravenous contrast. Angiographic images of the Circle of Willis were acquired using MRA technique without intravenous contrast. COMPARISON:  Head CT 04/12/2023 FINDINGS: MRI HEAD FINDINGS Brain: There are punctate acute infarcts in the medial right temporal lobe, right basal ganglia, and left frontal white matter. Chronic microhemorrhages are noted in the right corona radiata and right cerebellar hemisphere. No mass, midline shift, or extra-axial fluid collection is identified. T2 hyperintensities in the cerebral white matter are nonspecific but compatible with mild-to-moderate chronic small vessel ischemic disease. There are small chronic infarcts in the right basal ganglia, right thalamus, and right cerebellar hemisphere. There is  mild-to-moderate generalized cerebral atrophy. Vascular: Major intracranial vascular flow voids are preserved. Skull and upper cervical spine: Unremarkable bone marrow signal. Sinuses/Orbits: Bilateral cataract extraction. Paranasal sinuses and mastoid air cells are clear. Other: None. MRA HEAD FINDINGS Anterior circulation: The internal carotid arteries are patent from skull base to carotid termini without evidence of a significant stenosis. ACAs and MCAs are patent without evidence of a proximal branch occlusion or significant M1 or right A1 stenosis. There is a moderate proximal left A1 stenosis. Partial signal loss in proximal M2 vessels bilaterally is felt to be largely artifactual based on source images, however underlying stenosis is not excluded. Artifact is also present through the A2 segments, although a severe distal right A2 stenosis is questioned. No aneurysm is identified. Posterior circulation: The included portions of the intracranial vertebral arteries are widely patent to the basilar with the left being dominant. Patent PICA and SCA origins are visualized bilaterally. The basilar artery is widely patent. Posterior communicating arteries are diminutive or absent. The PCAs are patent with distal branch vessel attenuation bilaterally but no evidence of a flow limiting proximal stenosis. No aneurysm is identified. Anatomic variants: None. IMPRESSION: 1. Punctate acute infarcts in the right temporal lobe, right basal ganglia, and left frontal white matter. Query central emboli. 2. 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 3. Motion degraded head MRA without evidence of a large vessel occlusion. Possible moderate left A1 and severe distal right A2 stenoses. Electronically Signed   By: Sebastian Ache M.D.   On: 04/12/2023 16:06   CT HEAD WO CONTRAST  Result Date: 04/12/2023 CLINICAL DATA:  Neuro deficit, acute, stroke suspected New right  foot drop  EXAM: CT HEAD WITHOUT CONTRAST TECHNIQUE: Contiguous axial images were obtained from the base of the skull through the vertex without intravenous contrast. RADIATION DOSE REDUCTION: This exam was performed according to the departmental dose-optimization program which includes automated exposure control, adjustment of the mA and/or kV according to patient size and/or use of iterative reconstruction technique. COMPARISON:  CT Head 07/24/15 FINDINGS: Brain: No hemorrhage. No hydrocephalus. No extra-axial fluid collection. No CT evidence of an acute cortical infarct. There is sequela of mild chronic microvascular ischemic change. No mass effect. No mass lesion Vascular: No hyperdense vessel or unexpected calcification. Skull: Normal. Negative for fracture or focal lesion. Sinuses/Orbits: No middle ear or mastoid effusion. Paranasal sinuses are clear. Bilateral lens replacement. Orbits are otherwise unremarkable. Other: None. IMPRESSION: No hemorrhage or CT evidence of an acute cortical infarct. If there is persistent clinical concern for acute infarct, consider further evaluation with brain MRI. Electronically Signed   By: Lorenza Cambridge M.D.   On: 04/12/2023 12:08   US Venous Img Lower Right (DVT Study)  Result Date: 04/12/2023 CLINICAL DATA:  Right lower extremity pain and swelling EXAM: RIGHT LOWER EXTREMITY VENOUS DOPPLER ULTRASOUND TECHNIQUE: Gray-scale sonography with compression, as well as color and duplex ultrasound, were performed to evaluate the deep venous system(s) from the level of the common femoral vein through the popliteal and proximal calf veins. COMPARISON:  None Available. FINDINGS: VENOUS Normal compressibility of the common femoral, superficial femoral, and popliteal veins, as well as the visualized calf veins. Visualized portions of profunda femoral vein and great saphenous vein unremarkable. No filling defects to suggest DVT on grayscale or color Doppler imaging. Doppler waveforms show normal  direction of venous flow, normal respiratory plasticity and response to augmentation. Limited views of the contralateral common femoral vein are unremarkable. OTHER None. Limitations: none IMPRESSION: Negative. Electronically Signed   By: Malachy Moan M.D.   On: 04/12/2023 11:04    Vitals:   04/12/23 2052 04/12/23 2148 04/12/23 2326 04/13/23 0343  BP:  (!) 147/86 110/60 (!) 104/58  Pulse: 79 82 76 74  Resp: (!) 23 18 18 18   Temp:  98.6 F (37 C) 97.8 F (36.6 C) 98.5 F (36.9 C)  TempSrc:  Oral Oral Oral  SpO2: 93% 98% 98% (!) 88%  Weight:      Height:         PHYSICAL EXAM General:  Alert, well-nourished, well-developed patient in no acute distress Psych:  Mood and affect appropriate for situation CV: Regular rate and rhythm on monitor Respiratory:  Regular, unlabored respirations on room air GI: Abdomen soft and nontender   NEURO:  Mental Status: AA&Ox3, patient is able to give clear and coherent history Speech/Language: speech is without dysarthria or aphasia.  Naming, repetition, fluency, and comprehension intact.  Cranial Nerves:  II: PERRL. Visual fields full.  III, IV, VI: EOMI. Eyelids elevate symmetrically.  V: Sensation is intact to light touch and symmetrical to face.  VII: Face is symmetrical resting and smiling VIII: hearing intact to voice. IX, X: Palate elevates symmetrically. Phonation is normal.  QM:VHQIONGE shrug 5/5. XII: tongue is midline without fasciculations. Motor: 5/5 strength to all muscle groups tested.  Right foot with weak dorsiflexion/extension Tone: is normal and bulk is normal Sensation-decreased on right foot extinction absent to light touch to DSS.   Coordination: FTN intact bilaterally, HKS: no ataxia in BLE.No drift.  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 Footdrop strap  Recommend outpt EMG study  Acute Ischemic Infarct, likely incidental finding:  right temporal and basal ganglia, left frontal punctate ischemic strokes, likely cardioembolic from afib on coumadin although INR supratherapeutic on admission CT head No acute abnormality.    MRI   Punctate acute infarcts in the right temporal lobe, right basal ganglia, and left frontal white matter. Mild-to-moderate chronic small vessel ischemic disease with multiple chronic infarcts  MRA moderate left A1 and severe distal right A2 stenoses. Carotid Doppler unremarkable 2D Echo EF 40-45% LDL 79 HgbA1c 6.9 UDS + benzo (home meds) VTE prophylaxis -  SCD's warfarin daily prior to admission, now on No antithrombotic given INR 3.4->3.1.  Consider switching Coumadin to Eliquis once INR < 2.0. pharmacy consulted Therapy recommendations: CIR Disposition:  pending  Atrial fibrillation Home Meds: Coumadin Continue telemetry monitoring INR 3.4->3.1 Coumadin being held.   Consider switching to Eliquis once INR < 2.0. 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   Other Stroke Risk Factors  ETOH use, alcohol level <10, advised to drink no more than 2 drink(s) a day Former smoker  Other Active  Problems Leukocytosis, WBC 13.1  Hospital day # 1  Gevena Mart DNP, ACNPC-AG  [facility]  ATTENDING NOTE: I reviewed above note and agree with the assessment and plan. Pt was seen and examined.   Daughter at bedside.  Patient lying bed, complaining of right foot drop and right leg numbness for the last 2 days.  On examination, patient has right foot drop and mainly lateral dorsal foot decreased sensation, possible fluid and medial dorsal foot sensation preserved.  Patient stated that he has bilateral lower extremity vasculitis flareup recently and also he was working on his knees for extended time on a hard surface 1 to 2 weeks ago, exam and description consistent with possible common peroneal nerve injury.  PT and OT evaluation and recommend outpatient EMG study.  Patient MRI did not show 3-4 punctate bilateral infarcts, concerning for cardioembolic source in the setting of chronic A-fib, although INR 3.4 on admission.  Patient is willing to switch to DOAC.  Pharmacy consulted, once INR lower than 2.0, will start Eliquis.  Continue prednisone for vasculitis treatment.  No statin recommended at this time.  PT and OT recommend CIR.  For detailed assessment and plan, please refer to above/below as I have made changes wherever appropriate.   Neurology will sign off. Please call with questions. Pt will follow up with Dr. Terrace Arabia at [facility] in about 4 weeks. Thanks for the consult.   Marvel Plan, MD PhD Stroke Neurology 04/13/2023 4:59 PM  I discussed with Dr. Lowell Guitar. I spent  extensive face-to-face time with the patient and  his daughter, more than 50% of which was spent in counseling and coordination of care, reviewing test results, images and medication, and discussing the diagnosis, treatment plan and potential prognosis. This patient's care requiresreview of multiple databases, neurological assessment, discussion with family, other specialists and medical decision making of high  complexity.      To contact Stroke Continuity provider, please refer to [email or web address]. After hours, contact General Neurology

## 2023-04-13 NOTE — Progress Notes (Signed)
PHARMACY - ANTICOAGULATION CONSULT NOTE  Pharmacy Consult for apixaban Indication: atrial fibrillation and stroke  Allergies  Allergen Reactions   Simvastatin Rash   Albuterol Palpitations   Augmentin [Amoxicillin-Pot Clavulanate] Rash   Ciprofloxacin Rash   Contrast Media [Iodinated Contrast Media] Rash   Flagyl [Metronidazole] Rash   Latex Hives and Rash   Sulfa Antibiotics Rash    Patient Measurements: Height: 5\' 11"  (180.3 cm) Weight: 72.6 kg (160 lb) IBW/kg (Calculated) : 75.3 Heparin Dosing Weight:   Vital Signs: Temp: 98 F (36.7 C) (10/04 1233) Temp Source: Oral (10/04 1233) BP: 95/59 (10/04 1233) Pulse Rate: 85 (10/04 1233)  Labs: Recent Labs    04/12/23 0939 04/12/23 0948 04/13/23 0713  HGB 14.0 15.6  --   HCT 44.3 46.0  --   PLT 414*  --   --   APTT 40*  --   --   LABPROT 34.7*  --  32.3*  INR 3.4*  --  3.1*  CREATININE 1.01 1.00  --     Estimated Creatinine Clearance: 62.5 mL/min (by C-G formula based on SCr of 1 mg/dL).   Medical History: Past Medical History:  Diagnosis Date   Anxiety    Aortic atherosclerosis (HCC)    Atrial fibrillation (HCC)    Basal cell carcinoma    Bladder cancer (HCC)    Bladder tumor    Colon polyps    COPD (chronic obstructive pulmonary disease) (HCC)    Cutaneous vasculitis    Diabetic peripheral neuropathy (HCC) 12/27/2020   Diverticulosis    Essential hypertension    GERD (gastroesophageal reflux disease)    History of pneumonia    Nephrolithiasis    Osteoarthritis     Medications:  Medications Prior to Admission  Medication Sig Dispense Refill Last Dose   Acetaminophen (TYLENOL) 325 MG CAPS Take 1 capsule by mouth in the morning and at bedtime.   04/12/2023   ALPRAZolam (XANAX) 0.25 MG tablet Take 0.25 mg by mouth 2 (two) times daily as needed for anxiety or sleep.   3 04/12/2023   clotrimazole-betamethasone (LOTRISONE) cream Apply 1 Application topically 2 (two) times daily.   04/11/2023    Cyanocobalamin (VITAMIN B12 PO) Take 1 tablet by mouth daily.   04/11/2023   D3-50 50000 units capsule Take 50,000 Units by mouth once a week. Usually Tuesday or Wednesday.  0 04/10/2023   ELDERBERRY PO Take 1 tablet by mouth daily.   Past Month   esomeprazole (NEXIUM) 40 MG capsule Take 40 mg by mouth daily at 12 noon.   04/12/2023   levalbuterol (XOPENEX) 0.63 MG/3ML nebulizer solution Take 3 mLs (0.63 mg total) by nebulization every 6 (six) hours as needed for wheezing or shortness of breath. 75 mL 6 04/11/2023   losartan-hydrochlorothiazide (HYZAAR) 50-12.5 MG tablet Take 1 tablet by mouth daily. Pt takes 1/2 tablet daily   04/12/2023   metoprolol succinate (TOPROL XL) 25 MG 24 hr tablet Take 1 tablet (25 mg total) by mouth 2 (two) times daily. 180 tablet 2 04/12/2023   neomycin-polymyxin b-dexamethasone (MAXITROL) 3.5-10000-0.1 OINT 1 application at bedtime.   Past Month   predniSONE (DELTASONE) 5 MG tablet Take 10 mg by mouth 2 (two) times daily with a meal.   04/12/2023   triamcinolone (NASACORT) 55 MCG/ACT AERO nasal inhaler Place 1 spray into the nose daily as needed (Congestion).    Past Week   warfarin (COUMADIN) 2.5 MG tablet Take 2.5 mg by mouth daily.   04/11/2023 at 2200  zinc gluconate 50 MG tablet Take 50 mg by mouth 2 (two) times a week.   04/10/2023   doxycycline (VIBRAMYCIN) 100 MG capsule Take 1 capsule (100 mg total) by mouth 2 (two) times daily. 20 capsule 0 past month   Scheduled:   ALPRAZolam  0.25 mg Oral BID   metoprolol succinate  25 mg Oral BID   pantoprazole  40 mg Oral Daily   predniSONE  10 mg Oral BID WC   Infusions:   Assessment: Pt has been on coumadin for his AF. He presented with new CVA despite therapeutic INR. Plan to transition to apixaban once INR is <2.  Cbc stable Goal of Therapy:  Monitor platelets by anticoagulation protocol: Yes   Plan:  Daily INR Apixaban once INR<2  Ulyses Southward, PharmD, BCIDP, AAHIVP, CPP Infectious Disease Pharmacist 04/13/2023  5:38 PM

## 2023-04-13 NOTE — Evaluation (Addendum)
Occupational Therapy Evaluation Patient Details Name: Matthew Hall MRN: [id number] DOB: [birth year] Today's Date: 04/13/2023   History of Present Illness Pt is a 77 y/o male who presents 04/12/2023 to [facility] for RLE weakness, numbness, pain. 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 PMH significant for a-fib, COPD, HTN, vasculitis, DM, diabetic neuropathy, bladder CA.   Clinical Impression   PTA pt lives at home independently and provides total care for his wife. Pt demonstrates a decline in functional status requiring min A for ADL and mobility due to below listed deficits. If R footdrop continues, recommend AFO from [facility]. Will attempt use of footdrop strap at this time. Patient will benefit from intensive inpatient follow up therapy, >3 hours/day to maximize independence to facilitate safe DC home. Acute OT to follow.       If plan is discharge home, recommend the following: A little help with bathing/dressing/bathroom;Assistance with cooking/housework;Assist for transportation;A little help with walking and/or transfers    Functional Status Assessment  Patient has had a recent decline in their functional status and demonstrates the ability to make significant improvements in function in a reasonable and predictable amount of time.  Equipment Recommendations  None recommended by OT    Recommendations for Other Services Rehab consult     Precautions / Restrictions Precautions Precautions: Fall Precaution Comments: Hypersensitivity in lower legs and feet R>L Restrictions Weight Bearing Restrictions: No      Mobility Bed Mobility Overal bed mobility: Needs Assistance Bed Mobility: Supine to Sit, Sit to Supine     Supine to sit: Supervision Sit to supine: Supervision   General bed mobility comments: HOB slightly elevated and use of rails required.    Transfers Overall transfer  level: Needs assistance Equipment used: Rolling walker (2 wheels) Transfers: Sit to/from Stand Sit to Stand: Min assist                  Balance Overall balance assessment: Needs assistance Sitting-balance support: Feet supported, Single extremity supported Sitting balance-Leahy Scale: Good       Standing balance-Leahy Scale: Fair                             ADL either performed or assessed with clinical judgement   ADL Overall ADL's : Needs assistance/impaired Eating/Feeding: Independent   Grooming: Set up   Upper Body Bathing: Set up;Sitting   Lower Body Bathing: Contact guard assist;Sit to/from stand   Upper Body Dressing : Set up;Sitting   Lower Body Dressing: Contact guard assist;Sit to/from stand   Toilet Transfer: Minimal assistance;Ambulation;Rolling walker (2 wheels)   Toileting- Clothing Manipulation and Hygiene: Supervision/safety       Functional mobility during ADLs: Minimal assistance;Rolling walker (2 wheels)       Vision Baseline Vision/History: 0 No visual deficits;1 Wears glasses       Perception Perception: Within Functional Limits       Praxis         Pertinent Vitals/Pain Pain Assessment Pain Assessment: 0-10 Pain Score: 6  Faces Pain Scale: Hurts even more Pain Location: lower legs and feet R>L Pain Descriptors / Indicators: Burning Pain Intervention(s): Limited activity within patient's tolerance     Extremity/Trunk Assessment Upper Extremity Assessment Upper Extremity Assessment: Overall WFL for tasks assessed   Lower Extremity Assessment Lower Extremity Assessment: Defer to PT evaluation RLE Deficits / Details:  R foot drop RLE Sensation: decreased light touch LLE Deficits / Details: Pain from knee to toes, less than R   Cervical / Trunk Assessment Cervical / Trunk Assessment: Normal   Communication Communication Communication: No apparent difficulties Cueing Techniques: Verbal cues;Gestural cues    Cognition Arousal: Alert Behavior During Therapy: WFL for tasks assessed/performed Overall Cognitive Status: No family/caregiver present to determine baseline cognitive functioning                                 General Comments: tangential at times; overall WFL for ADL tasks; will further assess     General Comments  lesions present. "I just feel weak"; normally doing home repairs; walks without RW    Exercises     Shoulder Instructions      Home Living Family/patient expects to be discharged to:: Private residence Living Arrangements: Spouse/significant other Available Help at Discharge: Family;Available PRN/intermittently Type of Home: House Home Access: Stairs to enter Entrance Stairs-Number of Steps: 1   Home Layout: Two level;Able to live on main level with bedroom/bathroom Alternate Level Stairs-Number of Steps: flight   Bathroom Shower/Tub: Chief Strategy Officer: Standard Bathroom Accessibility: Yes How Accessible: Accessible via walker Home Equipment: Tub bench;Wheelchair Financial trader (4 wheels);Rolling Walker (2 wheels);Hand held shower head;Adaptive equipment Adaptive Equipment: Reacher        Prior Functioning/Environment Prior Level of Function : Independent/Modified Independent             Mobility Comments: Primary caretaker for wife who pt states is "bedridden and disabled". He was assisting with transfers, bathing, dressing, toileting, and was doing all of the cooking/cleaning at home.          OT Problem List: Decreased strength;Decreased activity tolerance;Impaired balance (sitting and/or standing);Decreased safety awareness;Impaired sensation;Pain      OT Treatment/Interventions: Self-care/ADL training;DME and/or AE instruction;Therapeutic activities;Patient/family education;Balance training    OT Goals(Current goals can be found in the care plan section) Acute Rehab OT Goals Patient Stated Goal: to get  better and go home OT Goal Formulation: With patient Time For Goal Achievement: 04/27/23 Potential to Achieve Goals: Good  OT Frequency: Min 1X/week    Co-evaluation              AM-PAC OT "6 Clicks" Daily Activity     Outcome Measure Help from another person eating meals?: None Help from another person taking care of personal grooming?: A Little Help from another person toileting, which includes using toliet, bedpan, or urinal?: A Little Help from another person bathing (including washing, rinsing, drying)?: A Little Help from another person to put on and taking off regular upper body clothing?: A Little Help from another person to put on and taking off regular lower body clothing?: A Little 6 Click Score: 19   End of Session Equipment Utilized During Treatment: Rolling walker (2 wheels);Gait belt Nurse Communication: Mobility status  Activity Tolerance: Patient tolerated treatment well Patient left: in bed;with call bell/phone within reach;with bed alarm set  OT Visit Diagnosis: Unsteadiness on feet (R26.81);Muscle weakness (generalized) (M62.81);Pain Pain - Right/Left:  (B) Pain - part of body:  (feet)                Time: 1610-9604 OT Time Calculation (min): 15 min Charges:  OT General Charges $OT Visit: 1 Visit OT Evaluation $OT Eval Moderate Complexity: 1 Mod  Miranda Frese, OT/L   Acute OT Clinical Specialist Acute  Rehabilitation Services Pager [phone] Office [phone]   [facility] 04/13/2023, 2:25 PM

## 2023-04-13 NOTE — Progress Notes (Signed)
Ankle-brachial index completed. Please see CV Procedures for preliminary results.  Shona Simpson, RVT 04/13/23 2:21 PM

## 2023-04-13 NOTE — Evaluation (Signed)
Physical Therapy Evaluation  Patient Details Name: Matthew Hall MRN: [id number] DOB: September 01, 1945 Today's Date: 04/13/2023  History of Present Illness  Pt is a 77 y/o male who presents 04/12/2023 to [facility] for RLE weakness, numbness, pain. 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 PMH significant for a-fib, COPD, HTN, vasculitis, DM, diabetic neuropathy, bladder CA.   Clinical Impression  Pt admitted with above diagnosis. Pt currently with functional limitations due to the deficits listed below (see PT Problem List). At the time of PT eval pt was able to perform transfers and ambulation with up to min assist with RW for balance support, safety, and pain control. Recommending post-acute rehab >3 hours/day at d/c. Acutely, pt will benefit from acute skilled PT to increase their independence and safety with mobility to allow discharge.           If plan is discharge home, recommend the following: A little help with walking and/or transfers;A little help with bathing/dressing/bathroom;Assistance with cooking/housework;Assist for transportation;Help with stairs or ramp for entrance   Can travel by private vehicle        Equipment Recommendations None recommended by PT  Recommendations for Other Services       Functional Status Assessment Patient has had a recent decline in their functional status and demonstrates the ability to make significant improvements in function in a reasonable and predictable amount of time.     Precautions / Restrictions Precautions Precautions: Fall Precaution Comments: Hypersensitivity in lower legs and feet R>L Restrictions Weight Bearing Restrictions: No      Mobility  Bed Mobility Overal bed mobility: Needs Assistance Bed Mobility: Supine to Sit, Sit to Supine     Supine to sit: Supervision Sit to supine: Supervision   General bed mobility comments: HOB  slightly elevated and use of rails required.    Transfers Overall transfer level: Needs assistance Equipment used: Rolling walker (2 wheels) Transfers: Sit to/from Stand Sit to Stand: Min assist           General transfer comment: VC's for hand placement on seated surface for safety. Min assist for power up.    Ambulation/Gait Ambulation/Gait assistance: Contact guard assist, Min assist Gait Distance (Feet): 70 Feet Assistive device: Rolling walker (2 wheels) Gait Pattern/deviations: Step-through pattern, Decreased stride length, Trunk flexed Gait velocity: Decreased Gait velocity interpretation: 1.31 - 2.62 ft/sec, indicative of limited community ambulator   General Gait Details: VC's for improved posture, closer walker proximity, and forward gaze. Assist intermittently for walker management.  Stairs            Wheelchair Mobility     Tilt Bed    Modified Rankin (Stroke Patients Only)       Balance Overall balance assessment: Needs assistance Sitting-balance support: Feet supported, No upper extremity supported       Standing balance support: No upper extremity supported                                 Pertinent Vitals/Pain Pain Assessment Pain Assessment: Faces Faces Pain Scale: Hurts even more Pain Location: lower legs and feet R>L Pain Descriptors / Indicators: Burning Pain Intervention(s): Limited activity within patient's tolerance, Monitored during session, Repositioned    Home Living Family/patient expects to be discharged to:: Private residence Living Arrangements: Spouse/significant other Available Help at Discharge: Family;Available PRN/intermittently Type of Home:  House Home Access: Stairs to enter   Entrance Stairs-Number of Steps: 1 Alternate Level Stairs-Number of Steps: flight Home Layout: Two level;Able to live on main level with bedroom/bathroom Home Equipment: Tub bench;Wheelchair Financial trader (4  wheels);Rolling Walker (2 wheels);Hand held shower head;Adaptive equipment      Prior Function Prior Level of Function : Independent/Modified Independent             Mobility Comments: Primary caretaker for wife who pt states is "bedridden and disabled". He was assisting with transfers, bathing, dressing, toileting, and was doing all of the cooking/cleaning at home.       Extremity/Trunk Assessment   Upper Extremity Assessment Upper Extremity Assessment: Defer to OT evaluation    Lower Extremity Assessment Lower Extremity Assessment: RLE deficits/detail;LLE deficits/detail RLE Deficits / Details: Pt reports increased pain compared to L from knee to toes. When ambulating pt reports it feels like he has a pea under his heel and it is painful to bear weight through it. Pt with slightly decreased strength on the R in quads, hamstrings, hip flexors - grossly 4-/5. RLE Sensation: decreased light touch LLE Deficits / Details: Pain from knee to toes, less than R    Cervical / Trunk Assessment Cervical / Trunk Assessment: Normal  Communication   Communication Communication: No apparent difficulties Cueing Techniques: Verbal cues;Gestural cues  Cognition Arousal: Alert Behavior During Therapy: WFL for tasks assessed/performed Overall Cognitive Status: Within Functional Limits for tasks assessed                                          General Comments      Exercises     Assessment/Plan    PT Assessment Patient needs continued PT services  PT Problem List Decreased strength;Decreased range of motion;Decreased activity tolerance;Decreased balance;Decreased mobility;Decreased knowledge of use of DME;Decreased safety awareness;Decreased knowledge of precautions;Pain       PT Treatment Interventions DME instruction;Gait training;Stair training;Therapeutic activities;Functional mobility training;Therapeutic exercise;Balance training;Patient/family education     PT Goals (Current goals can be found in the Care Plan section)  Acute Rehab PT Goals Patient Stated Goal: Be able to return home PT Goal Formulation: With patient Time For Goal Achievement: 04/27/23 Potential to Achieve Goals: Good    Frequency Min 1X/week     Co-evaluation               AM-PAC PT "6 Clicks" Mobility  Outcome Measure Help needed turning from your back to your side while in a flat bed without using bedrails?: A Little Help needed moving from lying on your back to sitting on the side of a flat bed without using bedrails?: A Little Help needed moving to and from a bed to a chair (including a wheelchair)?: A Little Help needed standing up from a chair using your arms (e.g., wheelchair or bedside chair)?: A Little Help needed to walk in hospital room?: A Little Help needed climbing 3-5 steps with a railing? : A Little 6 Click Score: 18    End of Session Equipment Utilized During Treatment: Gait belt Activity Tolerance: Patient tolerated treatment well Patient left: in bed Nurse Communication: Mobility status PT Visit Diagnosis: Unsteadiness on feet (R26.81);Pain Pain - Right/Left: Right (bilateral) Pain - part of body: Leg;Ankle and joints of foot    Time: 2841-3244 PT Time Calculation (min) (ACUTE ONLY): 29 min   Charges:   PT Evaluation $  PT Eval Moderate Complexity: 1 Mod PT Treatments $Gait Training: 8-22 mins PT General Charges $$ ACUTE PT VISIT: 1 Visit         Conni Slipper, PT, DPT Acute Rehabilitation Services Secure Chat Preferred Office: [phone]   [name] 04/13/2023, 1:15 PM

## 2023-04-13 NOTE — Progress Notes (Signed)
Carotid arterial duplex completed. Please see CV Procedures for preliminary results.  Shona Simpson, RVT 04/13/23 2:20 PM

## 2023-04-13 NOTE — Progress Notes (Addendum)
Labs  Lab 04/12/23 0939 04/12/23 0948  WBC 13.1*  --   NEUTROABS 10.4*  --   HGB 14.0 15.6  HCT 44.3 46.0  MCV 93.3  --   PLT 414*  --     Basic Metabolic Panel: Recent Labs  Lab 04/12/23 0939 04/12/23 0948  NA 135 137  K 3.5 3.7  CL 96* 98  CO2 28  --   GLUCOSE 111* 112*  BUN 27* 30*  CREATININE 1.01 1.00  CALCIUM 8.9  --     GFR: Estimated Creatinine Clearance: 62.5 mL/min (by C-G formula based on SCr of 1 mg/dL).  Liver Function Tests: Recent Labs  Lab 04/12/23 0939  AST 15  ALT 21  ALKPHOS 48  BILITOT 0.6  PROT 7.0  ALBUMIN 3.0*    CBG: No results for input(s): "GLUCAP" in the last 168 hours.   Recent Results (from the past 240 hour(s))  Culture, blood (Routine X 2) w Reflex to ID Panel     Status: None (Preliminary  result)   Collection Time: 04/12/23  7:37 PM   Specimen: Right Antecubital; Blood  Result Value Ref Range Status   Specimen Description RIGHT ANTECUBITAL  Final   Special Requests   Final    BOTTLES DRAWN AEROBIC AND ANAEROBIC Blood Culture results may not be optimal due to an excessive volume of blood received in culture bottles   Culture   Final    NO GROWTH < 12 HOURS Performed at [facility], [address] [place], [place] [address]    Report Status PENDING  Incomplete  Culture, blood (Routine X 2) w Reflex to ID Panel     Status: None (Preliminary result)   Collection Time: 04/12/23  7:48 PM   Specimen: BLOOD RIGHT FOREARM  Result Value Ref Range Status   Specimen Description BLOOD RIGHT FOREARM  Final   Special Requests   Final    BOTTLES DRAWN AEROBIC AND ANAEROBIC Blood Culture adequate volume   Culture   Final    NO GROWTH < 12 HOURS Performed at [facility], [address], [place], [place] [address]    Report Status PENDING  Incomplete         Radiology Studies: VAS [place] ABI WITH/WO TBI  Result Date: 04/13/2023  LOWER EXTREMITY DOPPLER STUDY Patient Name:  Matthew Hall  Date of Exam:   04/13/2023 Medical Rec #: [id number]          Accession #:    [id number] Date of Birth: June 21, 1946         Patient Gender: M Patient Age:   71 years Exam Location:  [facility] Procedure:      VAS [place] ABI WITH/WO TBI Referring Phys: Kyriaki Moder POWELL JR --------------------------------------------------------------------------------  Indications: Diminished pulses High Risk Factors: Hypertension, Diabetes, prior CVA.  Vascular Interventions: Vasculitis. Performing Technologist: Matthew Hall  Examination Guidelines: Matthew Hall complete evaluation includes at minimum, Doppler waveform signals and systolic blood pressure reading at the level of bilateral brachial, anterior tibial, and posterior tibial arteries, when vessel segments are accessible. Bilateral testing is considered an integral part of Matthew Hall  complete examination. Photoelectric Plethysmograph (PPG) waveforms and toe systolic pressure readings are included as required and additional duplex testing as needed. Limited examinations for reoccurring indications may be performed as noted.  ABI Findings: +---------+------------------+-----+-------------------+--------+ Right    Rt Pressure (mmHg)IndexWaveform           Comment  +---------+------------------+-----+-------------------+--------+ Brachial 111  calcific                             +----------+--------+--------+--------+------------------+------------------+ ICA Mid   106     26                                                    +----------+--------+--------+--------+------------------+------------------+ ICA Distal121     30                                                   +----------+--------+--------+--------+------------------+------------------+ ECA       125     10                                                   +----------+--------+--------+--------+------------------+------------------+ +----------+--------+--------+--------+-------------------+           PSV cm/sEDV cm/sDescribeArm Pressure (mmHG) +----------+--------+--------+--------+-------------------+ [id number]     0                                   +----------+--------+--------+--------+-------------------+ +---------+--------+--+--------+--+ VertebralPSV cm/s75EDV cm/s15 +---------+--------+--+--------+--+   Summary: Right Carotid: Velocities in the right ICA are consistent with Matthew Hall 1-39% stenosis. Left Carotid: Velocities in the left ICA are consistent with Matthew Hall 1-39% stenosis. Vertebrals:  Bilateral vertebral arteries demonstrate antegrade flow. Subclavians: Normal flow hemodynamics were seen in bilateral subclavian              arteries. *See table(s) above for measurements and observations.  Electronically signed by Matthew Hall on 04/13/2023 at 6:45:35 PM.    Final    ECHOCARDIOGRAM COMPLETE  Result Date: 04/13/2023    ECHOCARDIOGRAM REPORT   Patient Name:   Matthew Hall Date of Exam: 04/13/2023 Medical Rec #:  [id number]         Height:       71.0 in Accession #:    [id number]        Weight:       160.0 lb Date of Birth:  1945-07-15        BSA:          1.918 m Patient Age:    76 years          BP:           95/59 mmHg Patient Gender: M                 HR:           82 bpm. Exam Location:  Inpatient Procedure: 2D Echo, Cardiac Doppler and Color Doppler Indications:    stroke  History:        Patient has no prior history of Echocardiogram examinations.                 Stroke and COPD, Arrythmias:Atrial Fibrillation; Risk                  Factors:Hypertension.  Sonographer:    Matthew Hall RDCS (AE, PE) Referring  PROGRESS NOTE    Matthew Hall  WUJ:[id number] DOB: 07/08/1946 DOA: 04/12/2023 PCP: Alinda Deem, MD  Chief Complaint  Patient presents with   Numbness   Leg Pain    Brief Narrative:    Matthew Hall is Matthew Hall 77 y.o. male with medical history significant for atrial fibrillation, COPD, hypertension, vasculitis, bladder cancer, diabetic neuropathy. Presented to the ED with complaints of increased numbness, and weakness of his right lower extremity.  Reports he has been dragging his right lower extremity for about 2 days.  He has also had to use his wife's rollator to help with ambulation over the past 2 days.  He reports some baseline neuropathy, he denies weakness of his other extremities.  Daughter Matthew Sheldon is at bedside, patient has not had any facial asymmetry, no slurred speech, no change in vision, no confusion.     Patient was in the ED 8/26, with Matthew Hall rash to his bilateral feet, was discharged on doxycycline for possible tickborne illness, but also prednisone considering his history of vasculitis, which was suspected so as etiology.  Followed up with his rheumatologist at [facility] as recommended -he completed the doxycycline course, he is still taking the prednisone.   ED Course: Blood pressure systolic 102-150s.  INR 3.4 WBC 13.1. UDS positive for benzos, UA clean.  Right lower extremity venous Dopplers negative for DVT. CT negative for acute abnormality Brain MRI-punctate acute infarcts in the right temporal lobe, right basal ganglia, and left frontal white matter.  Query central emboli.  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 MRI was motion degraded but without evidence of large vessel occlusion. EDP asked to neurologist Dr. Zenaida Deed admission to [facility] need TEE.  Assessment & Plan:   Principal Problem:   Acute CVA (cerebrovascular accident) [facility]) Active Problems:   Atrial fibrillation, chronic (HCC)    Lymphocytic vasculitis (HCC)   Essential hypertension   Paresthesia   Anxiety   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)  Acute CVA (cerebrovascular accident) [facility]) Appreciate neurology assistance - this is likely incidental - > planning for transition coumadin to eliquis once INR <2 (no statin due side effect) MRI brain with punctate acute infarcts in R temporal lobe, right basal ganglia and left frontal white matter (query central emboli) - mild to moderate chronic small vessel ischemic disease with multiple chronic infarcts - motion degraded MRA wihtout evidence of LVO, possible moderate L A1 and severe distal R A2 stenoses Carotid US with 1-39% stenosis bilaterally, antegrade flow in bilateral vertebral arteries Echo with EF 40-45% PT/OT/SLP -> inpatient rehab  LDL 79, A1c 6.9  Neurology follow up with Dr. Terrace Arabia in 4 weeks  Right Foot Drop Appreciate neurology assistance -suspects common peroneal nerve injury  AFO vs footdrop strap Needs outpatient EMG   Lymphocytic vasculitis (HCC) Follows with Dr. Octaviano Glow at [place].  Recent and persistent rash to bilateral feet for which she was seen in the ED 8/26- there was concern for vasculitic etiology during his history, as against tickborne illness as etiology.  CRP 2.9, ESR was 23.  He was started on Maryanne Huneycutt course of steroids, and doxycycline.  He followed up with his rheumatologist, he is still taking the steroids.   -Continue prednisone 10 mg daily - sed rate 38, CRP 5.7 -follow outpatient with rheumatology   Atrial fibrillation, chronic (HCC) EKG currently showing atrial fibrillation.   INR 3.4 at presentation -Resume home 25 mg metoprolol for rate control -planning to transition to eliquis once INR <2  -  triphasic                   +---------+------------------+-----+-------------------+--------+ PTA      98                0.88 dampened monophasic         +---------+------------------+-----+-------------------+--------+ DP       110               0.99 biphasic                    +---------+------------------+-----+-------------------+--------+ Great Toe66                0.59 Normal                      +---------+------------------+-----+-------------------+--------+ +---------+------------------+-----+---------+-------+ Left     Lt Pressure (mmHg)IndexWaveform Comment +---------+------------------+-----+---------+-------+ Brachial 109                    triphasic        +---------+------------------+-----+---------+-------+ PTA      115               1.04 triphasic        +---------+------------------+-----+---------+-------+ DP       107               0.96 biphasic         +---------+------------------+-----+---------+-------+ Great Toe0                 0.00 Absent           +---------+------------------+-----+---------+-------+ +-------+-----------+-----------+------------+------------+ ABI/TBIToday's ABIToday's TBIPrevious ABIPrevious TBI +-------+-----------+-----------+------------+------------+ Right  0.99       0.59                                +-------+-----------+-----------+------------+------------+ Left   1.04       0                                    +-------+-----------+-----------+------------+------------+  Summary: Right: Resting right ankle-brachial index is within normal range. The right toe-brachial index is abnormal. Left: Resting left ankle-brachial index is within normal range. The left toe-brachial index is abnormal. *See table(s) above for measurements and observations.  Electronically signed by Matthew Hall on 04/13/2023 at 6:49:39 PM.    Final    VAS US CAROTID  Result Date: 04/13/2023 Carotid Arterial Duplex Study Patient Name:  TREYTEN MONESTIME  Date of Exam:   04/13/2023 Medical Rec #: [id number]          Accession #:    [id number] Date of Birth: 1946/04/25         Patient Gender: M Patient Age:   77 years Exam Location:  [facility] Procedure:      VAS US CAROTID Referring Phys: Kennis Carina --------------------------------------------------------------------------------  Indications:   CVA. Risk Factors:  Hypertension, Diabetes, prior CVA. Other Factors: Vasculitis. Performing Technologist: Matthew Hall  Examination Guidelines: Chrisie Jankovich complete evaluation includes B-mode imaging, spectral Doppler, color Doppler, and power Doppler as needed of all accessible portions of each vessel. Bilateral testing is considered an integral part of Maliya Marich complete examination. Limited examinations for reoccurring indications may be performed as noted.  Right Carotid Findings: +----------+--------+--------+--------+---------------------+------------------+           PSV cm/sEDV cm/sStenosisPlaque Description   Comments           +----------+--------+--------+--------+---------------------+------------------+  calcific                             +----------+--------+--------+--------+------------------+------------------+ ICA Mid   106     26                                                    +----------+--------+--------+--------+------------------+------------------+ ICA Distal121     30                                                   +----------+--------+--------+--------+------------------+------------------+ ECA       125     10                                                   +----------+--------+--------+--------+------------------+------------------+ +----------+--------+--------+--------+-------------------+           PSV cm/sEDV cm/sDescribeArm Pressure (mmHG) +----------+--------+--------+--------+-------------------+ [id number]     0                                   +----------+--------+--------+--------+-------------------+ +---------+--------+--+--------+--+ VertebralPSV cm/s75EDV cm/s15 +---------+--------+--+--------+--+   Summary: Right Carotid: Velocities in the right ICA are consistent with Matthew Hall 1-39% stenosis. Left Carotid: Velocities in the left ICA are consistent with Matthew Hall 1-39% stenosis. Vertebrals:  Bilateral vertebral arteries demonstrate antegrade flow. Subclavians: Normal flow hemodynamics were seen in bilateral subclavian              arteries. *See table(s) above for measurements and observations.  Electronically signed by Matthew Hall on 04/13/2023 at 6:45:35 PM.    Final    ECHOCARDIOGRAM COMPLETE  Result Date: 04/13/2023    ECHOCARDIOGRAM REPORT   Patient Name:   Matthew Hall Date of Exam: 04/13/2023 Medical Rec #:  [id number]         Height:       71.0 in Accession #:    [id number]        Weight:       160.0 lb Date of Birth:  1945-07-15        BSA:          1.918 m Patient Age:    76 years          BP:           95/59 mmHg Patient Gender: M                 HR:           82 bpm. Exam Location:  Inpatient Procedure: 2D Echo, Cardiac Doppler and Color Doppler Indications:    stroke  History:        Patient has no prior history of Echocardiogram examinations.                 Stroke and COPD, Arrythmias:Atrial Fibrillation; Risk                  Factors:Hypertension.  Sonographer:    Matthew Hall RDCS (AE, PE) Referring  calcific                             +----------+--------+--------+--------+------------------+------------------+ ICA Mid   106     26                                                    +----------+--------+--------+--------+------------------+------------------+ ICA Distal121     30                                                   +----------+--------+--------+--------+------------------+------------------+ ECA       125     10                                                   +----------+--------+--------+--------+------------------+------------------+ +----------+--------+--------+--------+-------------------+           PSV cm/sEDV cm/sDescribeArm Pressure (mmHG) +----------+--------+--------+--------+-------------------+ [id number]     0                                   +----------+--------+--------+--------+-------------------+ +---------+--------+--+--------+--+ VertebralPSV cm/s75EDV cm/s15 +---------+--------+--+--------+--+   Summary: Right Carotid: Velocities in the right ICA are consistent with Matthew Hall 1-39% stenosis. Left Carotid: Velocities in the left ICA are consistent with Matthew Hall 1-39% stenosis. Vertebrals:  Bilateral vertebral arteries demonstrate antegrade flow. Subclavians: Normal flow hemodynamics were seen in bilateral subclavian              arteries. *See table(s) above for measurements and observations.  Electronically signed by Matthew Hall on 04/13/2023 at 6:45:35 PM.    Final    ECHOCARDIOGRAM COMPLETE  Result Date: 04/13/2023    ECHOCARDIOGRAM REPORT   Patient Name:   Matthew Hall Date of Exam: 04/13/2023 Medical Rec #:  [id number]         Height:       71.0 in Accession #:    [id number]        Weight:       160.0 lb Date of Birth:  1945-07-15        BSA:          1.918 m Patient Age:    76 years          BP:           95/59 mmHg Patient Gender: M                 HR:           82 bpm. Exam Location:  Inpatient Procedure: 2D Echo, Cardiac Doppler and Color Doppler Indications:    stroke  History:        Patient has no prior history of Echocardiogram examinations.                 Stroke and COPD, Arrythmias:Atrial Fibrillation; Risk                  Factors:Hypertension.  Sonographer:    Matthew Hall RDCS (AE, PE) Referring  PROGRESS NOTE    Matthew Hall  WUJ:[id number] DOB: 07/08/1946 DOA: 04/12/2023 PCP: Alinda Deem, MD  Chief Complaint  Patient presents with   Numbness   Leg Pain    Brief Narrative:    Matthew Hall is Matthew Hall 77 y.o. male with medical history significant for atrial fibrillation, COPD, hypertension, vasculitis, bladder cancer, diabetic neuropathy. Presented to the ED with complaints of increased numbness, and weakness of his right lower extremity.  Reports he has been dragging his right lower extremity for about 2 days.  He has also had to use his wife's rollator to help with ambulation over the past 2 days.  He reports some baseline neuropathy, he denies weakness of his other extremities.  Daughter Matthew Sheldon is at bedside, patient has not had any facial asymmetry, no slurred speech, no change in vision, no confusion.     Patient was in the ED 8/26, with Matthew Hall rash to his bilateral feet, was discharged on doxycycline for possible tickborne illness, but also prednisone considering his history of vasculitis, which was suspected so as etiology.  Followed up with his rheumatologist at [facility] as recommended -he completed the doxycycline course, he is still taking the prednisone.   ED Course: Blood pressure systolic 102-150s.  INR 3.4 WBC 13.1. UDS positive for benzos, UA clean.  Right lower extremity venous Dopplers negative for DVT. CT negative for acute abnormality Brain MRI-punctate acute infarcts in the right temporal lobe, right basal ganglia, and left frontal white matter.  Query central emboli.  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 MRI was motion degraded but without evidence of large vessel occlusion. EDP asked to neurologist Dr. Zenaida Deed admission to [facility] need TEE.  Assessment & Plan:   Principal Problem:   Acute CVA (cerebrovascular accident) [facility]) Active Problems:   Atrial fibrillation, chronic (HCC)    Lymphocytic vasculitis (HCC)   Essential hypertension   Paresthesia   Anxiety   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)  Acute CVA (cerebrovascular accident) [facility]) Appreciate neurology assistance - this is likely incidental - > planning for transition coumadin to eliquis once INR <2 (no statin due side effect) MRI brain with punctate acute infarcts in R temporal lobe, right basal ganglia and left frontal white matter (query central emboli) - mild to moderate chronic small vessel ischemic disease with multiple chronic infarcts - motion degraded MRA wihtout evidence of LVO, possible moderate L A1 and severe distal R A2 stenoses Carotid US with 1-39% stenosis bilaterally, antegrade flow in bilateral vertebral arteries Echo with EF 40-45% PT/OT/SLP -> inpatient rehab  LDL 79, A1c 6.9  Neurology follow up with Dr. Terrace Arabia in 4 weeks  Right Foot Drop Appreciate neurology assistance -suspects common peroneal nerve injury  AFO vs footdrop strap Needs outpatient EMG   Lymphocytic vasculitis (HCC) Follows with Dr. Octaviano Glow at [place].  Recent and persistent rash to bilateral feet for which she was seen in the ED 8/26- there was concern for vasculitic etiology during his history, as against tickborne illness as etiology.  CRP 2.9, ESR was 23.  He was started on Maryanne Huneycutt course of steroids, and doxycycline.  He followed up with his rheumatologist, he is still taking the steroids.   -Continue prednisone 10 mg daily - sed rate 38, CRP 5.7 -follow outpatient with rheumatology   Atrial fibrillation, chronic (HCC) EKG currently showing atrial fibrillation.   INR 3.4 at presentation -Resume home 25 mg metoprolol for rate control -planning to transition to eliquis once INR <2  -  triphasic                   +---------+------------------+-----+-------------------+--------+ PTA      98                0.88 dampened monophasic         +---------+------------------+-----+-------------------+--------+ DP       110               0.99 biphasic                    +---------+------------------+-----+-------------------+--------+ Great Toe66                0.59 Normal                      +---------+------------------+-----+-------------------+--------+ +---------+------------------+-----+---------+-------+ Left     Lt Pressure (mmHg)IndexWaveform Comment +---------+------------------+-----+---------+-------+ Brachial 109                    triphasic        +---------+------------------+-----+---------+-------+ PTA      115               1.04 triphasic        +---------+------------------+-----+---------+-------+ DP       107               0.96 biphasic         +---------+------------------+-----+---------+-------+ Great Toe0                 0.00 Absent           +---------+------------------+-----+---------+-------+ +-------+-----------+-----------+------------+------------+ ABI/TBIToday's ABIToday's TBIPrevious ABIPrevious TBI +-------+-----------+-----------+------------+------------+ Right  0.99       0.59                                +-------+-----------+-----------+------------+------------+ Left   1.04       0                                    +-------+-----------+-----------+------------+------------+  Summary: Right: Resting right ankle-brachial index is within normal range. The right toe-brachial index is abnormal. Left: Resting left ankle-brachial index is within normal range. The left toe-brachial index is abnormal. *See table(s) above for measurements and observations.  Electronically signed by Matthew Hall on 04/13/2023 at 6:49:39 PM.    Final    VAS US CAROTID  Result Date: 04/13/2023 Carotid Arterial Duplex Study Patient Name:  TREYTEN MONESTIME  Date of Exam:   04/13/2023 Medical Rec #: [id number]          Accession #:    [id number] Date of Birth: 1946/04/25         Patient Gender: M Patient Age:   77 years Exam Location:  [facility] Procedure:      VAS US CAROTID Referring Phys: Kennis Carina --------------------------------------------------------------------------------  Indications:   CVA. Risk Factors:  Hypertension, Diabetes, prior CVA. Other Factors: Vasculitis. Performing Technologist: Matthew Hall  Examination Guidelines: Chrisie Jankovich complete evaluation includes B-mode imaging, spectral Doppler, color Doppler, and power Doppler as needed of all accessible portions of each vessel. Bilateral testing is considered an integral part of Maliya Marich complete examination. Limited examinations for reoccurring indications may be performed as noted.  Right Carotid Findings: +----------+--------+--------+--------+---------------------+------------------+           PSV cm/sEDV cm/sStenosisPlaque Description   Comments           +----------+--------+--------+--------+---------------------+------------------+  calcific                             +----------+--------+--------+--------+------------------+------------------+ ICA Mid   106     26                                                    +----------+--------+--------+--------+------------------+------------------+ ICA Distal121     30                                                   +----------+--------+--------+--------+------------------+------------------+ ECA       125     10                                                   +----------+--------+--------+--------+------------------+------------------+ +----------+--------+--------+--------+-------------------+           PSV cm/sEDV cm/sDescribeArm Pressure (mmHG) +----------+--------+--------+--------+-------------------+ [id number]     0                                   +----------+--------+--------+--------+-------------------+ +---------+--------+--+--------+--+ VertebralPSV cm/s75EDV cm/s15 +---------+--------+--+--------+--+   Summary: Right Carotid: Velocities in the right ICA are consistent with Matthew Hall 1-39% stenosis. Left Carotid: Velocities in the left ICA are consistent with Matthew Hall 1-39% stenosis. Vertebrals:  Bilateral vertebral arteries demonstrate antegrade flow. Subclavians: Normal flow hemodynamics were seen in bilateral subclavian              arteries. *See table(s) above for measurements and observations.  Electronically signed by Matthew Hall on 04/13/2023 at 6:45:35 PM.    Final    ECHOCARDIOGRAM COMPLETE  Result Date: 04/13/2023    ECHOCARDIOGRAM REPORT   Patient Name:   Matthew Hall Date of Exam: 04/13/2023 Medical Rec #:  [id number]         Height:       71.0 in Accession #:    [id number]        Weight:       160.0 lb Date of Birth:  1945-07-15        BSA:          1.918 m Patient Age:    76 years          BP:           95/59 mmHg Patient Gender: M                 HR:           82 bpm. Exam Location:  Inpatient Procedure: 2D Echo, Cardiac Doppler and Color Doppler Indications:    stroke  History:        Patient has no prior history of Echocardiogram examinations.                 Stroke and COPD, Arrythmias:Atrial Fibrillation; Risk                  Factors:Hypertension.  Sonographer:    Matthew Hall RDCS (AE, PE) Referring  PROGRESS NOTE    Matthew Hall  WUJ:[id number] DOB: 07/08/1946 DOA: 04/12/2023 PCP: Alinda Deem, MD  Chief Complaint  Patient presents with   Numbness   Leg Pain    Brief Narrative:    Matthew Hall is Matthew Hall 77 y.o. male with medical history significant for atrial fibrillation, COPD, hypertension, vasculitis, bladder cancer, diabetic neuropathy. Presented to the ED with complaints of increased numbness, and weakness of his right lower extremity.  Reports he has been dragging his right lower extremity for about 2 days.  He has also had to use his wife's rollator to help with ambulation over the past 2 days.  He reports some baseline neuropathy, he denies weakness of his other extremities.  Daughter Matthew Sheldon is at bedside, patient has not had any facial asymmetry, no slurred speech, no change in vision, no confusion.     Patient was in the ED 8/26, with Matthew Hall rash to his bilateral feet, was discharged on doxycycline for possible tickborne illness, but also prednisone considering his history of vasculitis, which was suspected so as etiology.  Followed up with his rheumatologist at [facility] as recommended -he completed the doxycycline course, he is still taking the prednisone.   ED Course: Blood pressure systolic 102-150s.  INR 3.4 WBC 13.1. UDS positive for benzos, UA clean.  Right lower extremity venous Dopplers negative for DVT. CT negative for acute abnormality Brain MRI-punctate acute infarcts in the right temporal lobe, right basal ganglia, and left frontal white matter.  Query central emboli.  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 MRI was motion degraded but without evidence of large vessel occlusion. EDP asked to neurologist Dr. Zenaida Deed admission to [facility] need TEE.  Assessment & Plan:   Principal Problem:   Acute CVA (cerebrovascular accident) [facility]) Active Problems:   Atrial fibrillation, chronic (HCC)    Lymphocytic vasculitis (HCC)   Essential hypertension   Paresthesia   Anxiety   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)  Acute CVA (cerebrovascular accident) [facility]) Appreciate neurology assistance - this is likely incidental - > planning for transition coumadin to eliquis once INR <2 (no statin due side effect) MRI brain with punctate acute infarcts in R temporal lobe, right basal ganglia and left frontal white matter (query central emboli) - mild to moderate chronic small vessel ischemic disease with multiple chronic infarcts - motion degraded MRA wihtout evidence of LVO, possible moderate L A1 and severe distal R A2 stenoses Carotid US with 1-39% stenosis bilaterally, antegrade flow in bilateral vertebral arteries Echo with EF 40-45% PT/OT/SLP -> inpatient rehab  LDL 79, A1c 6.9  Neurology follow up with Dr. Terrace Arabia in 4 weeks  Right Foot Drop Appreciate neurology assistance -suspects common peroneal nerve injury  AFO vs footdrop strap Needs outpatient EMG   Lymphocytic vasculitis (HCC) Follows with Dr. Octaviano Glow at [place].  Recent and persistent rash to bilateral feet for which she was seen in the ED 8/26- there was concern for vasculitic etiology during his history, as against tickborne illness as etiology.  CRP 2.9, ESR was 23.  He was started on Maryanne Huneycutt course of steroids, and doxycycline.  He followed up with his rheumatologist, he is still taking the steroids.   -Continue prednisone 10 mg daily - sed rate 38, CRP 5.7 -follow outpatient with rheumatology   Atrial fibrillation, chronic (HCC) EKG currently showing atrial fibrillation.   INR 3.4 at presentation -Resume home 25 mg metoprolol for rate control -planning to transition to eliquis once INR <2  -

## 2023-04-13 NOTE — TOC Initial Note (Signed)
Transition of Care [facility]) - Initial/Assessment Note    Patient Details  Name: Matthew Hall MRN: [id number] Date of Birth: Oct 03, 1945  Transition of Care [facility]) CM/SW Contact:    Kermit Balo, RN Phone Number: 04/13/2023, 1:37 PM  Clinical Narrative:                  Patient is from home with his spouse who he cares for and assists at home.  No personnel DME.  Pt drives and manages his own medications. They have an aide that does some cleaning once a week.  Current recommendations are for CIR. Awaiting their eval. TOC following.  Expected Discharge Plan: IP Rehab Facility Barriers to Discharge: Continued Medical Work up   Patient Goals and CMS Choice   CMS Medicare.gov Compare Post Acute Care list provided to:: Patient Represenative (must comment) Choice offered to / list presented to : Adult Children      Expected Discharge Plan and Services   Discharge Planning Services: CM Consult Post Acute Care Choice: IP Rehab Living arrangements for the past 2 months: Single Family Home                                      Prior Living Arrangements/Services Living arrangements for the past 2 months: Single Family Home Lives with:: Spouse Patient language and need for interpreter reviewed:: Yes Do you feel safe going back to the place where you live?: Yes            Criminal Activity/Legal Involvement Pertinent to Current Situation/Hospitalization: No - Comment as needed  Activities of Daily Living      Permission Sought/Granted                  Emotional Assessment Appearance:: Appears stated age   Affect (typically observed): Quiet Orientation: : Oriented to Self, Oriented to Place, Oriented to  Time, Oriented to Situation   Psych Involvement: No (comment)  Admission diagnosis:  Anticoagulated on Coumadin [Z79.01] Acute CVA (cerebrovascular accident) [facility]) [I63.9] Cerebrovascular accident (CVA), unspecified mechanism (HCC) [I63.9] Patient  Active Problem List   Diagnosis Date Noted   Acute CVA (cerebrovascular accident) (HCC) 04/12/2023   Anxiety 04/12/2023   Diabetic peripheral neuropathy (HCC) 12/27/2020   Paresthesia 12/01/2020   Pneumonia due to COVID-19 virus 10/29/2020   Hypokalemia 10/29/2020   Healthcare maintenance 07/25/2019   Leucocytosis 08/11/2017   Elevated sed rate 05/21/2017   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) 03/01/2017   Lymphocytic vasculitis (HCC) 02/20/2017   Atrial fibrillation, chronic (HCC) 07/09/2016   BRONCHIECTASIS W/O ACUTE EXACERBATION 03/31/2008   Essential hypertension 09/13/2007   Allergic rhinitis 09/13/2007   EMPHYSEMA 09/13/2007   COPD exacerbation (HCC) 09/13/2007   PCP:  Alinda Deem, MD Pharmacy:   [facility] [phone] [place], [place] - [address] [place] [address] Phone: [phone] Fax: [phone]     Social Determinants of Health (SDOH) Social History: SDOH Screenings   Depression (PHQ2-9): Low Risk  (05/04/2019)  Tobacco Use: Medium Risk (04/12/2023)   SDOH Interventions:     Readmission Risk Interventions     No data to display

## 2023-04-14 DIAGNOSIS — I639 Cerebral infarction, unspecified: Secondary | ICD-10-CM | POA: Diagnosis not present

## 2023-04-14 LAB — CBC
HCT: 43.4 % (ref 39.0–52.0)
Hemoglobin: 14.1 g/dL (ref 13.0–17.0)
MCH: 30.1 pg (ref 26.0–34.0)
MCHC: 32.5 g/dL (ref 30.0–36.0)
MCV: 92.5 fL (ref 80.0–100.0)
Platelets: 391 10*3/uL (ref 150–400)
RBC: 4.69 MIL/uL (ref 4.22–5.81)
RDW: 13.5 % (ref 11.5–15.5)
WBC: 15.5 10*3/uL — ABNORMAL HIGH (ref 4.0–10.5)
nRBC: 0 % (ref 0.0–0.2)

## 2023-04-14 LAB — GLUCOSE, CAPILLARY
Glucose-Capillary: 124 mg/dL — ABNORMAL HIGH (ref 70–99)
Glucose-Capillary: 270 mg/dL — ABNORMAL HIGH (ref 70–99)
Glucose-Capillary: 123 mg/dL — ABNORMAL HIGH (ref 70–99)
Glucose-Capillary: 159 mg/dL — ABNORMAL HIGH (ref 70–99)

## 2023-04-14 LAB — BASIC METABOLIC PANEL
Anion gap: 13 (ref 5–15)
BUN: 30 mg/dL — ABNORMAL HIGH (ref 8–23)
CO2: 28 mmol/L (ref 22–32)
Calcium: 8.9 mg/dL (ref 8.9–10.3)
Chloride: 94 mmol/L — ABNORMAL LOW (ref 98–111)
Creatinine, Ser: 1.09 mg/dL (ref 0.61–1.24)
GFR, Estimated: >60 mL/min (ref >60)
Glucose, Bld: 153 mg/dL — ABNORMAL HIGH (ref 70–99)
Potassium: 3.6 mmol/L (ref 3.5–5.1)
Sodium: 135 mmol/L (ref 135–145)

## 2023-04-14 LAB — PROTIME-INR
INR: 2.8 — ABNORMAL HIGH (ref 0.8–1.2)
Prothrombin Time: 29.8 s — ABNORMAL HIGH (ref 11.4–15.2)

## 2023-04-14 MED ORDER — METFORMIN HCL 500 MG PO TABS
500.0000 mg | ORAL_TABLET | Freq: Every day | ORAL | Status: DC
  Administered 2023-04-15 – 2023-04-18 (×4): 500 mg via ORAL
  Filled 2023-04-14 (×4): qty 1

## 2023-04-14 MED ORDER — POLYVINYL ALCOHOL 1.4 % OP SOLN
2.0000 [drp] | OPHTHALMIC | Status: DC | PRN
  Administered 2023-04-15: 2 [drp] via OPHTHALMIC
  Filled 2023-04-14: qty 15

## 2023-04-14 NOTE — Inpatient Diabetes Management (Addendum)
Inpatient Diabetes Program Recommendations  AACE/ADA: New Consensus Statement on Inpatient Glycemic Control (2015)  Target Ranges:  Prepandial:   less than 140 mg/dL      Peak postprandial:   less than 180 mg/dL (1-2 hours)      Critically ill patients:  140 - 180 mg/dL    Latest Reference Range & Units 04/13/23 21:46 04/14/23 06:14  Glucose-Capillary 70 - 99 mg/dL 161 (H) 096 (H)    Latest Reference Range & Units 04/12/23 09:39  Hemoglobin A1C 4.8 - 5.6 % 6.9 (H)   Review of Glycemic Control  Diabetes history: Borderline DM Outpatient Diabetes medications: None; Prednisone 10 mg BID Current orders for Inpatient glycemic control: None; Prednisone 10 mg BID  Inpatient Diabetes Program Recommendations:    Insulin: While inpatient and ordered steroids, may want to consider ordering CBGs AC&HS and Novolog 0-9 units TID with meals.  HbgA1C:  A1C 6.9% on 04/12/23 indicating an average glucose of 154 mg/dl. Patient has been on Prednisone outpatient and currently ordered Prednisone as inpatient. Would recommend patient to follow up with PCP regarding glycemic control and that A1C be repeated after patient has been off Prednisone for 3 months.   NOTE: Noted consult for Diabetes Coordinator. Diabetes Coordinator is not on campus over the weekend but available by pager from 8am to 5pm for questions or concerns. Patient admitted with acute CVA and foot drop. Per chart, patient went to ED on 03/05/23 for vasculitis and was prescribed Medrol dosepak. Per home medication list patient is taking Prednisone 10 mg BID as an outpatient. A1C 6.9% on 04/12/23. Given recent steroids, would recommend patient follow up with PCP regarding glycemic control and have A1C repeated in 3 months.   Addendum 04/14/23@10 :55-Spoke with patient over the phone. Patient reports that he has had borderline DM for years and his last A1C was 6.8%. Patient confirms that he has been on steroids since the end of August and his  rheumatoid specialist has him on Prednisone 5 mg BID and he will stay on that for now until he follows up with rheumatoid specialist in 2 months. Patient reports that he half way follows carb modified diet but since he had a stroke he plans to make dietary changes and expects that will help his glucose control.  Discussed following carb modified diet to help with glycemic control. Patient states his wife has DM and he gives her insulin (he is her caretaker) and she has a [employer] CGM so he is familiar with diabetes and DM management.  Discussed how steroids can contribute to elevated glucose and that if he continued on steroids chronically, he may need to be started on medication for DM. Patient states he will plan to follow up with his PCP in a couple months and his PCP checks his A1C every 3 months so they will discuss glycemic control at that time if needed.   Thanks, Orlando Penner, RN, MSN, CDCES Diabetes Coordinator Inpatient Diabetes Program [phone] (Team Pager from 8am to 5pm)

## 2023-04-14 NOTE — Plan of Care (Signed)
  Problem: Activity: Goal: Risk for activity intolerance will decrease Outcome: Progressing   Problem: Nutrition: Goal: Adequate nutrition will be maintained Outcome: Progressing   Problem: Coping: Goal: Level of anxiety will decrease Outcome: Progressing   Problem: Safety: Goal: Ability to remain free from injury will improve Outcome: Progressing   Problem: Skin Integrity: Goal: Risk for impaired skin integrity will decrease Outcome: Progressing   

## 2023-04-14 NOTE — Progress Notes (Signed)
Extremities: no LEE    Data Reviewed: I have personally reviewed following labs and imaging studies  CBC: Recent Labs  Lab 04/12/23 0939 04/12/23 0948 04/14/23 0424  WBC 13.1*  --  15.5*  NEUTROABS 10.4*  --   --   HGB 14.0 15.6 14.1  HCT 44.3 46.0 43.4  MCV 93.3  --  92.5  PLT 414*  --  391    Basic Metabolic Panel: Recent Labs  Lab 04/12/23 0939 04/12/23 0948 04/14/23 0424  NA 135 137 135  K 3.5 3.7 3.6  CL 96* 98 94*  CO2 28  --  28  GLUCOSE 111* 112* 153*  BUN 27* 30* 30*  CREATININE 1.01 1.00 1.09  CALCIUM 8.9  --  8.9    GFR: Estimated Creatinine Clearance: 57.4 mL/min (by C-G formula based on SCr of 1.09 mg/dL).  Liver Function Tests: Recent Labs  Lab 04/12/23 0939  AST 15  ALT 21  ALKPHOS 48  BILITOT 0.6  PROT  7.0  ALBUMIN 3.0*    CBG: Recent Labs  Lab 04/13/23 2146 04/14/23 0614 04/14/23 1131  GLUCAP 138* 124* 270*     Recent Results (from the past 240 hour(s))  Culture, blood (Routine X 2) w Reflex to ID Panel     Status: None (Preliminary result)   Collection Time: 04/12/23  7:37 PM   Specimen: Right Antecubital; Blood  Result Value Ref Range Status   Specimen Description RIGHT ANTECUBITAL  Final   Special Requests   Final    BOTTLES DRAWN AEROBIC AND ANAEROBIC Blood Culture results may not be optimal due to an excessive volume of blood received in culture bottles   Culture   Final    NO GROWTH 2 DAYS Performed at [facility], [address], [place], [place] [address]    Report Status PENDING  Incomplete  Culture, blood (Routine X 2) w Reflex to ID Panel     Status: None (Preliminary result)   Collection Time: 04/12/23  7:48 PM   Specimen: BLOOD RIGHT FOREARM  Result Value Ref Range Status   Specimen Description BLOOD RIGHT FOREARM  Final   Special Requests   Final    BOTTLES DRAWN AEROBIC AND ANAEROBIC Blood Culture adequate volume   Culture   Final    NO GROWTH 2 DAYS Performed at [facility], [address] [place], [place] [address]    Report Status PENDING  Incomplete         Radiology Studies: VAS [place] ABI WITH/WO TBI  Result Date: 04/13/2023  LOWER EXTREMITY DOPPLER STUDY Patient Name:  LORRIN NAWROT  Date of Exam:   04/13/2023 Medical Rec #: [id number]          Accession #:    [id number] Date of Birth: 04/02/1946         Patient Gender: M Patient Age:   2 years Exam Location:  [facility] Procedure:      VAS [place] ABI WITH/WO TBI Referring Phys: Chrisangel Eskenazi POWELL JR --------------------------------------------------------------------------------  Indications: Diminished pulses High Risk Factors: Hypertension, Diabetes, prior CVA.  Vascular Interventions: Vasculitis. Performing Technologist: Shona Simpson  Examination Guidelines: Emalea Mix complete evaluation includes at  minimum, Doppler waveform signals and systolic blood pressure reading at the level of bilateral brachial, anterior tibial, and posterior tibial arteries, when vessel segments are accessible. Bilateral testing is considered an integral part of Delshon Blanchfield complete examination. Photoelectric Plethysmograph (PPG) waveforms and toe systolic pressure readings are included as required and additional duplex testing as  17                                tortuous           +----------+--------+--------+--------+------------------+------------------+ ICA  Prox  77      23      1-39%   calcific                             +----------+--------+--------+--------+------------------+------------------+ ICA Mid   106     26                                                   +----------+--------+--------+--------+------------------+------------------+ ICA Distal121     30                                                   +----------+--------+--------+--------+------------------+------------------+ ECA       125     10                                                   +----------+--------+--------+--------+------------------+------------------+ +----------+--------+--------+--------+-------------------+           PSV cm/sEDV cm/sDescribeArm Pressure (mmHG) +----------+--------+--------+--------+-------------------+ [id number]     0                                   +----------+--------+--------+--------+-------------------+ +---------+--------+--+--------+--+ VertebralPSV cm/s75EDV cm/s15 +---------+--------+--+--------+--+   Summary: Right Carotid: Velocities in the right ICA are consistent with Brailyn Delman 1-39% stenosis. Left Carotid: Velocities in the left ICA are consistent with Daymon Hora 1-39% stenosis. Vertebrals:  Bilateral vertebral arteries demonstrate antegrade flow. Subclavians: Normal flow hemodynamics were seen in bilateral subclavian              arteries. *See table(s) above for measurements and observations.  Electronically signed by Heath Lark on 04/13/2023 at 6:45:35 PM.    Final    ECHOCARDIOGRAM COMPLETE  Result Date: 04/13/2023    ECHOCARDIOGRAM REPORT   Patient Name:   DERYK BOZMAN Date of Exam: 04/13/2023 Medical Rec #:  [id number]         Height:       71.0 in Accession #:    [id number]        Weight:       160.0 lb Date of Birth:  1946-07-03        BSA:          1.918 m Patient Age:    76 years          BP:           95/59 mmHg Patient Gender: M                 HR:           82 bpm. Exam Location:  Inpatient  Procedure: 2D Echo, Cardiac Doppler and Color Doppler Indications:    stroke  History:  PROGRESS NOTE    LAITH ANTONELLI  QVZ:[id number] DOB: Oct 08, 1945 DOA: 04/12/2023 PCP: Alinda Deem, MD  Chief Complaint  Patient presents with   Numbness   Leg Pain    Brief Narrative:    Matthew Hall is Matthew Hall 77 y.o. male with medical history significant for atrial fibrillation, COPD, hypertension, vasculitis, bladder cancer, diabetic neuropathy. Presented to the ED with complaints of increased numbness, and weakness of his right lower extremity.  Reports he has been dragging his right lower extremity for about 2 days.  He has also had to use his wife's rollator to help with ambulation over the past 2 days.  He reports some baseline neuropathy, he denies weakness of his other extremities.  Daughter [name] is at bedside, patient has not had any facial asymmetry, no slurred speech, no change in vision, no confusion.     Patient was in the ED 8/26, with Charlsie Fleeger rash to his bilateral feet, was discharged on doxycycline for possible tickborne illness, but also prednisone considering his history of vasculitis, which was suspected so as etiology.  Followed up with his rheumatologist at [facility] as recommended -he completed the doxycycline course, he is still taking the prednisone.   ED Course: Blood pressure systolic 102-150s.  INR 3.4 WBC 13.1. UDS positive for benzos, UA clean.  Right lower extremity venous Dopplers negative for DVT. CT negative for acute abnormality Brain MRI-punctate acute infarcts in the right temporal lobe, right basal ganglia, and left frontal white matter.  Query central emboli.  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 MRI was motion degraded but without evidence of large vessel occlusion. EDP asked to neurologist Dr. Zenaida Deed admission to [facility] need TEE.  Assessment & Plan:   Principal Problem:   Acute CVA (cerebrovascular accident) [facility]) Active Problems:   Atrial fibrillation, chronic (HCC)    Lymphocytic vasculitis (HCC)   Essential hypertension   Paresthesia   Anxiety   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)  Acute CVA (cerebrovascular accident) [facility]) Appreciate neurology assistance - this is likely incidental - > planning for transition coumadin to eliquis once INR <2 (no statin due side effect) MRI brain with punctate acute infarcts in R temporal lobe, right basal ganglia and left frontal white matter (query central emboli) - mild to moderate chronic small vessel ischemic disease with multiple chronic infarcts - motion degraded MRA wihtout evidence of LVO, possible moderate L A1 and severe distal R A2 stenoses Carotid US with 1-39% stenosis bilaterally, antegrade flow in bilateral vertebral arteries Echo with EF 40-45% PT/OT/SLP -> inpatient rehab  LDL 79, A1c 6.9  Neurology follow up with Dr. Terrace Arabia in 4 weeks  Right Foot Drop Appreciate neurology assistance -suspects common peroneal nerve injury  AFO vs footdrop strap, appreciate therapy assistance Needs outpatient EMG   Lymphocytic vasculitis (HCC) Follows with Dr. Octaviano Glow at [facility].  Recent and persistent rash to bilateral feet for which she was seen in the ED 8/26- there was concern for vasculitic etiology during his history, as against tickborne illness as etiology.  CRP 2.9, ESR was 23.  He was started on Dotty Gonzalo course of steroids, and doxycycline.  He followed up with his rheumatologist, he is still taking the steroids.   -Continue prednisone 10 mg daily - sed rate 38, CRP 5.7 -follow outpatient with rheumatology   Atrial fibrillation, chronic (HCC) EKG currently showing atrial fibrillation.   INR 3.4 at presentation -Resume home 25 mg metoprolol for rate control -planning to transition to eliquis  needed. Limited examinations for reoccurring indications may be performed as noted.  ABI Findings: +---------+------------------+-----+-------------------+--------+ Right    Rt Pressure (mmHg)IndexWaveform           Comment  +---------+------------------+-----+-------------------+--------+ Brachial 111                    triphasic                   +---------+------------------+-----+-------------------+--------+ PTA      98                0.88 dampened monophasic         +---------+------------------+-----+-------------------+--------+ DP       110               0.99 biphasic                    +---------+------------------+-----+-------------------+--------+ Great Toe66                0.59 Normal                      +---------+------------------+-----+-------------------+--------+ +---------+------------------+-----+---------+-------+ Left     Lt Pressure (mmHg)IndexWaveform Comment +---------+------------------+-----+---------+-------+ Brachial 109                    triphasic        +---------+------------------+-----+---------+-------+ PTA      115               1.04 triphasic        +---------+------------------+-----+---------+-------+ DP       107               0.96 biphasic         +---------+------------------+-----+---------+-------+ Great Toe0                 0.00 Absent           +---------+------------------+-----+---------+-------+ +-------+-----------+-----------+------------+------------+ ABI/TBIToday's ABIToday's TBIPrevious ABIPrevious  TBI +-------+-----------+-----------+------------+------------+ Right  0.99       0.59                                +-------+-----------+-----------+------------+------------+ Left   1.04       0                                   +-------+-----------+-----------+------------+------------+  Summary: Right: Resting right ankle-brachial index is within normal range. The right toe-brachial index is abnormal. Left: Resting left ankle-brachial index is within normal range. The left toe-brachial index is abnormal. *See table(s) above for measurements and observations.  Electronically signed by Heath Lark on 04/13/2023 at 6:49:39 PM.    Final    VAS US CAROTID  Result Date: 04/13/2023 Carotid Arterial Duplex Study Patient Name:  CLAYSON RILING  Date of Exam:   04/13/2023 Medical Rec #: [id number]          Accession #:    [id number] Date of Birth: 1945-12-09         Patient Gender: M Patient Age:   48 years Exam Location:  [facility] Procedure:      VAS US CAROTID Referring Phys: Kennis Carina --------------------------------------------------------------------------------  Indications:   CVA. Risk Factors:  Hypertension, Diabetes, prior CVA. Other Factors: Vasculitis. Performing Technologist: Shona Simpson  Examination Guidelines: Saloni Lablanc complete evaluation includes B-mode imaging, spectral Doppler, color Doppler,  PROGRESS NOTE    LAITH ANTONELLI  QVZ:[id number] DOB: Oct 08, 1945 DOA: 04/12/2023 PCP: Alinda Deem, MD  Chief Complaint  Patient presents with   Numbness   Leg Pain    Brief Narrative:    Matthew Hall is Matthew Hall 77 y.o. male with medical history significant for atrial fibrillation, COPD, hypertension, vasculitis, bladder cancer, diabetic neuropathy. Presented to the ED with complaints of increased numbness, and weakness of his right lower extremity.  Reports he has been dragging his right lower extremity for about 2 days.  He has also had to use his wife's rollator to help with ambulation over the past 2 days.  He reports some baseline neuropathy, he denies weakness of his other extremities.  Daughter [name] is at bedside, patient has not had any facial asymmetry, no slurred speech, no change in vision, no confusion.     Patient was in the ED 8/26, with Charlsie Fleeger rash to his bilateral feet, was discharged on doxycycline for possible tickborne illness, but also prednisone considering his history of vasculitis, which was suspected so as etiology.  Followed up with his rheumatologist at [facility] as recommended -he completed the doxycycline course, he is still taking the prednisone.   ED Course: Blood pressure systolic 102-150s.  INR 3.4 WBC 13.1. UDS positive for benzos, UA clean.  Right lower extremity venous Dopplers negative for DVT. CT negative for acute abnormality Brain MRI-punctate acute infarcts in the right temporal lobe, right basal ganglia, and left frontal white matter.  Query central emboli.  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 MRI was motion degraded but without evidence of large vessel occlusion. EDP asked to neurologist Dr. Zenaida Deed admission to [facility] need TEE.  Assessment & Plan:   Principal Problem:   Acute CVA (cerebrovascular accident) [facility]) Active Problems:   Atrial fibrillation, chronic (HCC)    Lymphocytic vasculitis (HCC)   Essential hypertension   Paresthesia   Anxiety   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)  Acute CVA (cerebrovascular accident) [facility]) Appreciate neurology assistance - this is likely incidental - > planning for transition coumadin to eliquis once INR <2 (no statin due side effect) MRI brain with punctate acute infarcts in R temporal lobe, right basal ganglia and left frontal white matter (query central emboli) - mild to moderate chronic small vessel ischemic disease with multiple chronic infarcts - motion degraded MRA wihtout evidence of LVO, possible moderate L A1 and severe distal R A2 stenoses Carotid US with 1-39% stenosis bilaterally, antegrade flow in bilateral vertebral arteries Echo with EF 40-45% PT/OT/SLP -> inpatient rehab  LDL 79, A1c 6.9  Neurology follow up with Dr. Terrace Arabia in 4 weeks  Right Foot Drop Appreciate neurology assistance -suspects common peroneal nerve injury  AFO vs footdrop strap, appreciate therapy assistance Needs outpatient EMG   Lymphocytic vasculitis (HCC) Follows with Dr. Octaviano Glow at [facility].  Recent and persistent rash to bilateral feet for which she was seen in the ED 8/26- there was concern for vasculitic etiology during his history, as against tickborne illness as etiology.  CRP 2.9, ESR was 23.  He was started on Dotty Gonzalo course of steroids, and doxycycline.  He followed up with his rheumatologist, he is still taking the steroids.   -Continue prednisone 10 mg daily - sed rate 38, CRP 5.7 -follow outpatient with rheumatology   Atrial fibrillation, chronic (HCC) EKG currently showing atrial fibrillation.   INR 3.4 at presentation -Resume home 25 mg metoprolol for rate control -planning to transition to eliquis  Extremities: no LEE    Data Reviewed: I have personally reviewed following labs and imaging studies  CBC: Recent Labs  Lab 04/12/23 0939 04/12/23 0948 04/14/23 0424  WBC 13.1*  --  15.5*  NEUTROABS 10.4*  --   --   HGB 14.0 15.6 14.1  HCT 44.3 46.0 43.4  MCV 93.3  --  92.5  PLT 414*  --  391    Basic Metabolic Panel: Recent Labs  Lab 04/12/23 0939 04/12/23 0948 04/14/23 0424  NA 135 137 135  K 3.5 3.7 3.6  CL 96* 98 94*  CO2 28  --  28  GLUCOSE 111* 112* 153*  BUN 27* 30* 30*  CREATININE 1.01 1.00 1.09  CALCIUM 8.9  --  8.9    GFR: Estimated Creatinine Clearance: 57.4 mL/min (by C-G formula based on SCr of 1.09 mg/dL).  Liver Function Tests: Recent Labs  Lab 04/12/23 0939  AST 15  ALT 21  ALKPHOS 48  BILITOT 0.6  PROT  7.0  ALBUMIN 3.0*    CBG: Recent Labs  Lab 04/13/23 2146 04/14/23 0614 04/14/23 1131  GLUCAP 138* 124* 270*     Recent Results (from the past 240 hour(s))  Culture, blood (Routine X 2) w Reflex to ID Panel     Status: None (Preliminary result)   Collection Time: 04/12/23  7:37 PM   Specimen: Right Antecubital; Blood  Result Value Ref Range Status   Specimen Description RIGHT ANTECUBITAL  Final   Special Requests   Final    BOTTLES DRAWN AEROBIC AND ANAEROBIC Blood Culture results may not be optimal due to an excessive volume of blood received in culture bottles   Culture   Final    NO GROWTH 2 DAYS Performed at [facility], [address], [place], [place] [address]    Report Status PENDING  Incomplete  Culture, blood (Routine X 2) w Reflex to ID Panel     Status: None (Preliminary result)   Collection Time: 04/12/23  7:48 PM   Specimen: BLOOD RIGHT FOREARM  Result Value Ref Range Status   Specimen Description BLOOD RIGHT FOREARM  Final   Special Requests   Final    BOTTLES DRAWN AEROBIC AND ANAEROBIC Blood Culture adequate volume   Culture   Final    NO GROWTH 2 DAYS Performed at [facility], [address] [place], [place] [address]    Report Status PENDING  Incomplete         Radiology Studies: VAS [place] ABI WITH/WO TBI  Result Date: 04/13/2023  LOWER EXTREMITY DOPPLER STUDY Patient Name:  LORRIN NAWROT  Date of Exam:   04/13/2023 Medical Rec #: [id number]          Accession #:    [id number] Date of Birth: 04/02/1946         Patient Gender: M Patient Age:   2 years Exam Location:  [facility] Procedure:      VAS [place] ABI WITH/WO TBI Referring Phys: Chrisangel Eskenazi POWELL JR --------------------------------------------------------------------------------  Indications: Diminished pulses High Risk Factors: Hypertension, Diabetes, prior CVA.  Vascular Interventions: Vasculitis. Performing Technologist: Shona Simpson  Examination Guidelines: Emalea Mix complete evaluation includes at  minimum, Doppler waveform signals and systolic blood pressure reading at the level of bilateral brachial, anterior tibial, and posterior tibial arteries, when vessel segments are accessible. Bilateral testing is considered an integral part of Delshon Blanchfield complete examination. Photoelectric Plethysmograph (PPG) waveforms and toe systolic pressure readings are included as required and additional duplex testing as  Extremities: no LEE    Data Reviewed: I have personally reviewed following labs and imaging studies  CBC: Recent Labs  Lab 04/12/23 0939 04/12/23 0948 04/14/23 0424  WBC 13.1*  --  15.5*  NEUTROABS 10.4*  --   --   HGB 14.0 15.6 14.1  HCT 44.3 46.0 43.4  MCV 93.3  --  92.5  PLT 414*  --  391    Basic Metabolic Panel: Recent Labs  Lab 04/12/23 0939 04/12/23 0948 04/14/23 0424  NA 135 137 135  K 3.5 3.7 3.6  CL 96* 98 94*  CO2 28  --  28  GLUCOSE 111* 112* 153*  BUN 27* 30* 30*  CREATININE 1.01 1.00 1.09  CALCIUM 8.9  --  8.9    GFR: Estimated Creatinine Clearance: 57.4 mL/min (by C-G formula based on SCr of 1.09 mg/dL).  Liver Function Tests: Recent Labs  Lab 04/12/23 0939  AST 15  ALT 21  ALKPHOS 48  BILITOT 0.6  PROT  7.0  ALBUMIN 3.0*    CBG: Recent Labs  Lab 04/13/23 2146 04/14/23 0614 04/14/23 1131  GLUCAP 138* 124* 270*     Recent Results (from the past 240 hour(s))  Culture, blood (Routine X 2) w Reflex to ID Panel     Status: None (Preliminary result)   Collection Time: 04/12/23  7:37 PM   Specimen: Right Antecubital; Blood  Result Value Ref Range Status   Specimen Description RIGHT ANTECUBITAL  Final   Special Requests   Final    BOTTLES DRAWN AEROBIC AND ANAEROBIC Blood Culture results may not be optimal due to an excessive volume of blood received in culture bottles   Culture   Final    NO GROWTH 2 DAYS Performed at [facility], [address], [place], [place] [address]    Report Status PENDING  Incomplete  Culture, blood (Routine X 2) w Reflex to ID Panel     Status: None (Preliminary result)   Collection Time: 04/12/23  7:48 PM   Specimen: BLOOD RIGHT FOREARM  Result Value Ref Range Status   Specimen Description BLOOD RIGHT FOREARM  Final   Special Requests   Final    BOTTLES DRAWN AEROBIC AND ANAEROBIC Blood Culture adequate volume   Culture   Final    NO GROWTH 2 DAYS Performed at [facility], [address] [place], [place] [address]    Report Status PENDING  Incomplete         Radiology Studies: VAS [place] ABI WITH/WO TBI  Result Date: 04/13/2023  LOWER EXTREMITY DOPPLER STUDY Patient Name:  LORRIN NAWROT  Date of Exam:   04/13/2023 Medical Rec #: [id number]          Accession #:    [id number] Date of Birth: 04/02/1946         Patient Gender: M Patient Age:   2 years Exam Location:  [facility] Procedure:      VAS [place] ABI WITH/WO TBI Referring Phys: Chrisangel Eskenazi POWELL JR --------------------------------------------------------------------------------  Indications: Diminished pulses High Risk Factors: Hypertension, Diabetes, prior CVA.  Vascular Interventions: Vasculitis. Performing Technologist: Shona Simpson  Examination Guidelines: Emalea Mix complete evaluation includes at  minimum, Doppler waveform signals and systolic blood pressure reading at the level of bilateral brachial, anterior tibial, and posterior tibial arteries, when vessel segments are accessible. Bilateral testing is considered an integral part of Delshon Blanchfield complete examination. Photoelectric Plethysmograph (PPG) waveforms and toe systolic pressure readings are included as required and additional duplex testing as  17                                tortuous           +----------+--------+--------+--------+------------------+------------------+ ICA  Prox  77      23      1-39%   calcific                             +----------+--------+--------+--------+------------------+------------------+ ICA Mid   106     26                                                   +----------+--------+--------+--------+------------------+------------------+ ICA Distal121     30                                                   +----------+--------+--------+--------+------------------+------------------+ ECA       125     10                                                   +----------+--------+--------+--------+------------------+------------------+ +----------+--------+--------+--------+-------------------+           PSV cm/sEDV cm/sDescribeArm Pressure (mmHG) +----------+--------+--------+--------+-------------------+ [id number]     0                                   +----------+--------+--------+--------+-------------------+ +---------+--------+--+--------+--+ VertebralPSV cm/s75EDV cm/s15 +---------+--------+--+--------+--+   Summary: Right Carotid: Velocities in the right ICA are consistent with Brailyn Delman 1-39% stenosis. Left Carotid: Velocities in the left ICA are consistent with Daymon Hora 1-39% stenosis. Vertebrals:  Bilateral vertebral arteries demonstrate antegrade flow. Subclavians: Normal flow hemodynamics were seen in bilateral subclavian              arteries. *See table(s) above for measurements and observations.  Electronically signed by Heath Lark on 04/13/2023 at 6:45:35 PM.    Final    ECHOCARDIOGRAM COMPLETE  Result Date: 04/13/2023    ECHOCARDIOGRAM REPORT   Patient Name:   DERYK BOZMAN Date of Exam: 04/13/2023 Medical Rec #:  [id number]         Height:       71.0 in Accession #:    [id number]        Weight:       160.0 lb Date of Birth:  1946-07-03        BSA:          1.918 m Patient Age:    76 years          BP:           95/59 mmHg Patient Gender: M                 HR:           82 bpm. Exam Location:  Inpatient  Procedure: 2D Echo, Cardiac Doppler and Color Doppler Indications:    stroke  History:

## 2023-04-15 DIAGNOSIS — I639 Cerebral infarction, unspecified: Secondary | ICD-10-CM | POA: Diagnosis not present

## 2023-04-15 LAB — GLUCOSE, CAPILLARY: Glucose-Capillary: 171 mg/dL — ABNORMAL HIGH (ref 70–99)

## 2023-04-15 LAB — PROTIME-INR
INR: 1.9 — ABNORMAL HIGH (ref 0.8–1.2)
Prothrombin Time: 21.6 s — ABNORMAL HIGH (ref 11.4–15.2)

## 2023-04-15 MED ORDER — APIXABAN 5 MG PO TABS
5.0000 mg | ORAL_TABLET | Freq: Two times a day (BID) | ORAL | Status: DC
  Administered 2023-04-15 – 2023-04-18 (×6): 5 mg via ORAL
  Filled 2023-04-15 (×6): qty 1

## 2023-04-15 NOTE — Plan of Care (Signed)
  Problem: Education: Goal: Knowledge of disease or condition will improve Outcome: Progressing   Problem: Ischemic Stroke/TIA Tissue Perfusion: Goal: Complications of ischemic stroke/TIA will be minimized Outcome: Progressing   Problem: Coping: Goal: Will verbalize positive feelings about self Outcome: Progressing Goal: Will identify appropriate support needs Outcome: Progressing   Problem: Self-Care: Goal: Ability to communicate needs accurately will improve Outcome: Progressing

## 2023-04-15 NOTE — Progress Notes (Signed)
Inpatient Rehab Admissions:  Inpatient Rehab Consult received.  I met with patient at the bedside for rehabilitation assessment and to discuss goals and expectations of an inpatient rehab admission.  Discussed average length of stay, insurance authorization requirement, discharge home after completion of CIR. Also discussed that CIR currently has no beds available. If pt is medically ready for discharge in the next day or so, another rehab venue should be considered. Pt would like to think about  his options and discuss with his wife. Asked AC to follow up on Monday.  Signed: Wolfgang Phoenix, MS, CCC-SLP Admissions Coordinator [phone]

## 2023-04-15 NOTE — Progress Notes (Signed)
PROGRESS NOTE    Matthew Hall  ZOX:[id number] DOB: 08/26/1945 DOA: 04/12/2023 PCP: Matthew Deem, MD  Chief Complaint  Patient presents with   Numbness   Leg Pain    Brief Narrative:    Matthew Hall is Matthew Hall 77 y.o. male with medical history significant for atrial fibrillation, COPD, hypertension, vasculitis, bladder cancer, diabetic neuropathy. Presented to the ED with complaints of increased numbness, and weakness of his right lower extremity.  Reports he has been dragging his right lower extremity for about 2 days.  He has also had to use his wife's rollator to help with ambulation over the past 2 days.  He reports some baseline neuropathy, he denies weakness of his other extremities.  Daughter Matthew Hall is at bedside, patient has not had any facial asymmetry, no slurred speech, no change in vision, no confusion.     Patient was in the ED 8/26, with [facility] rash to his bilateral feet, was discharged on doxycycline for possible tickborne illness, but also prednisone considering his history of vasculitis, which was suspected so as etiology.  Followed up with his rheumatologist at [facility] as recommended -he completed the doxycycline course, he is still taking the prednisone.   ED Course: Blood pressure systolic 102-150s.  INR 3.4 WBC 13.1. UDS positive for benzos, UA clean.  Right lower extremity venous Dopplers negative for DVT. CT negative for acute abnormality Brain MRI-punctate acute infarcts in the right temporal lobe, right basal ganglia, and left frontal white matter.  Query central emboli.  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 MRI was motion degraded but without evidence of large vessel occlusion. EDP asked to neurologist [facility] admission to [facility] need TEE.  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[facility])   Essential hypertension   Paresthesia   Anxiety   COPD without exacerbation ([facility])  Acute CVA (cerebrovascular accident) [facility]) Appreciate neurology assistance - this is likely incidental - > planning for transition coumadin to eliquis once INR <2 (no statin due side effect) MRI brain with punctate acute infarcts in R temporal lobe, right basal ganglia and left frontal white matter (query central emboli) - mild to moderate chronic small vessel ischemic disease with multiple chronic infarcts - motion degraded MRA wihtout evidence of LVO, possible moderate L A1 and severe distal R A2 stenoses Carotid US with 1-39% stenosis bilaterally, antegrade flow in bilateral vertebral arteries Echo with EF 40-45% PT/OT/SLP -> inpatient rehab  LDL 79, A1c 6.9  Neurology follow up with [facility] in 4 weeks  Right Foot Drop Appreciate neurology assistance -suspects common peroneal nerve injury  AFO vs footdrop strap, appreciate therapy assistance Needs outpatient EMG   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  Recent and persistent rash to bilateral feet for which she was seen in the ED 8/26- there was concern for vasculitic etiology during his history, as against tickborne illness as etiology.  CRP 2.9, ESR was 23.  He was started on [facility] course of steroids, and doxycycline.  He followed up with his rheumatologist, he is still taking the steroids.   -Continue prednisone 10 mg daily - sed rate 38, CRP 5.7 -follow outpatient with rheumatology   Atrial fibrillation, chronic ([facility]) EKG currently showing atrial fibrillation.   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 Will ask diabetic coordinator to see - will start metformin while he's here  Essential hypertension Stable.' Continue metoprolol Continue to hold arb and thiazide, bp's on soft side today   Anxiety - Confirms taking Xanax 0.25 mg twice daily, will resume   COPD without exacerbation ([facility]) Stable.  Quit smoking cigarettes about 10 - 12 years ago. -Resume home regimen    DVT prophylaxis: SCD Code Status: full Family Communication: none Disposition:   Status is: Inpatient Remains inpatient appropriate because: need for continued inpatient care   Consultants:  neurology  Procedures:  ABI Summary:  Right: Resting right ankle-brachial index is within normal range. The  right toe-brachial index is abnormal.   Left: Resting left ankle-brachial index is within normal range. The left  toe-brachial index is abnormal.   Carotid US Summary:  Right Carotid: Velocities in the right ICA are consistent with Dayanne Yiu 1-39%  stenosis.   Left Carotid: Velocities in the left ICA are consistent with Fred Franzen 1-39%  stenosis.   Vertebrals: Bilateral vertebral arteries demonstrate antegrade flow.  Subclavians: Normal flow hemodynamics were seen in bilateral subclavian               arteries.   Echo IMPRESSIONS     1. Left ventricular ejection fraction, by estimation, is 40 to 45%. The  left ventricle has mildly decreased function. The left ventricle has no  regional wall motion abnormalities. Left ventricular diastolic parameters  are indeterminate.   2. Moderator band noted in the right ventricle. Right ventricular  systolic function is normal. The right ventricular size is moderately  enlarged.   3. Right atrial size was severely dilated.   4. The mitral valve is normal in structure. No evidence of mitral valve  regurgitation. No evidence of mitral stenosis.   5. Tricuspid valve regurgitation is moderate.   6. The aortic valve is normal in structure. Aortic valve  regurgitation is  not visualized. No aortic stenosis is present.   7. The inferior vena cava is normal in size with greater than 50%  respiratory variability, suggesting right atrial pressure of 3 mmHg.   LE US IMPRESSION: Negative.  Antimicrobials:  Anti-infectives (From admission, onward)    None       Subjective: No new complaints  Objective: Vitals:   04/14/23 2102 04/14/23 2322 04/15/23 0828 04/15/23 1229  BP: 135/65 (!) 100/54 (!) 111/57 114/72  Pulse: 60 61 74 (!) 56  Resp: 20 16 18 17   Temp: 97.7 F (36.5 C) 97.6 F (36.4 C) 98.1 F (36.7 C) 97.8 F (36.6 C)  TempSrc: Axillary Axillary Oral   SpO2: 96% 95% 100% (!) 80%  Weight:      Height:        Intake/Output Summary (Last 24 hours) at 04/15/2023 1504 Last data filed at 04/15/2023 0533 Gross per 24 hour  Intake --  Output 300 ml  Net -300 ml   Filed Weights   04/12/23 0913  Weight: 72.6 kg    Examination:  General: No acute distress. Lungs: unlabored Neurological: Alert and oriented 3. Moves all extremities 4 with equal strength. Cranial nerves II through XII grossly intact. Extremities: No clubbing or cyanosis. No edema.  Data Reviewed: I have  personally reviewed following labs and imaging studies  CBC: Recent Labs  Lab 04/12/23 0939 04/12/23 0948 04/14/23 0424  WBC 13.1*  --  15.5*  NEUTROABS 10.4*  --   --   HGB 14.0 15.6 14.1  HCT 44.3 46.0 43.4  MCV 93.3  --  92.5  PLT 414*  --  391    Basic Metabolic Panel: Recent Labs  Lab 04/12/23 0939 04/12/23 0948 04/14/23 0424  NA 135 137 135  K 3.5 3.7 3.6  CL 96* 98 94*  CO2 28  --  28  GLUCOSE 111* 112* 153*  BUN 27* 30* 30*  CREATININE 1.01 1.00 1.09  CALCIUM 8.9  --  8.9    GFR: Estimated Creatinine Clearance: 57.4 mL/min (by C-G formula based on SCr of 1.09 mg/dL).  Liver Function Tests: Recent Labs  Lab 04/12/23 0939  AST 15  ALT 21  ALKPHOS 48  BILITOT 0.6  PROT 7.0  ALBUMIN 3.0*    CBG: Recent Labs   Lab 04/14/23 0614 04/14/23 1131 04/14/23 1650 04/14/23 2129 04/15/23 0713  GLUCAP 124* 270* 123* 159* 171*     Recent Results (from the past 240 hour(s))  Culture, blood (Routine X 2) w Reflex to ID Panel     Status: None (Preliminary result)   Collection Time: 04/12/23  7:37 PM   Specimen: Right Antecubital; Blood  Result Value Ref Range Status   Specimen Description RIGHT ANTECUBITAL  Final   Special Requests   Final    BOTTLES DRAWN AEROBIC AND ANAEROBIC Blood Culture results may not be optimal due to an excessive volume of blood received in culture bottles   Culture   Final    NO GROWTH 3 DAYS Performed at [facility], [address], [place], [place] [address]    Report Status PENDING  Incomplete  Culture, blood (Routine X 2) w Reflex to ID Panel     Status: None (Preliminary result)   Collection Time: 04/12/23  7:48 PM   Specimen: BLOOD RIGHT FOREARM  Result Value Ref Range Status   Specimen Description BLOOD RIGHT FOREARM  Final   Special Requests   Final    BOTTLES DRAWN AEROBIC AND ANAEROBIC Blood Culture adequate volume   Culture   Final    NO GROWTH 3 DAYS Performed at [facility], [address], [place], [place] [address]    Report Status PENDING  Incomplete         Radiology Studies: No results found.      Scheduled Meds:  ALPRAZolam  0.25 mg Oral BID   apixaban  5 mg Oral BID   metFORMIN  500 mg Oral Q breakfast   metoprolol succinate  25 mg Oral BID   pantoprazole  40 mg Oral Daily   predniSONE  10 mg Oral BID WC   Continuous Infusions:   LOS: 3 days    Time spent: over 30 min    Lacretia Nicks, MD [facility]   To contact the attending provider between 7A-7P or the covering provider during after hours 7P-7A, please log into the web site [email or web address] and access using universal [place] password for that web site. If you do not have the password, please call the Hall operator.  04/15/2023, 3:04 PM

## 2023-04-16 ENCOUNTER — Other Ambulatory Visit (HOSPITAL_COMMUNITY): Payer: Self-pay

## 2023-04-16 DIAGNOSIS — I482 Chronic atrial fibrillation, unspecified: Secondary | ICD-10-CM | POA: Diagnosis not present

## 2023-04-16 DIAGNOSIS — I639 Cerebral infarction, unspecified: Secondary | ICD-10-CM | POA: Diagnosis not present

## 2023-04-16 LAB — CBC
HCT: 41.9 % (ref 39.0–52.0)
Hemoglobin: 13.2 g/dL (ref 13.0–17.0)
MCH: 29.6 pg (ref 26.0–34.0)
MCHC: 31.5 g/dL (ref 30.0–36.0)
MCV: 93.9 fL (ref 80.0–100.0)
Platelets: 383 10*3/uL (ref 150–400)
RBC: 4.46 MIL/uL (ref 4.22–5.81)
RDW: 13.6 % (ref 11.5–15.5)
WBC: 15.3 10*3/uL — ABNORMAL HIGH (ref 4.0–10.5)
nRBC: 0 % (ref 0.0–0.2)

## 2023-04-16 LAB — BASIC METABOLIC PANEL
Anion gap: 8 (ref 5–15)
BUN: 26 mg/dL — ABNORMAL HIGH (ref 8–23)
CO2: 31 mmol/L (ref 22–32)
Calcium: 8.8 mg/dL — ABNORMAL LOW (ref 8.9–10.3)
Chloride: 101 mmol/L (ref 98–111)
Creatinine, Ser: 0.94 mg/dL (ref 0.61–1.24)
GFR, Estimated: >60 mL/min (ref >60)
Glucose, Bld: 93 mg/dL (ref 70–99)
Potassium: 3.6 mmol/L (ref 3.5–5.1)
Sodium: 140 mmol/L (ref 135–145)

## 2023-04-16 LAB — MAGNESIUM: Magnesium: 1.8 mg/dL (ref 1.7–2.4)

## 2023-04-16 LAB — PHOSPHORUS: Phosphorus: 2.8 mg/dL (ref 2.5–4.6)

## 2023-04-16 MED ORDER — GABAPENTIN 100 MG PO CAPS
100.0000 mg | ORAL_CAPSULE | Freq: Every day | ORAL | Status: DC
  Administered 2023-04-16 – 2023-04-17 (×2): 100 mg via ORAL
  Filled 2023-04-16 (×2): qty 1

## 2023-04-16 NOTE — TOC Benefit Eligibility Note (Signed)
Patient Product/process development scientist completed.    The patient is insured through [employer]. Patient has Medicare and is not eligible for a copay card, but may be able to apply for patient assistance, if available.    Ran test claim for Eliquis 5 mg and the current 30 day co-pay is $30.00.   This test claim was processed through [facility]- copay amounts may vary at other pharmacies due to pharmacy/plan contracts, or as the patient moves through the different stages of their insurance plan.     Roland Earl, CPHT Pharmacy Technician III Certified Patient Advocate [facility] Patient Advocate Team Direct Number: [phone]  Fax: [phone]

## 2023-04-16 NOTE — Progress Notes (Signed)
PROGRESS NOTE    [name]  NWG:[id number] DOB: 1945/11/26 DOA: 04/12/2023 PCP: Alinda Deem, MD  Chief Complaint  Patient presents with   Numbness   Leg Pain    Brief Narrative:    Matthew Hall is Matthew Hall 77 y.o. male with medical history significant for atrial fibrillation, COPD, hypertension, vasculitis, bladder cancer, diabetic neuropathy. Presented to the ED with complaints of increased numbness, and weakness of his right lower extremity.  Reports he has been dragging his right lower extremity for about 2 days.  He has also had to use his wife's rollator to help with ambulation over the past 2 days.  He reports some baseline neuropathy, he denies weakness of his other extremities.  Daughter Matthew Hall is at bedside, patient has not had any facial asymmetry, no slurred speech, no change in vision, no confusion.     Patient was in the ED 8/26, with Dalen [place] rash to his bilateral feet, was discharged on doxycycline for possible tickborne illness, but also prednisone considering his history of vasculitis, which was suspected so as etiology.  Followed up with his rheumatologist at [facility] as recommended -he completed the doxycycline course, he is still taking the prednisone.   ED Course: Blood pressure systolic 102-150s.  INR 3.4 WBC 13.1. UDS positive for benzos, UA clean.  Right lower extremity venous Dopplers negative for DVT. CT negative for acute abnormality Brain MRI-punctate acute infarcts in the right temporal lobe, right basal ganglia, and left frontal white matter.  Query central emboli.  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 MRI was motion degraded but without evidence of large vessel occlusion. EDP asked to neurologist Dr. Zenaida Deed admission to [facility] need TEE.  Assessment & Plan:   Principal Problem:   Acute CVA (cerebrovascular accident) [facility]) Active Problems:   Atrial fibrillation, chronic (HCC)    Lymphocytic vasculitis (HCC)   Essential hypertension   Paresthesia   Anxiety   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)  Acute CVA (cerebrovascular accident) [facility]) Appreciate neurology assistance - this is likely incidental - > planning for transition coumadin to eliquis once INR <2 (no statin due side effect) MRI brain with punctate acute infarcts in R temporal lobe, right basal ganglia and left frontal white matter (query central emboli) - mild to moderate chronic small vessel ischemic disease with multiple chronic infarcts - motion degraded MRA wihtout evidence of LVO, possible moderate L A1 and severe distal R A2 stenoses Carotid US with 1-39% stenosis bilaterally, antegrade flow in bilateral vertebral arteries Echo with EF 40-45% PT/OT/SLP -> inpatient rehab  LDL 79, A1c 6.9  Neurology follow up with Dr. Terrace Arabia in 4 weeks  Right Foot Drop Appreciate neurology assistance -suspects common peroneal nerve injury  AFO vs footdrop strap, appreciate therapy assistance Needs outpatient EMG   Neuropathic Pain Trial gabapentin   Lymphocytic vasculitis (HCC) Follows with Dr. Octaviano Glow at [place].  Recent and persistent rash to bilateral feet for which she was seen in the ED 8/26- there was concern for vasculitic etiology during his history, as against tickborne illness as etiology.  CRP 2.9, ESR was 23.  He was started on Hiep Ollis course of steroids, and doxycycline.  He followed up with his rheumatologist, he is still taking the steroids.   -Continue prednisone 10 mg daily - sed rate 38, CRP 5.7 -follow outpatient with rheumatology   Atrial fibrillation, chronic (HCC) EKG currently showing atrial fibrillation.   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 Will ask diabetic coordinator to see - will start metformin while he's here  Essential hypertension Stable.' Continue metoprolol Continue to hold arb and thiazide, bp's on soft side today   Anxiety - Confirms taking Xanax 0.25 mg twice daily, will resume   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) Stable.  Quit smoking cigarettes about 10 - 12 years ago. -Resume home regimen    DVT prophylaxis: SCD Code Status: full Family Communication: none Disposition:   Status is: Inpatient Remains inpatient appropriate because: need for continued inpatient care   Consultants:  neurology  Procedures:  ABI Summary:  Right: Resting right ankle-brachial index is within normal range. The  right toe-brachial index is abnormal.   Left: Resting left ankle-brachial index is within normal range. The left  toe-brachial index is abnormal.   Carotid US Summary:  Right Carotid: Velocities in the right ICA are consistent with Lyndall Bellot 1-39%  stenosis.   Left Carotid: Velocities in the left ICA are consistent with Gaines Cartmell 1-39%  stenosis.   Vertebrals: Bilateral vertebral arteries demonstrate antegrade flow.  Subclavians: Normal flow hemodynamics were seen in bilateral subclavian               arteries.   Echo IMPRESSIONS     1. Left ventricular ejection fraction, by estimation, is 40 to 45%. The  left ventricle has mildly decreased function. The left ventricle has no  regional wall motion abnormalities. Left ventricular diastolic parameters  are indeterminate.   2. Moderator band noted in the right ventricle. Right ventricular  systolic function is normal. The right ventricular size is moderately  enlarged.   3. Right atrial size was severely dilated.   4. The mitral valve is normal in structure. No evidence of mitral valve  regurgitation. No evidence of mitral stenosis.   5. Tricuspid valve regurgitation is moderate.   6. The  aortic valve is normal in structure. Aortic valve regurgitation is  not visualized. No aortic stenosis is present.   7. The inferior vena cava is normal in size with greater than 50%  respiratory variability, suggesting right atrial pressure of 3 mmHg.   LE US IMPRESSION: Negative.  Antimicrobials:  Anti-infectives (From admission, onward)    None       Subjective: No new complaints  Objective: Vitals:   04/16/23 0342 04/16/23 0803 04/16/23 1237 04/16/23 1531  BP: 117/66 (!) 114/59 133/61 (!) 118/53  Pulse: 63 71 62 71  Resp: 18 18 18 18   Temp: 97.7 F (36.5 C) 97.9 F (36.6 C) 98.2 F (36.8 C) 98.4 F (36.9 C)  TempSrc: Oral Oral Oral Oral  SpO2: 97% 99% 98% 98%  Weight:      Height:        Intake/Output Summary (Last 24 hours) at 04/16/2023 1606 Last data filed at 04/16/2023 1500 Gross per 24 hour  Intake 690 ml  Output 450 ml  Net 240 ml   Filed Weights   04/12/23 0913  Weight: 72.6 kg    Examination:  General: No acute distress. Cardiovascular: RRR Lungs: unlabored Neurological: weakness with dorsiflexion Extremities: No clubbing or cyanosis. No edema.   Data Reviewed: I have personally reviewed following labs and imaging studies  CBC: Recent Labs  Lab 04/12/23 0939 04/12/23 0948 04/14/23 0424 04/16/23 0754  WBC 13.1*  --  15.5* 15.3*  NEUTROABS 10.4*  --   --   --   HGB 14.0 15.6 14.1 13.2  HCT 44.3 46.0 43.4 41.9  MCV 93.3  --  92.5 93.9  PLT 414*  --  391 383    Basic Metabolic Panel: Recent Labs  Lab 04/12/23 0939 04/12/23 0948 04/14/23 0424 04/16/23 0754  NA 135 137 135 140  K 3.5 3.7 3.6 3.6  CL 96* 98 94* 101  CO2 28  --  28 31  GLUCOSE 111* 112* 153* 93  BUN 27* 30* 30* 26*  CREATININE 1.01 1.00 1.09 0.94  CALCIUM 8.9  --  8.9 8.8*  MG  --   --   --  1.8  PHOS  --   --   --  2.8    GFR: Estimated Creatinine Clearance: 66.5 mL/min (by C-G formula based on SCr of 0.94 mg/dL).  Liver Function Tests: Recent Labs   Lab 04/12/23 0939  AST 15  ALT 21  ALKPHOS 48  BILITOT 0.6  PROT 7.0  ALBUMIN 3.0*    CBG: Recent Labs  Lab 04/14/23 0614 04/14/23 1131 04/14/23 1650 04/14/23 2129 04/15/23 0713  GLUCAP 124* 270* 123* 159* 171*     Recent Results (from the past 240 hour(s))  Culture, blood (Routine X 2) w Reflex to ID Panel     Status: None (Preliminary result)   Collection Time: 04/12/23  7:37 PM   Specimen: Right Antecubital; Blood  Result Value Ref Range Status   Specimen Description RIGHT ANTECUBITAL  Final   Special Requests   Final    BOTTLES DRAWN AEROBIC AND ANAEROBIC Blood Culture results may not be optimal due to an excessive volume of blood received in culture bottles   Culture   Final    NO GROWTH 4 DAYS Performed at [facility], [address] [place], [place] [address]    Report Status PENDING  Incomplete  Culture, blood (Routine X 2) w Reflex to ID Panel     Status: None (Preliminary result)   Collection Time: 04/12/23  7:48 PM   Specimen: BLOOD RIGHT FOREARM  Result Value Ref Range Status   Specimen Description BLOOD RIGHT FOREARM  Final   Special Requests   Final    BOTTLES DRAWN AEROBIC AND ANAEROBIC Blood Culture adequate volume   Culture   Final    NO GROWTH 4 DAYS Performed at [facility], [address] [place], [place] [address]    Report Status PENDING  Incomplete         Radiology Studies: No results found.      Scheduled Meds:  ALPRAZolam  0.25 mg Oral BID   apixaban  5 mg Oral BID   gabapentin  100 mg Oral QHS   metFORMIN  500 mg Oral Q breakfast   metoprolol succinate  25 mg Oral BID   pantoprazole  40 mg Oral Daily   predniSONE  10 mg Oral BID WC   Continuous Infusions:   LOS: 4 days    Time spent: over 30 min    Lacretia Nicks, MD [facility]   To contact the attending provider between 7A-7P or the covering provider during after hours 7P-7A, please log into the web site [email or web address] and access using universal  [place] password for that web site. If you do not have the password, please call the hospital operator.  04/16/2023, 4:06 PM

## 2023-04-16 NOTE — Discharge Instructions (Signed)

## 2023-04-16 NOTE — Care Management Important Message (Signed)
Important Message  Patient Details  Name: Matthew [facility] MRN: [id number] Date of Birth: [birth year]   Important Message Given:  Yes - Medicare IM     Dorena Bodo 04/16/2023, 2:18 PM

## 2023-04-16 NOTE — Progress Notes (Signed)
Inpatient Rehab Admissions Coordinator:  Saw pt at bedside. Discussed lack of bed availability in CIR. He acknowledged understanding and is agreeable to another IPR. TOC aware and following.   Matthew Phoenix, MS, CCC-SLP Admissions Coordinator [phone]

## 2023-04-16 NOTE — TOC Progression Note (Addendum)
Transition of Care [facility]) - Progression Note    Patient Details  Name: KAHNER YANIK MRN: [id number] Date of Birth: [birth year]  Transition of Care [facility]) CM/SW Contact  Kermit Balo, RN Phone Number: 04/16/2023, 10:42 AM  Clinical Narrative:     CM met with the patient and his daughter at the bedside. They are willing to attend anther IR since [facility]'s IR is full. They asked to have his information sent to [facility]. CM called [facility] with their admissions and faxed the referral. TOC following.  1326: [facility] has offered a bed and will start insurance once new therapy notes entered.   Expected Discharge Plan: IP Rehab Facility Barriers to Discharge: Continued Medical Work up  Expected Discharge Plan and Services   Discharge Planning Services: CM Consult Post Acute Care Choice: IP Rehab Living arrangements for the past 2 months: Single Family Home                                       Social Determinants of Health (SDOH) Interventions SDOH Screenings   Depression (PHQ2-9): Low Risk  (05/04/2019)  Tobacco Use: Medium Risk (04/12/2023)    Readmission Risk Interventions     No data to display

## 2023-04-16 NOTE — Consult Note (Addendum)
as noted.  ABI Findings: +---------+------------------+-----+-------------------+--------+ Right    Rt Pressure (mmHg)IndexWaveform           Comment  +---------+------------------+-----+-------------------+--------+ Brachial 111                    triphasic                   +---------+------------------+-----+-------------------+--------+ PTA      98                0.88 dampened monophasic         +---------+------------------+-----+-------------------+--------+ DP       110               0.99 biphasic                    +---------+------------------+-----+-------------------+--------+ Great Toe66                0.59 Normal                      +---------+------------------+-----+-------------------+--------+  +---------+------------------+-----+---------+-------+ Left     Lt Pressure (mmHg)IndexWaveform Comment +---------+------------------+-----+---------+-------+ Brachial 109                    triphasic        +---------+------------------+-----+---------+-------+ PTA      115               1.04 triphasic        +---------+------------------+-----+---------+-------+ DP       107               0.96 biphasic         +---------+------------------+-----+---------+-------+ Great Toe0                 0.00 Absent           +---------+------------------+-----+---------+-------+ +-------+-----------+-----------+------------+------------+ ABI/TBIToday's ABIToday's TBIPrevious ABIPrevious TBI +-------+-----------+-----------+------------+------------+ Right  0.99       0.59                                +-------+-----------+-----------+------------+------------+ Left   1.04       0                                   +-------+-----------+-----------+------------+------------+  Summary: Right: Resting right ankle-brachial index is within normal range. The right toe-brachial index is abnormal. Left: Resting left ankle-brachial index is within normal range. The left toe-brachial index is abnormal. *See table(s) above for measurements and observations.  Electronically signed by Heath Lark on 04/13/2023 at 6:49:39 PM.    Final    VAS US CAROTID  Result Date: 04/13/2023 Carotid Arterial Duplex Study Patient Name:  Matthew Hall  Date of Exam:   04/13/2023 Medical Rec #: [id number]          Accession #:    [id number] Date of Birth: Aug 29, 1945         Patient Gender: M Patient Age:   77 years Exam Location:  [facility] Procedure:      VAS US CAROTID Referring Phys: Kennis Carina --------------------------------------------------------------------------------  Indications:   CVA. Risk Factors:  Hypertension, Diabetes, prior CVA. Other Factors: Vasculitis. Performing  Technologist: Shona Simpson  Examination Guidelines: A complete evaluation includes B-mode imaging, spectral Doppler, color Doppler, and power Doppler as needed of all accessible  as noted.  ABI Findings: +---------+------------------+-----+-------------------+--------+ Right    Rt Pressure (mmHg)IndexWaveform           Comment  +---------+------------------+-----+-------------------+--------+ Brachial 111                    triphasic                   +---------+------------------+-----+-------------------+--------+ PTA      98                0.88 dampened monophasic         +---------+------------------+-----+-------------------+--------+ DP       110               0.99 biphasic                    +---------+------------------+-----+-------------------+--------+ Great Toe66                0.59 Normal                      +---------+------------------+-----+-------------------+--------+  +---------+------------------+-----+---------+-------+ Left     Lt Pressure (mmHg)IndexWaveform Comment +---------+------------------+-----+---------+-------+ Brachial 109                    triphasic        +---------+------------------+-----+---------+-------+ PTA      115               1.04 triphasic        +---------+------------------+-----+---------+-------+ DP       107               0.96 biphasic         +---------+------------------+-----+---------+-------+ Great Toe0                 0.00 Absent           +---------+------------------+-----+---------+-------+ +-------+-----------+-----------+------------+------------+ ABI/TBIToday's ABIToday's TBIPrevious ABIPrevious TBI +-------+-----------+-----------+------------+------------+ Right  0.99       0.59                                +-------+-----------+-----------+------------+------------+ Left   1.04       0                                   +-------+-----------+-----------+------------+------------+  Summary: Right: Resting right ankle-brachial index is within normal range. The right toe-brachial index is abnormal. Left: Resting left ankle-brachial index is within normal range. The left toe-brachial index is abnormal. *See table(s) above for measurements and observations.  Electronically signed by Heath Lark on 04/13/2023 at 6:49:39 PM.    Final    VAS US CAROTID  Result Date: 04/13/2023 Carotid Arterial Duplex Study Patient Name:  Matthew Hall  Date of Exam:   04/13/2023 Medical Rec #: [id number]          Accession #:    [id number] Date of Birth: Aug 29, 1945         Patient Gender: M Patient Age:   77 years Exam Location:  [facility] Procedure:      VAS US CAROTID Referring Phys: Kennis Carina --------------------------------------------------------------------------------  Indications:   CVA. Risk Factors:  Hypertension, Diabetes, prior CVA. Other Factors: Vasculitis. Performing  Technologist: Shona Simpson  Examination Guidelines: A complete evaluation includes B-mode imaging, spectral Doppler, color Doppler, and power Doppler as needed of all accessible  has no  regional wall motion abnormalities. Left  ventricular diastolic parameters  are indeterminate.   2. Moderator band noted in the right ventricle. Right ventricular  systolic function is normal. The right ventricular size is moderately  enlarged.   3. Right atrial size was severely dilated.   4. The mitral valve is normal in structure. No evidence of mitral valve  regurgitation. No evidence of mitral stenosis.   5. Tricuspid valve regurgitation is moderate.   6. The aortic valve is normal in structure. Aortic valve regurgitation is  not visualized. No aortic stenosis is present.   7. The inferior vena cava is normal in size with greater than 50%  respiratory variability, suggesting right atrial pressure of 3 mmHg.   Laboratory Data:  High Sensitivity Troponin:  No results for input(s): "TROPONINIHS" in the last 720 hours.   Chemistry Recent Labs  Lab 04/12/23 0939 04/12/23 0948 04/14/23 0424 04/16/23 0754  NA 135 137 135 140  K 3.5 3.7 3.6 3.6  CL 96* 98 94* 101  CO2 28  --  28 31  GLUCOSE 111* 112* 153* 93  BUN 27* 30* 30* 26*  CREATININE 1.01 1.00 1.09 0.94  CALCIUM 8.9  --  8.9 8.8*  MG  --   --   --  1.8  GFRNONAA >60  --  >60 >60  ANIONGAP 11  --  13 8    Recent Labs  Lab 04/12/23 0939  PROT 7.0  ALBUMIN 3.0*  AST 15  ALT 21  ALKPHOS 48  BILITOT 0.6   Lipids  Recent Labs  Lab 04/13/23 0713  CHOL 135  TRIG 79  HDL 40*  LDLCALC 79  CHOLHDL 3.4    Hematology Recent Labs  Lab 04/12/23 0939 04/12/23 0948 04/14/23 0424 04/16/23 0754  WBC 13.1*  --  15.5* 15.3*  RBC 4.75  --  4.69 4.46  HGB 14.0 15.6 14.1 13.2  HCT 44.3 46.0 43.4 41.9  MCV 93.3  --  92.5 93.9  MCH 29.5  --  30.1 29.6  MCHC 31.6  --  32.5 31.5  RDW 13.5  --  13.5 13.6  PLT 414*  --  391 383   Thyroid No results for input(s): "TSH", "FREET4" in the last 168 hours.  BNPNo results for input(s): "BNP", "PROBNP" in the last 168 hours.  DDimer No results for input(s): "DDIMER" in the last 168 hours.   Radiology/Studies:   VAS [place] ABI WITH/WO TBI  Result Date: 04/13/2023  LOWER EXTREMITY DOPPLER STUDY Patient Name:  Matthew Hall  Date of Exam:   04/13/2023 Medical Rec #: [id number]          Accession #:    [id number] Date of Birth: 08-23-1945         Patient Gender: M Patient Age:   49 years Exam Location:  [facility] Procedure:      VAS [place] ABI WITH/WO TBI Referring Phys: A POWELL JR --------------------------------------------------------------------------------  Indications: Diminished pulses High Risk Factors: Hypertension, Diabetes, prior CVA.  Vascular Interventions: Vasculitis. Performing Technologist: Shona Simpson  Examination Guidelines: A complete evaluation includes at minimum, Doppler waveform signals and systolic blood pressure reading at the level of bilateral brachial, anterior tibial, and posterior tibial arteries, when vessel segments are accessible. Bilateral testing is considered an integral part of a complete examination. Photoelectric Plethysmograph (PPG) waveforms and toe systolic pressure readings are included as required and additional duplex testing as needed. Limited examinations for reoccurring indications may be performed  as noted.  ABI Findings: +---------+------------------+-----+-------------------+--------+ Right    Rt Pressure (mmHg)IndexWaveform           Comment  +---------+------------------+-----+-------------------+--------+ Brachial 111                    triphasic                   +---------+------------------+-----+-------------------+--------+ PTA      98                0.88 dampened monophasic         +---------+------------------+-----+-------------------+--------+ DP       110               0.99 biphasic                    +---------+------------------+-----+-------------------+--------+ Great Toe66                0.59 Normal                      +---------+------------------+-----+-------------------+--------+  +---------+------------------+-----+---------+-------+ Left     Lt Pressure (mmHg)IndexWaveform Comment +---------+------------------+-----+---------+-------+ Brachial 109                    triphasic        +---------+------------------+-----+---------+-------+ PTA      115               1.04 triphasic        +---------+------------------+-----+---------+-------+ DP       107               0.96 biphasic         +---------+------------------+-----+---------+-------+ Great Toe0                 0.00 Absent           +---------+------------------+-----+---------+-------+ +-------+-----------+-----------+------------+------------+ ABI/TBIToday's ABIToday's TBIPrevious ABIPrevious TBI +-------+-----------+-----------+------------+------------+ Right  0.99       0.59                                +-------+-----------+-----------+------------+------------+ Left   1.04       0                                   +-------+-----------+-----------+------------+------------+  Summary: Right: Resting right ankle-brachial index is within normal range. The right toe-brachial index is abnormal. Left: Resting left ankle-brachial index is within normal range. The left toe-brachial index is abnormal. *See table(s) above for measurements and observations.  Electronically signed by Heath Lark on 04/13/2023 at 6:49:39 PM.    Final    VAS US CAROTID  Result Date: 04/13/2023 Carotid Arterial Duplex Study Patient Name:  Matthew Hall  Date of Exam:   04/13/2023 Medical Rec #: [id number]          Accession #:    [id number] Date of Birth: Aug 29, 1945         Patient Gender: M Patient Age:   77 years Exam Location:  [facility] Procedure:      VAS US CAROTID Referring Phys: Kennis Carina --------------------------------------------------------------------------------  Indications:   CVA. Risk Factors:  Hypertension, Diabetes, prior CVA. Other Factors: Vasculitis. Performing  Technologist: Shona Simpson  Examination Guidelines: A complete evaluation includes B-mode imaging, spectral Doppler, color Doppler, and power Doppler as needed of all accessible  tortuous           +----------+--------+--------+--------+------------------+------------------+ ICA Prox  77      23      1-39%   calcific                             +----------+--------+--------+--------+------------------+------------------+ ICA Mid   106     26                                                   +----------+--------+--------+--------+------------------+------------------+ ICA Distal121     30                                                   +----------+--------+--------+--------+------------------+------------------+ ECA       125     10                                                   +----------+--------+--------+--------+------------------+------------------+ +----------+--------+--------+--------+-------------------+           PSV cm/sEDV cm/sDescribeArm Pressure (mmHG) +----------+--------+--------+--------+-------------------+ [id number]     0                                   +----------+--------+--------+--------+-------------------+ +---------+--------+--+--------+--+ VertebralPSV cm/s75EDV cm/s15 +---------+--------+--+--------+--+   Summary: Right Carotid: Velocities in the right ICA are consistent with a 1-39% stenosis. Left Carotid: Velocities in the left ICA are consistent with a 1-39% stenosis.  Vertebrals:  Bilateral vertebral arteries demonstrate antegrade flow. Subclavians: Normal flow hemodynamics were seen in bilateral subclavian              arteries. *See table(s) above for measurements and observations.  Electronically signed by Heath Lark on 04/13/2023 at 6:45:35 PM.    Final    ECHOCARDIOGRAM COMPLETE  Result Date: 04/13/2023    ECHOCARDIOGRAM REPORT   Patient Name:   Matthew Hall Date of Exam: 04/13/2023 Medical Rec #:  [id number]         Height:       71.0 in Accession #:    [id number]        Weight:       160.0 lb Date of Birth:  [birth year]        BSA:          1.918 m Patient Age:    76 years          BP:           95/59 mmHg Patient Gender: M                 HR:           82 bpm. Exam Location:  Inpatient Procedure: 2D Echo, Cardiac Doppler and Color Doppler Indications:    stroke  History:        Patient has no prior history of Echocardiogram examinations.                 Stroke and COPD, Arrythmias:Atrial Fibrillation;  tortuous           +----------+--------+--------+--------+------------------+------------------+ ICA Prox  77      23      1-39%   calcific                             +----------+--------+--------+--------+------------------+------------------+ ICA Mid   106     26                                                   +----------+--------+--------+--------+------------------+------------------+ ICA Distal121     30                                                   +----------+--------+--------+--------+------------------+------------------+ ECA       125     10                                                   +----------+--------+--------+--------+------------------+------------------+ +----------+--------+--------+--------+-------------------+           PSV cm/sEDV cm/sDescribeArm Pressure (mmHG) +----------+--------+--------+--------+-------------------+ [id number]     0                                   +----------+--------+--------+--------+-------------------+ +---------+--------+--+--------+--+ VertebralPSV cm/s75EDV cm/s15 +---------+--------+--+--------+--+   Summary: Right Carotid: Velocities in the right ICA are consistent with a 1-39% stenosis. Left Carotid: Velocities in the left ICA are consistent with a 1-39% stenosis.  Vertebrals:  Bilateral vertebral arteries demonstrate antegrade flow. Subclavians: Normal flow hemodynamics were seen in bilateral subclavian              arteries. *See table(s) above for measurements and observations.  Electronically signed by Heath Lark on 04/13/2023 at 6:45:35 PM.    Final    ECHOCARDIOGRAM COMPLETE  Result Date: 04/13/2023    ECHOCARDIOGRAM REPORT   Patient Name:   Matthew Hall Date of Exam: 04/13/2023 Medical Rec #:  [id number]         Height:       71.0 in Accession #:    [id number]        Weight:       160.0 lb Date of Birth:  [birth year]        BSA:          1.918 m Patient Age:    76 years          BP:           95/59 mmHg Patient Gender: M                 HR:           82 bpm. Exam Location:  Inpatient Procedure: 2D Echo, Cardiac Doppler and Color Doppler Indications:    stroke  History:        Patient has no prior history of Echocardiogram examinations.                 Stroke and COPD, Arrythmias:Atrial Fibrillation;  has no  regional wall motion abnormalities. Left  ventricular diastolic parameters  are indeterminate.   2. Moderator band noted in the right ventricle. Right ventricular  systolic function is normal. The right ventricular size is moderately  enlarged.   3. Right atrial size was severely dilated.   4. The mitral valve is normal in structure. No evidence of mitral valve  regurgitation. No evidence of mitral stenosis.   5. Tricuspid valve regurgitation is moderate.   6. The aortic valve is normal in structure. Aortic valve regurgitation is  not visualized. No aortic stenosis is present.   7. The inferior vena cava is normal in size with greater than 50%  respiratory variability, suggesting right atrial pressure of 3 mmHg.   Laboratory Data:  High Sensitivity Troponin:  No results for input(s): "TROPONINIHS" in the last 720 hours.   Chemistry Recent Labs  Lab 04/12/23 0939 04/12/23 0948 04/14/23 0424 04/16/23 0754  NA 135 137 135 140  K 3.5 3.7 3.6 3.6  CL 96* 98 94* 101  CO2 28  --  28 31  GLUCOSE 111* 112* 153* 93  BUN 27* 30* 30* 26*  CREATININE 1.01 1.00 1.09 0.94  CALCIUM 8.9  --  8.9 8.8*  MG  --   --   --  1.8  GFRNONAA >60  --  >60 >60  ANIONGAP 11  --  13 8    Recent Labs  Lab 04/12/23 0939  PROT 7.0  ALBUMIN 3.0*  AST 15  ALT 21  ALKPHOS 48  BILITOT 0.6   Lipids  Recent Labs  Lab 04/13/23 0713  CHOL 135  TRIG 79  HDL 40*  LDLCALC 79  CHOLHDL 3.4    Hematology Recent Labs  Lab 04/12/23 0939 04/12/23 0948 04/14/23 0424 04/16/23 0754  WBC 13.1*  --  15.5* 15.3*  RBC 4.75  --  4.69 4.46  HGB 14.0 15.6 14.1 13.2  HCT 44.3 46.0 43.4 41.9  MCV 93.3  --  92.5 93.9  MCH 29.5  --  30.1 29.6  MCHC 31.6  --  32.5 31.5  RDW 13.5  --  13.5 13.6  PLT 414*  --  391 383   Thyroid No results for input(s): "TSH", "FREET4" in the last 168 hours.  BNPNo results for input(s): "BNP", "PROBNP" in the last 168 hours.  DDimer No results for input(s): "DDIMER" in the last 168 hours.   Radiology/Studies:   VAS [place] ABI WITH/WO TBI  Result Date: 04/13/2023  LOWER EXTREMITY DOPPLER STUDY Patient Name:  Matthew Hall  Date of Exam:   04/13/2023 Medical Rec #: [id number]          Accession #:    [id number] Date of Birth: 08-23-1945         Patient Gender: M Patient Age:   49 years Exam Location:  [facility] Procedure:      VAS [place] ABI WITH/WO TBI Referring Phys: A POWELL JR --------------------------------------------------------------------------------  Indications: Diminished pulses High Risk Factors: Hypertension, Diabetes, prior CVA.  Vascular Interventions: Vasculitis. Performing Technologist: Shona Simpson  Examination Guidelines: A complete evaluation includes at minimum, Doppler waveform signals and systolic blood pressure reading at the level of bilateral brachial, anterior tibial, and posterior tibial arteries, when vessel segments are accessible. Bilateral testing is considered an integral part of a complete examination. Photoelectric Plethysmograph (PPG) waveforms and toe systolic pressure readings are included as required and additional duplex testing as needed. Limited examinations for reoccurring indications may be performed  has no  regional wall motion abnormalities. Left  ventricular diastolic parameters  are indeterminate.   2. Moderator band noted in the right ventricle. Right ventricular  systolic function is normal. The right ventricular size is moderately  enlarged.   3. Right atrial size was severely dilated.   4. The mitral valve is normal in structure. No evidence of mitral valve  regurgitation. No evidence of mitral stenosis.   5. Tricuspid valve regurgitation is moderate.   6. The aortic valve is normal in structure. Aortic valve regurgitation is  not visualized. No aortic stenosis is present.   7. The inferior vena cava is normal in size with greater than 50%  respiratory variability, suggesting right atrial pressure of 3 mmHg.   Laboratory Data:  High Sensitivity Troponin:  No results for input(s): "TROPONINIHS" in the last 720 hours.   Chemistry Recent Labs  Lab 04/12/23 0939 04/12/23 0948 04/14/23 0424 04/16/23 0754  NA 135 137 135 140  K 3.5 3.7 3.6 3.6  CL 96* 98 94* 101  CO2 28  --  28 31  GLUCOSE 111* 112* 153* 93  BUN 27* 30* 30* 26*  CREATININE 1.01 1.00 1.09 0.94  CALCIUM 8.9  --  8.9 8.8*  MG  --   --   --  1.8  GFRNONAA >60  --  >60 >60  ANIONGAP 11  --  13 8    Recent Labs  Lab 04/12/23 0939  PROT 7.0  ALBUMIN 3.0*  AST 15  ALT 21  ALKPHOS 48  BILITOT 0.6   Lipids  Recent Labs  Lab 04/13/23 0713  CHOL 135  TRIG 79  HDL 40*  LDLCALC 79  CHOLHDL 3.4    Hematology Recent Labs  Lab 04/12/23 0939 04/12/23 0948 04/14/23 0424 04/16/23 0754  WBC 13.1*  --  15.5* 15.3*  RBC 4.75  --  4.69 4.46  HGB 14.0 15.6 14.1 13.2  HCT 44.3 46.0 43.4 41.9  MCV 93.3  --  92.5 93.9  MCH 29.5  --  30.1 29.6  MCHC 31.6  --  32.5 31.5  RDW 13.5  --  13.5 13.6  PLT 414*  --  391 383   Thyroid No results for input(s): "TSH", "FREET4" in the last 168 hours.  BNPNo results for input(s): "BNP", "PROBNP" in the last 168 hours.  DDimer No results for input(s): "DDIMER" in the last 168 hours.   Radiology/Studies:   VAS [place] ABI WITH/WO TBI  Result Date: 04/13/2023  LOWER EXTREMITY DOPPLER STUDY Patient Name:  Matthew Hall  Date of Exam:   04/13/2023 Medical Rec #: [id number]          Accession #:    [id number] Date of Birth: 08-23-1945         Patient Gender: M Patient Age:   49 years Exam Location:  [facility] Procedure:      VAS [place] ABI WITH/WO TBI Referring Phys: A POWELL JR --------------------------------------------------------------------------------  Indications: Diminished pulses High Risk Factors: Hypertension, Diabetes, prior CVA.  Vascular Interventions: Vasculitis. Performing Technologist: Shona Simpson  Examination Guidelines: A complete evaluation includes at minimum, Doppler waveform signals and systolic blood pressure reading at the level of bilateral brachial, anterior tibial, and posterior tibial arteries, when vessel segments are accessible. Bilateral testing is considered an integral part of a complete examination. Photoelectric Plethysmograph (PPG) waveforms and toe systolic pressure readings are included as required and additional duplex testing as needed. Limited examinations for reoccurring indications may be performed  tortuous           +----------+--------+--------+--------+------------------+------------------+ ICA Prox  77      23      1-39%   calcific                             +----------+--------+--------+--------+------------------+------------------+ ICA Mid   106     26                                                   +----------+--------+--------+--------+------------------+------------------+ ICA Distal121     30                                                   +----------+--------+--------+--------+------------------+------------------+ ECA       125     10                                                   +----------+--------+--------+--------+------------------+------------------+ +----------+--------+--------+--------+-------------------+           PSV cm/sEDV cm/sDescribeArm Pressure (mmHG) +----------+--------+--------+--------+-------------------+ [id number]     0                                   +----------+--------+--------+--------+-------------------+ +---------+--------+--+--------+--+ VertebralPSV cm/s75EDV cm/s15 +---------+--------+--+--------+--+   Summary: Right Carotid: Velocities in the right ICA are consistent with a 1-39% stenosis. Left Carotid: Velocities in the left ICA are consistent with a 1-39% stenosis.  Vertebrals:  Bilateral vertebral arteries demonstrate antegrade flow. Subclavians: Normal flow hemodynamics were seen in bilateral subclavian              arteries. *See table(s) above for measurements and observations.  Electronically signed by Heath Lark on 04/13/2023 at 6:45:35 PM.    Final    ECHOCARDIOGRAM COMPLETE  Result Date: 04/13/2023    ECHOCARDIOGRAM REPORT   Patient Name:   Matthew Hall Date of Exam: 04/13/2023 Medical Rec #:  [id number]         Height:       71.0 in Accession #:    [id number]        Weight:       160.0 lb Date of Birth:  [birth year]        BSA:          1.918 m Patient Age:    76 years          BP:           95/59 mmHg Patient Gender: M                 HR:           82 bpm. Exam Location:  Inpatient Procedure: 2D Echo, Cardiac Doppler and Color Doppler Indications:    stroke  History:        Patient has no prior history of Echocardiogram examinations.                 Stroke and COPD, Arrythmias:Atrial Fibrillation;

## 2023-04-16 NOTE — Progress Notes (Signed)
Physical Therapy Treatment  Patient Details Name: Matthew Hall MRN: [id number] DOB: March 03, 1946 Today's Date: 04/16/2023   History of Present Illness Pt is a 77 y/o male who presents 04/12/2023 to [facility] for RLE weakness, numbness, pain. 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 PMH significant for a-fib, COPD, HTN, vasculitis, DM, diabetic neuropathy, bladder CA.    PT Comments  Pt progressing towards physical therapy goals. Attempted with toe lifter strap and pt with improved gait at this time. Recommend AFO when able to be fitted for one. Continue to recommend post-acute rehab to maximize functional independence, safety, and return to PLOF. Will continue to follow.     If plan is discharge home, recommend the following: A little help with walking and/or transfers;A little help with bathing/dressing/bathroom;Assistance with cooking/housework;Assist for transportation;Help with stairs or ramp for entrance   Can travel by private vehicle        Equipment Recommendations  None recommended by PT    Recommendations for Other Services       Precautions / Restrictions Precautions Precautions: Fall Precaution Comments: Hypersensitivity in lower legs and feet R>L Restrictions Weight Bearing Restrictions: No     Mobility  Bed Mobility Overal bed mobility: Needs Assistance Bed Mobility: Supine to Sit, Sit to Supine     Supine to sit: Supervision     General bed mobility comments: HOB slightly elevated and use of rails required.    Transfers Overall transfer level: Needs assistance Equipment used: Rolling walker (2 wheels) Transfers: Sit to/from Stand Sit to Stand: Contact guard assist, Min assist           General transfer comment: VC's for hand placement on seated surface for safety. Min assist for power up and to gain/maintain standing balance. CGA for stand>sit.     Ambulation/Gait Ambulation/Gait assistance: Contact guard assist, Min assist Gait Distance (Feet): 200 Feet Assistive device: Rolling walker (2 wheels) Gait Pattern/deviations: Step-through pattern, Decreased stride length, Trunk flexed, Decreased dorsiflexion - right Gait velocity: Decreased Gait velocity interpretation: 1.31 - 2.62 ft/sec, indicative of limited community ambulator   General Gait Details: VC's for improved posture, closer walker proximity, and forward gaze. Assist intermittently for walker management. Utilized a toe lifter strap and overall gait improved with even step/stride length R and L, and increased heel strike. Pt reports pain in heel with weight bearing unchanged from last session.   Stairs             Wheelchair Mobility     Tilt Bed    Modified Rankin (Stroke Patients Only)       Balance Overall balance assessment: Needs assistance Sitting-balance support: Feet supported, Single extremity supported Sitting balance-Leahy Scale: Good     Standing balance support: No upper extremity supported Standing balance-Leahy Scale: Fair                              Cognition Arousal: Alert Behavior During Therapy: WFL for tasks assessed/performed Overall Cognitive Status: No family/caregiver present to determine baseline cognitive functioning                                          Exercises      General Comments General comments (skin integrity, edema, etc.): lesions present on  ankles and feet      Pertinent Vitals/Pain Pain Assessment Pain Assessment: Faces Faces Pain Scale: Hurts even more Pain Location: lower legs and feet R>L Pain Descriptors / Indicators: Burning Pain Intervention(s): Limited activity within patient's tolerance, Monitored during session, Repositioned    Home Living                          Prior Function            PT Goals (current goals can now be found in the care  plan section) Acute Rehab PT Goals Patient Stated Goal: Be able to return home PT Goal Formulation: With patient Time For Goal Achievement: 04/27/23 Potential to Achieve Goals: Good Progress towards PT goals: Progressing toward goals    Frequency    Min 1X/week      PT Plan      Co-evaluation              AM-PAC PT "6 Clicks" Mobility   Outcome Measure  Help needed turning from your back to your side while in a flat bed without using bedrails?: A Little Help needed moving from lying on your back to sitting on the side of a flat bed without using bedrails?: A Little Help needed moving to and from a bed to a chair (including a wheelchair)?: A Little Help needed standing up from a chair using your arms (e.g., wheelchair or bedside chair)?: A Little Help needed to walk in hospital room?: A Little Help needed climbing 3-5 steps with a railing? : A Little 6 Click Score: 18    End of Session Equipment Utilized During Treatment: Gait belt Activity Tolerance: Patient tolerated treatment well Patient left: in bed Nurse Communication: Mobility status PT Visit Diagnosis: Unsteadiness on feet (R26.81);Pain Pain - Right/Left: Right (bilateral) Pain - part of body: Leg;Ankle and joints of foot     Time: 1610-9604 PT Time Calculation (min) (ACUTE ONLY): 25 min  Charges:    $Gait Training: 23-37 mins PT General Charges $$ ACUTE PT VISIT: 1 Visit                     Matthew Hall, PT, DPT Acute Rehabilitation Services Secure Chat Preferred Office: [phone]    Matthew Hall 04/16/2023, 2:57 PM

## 2023-04-17 ENCOUNTER — Telehealth: Payer: Self-pay

## 2023-04-17 DIAGNOSIS — I639 Cerebral infarction, unspecified: Secondary | ICD-10-CM | POA: Diagnosis not present

## 2023-04-17 LAB — CULTURE, BLOOD (ROUTINE X 2)
Culture: NO GROWTH
Culture: NO GROWTH
Special Requests: ADEQUATE

## 2023-04-17 MED ORDER — METFORMIN HCL 500 MG PO TABS: 500.0000 mg | ORAL_TABLET | Freq: Every day | ORAL | Status: DC

## 2023-04-17 MED ORDER — APIXABAN 5 MG PO TABS: 5.0000 mg | ORAL_TABLET | Freq: Two times a day (BID) | ORAL | Status: DC

## 2023-04-17 MED ORDER — GABAPENTIN 100 MG PO CAPS: 100.0000 mg | ORAL_CAPSULE | Freq: Every day | ORAL | Status: DC

## 2023-04-17 NOTE — Telephone Encounter (Signed)
See patient first before EMG

## 2023-04-17 NOTE — TOC Progression Note (Signed)
Transition of Care [facility]) - Progression Note    Patient Details  Name: Matthew Hall MRN: [id number] Date of Birth: 1945-12-10  [facility] CM/SW Contact  Kermit Balo, RN Phone Number: 04/17/2023, 3:05 PM  Clinical Narrative:     Patient has insurance approval for [facility]. They have a bed for him in the am. Daughter and patient updated.  Daughter will transport tomorrow.  Expected Discharge Plan: IP Rehab Facility Barriers to Discharge: Continued Medical Work up  Expected Discharge Plan and Services   Discharge Planning Services: CM Consult Post Acute Care Choice: IP Rehab Living arrangements for the past 2 months: Single Family Home                                       Social Determinants of Health (SDOH) Interventions SDOH Screenings   Food Insecurity: No Food Insecurity (04/16/2023)  Housing: High Risk (04/16/2023)  Transportation Needs: Unmet Transportation Needs (04/16/2023)  Utilities: Not At Risk (04/16/2023)  Depression (PHQ2-9): Low Risk  (05/04/2019)  Tobacco Use: Medium Risk (04/12/2023)    Readmission Risk Interventions     No data to display

## 2023-04-17 NOTE — Plan of Care (Signed)

## 2023-04-17 NOTE — Discharge Summary (Signed)
ICA Distal121     30                                                   +----------+--------+--------+--------+------------------+------------------+ ECA       125     10                                                   +----------+--------+--------+--------+------------------+------------------+ +----------+--------+--------+--------+-------------------+           PSV cm/sEDV cm/sDescribeArm Pressure (mmHG) +----------+--------+--------+--------+-------------------+ [id number]     0                                    +----------+--------+--------+--------+-------------------+ +---------+--------+--+--------+--+ VertebralPSV cm/s75EDV cm/s15 +---------+--------+--+--------+--+   Summary: Right Carotid: Velocities in the right ICA are consistent with a 1-39% stenosis. Left Carotid: Velocities in the left ICA are consistent with a 1-39% stenosis. Vertebrals:  Bilateral vertebral arteries demonstrate antegrade flow. Subclavians: Normal flow hemodynamics were seen in bilateral subclavian              arteries. *See table(s) above for measurements and observations.  Electronically signed by Heath Lark on 04/13/2023 at 6:45:35 PM.    Final    ECHOCARDIOGRAM COMPLETE  Result Date: 04/13/2023    ECHOCARDIOGRAM REPORT   Patient Name:   Matthew Hall Date of Exam: 04/13/2023 Medical Rec #:  [id number]         Height:       71.0 in Accession #:    [id number]        Weight:       160.0 lb Date of Birth:  04-Feb-1946        BSA:          1.918 m Patient Age:    77 years          BP:           95/59 mmHg Patient Gender: M                 HR:           82 bpm. Exam Location:  Inpatient Procedure: 2D Echo, Cardiac Doppler and Color Doppler Indications:    stroke  History:        Patient has no prior history of Echocardiogram examinations.                 Stroke and COPD, Arrythmias:Atrial Fibrillation; Risk                 Factors:Hypertension.  Sonographer:    Melissa Morford RDCS (AE, PE) Referring Phys: Onnie Boer IMPRESSIONS  1. Left ventricular ejection fraction, by estimation, is 40 to 45%. The left ventricle has mildly decreased function. The left ventricle has no regional wall motion abnormalities. Left ventricular diastolic parameters are indeterminate.  2. Moderator band noted in the right ventricle. Right ventricular systolic function is normal. The right ventricular size is moderately enlarged.  3. Right atrial size was severely dilated.  4. The mitral valve is normal in structure. No evidence of mitral valve  regurgitation. No evidence of mitral stenosis.  5.  DP       110               0.99 biphasic                    +---------+------------------+-----+-------------------+--------+ Great Toe66                0.59 Normal                      +---------+------------------+-----+-------------------+--------+ +---------+------------------+-----+---------+-------+ Left     Lt Pressure (mmHg)IndexWaveform Comment +---------+------------------+-----+---------+-------+ Brachial 109                    triphasic        +---------+------------------+-----+---------+-------+ PTA      115               1.04 triphasic        +---------+------------------+-----+---------+-------+ DP       107               0.96 biphasic         +---------+------------------+-----+---------+-------+ Great Toe0                 0.00 Absent           +---------+------------------+-----+---------+-------+ +-------+-----------+-----------+------------+------------+ ABI/TBIToday's ABIToday's TBIPrevious ABIPrevious TBI +-------+-----------+-----------+------------+------------+ Right  0.99       0.59                                +-------+-----------+-----------+------------+------------+ Left   1.04       0                                   +-------+-----------+-----------+------------+------------+  Summary: Right: Resting right ankle-brachial index is within normal range. The right toe-brachial index is abnormal. Left: Resting left ankle-brachial index is within normal range. The left toe-brachial index is abnormal. *See table(s) above for measurements and observations.  Electronically signed by Heath Lark on 04/13/2023 at 6:49:39 PM.    Final    VAS US CAROTID  Result Date: 04/13/2023 Carotid Arterial Duplex Study Patient Name:  Matthew Hall  Date of Exam:   04/13/2023 Medical Rec #: [id number]          Accession #:    [id number] Date of  Birth: 01/24/1946         Patient Gender: M Patient Age:   77 years Exam Location:  [facility] Procedure:      VAS US CAROTID Referring Phys: Kennis Carina --------------------------------------------------------------------------------  Indications:   CVA. Risk Factors:  Hypertension, Diabetes, prior CVA. Other Factors: Vasculitis. Performing Technologist: Shona Simpson  Examination Guidelines: A complete evaluation includes B-mode imaging, spectral Doppler, color Doppler, and power Doppler as needed of all accessible portions of each vessel. Bilateral testing is considered an integral part of a complete examination. Limited examinations for reoccurring indications may be performed as noted.  Right Carotid Findings: +----------+--------+--------+--------+---------------------+------------------+           PSV cm/sEDV cm/sStenosisPlaque Description   Comments           +----------+--------+--------+--------+---------------------+------------------+ CCA Prox  116     14                                   intimal  DP       110               0.99 biphasic                    +---------+------------------+-----+-------------------+--------+ Great Toe66                0.59 Normal                      +---------+------------------+-----+-------------------+--------+ +---------+------------------+-----+---------+-------+ Left     Lt Pressure (mmHg)IndexWaveform Comment +---------+------------------+-----+---------+-------+ Brachial 109                    triphasic        +---------+------------------+-----+---------+-------+ PTA      115               1.04 triphasic        +---------+------------------+-----+---------+-------+ DP       107               0.96 biphasic         +---------+------------------+-----+---------+-------+ Great Toe0                 0.00 Absent           +---------+------------------+-----+---------+-------+ +-------+-----------+-----------+------------+------------+ ABI/TBIToday's ABIToday's TBIPrevious ABIPrevious TBI +-------+-----------+-----------+------------+------------+ Right  0.99       0.59                                +-------+-----------+-----------+------------+------------+ Left   1.04       0                                   +-------+-----------+-----------+------------+------------+  Summary: Right: Resting right ankle-brachial index is within normal range. The right toe-brachial index is abnormal. Left: Resting left ankle-brachial index is within normal range. The left toe-brachial index is abnormal. *See table(s) above for measurements and observations.  Electronically signed by Heath Lark on 04/13/2023 at 6:49:39 PM.    Final    VAS US CAROTID  Result Date: 04/13/2023 Carotid Arterial Duplex Study Patient Name:  Matthew Hall  Date of Exam:   04/13/2023 Medical Rec #: [id number]          Accession #:    [id number] Date of  Birth: 01/24/1946         Patient Gender: M Patient Age:   77 years Exam Location:  [facility] Procedure:      VAS US CAROTID Referring Phys: Kennis Carina --------------------------------------------------------------------------------  Indications:   CVA. Risk Factors:  Hypertension, Diabetes, prior CVA. Other Factors: Vasculitis. Performing Technologist: Shona Simpson  Examination Guidelines: A complete evaluation includes B-mode imaging, spectral Doppler, color Doppler, and power Doppler as needed of all accessible portions of each vessel. Bilateral testing is considered an integral part of a complete examination. Limited examinations for reoccurring indications may be performed as noted.  Right Carotid Findings: +----------+--------+--------+--------+---------------------+------------------+           PSV cm/sEDV cm/sStenosisPlaque Description   Comments           +----------+--------+--------+--------+---------------------+------------------+ CCA Prox  116     14                                   intimal  Physician Discharge Summary  Matthew Hall ZOX:[id number] DOB: 05-Mar-1946 DOA: 04/12/2023  PCP: Alinda Deem, MD  Admit date: 04/12/2023 Discharge date: 04/18/23  Time spent: 40 minutes  Recommendations for Outpatient Follow-up:  Follow outpatient CBC/CMP  Needs neurology follow up, outpatient EMG Needs AFO vs foot strap for foot drop Follow with neurology outpatient Follow with vascular for abnormal abi Follow with rheum for vasculitis Follow with cards in 6-8 weeks ([facility]) Thiazide and arb on hold, consider resumption outpatient pending BP trend Trialing gabapentin, consider uptitrating as tolerated for neuropathic pain Follow tolerance of metformin, uptitrate as tolerated  Discharge Diagnoses:  Principal Problem:   Acute CVA (cerebrovascular accident) [facility]) Active Problems:   Atrial fibrillation, chronic (HCC)   Lymphocytic vasculitis (HCC)   Essential hypertension   Paresthesia   Anxiety   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)   Discharge Condition: stable  Diet recommendation: heart healthy  Filed Weights   04/12/23 0913  Weight: 72.6 kg    History of present illness:   AZAD CALAME is a 77 y.o. male with medical history significant for atrial fibrillation, COPD, hypertension, vasculitis, bladder cancer, diabetic neuropathy.   He presented to ED with RLE weakness.  Noted to have R foot drop.  MRI showed punctate infarcts in the right temporal lobe, right basal ganglia, and left frontal white matter.  Seen by neurology who felt his stroke likely incidental, they felt R foot drop likely fron R peroneal nerve injury.  Transitioned from warfarin to eliquis in the setting of strokes on warfarin.  No statin given side effects.    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 Terrace Arabia in 4 weeks   Right Foot Drop Appreciate neurology assistance -suspects common peroneal nerve injury  AFO vs footdrop strap, appreciate therapy assistance Needs outpatient EMG   Neuropathic Pain Trial gabapentin    Lymphocytic vasculitis (HCC) Follows with Dr. Octaviano Glow at [place].  Recent and persistent rash to bilateral feet for which she was seen in the ED 8/26- there was concern for vasculitic etiology during his history, as against tickborne illness as etiology.  CRP 2.9, ESR was 23.  He was started on a course of steroids, and doxycycline.  He followed up with his rheumatologist, he is still taking the steroids.   -Continue prednisone 10 mg daily - sed rate 38, CRP 5.7 -follow outpatient with rheumatology   Atrial fibrillation, chronic (HCC) EKG currently showing atrial fibrillation.   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 Diona Browner in 6-8 weeks He's euvolemic at this time   Difficult to appreciate L DP  Physician Discharge Summary  Matthew Hall ZOX:[id number] DOB: 05-Mar-1946 DOA: 04/12/2023  PCP: Alinda Deem, MD  Admit date: 04/12/2023 Discharge date: 04/18/23  Time spent: 40 minutes  Recommendations for Outpatient Follow-up:  Follow outpatient CBC/CMP  Needs neurology follow up, outpatient EMG Needs AFO vs foot strap for foot drop Follow with neurology outpatient Follow with vascular for abnormal abi Follow with rheum for vasculitis Follow with cards in 6-8 weeks ([facility]) Thiazide and arb on hold, consider resumption outpatient pending BP trend Trialing gabapentin, consider uptitrating as tolerated for neuropathic pain Follow tolerance of metformin, uptitrate as tolerated  Discharge Diagnoses:  Principal Problem:   Acute CVA (cerebrovascular accident) [facility]) Active Problems:   Atrial fibrillation, chronic (HCC)   Lymphocytic vasculitis (HCC)   Essential hypertension   Paresthesia   Anxiety   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)   Discharge Condition: stable  Diet recommendation: heart healthy  Filed Weights   04/12/23 0913  Weight: 72.6 kg    History of present illness:   AZAD CALAME is a 77 y.o. male with medical history significant for atrial fibrillation, COPD, hypertension, vasculitis, bladder cancer, diabetic neuropathy.   He presented to ED with RLE weakness.  Noted to have R foot drop.  MRI showed punctate infarcts in the right temporal lobe, right basal ganglia, and left frontal white matter.  Seen by neurology who felt his stroke likely incidental, they felt R foot drop likely fron R peroneal nerve injury.  Transitioned from warfarin to eliquis in the setting of strokes on warfarin.  No statin given side effects.    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 Terrace Arabia in 4 weeks   Right Foot Drop Appreciate neurology assistance -suspects common peroneal nerve injury  AFO vs footdrop strap, appreciate therapy assistance Needs outpatient EMG   Neuropathic Pain Trial gabapentin    Lymphocytic vasculitis (HCC) Follows with Dr. Octaviano Glow at [place].  Recent and persistent rash to bilateral feet for which she was seen in the ED 8/26- there was concern for vasculitic etiology during his history, as against tickborne illness as etiology.  CRP 2.9, ESR was 23.  He was started on a course of steroids, and doxycycline.  He followed up with his rheumatologist, he is still taking the steroids.   -Continue prednisone 10 mg daily - sed rate 38, CRP 5.7 -follow outpatient with rheumatology   Atrial fibrillation, chronic (HCC) EKG currently showing atrial fibrillation.   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 Diona Browner in 6-8 weeks He's euvolemic at this time   Difficult to appreciate L DP  Physician Discharge Summary  Matthew Hall ZOX:[id number] DOB: 05-Mar-1946 DOA: 04/12/2023  PCP: Alinda Deem, MD  Admit date: 04/12/2023 Discharge date: 04/18/23  Time spent: 40 minutes  Recommendations for Outpatient Follow-up:  Follow outpatient CBC/CMP  Needs neurology follow up, outpatient EMG Needs AFO vs foot strap for foot drop Follow with neurology outpatient Follow with vascular for abnormal abi Follow with rheum for vasculitis Follow with cards in 6-8 weeks ([place]) Thiazide and arb on hold, consider resumption outpatient pending BP trend Trialing gabapentin, consider uptitrating as tolerated for neuropathic pain Follow tolerance of metformin, uptitrate as tolerated  Discharge Diagnoses:  Principal Problem:   Acute CVA (cerebrovascular accident) [facility]) Active Problems:   Atrial fibrillation, chronic (HCC)   Lymphocytic vasculitis (HCC)   Essential hypertension   Paresthesia   Anxiety   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)   Discharge Condition: stable  Diet recommendation: heart healthy  Filed Weights   04/12/23 0913  Weight: 72.6 kg    History of present illness:   AZAD CALAME is a 77 y.o. male with medical history significant for atrial fibrillation, COPD, hypertension, vasculitis, bladder cancer, diabetic neuropathy.   He presented to ED with RLE weakness.  Noted to have R foot drop.  MRI showed punctate infarcts in the right temporal lobe, right basal ganglia, and left frontal white matter.  Seen by neurology who felt his stroke likely incidental, they felt R foot drop likely fron R peroneal nerve injury.  Transitioned from warfarin to eliquis in the setting of strokes on warfarin.  No statin given side effects.    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 Terrace Arabia in 4 weeks   Right Foot Drop Appreciate neurology assistance -suspects common peroneal nerve injury  AFO vs footdrop strap, appreciate therapy assistance Needs outpatient EMG   Neuropathic Pain Trial gabapentin    Lymphocytic vasculitis (HCC) Follows with Dr. Octaviano Glow at [place].  Recent and persistent rash to bilateral feet for which she was seen in the ED 8/26- there was concern for vasculitic etiology during his history, as against tickborne illness as etiology.  CRP 2.9, ESR was 23.  He was started on a course of steroids, and doxycycline.  He followed up with his rheumatologist, he is still taking the steroids.   -Continue prednisone 10 mg daily - sed rate 38, CRP 5.7 -follow outpatient with rheumatology   Atrial fibrillation, chronic (HCC) EKG currently showing atrial fibrillation.   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 Diona Browner in 6-8 weeks He's euvolemic at this time   Difficult to appreciate L DP  Physician Discharge Summary  Matthew Hall ZOX:[id number] DOB: 05-Mar-1946 DOA: 04/12/2023  PCP: Alinda Deem, MD  Admit date: 04/12/2023 Discharge date: 04/18/23  Time spent: 40 minutes  Recommendations for Outpatient Follow-up:  Follow outpatient CBC/CMP  Needs neurology follow up, outpatient EMG Needs AFO vs foot strap for foot drop Follow with neurology outpatient Follow with vascular for abnormal abi Follow with rheum for vasculitis Follow with cards in 6-8 weeks ([facility]) Thiazide and arb on hold, consider resumption outpatient pending BP trend Trialing gabapentin, consider uptitrating as tolerated for neuropathic pain Follow tolerance of metformin, uptitrate as tolerated  Discharge Diagnoses:  Principal Problem:   Acute CVA (cerebrovascular accident) [facility]) Active Problems:   Atrial fibrillation, chronic (HCC)   Lymphocytic vasculitis (HCC)   Essential hypertension   Paresthesia   Anxiety   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)   Discharge Condition: stable  Diet recommendation: heart healthy  Filed Weights   04/12/23 0913  Weight: 72.6 kg    History of present illness:   AZAD CALAME is a 77 y.o. male with medical history significant for atrial fibrillation, COPD, hypertension, vasculitis, bladder cancer, diabetic neuropathy.   He presented to ED with RLE weakness.  Noted to have R foot drop.  MRI showed punctate infarcts in the right temporal lobe, right basal ganglia, and left frontal white matter.  Seen by neurology who felt his stroke likely incidental, they felt R foot drop likely fron R peroneal nerve injury.  Transitioned from warfarin to eliquis in the setting of strokes on warfarin.  No statin given side effects.    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 Terrace Arabia in 4 weeks   Right Foot Drop Appreciate neurology assistance -suspects common peroneal nerve injury  AFO vs footdrop strap, appreciate therapy assistance Needs outpatient EMG   Neuropathic Pain Trial gabapentin    Lymphocytic vasculitis (HCC) Follows with Dr. Octaviano Glow at [place].  Recent and persistent rash to bilateral feet for which she was seen in the ED 8/26- there was concern for vasculitic etiology during his history, as against tickborne illness as etiology.  CRP 2.9, ESR was 23.  He was started on a course of steroids, and doxycycline.  He followed up with his rheumatologist, he is still taking the steroids.   -Continue prednisone 10 mg daily - sed rate 38, CRP 5.7 -follow outpatient with rheumatology   Atrial fibrillation, chronic (HCC) EKG currently showing atrial fibrillation.   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 Diona Browner in 6-8 weeks He's euvolemic at this time   Difficult to appreciate L DP  ICA Distal121     30                                                   +----------+--------+--------+--------+------------------+------------------+ ECA       125     10                                                   +----------+--------+--------+--------+------------------+------------------+ +----------+--------+--------+--------+-------------------+           PSV cm/sEDV cm/sDescribeArm Pressure (mmHG) +----------+--------+--------+--------+-------------------+ [id number]     0                                    +----------+--------+--------+--------+-------------------+ +---------+--------+--+--------+--+ VertebralPSV cm/s75EDV cm/s15 +---------+--------+--+--------+--+   Summary: Right Carotid: Velocities in the right ICA are consistent with a 1-39% stenosis. Left Carotid: Velocities in the left ICA are consistent with a 1-39% stenosis. Vertebrals:  Bilateral vertebral arteries demonstrate antegrade flow. Subclavians: Normal flow hemodynamics were seen in bilateral subclavian              arteries. *See table(s) above for measurements and observations.  Electronically signed by Heath Lark on 04/13/2023 at 6:45:35 PM.    Final    ECHOCARDIOGRAM COMPLETE  Result Date: 04/13/2023    ECHOCARDIOGRAM REPORT   Patient Name:   Matthew Hall Date of Exam: 04/13/2023 Medical Rec #:  [id number]         Height:       71.0 in Accession #:    [id number]        Weight:       160.0 lb Date of Birth:  04-Feb-1946        BSA:          1.918 m Patient Age:    77 years          BP:           95/59 mmHg Patient Gender: M                 HR:           82 bpm. Exam Location:  Inpatient Procedure: 2D Echo, Cardiac Doppler and Color Doppler Indications:    stroke  History:        Patient has no prior history of Echocardiogram examinations.                 Stroke and COPD, Arrythmias:Atrial Fibrillation; Risk                 Factors:Hypertension.  Sonographer:    Melissa Morford RDCS (AE, PE) Referring Phys: Onnie Boer IMPRESSIONS  1. Left ventricular ejection fraction, by estimation, is 40 to 45%. The left ventricle has mildly decreased function. The left ventricle has no regional wall motion abnormalities. Left ventricular diastolic parameters are indeterminate.  2. Moderator band noted in the right ventricle. Right ventricular systolic function is normal. The right ventricular size is moderately enlarged.  3. Right atrial size was severely dilated.  4. The mitral valve is normal in structure. No evidence of mitral valve  regurgitation. No evidence of mitral stenosis.  5.  DP       110               0.99 biphasic                    +---------+------------------+-----+-------------------+--------+ Great Toe66                0.59 Normal                      +---------+------------------+-----+-------------------+--------+ +---------+------------------+-----+---------+-------+ Left     Lt Pressure (mmHg)IndexWaveform Comment +---------+------------------+-----+---------+-------+ Brachial 109                    triphasic        +---------+------------------+-----+---------+-------+ PTA      115               1.04 triphasic        +---------+------------------+-----+---------+-------+ DP       107               0.96 biphasic         +---------+------------------+-----+---------+-------+ Great Toe0                 0.00 Absent           +---------+------------------+-----+---------+-------+ +-------+-----------+-----------+------------+------------+ ABI/TBIToday's ABIToday's TBIPrevious ABIPrevious TBI +-------+-----------+-----------+------------+------------+ Right  0.99       0.59                                +-------+-----------+-----------+------------+------------+ Left   1.04       0                                   +-------+-----------+-----------+------------+------------+  Summary: Right: Resting right ankle-brachial index is within normal range. The right toe-brachial index is abnormal. Left: Resting left ankle-brachial index is within normal range. The left toe-brachial index is abnormal. *See table(s) above for measurements and observations.  Electronically signed by Heath Lark on 04/13/2023 at 6:49:39 PM.    Final    VAS US CAROTID  Result Date: 04/13/2023 Carotid Arterial Duplex Study Patient Name:  Matthew Hall  Date of Exam:   04/13/2023 Medical Rec #: [id number]          Accession #:    [id number] Date of  Birth: 01/24/1946         Patient Gender: M Patient Age:   77 years Exam Location:  [facility] Procedure:      VAS US CAROTID Referring Phys: Kennis Carina --------------------------------------------------------------------------------  Indications:   CVA. Risk Factors:  Hypertension, Diabetes, prior CVA. Other Factors: Vasculitis. Performing Technologist: Shona Simpson  Examination Guidelines: A complete evaluation includes B-mode imaging, spectral Doppler, color Doppler, and power Doppler as needed of all accessible portions of each vessel. Bilateral testing is considered an integral part of a complete examination. Limited examinations for reoccurring indications may be performed as noted.  Right Carotid Findings: +----------+--------+--------+--------+---------------------+------------------+           PSV cm/sEDV cm/sStenosisPlaque Description   Comments           +----------+--------+--------+--------+---------------------+------------------+ CCA Prox  116     14                                   intimal  DP       110               0.99 biphasic                    +---------+------------------+-----+-------------------+--------+ Great Toe66                0.59 Normal                      +---------+------------------+-----+-------------------+--------+ +---------+------------------+-----+---------+-------+ Left     Lt Pressure (mmHg)IndexWaveform Comment +---------+------------------+-----+---------+-------+ Brachial 109                    triphasic        +---------+------------------+-----+---------+-------+ PTA      115               1.04 triphasic        +---------+------------------+-----+---------+-------+ DP       107               0.96 biphasic         +---------+------------------+-----+---------+-------+ Great Toe0                 0.00 Absent           +---------+------------------+-----+---------+-------+ +-------+-----------+-----------+------------+------------+ ABI/TBIToday's ABIToday's TBIPrevious ABIPrevious TBI +-------+-----------+-----------+------------+------------+ Right  0.99       0.59                                +-------+-----------+-----------+------------+------------+ Left   1.04       0                                   +-------+-----------+-----------+------------+------------+  Summary: Right: Resting right ankle-brachial index is within normal range. The right toe-brachial index is abnormal. Left: Resting left ankle-brachial index is within normal range. The left toe-brachial index is abnormal. *See table(s) above for measurements and observations.  Electronically signed by Heath Lark on 04/13/2023 at 6:49:39 PM.    Final    VAS US CAROTID  Result Date: 04/13/2023 Carotid Arterial Duplex Study Patient Name:  Matthew Hall  Date of Exam:   04/13/2023 Medical Rec #: [id number]          Accession #:    [id number] Date of  Birth: 01/24/1946         Patient Gender: M Patient Age:   77 years Exam Location:  [facility] Procedure:      VAS US CAROTID Referring Phys: Kennis Carina --------------------------------------------------------------------------------  Indications:   CVA. Risk Factors:  Hypertension, Diabetes, prior CVA. Other Factors: Vasculitis. Performing Technologist: Shona Simpson  Examination Guidelines: A complete evaluation includes B-mode imaging, spectral Doppler, color Doppler, and power Doppler as needed of all accessible portions of each vessel. Bilateral testing is considered an integral part of a complete examination. Limited examinations for reoccurring indications may be performed as noted.  Right Carotid Findings: +----------+--------+--------+--------+---------------------+------------------+           PSV cm/sEDV cm/sStenosisPlaque Description   Comments           +----------+--------+--------+--------+---------------------+------------------+ CCA Prox  116     14                                   intimal  Physician Discharge Summary  Matthew Hall ZOX:[id number] DOB: 05-Mar-1946 DOA: 04/12/2023  PCP: Alinda Deem, MD  Admit date: 04/12/2023 Discharge date: 04/18/23  Time spent: 40 minutes  Recommendations for Outpatient Follow-up:  Follow outpatient CBC/CMP  Needs neurology follow up, outpatient EMG Needs AFO vs foot strap for foot drop Follow with neurology outpatient Follow with vascular for abnormal abi Follow with rheum for vasculitis Follow with cards in 6-8 weeks ([place]) Thiazide and arb on hold, consider resumption outpatient pending BP trend Trialing gabapentin, consider uptitrating as tolerated for neuropathic pain Follow tolerance of metformin, uptitrate as tolerated  Discharge Diagnoses:  Principal Problem:   Acute CVA (cerebrovascular accident) [facility]) Active Problems:   Atrial fibrillation, chronic (HCC)   Lymphocytic vasculitis (HCC)   Essential hypertension   Paresthesia   Anxiety   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)   Discharge Condition: stable  Diet recommendation: heart healthy  Filed Weights   04/12/23 0913  Weight: 72.6 kg    History of present illness:   AZAD CALAME is a 77 y.o. male with medical history significant for atrial fibrillation, COPD, hypertension, vasculitis, bladder cancer, diabetic neuropathy.   He presented to ED with RLE weakness.  Noted to have R foot drop.  MRI showed punctate infarcts in the right temporal lobe, right basal ganglia, and left frontal white matter.  Seen by neurology who felt his stroke likely incidental, they felt R foot drop likely fron R peroneal nerve injury.  Transitioned from warfarin to eliquis in the setting of strokes on warfarin.  No statin given side effects.    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 Terrace Arabia in 4 weeks   Right Foot Drop Appreciate neurology assistance -suspects common peroneal nerve injury  AFO vs footdrop strap, appreciate therapy assistance Needs outpatient EMG   Neuropathic Pain Trial gabapentin    Lymphocytic vasculitis (HCC) Follows with Dr. Octaviano Glow at [place].  Recent and persistent rash to bilateral feet for which she was seen in the ED 8/26- there was concern for vasculitic etiology during his history, as against tickborne illness as etiology.  CRP 2.9, ESR was 23.  He was started on a course of steroids, and doxycycline.  He followed up with his rheumatologist, he is still taking the steroids.   -Continue prednisone 10 mg daily - sed rate 38, CRP 5.7 -follow outpatient with rheumatology   Atrial fibrillation, chronic (HCC) EKG currently showing atrial fibrillation.   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 Diona Browner in 6-8 weeks He's euvolemic at this time   Difficult to appreciate L DP

## 2023-04-17 NOTE — Progress Notes (Signed)
Occupational Therapy Treatment Patient Details Name: Matthew Hall MRN: [id number] DOB: 11-28-1945 Today's Date: 04/17/2023   History of present illness Pt is a 77 y/o male who presents 04/12/2023 to [facility] for RLE weakness, numbness, pain. Pt transferred to [facility] and MRI revealed acute punctate infarcts in the R temporal lobe, R basal ganglia, and L frontal white matter. PMH significant for a-fib, COPD, HTN, vasculitis, DM, diabetic neuropathy, bladder CA.   OT comments  Pt progressing towards goals, able to don shoes and R toe lifter with CGA, supervision for bed mobility and CGA-min A for transfers with RW. Pt educated strategies to decr fall risk at home and pt verbalized understanding. Pt presenting with impairments listed below, will follow acutely. Patient will benefit from intensive inpatient follow up therapy, >3 hours/day to maximize safety/ind with ADLs/functional mobility.       If plan is discharge home, recommend the following:  A little help with bathing/dressing/bathroom;Assistance with cooking/housework;Assist for transportation;A little help with walking and/or transfers   Equipment Recommendations  None recommended by OT    Recommendations for Other Services Rehab consult    Precautions / Restrictions Precautions Precautions: Fall Precaution Comments: Hypersensitivity in lower legs and feet R>L Restrictions Weight Bearing Restrictions: No       Mobility Bed Mobility Overal bed mobility: Needs Assistance Bed Mobility: Supine to Sit, Sit to Supine     Supine to sit: Supervision          Transfers Overall transfer level: Needs assistance Equipment used: Rolling walker (2 wheels) Transfers: Sit to/from Stand Sit to Stand: Contact guard assist, Min assist                 Balance Overall balance assessment: Needs assistance Sitting-balance support: Feet supported, Single extremity supported Sitting balance-Leahy Scale: Good     Standing balance  support: No upper extremity supported Standing balance-Leahy Scale: Fair                             ADL either performed or assessed with clinical judgement   ADL Overall ADL's : Needs assistance/impaired                     Lower Body Dressing: Contact guard assist Lower Body Dressing Details (indicate cue type and reason): donning shoes and R foot toe lifter Toilet Transfer: Minimal assistance;Ambulation;Rolling walker (2 wheels);Regular Toilet           Functional mobility during ADLs: Minimal assistance;Rolling walker (2 wheels)      Extremity/Trunk Assessment Upper Extremity Assessment Upper Extremity Assessment: Overall WFL for tasks assessed   Lower Extremity Assessment Lower Extremity Assessment: Defer to PT evaluation RLE Deficits / Details: R foot drop        Vision   Vision Assessment?: No apparent visual deficits   Perception Perception Perception: Not tested   Praxis Praxis Praxis: Not tested    Cognition Arousal: Alert Behavior During Therapy: WFL for tasks assessed/performed Overall Cognitive Status: No family/caregiver present to determine baseline cognitive functioning                                 General Comments: tangential at times; overall WFL for ADL tasks; will further assess        Exercises      Shoulder Instructions       General Comments VSS on RA  Pertinent Vitals/ Pain       Pain Assessment Pain Assessment: No/denies pain Pain Location: lower legs and feet R>L Pain Descriptors / Indicators: Burning Pain Intervention(s): Limited activity within patient's tolerance, Monitored during session, Repositioned  Home Living                                          Prior Functioning/Environment              Frequency  Min 1X/week        Progress Toward Goals  OT Goals(current goals can now be found in the care plan section)  Progress towards OT goals:  Progressing toward goals  Acute Rehab OT Goals Patient Stated Goal: to order lunch OT Goal Formulation: With patient Time For Goal Achievement: 04/27/23 Potential to Achieve Goals: Good ADL Goals Pt Will Perform Lower Body Bathing: with modified independence;sit to/from stand Pt Will Perform Lower Body Dressing: with modified independence;sit to/from stand Pt Will Transfer to Toilet: with modified independence;ambulating Pt Will Perform Toileting - Clothing Manipulation and hygiene: with modified independence;sit to/from stand Additional ADL Goal #1: Pt will independently verbalize 3 strategies to reduce risk of falls  Plan      Co-evaluation                 AM-PAC OT "6 Clicks" Daily Activity     Outcome Measure   Help from another person eating meals?: None Help from another person taking care of personal grooming?: A Little Help from another person toileting, which includes using toliet, bedpan, or urinal?: A Little Help from another person bathing (including washing, rinsing, drying)?: A Little Help from another person to put on and taking off regular upper body clothing?: A Little Help from another person to put on and taking off regular lower body clothing?: A Little 6 Click Score: 19    End of Session Equipment Utilized During Treatment: Rolling walker (2 wheels);Gait belt  OT Visit Diagnosis: Unsteadiness on feet (R26.81);Muscle weakness (generalized) (M62.81);Pain   Activity Tolerance Patient tolerated treatment well   Patient Left in chair;with call bell/phone within reach;with chair alarm set   Nurse Communication Mobility status        Time: 3086-5784 OT Time Calculation (min): 20 min  Charges: OT General Charges $OT Visit: 1 Visit OT Treatments $Self Care/Home Management : 8-22 mins  Carver Fila, OTD, OTR/L SecureChat Preferred Acute Rehab [phone]   Carver Fila Koonce 04/17/2023, 12:27 PM

## 2023-04-17 NOTE — Telephone Encounter (Signed)
Dr. Terrace Arabia had asked to scheduled nerve conduction and office visit same day. Billing stated that cannot be done. I will route to Dr. Terrace Arabia to ask which one needs to be done 1st.

## 2023-04-17 NOTE — Plan of Care (Signed)
  Problem: Education: Goal: Knowledge of General Education information will improve Description: Including pain rating scale, medication(s)/side effects and non-pharmacologic comfort measures Outcome: Progressing   Problem: Activity: Goal: Risk for activity intolerance will decrease Outcome: Progressing   Problem: Nutrition: Goal: Adequate nutrition will be maintained Outcome: Progressing   Problem: Coping: Goal: Level of anxiety will decrease Outcome: Progressing   Problem: Pain Managment: Goal: General experience of comfort will improve Outcome: Progressing   Problem: Safety: Goal: Ability to remain free from injury will improve Outcome: Progressing   Problem: Education: Goal: Knowledge of disease or condition will improve Outcome: Progressing   Problem: Self-Care: Goal: Ability to participate in self-care as condition permits will improve Outcome: Progressing

## 2023-04-17 NOTE — Progress Notes (Signed)
Heart Failure Navigator Progress Note  Assessed for Heart & Vascular TOC clinic readiness.  Patient admitted for CVA. ECHO 10/4 showed LVEF 40-45%. Cardiology consulted and reviewed ECHO - appears to be normal with EF 55-60%.    Will not schedule HF TOC at this time.  Sharen Hones, PharmD, BCPS Heart Failure Stewardship Pharmacist Phone [phone]

## 2023-04-18 DIAGNOSIS — I639 Cerebral infarction, unspecified: Secondary | ICD-10-CM | POA: Diagnosis not present

## 2023-04-18 NOTE — TOC Transition Note (Addendum)
Transition of Care [facility]) - CM/SW Discharge Note   Patient Details  Name: Matthew Hall MRN: [id number] Date of Birth: [birth year]  [facility] CM/SW Contact:  Kermit Balo, RN Phone Number: 04/18/2023, 10:40 AM   Clinical Narrative:     Patient is discharging to [facility] today. His daughter will provide needed transportation.  Room: C411 Number for report: [phone]  SDOH Interventions Today    Flowsheet Row Most Recent Value  SDOH Interventions   Housing Interventions Inpatient [facility]  Transportation Interventions Inpatient TOC        Final next level of care: IP Rehab Facility Barriers to Discharge: No Barriers Identified   Patient Goals and CMS Choice CMS Medicare.gov Compare Post Acute Care list provided to:: Patient Represenative (must comment) Choice offered to / list presented to : Adult Children  Discharge Placement                         Discharge Plan and Services Additional resources added to the After Visit Summary for     Discharge Planning Services: CM Consult Post Acute Care Choice: IP Rehab                               Social Determinants of Health (SDOH) Interventions SDOH Screenings   Food Insecurity: No Food Insecurity (04/16/2023)  Housing: High Risk (04/16/2023)  Transportation Needs: Unmet Transportation Needs (04/16/2023)  Utilities: Not At Risk (04/16/2023)  Depression (PHQ2-9): Low Risk  (05/04/2019)  Tobacco Use: Medium Risk (04/12/2023)     Readmission Risk Interventions     No data to display

## 2023-04-18 NOTE — Progress Notes (Signed)
Patient discharged to [facility], daughter is taking him, dropped to Main Entrance A to her car.

## 2023-04-18 NOTE — Progress Notes (Signed)
PROGRESS NOTE    Matthew Hall  ZOX:[id number] DOB: 1946/07/06 DOA: 04/12/2023 PCP: Alinda Deem, MD   Brief Narrative:  This 77 y.o. male with medical history significant for atrial fibrillation, COPD, hypertension, vasculitis, bladder cancer, diabetic neuropathy. He presented to ED with RLE weakness.  Noted to have R foot drop.  MRI showed punctate infarcts in the right temporal lobe, right basal ganglia, and left frontal white matter.  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  He was transitioned from warfarin to eliquis in the setting of strokes on warfarin.  No statin given side effects.  Neurology signed off.  Patient is being discharged to [facility] inpatient rehab for rehabilitation.  He was managed as below.   Assessment & Plan:   Principal Problem:   Acute CVA (cerebrovascular accident) [facility]) Active Problems:   Atrial fibrillation, chronic (HCC)   Lymphocytic vasculitis (HCC)   Essential hypertension   Paresthesia   Anxiety   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)   Acute CVA (cerebrovascular accident) [facility]): Appreciate Neurology assistance - this is likely incidental - > he's been transitioned from coumadin to eliquis. 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 Echo with LVEF 40-45% PT/OT/SLP -> inpatient rehab  LDL 79, HbA1c 6.9  No statin at this time given side effects Advised Neurology follow up with Dr. Terrace Arabia in 4 weeks.   Right Foot Drop: Appreciate neurology assistance -suspects common peroneal nerve injury  AFO vs  footdrop strap, appreciate therapy assistance. Needs outpatient EMG   Neuropathic Pain: Continue  gabapentin    Lymphocytic vasculitis (HCC): Follows with Dr. Octaviano Glow at [facility].  Recent and persistent rash to bilateral feet for which he was seen in the ED 8/26- there was concern for vasculitic etiology during his history, as against tickborne illness as etiology.  CRP 2.9, ESR was 23.  He was started on a course of steroids, and doxycycline.  He followed up with his rheumatologist, he is still taking the steroids.   -Continue prednisone 10 mg daily. - sed rate 38, CRP 5.7 -Follow outpatient with rheumatology   Atrial fibrillation, chronic (HCC) EKG currently showing atrial fibrillation.   INR 3.4 at presentation -Resume home 25 mg metoprolol for rate control. -Coumadin is transitioned with Eliquis. -Daily PT/INR (3.1 today)   HFrEF: EF 40-45%, will discuss with cardiology -> per review by cardiology, EF is 55-60% Follow with Dr. Diona Browner in 6-8 weeks He's euvolemic at this time.   Difficult to appreciate L DP Pulse - ABI's wnl, but abnormal TBI bilaterally Advised outpatient vascular follow up .   T2DM: - new diagnosis? A1c 6.9.  - will start metformin while he's here   Essential hypertension: Continue metoprolol Continue to hold arb and thiazide, bp's on soft side today --> consider resumption outpatient if indicated   Anxiety -Continue Xanax 0.25 mg twice daily   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) Stable.  Quit smoking cigarettes about 10 - 12 years ago. -Resume home regimen   DVT prophylaxis: Eliquis Code Status: Full code Family Communication:Daughter at bed side. Disposition Plan:   Patient being discharged to inpatient  rehab in [facility].   Consultants:  Neurology  Procedures: MRI Brain  Antimicrobials: None   Subjective: Patient was seen and examined at bedside.  Overnight events noted.   Patient reports doing much better and wants to be discharged  to inpatient rehab in [facility].  Objective: Vitals:   04/17/23 2010 04/17/23 2320 04/18/23 0322 04/18/23 0731  BP: 120/70 (!) 113/50 (!) 105/54 131/63  Pulse: 76 86 68 62  Resp: 18 18 18 17   Temp: 98.4 F (36.9 C) 97.6 F (36.4 C) 97.6 F (36.4 C) (!) 97.5 F (36.4 C)  TempSrc: Oral Oral Oral Oral  SpO2: 94% 92% 96% 98%  Weight:      Height:        Intake/Output Summary (Last 24 hours) at 04/18/2023 1250 Last data filed at 04/17/2023 1700 Gross per 24 hour  Intake 360 ml  Output 350 ml  Net 10 ml   Filed Weights   04/12/23 0913  Weight: 72.6 kg    Examination:  General exam: Appears calm and comfortable , deconditioned, not in any distress Respiratory system: Clear to auscultation. Respiratory effort normal.  RR 16 Cardiovascular system: S1 & S2 heard, RRR. No JVD, murmurs, rubs, gallops or clicks. No pedal edema. Gastrointestinal system: Abdomen is nondistended, soft and nontender.  Normal bowel sounds heard. Central nervous system: Alert and oriented. No focal neurological deficits. Extremities: Symmetric 5 x 5 power. Skin: No rashes, lesions or ulcers Psychiatry: Judgement and insight appear normal. Mood & affect appropriate.     Data Reviewed: I have personally reviewed following labs and imaging studies  CBC: Recent Labs  Lab 04/12/23 0939 04/12/23 0948 04/14/23 0424 04/16/23 0754  WBC 13.1*  --  15.5* 15.3*  NEUTROABS 10.4*  --   --   --   HGB 14.0 15.6 14.1 13.2  HCT 44.3 46.0 43.4 41.9  MCV 93.3  --  92.5 93.9  PLT 414*  --  391 383   Basic Metabolic Panel: Recent Labs  Lab 04/12/23 0939 04/12/23 0948 04/14/23 0424 04/16/23 0754  NA 135 137 135 140  K 3.5 3.7 3.6 3.6  CL 96* 98 94* 101  CO2 28  --  28 31  GLUCOSE 111* 112* 153* 93  BUN 27* 30* 30* 26*  CREATININE 1.01 1.00 1.09 0.94  CALCIUM 8.9  --  8.9 8.8*  MG  --   --   --  1.8  PHOS  --   --   --  2.8   GFR: Estimated Creatinine Clearance: 66.5 mL/min (by C-G formula based on  SCr of 0.94 mg/dL). Liver Function Tests: Recent Labs  Lab 04/12/23 0939  AST 15  ALT 21  ALKPHOS 48  BILITOT 0.6  PROT 7.0  ALBUMIN 3.0*   No results for input(s): "LIPASE", "AMYLASE" in the last 168 hours. No results for input(s): "AMMONIA" in the last 168 hours. Coagulation Profile: Recent Labs  Lab 04/12/23 0939 04/13/23 0713 04/14/23 0424 04/15/23 1140  INR 3.4* 3.1* 2.8* 1.9*   Cardiac Enzymes: No results for input(s): "CKTOTAL", "CKMB", "CKMBINDEX", "TROPONINI" in the last 168 hours. BNP (last 3 results) No results for input(s): "PROBNP" in the last 8760 hours. HbA1C: No results for input(s): "HGBA1C" in the last 72 hours. CBG: Recent Labs  Lab 04/14/23 0614 04/14/23 1131 04/14/23 1650 04/14/23 2129 04/15/23 0713  GLUCAP 124* 270* 123* 159* 171*   Lipid Profile: No results for input(s): "CHOL", "HDL", "LDLCALC", "TRIG", "CHOLHDL", "LDLDIRECT" in the last 72  hours. Thyroid Function Tests: No results for input(s): "TSH", "T4TOTAL", "FREET4", "T3FREE", "THYROIDAB" in the last 72 hours. Anemia Panel: No results for input(s): "VITAMINB12", "FOLATE", "FERRITIN", "TIBC", "IRON", "RETICCTPCT" in the last 72 hours. Sepsis Labs: No results for input(s): "PROCALCITON", "LATICACIDVEN" in the last 168 hours.  Recent Results (from the past 240 hour(s))  Culture, blood (Routine X 2) w Reflex to ID Panel     Status: None   Collection Time: 04/12/23  7:37 PM   Specimen: Right Antecubital; Blood  Result Value Ref Range Status   Specimen Description RIGHT ANTECUBITAL  Final   Special Requests   Final    BOTTLES DRAWN AEROBIC AND ANAEROBIC Blood Culture results may not be optimal due to an excessive volume of blood received in culture bottles   Culture   Final    NO GROWTH 5 DAYS Performed at [facility], [address] [place], [place] [address]    Report Status 04/17/2023 FINAL  Final  Culture, blood (Routine X 2) w Reflex to ID Panel     Status: None   Collection  Time: 04/12/23  7:48 PM   Specimen: BLOOD RIGHT FOREARM  Result Value Ref Range Status   Specimen Description BLOOD RIGHT FOREARM  Final   Special Requests   Final    BOTTLES DRAWN AEROBIC AND ANAEROBIC Blood Culture adequate volume   Culture   Final    NO GROWTH 5 DAYS Performed at [facility], [address] [place], [place] [address]    Report Status 04/17/2023 FINAL  Final    Radiology Studies: No results found.  Scheduled Meds:  ALPRAZolam  0.25 mg Oral BID   apixaban  5 mg Oral BID   gabapentin  100 mg Oral QHS   metFORMIN  500 mg Oral Q breakfast   metoprolol succinate  25 mg Oral BID   pantoprazole  40 mg Oral Daily   predniSONE  10 mg Oral BID WC   Continuous Infusions:   LOS: 6 days    Time spent: 35 mins    Willeen Niece, MD [facility]   If 7PM-7AM, please contact night-coverage

## 2023-04-19 NOTE — Telephone Encounter (Signed)
Pt was called, a brief vm was left asking pt to call and schedule an office visit, office # left on vm

## 2023-04-19 NOTE — Telephone Encounter (Signed)
Patient has been booked for appointment. Previous message regarding MRI can be discarded.

## 2023-04-30 ENCOUNTER — Telehealth: Payer: Self-pay | Admitting: Cardiology

## 2023-04-30 NOTE — Telephone Encounter (Signed)
  Pt said, he just got discharge from [facility] and he was told he needs a scan. He ask if Dr. Diona Browner can order the scan for him

## 2023-04-30 NOTE — Telephone Encounter (Signed)
Patient was at [facility] and he states they recommend him having a echocardiogram due to him having a stroke all records are in there system . Patient has an appointment with Herma Carson on 11/4

## 2023-05-01 NOTE — Telephone Encounter (Signed)
Patient informed and verbalized understanding of plan. 

## 2023-05-02 ENCOUNTER — Other Ambulatory Visit: Payer: Self-pay | Admitting: Nurse Practitioner

## 2023-05-09 ENCOUNTER — Encounter: Payer: Medicare Other | Admitting: Surgery

## 2023-05-09 ENCOUNTER — Ambulatory Visit: Payer: Self-pay | Admitting: Neurology

## 2023-05-09 ENCOUNTER — Ambulatory Visit (INDEPENDENT_AMBULATORY_CARE_PROVIDER_SITE_OTHER): Payer: Medicare Other | Admitting: Neurology

## 2023-05-09 DIAGNOSIS — R29898 Other symptoms and signs involving the musculoskeletal system: Secondary | ICD-10-CM | POA: Insufficient documentation

## 2023-05-09 DIAGNOSIS — I776 Arteritis, unspecified: Secondary | ICD-10-CM | POA: Diagnosis not present

## 2023-05-09 DIAGNOSIS — I639 Cerebral infarction, unspecified: Secondary | ICD-10-CM | POA: Diagnosis not present

## 2023-05-09 DIAGNOSIS — Z0289 Encounter for other administrative examinations: Secondary | ICD-10-CM

## 2023-05-09 MED ORDER — DULOXETINE HCL 60 MG PO CPEP: 60.0000 mg | ORAL_CAPSULE | Freq: Every day | ORAL | 11 refills | Status: DC

## 2023-05-09 MED ORDER — GABAPENTIN 100 MG PO CAPS: 300.0000 mg | ORAL_CAPSULE | Freq: Every day | ORAL | 11 refills | Status: DC

## 2023-05-09 NOTE — Procedures (Signed)
Full Name: Matthew Hall Gender: Male MRN #: [id number] Date of Birth: 17-Oct-1945    Visit Date: 05/09/2023 09:42 Age: 77 Years Examining Physician: Dr. Levert Feinstein Referring Physician: Dr. Levert Feinstein Height: 6 feet 0 inch History: 77 year old male with history of vasculitis, right lumbar radiculopathy, presenting with subacute worsening right lower extremity numbness, weakness, profound right distal leg weakness, gait abnormality  Summary of the test: Nerve conduction study: Bilateral sural, superficial peroneal sensory responses were absent.  Left median and ulnar sensory responses showed moderately prolonged peak latency, within normal range snap amplitude. Left radial sensory responses showed moderately decreased snap amplitude  Bilateral peroneal to EDB, right tibial motor responses were absent.  Left tibial motor response showed significantly decreased CMAP amplitude.  Left ulnar, and median motor responses were normal  Electromyography: Selected needle examinations were performed at bilateral lower extremity muscles, bilateral lumbosacral paraspinal muscles.  There is evidence of active neuropathic changes, most noticeable at right tibialis anterior, peroneal longus, also involving right tibialis posterior, medial gastrocnemius;  Mild chronic neuropathic changes involving right biceps femoris short head and long head.  There was mild chronic neuropathic changes involving left lower extremity muscles.  There was no spontaneous activity involving lumbosacral paraspinal muscles.   Conclusion: This is an abnormal study.  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  Differentiation diagnosis include right sciatic neuropathy due to vasculitis, could not rule out the possibility of chronic bilateral lumbar radiculopathy.  ------------------------------- Levert Feinstein, M.D.  PhD  [facility] [address] [place], [place] [address] Tel: [phone] Fax: [phone]  Verbal informed consent was obtained from the patient, patient was informed of potential risk of procedure, including bruising, bleeding, hematoma formation, infection, muscle weakness, muscle pain, numbness, among others.        MNC    Nerve / Sites Muscle Latency Ref. Amplitude Ref. Rel Amp Segments Distance Velocity Ref. Area    ms ms mV mV %  cm m/s m/s mVms  L Median - APB     Wrist APB 3.2 <=4.4 6.9 >=4.0 100 Wrist - APB 7   32.1     Upper arm APB 9.2  6.7  96.3 Upper arm - Wrist 30 50 >=49 29.7  L Ulnar - ADM     Wrist ADM 3.1 <=3.3 7.5 >=6.0 100 Wrist - ADM 7   26.3     B.Elbow ADM 5.6  5.8  76.9 B.Elbow - Wrist 12 48 >=49 23.3     A.Elbow ADM 9.5  5.1  87.7 A.Elbow - B.Elbow 18 46 >=49 22.8  R Peroneal - EDB     Ankle EDB NR <=6.5 NR >=2.0 NR Ankle - EDB 9   NR         Pop fossa - Ankle      L Peroneal - EDB     Ankle EDB NR <=6.5 NR >=2.0 NR Ankle - EDB 9   NR         Pop fossa - Ankle      R Tibial - AH     Ankle AH NR <=5.8 NR >=4.0 NR Ankle - AH 9   NR  L Tibial - AH     Ankle AH 5.7 <=5.8 1.0 >=4.0 100 Ankle - AH 9   4.1     Pop fossa AH 18.0  0.8  83.6 Pop fossa - Ankle 37 30 >=41  3.5                 SNC    Nerve / Sites Rec. Site Peak Lat Ref.  Amp Ref. Segments Distance    ms ms V V  cm  L Radial - Anatomical snuff box (Forearm)     Forearm Wrist 2.7 <=2.9 9 >=15 Forearm - Wrist 10  R Sural - Ankle (Calf)     Calf Ankle NR <=4.4 NR >=6 Calf - Ankle 14  L Sural - Ankle (Calf)     Calf Ankle NR <=4.4 NR >=6 Calf - Ankle 14  R Superficial peroneal - Ankle     Lat leg Ankle NR <=4.4 NR >=6 Lat leg - Ankle 14  L Superficial peroneal - Ankle     Lat leg Ankle NR <=4.4 NR >=6 Lat leg - Ankle 14  L Median - Orthodromic (Dig II, Mid palm)     Dig II Wrist 3.9 <=3.4 10 >=10 Dig II - Wrist 13  L Ulnar - Orthodromic, (Dig V, Mid palm)      Dig V Wrist 3.9 <=3.1 5 >=5 Dig V - Wrist 34                   F  Wave    Nerve F Lat Ref.   ms ms  L Ulnar - ADM 32.8 <=32.0  L Tibial - AH 67.6 <=56.0         EMG Summary Table    Spontaneous MUAP Recruitment  Muscle IA Fib PSW Fasc Other Amp Dur. Poly Pattern  R. Tibialis anterior Increased 2+ 2+ Occasional _______ Decreased Decreased 2+ Discrete  R. Tibialis posterior Increased 1+ 1+ None _______ Increased Increased 1+ Reduced  R. Peroneus longus Increased 2+ None None _______ Increased Increased 1+ Reduced  R. Gastrocnemius (Medial head) Increased 1+ None None _______ Increased Increased 1+ Reduced  R. Vastus lateralis Normal None None None _______ Increased Increased 1+ Reduced  R. Biceps femoris (short head) Increased None None None _______ Increased Increased 1+ Reduced  R. Biceps femoris (long head) Increased None None None _______ Increased Increased 1+ Reduced  R. Lumbar paraspinals (low) Normal None None None _______ Normal Normal Normal Normal  R. Lumbar paraspinals (mid) Normal None None None _______ Normal Normal Normal Normal  L. Tibialis anterior Increased 1+ None None _______ Increased Increased 1+ Reduced  L. Tibialis posterior Normal None None None _______ Increased Increased 1+ Reduced  L. Peroneus longus Normal None None None _______ Normal Normal Normal Reduced  L. Gastrocnemius (Medial head) Normal None None None _______ Normal Normal Normal Reduced  L. Vastus lateralis Normal None None None _______ Normal Normal Normal Reduced  L. Lumbar paraspinals (low) Normal None None None _______ Normal Normal Normal Normal  L. Lumbar paraspinals (mid) Normal None None None _______ Normal Normal Normal Normal  L. Biceps femoris (short head) Normal None None None _______ Normal Normal Normal Normal  L. Biceps femoris (long head) Normal None None None _______ Normal Normal Normal Normal  R. Gluteus medius Normal None None None _______ Normal Normal Normal Normal

## 2023-05-09 NOTE — Progress Notes (Signed)
ASSESSMENT AND PLAN  Matthew Hall is a 77 y.o. 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   MEDICAL HISTORY:  Matthew Hall, is a 77 year old male seen in request by Matthew Hall for electrodiagnostic study to evaluate his subacute onset right lower extremity weakness, gait abnormality, his primary care is from   Matthew Isaacs, MD   History is obtained from the patient and review of electronic medical records. I personally reviewed pertinent available imaging films in PACS.   PMHx of  Anxiety, chronic insomnia Vasculitis, under the care of rheumatologist, on prednisone COPD, Diabetes Stroke  Around 2014, he developed rash from head to toe, skin biopsy confirmed vasculitis, was treated with doxycycline, and prednisone, rash gradually disappeared in 1 month  He also had baseline chronic low back pain, radiating pain to right lower extremity, but not actively  He is the main caregiver of his wife, who suffered multiple medical disease, at baseline he is very active, trimming his trees, cutting bushes, around 2022, he developed numbness tingling at feet, starting at the right side, later began to involving left foot, sensitive, occasionally shooting pain,  He  was seen by Matthew Hall in May 2022, EMG nerve conduction study in July 2022 confirmed moderate axonal sensorimotor polyneuropathy, but lost follow-up since  In August 2024, he began to have flareups of rash again, this time contained to bilateral foot, he was under the care of local rheumatologist Matthew Hall at the end of the spectrum, was treated with low-dose of doxycycline and prednisone, rash began to improve  Hospital admission in October 2024, presented to hospital for 2 days of worsening right foot numbness, also began to noticed profound right foot weakness, develop subacute onset gait abnormality  Hospital evaluation showed positive DWI lesions on MRI of the brain, consistent with small vessel stroke, there is punctuated lesion involving right temporal, basal ganglion, left frontal white matter, in the background of 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, 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 Eyes: Conjunctivae clear without exudates or  hemorrhage Neck: Supple, no carotid bruits. Pulmonary: Clear to auscultation bilaterally   NEUROLOGICAL EXAM:  MENTAL STATUS: Speech/cognition: Awake, alert, oriented to history taking and casual conversation CRANIAL NERVES: CN II: Visual fields are full to confrontation. Pupils are round equal and briskly reactive to light. CN  III, IV, VI: extraocular movement are normal. No ptosis. CN V: Facial sensation is intact to light touch CN VII: Face is symmetric with normal eye closure  CN VIII: Hearing is normal to causal conversation. CN IX, X: Phonation is normal. CN XI: Head turning and shoulder shrug are intact  MOTOR: Erythematous rash involving both feet, most noticeable at the top of left foot, bilateral upper extremity and proximal lower extremity muscle strength was normal.   Moderate left toe flexion, extension weakness, no significant ankle weakness. Profound right ankle dorsiflexion, plantarflexion weakness, mild right ankle inversion, eversion weakness.  REFLEXES: Reflexes are 2+ and symmetric at the biceps, triceps, knees, and absent at ankles. Plantar responses are flexor.  SENSORY: Length-dependent decreased light touch, pinprick, vibratory sensation, most noticeable at right lower extremity COORDINATION: There is no trunk or limb dysmetria noted.  GAIT/STANCE: Need push-up to get up from seated position, flared right foot  REVIEW OF SYSTEMS:  Full 14 system review of systems performed and notable only for as above All other review of systems were negative.   ALLERGIES: Allergies  Allergen Reactions   Simvastatin Rash   Albuterol Palpitations   Augmentin [Amoxicillin-Pot Clavulanate] Rash   Ciprofloxacin Rash   Contrast Media [Iodinated Contrast Media] Rash   Flagyl [Metronidazole] Rash   Latex Hives and Rash   Sulfa Antibiotics Rash    HOME MEDICATIONS: Current Outpatient Medications  Medication Sig Dispense Refill   Acetaminophen (TYLENOL) 325 MG CAPS Take 1 capsule by mouth in the morning and at bedtime.     ALPRAZolam (XANAX) 0.25 MG tablet Take 0.25 mg by mouth 2 (two) times daily as needed for anxiety or sleep.   3   apixaban (ELIQUIS) 5 MG TABS tablet Take 1 tablet (5 mg total) by mouth 2 (two) times daily.     clotrimazole-betamethasone (LOTRISONE) cream Apply 1 Application  topically 2 (two) times daily.     Cyanocobalamin (VITAMIN B12 PO) Take 1 tablet by mouth daily.     D3-50 50000 units capsule Take 50,000 Units by mouth once a week. Usually Tuesday or Wednesday.  0   ELDERBERRY PO Take 1 tablet by mouth daily.     esomeprazole (NEXIUM) 40 MG capsule Take 40 mg by mouth daily at 12 noon.     gabapentin (NEURONTIN) 100 MG capsule Take 1 capsule (100 mg total) by mouth at bedtime.     levalbuterol (XOPENEX) 0.63 MG/3ML nebulizer solution Take 3 mLs (0.63 mg total) by nebulization every 6 (six) hours as needed for wheezing or shortness of breath. 75 mL 6   metFORMIN (GLUCOPHAGE) 500 MG tablet Take 1 tablet (500 mg total) by mouth daily with breakfast.     metoprolol succinate (TOPROL-XL) 25 MG 24 hr tablet TAKE 1 TABLET BY MOUTH TWICE A DAY 60 tablet 0   neomycin-polymyxin b-dexamethasone (MAXITROL) 3.5-10000-0.1 OINT 1 application at bedtime.     predniSONE (DELTASONE) 5 MG tablet Take 10 mg by mouth 2 (two) times daily with a meal.     triamcinolone (NASACORT) 55 MCG/ACT AERO nasal inhaler Place 1 spray into the nose daily as needed (Congestion).      zinc gluconate 50 MG tablet Take 50 mg by mouth  2 (two) times a week.     No current facility-administered medications for this visit.    PAST MEDICAL HISTORY: Past Medical History:  Diagnosis Date   Anxiety    Aortic atherosclerosis (HCC)    Atrial fibrillation (HCC)    Basal cell carcinoma    Bladder cancer (HCC)    Bladder tumor    Colon polyps    COPD (chronic obstructive pulmonary disease) (HCC)    Cutaneous vasculitis    Diabetic peripheral neuropathy (HCC) 12/27/2020   Diverticulosis    Essential hypertension    GERD (gastroesophageal reflux disease)    History of pneumonia    Nephrolithiasis    Osteoarthritis     PAST SURGICAL HISTORY: Past Surgical History:  Procedure Laterality Date   COLONOSCOPY     Kidney stones removed  1996   SKIN CANCER EXCISION Left    2003   TRANSURETHRAL  RESECTION OF BLADDER TUMOR  1996    FAMILY HISTORY: Family History  Problem Relation Age of Onset   Emphysema Father    Lung cancer Brother    Liver cancer Brother    Diabetes Mother    CAD Mother    Diabetes Sister    Stroke Sister    Colon cancer Neg Hx     SOCIAL HISTORY: Social History   Socioeconomic History   Marital status: Married    Spouse name: Not on file   Number of children: 3   Years of education: Not on file   Highest education level: Not on file  Occupational History   Occupation: Pipefilter/retired    Comment: [employer]  Tobacco Use   Smoking status: Former    Current packs/day: 0.00    Average packs/day: 1 pack/day for 30.0 years (30.0 ttl pk-yrs)    Types: Cigarettes    Start date: 07/06/1981    Quit date: 07/07/2011    Years since quitting: 11.8   Smokeless tobacco: Never  Vaping Use   Vaping status: Never Used  Substance and Sexual Activity   Alcohol use: No    Alcohol/week: 0.0 standard drinks of alcohol   Drug use: No   Sexual activity: Yes  Other Topics Concern   Not on file  Social History Narrative   Lives with wife   Caffeine- 1 cup a day   Right handed    Social Determinants of Health   Financial Resource Strain: Not on file  Food Insecurity: Low Risk  (04/18/2023)   Received from [facility]   Hunger Vital Sign    Worried About Running Out of Food in the Last Year: Never true    Ran Out of Food in the Last Year: Never true  Transportation Needs: No Transportation Needs (04/19/2023)   Received from [facility]   PRAPARE - Transportation    Lack of Transportation (Medical): No    Lack of Transportation (Non-Medical): No  Recent Concern: Transportation Needs - Unmet Transportation Needs (04/16/2023)   PRAPARE - Administrator, Civil Service (Medical): Yes    Lack of Transportation (Non-Medical): Yes  Physical Activity: Not on file  Stress: Not on file  Social Connections: Not on file  Intimate Partner  Violence: Not At Risk (04/16/2023)   Humiliation, Afraid, Rape, and Kick questionnaire    Fear of Current or Ex-Partner: No    Emotionally Abused: No    Physically Abused: No    Sexually Abused: No      Levert Feinstein, M.D. Ph.D.  [facility]  [address] [place], [place] [address] Ph: [phone] Fax: [phone]  CC:  Alinda Deem, MD [address] [place],  [place] [address]  Alinda Deem, MD

## 2023-05-14 ENCOUNTER — Encounter: Payer: Self-pay | Admitting: Cardiology

## 2023-05-14 ENCOUNTER — Ambulatory Visit: Payer: Medicare Other | Attending: Student | Admitting: Physician Assistant

## 2023-05-14 ENCOUNTER — Encounter: Payer: Self-pay | Admitting: Physician Assistant

## 2023-05-14 VITALS — BP 120/66 | HR 76 | Ht 71.0 in | Wt 154.0 lb

## 2023-05-14 DIAGNOSIS — I639 Cerebral infarction, unspecified: Secondary | ICD-10-CM

## 2023-05-14 DIAGNOSIS — I10 Essential (primary) hypertension: Secondary | ICD-10-CM | POA: Diagnosis not present

## 2023-05-14 DIAGNOSIS — I482 Chronic atrial fibrillation, unspecified: Secondary | ICD-10-CM

## 2023-05-14 DIAGNOSIS — J449 Chronic obstructive pulmonary disease, unspecified: Secondary | ICD-10-CM | POA: Diagnosis not present

## 2023-05-14 NOTE — Patient Instructions (Signed)
Medication Instructions:  Your physician recommends that you continue on your current medications as directed. Please refer to the Current Medication list given to you today.   Labwork: None today  Testing/Procedures: None today  Follow-Up: 4-5 months Dr.McDowell  Any Other Special Instructions Will Be Listed Below (If Applicable).  If you need a refill on your cardiac medications before your next appointment, please call your pharmacy.

## 2023-05-15 LAB — MULTIPLE MYELOMA PANEL, SERUM
Albumin SerPl Elph-Mcnc: 3.2 g/dL (ref 2.9–4.4)
Albumin/Glob SerPl: 1 (ref 0.7–1.7)
Alpha 1: 0.3 g/dL (ref 0.0–0.4)
Alpha2 Glob SerPl Elph-Mcnc: 0.8 g/dL (ref 0.4–1.0)
B-Globulin SerPl Elph-Mcnc: 1.1 g/dL (ref 0.7–1.3)
Gamma Glob SerPl Elph-Mcnc: 1.1 g/dL (ref 0.4–1.8)
Globulin, Total: 3.3 g/dL (ref 2.2–3.9)
IgA/Immunoglobulin A, Serum: 583 mg/dL — ABNORMAL HIGH (ref 61–437)
IgG (Immunoglobin G), Serum: 1112 mg/dL (ref 603–1613)
IgM (Immunoglobulin M), Srm: 45 mg/dL (ref 15–143)

## 2023-05-15 LAB — ANA W/REFLEX IF POSITIVE: Anti Nuclear Antibody (ANA): NEGATIVE

## 2023-05-15 LAB — COMPREHENSIVE METABOLIC PANEL
ALT: 15 [IU]/L (ref 0–44)
AST: 13 [IU]/L (ref 0–40)
Albumin: 3.7 g/dL — ABNORMAL LOW (ref 3.8–4.8)
Alkaline Phosphatase: 64 [IU]/L (ref 44–121)
BUN/Creatinine Ratio: 22 (ref 10–24)
BUN: 19 mg/dL (ref 8–27)
Bilirubin Total: 0.4 mg/dL (ref 0.0–1.2)
CO2: 27 mmol/L (ref 20–29)
Calcium: 9.4 mg/dL (ref 8.6–10.2)
Chloride: 99 mmol/L (ref 96–106)
Creatinine, Ser: 0.85 mg/dL (ref 0.76–1.27)
Globulin, Total: 2.8 g/dL (ref 1.5–4.5)
Glucose: 64 mg/dL — ABNORMAL LOW (ref 70–99)
Potassium: 4.4 mmol/L (ref 3.5–5.2)
Sodium: 139 mmol/L (ref 134–144)
Total Protein: 6.5 g/dL (ref 6.0–8.5)
eGFR: 88 mL/min/{1.73_m2} (ref >59)

## 2023-05-15 LAB — SEDIMENTATION RATE: Sed Rate: 19 mm/h (ref 0–30)

## 2023-05-15 LAB — C-REACTIVE PROTEIN: CRP: 6 mg/L (ref 0–10)

## 2023-05-15 LAB — TSH: TSH: 1.28 u[IU]/mL (ref 0.450–4.500)

## 2023-05-15 LAB — RPR: RPR Ser Ql: NONREACTIVE

## 2023-05-15 LAB — HGB A1C W/O EAG: Hgb A1c MFr Bld: 6.9 % — ABNORMAL HIGH (ref 4.8–5.6)

## 2023-05-16 ENCOUNTER — Telehealth: Payer: Self-pay | Admitting: Vascular Surgery

## 2023-05-16 NOTE — Progress Notes (Deleted)
Patient ID: Matthew Hall, male   DOB: 1945-09-01, 77 y.o.   MRN: [id number]  Reason for Consult: No chief complaint on file.   Referred by Alinda Deem, MD  Subjective:     HPI  Matthew Hall is a 77 y.o. male atrial fibrillation, hypertension, COPD, diabetes and CVA on 04/12/23 due to inconsistent INR's.  During his hospitalization a full vascular workup was obtained which demonstrated minimal carotid disease.  An ABI was also obtained due to difficulty palpating a DP pulse in the left.  This demonstrated normal ABIs bilaterally but ischemic toe pressures.  He denies claudication, rest pain or ulceration.  He is compliant with Eliquis, but is not on aspirin or statin.  Past Medical History:  Diagnosis Date   Anxiety    Aortic atherosclerosis (HCC)    Atrial fibrillation (HCC)    Basal cell carcinoma    Bladder cancer (HCC)    Bladder tumor    Colon polyps    COPD (chronic obstructive pulmonary disease) (HCC)    Cutaneous vasculitis    Diabetic peripheral neuropathy (HCC) 12/27/2020   Diverticulosis    Essential hypertension    GERD (gastroesophageal reflux disease)    History of pneumonia    Nephrolithiasis    Osteoarthritis    Family History  Problem Relation Age of Onset   Diabetes Mother    CAD Mother    Emphysema Father    Diabetes Sister    Stroke Sister    Lung cancer Brother    Liver cancer Brother    Colon cancer Neg Hx    Past Surgical History:  Procedure Laterality Date   COLONOSCOPY     Kidney stones removed  1996   SKIN CANCER EXCISION Left    2003   TRANSURETHRAL RESECTION OF BLADDER TUMOR  1996    Short Social History:  Social History   Tobacco Use   Smoking status: Former    Current packs/day: 0.00    Average packs/day: 1 pack/day for 30.0 years (30.0 ttl pk-yrs)    Types: Cigarettes    Start date: 07/06/1981    Quit date: 07/07/2011    Years since quitting: 11.8   Smokeless tobacco: Never  Substance Use Topics   Alcohol  use: No    Alcohol/week: 0.0 standard drinks of alcohol    Allergies  Allergen Reactions   Simvastatin Rash   Albuterol Palpitations   Augmentin [Amoxicillin-Pot Clavulanate] Rash   Ciprofloxacin Rash   Contrast Media [Iodinated Contrast Media] Rash   Flagyl [Metronidazole] Rash   Latex Hives and Rash   Sulfa Antibiotics Rash    Current Outpatient Medications  Medication Sig Dispense Refill   Acetaminophen (TYLENOL) 325 MG CAPS Take 1 capsule by mouth in the morning and at bedtime.     ALPRAZolam (XANAX) 0.25 MG tablet Take 0.25 mg by mouth 2 (two) times daily as needed for anxiety or sleep.   3   apixaban (ELIQUIS) 5 MG TABS tablet Take 1 tablet (5 mg total) by mouth 2 (two) times daily.     clotrimazole-betamethasone (LOTRISONE) cream Apply 1 Application topically 2 (two) times daily.     Cyanocobalamin (VITAMIN B12 PO) Take 1 tablet by mouth daily.     D3-50 50000 units capsule Take 50,000 Units by mouth once a week. Usually Tuesday or Wednesday.  0   DULoxetine (CYMBALTA) 60 MG capsule Take 1 capsule (60 mg total) by mouth daily. 30 capsule 11   ELDERBERRY PO Take 1  tablet by mouth daily.     esomeprazole (NEXIUM) 40 MG capsule Take 40 mg by mouth daily at 12 noon.     gabapentin (NEURONTIN) 100 MG capsule Take 3 capsules (300 mg total) by mouth at bedtime. 90 capsule 11   levalbuterol (XOPENEX) 0.63 MG/3ML nebulizer solution Take 3 mLs (0.63 mg total) by nebulization every 6 (six) hours as needed for wheezing or shortness of breath. 75 mL 6   metFORMIN (GLUCOPHAGE) 500 MG tablet Take 1 tablet (500 mg total) by mouth daily with breakfast.     metoprolol succinate (TOPROL-XL) 25 MG 24 hr tablet TAKE 1 TABLET BY MOUTH TWICE A DAY 60 tablet 0   neomycin-polymyxin b-dexamethasone (MAXITROL) 3.5-10000-0.1 OINT 1 application at bedtime.     predniSONE (DELTASONE) 5 MG tablet Take 10 mg by mouth 2 (two) times daily with a meal.     triamcinolone (NASACORT) 55 MCG/ACT AERO nasal inhaler  Place 1 spray into the nose daily as needed (Congestion).      zinc gluconate 50 MG tablet Take 50 mg by mouth 2 (two) times a week.     No current facility-administered medications for this visit.    REVIEW OF SYSTEMS  Negative other than noted in HPI     Objective:  Objective   There were no vitals filed for this visit. There is no height or weight on file to calculate BMI.  Physical Exam General: no acute distress Cardiac: hemodynamically stable, nontachycardic Pulm: normal work of breathing GI: non-tender, no pulsatile mass*** Neuro: alert, no focal deficit Extremities: no edema, cyanosis or wounds*** Vascular:   Right: ***  Left: ***   Data: ABI: 04/13/23 +---------+------------------+-----+-------------------+--------+  Right   Rt Pressure (mmHg)IndexWaveform           Comment   +---------+------------------+-----+-------------------+--------+  Brachial 111                    triphasic                    +---------+------------------+-----+-------------------+--------+  PTA     98                0.88 dampened monophasic          +---------+------------------+-----+-------------------+--------+  DP      110               0.99 biphasic                     +---------+------------------+-----+-------------------+--------+  Great Toe66                0.59 Normal                       +---------+------------------+-----+-------------------+--------+   +---------+------------------+-----+---------+-------+  Left    Lt Pressure (mmHg)IndexWaveform Comment  +---------+------------------+-----+---------+-------+  Brachial 109                    triphasic         +---------+------------------+-----+---------+-------+  PTA     115               1.04 triphasic         +---------+------------------+-----+---------+-------+  DP      107               0.96 biphasic           +---------+------------------+-----+---------+-------+  Oda Cogan  0.00 Absent            +---------+------------------+-----+---------+-------+   Carotid duplex: 04/13/23 1 to 39% bilaterally  Echo: 04/13/23 EF 40-45%, no rwma, moderate TV regurg     Assessment/Plan:     Matthew Hall is a 77 y.o. male ***    Recommendations to optimize cardiovascular risk: Abstinence from all tobacco products. Blood glucose control with goal A1c < 7%. Blood pressure control with goal blood pressure < 140/90 mmHg. Lipid reduction therapy with goal LDL-C <100 mg/dL  Aspirin 81mg  PO QD.  Atorvastatin 40-80mg  PO QD (or other "high intensity" statin therapy).     Daria Pastures MD [facility]

## 2023-05-16 NOTE — Telephone Encounter (Signed)
Appt rescheduled

## 2023-05-18 ENCOUNTER — Encounter: Payer: Medicare Other | Admitting: Vascular Surgery

## 2023-05-23 ENCOUNTER — Telehealth: Payer: Self-pay | Admitting: Neurology

## 2023-05-23 NOTE — Telephone Encounter (Signed)
no auth required [employer] case [id number] sent to [facility] [phone]

## 2023-05-28 NOTE — Progress Notes (Deleted)
Patient ID: Matthew Hall, male   DOB: [birth year], 77 y.o.   MRN: [id number]  Reason for Consult: No chief complaint on file.   Referred by Alinda Deem, MD  Subjective:     HPI  Matthew Hall is a 76 y.o. male atrial fibrillation, hypertension, COPD, diabetes and CVA on 04/12/23 due to inconsistent INR's.  During his hospitalization a full vascular workup was obtained which demonstrated minimal carotid disease.  An ABI was also obtained due to difficulty palpating a DP pulse in the left.  This demonstrated normal ABIs bilaterally but ischemic toe pressures.  He denies claudication, rest pain or ulceration.  He is compliant with Eliquis, but is not on aspirin or statin.  Past Medical History:  Diagnosis Date   Anxiety    Aortic atherosclerosis (HCC)    Atrial fibrillation (HCC)    Basal cell carcinoma    Bladder cancer (HCC)    Bladder tumor    Colon polyps    COPD (chronic obstructive pulmonary disease) (HCC)    Cutaneous vasculitis    Diabetic peripheral neuropathy (HCC) 12/27/2020   Diverticulosis    Essential hypertension    GERD (gastroesophageal reflux disease)    History of pneumonia    Nephrolithiasis    Osteoarthritis    Family History  Problem Relation Age of Onset   Diabetes Mother    CAD Mother    Emphysema Father    Diabetes Sister    Stroke Sister    Lung cancer Brother    Liver cancer Brother    Colon cancer Neg Hx    Past Surgical History:  Procedure Laterality Date   COLONOSCOPY     Kidney stones removed  1996   SKIN CANCER EXCISION Left    2003   TRANSURETHRAL RESECTION OF BLADDER TUMOR  1996    Short Social History:  Social History   Tobacco Use   Smoking status: Former    Current packs/day: 0.00    Average packs/day: 1 pack/day for 30.0 years (30.0 ttl pk-yrs)    Types: Cigarettes    Start date: 07/06/1981    Quit date: 07/07/2011    Years since quitting: 11.8   Smokeless tobacco: Never  Substance Use Topics   Alcohol  use: No    Alcohol/week: 0.0 standard drinks of alcohol    Allergies  Allergen Reactions   Simvastatin Rash   Albuterol Palpitations   Augmentin [Amoxicillin-Pot Clavulanate] Rash   Ciprofloxacin Rash   Contrast Media [Iodinated Contrast Media] Rash   Flagyl [Metronidazole] Rash   Latex Hives and Rash   Sulfa Antibiotics Rash    Current Outpatient Medications  Medication Sig Dispense Refill   Acetaminophen (TYLENOL) 325 MG CAPS Take 1 capsule by mouth in the morning and at bedtime.     ALPRAZolam (XANAX) 0.25 MG tablet Take 0.25 mg by mouth 2 (two) times daily as needed for anxiety or sleep.   3   apixaban (ELIQUIS) 5 MG TABS tablet Take 1 tablet (5 mg total) by mouth 2 (two) times daily.     clotrimazole-betamethasone (LOTRISONE) cream Apply 1 Application topically 2 (two) times daily.     Cyanocobalamin (VITAMIN B12 PO) Take 1 tablet by mouth daily.     D3-50 50000 units capsule Take 50,000 Units by mouth once a week. Usually Tuesday or Wednesday.  0   DULoxetine (CYMBALTA) 60 MG capsule Take 1 capsule (60 mg total) by mouth daily. 30 capsule 11   ELDERBERRY PO Take 1  tablet by mouth daily.     esomeprazole (NEXIUM) 40 MG capsule Take 40 mg by mouth daily at 12 noon.     gabapentin (NEURONTIN) 100 MG capsule Take 3 capsules (300 mg total) by mouth at bedtime. 90 capsule 11   levalbuterol (XOPENEX) 0.63 MG/3ML nebulizer solution Take 3 mLs (0.63 mg total) by nebulization every 6 (six) hours as needed for wheezing or shortness of breath. 75 mL 6   metFORMIN (GLUCOPHAGE) 500 MG tablet Take 1 tablet (500 mg total) by mouth daily with breakfast.     metoprolol succinate (TOPROL-XL) 25 MG 24 hr tablet TAKE 1 TABLET BY MOUTH TWICE A DAY 60 tablet 0   neomycin-polymyxin b-dexamethasone (MAXITROL) 3.5-10000-0.1 OINT 1 application at bedtime.     predniSONE (DELTASONE) 5 MG tablet Take 10 mg by mouth 2 (two) times daily with a meal.     triamcinolone (NASACORT) 55 MCG/ACT AERO nasal inhaler  Place 1 spray into the nose daily as needed (Congestion).      zinc gluconate 50 MG tablet Take 50 mg by mouth 2 (two) times a week.     No current facility-administered medications for this visit.    REVIEW OF SYSTEMS  Negative other than noted in HPI     Objective:  Objective   There were no vitals filed for this visit. There is no height or weight on file to calculate BMI.  ***   Data: ABI: 04/13/23 +---------+------------------+-----+-------------------+--------+  Right   Rt Pressure (mmHg)IndexWaveform           Comment   +---------+------------------+-----+-------------------+--------+  Brachial 111                    triphasic                    +---------+------------------+-----+-------------------+--------+  PTA     98                0.88 dampened monophasic          +---------+------------------+-----+-------------------+--------+  DP      110               0.99 biphasic                     +---------+------------------+-----+-------------------+--------+  Great Toe66                0.59 Normal                       +---------+------------------+-----+-------------------+--------+   +---------+------------------+-----+---------+-------+  Left    Lt Pressure (mmHg)IndexWaveform Comment  +---------+------------------+-----+---------+-------+  Brachial 109                    triphasic         +---------+------------------+-----+---------+-------+  PTA     115               1.04 triphasic         +---------+------------------+-----+---------+-------+  DP      107               0.96 biphasic          +---------+------------------+-----+---------+-------+  Great Toe0                 0.00 Absent            +---------+------------------+-----+---------+-------+   Carotid duplex: 04/13/23 1 to 39% bilaterally  Echo: 04/13/23 EF 40-45%, no rwma, moderate TV  regurg     Assessment/Plan:     Matthew Hall  is a 77 y.o. male ***    Recommendations to optimize cardiovascular risk: Abstinence from all tobacco products. Blood glucose control with goal A1c < 7%. Blood pressure control with goal blood pressure < 140/90 mmHg. Lipid reduction therapy with goal LDL-C <100 mg/dL  Aspirin 81mg  PO QD.  Atorvastatin 40-80mg  PO QD (or other "high intensity" statin therapy).     Leonie Douglas MD [facility]

## 2023-05-29 ENCOUNTER — Encounter: Payer: Medicare Other | Admitting: Vascular Surgery

## 2023-05-30 ENCOUNTER — Ambulatory Visit: Payer: Medicare Other | Admitting: Student

## 2023-06-01 ENCOUNTER — Other Ambulatory Visit: Payer: Self-pay | Admitting: Neurology

## 2023-06-01 ENCOUNTER — Other Ambulatory Visit: Payer: Self-pay | Admitting: Nurse Practitioner

## 2023-06-08 ENCOUNTER — Encounter (HOSPITAL_COMMUNITY): Payer: Self-pay

## 2023-06-08 ENCOUNTER — Other Ambulatory Visit: Payer: Self-pay

## 2023-06-08 ENCOUNTER — Emergency Department (HOSPITAL_COMMUNITY)
Admission: EM | Admit: 2023-06-08 | Discharge: 2023-06-08 | Disposition: A | Discharge status: Home / Self Care | Payer: Medicare Other | Attending: Emergency Medicine | Admitting: Emergency Medicine

## 2023-06-08 DIAGNOSIS — Z79899 Other long term (current) drug therapy: Secondary | ICD-10-CM | POA: Insufficient documentation

## 2023-06-08 DIAGNOSIS — Z7951 Long term (current) use of inhaled steroids: Secondary | ICD-10-CM | POA: Insufficient documentation

## 2023-06-08 DIAGNOSIS — J439 Emphysema, unspecified: Secondary | ICD-10-CM | POA: Diagnosis not present

## 2023-06-08 DIAGNOSIS — Z7901 Long term (current) use of anticoagulants: Secondary | ICD-10-CM | POA: Insufficient documentation

## 2023-06-08 DIAGNOSIS — Z7952 Long term (current) use of systemic steroids: Secondary | ICD-10-CM | POA: Diagnosis not present

## 2023-06-08 DIAGNOSIS — Z87891 Personal history of nicotine dependence: Secondary | ICD-10-CM | POA: Diagnosis not present

## 2023-06-08 DIAGNOSIS — Z91040 Latex allergy status: Secondary | ICD-10-CM | POA: Diagnosis not present

## 2023-06-08 DIAGNOSIS — R059 Cough, unspecified: Secondary | ICD-10-CM | POA: Diagnosis present

## 2023-06-08 DIAGNOSIS — E119 Type 2 diabetes mellitus without complications: Secondary | ICD-10-CM | POA: Diagnosis not present

## 2023-06-08 DIAGNOSIS — Z7984 Long term (current) use of oral hypoglycemic drugs: Secondary | ICD-10-CM | POA: Diagnosis not present

## 2023-06-08 DIAGNOSIS — I10 Essential (primary) hypertension: Secondary | ICD-10-CM | POA: Diagnosis not present

## 2023-06-08 DIAGNOSIS — Z20822 Contact with and (suspected) exposure to covid-19: Secondary | ICD-10-CM | POA: Diagnosis not present

## 2023-06-08 DIAGNOSIS — J441 Chronic obstructive pulmonary disease with (acute) exacerbation: Secondary | ICD-10-CM | POA: Diagnosis not present

## 2023-06-08 LAB — RESP PANEL BY RT-PCR (RSV, FLU A&B, COVID)  RVPGX2
Influenza A by PCR: NEGATIVE
Influenza B by PCR: NEGATIVE
Resp Syncytial Virus by PCR: NEGATIVE
SARS Coronavirus 2 by RT PCR: NEGATIVE

## 2023-06-08 MED ORDER — LEVALBUTEROL HCL 1.25 MG/0.5ML IN NEBU
1.2500 mg | INHALATION_SOLUTION | Freq: Once | RESPIRATORY_TRACT | Status: AC
  Administered 2023-06-08: 1.25 mg via RESPIRATORY_TRACT
  Filled 2023-06-08: qty 0.5

## 2023-06-08 MED ORDER — AZITHROMYCIN 250 MG PO TABS: 250.0000 mg | ORAL_TABLET | Freq: Every day | ORAL | 0 refills | Status: DC

## 2023-06-08 MED ORDER — BENZONATATE 100 MG PO CAPS
200.0000 mg | ORAL_CAPSULE | Freq: Three times a day (TID) | ORAL | 0 refills | Status: DC | PRN
Start: 2023-06-08 — End: 2023-06-22

## 2023-06-08 NOTE — ED Provider Notes (Signed)
[place] EMERGENCY DEPARTMENT AT [facility] Provider Note   CSN: [id number] Arrival date & time: 06/08/23  1125     History  Chief Complaint  Patient presents with   Cough    Matthew Hall is a 77 y.o. male with history including atrial fibrillation, hypertension, emphysema and bronchiectasis, patient is a former smoker, also history of diabetes presenting for evaluation of suspected COPD exacerbation.  He describes cough which has been productive of clear sputum, he states he had similar symptoms 2 weeks ago and took a full course of Levaquin after which his cough improved, but then had to spend some hours in a crowded medical waiting room and feels he picked up a respiratory virus that day.  Of note, his wife is also here with similar symptoms.  He is currently on a prednisone taper prescribed by his PCP he is currently on 30 mg daily.  He denies orthopnea, no fevers or chills, no chest pain, nausea vomiting or diaphoresis.  Uses Xopenex at home as he does not tolerate albuterol which makes him very tachycardic.  He has had a treatment this morning with transient improvement in his symptoms.  The history is provided by the patient.       Home Medications Prior to Admission medications   Medication Sig Start Date End Date Taking? Authorizing Provider  azithromycin (ZITHROMAX) 250 MG tablet Take 1 tablet (250 mg total) by mouth daily. Take first 2 tablets together, then 1 every day until finished. 06/08/23  Yes Srihan Brutus, Raynelle Fanning, PA-C  benzonatate (TESSALON) 100 MG capsule Take 2 capsules (200 mg total) by mouth 3 (three) times daily as needed. 06/08/23  Yes Robbert Langlinais, Raynelle Fanning, PA-C  Acetaminophen (TYLENOL) 325 MG CAPS Take 1 capsule by mouth in the morning and at bedtime.    [provider]  ALPRAZolam Prudy Feeler) 0.25 MG tablet Take 0.25 mg by mouth 2 (two) times daily as needed for anxiety or sleep.  07/01/14   [provider]  apixaban (ELIQUIS) 5 MG TABS tablet  Take 1 tablet (5 mg total) by mouth 2 (two) times daily. 04/17/23   Zigmund Daniel., MD  clotrimazole-betamethasone (LOTRISONE) cream Apply 1 Application topically 2 (two) times daily. 04/04/23   [provider]  Cyanocobalamin (VITAMIN B12 PO) Take 1 tablet by mouth daily.    [provider]  D3-50 50000 units capsule Take 50,000 Units by mouth once a week. Usually Tuesday or Wednesday. 11/23/17   [provider]  DULoxetine (CYMBALTA) 60 MG capsule TAKE 1 CAPSULE BY MOUTH EVERY DAY 06/04/23   Levert Feinstein, MD  ELDERBERRY PO Take 1 tablet by mouth daily.    [provider]  esomeprazole (NEXIUM) 40 MG capsule Take 40 mg by mouth daily at 12 noon.    [provider]  gabapentin (NEURONTIN) 100 MG capsule Take 3 capsules (300 mg total) by mouth at bedtime. 05/09/23   Levert Feinstein, MD  levalbuterol Pauline Aus) 0.63 MG/3ML nebulizer solution Take 3 mLs (0.63 mg total) by nebulization every 6 (six) hours as needed for wheezing or shortness of breath. 07/25/19   Coral Ceo, NP  metFORMIN (GLUCOPHAGE) 500 MG tablet Take 1 tablet (500 mg total) by mouth daily with breakfast. 04/18/23   Zigmund Daniel., MD  metoprolol succinate (TOPROL-XL) 25 MG 24 hr tablet TAKE 1 TABLET BY MOUTH TWICE A DAY 06/01/23   Sharlene Dory, NP  neomycin-polymyxin b-dexamethasone (MAXITROL) 3.5-10000-0.1 OINT 1 application at bedtime. 06/24/20  [provider]  predniSONE (DELTASONE) 5 MG tablet Take 10 mg by mouth 2 (two) times daily with a meal.    [provider]  triamcinolone (NASACORT) 55 MCG/ACT AERO nasal inhaler Place 1 spray into the nose daily as needed (Congestion).     [provider]  zinc gluconate 50 MG tablet Take 50 mg by mouth 2 (two) times a week.    [provider]      Allergies    Simvastatin, Albuterol, Augmentin [amoxicillin-pot clavulanate], Ciprofloxacin, Contrast media [iodinated contrast media], Flagyl  [metronidazole], Latex, and Sulfa antibiotics    Review of Systems   Review of Systems  Constitutional:  Negative for fever.  HENT:  Negative for congestion and sore throat.   Eyes: Negative.   Respiratory:  Positive for cough, shortness of breath and wheezing. Negative for chest tightness.   Cardiovascular:  Negative for chest pain, palpitations and leg swelling.  Gastrointestinal:  Negative for abdominal pain, nausea and vomiting.  Genitourinary: Negative.   Musculoskeletal:  Negative for arthralgias, joint swelling and neck pain.  Skin: Negative.  Negative for rash and wound.  Neurological:  Negative for dizziness, weakness, light-headedness, numbness and headaches.  Psychiatric/Behavioral: Negative.    All other systems reviewed and are negative.   Physical Exam Updated Vital Signs BP (!) 154/85 (BP Location: Right Arm)   Pulse 70   Temp 98.3 F (36.8 C) (Oral)   Resp 16   Ht 5\' 11"  (1.803 m)   Wt 70.8 kg   SpO2 98%   BMI 21.76 kg/m  Physical Exam Vitals and nursing note reviewed.  Constitutional:      Appearance: He is well-developed.  HENT:     Head: Normocephalic and atraumatic.  Eyes:     Conjunctiva/sclera: Conjunctivae normal.  Cardiovascular:     Rate and Rhythm: Normal rate and regular rhythm.     Heart sounds: Normal heart sounds.  Pulmonary:     Effort: Pulmonary effort is normal.     Breath sounds: Wheezing present. No rhonchi.     Comments: Prolonged expirations. Abdominal:     General: Bowel sounds are normal.     Palpations: Abdomen is soft.     Tenderness: There is no abdominal tenderness.  Musculoskeletal:        General: Normal range of motion.     Cervical back: Normal range of motion.     Right lower leg: No edema.     Left lower leg: No edema.  Skin:    General: Skin is warm and dry.  Neurological:     Mental Status: He is alert.     ED Results / Procedures / Treatments   Labs (all labs ordered are listed, but only abnormal  results are displayed) Labs Reviewed  RESP PANEL BY RT-PCR (RSV, FLU A&B, COVID)  [id number]    EKG None  Radiology No results found.  Procedures Procedures    Medications Ordered in ED Medications  levalbuterol (XOPENEX) nebulizer solution 1.25 mg (1.25 mg Nebulization Given 06/08/23 1431)    ED Course/ Medical Decision Making/ A&P                                 Medical Decision Making Patient with suspected COPD flare, he does have some prolonged expirations and expiratory wheeze, his vital signs have been stable here however, he was ambulated in the department and he did not desaturate and he  felt well after receiving his Xopenex treatment.  After his treatment his lungs were clear, no wheezing, no suggestion of pneumonia.  He does state he generally requires an antibiotic with these flares however, his last treatment was over a month ago with Levaquin.  Clinically no pneumonia, but with given history will place on a Z-Pak.  Patient stable at time of discharge.  Risk Prescription drug management.           Final Clinical Impression(s) / ED Diagnoses Final diagnoses:  Chronic obstructive pulmonary disease with acute exacerbation (HCC)    Rx / DC Orders ED Discharge Orders          Ordered    azithromycin (ZITHROMAX) 250 MG tablet  Daily        06/08/23 1628    benzonatate (TESSALON) 100 MG capsule  3 times daily PRN        06/08/23 1628              Burgess Amor, PA-C 06/08/23 1629    Vanetta Mulders, MD 06/16/23 1540

## 2023-06-08 NOTE — Discharge Instructions (Signed)
The entire course of the antibiotics prescribed.  Use your Xopenex inhaler per your MDs instructions as needed for wheezing and shortness of breath.  Also prescribed you medicine to help you with your cough.  Plan close recheck if your symptoms are not improving with this treatment plan.

## 2023-06-08 NOTE — ED Notes (Signed)
Patient ambulating in the hallway, no acute respiratory distress noted. O2 sat 93% on RA while ambulating

## 2023-06-08 NOTE — ED Triage Notes (Signed)
Patient presents to ED via POV with c/o productive cough. Symptoms presented a couple of weeks ago. Started Levaquin two weeks ago, cough had improved . Patient reports symptoms has been getting worse over the last  three days and cough is not improving. Patient has a hx of COPD.

## 2023-06-13 ENCOUNTER — Telehealth: Payer: Self-pay | Admitting: Cardiology

## 2023-06-13 NOTE — Telephone Encounter (Signed)
Pt c/o medication issue:  1. Name of Medication:   metoprolol succinate (TOPROL-XL) 25 MG 24 hr tablet    2. How are you currently taking this medication (dosage and times per day)?  TAKE 1 TABLET BY MOUTH TWICE A DAY       3. Are you having a reaction (difficulty breathing--STAT)? No  4. What is your medication issue? Pt stated he has a prescription for 50mg  and 25mg  for this medication and is requesting a callback to see which one he should be taking. Please advise

## 2023-06-13 NOTE — Telephone Encounter (Signed)
Per patients medication list - metoprolol succinate 25 mg BID.    Left a message for patient to call office back regarding medication

## 2023-06-14 NOTE — Telephone Encounter (Signed)
Patient notified to contact PCP regarding increase in medication.

## 2023-06-14 NOTE — Telephone Encounter (Signed)
Left a message for patient to call back regarding medication.

## 2023-06-14 NOTE — Telephone Encounter (Signed)
Spoke to patient who stated that PCP increased his metoprolol to 50 mg BID around 6 weeks ago. Pt is questioning if he should continue 25 mg BID or 50 mg BID.   Please advise.

## 2023-06-17 ENCOUNTER — Emergency Department (HOSPITAL_COMMUNITY): Payer: Medicare Other

## 2023-06-17 ENCOUNTER — Inpatient Hospital Stay (HOSPITAL_COMMUNITY)
Admission: EM | Admit: 2023-06-17 | Discharge: 2023-06-22 | DRG: 871 | Disposition: A | Discharge status: Home Health | Payer: Medicare Other | Attending: Internal Medicine | Admitting: Internal Medicine

## 2023-06-17 ENCOUNTER — Inpatient Hospital Stay (HOSPITAL_COMMUNITY): Payer: Medicare Other

## 2023-06-17 ENCOUNTER — Other Ambulatory Visit: Payer: Self-pay

## 2023-06-17 ENCOUNTER — Encounter (HOSPITAL_COMMUNITY): Payer: Self-pay | Admitting: Emergency Medicine

## 2023-06-17 DIAGNOSIS — R634 Abnormal weight loss: Secondary | ICD-10-CM | POA: Diagnosis present

## 2023-06-17 DIAGNOSIS — I10 Essential (primary) hypertension: Secondary | ICD-10-CM | POA: Diagnosis present

## 2023-06-17 DIAGNOSIS — R627 Adult failure to thrive: Secondary | ICD-10-CM | POA: Diagnosis present

## 2023-06-17 DIAGNOSIS — Z823 Family history of stroke: Secondary | ICD-10-CM

## 2023-06-17 DIAGNOSIS — I4821 Permanent atrial fibrillation: Secondary | ICD-10-CM | POA: Diagnosis present

## 2023-06-17 DIAGNOSIS — Z8551 Personal history of malignant neoplasm of bladder: Secondary | ICD-10-CM

## 2023-06-17 DIAGNOSIS — A419 Sepsis, unspecified organism: Secondary | ICD-10-CM | POA: Diagnosis present

## 2023-06-17 DIAGNOSIS — Z87442 Personal history of urinary calculi: Secondary | ICD-10-CM

## 2023-06-17 DIAGNOSIS — Z833 Family history of diabetes mellitus: Secondary | ICD-10-CM

## 2023-06-17 DIAGNOSIS — Z800 Family history of malignant neoplasm of digestive organs: Secondary | ICD-10-CM

## 2023-06-17 DIAGNOSIS — J441 Chronic obstructive pulmonary disease with (acute) exacerbation: Secondary | ICD-10-CM | POA: Diagnosis present

## 2023-06-17 DIAGNOSIS — Z87891 Personal history of nicotine dependence: Secondary | ICD-10-CM

## 2023-06-17 DIAGNOSIS — J470 Bronchiectasis with acute lower respiratory infection: Secondary | ICD-10-CM | POA: Diagnosis present

## 2023-06-17 DIAGNOSIS — E785 Hyperlipidemia, unspecified: Secondary | ICD-10-CM | POA: Diagnosis present

## 2023-06-17 DIAGNOSIS — Z8249 Family history of ischemic heart disease and other diseases of the circulatory system: Secondary | ICD-10-CM

## 2023-06-17 DIAGNOSIS — J471 Bronchiectasis with (acute) exacerbation: Secondary | ICD-10-CM

## 2023-06-17 DIAGNOSIS — Z91040 Latex allergy status: Secondary | ICD-10-CM

## 2023-06-17 DIAGNOSIS — Z882 Allergy status to sulfonamides status: Secondary | ICD-10-CM

## 2023-06-17 DIAGNOSIS — Z515 Encounter for palliative care: Secondary | ICD-10-CM | POA: Diagnosis not present

## 2023-06-17 DIAGNOSIS — E1142 Type 2 diabetes mellitus with diabetic polyneuropathy: Secondary | ICD-10-CM | POA: Diagnosis present

## 2023-06-17 DIAGNOSIS — F419 Anxiety disorder, unspecified: Secondary | ICD-10-CM | POA: Diagnosis present

## 2023-06-17 DIAGNOSIS — J440 Chronic obstructive pulmonary disease with acute lower respiratory infection: Secondary | ICD-10-CM | POA: Diagnosis present

## 2023-06-17 DIAGNOSIS — E876 Hypokalemia: Secondary | ICD-10-CM | POA: Diagnosis present

## 2023-06-17 DIAGNOSIS — Z825 Family history of asthma and other chronic lower respiratory diseases: Secondary | ICD-10-CM

## 2023-06-17 DIAGNOSIS — Z801 Family history of malignant neoplasm of trachea, bronchus and lung: Secondary | ICD-10-CM

## 2023-06-17 DIAGNOSIS — R5381 Other malaise: Secondary | ICD-10-CM | POA: Insufficient documentation

## 2023-06-17 DIAGNOSIS — J479 Bronchiectasis, uncomplicated: Secondary | ICD-10-CM | POA: Diagnosis not present

## 2023-06-17 DIAGNOSIS — Z85828 Personal history of other malignant neoplasm of skin: Secondary | ICD-10-CM | POA: Diagnosis not present

## 2023-06-17 DIAGNOSIS — Z7984 Long term (current) use of oral hypoglycemic drugs: Secondary | ICD-10-CM

## 2023-06-17 DIAGNOSIS — J159 Unspecified bacterial pneumonia: Secondary | ICD-10-CM | POA: Diagnosis present

## 2023-06-17 DIAGNOSIS — J189 Pneumonia, unspecified organism: Principal | ICD-10-CM | POA: Diagnosis present

## 2023-06-17 DIAGNOSIS — Z8673 Personal history of transient ischemic attack (TIA), and cerebral infarction without residual deficits: Secondary | ICD-10-CM | POA: Diagnosis not present

## 2023-06-17 DIAGNOSIS — M21371 Foot drop, right foot: Secondary | ICD-10-CM | POA: Diagnosis present

## 2023-06-17 DIAGNOSIS — I429 Cardiomyopathy, unspecified: Secondary | ICD-10-CM | POA: Diagnosis present

## 2023-06-17 DIAGNOSIS — R4781 Slurred speech: Secondary | ICD-10-CM | POA: Diagnosis present

## 2023-06-17 DIAGNOSIS — Z7901 Long term (current) use of anticoagulants: Secondary | ICD-10-CM

## 2023-06-17 DIAGNOSIS — J449 Chronic obstructive pulmonary disease, unspecified: Secondary | ICD-10-CM | POA: Diagnosis not present

## 2023-06-17 DIAGNOSIS — Z6822 Body mass index (BMI) 22.0-22.9, adult: Secondary | ICD-10-CM

## 2023-06-17 DIAGNOSIS — K219 Gastro-esophageal reflux disease without esophagitis: Secondary | ICD-10-CM | POA: Diagnosis present

## 2023-06-17 DIAGNOSIS — Z860100 Personal history of colon polyps, unspecified: Secondary | ICD-10-CM

## 2023-06-17 DIAGNOSIS — Z888 Allergy status to other drugs, medicaments and biological substances status: Secondary | ICD-10-CM

## 2023-06-17 DIAGNOSIS — Z7189 Other specified counseling: Secondary | ICD-10-CM | POA: Diagnosis not present

## 2023-06-17 DIAGNOSIS — Z1152 Encounter for screening for COVID-19: Secondary | ICD-10-CM | POA: Diagnosis not present

## 2023-06-17 DIAGNOSIS — Z79899 Other long term (current) drug therapy: Secondary | ICD-10-CM

## 2023-06-17 DIAGNOSIS — Z91041 Radiographic dye allergy status: Secondary | ICD-10-CM

## 2023-06-17 LAB — CBC
HCT: 41.6 % (ref 39.0–52.0)
Hemoglobin: 12.9 g/dL — ABNORMAL LOW (ref 13.0–17.0)
MCH: 30 pg (ref 26.0–34.0)
MCHC: 31 g/dL (ref 30.0–36.0)
MCV: 96.7 fL (ref 80.0–100.0)
Platelets: 323 10*3/uL (ref 150–400)
RBC: 4.3 MIL/uL (ref 4.22–5.81)
RDW: 14.7 % (ref 11.5–15.5)
WBC: 15.3 10*3/uL — ABNORMAL HIGH (ref 4.0–10.5)
nRBC: 0 % (ref 0.0–0.2)

## 2023-06-17 LAB — CBG MONITORING, ED: Glucose-Capillary: 129 mg/dL — ABNORMAL HIGH (ref 70–99)

## 2023-06-17 LAB — I-STAT CG4 LACTIC ACID, ED: Lactic Acid, Venous: 1.4 mmol/L (ref 0.5–1.9)

## 2023-06-17 LAB — HEPATIC FUNCTION PANEL
ALT: 14 U/L (ref 0–44)
AST: 13 U/L — ABNORMAL LOW (ref 15–41)
Albumin: 2.4 g/dL — ABNORMAL LOW (ref 3.5–5.0)
Alkaline Phosphatase: 43 U/L (ref 38–126)
Bilirubin, Direct: 0.3 mg/dL — ABNORMAL HIGH (ref 0.0–0.2)
Indirect Bilirubin: 0.9 mg/dL (ref 0.3–0.9)
Total Bilirubin: 1.2 mg/dL — ABNORMAL HIGH (ref <1.2)
Total Protein: 6.5 g/dL (ref 6.5–8.1)

## 2023-06-17 LAB — BASIC METABOLIC PANEL
Anion gap: 11 (ref 5–15)
BUN: 13 mg/dL (ref 8–23)
CO2: 24 mmol/L (ref 22–32)
Calcium: 8.8 mg/dL — ABNORMAL LOW (ref 8.9–10.3)
Chloride: 101 mmol/L (ref 98–111)
Creatinine, Ser: 0.95 mg/dL (ref 0.61–1.24)
GFR, Estimated: >60 mL/min (ref >60)
Glucose, Bld: 124 mg/dL — ABNORMAL HIGH (ref 70–99)
Potassium: 3.6 mmol/L (ref 3.5–5.1)
Sodium: 136 mmol/L (ref 135–145)

## 2023-06-17 LAB — MRSA NEXT GEN BY PCR, NASAL: MRSA by PCR Next Gen: DETECTED — AB

## 2023-06-17 MED ORDER — GUAIFENESIN ER 600 MG PO TB12
600.0000 mg | ORAL_TABLET | Freq: Two times a day (BID) | ORAL | Status: DC
  Administered 2023-06-17 – 2023-06-22 (×10): 600 mg via ORAL
  Filled 2023-06-17 (×11): qty 1

## 2023-06-17 MED ORDER — INSULIN ASPART 100 UNIT/ML IJ SOLN
0.0000 [IU] | Freq: Three times a day (TID) | INTRAMUSCULAR | Status: DC
  Administered 2023-06-18: 1 [IU] via SUBCUTANEOUS

## 2023-06-17 MED ORDER — ALPRAZOLAM 0.25 MG PO TABS
0.2500 mg | ORAL_TABLET | Freq: Two times a day (BID) | ORAL | Status: DC | PRN
  Administered 2023-06-18: 0.25 mg via ORAL
  Filled 2023-06-17: qty 1

## 2023-06-17 MED ORDER — SODIUM CHLORIDE 0.9 % IV SOLN
2.0000 g | Freq: Once | INTRAVENOUS | Status: AC
Start: 2023-06-17 — End: 2023-06-17
  Administered 2023-06-17: 2 g via INTRAVENOUS
  Filled 2023-06-17: qty 12.5

## 2023-06-17 MED ORDER — METOPROLOL SUCCINATE ER 25 MG PO TB24
25.0000 mg | ORAL_TABLET | Freq: Two times a day (BID) | ORAL | Status: DC
  Administered 2023-06-17 – 2023-06-22 (×10): 25 mg via ORAL
  Filled 2023-06-17 (×10): qty 1

## 2023-06-17 MED ORDER — VANCOMYCIN HCL IN DEXTROSE 1-5 GM/200ML-% IV SOLN: 1000.0000 mg | Freq: Once | INTRAVENOUS | Status: DC

## 2023-06-17 MED ORDER — ACETAMINOPHEN 500 MG PO TABS: 1000.0000 mg | ORAL_TABLET | Freq: Four times a day (QID) | ORAL | Status: DC | PRN

## 2023-06-17 MED ORDER — VANCOMYCIN HCL 1250 MG/250ML IV SOLN
1250.0000 mg | Freq: Once | INTRAVENOUS | Status: AC
  Administered 2023-06-17: 1250 mg via INTRAVENOUS
  Filled 2023-06-17: qty 250

## 2023-06-17 MED ORDER — VANCOMYCIN HCL 750 MG/150ML IV SOLN
750.0000 mg | Freq: Two times a day (BID) | INTRAVENOUS | Status: DC
  Administered 2023-06-18 – 2023-06-20 (×6): 750 mg via INTRAVENOUS
  Filled 2023-06-17 (×10): qty 150

## 2023-06-17 MED ORDER — SODIUM CHLORIDE 0.9% FLUSH
3.0000 mL | Freq: Two times a day (BID) | INTRAVENOUS | Status: DC
  Administered 2023-06-17 – 2023-06-22 (×9): 3 mL via INTRAVENOUS

## 2023-06-17 MED ORDER — PANTOPRAZOLE SODIUM 40 MG PO TBEC
40.0000 mg | DELAYED_RELEASE_TABLET | Freq: Every day | ORAL | Status: DC
  Administered 2023-06-18 – 2023-06-22 (×5): 40 mg via ORAL
  Filled 2023-06-17 (×5): qty 1

## 2023-06-17 MED ORDER — LEVALBUTEROL TARTRATE 45 MCG/ACT IN AERO
2.0000 | INHALATION_SPRAY | Freq: Four times a day (QID) | RESPIRATORY_TRACT | Status: DC | PRN
  Administered 2023-06-18: 2 via RESPIRATORY_TRACT
  Filled 2023-06-17: qty 15

## 2023-06-17 MED ORDER — SODIUM CHLORIDE 0.9 % IV SOLN
2.0000 g | Freq: Three times a day (TID) | INTRAVENOUS | Status: DC
  Administered 2023-06-18: 2 g via INTRAVENOUS
  Filled 2023-06-17: qty 12.5

## 2023-06-17 MED ORDER — ACETAMINOPHEN 325 MG PO TABS
650.0000 mg | ORAL_TABLET | Freq: Once | ORAL | Status: AC
  Administered 2023-06-17: 650 mg via ORAL
  Filled 2023-06-17: qty 2

## 2023-06-17 MED ORDER — LACTATED RINGERS IV BOLUS
500.0000 mL | Freq: Once | INTRAVENOUS | Status: AC
  Administered 2023-06-17: 500 mL via INTRAVENOUS

## 2023-06-17 MED ORDER — GABAPENTIN 100 MG PO CAPS
100.0000 mg | ORAL_CAPSULE | Freq: Every day | ORAL | Status: DC
  Administered 2023-06-18 – 2023-06-21 (×4): 100 mg via ORAL
  Filled 2023-06-17 (×4): qty 1

## 2023-06-17 MED ORDER — APIXABAN 5 MG PO TABS
5.0000 mg | ORAL_TABLET | Freq: Two times a day (BID) | ORAL | Status: DC
Start: 2023-06-17 — End: 2023-06-18
  Administered 2023-06-17 – 2023-06-18 (×2): 5 mg via ORAL
  Filled 2023-06-17 (×2): qty 1

## 2023-06-17 NOTE — H&P (Signed)
History and Physical    [name]:[id number] DOB: 1946/05/28 DOA: 06/17/2023  PCP: Alinda Deem, MD   Patient coming from: Home   Chief Complaint:  Chief Complaint  Patient presents with   Weakness    HPI:  Matthew Hall is a 77 y.o. male with hx of COPD, bronchiectasis, ? 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  Reports that approximately 1 month ago after discharge to SNF (recent hospitalization with stroke) developed worsening cough, productive with yellow sputum, and associated shortness of breath.  He was treated by outpatient provider with course of levofloxacin.  However cough did not improve.  He was seen at [facility] on 11/29 no imaging done that encounter improved with nebulizer treatments.  On 12/4 was seen in [place] at [place] and had to have pneumonia with worst in the right lower lobe and treated with course of doxycycline.  Returns today due to persistent cough and sputum, shortness of breath.  As well as worsening fatigue, generalized weakness, decreased appetite, and some difficulty with executive functioning per his daughter.  Reports has severe intolerance of albuterol with tachyarrhythmia requiring ICU admission in the past.  Has been using his Xopenex at home.  Denies frequent history of pneumonia, last few years ago.  Denies any known underlying lung disease other than COPD, follows with Dr. Vassie Loll of pulmonology, was to have a sputum for MAC but last sample inadequate.  Denies any dysphagia or coughing after meals, other URI symptoms, sick contacts, chest pain.  No recurrence of vasculitic rash.   Review of Systems:  ROS complete and negative except as marked above   Allergies  Allergen Reactions   Simvastatin Rash   Albuterol Palpitations   Augmentin  [Amoxicillin-Pot Clavulanate] Rash   Ciprofloxacin Rash   Contrast Media [Iodinated Contrast Media] Rash   Flagyl [Metronidazole] Rash   Latex Hives and Rash   Sulfa Antibiotics Rash    Prior to Admission medications   Medication Sig Start Date End Date Taking? Authorizing Provider  Acetaminophen (TYLENOL) 325 MG CAPS Take 1 capsule by mouth in the morning and at bedtime.    [provider]  ALPRAZolam Prudy Feeler) 0.25 MG tablet Take 0.25 mg by mouth 2 (two) times daily as needed for anxiety or sleep.  07/01/14   [provider]  apixaban (ELIQUIS) 5 MG TABS tablet Take 1 tablet (5 mg total) by mouth 2 (two) times daily. 04/17/23   Zigmund Daniel., MD  azithromycin (ZITHROMAX) 250 MG tablet Take 1 tablet (250 mg total) by mouth daily. Take first 2 tablets together, then 1 every day until finished. 06/08/23   Burgess Amor, PA-C  benzonatate (TESSALON) 100 MG capsule Take 2 capsules (200 mg total) by mouth 3 (three) times daily as needed. 06/08/23   Burgess Amor, PA-C  clotrimazole-betamethasone (LOTRISONE) cream Apply 1 Application topically 2 (two) times daily. 04/04/23   [provider]  Cyanocobalamin (VITAMIN B12 PO) Take 1 tablet by mouth daily.    [provider]  D3-50 50000 units capsule Take 50,000 Units by mouth once a week. Usually Tuesday or Wednesday. 11/23/17   [provider]  DULoxetine (CYMBALTA) 60 MG capsule TAKE 1 CAPSULE BY MOUTH EVERY DAY 06/04/23   Levert Feinstein, MD  ELDERBERRY PO Take 1 tablet by mouth daily.    [provider]  esomeprazole (NEXIUM) 40 MG capsule Take 40 mg by mouth daily at 12 noon.    [provider]  gabapentin (NEURONTIN) 100 MG capsule Take 3 capsules (300 mg total) by mouth at bedtime. 05/09/23   Levert Feinstein, MD  levalbuterol Pauline Aus) 0.63 MG/3ML nebulizer solution Take 3 mLs (0.63 mg total) by nebulization every 6 (six) hours as needed for wheezing or shortness of breath. 07/25/19   Coral Ceo, NP  metFORMIN (GLUCOPHAGE) 500 MG tablet Take 1 tablet (500 mg total) by mouth daily with breakfast. 04/18/23   Zigmund Daniel., MD  metoprolol succinate (TOPROL-XL) 25 MG 24 hr tablet TAKE 1 TABLET BY MOUTH TWICE A DAY 06/01/23   Sharlene Dory, NP  neomycin-polymyxin b-dexamethasone (MAXITROL) 3.5-10000-0.1 OINT 1 application at bedtime. 06/24/20   [provider]  predniSONE (DELTASONE) 5 MG tablet Take 10 mg by mouth 2 (two) times daily with a meal.    [provider]  triamcinolone (NASACORT) 55 MCG/ACT AERO nasal inhaler Place 1 spray into the nose daily as needed (Congestion).     [provider]  zinc gluconate 50 MG tablet Take 50 mg by mouth 2 (two) times a week.    [provider]    Past Medical History:  Diagnosis Date   Anxiety    Aortic atherosclerosis (HCC)    Atrial fibrillation (HCC)    Basal cell carcinoma    Bladder cancer [facility])    Bladder tumor    Colon polyps    COPD (chronic obstructive pulmonary disease) (HCC)    Cutaneous vasculitis    Diabetic peripheral neuropathy (HCC) 12/27/2020   Diverticulosis    Essential hypertension    GERD (gastroesophageal reflux disease)    History of pneumonia    Nephrolithiasis    Osteoarthritis     Past Surgical History:  Procedure Laterality Date   COLONOSCOPY     Kidney stones removed  1996   SKIN CANCER EXCISION Left    2003   TRANSURETHRAL RESECTION OF BLADDER TUMOR  1996     reports that he quit smoking about 11 years ago. His smoking use included cigarettes. He started smoking about 41 years ago. He has a 30 pack-year smoking history. He has never used smokeless tobacco. He reports that he does not drink alcohol and does not use drugs.  Family History  Problem Relation Age of Onset   Diabetes Mother    CAD Mother    Emphysema Father    Diabetes Sister    Stroke Sister    Lung cancer Brother    Liver cancer Brother    Colon cancer Neg Hx      Physical  Exam: Vitals:   06/17/23 1555 06/17/23 1626 06/17/23 1827 06/17/23 1930  BP: (!) 127/59  129/67 (!) 107/59  Pulse: 86  (!) 110 100  Resp: 17  (!) 30 (!) 36  Temp: 98.3 F (36.8 C)  (!) 100.4 F (38 C)   TempSrc:   Oral   SpO2: 94%  94% 93%  Weight:  68 kg    Height:  5\' 11"  (1.803 m)      Gen: Awake, alert, NAD, chronically ill-appearing CV: Tachycardic, regular, normal S1, S2, no murmurs  Resp: Normal WOB, tachypneic, on room air, diffuse Rales, mild expiratory wheezes Abd: Flat, normoactive, nontender MSK: Symmetric, no edema  Skin: No rashes or lesions to exposed skin  Neuro: Alert and interactive, oriented to person, [facility], The hospital (not to [employer]),  2024 (not to December), and situation Psych: euthymic, appropriate    Data review:   Labs reviewed, notable for:   Lactate 1.4 WBC 15  Micro:  Results for orders placed or performed during the hospital encounter of 06/08/23  Resp panel by RT-PCR (RSV, Flu A&B, Covid) Anterior Nasal Swab     Status: None   Collection Time: 06/08/23 11:58 AM   Specimen: Anterior Nasal Swab  Result Value Ref Range Status   SARS Coronavirus 2 by RT PCR NEGATIVE NEGATIVE Final    Comment: (NOTE) SARS-CoV-2 target nucleic acids are NOT DETECTED.  The SARS-CoV-2 RNA is generally detectable in upper respiratory specimens during the acute phase of infection. The lowest concentration of SARS-CoV-2 viral copies this assay can detect is 138 copies/mL. A negative result does not preclude SARS-Cov-2 infection and should not be used as the sole basis for treatment or other patient management decisions. A negative result may occur with  improper specimen collection/handling, submission of specimen other than nasopharyngeal swab, presence of viral mutation(s) within the areas targeted by this assay, and inadequate number of viral copies(<138 copies/mL). A negative result must be combined with clinical observations, patient history, and  epidemiological information. The expected result is Negative.  Fact Sheet for Patients:  BloggerCourse.com  Fact Sheet for Healthcare Providers:  SeriousBroker.it  This test is no t yet approved or cleared by the [place] FDA and  has been authorized for detection and/or diagnosis of SARS-CoV-2 by FDA under an Emergency Use Authorization (EUA). This EUA will remain  in effect (meaning this test can be used) for the duration of the COVID-19 declaration under Section 564(b)(1) of the Act, 21 U.S.C.section 360bbb-3(b)(1), unless the authorization is terminated  or revoked sooner.       Influenza A by PCR NEGATIVE NEGATIVE Final   Influenza B by PCR NEGATIVE NEGATIVE Final    Comment: (NOTE) The Xpert Xpress SARS-CoV-2/FLU/RSV plus assay is intended as an aid in the diagnosis of influenza from Nasopharyngeal swab specimens and should not be used as a sole basis for treatment. Nasal washings and aspirates are unacceptable for Xpert Xpress SARS-CoV-2/FLU/RSV testing.  Fact Sheet for Patients: BloggerCourse.com  Fact Sheet for Healthcare Providers: SeriousBroker.it  This test is not yet approved or cleared by the [place] FDA and has been authorized for detection and/or diagnosis of SARS-CoV-2 by FDA under an Emergency Use Authorization (EUA). This EUA will remain in effect (meaning this test can be used) for the duration of the COVID-19 declaration under Section 564(b)(1) of the Act, 21 U.S.C. section 360bbb-3(b)(1), unless the authorization is terminated or revoked.     Resp Syncytial Virus by PCR NEGATIVE NEGATIVE Final    Comment: (NOTE) Fact Sheet for Patients: BloggerCourse.com  Fact Sheet for Healthcare Providers: SeriousBroker.it  This test is not yet approved or cleared by the [place] FDA and has been  authorized for detection and/or diagnosis of SARS-CoV-2 by FDA under an Emergency Use Authorization (EUA). This EUA will remain in effect (meaning this test can be used) for the duration of the COVID-19 declaration under Section 564(b)(1) of the Act, 21 U.S.C. section 360bbb-3(b)(1), unless the authorization is terminated or revoked.  Performed at [facility], [address], [place], [place] [address]     Imaging reviewed:  DG Chest 2 View  Result Date: 06/17/2023 CLINICAL DATA:  Confusion, weakness, recent pneumonia EXAM: CHEST - 2 VIEW COMPARISON:  04/26/2023 FINDINGS: Multifocal patchy opacities, right lower lobe predominant, suggesting multifocal pneumonia. Small  right pleural effusion. No pneumothorax. The heart is normal in size. Visualized osseous structures are within normal limits. IMPRESSION: Multifocal pneumonia, right lower lobe predominant. Small right pleural effusion. Electronically Signed   By: Charline Bills M.D.   On: 06/17/2023 17:13    Historical data: CT chest 3/'24   CLINICAL DATA:  Bronchiectasis, former smoker, remote asbestos exposure.   EXAM: CT CHEST WITHOUT CONTRAST   TECHNIQUE: Multidetector CT imaging of the chest was performed following the standard protocol without intravenous contrast. High resolution imaging of the lungs, as well as inspiratory and expiratory imaging, was performed.   RADIATION DOSE REDUCTION: This exam was performed according to the departmental dose-optimization program which includes automated exposure control, adjustment of the mA and/or kV according to patient size and/or use of iterative reconstruction technique.   COMPARISON:  None Available.   FINDINGS: Cardiovascular: Atherosclerotic calcification of the aorta and coronary arteries. Heart is enlarged. No pericardial effusion.   Mediastinum/Nodes: No pathologically enlarged mediastinal or axillary lymph nodes. Hilar regions are difficult to definitively evaluate  without IV contrast. Esophagus is grossly unremarkable.   Lungs/Pleura: Biapical pleuroparenchymal scarring. Extensive peribronchovascular nodularity and nodular consolidation, bronchiectasis and mucoid impaction bilaterally. No pleural fluid. Airway is unremarkable. No air trapping.   Upper Abdomen: Probable cysts in the liver. Visualized portions of the liver, gallbladder, adrenal glands, kidneys, spleen, pancreas, stomach and bowel are otherwise grossly unremarkable. No upper abdominal adenopathy.   Musculoskeletal: Degenerative changes in the spine. Old rib fractures. No worrisome lytic or sclerotic lesions.   IMPRESSION: 1. No evidence of fibrotic interstitial lung disease. 2. Extensive peribronchovascular nodularity and nodular consolidation with bronchiectasis and mucoid impaction, indicative of chronic mycobacterium avium complex. 3. Aortic atherosclerosis (ICD10-I70.0). Coronary artery calcification.      ED Course:  Treated with vancomycin, cefepime, Tylenol   Assessment/Plan:  77 y.o. male with hx COPD, bronchiectasis, ? 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   Multifocal pneumonia History underlying COPD, bronchiectasis, ? MAC 1 month of persistent cough and yellow sputum, shortness of breath, worsening weakness this week.  Has been treated with levofloxacin, azithromycin, doxycycline as outpatient without improvement.  Currently on room air.  Chest x-ray with multifocal pneumonia worse in the right lower lobe.  Historical data with last CT chest in 3/' 24 included above notable for extensive peribronchovascular nodularity and nodular consolidation, bronchiectasis and mucoid impaction bilaterally; read was indicative of MAC. Was ordered for AFB sputum outpatient but sample inadequate  per his report. Not yet treated for MAC. Overall suspect CAP with risk factors for resistant organisms with hx underlying lung disease. Possible progression of MAC.  -Continue vancomycin pharmacy to dose, cefepime 2 g IV every 8 hours given underlying structural lung disease. - MRSA nares pending, check RVP, sputum AFB x 3 (standard precautions given suspect NTB MB), sputum bacterial culture, ESR, CRP - CT chest without contrast to evaluate for progressive underlying lung disease/MAC, and further evaluation of persistent pneumonia - Despite history of COPD hold off on steroids with suspected underlying pneumonia / NTB MB  -Xopenex INH as needed every 6 hours, no albuterol given his severe sensitivity - Out of bed to chair during the day, I-S, flutter valve -Swallow screen, SLP evaluation given onset of pneumonia after stroke /RLL predominance.  -Pending CT chest results, AFB results consider pulmonology consult if progressive changes  of MAC to consider initiation of treatment.  Not contacted overnight  Sepsis, present on admission, secondary to above On ED evaluation febrile 38C, WBC 15, tachypneic, tachycardic.  Source underlying pneumonia.  Lactate within normal limit 1.4. - Continue broad-spectrum antibiotics per above.  Follow-up blood cultures which are pending - Gentle fluid resuscitation 500 cc then reevaluate for additional IV fluid given normal lactate. -Hold his home antihypertensives given low normal blood pressure  Deconditioning - PT evaluation, AFO for chronic right foot drop  Chronic medical problems: Permanent A-fib: Continue home metoprolol, Eliquis Cardiomyopathy midrange EF: Not on diuretic outpatient Ischemic CVA: On DOAC, not on statin (presumed cardioembolic, and reported history of statin intolerance possible as source of vasculitis) Polyneuropathy, recent EMG NCS suggestive of right sciatic neuropathy versus lumbar radiculopathy, history right foot drop: At baseline  uses AFO for right foot drop. Hypertension: Hold home antihypertensives in setting of pneumonia, low normal blood pressures Diabetes: Hold metformin.  SSI while inpatient. GERD: sub PPI Anxiety: Home alprazolam twice daily as needed History of vasculitis: Off prednisone at time of admission History of bladder cancer: Remote TURBT. OP surveillance    Body mass index is 20.92 kg/m.    DVT prophylaxis:  Eliquis Code Status:  Full Code Diet:  Diet Orders (From admission, onward)     Start     Ordered   06/17/23 2055  Diet Heart Room service appropriate? Yes; Fluid consistency: Thin  Diet effective now       Comments: Ok for diet after swallow study  Question Answer Comment  Room service appropriate? Yes   Fluid consistency: Thin      06/17/23 2100           Family Communication: Yes discussed with his daughter at the bedside Consults: None Admission status:   Inpatient, Telemetry bed  Severity of Illness: The appropriate patient status for this patient is INPATIENT. Inpatient status is judged to be reasonable and necessary in order to provide the required intensity of service to ensure the patient's safety. The patient's presenting symptoms, physical exam findings, and initial radiographic and laboratory data in the context of their chronic comorbidities is felt to place them at high risk for further clinical deterioration. Furthermore, it is not anticipated that the patient will be medically stable for discharge from the hospital within 2 midnights of admission.   * I certify that at the point of admission it is my clinical judgment that the patient will require inpatient hospital care spanning beyond 2 midnights from the point of admission due to high intensity of service, high risk for further deterioration and high frequency of surveillance required.*   Dolly Rias, MD [facility]  How to contact the [facility] Attending or Consulting provider 7A - 7P or covering  provider during after hours 7P -7A, for this patient.  Check the care team in [facility] and look for a) attending/consulting [facility] provider listed and b) the [facility] team listed Log into [email or web address] and use [place]'s universal password to access. If you do not have the password, please contact the hospital operator. Locate the [facility] provider you are looking for under [facility] and page to a number that you can be directly reached. If you still have difficulty reaching the provider, please page the [facility] (Director on Call) for the Hospitalists listed on amion for assistance.  06/17/2023, 9:05 PM

## 2023-06-17 NOTE — ED Notes (Signed)
Pt daughter [name] would like to be notified with any new updates. Number in chart

## 2023-06-17 NOTE — ED Triage Notes (Signed)
Pt recently treated for pneumonia. Per grandaughter, pts daughter stated patient was more confused this morning than normal. Per family has had generalized weakness, decreased appetite. Pt with hx of COPD.

## 2023-06-17 NOTE — ED Provider Notes (Signed)
[place] EMERGENCY DEPARTMENT AT [facility] Provider Note   CSN: [id number] Arrival date & time: 06/17/23  1547     History  Chief Complaint  Patient presents with   Weakness    Matthew Hall is a 77 y.o. male.  Patient here with cough and shortness of breath.  He is already been on a course of Levaquin Z-Pak and doxycycline for pneumonia.  He has been on this last round of antibiotics for 5 days and continues to have fever shortness of breath and increased work of breathing.  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  Continues to have cough generalized weakness increased confusion.  Denies any chest pain abdominal pain nausea vomiting or pain with urination.  The history is provided by the patient.       Home Medications Prior to Admission medications   Medication Sig Start Date End Date Taking? Authorizing Provider  Acetaminophen (TYLENOL) 325 MG CAPS Take 1 capsule by mouth in the morning and at bedtime.    [provider]  ALPRAZolam Prudy Feeler) 0.25 MG tablet Take 0.25 mg by mouth 2 (two) times daily as needed for anxiety or sleep.  07/01/14   [provider]  apixaban (ELIQUIS) 5 MG TABS tablet Take 1 tablet (5 mg total) by mouth 2 (two) times daily. 04/17/23   Zigmund Daniel., MD  azithromycin (ZITHROMAX) 250 MG tablet Take 1 tablet (250 mg total) by mouth daily. Take first 2 tablets together, then 1 every day until finished. 06/08/23   Burgess Amor, PA-C  benzonatate (TESSALON) 100 MG capsule Take 2 capsules (200 mg total) by mouth 3 (three) times daily as needed. 06/08/23   Burgess Amor, PA-C  clotrimazole-betamethasone (LOTRISONE) cream Apply 1 Application topically 2 (two) times daily. 04/04/23   [provider]  Cyanocobalamin (VITAMIN B12 PO) Take 1 tablet by mouth daily.    [provider]  D3-50 50000 units capsule Take 50,000 Units by mouth once a week. Usually Tuesday or Wednesday. 11/23/17   [provider]   DULoxetine (CYMBALTA) 60 MG capsule TAKE 1 CAPSULE BY MOUTH EVERY DAY 06/04/23   Levert Feinstein, MD  ELDERBERRY PO Take 1 tablet by mouth daily.    [provider]  esomeprazole (NEXIUM) 40 MG capsule Take 40 mg by mouth daily at 12 noon.    [provider]  gabapentin (NEURONTIN) 100 MG capsule Take 3 capsules (300 mg total) by mouth at bedtime. 05/09/23   Levert Feinstein, MD  levalbuterol Pauline Aus) 0.63 MG/3ML nebulizer solution Take 3 mLs (0.63 mg total) by nebulization every 6 (six) hours as needed for wheezing or shortness of breath. 07/25/19   Coral Ceo, NP  metFORMIN (GLUCOPHAGE) 500 MG tablet Take 1 tablet (500 mg total) by mouth daily with breakfast. 04/18/23   Zigmund Daniel., MD  metoprolol succinate (TOPROL-XL) 25 MG 24 hr tablet TAKE 1 TABLET BY MOUTH TWICE A DAY 06/01/23   Sharlene Dory, NP  neomycin-polymyxin b-dexamethasone (MAXITROL) 3.5-10000-0.1 OINT 1 application at bedtime. 06/24/20   [provider]  predniSONE (DELTASONE) 5 MG tablet Take 10 mg by mouth 2 (two) times daily with a meal.    [provider]  triamcinolone (NASACORT) 55 MCG/ACT AERO nasal inhaler Place 1 spray into the nose daily as needed (Congestion).     [provider]  zinc gluconate 50 MG tablet Take 50 mg by mouth 2 (two) times a week.    [provider]  Allergies    Simvastatin, Albuterol, Augmentin [amoxicillin-pot clavulanate], Ciprofloxacin, Contrast media [iodinated contrast media], Flagyl [metronidazole], Latex, and Sulfa antibiotics    Review of Systems   Review of Systems  Physical Exam Updated Vital Signs BP 129/67 (BP Location: Right Arm)   Pulse (!) 110   Temp (!) 100.4 F (38 C) (Oral)   Resp (!) 30   Ht 5\' 11"  (1.803 m)   Wt 68 kg   SpO2 94%   BMI 20.92 kg/m  Physical Exam Vitals and nursing note reviewed.  Constitutional:      General: He is not in acute distress.    Appearance: He is well-developed.  HENT:      Head: Normocephalic and atraumatic.     Nose: Nose normal.     Mouth/Throat:     Mouth: Mucous membranes are moist.  Eyes:     Extraocular Movements: Extraocular movements intact.     Conjunctiva/sclera: Conjunctivae normal.     Pupils: Pupils are equal, round, and reactive to light.  Cardiovascular:     Rate and Rhythm: Normal rate and regular rhythm.     Pulses: Normal pulses.     Heart sounds: No murmur heard. Pulmonary:     Effort: No respiratory distress.     Breath sounds: Wheezing present.     Comments: Mildly increased work of breathing Abdominal:     Palpations: Abdomen is soft.     Tenderness: There is no abdominal tenderness.  Musculoskeletal:        General: No swelling.     Cervical back: Neck supple.  Skin:    General: Skin is warm and dry.     Capillary Refill: Capillary refill takes less than 2 seconds.  Neurological:     General: No focal deficit present.     Mental Status: He is alert.  Psychiatric:        Mood and Affect: Mood normal.     ED Results / Procedures / Treatments   Labs (all labs ordered are listed, but only abnormal results are displayed) Labs Reviewed  BASIC METABOLIC PANEL - Abnormal; Notable for the following components:      Result Value   Glucose, Bld 124 (*)    Calcium 8.8 (*)    All other components within normal limits  CBC - Abnormal; Notable for the following components:   WBC 15.3 (*)    Hemoglobin 12.9 (*)    All other components within normal limits  CBG MONITORING, ED - Abnormal; Notable for the following components:   Glucose-Capillary 129 (*)    All other components within normal limits  CULTURE, BLOOD (ROUTINE X 2)  CULTURE, BLOOD (ROUTINE X 2)  MRSA NEXT GEN BY PCR, NASAL  URINALYSIS, ROUTINE W REFLEX MICROSCOPIC  HEPATIC FUNCTION PANEL  I-STAT CG4 LACTIC ACID, ED    EKG EKG Interpretation Date/Time:  Sunday June 17 2023 16:44:23 EST Ventricular Rate:  113 PR Interval:    QRS Duration:  116 QT  Interval:  306 QTC Calculation: 419 R Axis:   243  Text Interpretation: Atrial fibrillation with rapid ventricular response with premature ventricular or aberrantly conducted complexes Anterolateral infarct , age undetermined Abnormal ECG When compared with ECG of 12-Apr-2023 09:28, PREVIOUS ECG IS PRESENT Confirmed by Virgina Norfolk [phone]) on 06/17/2023 6:16:30 PM  Radiology DG Chest 2 View  Result Date: 06/17/2023 CLINICAL DATA:  Confusion, weakness, recent pneumonia EXAM: CHEST - 2 VIEW COMPARISON:  04/26/2023 FINDINGS: Multifocal patchy opacities, right lower lobe predominant,  suggesting multifocal pneumonia. Small right pleural effusion. No pneumothorax. The heart is normal in size. Visualized osseous structures are within normal limits. IMPRESSION: Multifocal pneumonia, right lower lobe predominant. Small right pleural effusion. Electronically Signed   By: Charline Bills M.D.   On: 06/17/2023 17:13    Procedures .Critical Care  Performed by: Virgina Norfolk, DO Authorized by: Virgina Norfolk, DO   Critical care provider statement:    Critical care time (minutes):  35   Critical care was necessary to treat or prevent imminent or life-threatening deterioration of the following conditions:  Sepsis   Critical care was time spent personally by me on the following activities:  Blood draw for specimens, development of treatment plan with patient or surrogate, discussions with primary provider, evaluation of patient's response to treatment, examination of patient, obtaining history from patient or surrogate, ordering and performing treatments and interventions, ordering and review of laboratory studies, ordering and review of radiographic studies, pulse oximetry, re-evaluation of patient's condition and review of old charts   I assumed direction of critical care for this patient from another provider in my specialty: no       Medications Ordered in ED Medications  ceFEPIme (MAXIPIME) 2 g in  sodium chloride 0.9 % 100 mL IVPB (2 g Intravenous New Bag/Given 06/17/23 1916)  vancomycin (VANCOREADY) IVPB 1250 mg/250 mL (1,250 mg Intravenous New Bag/Given 06/17/23 1918)    ED Course/ Medical Decision Making/ A&P                                 Medical Decision Making Amount and/or Complexity of Data Reviewed Labs: ordered. Radiology: ordered.  Risk OTC drugs. Prescription drug management. Decision regarding hospitalization.   JAXIEL KOTALIK is here for cough and fever and confusion.  Patient initially not febrile but on my evaluation febrile tachycardic and tachypneic.  Code sepsis initiated.  He has been on multiple rounds of antibiotics for pneumonia.  He has been on Levaquin and then Z-Pak and now on doxycycline for the last few days.  Continues with respiratory symptoms and now some confusion.  She had a history of COPD, A-fib on Eliquis.  Chest x-ray has ready been done prior to my evaluation neck shows ongoing pneumonia.  He has a white count of 15 per my review and interpretation of labs thus far.  I will now add lactic acid blood cultures and complete sepsis workup.  He is hemodynamically stable will hold IV fluids at this time.  Will start IV vancomycin and cefepime and admit to medicine for further sepsis workup and pneumonia care.  Lactic acid is normal.  Lab work otherwise unremarkable.  Will admit to medicine for further care.  He is on room air.  This chart was dictated using voice recognition software.  Despite best efforts to proofread,  errors can occur which can change the documentation meaning.         Final Clinical Impression(s) / ED Diagnoses Final diagnoses:  Pneumonia due to infectious organism, unspecified laterality, unspecified part of lung  Sepsis, due to unspecified organism, unspecified whether acute organ dysfunction present [facility])    Rx / DC Orders ED Discharge Orders     None         Virgina Norfolk, DO 06/17/23 1934

## 2023-06-17 NOTE — Progress Notes (Signed)
ED Pharmacy Antibiotic Sign Off An antibiotic consult was received from an ED provider for cefepime and vancomycin per pharmacy dosing for pneumonia. A chart review was completed to assess appropriateness.  A single dose of cefepime and vancomycin placed by the ED provider.   The following one time order(s) were placed per pharmacy consult:  MRSA PCR  Further antibiotic and/or antibiotic pharmacy consults should be ordered by the admitting provider if indicated.   Thank you for allowing pharmacy to be a part of this patient's care.   Delmar Landau, PharmD, BCPS 06/17/2023 6:53 PM ED Clinical Pharmacist -  [phone]

## 2023-06-17 NOTE — Sepsis Progress Note (Signed)
[facility] following for sepsis protocol. 

## 2023-06-17 NOTE — ED Provider Triage Note (Signed)
Emergency Medicine Provider Triage Evaluation Note  Matthew Hall , a 77 y.o. male  was evaluated in triage.  Per daughter: Decreased appetite, generalized weakness for a week and a half. Confusion this morning - talking in circles according to family members Slurred speech this morning before 0900 noted by family friend.  Patient complains of generalized weakness. "three infected teeth for past couple years"  Been taking doxy for PNA diagnosed   Review of Systems  Positive: Decreased appetite, generalized weakness, Confusion  Negative: Fevers, falls  Physical Exam  BP (!) 127/59   Pulse 86   Temp 98.3 F (36.8 C)   Resp 17   Ht 5\' 11"  (1.803 m)   Wt 68 kg   SpO2 94%   BMI 20.92 kg/m  Gen:   Awake, no distress   Resp:  Normal effort  MSK:   Moves extremities without difficulty  Other:    Medical Decision Making  Medically screening exam initiated at 4:43 PM.  Appropriate orders placed.  Matthew Hall was informed that the remainder of the evaluation will be completed by another provider, this initial triage assessment does not replace that evaluation, and the importance of remaining in the ED until their evaluation is complete.  Labs ordered   Judithann Sheen, [place] 06/17/23 1651

## 2023-06-17 NOTE — Progress Notes (Signed)
Pharmacy Antibiotic Note  Matthew Hall is a 77 y.o. male for which pharmacy has been consulted for vancomycin dosing for pneumonia.  Patient with a history of COPD, bronchiectasis, ?MAC, AF, cardiomyopathy, CVA, vasculitis, bladder cancer, HTN, DM.  SCr 0.95 WBC 15.3; LA 1.4; T 100.4; HR 100; RR 36  Plan: Cefepime 2g q8hr --- borderline for dose adjustment based on CrCl Vancomycin 1250 mg once then 750 mg q12hr (eAUC 509.5) unless change in renal function Monitor WBC, fever, renal function, cultures De-escalate when able Levels at steady state F/u MRSA PCR  Height: 5\' 11"  (180.3 cm) Weight: 68 kg (150 lb) IBW/kg (Calculated) : 75.3  Temp (24hrs), Avg:99.4 F (37.4 C), Min:98.3 F (36.8 C), Max:100.4 F (38 C)  Recent Labs  Lab 06/17/23 1627 06/17/23 1903  WBC 15.3*  --   CREATININE 0.95  --   LATICACIDVEN  --  1.4    Estimated Creatinine Clearance: 60.6 mL/min (by C-G formula based on SCr of 0.95 mg/dL).    Allergies  Allergen Reactions   Simvastatin Rash   Albuterol Palpitations   Augmentin [Amoxicillin-Pot Clavulanate] Rash   Ciprofloxacin Rash   Contrast Media [Iodinated Contrast Media] Rash   Flagyl [Metronidazole] Rash   Latex Hives and Rash   Sulfa Antibiotics Rash    Microbiology results: Pending  Thank you for allowing pharmacy to be a part of this patient's care.  Delmar Landau, PharmD, BCPS 06/17/2023 9:21 PM ED Clinical Pharmacist -  [phone]

## 2023-06-18 DIAGNOSIS — J449 Chronic obstructive pulmonary disease, unspecified: Secondary | ICD-10-CM

## 2023-06-18 DIAGNOSIS — J479 Bronchiectasis, uncomplicated: Secondary | ICD-10-CM

## 2023-06-18 DIAGNOSIS — J189 Pneumonia, unspecified organism: Secondary | ICD-10-CM | POA: Diagnosis not present

## 2023-06-18 DIAGNOSIS — J441 Chronic obstructive pulmonary disease with (acute) exacerbation: Secondary | ICD-10-CM | POA: Diagnosis present

## 2023-06-18 LAB — BASIC METABOLIC PANEL WITH GFR
Anion gap: 9 (ref 5–15)
BUN: 12 mg/dL (ref 8–23)
CO2: 26 mmol/L (ref 22–32)
Calcium: 8.6 mg/dL — ABNORMAL LOW (ref 8.9–10.3)
Chloride: 103 mmol/L (ref 98–111)
Creatinine, Ser: 1.03 mg/dL (ref 0.61–1.24)
GFR, Estimated: >60 mL/min
Glucose, Bld: 105 mg/dL — ABNORMAL HIGH (ref 70–99)
Potassium: 3.3 mmol/L — ABNORMAL LOW (ref 3.5–5.1)
Sodium: 138 mmol/L (ref 135–145)

## 2023-06-18 LAB — CBC WITH DIFFERENTIAL/PLATELET
Abs Immature Granulocytes: 0.31 K/uL — ABNORMAL HIGH (ref 0.00–0.07)
Basophils Absolute: 0.1 K/uL (ref 0.0–0.1)
Basophils Relative: 1 %
Eosinophils Absolute: 0.1 K/uL (ref 0.0–0.5)
Eosinophils Relative: 1 %
HCT: 39.1 % (ref 39.0–52.0)
Hemoglobin: 12.3 g/dL — ABNORMAL LOW (ref 13.0–17.0)
Immature Granulocytes: 2 %
Lymphocytes Relative: 11 %
Lymphs Abs: 1.4 K/uL (ref 0.7–4.0)
MCH: 30.6 pg (ref 26.0–34.0)
MCHC: 31.5 g/dL (ref 30.0–36.0)
MCV: 97.3 fL (ref 80.0–100.0)
Monocytes Absolute: 1.4 K/uL — ABNORMAL HIGH (ref 0.1–1.0)
Monocytes Relative: 11 %
Neutro Abs: 9.5 K/uL — ABNORMAL HIGH (ref 1.7–7.7)
Neutrophils Relative %: 74 %
Platelets: 301 K/uL (ref 150–400)
RBC: 4.02 MIL/uL — ABNORMAL LOW (ref 4.22–5.81)
RDW: 15 % (ref 11.5–15.5)
WBC: 12.7 K/uL — ABNORMAL HIGH (ref 4.0–10.5)
nRBC: 0 % (ref 0.0–0.2)

## 2023-06-18 LAB — RESPIRATORY PANEL BY PCR

## 2023-06-18 LAB — URINALYSIS, ROUTINE W REFLEX MICROSCOPIC
Bilirubin Urine: NEGATIVE
Glucose, UA: NEGATIVE mg/dL
Ketones, ur: NEGATIVE mg/dL
Leukocytes,Ua: NEGATIVE
Nitrite: NEGATIVE
Protein, ur: 30 mg/dL — AB
Specific Gravity, Urine: 1.029 (ref 1.005–1.030)
pH: 5 (ref 5.0–8.0)

## 2023-06-18 LAB — GLUCOSE, CAPILLARY
Glucose-Capillary: 144 mg/dL — ABNORMAL HIGH (ref 70–99)
Glucose-Capillary: 114 mg/dL — ABNORMAL HIGH (ref 70–99)
Glucose-Capillary: 142 mg/dL — ABNORMAL HIGH (ref 70–99)

## 2023-06-18 LAB — MAGNESIUM: Magnesium: 1.7 mg/dL (ref 1.7–2.4)

## 2023-06-18 LAB — C-REACTIVE PROTEIN: CRP: 18.1 mg/dL — ABNORMAL HIGH

## 2023-06-18 LAB — CBG MONITORING, ED: Glucose-Capillary: 82 mg/dL (ref 70–99)

## 2023-06-18 LAB — SEDIMENTATION RATE: Sed Rate: 71 mm/h — ABNORMAL HIGH (ref 0–16)

## 2023-06-18 LAB — PHOSPHORUS: Phosphorus: 3.1 mg/dL (ref 2.5–4.6)

## 2023-06-18 MED ORDER — SODIUM CHLORIDE 0.9 % IV SOLN
2.0000 g | Freq: Two times a day (BID) | INTRAVENOUS | Status: DC
  Administered 2023-06-18 – 2023-06-21 (×5): 2 g via INTRAVENOUS
  Filled 2023-06-18 (×5): qty 12.5

## 2023-06-18 MED ORDER — POTASSIUM CHLORIDE CRYS ER 20 MEQ PO TBCR
20.0000 meq | EXTENDED_RELEASE_TABLET | Freq: Two times a day (BID) | ORAL | Status: AC
  Administered 2023-06-18 – 2023-06-19 (×4): 20 meq via ORAL
  Filled 2023-06-18 (×4): qty 1

## 2023-06-18 MED ORDER — BUDESONIDE 0.25 MG/2ML IN SUSP
0.2500 mg | Freq: Two times a day (BID) | RESPIRATORY_TRACT | Status: DC
  Administered 2023-06-18 – 2023-06-22 (×7): 0.25 mg via RESPIRATORY_TRACT
  Filled 2023-06-18 (×8): qty 2

## 2023-06-18 MED ORDER — MAGNESIUM OXIDE -MG SUPPLEMENT 400 (240 MG) MG PO TABS
400.0000 mg | ORAL_TABLET | Freq: Two times a day (BID) | ORAL | Status: DC
  Administered 2023-06-18 – 2023-06-22 (×9): 400 mg via ORAL
  Filled 2023-06-18 (×10): qty 1

## 2023-06-18 MED ORDER — ACETAMINOPHEN 500 MG PO TABS
1000.0000 mg | ORAL_TABLET | Freq: Four times a day (QID) | ORAL | Status: DC | PRN
  Administered 2023-06-18 – 2023-06-21 (×2): 1000 mg via ORAL
  Filled 2023-06-18 (×2): qty 2

## 2023-06-18 MED ORDER — IPRATROPIUM BROMIDE 0.02 % IN SOLN
0.5000 mg | Freq: Four times a day (QID) | RESPIRATORY_TRACT | Status: DC | PRN
  Administered 2023-06-18: 0.5 mg via RESPIRATORY_TRACT
  Filled 2023-06-18 (×2): qty 2.5

## 2023-06-18 NOTE — H&P (View-Only) (Signed)
NAME:  Matthew Hall, MRN:  [id number], DOB:  1945/08/17, LOS: 1 ADMISSION DATE:  06/17/2023 CONSULTATION DATE:  06/18/2023 REFERRING MD:  Jerral Ralph - [facility] CHIEF COMPLAINT:  Cough, bronchiectasis, ?MAC   History of Present Illness:  77 year old man who presented to [facility] 12/8 for increasing weakness and SOB with cough x 1 month. PMHx significant for HTN, HLD, Afib (on Eliquis), ischemic CVA, lymphocytic vasculitis, COPD with bronchiectasis (follows with [facility] - Dr. Vassie Loll), T2DM, GERD, diverticulosis, bladder CA, anxiety. Recently admitted 10/3 - 10/9 for ischemic CVA and discharged to SNF.  After discharge, [name] developed worsening productive cough and SOB; he was treated in the outpatient setting with a course of Levaquin, unfortunately without improvement. Patient was then seen at [facility] 11/29 for SOB and had improvement with nebulizer treatments. Patient was seen again for similar symptoms on 12/4 ([place]) with workup demonstrating RLL PNA (treated with doxycycline). Symptoms remained persistent with worsening weakness and patient presented to [facility] for further care. Broad-spectrum cefepime/vanc initiated.  He has been worked up for bronchiectasis with tree-in-bud opacities as an outpatient.  Sputum AFB was attempted but could not be obtained.  He reports failure to thrive, 20 pound weight loss over the past year.  PCCM consulted for further recommendations.  Pertinent Medical History:   Past Medical History:  Diagnosis Date   Anxiety    Aortic atherosclerosis (HCC)    Atrial fibrillation (HCC)    Basal cell carcinoma    Bladder cancer [facility])    Bladder tumor    Colon polyps    COPD (chronic obstructive pulmonary disease) (HCC)    Cutaneous vasculitis    Diabetic peripheral neuropathy (HCC) 12/27/2020   Diverticulosis    Essential hypertension    GERD (gastroesophageal reflux disease)    History of pneumonia    Nephrolithiasis    Osteoarthritis    Significant  Hospital Events: Including procedures, antibiotic start and stop dates in addition to other pertinent events   12/8 - Admitted to [facility] under [facility] service. 12/9 - Pulmonary consult  Interim History / Subjective:  PCCM consulted for recommendations re: SOB in the setting of bronchiectasis, ?MAC.  Objective:  Blood pressure 138/73, pulse 87, temperature 99 F (37.2 C), temperature source Oral, resp. rate 18, height 5\' 11"  (1.803 m), weight 68 kg, SpO2 98%.        Intake/Output Summary (Last 24 hours) at 06/18/2023 1340 Last data filed at 06/18/2023 1253 Gross per 24 hour  Intake 580 ml  Output 200 ml  Net 380 ml   Filed Weights   06/17/23 1626  Weight: 68 kg   Physical Examination: General: Chronically ill-appearing man in NAD. HEENT: [place]/AT, anicteric sclera, PERRL, moist mucous membranes. Neuro: Awake, oriented x 3.    CV: RRR, no m/g/r. PULM: Breathing even and unlabored. Lung fields clear. GI: Soft, nontender, nondistended. Normoactive bowel sounds. Extremities: No LE edema noted. Skin: Warm/dry.    Resolved Hospital Problem List:    Assessment & Plan:  [name] is seen in consultation at the request of Dr. Jerral Ralph [facility]) for recommendations on further evaluation and management of SOB, possible MAC.  77 year old man who presented to [facility] 12/8 for increasing weakness and SOB with cough x 1 month. PMHx significant for HTN, HLD, Afib (on Eliquis), ischemic CVA, COPD with bronchiectasis, T2DM, GERD, diverticulosis, bladder CA, anxiety.  Follows with Dr. Vassie Loll in the office; bronchiectasis has been present since at least 2009 (not seen on CT 1995). FEV1 59% in 2009,  worse in 2024.  COPD with bronchiectasis ?MAC CT Chest 12/8 demonstrated bronchiectasis with mucoid impaction and extensive peribronchovascular nodularity, similar to prior study, ?chronic MAC.  Notes 20 pound weight loss with failure to thrive and recurrent pneumonia  - Supplemental O2 support - Wean O2 for sat  > 88-92% - Pulmonary hygiene - Will plan for bronchoscopy with BAL 12/10, NPO at midnight  Best Practice: (right click and "Reselect all SmartList Selections" daily)   Per Primary Team   Signature:   Chilton Greathouse MD [facility] See Amion for pager  If no response to pager , please call [phone] until 7pm After 7:00 pm call Elink  [phone] 06/18/2023, 3:06 PM

## 2023-06-18 NOTE — ED Notes (Signed)
ED TO INPATIENT HANDOFF REPORT  ED Nurse Name and Phone #: [phone]  S Name/Age/Gender Matthew Hall 77 y.o. male Room/Bed: 001C/001C  Code Status   Code Status: Full Code  Home/SNF/Other Home Patient oriented to: self, place, time, and situation Is this baseline? Yes   Triage Complete: Triage complete  Chief Complaint Multifocal pneumonia [J18.9] COPD with acute exacerbation (HCC) [J44.1]  Triage Note Pt recently treated for pneumonia. Per grandaughter, pts daughter stated patient was more confused this morning than normal. Per family has had generalized weakness, decreased appetite. Pt with hx of COPD.    Allergies Allergies  Allergen Reactions   Simvastatin Rash   Cortisone Nausea And Vomiting   Albuterol Palpitations   Augmentin [Amoxicillin-Pot Clavulanate] Rash   Ciprofloxacin Rash   Contrast Media [Iodinated Contrast Media] Rash   Flagyl [Metronidazole] Rash   Latex Hives and Rash   Sulfa Antibiotics Rash    Level of Care/Admitting Diagnosis ED Disposition     ED Disposition  Admit   Condition  --   Comment  Hospital Area: [facility] [[id number]]  Level of Care: Med-Surg [16]  May admit patient to [facility] or [facility] if equivalent level of care is available:: Yes  Covid Evaluation: Asymptomatic - no recent exposure (last 10 days) testing not required  Diagnosis: COPD with acute exacerbation [facility]) [[id number]]  Admitting Physician: Dorcas Carrow [[id number]]  Attending Physician: Dorcas Carrow [[id number]]  Certification:: I certify this patient will need inpatient services for at least 2 midnights          B Medical/Surgery History Past Medical History:  Diagnosis Date   Anxiety    Aortic atherosclerosis (HCC)    Atrial fibrillation (HCC)    Basal cell carcinoma    Bladder cancer (HCC)    Bladder tumor    Colon polyps    COPD (chronic obstructive pulmonary disease) (HCC)    Cutaneous vasculitis    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   Diverticulosis    Essential hypertension    GERD (gastroesophageal reflux disease)    History of pneumonia    Nephrolithiasis    Osteoarthritis    Past Surgical History:  Procedure Laterality Date   COLONOSCOPY     Kidney stones removed  1996   SKIN CANCER EXCISION Left    2003   TRANSURETHRAL RESECTION OF BLADDER TUMOR  1996     A IV Location/Drains/Wounds Patient Lines/Drains/Airways Status     Active Line/Drains/Airways     Name Placement date Placement time Site Days   Peripheral IV 06/17/23 20 G Distal;Posterior;Right Forearm 06/17/23  1850  Forearm  1   Peripheral IV 06/17/23 20 G Left;Posterior Forearm 06/17/23  1856  Forearm  1   Wound / Incision (Open or Dehisced) 10/29/20  Toe (Comment  which one) Anterior;Left bandaged 10/29/20  1904  Toe (Comment  which one)  962            Intake/Output Last 24 hours  Intake/Output Summary (Last 24 hours) at 06/18/2023 0930 Last data filed at 06/18/2023 0704 Gross per 24 hour  Intake 100 ml  Output --  Net 100 ml    Labs/Imaging Results for orders placed or performed during the hospital encounter of 06/17/23 (from the past 48 hour(s))  Basic metabolic panel     Status: Abnormal   Collection Time: 06/17/23  4:27 PM  Result Value Ref Range   Sodium 136 135 - 145 mmol/L   Potassium 3.6 3.5 -  5.1 mmol/L   Chloride 101 98 - 111 mmol/L   CO2 24 22 - 32 mmol/L   Glucose, Bld 124 (H) 70 - 99 mg/dL    Comment: Glucose reference range applies only to samples taken after fasting for at least 8 hours.   BUN 13 8 - 23 mg/dL   Creatinine, Ser 6.29 0.61 - 1.24 mg/dL   Calcium 8.8 (L) 8.9 - 10.3 mg/dL   GFR, Estimated >52 >84 mL/min    Comment: (NOTE) Calculated using the CKD-EPI Creatinine Equation (2021)    Anion gap 11 5 - 15    Comment: Performed at [facility], [address], [place], [place] [address]  CBC     Status: Abnormal   Collection Time: 06/17/23  4:27 PM   Result Value Ref Range   WBC 15.3 (H) 4.0 - 10.5 K/uL   RBC 4.30 4.22 - 5.81 MIL/uL   Hemoglobin 12.9 (L) 13.0 - 17.0 g/dL   HCT 01.0 27.2 - 53.6 %   MCV 96.7 80.0 - 100.0 fL   MCH 30.0 26.0 - 34.0 pg   MCHC 31.0 30.0 - 36.0 g/dL   RDW 64.4 03.4 - 74.2 %   Platelets 323 150 - 400 K/uL   nRBC 0.0 0.0 - 0.2 %    Comment: Performed at [facility], [address] [place], [place] [address]  CBG monitoring, ED     Status: Abnormal   Collection Time: 06/17/23  4:34 PM  Result Value Ref Range   Glucose-Capillary 129 (H) 70 - 99 mg/dL    Comment: Glucose reference range applies only to samples taken after fasting for at least 8 hours.   Comment 1 Notify RN    Comment 2 Document in Chart   Blood culture (routine x 2)     Status: None (Preliminary result)   Collection Time: 06/17/23  6:47 PM   Specimen: BLOOD  Result Value Ref Range   Specimen Description BLOOD BLOOD LEFT HAND    Special Requests      BOTTLES DRAWN AEROBIC AND ANAEROBIC Blood Culture results may not be optimal due to an inadequate volume of blood received in culture bottles   Culture      NO GROWTH < 24 HOURS Performed at [facility], [address], [place], [place] [address]    Report Status PENDING   Hepatic function panel     Status: Abnormal   Collection Time: 06/17/23  6:47 PM  Result Value Ref Range   Total Protein 6.5 6.5 - 8.1 g/dL   Albumin 2.4 (L) 3.5 - 5.0 g/dL   AST 13 (L) 15 - 41 U/L   ALT 14 0 - 44 U/L   Alkaline Phosphatase 43 38 - 126 U/L   Total Bilirubin 1.2 (H) <1.2 mg/dL   Bilirubin, Direct 0.3 (H) 0.0 - 0.2 mg/dL   Indirect Bilirubin 0.9 0.3 - 0.9 mg/dL    Comment: Performed at [facility], [address] [place], [place] [address]  Blood culture (routine x 2)     Status: None (Preliminary result)   Collection Time: 06/17/23  6:58 PM   Specimen: BLOOD  Result Value Ref Range   Specimen Description BLOOD RIGHT ANTECUBITAL    Special Requests      BOTTLES DRAWN  AEROBIC AND ANAEROBIC Blood Culture results may not be optimal due to an inadequate volume of blood received in culture bottles   Culture  NO GROWTH < 24 HOURS Performed at [facility], [address], [place], [place] [address]    Report Status PENDING   I-Stat CG4 Lactic Acid     Status: None   Collection Time: 06/17/23  7:03 PM  Result Value Ref Range   Lactic Acid, Venous 1.4 0.5 - 1.9 mmol/L  MRSA Next Gen by PCR, Nasal     Status: Abnormal   Collection Time: 06/17/23  7:51 PM   Specimen: Nasal Mucosa; Nasal Swab  Result Value Ref Range   MRSA by PCR Next Gen DETECTED (A) NOT DETECTED    Comment: RESULT CALLED TO, READ BACK BY AND VERIFIED WITH: C COBB,RN@2202  06/17/23 MK (NOTE) The GeneXpert MRSA Assay (FDA approved for NASAL specimens only), is one component of a comprehensive MRSA colonization surveillance program. It is not intended to diagnose MRSA infection nor to guide or monitor treatment for MRSA infections. Test performance is not FDA approved in patients less than 95 years old. Performed at [facility], [address], [place], [place] [address]   Respiratory (~20 pathogens) panel by PCR     Status: None   Collection Time: 06/17/23  9:58 PM   Specimen: Sputum; Respiratory  Result Value Ref Range   Adenovirus NOT DETECTED NOT DETECTED   Coronavirus 229E NOT DETECTED NOT DETECTED    Comment: (NOTE) The Coronavirus on the Respiratory Panel, DOES NOT test for the novel  Coronavirus (2019 nCoV)    Coronavirus HKU1 NOT DETECTED NOT DETECTED   Coronavirus NL63 NOT DETECTED NOT DETECTED   Coronavirus OC43 NOT DETECTED NOT DETECTED   Metapneumovirus NOT DETECTED NOT DETECTED   Rhinovirus / Enterovirus NOT DETECTED NOT DETECTED   Influenza A NOT DETECTED NOT DETECTED   Influenza B NOT DETECTED NOT DETECTED   Parainfluenza Virus 1 NOT DETECTED NOT DETECTED   Parainfluenza Virus 2 NOT DETECTED NOT DETECTED   Parainfluenza Virus 3 NOT DETECTED NOT  DETECTED   Parainfluenza Virus 4 NOT DETECTED NOT DETECTED   Respiratory Syncytial Virus NOT DETECTED NOT DETECTED   Bordetella pertussis NOT DETECTED NOT DETECTED   Bordetella Parapertussis NOT DETECTED NOT DETECTED   Chlamydophila pneumoniae NOT DETECTED NOT DETECTED   Mycoplasma pneumoniae NOT DETECTED NOT DETECTED    Comment: Performed at [facility], [address], [place], [place] [address]  Urinalysis, Routine w reflex microscopic -Urine, Clean Catch     Status: Abnormal   Collection Time: 06/18/23  2:04 AM  Result Value Ref Range   Color, Urine AMBER (A) YELLOW    Comment: BIOCHEMICALS MAY BE AFFECTED BY COLOR   APPearance CLEAR CLEAR   Specific Gravity, Urine 1.029 1.005 - 1.030   pH 5.0 5.0 - 8.0   Glucose, UA NEGATIVE NEGATIVE mg/dL   Hgb urine dipstick SMALL (A) NEGATIVE   Bilirubin Urine NEGATIVE NEGATIVE   Ketones, ur NEGATIVE NEGATIVE mg/dL   Protein, ur 30 (A) NEGATIVE mg/dL   Nitrite NEGATIVE NEGATIVE   Leukocytes,Ua NEGATIVE NEGATIVE   RBC / HPF 0-5 0 - 5 RBC/hpf   WBC, UA 0-5 0 - 5 WBC/hpf   Bacteria, UA RARE (A) NONE SEEN   Squamous Epithelial / HPF 0-5 0 - 5 /HPF   Mucus PRESENT    Ca Oxalate Crys, UA PRESENT     Comment: Performed at [facility], [address], [place], [place] [address]  Basic metabolic panel     Status: Abnormal   Collection Time: 06/18/23  2:49 AM  Result Value Ref Range   Sodium 138 135 - 145 mmol/L   Potassium 3.3 (L) 3.5 - 5.1 mmol/L   Chloride 103 98 - 111 mmol/L   CO2 26 22 - 32 mmol/L   Glucose, Bld 105 (H) 70 - 99 mg/dL    Comment: Glucose reference range applies only to samples taken after fasting for at least 8 hours.   BUN 12 8 - 23 mg/dL   Creatinine, Ser 0.27 0.61 - 1.24 mg/dL   Calcium 8.6 (L) 8.9 - 10.3 mg/dL   GFR, Estimated >25 >36 mL/min    Comment: (NOTE) Calculated using the CKD-EPI Creatinine Equation (2021)    Anion gap 9 5 - 15    Comment: Performed at [facility], [address] [place], [place] [address]  CBC with Differential/Platelet     Status: Abnormal   Collection Time: 06/18/23  2:49 AM  Result Value Ref Range   WBC 12.7 (H) 4.0 - 10.5 K/uL   RBC 4.02 (L) 4.22 - 5.81 MIL/uL   Hemoglobin 12.3 (L) 13.0 - 17.0 g/dL   HCT 47.4 25.9 - 56.3 %   MCV 97.3 80.0 - 100.0 fL   MCH 30.6 26.0 - 34.0 pg   MCHC 31.5 30.0 - 36.0 g/dL   RDW 87.5 64.3 - 32.9 %   Platelets 301 150 - 400 K/uL   nRBC 0.0 0.0 - 0.2 %   Neutrophils Relative % 74 %   Neutro Abs 9.5 (H) 1.7 - 7.7 K/uL   Lymphocytes Relative 11 %   Lymphs Abs 1.4 0.7 - 4.0 K/uL   Monocytes Relative 11 %   Monocytes Absolute 1.4 (H) 0.1 - 1.0 K/uL   Eosinophils Relative 1 %   Eosinophils Absolute 0.1 0.0 - 0.5 K/uL   Basophils Relative 1 %   Basophils Absolute 0.1 0.0 - 0.1 K/uL   Immature Granulocytes 2 %   Abs Immature Granulocytes 0.31 (H) 0.00 - 0.07 K/uL    Comment: Performed at [facility], [address] [place], [place] [address]  Magnesium     Status: None   Collection Time: 06/18/23  2:49 AM  Result Value Ref Range   Magnesium 1.7 1.7 - 2.4 mg/dL    Comment: Performed at [facility], [address], [place], [place] [address]  Phosphorus     Status: None   Collection Time: 06/18/23  2:49 AM  Result Value Ref Range   Phosphorus 3.1 2.5 - 4.6 mg/dL    Comment: Performed at [facility], [address] [place], [place] [address]  Sedimentation rate     Status: Abnormal   Collection Time: 06/18/23  2:49 AM  Result Value Ref Range   Sed Rate 71 (H) 0 - 16 mm/hr    Comment: Performed at [facility], [address], [place], [place] [address]  C-reactive protein     Status: Abnormal   Collection Time: 06/18/23  2:49 AM  Result Value Ref Range   CRP 18.1 (H) <1.0 mg/dL    Comment: Performed at [facility], [address], [place], [place] [address]  CBG monitoring, ED     Status: None   Collection Time: 06/18/23  8:00 AM  Result Value Ref Range    Glucose-Capillary 82 70 - 99 mg/dL    Comment: Glucose reference range applies only to samples taken after fasting for at least 8 hours.   CT  CHEST WO CONTRAST  Result Date: 06/17/2023 CLINICAL DATA:  Cough. Failed inferior treatment for multifocal pneumonia. 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 EXAM: CT CHEST WITHOUT CONTRAST TECHNIQUE: Multidetector CT imaging of the chest was performed following the standard protocol without IV contrast. RADIATION DOSE REDUCTION: This exam was performed according to the departmental dose-optimization program which includes automated exposure control, adjustment of the mA and/or kV according to patient size and/or use of iterative reconstruction technique. COMPARISON:  09/18/2022 FINDINGS: Cardiovascular: Heart is mildly enlarged. Scattered coronary artery and aortic atherosclerosis. Mediastinum/Nodes: No mediastinal, hilar, or axillary adenopathy. Trachea and esophagus are unremarkable. Thyroid unremarkable. Lungs/Pleura: Biapical scarring. Peribronchovascular nodularity again noted throughout the lungs, most pronounced in the lower lobes where there is also bronchiectasis and mucous plugging, right greater than left. Findings are stable since prior study. No acute areas of consolidation. Trace right pleural effusion, new since prior study. Upper Abdomen: No acute findings Musculoskeletal: Chest wall soft tissues are unremarkable. No acute bony abnormality. IMPRESSION: Bronchiectasis with mucoid impaction and extensive peribronchovascular nodularity again noted in similar to prior study. Compatible with chronic MAC. Trace right pleural effusion. Coronary artery disease. Aortic Atherosclerosis (ICD10-I70.0). Electronically Signed   By: Charlett Nose M.D.   On: 06/17/2023 21:32   DG Chest 2 View  Result Date: 06/17/2023 CLINICAL DATA:  Confusion, weakness, recent pneumonia EXAM: CHEST - 2 VIEW COMPARISON:  04/26/2023 FINDINGS: Multifocal patchy opacities, right lower  lobe predominant, suggesting multifocal pneumonia. Small right pleural effusion. No pneumothorax. The heart is normal in size. Visualized osseous structures are within normal limits. IMPRESSION: Multifocal pneumonia, right lower lobe predominant. Small right pleural effusion. Electronically Signed   By: Charline Bills M.D.   On: 06/17/2023 17:13    Pending Labs Unresulted Labs (From admission, onward)     Start     Ordered   06/17/23 2058  Expectorated Sputum Assessment w Gram Stain, Rflx to Resp Cult  Once,   R        06/17/23 2100   06/17/23 2057  Acid Fast Smear (AFB)  (AFB smear + Culture w reflexed sensitivities panel)  Once,   R       Placed in "And" Linked Group   06/17/23 2100   06/17/23 2057  Acid Fast Culture with reflexed sensitivities  (AFB smear + Culture w reflexed sensitivities panel)  Once,   R       Placed in "And" Linked Group   06/17/23 2100   06/17/23 2057  Acid Fast Smear (AFB)  (AFB smear + Culture w reflexed sensitivities panel)  Once,   R       Placed in "And" Linked Group   06/17/23 2100   06/17/23 2057  Acid Fast Culture with reflexed sensitivities  (AFB smear + Culture w reflexed sensitivities panel)  Once,   R       Placed in "And" Linked Group   06/17/23 2100   06/17/23 2056  Acid Fast Smear (AFB)  (AFB smear + Culture w reflexed sensitivities panel)  Once,   R       Placed in "And" Linked Group   06/17/23 2100   06/17/23 2056  Acid Fast Culture with reflexed sensitivities  (AFB smear + Culture w reflexed sensitivities panel)  Once,   R       Placed in "And" Linked Group   06/17/23 2100            Vitals/Pain Today's Vitals   06/18/23 0315 06/18/23 0630 06/18/23 0645 06/18/23  0801  BP: 118/71 (!) 118/58 (!) 128/59 (!) 122/40  Pulse: 86 (!) 101 89 98  Resp: (!) 29 (!) 30 (!) 25 (!) 23  Temp:    97.7 F (36.5 C)  TempSrc:      SpO2: 94% 93% 95% 98%  Weight:      Height:      PainSc:        Isolation Precautions Droplet  precaution  Medications Medications  sodium chloride flush (NS) 0.9 % injection 3 mL (3 mLs Intravenous Given 06/17/23 2220)  acetaminophen (TYLENOL) tablet 1,000 mg (has no administration in time range)  levalbuterol (XOPENEX HFA) inhaler 2 puff (2 puffs Inhalation Given 06/18/23 0154)  metoprolol succinate (TOPROL-XL) 24 hr tablet 25 mg (25 mg Oral Given 06/17/23 2210)  ALPRAZolam (XANAX) tablet 0.25 mg (has no administration in time range)  insulin aspart (novoLOG) injection 0-6 Units ( Subcutaneous Not Given 06/18/23 0810)  apixaban (ELIQUIS) tablet 5 mg (5 mg Oral Given 06/17/23 2228)  guaiFENesin (MUCINEX) 12 hr tablet 600 mg (600 mg Oral Given 06/17/23 2210)  vancomycin (VANCOREADY) IVPB 750 mg/150 mL (has no administration in time range)  pantoprazole (PROTONIX) EC tablet 40 mg (has no administration in time range)  gabapentin (NEURONTIN) capsule 100 mg (100 mg Oral Patient Refused/Not Given 06/18/23 0007)  ipratropium (ATROVENT) nebulizer solution 0.5 mg (has no administration in time range)  potassium chloride SA (KLOR-CON M) CR tablet 20 mEq (has no administration in time range)  magnesium oxide (MAG-OX) tablet 400 mg (has no administration in time range)  budesonide (PULMICORT) nebulizer solution 0.25 mg (0.25 mg Nebulization Not Given 06/18/23 0901)  ceFEPIme (MAXIPIME) 2 g in sodium chloride 0.9 % 100 mL IVPB (has no administration in time range)  ceFEPIme (MAXIPIME) 2 g in sodium chloride 0.9 % 100 mL IVPB (0 g Intravenous Stopped 06/17/23 1948)  vancomycin (VANCOREADY) IVPB 1250 mg/250 mL (0 mg Intravenous Stopped 06/17/23 2101)  acetaminophen (TYLENOL) tablet 650 mg (650 mg Oral Given 06/17/23 1949)  lactated ringers bolus 500 mL (0 mLs Intravenous Stopped 06/17/23 2222)    Mobility walks with device     Focused Assessments    R Recommendations: See Admitting Provider Note  Report given to:   Additional Notes:

## 2023-06-18 NOTE — Progress Notes (Signed)
PT Cancellation Note  Patient Details Name: Matthew Hall MRN: [id number] DOB: 12/18/1945   Cancelled Treatment:    Reason Eval/Treat Not Completed: Other (comment). Pt wants to eat and take a nap. Wants to defer until later.    Angelina Ok [facility] 06/18/2023, 12:20 PM [name] PT Acute [employer] [phone]

## 2023-06-18 NOTE — ED Notes (Signed)
Pt reporting he feels short of breath, requesting medication.

## 2023-06-18 NOTE — Plan of Care (Signed)
Problem: Education: Goal: Ability to describe self-care measures that may prevent or decrease complications (Diabetes Survival Skills Education) will improve Outcome: Progressing Pt has a history of diabetes.  Per MD's orders pt's CBGs are to be checked ACHS.  He is on insulin coverage per slinding scale per MD's orders.  Problem: Education: Goal: Knowledge of General Education information will improve Description: Including pain rating scale, medication(s)/side effects and non-pharmacologic comfort measures Outcome: Progressing Pt was admitted from the ED for worsening respiratory symptoms over past month developing a productive cough, with yellow sputum, SOB and worsening generalized weakness.  At 1609 he t-max at 101.33F, with BP of 95/76 and RR of 35.  His MEWS then turned to Red.  Ghimire, MD was made aware and MEWS protocol implemented.  Tylenol 650mg  PO was given for his fever.  Pt is on IV abx per MD's orders.  After midnight pt will remain NPO for a bronchoscopy.     Problem: Clinical Measurements: Goal: Will remain free from infection Outcome: Progressing S/Sx of infection monitored and assessed q-shift.  At 1609 he t-max at 101.33F, with BP of 95/76 and RR of 35.  His MEWS then turned to Red.  Ghimire, MD was made aware and MEWS protocol implemented.  Tylenol 650mg  PO was given for his fever.  Pt is on IV abx per MD's orders.       Problem: Clinical Measurements: Goal: Respiratory complications will improve Outcome: Progressing Respiratory status monitored and assessed q-shift.  Pt is on room air with PO2 at 78-98% and respiration rate of 12-35 breaths per minute.  Pt has endorsed c/o SOB and DOE.  When dangling on the edge of the bed or ambulating in his room pt's PO2 does drop to 87% on room air.  Ghimire, MD is aware of findings.     Problem: Clinical Measurements: Goal: Cardiovascular complication will be avoided Outcome: Progressing At 1609 he t-max at 101.33F, with BP of 95/76  and RR of 35.  His MEWS then turned to Red.  Ghimire, MD was made aware and MEWS protocol implemented.  Tylenol 650mg  PO was given for his fever.  Pt is on IV abx per MD's orders.  He is on room air with PO2 at 78-98% and respiration rate of 12-35 breaths per minute.  Pt has endorsed c/o SOB and DOE.  When dangling on the edge of the bed or ambulating in his room pt's PO2 does drop to 87% on room air.  Ghimire, MD is aware of findings.            Problem: Activity: Goal: Risk for activity intolerance will decrease Outcome: Progressing Pt is codependent of all his ADLs.   Pt has endorsed c/o SOB and DOE.  When dangling on the edge of the bed or ambulating in his room pt's PO2 does drop to 87% on room air.  Ghimire, MD is aware of findings.   Problem: Nutrition: Goal: Adequate nutrition will be maintained Outcome: Progressing Pt is on a heart healthy diet per MD's orders and can tolerate it w/o s/sx of abdominal pain/ distention or n/v.  After midnight pt will remain NPO for a bronchoscopy.       Problem: Safety: Goal: Ability to remain free from injury will improve Outcome: Progressing Pt has remained free from falls thus far.  Instructed pt to utilize RN call light for assistance.  Hourly rounds performed. Bed in lowest position, locked with two upper side rails engaged.  Belongings and call light  within reach.    Problem: Skin Integrity: Goal: Risk for impaired skin integrity will decrease Outcome: Progressing Skin integrity monitored and assessed q-shift.  Instructed pt to q2 hours to prevent further skin impairment.  Tubes and drains assessed for device related pressures sores.  Pt is continent of both bowel and bladder.   Problem: Pain Managment: Goal: General experience of comfort will improve Outcome: Progressing Pt has denied pain thus far.

## 2023-06-18 NOTE — Progress Notes (Signed)
Pt was admitted from the ED for worsening respiratory symptoms over past month developing a productive cough, with yellow sputum, SOB and worsening generalized weakness.  Pt was transported via gurney accompanied by a transporter.  Assisted pt into his room.  Reviewed POC and made pt aware of his surroundings.  Instructed pt to utilize RN call light for assistance.  Bed locked, in lowest position, with two upper/ one lower side rails engaged.  Belongings and call light within reach.

## 2023-06-18 NOTE — Progress Notes (Signed)
PT Cancellation Note  Patient Details Name: Matthew Hall MRN: [id number] DOB: [birth year]   Cancelled Treatment:    Reason Eval/Treat Not Completed: Medical issues which prohibited therapy. Nurse reported pt with fever and hypotensive. Will check back tomorrow.   Angelina Ok [facility] 06/18/2023, 4:22 PM [name] PT Acute [employer] [phone]

## 2023-06-18 NOTE — Progress Notes (Signed)
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  He was seen multiple times in the hospital within the last month.  Recent discharge to a skilled nursing facility 2 months ago on Eliquis for acute stroke.  Afebrile.  Recently treated with Levaquin and then doxycycline.  Chest x-ray with extensive peribronchovascular nodularity, nodular consolidation with bronchiectasis and mucoid impaction.  Cultures drawn and patient started on vancomycin and cefepime in the ER.  Subjective: Patient seen and examined.  He was in the emergency room.  Patient tells me he feels better than yesterday.  He tells me he ate some food.  Describes how he has been treated with multiple doses of antibiotics but not feeling better.   Assessment & Plan:   Multifocal pneumonia, underlying COPD and bronchiectasis.  Sepsis present on admission.  Presented with leukocytosis, low-grade temperature, tachypnea and tachycardia. Suspected MAC.  No diagnosis yet. Agree with admission given severity of symptoms.  1 month of progressive symptoms.  Failed treatment with Levaquin azithromycin doxycycline. Antibiotics to treat bacterial pneumonia.  Currently on cefepime and vancomycin. AFB cultures pending.  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 Chest physiotherapy, incentive spirometry, deep breathing exercises, sputum induction, mucolytic's and bronchodilators.  Use Xopenex.  Tachyarrhythmia with albuterol. Sputum cultures, blood cultures, Legionella and streptococcal  antigen. Supplemental oxygen to keep saturations more than 90%. CT scan of the chest done that shows extensive nodularity, mucoid impaction.  This is more or less comparable to previous.  Will discuss with pulmonary.  Hypokalemia: Replaced.  Magnesium is 1.7.  Will also replace magnesium.  Chronic medical issues including Permanent A-fib, continuing metoprolol and Eliquis Ischemic stroke, on Eliquis.,  Statin intolerance Polyneuropathy, history of right foot drop.  Uses AFO for the right foot drop. Essential hypertension: Blood pressures stable.  Holding antihypertensives. Type 2 diabetes: Well-controlled.  Holding metformin.  SSI while inpatient. Anxiety: Takes alprazolam twice daily. History of bladder cancer: Stable as per patient.  DVT prophylaxis:  apixaban (ELIQUIS) tablet 5 mg   Code Status: Full code Family Communication: No family at the bedside Disposition Plan: Status is: Inpatient Remains inpatient appropriate because: Significant infection, shortness of breath     Consultants:  Pulmonary, will call consultation Palliative  Procedures:  None  Antimicrobials:  Vancomycin and cefepime 12/8---     Objective: Vitals:   06/18/23 0645 06/18/23 0801 06/18/23 0938 06/18/23 1016  BP: (!) 128/59 (!) 122/40  138/73  Pulse: 89 98 95 87  Resp: (!) 25 (!) 23 (!) 25 18  Temp:  97.7 F (36.5 C)  99 F (37.2 C)  TempSrc:    Oral  SpO2: 95% 98% 94% 98%  Weight:      Height:        Intake/Output Summary (Last 24 hours) at 06/18/2023 1318 Last data filed at 06/18/2023 1253 Gross per 24 hour  Intake 580 ml  Output 200 ml  Net 380 ml   Filed Weights   06/17/23 1626  Weight: 68 kg    Examination:  General: Looks fairly comfortable on minimum oxygen.  Chronically sick looking.  Not in any distress. Cardiovascular: S1-S2 normal.  Regular  rate rhythm. Respiratory: Bilateral poor air entry.  Poor inspiratory effort.  Mostly conducted upper airway  sounds. Gastrointestinal: Soft.  Nontender.  Bowel sound present. Ext: No swelling or edema.  No cyanosis. Neuro: Alert awake and oriented.  Right foot drop. Musculoskeletal: No deformities.    Data Reviewed: I have personally reviewed following labs and imaging studies  CBC: Recent Labs  Lab 06/17/23 1627 06/18/23 0249  WBC 15.3* 12.7*  NEUTROABS  --  9.5*  HGB 12.9* 12.3*  HCT 41.6 39.1  MCV 96.7 97.3  PLT 323 301   Basic Metabolic Panel: Recent Labs  Lab 06/17/23 1627 06/18/23 0249  NA 136 138  K 3.6 3.3*  CL 101 103  CO2 24 26  GLUCOSE 124* 105*  BUN 13 12  CREATININE 0.95 1.03  CALCIUM 8.8* 8.6*  MG  --  1.7  PHOS  --  3.1   GFR: Estimated Creatinine Clearance: 55.9 mL/min (by C-G formula based on SCr of 1.03 mg/dL). Liver Function Tests: Recent Labs  Lab 06/17/23 1847  AST 13*  ALT 14  ALKPHOS 43  BILITOT 1.2*  PROT 6.5  ALBUMIN 2.4*   No results for input(s): "LIPASE", "AMYLASE" in the last 168 hours. No results for input(s): "AMMONIA" in the last 168 hours. Coagulation Profile: No results for input(s): "INR", "PROTIME" in the last 168 hours. Cardiac Enzymes: No results for input(s): "CKTOTAL", "CKMB", "CKMBINDEX", "TROPONINI" in the last 168 hours. BNP (last 3 results) No results for input(s): "PROBNP" in the last 8760 hours. HbA1C: No results for input(s): "HGBA1C" in the last 72 hours. CBG: Recent Labs  Lab 06/17/23 1634 06/18/23 0800 06/18/23 1043  GLUCAP 129* 82 144*   Lipid Profile: No results for input(s): "CHOL", "HDL", "LDLCALC", "TRIG", "CHOLHDL", "LDLDIRECT" in the last 72 hours. Thyroid Function Tests: No results for input(s): "TSH", "T4TOTAL", "FREET4", "T3FREE", "THYROIDAB" in the last 72 hours. Anemia Panel: No results for input(s): "VITAMINB12", "FOLATE", "FERRITIN", "TIBC", "IRON", "RETICCTPCT" in the last 72 hours. Sepsis Labs: Recent Labs  Lab 06/17/23 1903  LATICACIDVEN 1.4    Recent Results (from the past  240 hour(s))  Blood culture (routine x 2)     Status: None (Preliminary result)   Collection Time: 06/17/23  6:47 PM   Specimen: BLOOD  Result Value Ref Range Status   Specimen Description BLOOD BLOOD LEFT HAND  Final   Special Requests   Final    BOTTLES DRAWN AEROBIC AND ANAEROBIC Blood Culture results may not be optimal due to an inadequate volume of blood received in culture bottles   Culture   Final    NO GROWTH < 24 HOURS Performed at [facility], [address] [place], [place] [address]    Report Status PENDING  Incomplete  Blood culture (routine x 2)     Status: None (Preliminary result)   Collection Time: 06/17/23  6:58 PM   Specimen: BLOOD  Result Value Ref Range Status   Specimen Description BLOOD RIGHT ANTECUBITAL  Final   Special Requests   Final    BOTTLES DRAWN AEROBIC AND ANAEROBIC Blood Culture results may not be optimal due to an inadequate volume of blood received in culture bottles   Culture   Final    NO GROWTH < 24 HOURS Performed at [facility], [address], [place], [place] [address]    Report Status PENDING  Incomplete  MRSA Next Gen by PCR, Nasal     Status: Abnormal   Collection Time:  06/17/23  7:51 PM   Specimen: Nasal Mucosa; Nasal Swab  Result Value Ref Range Status   MRSA by PCR Next Gen DETECTED (A) NOT DETECTED Final    Comment: RESULT CALLED TO, READ BACK BY AND VERIFIED WITH: C COBB,RN@2202  06/17/23 MK (NOTE) The GeneXpert MRSA Assay (FDA approved for NASAL specimens only), is one component of a comprehensive MRSA colonization surveillance program. It is not intended to diagnose MRSA infection nor to guide or monitor treatment for MRSA infections. Test performance is not FDA approved in patients less than 57 years old. Performed at [facility], [address], [place], [place] [address]   Respiratory (~20 pathogens) panel by PCR     Status: None   Collection Time: 06/17/23  9:58 PM   Specimen: Sputum; Respiratory   Result Value Ref Range Status   Adenovirus NOT DETECTED NOT DETECTED Final   Coronavirus 229E NOT DETECTED NOT DETECTED Final    Comment: (NOTE) The Coronavirus on the Respiratory Panel, DOES NOT test for the novel  Coronavirus (2019 nCoV)    Coronavirus HKU1 NOT DETECTED NOT DETECTED Final   Coronavirus NL63 NOT DETECTED NOT DETECTED Final   Coronavirus OC43 NOT DETECTED NOT DETECTED Final   Metapneumovirus NOT DETECTED NOT DETECTED Final   Rhinovirus / Enterovirus NOT DETECTED NOT DETECTED Final   Influenza A NOT DETECTED NOT DETECTED Final   Influenza B NOT DETECTED NOT DETECTED Final   Parainfluenza Virus 1 NOT DETECTED NOT DETECTED Final   Parainfluenza Virus 2 NOT DETECTED NOT DETECTED Final   Parainfluenza Virus 3 NOT DETECTED NOT DETECTED Final   Parainfluenza Virus 4 NOT DETECTED NOT DETECTED Final   Respiratory Syncytial Virus NOT DETECTED NOT DETECTED Final   Bordetella pertussis NOT DETECTED NOT DETECTED Final   Bordetella Parapertussis NOT DETECTED NOT DETECTED Final   Chlamydophila pneumoniae NOT DETECTED NOT DETECTED Final   Mycoplasma pneumoniae NOT DETECTED NOT DETECTED Final    Comment: Performed at [facility], [address] [place], [place] [address]         Radiology Studies: CT CHEST WO CONTRAST  Result Date: 06/17/2023 CLINICAL DATA:  Cough. Failed inferior treatment for multifocal pneumonia. 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 EXAM: CT CHEST WITHOUT CONTRAST TECHNIQUE: Multidetector CT imaging of the chest was performed following the standard protocol without IV contrast. RADIATION DOSE REDUCTION: This exam was performed according to the departmental dose-optimization program which includes automated exposure control, adjustment of the mA and/or kV according to patient size and/or use of iterative reconstruction technique. COMPARISON:  09/18/2022 FINDINGS: Cardiovascular: Heart is mildly enlarged. Scattered coronary artery and aortic  atherosclerosis. Mediastinum/Nodes: No mediastinal, hilar, or axillary adenopathy. Trachea and esophagus are unremarkable. Thyroid unremarkable. Lungs/Pleura: Biapical scarring. Peribronchovascular nodularity again noted throughout the lungs, most pronounced in the lower lobes where there is also bronchiectasis and mucous plugging, right greater than left. Findings are stable since prior study. No acute areas of consolidation. Trace right pleural effusion, new since prior study. Upper Abdomen: No acute findings Musculoskeletal: Chest wall soft tissues are unremarkable. No acute bony abnormality. IMPRESSION: Bronchiectasis with mucoid impaction and extensive peribronchovascular nodularity again noted in similar to prior study. Compatible with chronic MAC. Trace right pleural effusion. Coronary artery disease. Aortic Atherosclerosis (ICD10-I70.0). Electronically Signed   By: Charlett Nose M.D.   On: 06/17/2023 21:32   DG Chest 2 View  Result Date: 06/17/2023 CLINICAL DATA:  Confusion, weakness, recent pneumonia EXAM: CHEST - 2 VIEW COMPARISON:  04/26/2023 FINDINGS:  Multifocal patchy opacities, right lower lobe predominant, suggesting multifocal pneumonia. Small right pleural effusion. No pneumothorax. The heart is normal in size. Visualized osseous structures are within normal limits. IMPRESSION: Multifocal pneumonia, right lower lobe predominant. Small right pleural effusion. Electronically Signed   By: Charline Bills M.D.   On: 06/17/2023 17:13        Scheduled Meds:  apixaban  5 mg Oral BID   budesonide (PULMICORT) nebulizer solution  0.25 mg Nebulization BID   gabapentin  100 mg Oral QHS   guaiFENesin  600 mg Oral BID   insulin aspart  0-6 Units Subcutaneous TID WC   magnesium oxide  400 mg Oral BID   metoprolol succinate  25 mg Oral BID   pantoprazole  40 mg Oral Daily   potassium chloride  20 mEq Oral BID   sodium chloride flush  3 mL Intravenous Q12H   Continuous Infusions:  ceFEPime  (MAXIPIME) IV     vancomycin 750 mg (06/18/23 1252)     LOS: 1 day    Time spent: 50 minutes    Dorcas Carrow, MD [facility]

## 2023-06-18 NOTE — Consult Note (Signed)
NAME:  Matthew Hall, MRN:  [id number], DOB:  1945/08/17, LOS: 1 ADMISSION DATE:  06/17/2023 CONSULTATION DATE:  06/18/2023 REFERRING MD:  Jerral Ralph - [facility] CHIEF COMPLAINT:  Cough, bronchiectasis, ?MAC   History of Present Illness:  77 year old man who presented to [facility] 12/8 for increasing weakness and SOB with cough x 1 month. PMHx significant for HTN, HLD, Afib (on Eliquis), ischemic CVA, lymphocytic vasculitis, COPD with bronchiectasis (follows with [facility] - Dr. Vassie Loll), T2DM, GERD, diverticulosis, bladder CA, anxiety. Recently admitted 10/3 - 10/9 for ischemic CVA and discharged to SNF.  After discharge, [name] developed worsening productive cough and SOB; he was treated in the outpatient setting with a course of Levaquin, unfortunately without improvement. Patient was then seen at [facility] 11/29 for SOB and had improvement with nebulizer treatments. Patient was seen again for similar symptoms on 12/4 ([place]) with workup demonstrating RLL PNA (treated with doxycycline). Symptoms remained persistent with worsening weakness and patient presented to [facility] for further care. Broad-spectrum cefepime/vanc initiated.  He has been worked up for bronchiectasis with tree-in-bud opacities as an outpatient.  Sputum AFB was attempted but could not be obtained.  He reports failure to thrive, 20 pound weight loss over the past year.  PCCM consulted for further recommendations.  Pertinent Medical History:   Past Medical History:  Diagnosis Date   Anxiety    Aortic atherosclerosis (HCC)    Atrial fibrillation (HCC)    Basal cell carcinoma    Bladder cancer [facility])    Bladder tumor    Colon polyps    COPD (chronic obstructive pulmonary disease) (HCC)    Cutaneous vasculitis    Diabetic peripheral neuropathy (HCC) 12/27/2020   Diverticulosis    Essential hypertension    GERD (gastroesophageal reflux disease)    History of pneumonia    Nephrolithiasis    Osteoarthritis    Significant  Hospital Events: Including procedures, antibiotic start and stop dates in addition to other pertinent events   12/8 - Admitted to [facility] under [facility] service. 12/9 - Pulmonary consult  Interim History / Subjective:  PCCM consulted for recommendations re: SOB in the setting of bronchiectasis, ?MAC.  Objective:  Blood pressure 138/73, pulse 87, temperature 99 F (37.2 C), temperature source Oral, resp. rate 18, height 5\' 11"  (1.803 m), weight 68 kg, SpO2 98%.        Intake/Output Summary (Last 24 hours) at 06/18/2023 1340 Last data filed at 06/18/2023 1253 Gross per 24 hour  Intake 580 ml  Output 200 ml  Net 380 ml   Filed Weights   06/17/23 1626  Weight: 68 kg   Physical Examination: General: Chronically ill-appearing man in NAD. HEENT: [place]/AT, anicteric sclera, PERRL, moist mucous membranes. Neuro: Awake, oriented x 3.    CV: RRR, no m/g/r. PULM: Breathing even and unlabored. Lung fields clear. GI: Soft, nontender, nondistended. Normoactive bowel sounds. Extremities: No LE edema noted. Skin: Warm/dry.    Resolved Hospital Problem List:    Assessment & Plan:  [name] is seen in consultation at the request of Dr. Jerral Ralph [facility]) for recommendations on further evaluation and management of SOB, possible MAC.  77 year old man who presented to [facility] 12/8 for increasing weakness and SOB with cough x 1 month. PMHx significant for HTN, HLD, Afib (on Eliquis), ischemic CVA, COPD with bronchiectasis, T2DM, GERD, diverticulosis, bladder CA, anxiety.  Follows with Dr. Vassie Loll in the office; bronchiectasis has been present since at least 2009 (not seen on CT 1995). FEV1 59% in 2009,  worse in 2024.  COPD with bronchiectasis ?MAC CT Chest 12/8 demonstrated bronchiectasis with mucoid impaction and extensive peribronchovascular nodularity, similar to prior study, ?chronic MAC.  Notes 20 pound weight loss with failure to thrive and recurrent pneumonia  - Supplemental O2 support - Wean O2 for sat  > 88-92% - Pulmonary hygiene - Will plan for bronchoscopy with BAL 12/10, NPO at midnight  Best Practice: (right click and "Reselect all SmartList Selections" daily)   Per Primary Team   Signature:   Chilton Greathouse MD [facility] See Amion for pager  If no response to pager , please call [phone] until 7pm After 7:00 pm call Elink  [phone] 06/18/2023, 3:06 PM

## 2023-06-18 NOTE — Evaluation (Signed)
Clinical/Bedside Swallow Evaluation Patient Details  Name: Matthew Hall MRN: [id number] Date of Birth: 1945/09/16  Today's Date: 06/18/2023 Time: SLP Start Time (ACUTE ONLY): 1400 SLP Stop Time (ACUTE ONLY): 1415 SLP Time Calculation (min) (ACUTE ONLY): 15 min  Past Medical History:  Past Medical History:  Diagnosis Date   Anxiety    Aortic atherosclerosis (HCC)    Atrial fibrillation (HCC)    Basal cell carcinoma    Bladder cancer (HCC)    Bladder tumor    Colon polyps    COPD (chronic obstructive pulmonary disease) (HCC)    Cutaneous vasculitis    Diabetic peripheral neuropathy (HCC) 12/27/2020   Diverticulosis    Essential hypertension    GERD (gastroesophageal reflux disease)    History of pneumonia    Nephrolithiasis    Osteoarthritis    Past Surgical History:  Past Surgical History:  Procedure Laterality Date   COLONOSCOPY     Kidney stones removed  1996   SKIN CANCER EXCISION Left    2003   TRANSURETHRAL RESECTION OF BLADDER TUMOR  1996   HPI:  Matthew Hall is a 77 y.o. male with hx of COPD, bronchiectasis, ? MAC, permanent Afib on AC, cardiomyopathy, mid range EF, ischemic CVA, polyneuropathy, R sided foot drop, vasculitis, bladder CA, HTN, DM, who presented on 06/17/23 with persistent respiratory symptoms over past month and worsening weakness.  Reports that approximately 1 month ago after discharge to SNF (recent hospitalization with stroke) developed worsening cough, productive with yellow sputum, and associated shortness of breath.  He was treated by outpatient provider with course of levofloxacin.  However cough did not improve.  He was seen at [facility] on 11/29 no imaging done that encounter improved with nebulizer treatments.  On 12/4 was seen in [place] at [place] and had to have pneumonia with worst in the right lower lobe and treated with course of doxycycline.  Returns today due to persistent cough and sputum, shortness of breath.  As well as  worsening fatigue, generalized weakness, decreased appetite, and some difficulty with executive functioning per his daughter. Chest CT 12/8 indicated Bronchiectasis with mucoid impaction and extensive  peribronchovascular nodularity again noted in similar to prior  study. Compatible with chronic MAC.     Trace right pleural effusion.  ST consulted for swallow evaluation.    Assessment / Plan / Recommendation  Clinical Impression  Pt seen for clinical swallowing evaluation with a normal oropharyngeal swallow noted with solids/thin liquids via straw.  OME unremarkable with low vocal intensity noted within conversation.  Pt with hx of COPD, so education provided re: breathing/swallowing reciprocity and swallowing strategies to utilize during po intake for safety including smaller volume of liquids/bites of food and slow rate with breathing breaks prn for fatigue factor during po consumption.  Pt in agreement, but chart review revealed decreased executive functioning post-CVA, so f/u at next venue may be indicated if symptoms persist.  No overt s/sx of aspiration observed during snack, but cognitive-based dysphagia cannot be ruled out.  Recommend continue current diet of Regular/thin liquids with slow rate/small bites sips instituted during meals/snacks for safety.  Medication should be provided single pills with liquids.  Thank you for this consult. SLP Visit Diagnosis: Dysphagia, unspecified (R13.10)    Aspiration Risk  Mild aspiration risk    Diet Recommendation   Thin;Age appropriate regular  Medication Administration: Whole meds with liquid (single pills with liquids or split if larger)    Other  Recommendations Oral Care Recommendations:  Oral care BID    Recommendations for follow up therapy are one component of a multi-disciplinary discharge planning process, led by the attending physician.  Recommendations may be updated based on patient status, additional functional criteria and insurance  authorization.  Follow up Recommendations No SLP follow up      Assistance Recommended at Discharge  TBD  Functional Status Assessment Patient has had a recent decline in their functional status and demonstrates the ability to make significant improvements in function in a reasonable and predictable amount of time.  Frequency and Duration Other (Comment) (evaluation only)          Prognosis Prognosis for improved oropharyngeal function: Good      Swallow Study   General Date of Onset: 06/17/23 HPI: Matthew Hall is a 77 y.o. male with hx of COPD, bronchiectasis, ? 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  Reports that approximately 1 month ago after discharge to SNF (recent hospitalization with stroke) developed worsening cough, productive with yellow sputum, and associated shortness of breath.  He was treated by outpatient provider with course of levofloxacin.  However cough did not improve.  He was seen at [facility] on 11/29 no imaging done that encounter improved with nebulizer treatments.  On 12/4 was seen in [place] at [place] and had to have pneumonia with worst in the right lower lobe and treated with course of doxycycline.  Returns today due to persistent cough and sputum, shortness of breath.  As well as worsening fatigue, generalized weakness, decreased appetite, and some difficulty with executive functioning per his daughter. Chest CT 12/8 indicated Bronchiectasis with mucoid impaction and extensive  peribronchovascular nodularity again noted in similar to prior  study. Compatible with chronic MAC.     Trace right pleural effusion.  ST consulted for swallow evaluation. Type of Study: Bedside Swallow Evaluation Previous Swallow Assessment: n/a Diet Prior to this Study: Regular;Thin liquids  (Level 0) Temperature Spikes Noted: Yes (99 low grade) Respiratory Status: Room air History of Recent Intubation: No Behavior/Cognition: Alert;Cooperative Oral Cavity Assessment: Within Functional Limits Oral Care Completed by SLP: No Oral Cavity - Dentition: Missing dentition;Adequate natural dentition Vision: Functional for self-feeding Self-Feeding Abilities: Able to feed self Patient Positioning: Upright in bed Baseline Vocal Quality: Low vocal intensity Volitional Cough: Strong Volitional Swallow: Able to elicit    Oral/Motor/Sensory Function Overall Oral Motor/Sensory Function: Within functional limits   Ice Chips Ice chips: Not tested   Thin Liquid Thin Liquid: Within functional limits Presentation: Self Fed;Straw    Nectar Thick Nectar Thick Liquid: Not tested   Honey Thick Honey Thick Liquid: Not tested   Puree Puree: Not tested   Solid     Solid: Within functional limits Presentation: Self Fed      Pat Johnney Scarlata,M.S.,CCC-SLP 06/18/2023,2:29 PM

## 2023-06-19 ENCOUNTER — Encounter (HOSPITAL_COMMUNITY)
Admission: EM | Disposition: A | Discharge status: Home Health | Payer: Self-pay | Source: Home / Self Care | Attending: Internal Medicine

## 2023-06-19 ENCOUNTER — Encounter (HOSPITAL_COMMUNITY): Payer: Self-pay | Admitting: Internal Medicine

## 2023-06-19 ENCOUNTER — Inpatient Hospital Stay (HOSPITAL_COMMUNITY): Payer: Medicare Other | Admitting: Anesthesiology

## 2023-06-19 DIAGNOSIS — J189 Pneumonia, unspecified organism: Secondary | ICD-10-CM | POA: Diagnosis not present

## 2023-06-19 DIAGNOSIS — Z7189 Other specified counseling: Secondary | ICD-10-CM | POA: Diagnosis not present

## 2023-06-19 DIAGNOSIS — Z515 Encounter for palliative care: Secondary | ICD-10-CM | POA: Diagnosis not present

## 2023-06-19 DIAGNOSIS — J479 Bronchiectasis, uncomplicated: Secondary | ICD-10-CM

## 2023-06-19 DIAGNOSIS — J471 Bronchiectasis with (acute) exacerbation: Secondary | ICD-10-CM

## 2023-06-19 HISTORY — PX: BRONCHIAL WASHINGS: SHX5105

## 2023-06-19 HISTORY — PX: VIDEO BRONCHOSCOPY: SHX5072

## 2023-06-19 LAB — GLUCOSE, CAPILLARY
Glucose-Capillary: 91 mg/dL (ref 70–99)
Glucose-Capillary: 76 mg/dL (ref 70–99)
Glucose-Capillary: 71 mg/dL (ref 70–99)
Glucose-Capillary: 82 mg/dL (ref 70–99)
Glucose-Capillary: 68 mg/dL — ABNORMAL LOW (ref 70–99)
Glucose-Capillary: 76 mg/dL (ref 70–99)
Glucose-Capillary: 100 mg/dL — ABNORMAL HIGH (ref 70–99)
Glucose-Capillary: 117 mg/dL — ABNORMAL HIGH (ref 70–99)

## 2023-06-19 LAB — BODY FLUID CELL COUNT WITH DIFFERENTIAL
Eos, Fluid: 0 %
Lymphs, Fluid: 2 %
Monocyte-Macrophage-Serous Fluid: 3 % — ABNORMAL LOW (ref 50–90)
Neutrophil Count, Fluid: 95 % — ABNORMAL HIGH (ref 0–25)
Total Nucleated Cell Count, Fluid: 1138 uL — ABNORMAL HIGH (ref 0–1000)

## 2023-06-19 SURGERY — VIDEO BRONCHOSCOPY WITHOUT FLUORO
Anesthesia: Monitor Anesthesia Care

## 2023-06-19 MED ORDER — PROPOFOL 10 MG/ML IV BOLUS
INTRAVENOUS | Status: DC | PRN
  Administered 2023-06-19: 150 mg via INTRAVENOUS

## 2023-06-19 MED ORDER — ESMOLOL HCL 100 MG/10ML IV SOLN
INTRAVENOUS | Status: DC | PRN
  Administered 2023-06-19 (×2): 10 mg via INTRAVENOUS

## 2023-06-19 MED ORDER — SUGAMMADEX SODIUM 200 MG/2ML IV SOLN
INTRAVENOUS | Status: DC | PRN
  Administered 2023-06-19: 200 mg via INTRAVENOUS

## 2023-06-19 MED ORDER — FENTANYL CITRATE (PF) 250 MCG/5ML IJ SOLN
INTRAMUSCULAR | Status: DC | PRN
  Administered 2023-06-19: 50 ug via INTRAVENOUS

## 2023-06-19 MED ORDER — SODIUM CHLORIDE 0.9 % IV SOLN: INTRAVENOUS | Status: DC | PRN

## 2023-06-19 MED ORDER — FENTANYL CITRATE (PF) 100 MCG/2ML IJ SOLN
INTRAMUSCULAR | Status: AC
  Filled 2023-06-19: qty 2

## 2023-06-19 MED ORDER — PHENYLEPHRINE HCL-NACL 20-0.9 MG/250ML-% IV SOLN
INTRAVENOUS | Status: DC | PRN
  Administered 2023-06-19: 100 ug/min via INTRAVENOUS

## 2023-06-19 MED ORDER — PHENYLEPHRINE 80 MCG/ML (10ML) SYRINGE FOR IV PUSH (FOR BLOOD PRESSURE SUPPORT)
PREFILLED_SYRINGE | INTRAVENOUS | Status: DC | PRN
  Administered 2023-06-19: 160 ug via INTRAVENOUS
  Administered 2023-06-19 (×3): 80 ug via INTRAVENOUS
  Administered 2023-06-19 (×3): 160 ug via INTRAVENOUS

## 2023-06-19 MED ORDER — LACTATED RINGERS IV SOLN: INTRAVENOUS | Status: DC | PRN

## 2023-06-19 MED ORDER — ROCURONIUM BROMIDE 10 MG/ML (PF) SYRINGE
PREFILLED_SYRINGE | INTRAVENOUS | Status: DC | PRN
  Administered 2023-06-19: 20 mg via INTRAVENOUS
  Administered 2023-06-19: 50 mg via INTRAVENOUS

## 2023-06-19 MED ORDER — MUPIROCIN 2 % EX OINT
1.0000 | TOPICAL_OINTMENT | Freq: Two times a day (BID) | CUTANEOUS | Status: DC
  Administered 2023-06-19 – 2023-06-22 (×7): 1 via NASAL
  Filled 2023-06-19 (×2): qty 22

## 2023-06-19 MED ORDER — LIDOCAINE 2% (20 MG/ML) 5 ML SYRINGE
INTRAMUSCULAR | Status: DC | PRN
  Administered 2023-06-19: 60 mg via INTRAVENOUS

## 2023-06-19 MED ORDER — CHLORHEXIDINE GLUCONATE CLOTH 2 % EX PADS
6.0000 | MEDICATED_PAD | Freq: Every day | CUTANEOUS | Status: DC
  Administered 2023-06-19 – 2023-06-22 (×4): 6 via TOPICAL

## 2023-06-19 NOTE — Consult Note (Addendum)
Palliative Medicine Inpatient Consult Note  Consulting Provider:  Dorcas Carrow, MD   Reason for consult:   Palliative Care Consult Services Palliative Medicine Consult  Reason for Consult? 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 Admitted for shortness of breath - identified to have a multifocal PNA. Palliative care has been asked to get involved to further discuss goals of care.   Clinical Assessment/Goals of Care:  *Please note that this is a verbal dictation therefore any spelling or grammatical errors are due to the "Dragon Medical One" system interpretation.  I have reviewed medical records including EPIC notes, labs and imaging, received report from bedside RN, assessed the patient.    I met with Matthew Hall to further discuss diagnosis prognosis, GOC, EOL wishes, disposition and options.   I introduced Palliative Medicine as specialized medical care for people living with serious illness. It focuses on providing relief from the symptoms and stress of a serious illness. The goal is to improve quality of life for both the patient and the family.  Medical History Review and Understanding:  A review of Matthew Hall's past medical history inclusive of recent stroke with resulting right foot drop, A-fib, COPD, MAC, diabetes, diabetic neuropathy, bladder cancer, and hypertension was held.  Social History:  Matthew Hall lives in [place].  He has been married to his wife for the past 54 years.  They share 3 children and multiple grandchildren.  He was formally in [employer] and then worked for [employer] for 36 years.  He shares that he likes going to the mountains [place] beach.  He has a Sales promotion account executive though he has not been able to use it  as frequently as his wife is disabled and bedbound.  He is a man of faith and practices within the [facility] denomination.  Functional and Nutritional State:  Preceding hospitalization Matthew Hall was functional of bADLs though he shares in the setting of his recent stroke he was moving more slowly as he has right foot drop.  He has a rollator which helps with stability.  He is no longer driving.  At one point in time he did have a caregiver for his wife though unfortunately this person no longer works for him and he is his wife's dedicated full-time caregiver.  Advance Directives:  A detailed discussion was had today regarding advanced directives.  Patient does have advanced directives and his wife, Matthew Hall is his first surrogate decision maker is daughter, Matthew Hall is his second Social research officer, government.  Matthew Hall does share he has a living well though he was advised by his lawyers to not bring that into the hospital.  Code Status:  Concepts specific to code status, artifical feeding and hydration, continued IV antibiotics and rehospitalization was had.  The difference between a aggressive medical intervention path  and a palliative comfort care path for this patient at this time was had.   Encouraged patient/family to consider DNR/DNI status understanding evidenced based poor outcomes in similar hospitalized patient, as the cause of arrest is likely associated with advanced chronic/terminal illness rather than an easily reversible acute cardio-pulmonary event. I explained that DNR/DNI does not change the medical plan and it only comes into effect after a person has arrested (died).  It is a protective measure to keep [place] from harming  the patient in their last moments of life.   Matthew Hall shares he has had multiple friends who have undergone resuscitative efforts and done "fine".  He shares he would want attempts made to prolong his life as he feels he could fare well.  I explained very bluntly openly and  honestly the reality should he go through a true resuscitative effort.  I also shared in the setting of patient's lung disease the worry that he may not be able to get off of mechanical ventilation once placed on it.  Matthew Hall does share that although he wants aggressive efforts at this time he would not want to live in a vegetative state long-term.  Provided  "Hard Choices for [employer]" booklet.   Discussion:  Matthew Hall and I discussed the reason for admission inclusive of his sepsis in the setting of pneumonia.  Matthew Hall and I discussed his lung disease.  We reviewed COPD and the severity of his disease.  We reviewed in the past and has been identified through medical records as severe.  We discussed patient's multifocal pneumonia and the concern of mycobacterium avium complex (MAC) bacteria.  We reviewed the plan for bronchoscopy today which the patient does feel has been well explained.  I shared the progressive nature of COPD which patient does express awareness of.  He does share with me he knows it will get worse despite present efforts eventually to the point whereby he may be severely limited from a breathing perspective.  We reviewed heralds functional deficits as a result of his stroke and how it has limited his ability to care for his wife.  He and I discussed the social concerns associated with this.  Matthew Hall asked if there are any resources which can be provided to which I shared I would request the social worker provide additional information.  At this point patient is hopeful for improvement.  His goals are to get back home with his wife.  Discussed the plan for ongoing goals of care conversations moving forward.  Discussed the importance of continued conversation with family and their  medical providers regarding overall plan of care and treatment options, ensuring decisions are within the context of the patients values and GOCs. _____________________________ Addendum:  I met with  Matthew Hall and his daughter, Matthew Hall later in the morning.  We again discussed the above in great detail.  Matthew Hall worries that there is a component of denial on the part of Matthew Hall regarding the severity of his lung disease.  She also shares with me an abundance of concerns about Matthew Hall social situation.  We reviewed these in great detail and I shared that I would reach out to the social worker for further advisement.  Decision Maker: Matthew Hall,Matthew Hall (Spouse): [phone] (Mobile)   SUMMARY OF RECOMMENDATIONS   FULL CODE  Matthew Hall has advanced directives though was advised by his elder law attorney to not bring them to the hospital  Matthew Hall and I discussed his disease, the progression of disease, and long-term prognosis  At this point in time Raanan would like all aggressive medical interventions and efforts made  Appreciate Pulmonologist and hospitalist educated patient and his daughter on disease severity  Appreciate involvement of medical social work team to address the social concerns  Patient does not feel ongoing palliative support is needed at this time  Code Status/Advance Care Planning: FULL CODE  Palliative Prophylaxis:  Aspiration, Bowel Regimen, Delirium Protocol, Frequent Pain Assessment, Oral Care, Palliative Wound Care, and Turn Reposition  Additional Recommendations (Limitations, Scope,  Preferences): Continue current care  Psycho-social/Spiritual:  Desire for further Chaplaincy support: Patient has [facility] Additional Recommendations: Education on COPD progression.    Prognosis: Limited overall  Discharge Planning: Discharge plan is uncertain awaiting PT and OT eval's  Vitals:   06/19/23 0746 06/19/23 0751  BP: 102/71   Pulse: (!) 104   Resp: 18   Temp: 98.8 F (37.1 C)   SpO2: 95% 93%    Intake/Output Summary (Last 24 hours) at 06/19/2023 1035 Last data filed at 06/19/2023 0827 Gross per 24 hour  Intake 1080 ml  Output 600 ml  Net 480 ml   Last Weight   Most recent update: 06/19/2023  4:00 AM    Weight  72.6 kg (160 lb 0.9 oz)            Gen: Elderly Caucasian male in no acute distress HEENT: moist mucous membranes CV: Regular rate and irregular rhythm PULM: On room air breathing is even and nonlabored at this time ABD: soft/nontender EXT: No edema Neuro: Alert and oriented x3  PPS: 40%   This conversation/these recommendations were discussed with patient primary care team, Dr. Jerral Ralph  Total Time: 120 Billing based on MDM: High  Problems Addressed: One acute or chronic illness or injury that poses a threat to life or bodily function  Amount and/or Complexity of Data: Category 3:Discussion of management or test interpretation with external physician/other qualified health care professional/appropriate source (not separately reported)  Risks: Decision regarding hospitalization or escalation of hospital care and Decision not to resuscitate or to de-escalate care because of poor prognosis ______________________________________________________ Lamarr Matthew Hall Palliative Medicine Team Team Cell Phone: [phone] Please utilize secure chat with additional questions, if there is no response within 30 minutes please call the above phone number  Palliative Medicine Team providers are available by phone from 7am to 7pm daily and can be reached through the team cell phone.  Should this patient require assistance outside of these hours, please call the patient's attending physician.

## 2023-06-19 NOTE — Progress Notes (Signed)
Called daughter [name] to give update. She requested I call back later- I will call after bronch this afternoon.  Steffanie Dunn, DO 06/19/23 12:16 PM [facility]  For contact information, see Amion. If no response to pager, please call PCCM consult pager. After hours, 7PM- 7AM, please call Elink.

## 2023-06-19 NOTE — Transfer of Care (Signed)
Immediate Anesthesia Transfer of Care Note  Patient: Matthew Hall  Procedure(s) Performed: VIDEO BRONCHOSCOPY WITHOUT FLUORO BRONCHIAL WASHINGS  Patient Location: Endoscopy Unit  Anesthesia Type:General  Level of Consciousness: awake, oriented, and patient cooperative  Airway & Oxygen Therapy: Patient Spontanous Breathing and Patient connected to face mask oxygen  Post-op Assessment: Report given to RN and Post -op Vital signs reviewed and stable  Post vital signs: Reviewed and stable  Last Vitals:  Vitals Value Taken Time  BP 131/73 06/19/23 1535  Temp 36.7 C 06/19/23 1520  Pulse 110 06/19/23 1540  Resp 30 06/19/23 1540  SpO2 96 % 06/19/23 1540  Vitals shown include unfiled device data.  Last Pain:  Vitals:   06/19/23 1520  TempSrc: Temporal  PainSc:          Complications: No notable events documented.

## 2023-06-19 NOTE — Progress Notes (Signed)
Daughter [name] updated after the procedure. She is wondering if he would benefit from PR after discharge. She agrees with flutter valve and airway clearance therapy. Questions about potential discharge were deferred until tomorrow when we see how he does post-bronch.  Steffanie Dunn, DO 06/19/23 3:14 PM [facility]  For contact information, see Amion. If no response to pager, please call PCCM consult pager. After hours, 7PM- 7AM, please call Elink.

## 2023-06-19 NOTE — Evaluation (Signed)
Physical Therapy Evaluation Patient Details Name: Matthew Hall MRN: [id number] DOB: 06-14-1946 Today's Date: 06/19/2023  History of Present Illness  Pt is 77 year old presented [facility]  [facility]/23 for multifocal PNA and sepsis. PMH - recent CVA's, rt foot drop, atrial fibrillation, COPD, hypertension, vasculitis, diabetes, diabetic neuropathy, bladder cancer.  Clinical Impression   Pt admitted with above diagnosis. Lives at home with his wife (caregiver for her), in a single-level home (does not need to go into basement) with 1 steps to enter; Prior to admission, pt was able to manage differently since recent CVA, but at or near modified independent level (toe lifter for R shoe); Has not driven since CVA; Presents to PT with a functional decline compared t baseline, decr activity tolerance, generalized weakness; Very hungry since he's NPO for bronchoscopy; Able to get to EOB slowly, but mod Independently; Min assist to stand and take short steps at EOB; ;  Pt currently with functional limitations due to the deficits listed below (see PT Problem List). Pt will benefit from skilled PT to increase their independence and safety with mobility to allow discharge to the venue listed below.       Session conducted on Room Air; Noting that pt's O2 sats decr to 85% with minimal activity adn while talking; increases back to 92% and higher with deliberate controlled breathing -- did not need to restart supplemental O2 to get sats higher      If plan is discharge home, recommend the following: Assistance with cooking/housework;Assist for transportation   Can travel by private vehicle        Equipment Recommendations Rollator (4 wheels);Other (comment) (will consider rollator)  Recommendations for Other Services  OT consult    Functional Status Assessment Patient has had a recent decline in their functional status and demonstrates the ability to make significant improvements in function in a  reasonable and predictable amount of time.     Precautions / Restrictions Precautions Precautions: Other (comment) Precaution Comments: decr activity tolerance Required Braces or Orthoses: Other Brace Other Brace: Has a toe-lifter for R shoe (perhaps dtr can bring it in) Restrictions Weight Bearing Restrictions: No      Mobility  Bed Mobility Overal bed mobility: Modified Independent             General bed mobility comments: incr time; HOB elevated    Transfers Overall transfer level: Needs assistance Equipment used: 1 person hand held assist Transfers: Sit to/from Stand, Bed to chair/wheelchair/BSC Sit to Stand: Min assist   Step pivot transfers: Min assist       General transfer comment: handheld assist to steady with poser up and with small steps towrd [facility]    Ambulation/Gait                  Stairs            Wheelchair Mobility     Tilt Bed    Modified Rankin (Stroke Patients Only)       Balance Overall balance assessment: Needs assistance   Sitting balance-Leahy Scale: Fair       Standing balance-Leahy Scale: Poor                               Pertinent Vitals/Pain Pain Assessment Pain Assessment: No/denies pain    Home Living Family/patient expects to be discharged to:: Private residence Living Arrangements: Spouse/significant other Available Help at Discharge: Family;Available PRN/intermittently  Type of Home: House Home Access: Stairs to enter   Entrance Stairs-Number of Steps: 1 Alternate Level Stairs-Number of Steps: flight Home Layout: Two level;Able to live on main level with bedroom/bathroom Home Equipment: Tub bench;Wheelchair Financial trader (4 wheels);Rolling Walker (2 wheels);Hand held shower head;Adaptive equipment (much of their equipment is for his disabled wife)      Prior Function Prior Level of Function : Independent/Modified Independent             Mobility Comments: Primary  caretaker for wife who pt states is "bedridden and disabled". He was assisting with transfers, bathing, dressing, toileting, and was doing all of the cooking/cleaning at home; Now with difficulty doing this since the CVA       Extremity/Trunk Assessment   Upper Extremity Assessment Upper Extremity Assessment: Generalized weakness    Lower Extremity Assessment Lower Extremity Assessment: Generalized weakness       Communication   Communication Communication: Other (comment) (Noting dyspnea when talks a lot)  Cognition Arousal: Alert Behavior During Therapy: WFL for tasks assessed/performed Overall Cognitive Status: Within Functional Limits for tasks assessed (for simple tasks)                                 General Comments: Needs occaional cues to self-monitor for activity tolerance        General Comments General comments (skin integrity, edema, etc.): Session conducted on Room Air; Noting that pt's O2 sats decr to 85% with minimal activity adn while talking; increases back to 92% and higher with deliberate controlled breathing -- did not need to restart supplemental O2 to get sats higher    Exercises     Assessment/Plan    PT Assessment Patient needs continued PT services  PT Problem List Decreased strength;Decreased activity tolerance;Decreased balance;Decreased mobility;Decreased coordination;Decreased knowledge of use of DME;Decreased safety awareness;Decreased knowledge of precautions;Cardiopulmonary status limiting activity       PT Treatment Interventions DME instruction;Gait training;Stair training;Functional mobility training;Therapeutic activities;Therapeutic exercise;Balance training;Neuromuscular re-education;Cognitive remediation;Patient/family education    PT Goals (Current goals can be found in the Care Plan section)  Acute Rehab PT Goals Patient Stated Goal: get bronchoscopy done and be able to eat PT Goal Formulation: With patient Time  For Goal Achievement: 07/03/23 Potential to Achieve Goals: Good    Frequency Min 1X/week     Co-evaluation               AM-PAC PT "6 Clicks" Mobility  Outcome Measure Help needed turning from your back to your side while in a flat bed without using bedrails?: None Help needed moving from lying on your back to sitting on the side of a flat bed without using bedrails?: None Help needed moving to and from a bed to a chair (including a wheelchair)?: A Little Help needed standing up from a chair using your arms (e.g., wheelchair or bedside chair)?: A Little Help needed to walk in hospital room?: A Lot Help needed climbing 3-5 steps with a railing? : A Lot 6 Click Score: 18    End of Session Equipment Utilized During Treatment: Other (comment) (vitals machine) Activity Tolerance: Other (comment) (Notable DOE) Patient left: in bed;with call bell/phone within reach   PT Visit Diagnosis: Other abnormalities of gait and mobility (R26.89);Other (comment) (decr functional endurance)    Time: 4166-0630 PT Time Calculation (min) (ACUTE ONLY): 25 min   Charges:   PT Evaluation $PT Eval Moderate Complexity: 1  Mod PT Treatments $Therapeutic Activity: 8-22 mins PT General Charges $$ ACUTE PT VISIT: 1 Visit         Van Clines, PT  Acute Rehabilitation Services Office [phone] Secure Chat welcomed   Levi Aland 06/19/2023, 12:59 PM

## 2023-06-19 NOTE — Progress Notes (Signed)
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  He was seen multiple times in the hospital within the last month.  Recent discharge to a skilled nursing facility 2 months ago on Eliquis for acute stroke.  Afebrile.  Recently treated with Levaquin and then doxycycline.  Chest x-ray with extensive peribronchovascular nodularity, nodular consolidation with bronchiectasis and mucoid impaction.  Cultures drawn and patient started on vancomycin and cefepime in the ER. MRSA swab positive.  Cultures negative so far.  Subjective:  Patient seen and examined.  Temperature maximum 101 overnight.  Has some dry cough.  Daughter on the phone, explained about bronchoscopy procedure today.  Not mobilized yet.  Not sure how he feels when he mobilizes.   Assessment & Plan:   Multifocal pneumonia, underlying COPD and bronchiectasis.  Sepsis present on admission.  Presented with leukocytosis, low-grade temperature, tachypnea and tachycardia.  Suspected MAC.  No diagnosis yet. 1 month of progressive symptoms.  Failed treatment with Levaquin azithromycin doxycycline. Antibiotics to treat bacterial pneumonia.  Currently on cefepime and vancomycin.  Continue. AFB cultures pending.  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 Chest physiotherapy, incentive spirometry, deep breathing exercises, sputum induction, mucolytic's and bronchodilators.  Use Xopenex.  Tachyarrhythmia with albuterol. Supplemental oxygen to keep saturations more than 90%. CT scan of the chest done  that shows extensive nodularity, mucoid impaction.  This is more or less comparable to previous.  Seen by pulmonary, patient for bronc and bronchoalveolar lavage today.  Hypokalemia: Replaced.  Adequate.  Chronic medical issues including Permanent A-fib, continuing metoprolol.  Eliquis on hold today. Ischemic stroke, on Eliquis.,  Statin intolerance Polyneuropathy, history of right foot drop.  Uses AFO for the right foot drop. Essential hypertension: Blood pressures stable.  Holding antihypertensives. Type 2 diabetes: Well-controlled.  Holding metformin.  SSI while inpatient. Anxiety: Takes alprazolam twice daily. History of bladder cancer: Stable as per patient.  DVT prophylaxis: SCDs   Code Status: Full code.  Palliative care consulted. Family Communication: Patient's daughter [name] on the phone. Disposition Plan: Status is: Inpatient Remains inpatient appropriate because: Significant infection, shortness of breath.  Inpatient procedures.     Consultants:  Pulmonary,  Palliative  Procedures:  None  Antimicrobials:  Vancomycin and cefepime 12/8---     Objective: Vitals:   06/18/23 2359 06/19/23 0400 06/19/23 0746 06/19/23 0751  BP: 113/77 123/74 102/71   Pulse: 72 86 (!) 104   Resp: 18 18 18    Temp: 98.5 F (36.9 C) 98 F (36.7 C) 98.8 F (37.1 C)   TempSrc: Axillary Axillary Oral   SpO2: 96% 96% 95% 93%  Weight:  72.6 kg    Height:        Intake/Output Summary (Last 24 hours) at 06/19/2023 1143 Last data filed at 06/19/2023 0827 Gross per 24 hour  Intake 840 ml  Output 600 ml  Net 240 ml   Filed Weights   06/17/23 1626 06/19/23 0400  Weight: 68 kg 72.6 kg    Examination:  General: Looks fairly comfortable today.  On minimal oxygen. Cardiovascular: S1-S2 normal.  Regular rate rhythm. Respiratory: Bilateral poor air entry.  Poor inspiratory effort.  Mostly conducted upper airway sounds. Gastrointestinal: Soft.  Nontender.  Bowel sound  present. Ext: No swelling or edema.  No cyanosis. Neuro: Alert awake and oriented.  Right foot drop. Musculoskeletal: No deformities.    Data Reviewed: I have personally reviewed following labs and imaging studies  CBC: Recent Labs  Lab 06/17/23 1627 06/18/23 0249  WBC 15.3* 12.7*  NEUTROABS  --  9.5*  HGB 12.9* 12.3*  HCT 41.6 39.1  MCV 96.7 97.3  PLT 323 301   Basic Metabolic Panel: Recent Labs  Lab 06/17/23 1627 06/18/23 0249  NA 136 138  K 3.6 3.3*  CL 101 103  CO2 24 26  GLUCOSE 124* 105*  BUN 13 12  CREATININE 0.95 1.03  CALCIUM 8.8* 8.6*  MG  --  1.7  PHOS  --  3.1   GFR: Estimated Creatinine Clearance: 59.7 mL/min (by C-G formula based on SCr of 1.03 mg/dL). Liver Function Tests: Recent Labs  Lab 06/17/23 1847  AST 13*  ALT 14  ALKPHOS 43  BILITOT 1.2*  PROT 6.5  ALBUMIN 2.4*   No results for input(s): "LIPASE", "AMYLASE" in the last 168 hours. No results for input(s): "AMMONIA" in the last 168 hours. Coagulation Profile: No results for input(s): "INR", "PROTIME" in the last 168 hours. Cardiac Enzymes: No results for input(s): "CKTOTAL", "CKMB", "CKMBINDEX", "TROPONINI" in the last 168 hours. BNP (last 3 results) No results for input(s): "PROBNP" in the last 8760 hours. HbA1C: No results for input(s): "HGBA1C" in the last 72 hours. CBG: Recent Labs  Lab 06/18/23 1614 06/18/23 2026 06/19/23 0358 06/19/23 0754 06/19/23 1111  GLUCAP 114* 142* 91 76 71   Lipid Profile: No results for input(s): "CHOL", "HDL", "LDLCALC", "TRIG", "CHOLHDL", "LDLDIRECT" in the last 72 hours. Thyroid Function Tests: No results for input(s): "TSH", "T4TOTAL", "FREET4", "T3FREE", "THYROIDAB" in the last 72 hours. Anemia Panel: No results for input(s): "VITAMINB12", "FOLATE", "FERRITIN", "TIBC", "IRON", "RETICCTPCT" in the last 72 hours. Sepsis Labs: Recent Labs  Lab 06/17/23 1903  LATICACIDVEN 1.4    Recent Results (from the past 240 hour(s))  Blood  culture (routine x 2)     Status: None (Preliminary result)   Collection Time: 06/17/23  6:47 PM   Specimen: BLOOD  Result Value Ref Range Status   Specimen Description BLOOD BLOOD LEFT HAND  Final   Special Requests   Final    BOTTLES DRAWN AEROBIC AND ANAEROBIC Blood Culture results may not be optimal due to an inadequate volume of blood received in culture bottles   Culture   Final    NO GROWTH 2 DAYS Performed at [facility], [address], [place], [place] [address]    Report Status PENDING  Incomplete  Blood culture (routine x 2)     Status: None (Preliminary result)   Collection Time: 06/17/23  6:58 PM   Specimen: BLOOD  Result Value Ref Range Status   Specimen Description BLOOD RIGHT ANTECUBITAL  Final   Special Requests   Final    BOTTLES DRAWN AEROBIC AND ANAEROBIC Blood Culture results may not be optimal due to an inadequate volume of blood received in culture bottles   Culture   Final    NO GROWTH 2 DAYS Performed at [facility], [address], [place], [place] [address]    Report Status PENDING  Incomplete  MRSA Next Gen by PCR, Nasal     Status: Abnormal   Collection Time: 06/17/23  7:51 PM   Specimen: Nasal  Mucosa; Nasal Swab  Result Value Ref Range Status   MRSA by PCR Next Gen DETECTED (A) NOT DETECTED Final    Comment: RESULT CALLED TO, READ BACK BY AND VERIFIED WITH: C COBB,RN@2202  06/17/23 MK (NOTE) The GeneXpert MRSA Assay (FDA approved for NASAL specimens only), is one component of a comprehensive MRSA colonization surveillance program. It is not intended to diagnose MRSA infection nor to guide or monitor treatment for MRSA infections. Test performance is not FDA approved in patients less than 16 years old. Performed at [facility], [address], [place], [place] [address]   Respiratory (~20 pathogens) panel by PCR     Status: None   Collection Time: 06/17/23  9:58 PM   Specimen: Sputum; Respiratory  Result Value Ref Range  Status   Adenovirus NOT DETECTED NOT DETECTED Final   Coronavirus 229E NOT DETECTED NOT DETECTED Final    Comment: (NOTE) The Coronavirus on the Respiratory Panel, DOES NOT test for the novel  Coronavirus (2019 nCoV)    Coronavirus HKU1 NOT DETECTED NOT DETECTED Final   Coronavirus NL63 NOT DETECTED NOT DETECTED Final   Coronavirus OC43 NOT DETECTED NOT DETECTED Final   Metapneumovirus NOT DETECTED NOT DETECTED Final   Rhinovirus / Enterovirus NOT DETECTED NOT DETECTED Final   Influenza A NOT DETECTED NOT DETECTED Final   Influenza B NOT DETECTED NOT DETECTED Final   Parainfluenza Virus 1 NOT DETECTED NOT DETECTED Final   Parainfluenza Virus 2 NOT DETECTED NOT DETECTED Final   Parainfluenza Virus 3 NOT DETECTED NOT DETECTED Final   Parainfluenza Virus 4 NOT DETECTED NOT DETECTED Final   Respiratory Syncytial Virus NOT DETECTED NOT DETECTED Final   Bordetella pertussis NOT DETECTED NOT DETECTED Final   Bordetella Parapertussis NOT DETECTED NOT DETECTED Final   Chlamydophila pneumoniae NOT DETECTED NOT DETECTED Final   Mycoplasma pneumoniae NOT DETECTED NOT DETECTED Final    Comment: Performed at [facility], [address] [place], [place] [address]         Radiology Studies: CT CHEST WO CONTRAST  Result Date: 06/17/2023 CLINICAL DATA:  Cough. Failed inferior treatment for multifocal pneumonia. 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 EXAM: CT CHEST WITHOUT CONTRAST TECHNIQUE: Multidetector CT imaging of the chest was performed following the standard protocol without IV contrast. RADIATION DOSE REDUCTION: This exam was performed according to the departmental dose-optimization program which includes automated exposure control, adjustment of the mA and/or kV according to patient size and/or use of iterative reconstruction technique. COMPARISON:  09/18/2022 FINDINGS: Cardiovascular: Heart is mildly enlarged. Scattered coronary artery and aortic atherosclerosis. Mediastinum/Nodes:  No mediastinal, hilar, or axillary adenopathy. Trachea and esophagus are unremarkable. Thyroid unremarkable. Lungs/Pleura: Biapical scarring. Peribronchovascular nodularity again noted throughout the lungs, most pronounced in the lower lobes where there is also bronchiectasis and mucous plugging, right greater than left. Findings are stable since prior study. No acute areas of consolidation. Trace right pleural effusion, new since prior study. Upper Abdomen: No acute findings Musculoskeletal: Chest wall soft tissues are unremarkable. No acute bony abnormality. IMPRESSION: Bronchiectasis with mucoid impaction and extensive peribronchovascular nodularity again noted in similar to prior study. Compatible with chronic MAC. Trace right pleural effusion. Coronary artery disease. Aortic Atherosclerosis (ICD10-I70.0). Electronically Signed   By: Charlett Nose M.D.   On: 06/17/2023 21:32   DG Chest 2 View  Result Date: 06/17/2023 CLINICAL DATA:  Confusion, weakness, recent pneumonia EXAM: CHEST - 2 VIEW COMPARISON:  04/26/2023 FINDINGS: Multifocal patchy opacities, right lower lobe predominant, suggesting  multifocal pneumonia. Small right pleural effusion. No pneumothorax. The heart is normal in size. Visualized osseous structures are within normal limits. IMPRESSION: Multifocal pneumonia, right lower lobe predominant. Small right pleural effusion. Electronically Signed   By: Charline Bills M.D.   On: 06/17/2023 17:13        Scheduled Meds:  budesonide (PULMICORT) nebulizer solution  0.25 mg Nebulization BID   Chlorhexidine Gluconate Cloth  6 each Topical Q0600   gabapentin  100 mg Oral QHS   guaiFENesin  600 mg Oral BID   insulin aspart  0-6 Units Subcutaneous TID WC   magnesium oxide  400 mg Oral BID   metoprolol succinate  25 mg Oral BID   mupirocin ointment  1 Application Nasal BID   pantoprazole  40 mg Oral Daily   potassium chloride  20 mEq Oral BID   sodium chloride flush  3 mL Intravenous Q12H    Continuous Infusions:  ceFEPime (MAXIPIME) IV Stopped (06/19/23 0626)   vancomycin 750 mg (06/19/23 0821)     LOS: 2 days    Time spent: 35 minutes    Dorcas Carrow, MD [facility]

## 2023-06-19 NOTE — Anesthesia Preprocedure Evaluation (Signed)
Anesthesia Evaluation  Patient identified by MRN, date of birth, ID band Patient awake    Reviewed: Allergy & Precautions, NPO status , Patient's Chart, lab work & pertinent test results, reviewed documented beta blocker date and time   Airway Mallampati: III  TM Distance: >3 FB     Dental  (+) Caps, Dental Advisory Given, Chipped,    Pulmonary pneumonia, resolved, COPD,  COPD inhaler, former smoker Bronchiectasis   breath sounds clear to auscultation + decreased breath sounds      Cardiovascular hypertension, Pt. on medications + dysrhythmias Atrial Fibrillation  Rhythm:Irregular Rate:Normal     Neuro/Psych Weakness right foot Diabetic neuropathy  Neuromuscular disease CVA, Residual Symptoms    GI/Hepatic Neg liver ROS,GERD  Medicated,,  Endo/Other  diabetes, Well Controlled, Type 2, Oral Hypoglycemic Agents    Renal/GU Renal diseaseHx/o nephrolithiasis   Hx/o bladder Ca    Musculoskeletal  (+) Arthritis , Osteoarthritis,    Abdominal   Peds  Hematology   Anesthesia Other Findings   Reproductive/Obstetrics                              Anesthesia Physical Anesthesia Plan  ASA: 3  Anesthesia Plan: MAC   Post-op Pain Management: Minimal or no pain anticipated   Induction: Intravenous  PONV Risk Score and Plan: 1 and Treatment may vary due to age or medical condition  Airway Management Planned:   Additional Equipment: None  Intra-op Plan:   Post-operative Plan:   Informed Consent: I have reviewed the patients History and Physical, chart, labs and discussed the procedure including the risks, benefits and alternatives for the proposed anesthesia with the patient or authorized representative who has indicated his/her understanding and acceptance.     Dental advisory given  Plan Discussed with: CRNA and Anesthesiologist  Anesthesia Plan Comments:          Anesthesia  Quick Evaluation

## 2023-06-19 NOTE — Anesthesia Procedure Notes (Addendum)
Procedure Name: Intubation Date/Time: 06/19/2023 2:25 PM  Performed by: Hilda Lias, CRNAPre-anesthesia Checklist: Patient identified, Emergency Drugs available, Suction available and Patient being monitored Patient Re-evaluated:Patient Re-evaluated prior to induction Oxygen Delivery Method: Circle System Utilized Preoxygenation: Pre-oxygenation with 100% oxygen Induction Type: IV induction Ventilation: Mask ventilation without difficulty Laryngoscope Size: Mac and 3 Grade View: Grade I Tube type: Oral Number of attempts: 1 Airway Equipment and Method: Stylet and Oral airway Placement Confirmation: ETT inserted through vocal cords under direct vision, positive ETCO2 and breath sounds checked- equal and bilateral Secured at: 24 cm Tube secured with: Tape Dental Injury: Teeth and Oropharynx as per pre-operative assessment

## 2023-06-19 NOTE — Anesthesia Postprocedure Evaluation (Signed)
Anesthesia Post Note  Patient: Matthew Hall  Procedure(s) Performed: VIDEO BRONCHOSCOPY WITHOUT FLUORO BRONCHIAL WASHINGS     Patient location during evaluation: PACU Anesthesia Type: General Level of consciousness: awake and alert Pain management: pain level controlled Vital Signs Assessment: post-procedure vital signs reviewed and stable Respiratory status: spontaneous breathing, nonlabored ventilation, respiratory function stable and patient connected to nasal cannula oxygen Cardiovascular status: stable and blood pressure returned to baseline Postop Assessment: no apparent nausea or vomiting Anesthetic complications: no   No notable events documented.  Last Vitals:  Vitals:   06/19/23 1835 06/19/23 1905  BP: (!) 110/58 121/75  Pulse: 98 (!) 105  Resp: (!) 32 (!) 25  Temp:    SpO2: 96% 97%    Last Pain:  Vitals:   06/19/23 1905  TempSrc:   PainSc: 0-No pain                 Ermina Oberman

## 2023-06-19 NOTE — TOC Progression Note (Signed)
Transition of Care [facility]) - Progression Note    Patient Details  Name: Matthew Hall MRN: [id number] Date of Birth: 05-26-1946  Transition of Care [facility]) CM/SW Contact  Marliss Coots, LCSW Phone Number: 06/19/2023, 11:47 AM  Clinical Narrative:     11:48 AM Per request of Palliative Care NP Marcelino Duster, CSW called patient's daughter, [name], to follow up on concerns she mentioned during goals of care meeting (APS/CPS, caregiver support, discharging home). [name] reiterated to CSW that she is concerned that patient is unable to continue to provide appropriate care for patient (feeding health food, toileting) and is interested in caregiver support veteran benefits. CSW offered to provide resources for this as discovered, as well as follow up with patient on SDOH needs (food, transportation. [name] accepted these offers. [name] informed CSW of possible involving APS due to health negligence as a caregiver (not eating healthy and difficulty assisting wife with toileting). CSW informed [name] that due to patient's current admission neither are in imminent danger to themselves or of others, therefore, APS could potentially not accept report at this time, but suggested daughter to do so if desired no or after discharge. [name] expressed understanding of this and interest of following up with this CSW on APS options, which CSW accepted. [name] informed CSW that patient and wife have partial custody of her brother's son (patient's grandson) and that there is an open CPS investigation followed by a Child psychotherapist.     Barriers to Discharge: Continued Medical Work up, Conservator, museum/gallery and Services In-house Referral: Clinical Social Work     Living arrangements for the past 2 months: Single Family Home                                       Social Determinants of Health (SDOH) Interventions SDOH Screenings   Food Insecurity: Food Insecurity Present  (06/18/2023)  Housing: Low Risk  (06/18/2023)  Recent Concern: Housing - High Risk (04/16/2023)  Transportation Needs: Unmet Transportation Needs (06/18/2023)  Utilities: Not At Risk (06/18/2023)  Depression (PHQ2-9): Low Risk  (05/04/2019)  Tobacco Use: Medium Risk (06/17/2023)    Readmission Risk Interventions     No data to display

## 2023-06-19 NOTE — Op Note (Signed)
Video Bronchoscopy Procedure Note  Date of Operation: 06/19/2023  Pre-op Diagnosis: bronchiectasis, tree-in-bud opacities  Post-op Diagnosis: Same  Surgeon: Steffanie Dunn  Assistants: none  Anesthesia: general anesthesia  Meds Given: per anesthesia records  Operation: Flexible video fiberoptic bronchoscopy and BAL  Estimated Blood Loss: <5cc  Complications: none noted  Indications and History: Matthew Hall is 77 y.o. with history of Afib, bronchiectasis, COPD. Admitted with cough, Ct with progression of bronchiectasis and tree in bud opacities concerning for atypical chronic infection.  Recommendation was to perform video fiberoptic bronchoscopy with BAL. The risks, benefits, complications, treatment options and expected outcomes were discussed with the patient.  The possibilities of pneumothorax, pneumonia, reaction to medication, pulmonary aspiration, perforation of a viscus, bleeding, failure to diagnose a condition and creating a complication requiring transfusion or operation were discussed with the patient who freely signed the consent.    Description of Procedure: The patient was seen in the Preoperative Area, was examined and was deemed appropriate to proceed.  The patient was taken to endoscopy, identified as Matthew Hall and the procedure verified as Flexible Video Fiberoptic Bronchoscopy.  A Time Out was held and the above information confirmed.   General anesthesia was initiated as indicated above. The video fiberoptic bronchoscope was introduced via the ETT and a general inspection was performed which showed normal distal trachea, normal main carina. The R sided airways were inspected and showed normal RUL, BI, RML and RLL. The L side was then inspected. The LLL, Lingular and LUL airways were normal.   Purulent secretions were present in the distal L main bronchus which were suctioned and sent for culture. BAL performed in medial RML with 20cc returned. The  patient initially desaturated and had Afib with RVR and hypotension, causing the procedure to be stopped. Hypotension and Afib treated per Anesthesia MAR. After his VS stabilized the scope was reintroduced to complete the procedure.  At the conclusion of the procedure he was stabile.   Samples: 1. Endobronchial washing from L main- routine, AFB, fungal cultures 2. BAL RML- routine, AFB, fungal cultures, cell count with diff  Plans:  We will review the  microbiology results with the patient when they become available.  Outpatient followup will be with Dr Vassie Loll.    Steffanie Dunn, DO 06/19/23 2:58 PM [facility]  For contact information, see Amion. If no response to pager, please call PCCM consult pager. After hours, 7PM- 7AM, please call Elink.

## 2023-06-19 NOTE — Interval H&P Note (Signed)
History and Physical Interval Note:  06/19/2023 1:53 PM  Becky Augusta  has presented today for surgery, with the diagnosis of Bronchiectasis, MAC.  The various methods of treatment have been discussed with the patient and family. After consideration of risks, benefits and other options for treatment, the patient has consented to  Procedure(s): VIDEO BRONCHOSCOPY WITHOUT FLUORO (N/A) as a surgical intervention.  The patient's history has been reviewed, patient examined, no change in status, stable for surgery.  I have reviewed the patient's chart and labs.  Questions were answered to the patient's satisfaction.    Seen & examined in pre-op. We reviewed the indication for bronchoscopy and he agrees with the plan. We reviewed risks, benefits to the procedure and he willingly consented to proceed. Planning for BAL for routine, AFB, & fungal cultures. No changes to his symptoms since yesterday.  BP 131/70   Pulse 88   Temp 98.1 F (36.7 C) (Temporal)   Resp 18   Ht 5\' 11"  (1.803 m)   Wt 72.6 kg   SpO2 93%   BMI 22.32 kg/m  Chronically ill appearing elderly man sitting up in bed in [place]/AT, eyes anicteric Faint rhales lower anterior. Breathing comfortably on RA.  S1S2, RRR Awake, alert, moving extremities. Answering questions appropriately.   Planning to proceed with bronch with BAL.    Steffanie Dunn

## 2023-06-20 DIAGNOSIS — J189 Pneumonia, unspecified organism: Secondary | ICD-10-CM | POA: Diagnosis not present

## 2023-06-20 DIAGNOSIS — J471 Bronchiectasis with (acute) exacerbation: Secondary | ICD-10-CM

## 2023-06-20 LAB — GLUCOSE, CAPILLARY
Glucose-Capillary: 82 mg/dL (ref 70–99)
Glucose-Capillary: 93 mg/dL (ref 70–99)
Glucose-Capillary: 135 mg/dL — ABNORMAL HIGH (ref 70–99)
Glucose-Capillary: 131 mg/dL — ABNORMAL HIGH (ref 70–99)

## 2023-06-20 LAB — VANCOMYCIN, PEAK: Vancomycin Pk: 51 ug/mL (ref 30–40)

## 2023-06-20 MED ORDER — APIXABAN 5 MG PO TABS
5.0000 mg | ORAL_TABLET | Freq: Two times a day (BID) | ORAL | Status: DC
  Administered 2023-06-20 – 2023-06-22 (×5): 5 mg via ORAL
  Filled 2023-06-20 (×5): qty 1

## 2023-06-20 MED ORDER — SODIUM CHLORIDE 3 % IN NEBU
4.0000 mL | INHALATION_SOLUTION | Freq: Two times a day (BID) | RESPIRATORY_TRACT | Status: DC
  Administered 2023-06-20 – 2023-06-22 (×4): 4 mL via RESPIRATORY_TRACT
  Filled 2023-06-20 (×6): qty 4

## 2023-06-20 NOTE — Progress Notes (Signed)
Mobility Specialist Progress Note:   06/20/23 1618  Mobility  Activity Ambulated with assistance in room  Level of Assistance Contact guard assist, steadying assist  Assistive Device Front wheel walker  Distance Ambulated (ft) 60 ft  Activity Response Tolerated well  Mobility Referral Yes  Mobility visit 1 Mobility  Mobility Specialist Start Time (ACUTE ONLY) 1505  Mobility Specialist Stop Time (ACUTE ONLY) 1525  Mobility Specialist Time Calculation (min) (ACUTE ONLY) 20 min   Pt received in bed, agreeable to mobility with encouragement. CG to stand and ambulate in room. Pt c/o RLE pain and mild fatigue, otherwise asx throughout. Pt left in bed with call bell in reach and all needs met.  Leory Plowman  Mobility Specialist Please contact via [employer] office at [phone]

## 2023-06-20 NOTE — Progress Notes (Signed)
NAME:  Matthew Hall, MRN:  [id number], DOB:  08-04-1945, LOS: 3 ADMISSION DATE:  06/17/2023 CONSULTATION DATE:  06/18/2023 REFERRING MD:  Jerral Ralph - TRH CHIEF COMPLAINT:  Cough, bronchiectasis, ?MAC   History of Present Illness:  77 year old man who presented to [facility] 12/8 for increasing weakness and SOB with cough x 1 month. PMHx significant for HTN, HLD, Afib (on Eliquis), ischemic CVA, lymphocytic vasculitis, COPD with bronchiectasis (follows with [facility] - Dr. Vassie Loll), T2DM, GERD, diverticulosis, bladder CA, anxiety. Recently admitted 10/3 - 10/9 for ischemic CVA and discharged to SNF.  After discharge, Matthew Hall developed worsening productive cough and SOB; he was treated in the outpatient setting with a course of Levaquin, unfortunately without improvement. Patient was then seen at [facility] 11/29 for SOB and had improvement with nebulizer treatments. Patient was seen again for similar symptoms on 12/4 ([place]) with workup demonstrating RLL PNA (treated with doxycycline). Symptoms remained persistent with worsening weakness and patient presented to [facility] for further care. Broad-spectrum cefepime/vanc initiated.  He has been worked up for bronchiectasis with tree-in-bud opacities as an outpatient.  Sputum AFB was attempted but could not be obtained.  He reports failure to thrive, 20 pound weight loss over the past year.  PCCM consulted for further recommendations.  Pertinent Medical History:   Past Medical History:  Diagnosis Date   Anxiety    Aortic atherosclerosis (HCC)    Atrial fibrillation (HCC)    Basal cell carcinoma    Bladder cancer [facility])    Bladder tumor    Colon polyps    COPD (chronic obstructive pulmonary disease) (HCC)    Cutaneous vasculitis    Diabetic peripheral neuropathy (HCC) 12/27/2020   Diverticulosis    Essential hypertension    GERD (gastroesophageal reflux disease)    History of pneumonia    Nephrolithiasis    Osteoarthritis    Significant  Hospital Events: Including procedures, antibiotic start and stop dates in addition to other pertinent events   12/8 - Admitted to [facility] under [facility] service. 12/9 - Pulmonary consult 12/10 bronch with BAL RML  Interim History / Subjective:  Matthew Hall feels like he can take deeper breaths today and feels like he has less mucus in his chest. He denies new complaints.   Objective:  Blood pressure 112/72, pulse 86, temperature 98.6 F (37 C), temperature source Oral, resp. rate 19, height 5\' 11"  (1.803 m), weight 71.2 kg, SpO2 99%.        Intake/Output Summary (Last 24 hours) at 06/20/2023 1147 Last data filed at 06/20/2023 0951 Gross per 24 hour  Intake 1040 ml  Output 580 ml  Net 460 ml   Filed Weights   06/17/23 1626 06/19/23 0400 06/20/23 0500  Weight: 68 kg 72.6 kg 71.2 kg   Physical Examination: General: elderly man lying in bed in NAD HEENT: [name], eyes anicteric Neuro: awake, alert, moving all extremities CV: S1S2, mild tachycardia, irreg rhythm  PULM: diffuse rhonchi, no wheezing. Speaking in full sentences, breathing comfortably on RA.  GI: soft, NT Extremities: no edema or cyanosis Skin: no diffuse rashes, warm & dry  AFB cutlure L: pending Fungal culture L : pending Routine culture L: no WBC, no organisms Fungal culture R pending AFB culture R: pending Routine culture R: rare WBC Cell count with diff from R: 1138, 95% PMNs   Resolved Hospital Problem List:    Assessment & Plan:  COPD with bronchiectasis- concern for chronic pulmonary infection such as NTM, less likely fungal.  20  pound weight loss with failure to thrive  - Follow bronch labs-- multiple cultures that will likely be pending for several weeks. Anticipate they will not be revealing this admission since AFB and fungal cultures are not processed in house. -con't following routine culture -added airway clearance therapy regimen-- recommend discharge with flutter and BID hypertonic saline  nebs -con't empiric antibiotics   Best Practice: (right click and "Reselect all SmartList Selections" daily)   Per Primary Team   Signature:  Steffanie Dunn, DO 06/20/23 4:32 PM [facility]  For contact information, see Amion. If no response to pager, please call PCCM consult pager. After hours, 7PM- 7AM, please call Elink.

## 2023-06-20 NOTE — Evaluation (Signed)
Occupational Therapy Evaluation Patient Details Name: Matthew Hall MRN: [id number] DOB: 1945-11-23 Today's Date: 06/20/2023   History of Present Illness Pt is 77 year old presented to [facility] on  06/17/23 for multifocal PNA and sepsis. PMH - recent CVA's, rt foot drop, atrial fibrillation, COPD, hypertension, vasculitis, diabetes, diabetic neuropathy, bladder cancer.   Clinical Impression   Pt c/o SOB and B foot pain, constant 2/10. Pt lives at home with wife, he is his wife's caregiver, Pt's wife currently going to SNF for broken ankle for 2nd time. PLOF independent, no AD for ambulation. Pt currently presents with significant SOB, drops to 86 briefly during activities, labored breathing, quickly recovers upon sitting/resting. Pt close to baseline for ADLs, set up/CGA, but due to SOB and O2 desat, Pt requires increased effort and support of RW. Pt does not have his R foot lifter here, making ambulation slightly more difficult. Pt would benefit from [facility] follow up, will be seen acutely during stay.        If plan is discharge home, recommend the following: A little help with walking and/or transfers;A little help with bathing/dressing/bathroom;Assistance with cooking/housework;Assist for transportation;Help with stairs or ramp for entrance    Functional Status Assessment  Patient has had a recent decline in their functional status and demonstrates the ability to make significant improvements in function in a reasonable and predictable amount of time.  Equipment Recommendations  None recommended by OT    Recommendations for Other Services       Precautions / Restrictions Precautions Precautions: Other (comment) Precaution Comments: decr activity tolerance Required Braces or Orthoses: Other Brace Other Brace: Has a toe-lifter for R shoe (perhaps dtr can bring it in) Restrictions Weight Bearing Restrictions: No      Mobility Bed Mobility Overal bed mobility: Modified Independent                   Transfers Overall transfer level: Needs assistance Equipment used: Rolling walker (2 wheels) Transfers: Sit to/from Stand, Bed to chair/wheelchair/BSC Sit to Stand: Contact guard assist     Step pivot transfers: Contact guard assist     General transfer comment: CGA using RW      Balance Overall balance assessment: Needs assistance Sitting-balance support: No upper extremity supported, Feet supported Sitting balance-Leahy Scale: Good Sitting balance - Comments: EOB ADLs   Standing balance support: No upper extremity supported, During functional activity Standing balance-Leahy Scale: Fair Standing balance comment: able to stand unsupported performing ADLs, RW for ambulation                           ADL either performed or assessed with clinical judgement   ADL Overall ADL's : Needs assistance/impaired Eating/Feeding: Independent   Grooming: Contact guard assist;Standing   Upper Body Bathing: Set up;Sitting   Lower Body Bathing: Set up;Sitting/lateral leans   Upper Body Dressing : Set up;Sitting   Lower Body Dressing: Set up;Sit to/from stand   Toilet Transfer: Contact guard assist   Toileting- Clothing Manipulation and Hygiene: Modified independent   Tub/ Shower Transfer: Contact guard assist   Functional mobility during ADLs: Contact guard assist General ADL Comments: Pt close to baseline, SOB and fatigue limit independence. PLOF ambulates without AD, needs RW currently, increased rest breaks.     Vision Baseline Vision/History: 0 No visual deficits Ability to See in Adequate Light: 0 Adequate Patient Visual Report: No change from baseline       Perception  Praxis         Pertinent Vitals/Pain Pain Assessment Pain Assessment: Faces Faces Pain Scale: Hurts a little bit Pain Location: B feet Pain Descriptors / Indicators: Aching, Burning, Constant Pain Intervention(s): Monitored during session      Extremity/Trunk Assessment Upper Extremity Assessment Upper Extremity Assessment: Overall WFL for tasks assessed           Communication Communication Communication: No apparent difficulties   Cognition Arousal: Alert Behavior During Therapy: WFL for tasks assessed/performed Overall Cognitive Status: Within Functional Limits for tasks assessed                                       General Comments       Exercises     Shoulder Instructions      Home Living Family/patient expects to be discharged to:: Private residence Living Arrangements: Spouse/significant other Available Help at Discharge: Family;Available PRN/intermittently Type of Home: House Home Access: Stairs to enter Entrance Stairs-Number of Steps: 1   Home Layout: Two level;Able to live on main level with bedroom/bathroom Alternate Level Stairs-Number of Steps: flight   Bathroom Shower/Tub: Chief Strategy Officer: Standard Bathroom Accessibility: Yes How Accessible: Accessible via walker Home Equipment: Tub bench;Wheelchair Financial trader (4 wheels);Rolling Walker (2 wheels);Hand held shower head;Adaptive equipment Adaptive Equipment: Reacher Additional Comments: Pt lives with wife, who is currently in hospital/SNF for broken ankle      Prior Functioning/Environment Prior Level of Function : Independent/Modified Independent             Mobility Comments: Primary caretaker for wife who pt states is "bedridden and disabled". He was assisting with transfers, bathing, dressing, toileting, and was doing all of the cooking/cleaning at home; Now with difficulty doing this since the CVA          OT Problem List: Decreased strength;Decreased activity tolerance;Impaired balance (sitting and/or standing);Pain      OT Treatment/Interventions: Self-care/ADL training;Therapeutic exercise;Energy conservation;DME and/or AE instruction;Therapeutic activities;Balance training     OT Goals(Current goals can be found in the care plan section) Acute Rehab OT Goals Patient Stated Goal: to return home OT Goal Formulation: With patient Time For Goal Achievement: 07/04/23 Potential to Achieve Goals: Good  OT Frequency: Min 1X/week    Co-evaluation              AM-PAC OT "6 Clicks" Daily Activity     Outcome Measure Help from another person eating meals?: None Help from another person taking care of personal grooming?: A Little Help from another person toileting, which includes using toliet, bedpan, or urinal?: A Little Help from another person bathing (including washing, rinsing, drying)?: A Little Help from another person to put on and taking off regular upper body clothing?: A Little Help from another person to put on and taking off regular lower body clothing?: A Little 6 Click Score: 19   End of Session Equipment Utilized During Treatment: Gait belt;Rolling walker (2 wheels) Nurse Communication: Mobility status  Activity Tolerance: Patient tolerated treatment well Patient left: in bed;with call bell/phone within reach  OT Visit Diagnosis: Unsteadiness on feet (R26.81);Other abnormalities of gait and mobility (R26.89);Muscle weakness (generalized) (M62.81);Pain Pain - part of body: Ankle and joints of foot                Time: 1031-1100 OT Time Calculation (min): 29 min Charges:  OT General Charges $OT Visit: 1  Visit OT Evaluation $OT Eval Low Complexity: 1 Low OT Treatments $Self Care/Home Management : 8-22 mins  [facility], OTR/L   Alexis Goodell 06/20/2023, 11:03 AM

## 2023-06-20 NOTE — Plan of Care (Signed)
  Problem: Coping: Goal: Ability to adjust to condition or change in health will improve Outcome: Progressing   Problem: Fluid Volume: Goal: Ability to maintain a balanced intake and output will improve Outcome: Progressing   Problem: Metabolic: Goal: Ability to maintain appropriate glucose levels will improve Outcome: Progressing   Problem: Nutritional: Goal: Maintenance of adequate nutrition will improve Outcome: Progressing   Problem: Nutritional: Goal: Progress toward achieving an optimal weight will improve Outcome: Progressing   Problem: Skin Integrity: Goal: Risk for impaired skin integrity will decrease Outcome: Progressing

## 2023-06-20 NOTE — TOC Initial Note (Signed)
Transition of Care (TOC) - Initial/Assessment Note  Donn Pierini RN, BSN Transitions of Care Unit 4E- RN Case Manager See Treatment Team for direct phone #   Patient Details  Name: Matthew Hall MRN: [id number] Date of Birth: [birth year]  Transition of Care [facility]) CM/SW Contact:    Darrold Span, RN Phone Number: 06/20/2023, 2:25 PM  Clinical Narrative:                 Noted recommendations for [facility], in to speak with pt at bedside- per conversation pt voiced that he just recently finished [facility] services with [facility]- agreeable to using them again if needed. Pt states his wife is currently at [facility] ED with broken ankle- not sure what plan is for her- called and spoke with her over phone- wife voiced that she wants to go to [facility] but not sure that they are going to admit her and if not insurance may not cover SNF stay.   Pt very worried about wife today and plan for her- CM to follow up tomorrow, discussed with pt that they may need to hire extra help in the home- pt voiced understanding that insurance would not cover this type of assistance. Pt is also a veteran and discussed about potentially [facility] with the [facility] to see if he is eligible for services.   Pt will need orders for HHPT/OT- CM will refer to [facility] per pt request.   CM also received msg that daughter requesting a phone call- pt gave permission to call daughterMorrie Sheldon.  TC made and discussed pt's transition plan to return home w/ [facility]- Questions answered that [name] had about private duty and [place] services. [name] confirmed pt has rollator at home- no DME needs.   TOC to continue to follow.     Expected Discharge Plan: Home w Home Health Services Barriers to Discharge: Continued Medical Work up, Insurance Authorization   Patient Goals and CMS Choice Patient states their goals for this hospitalization and ongoing recovery are:: return home CMS Medicare.gov Compare Post Acute Care list  provided to:: Patient Choice offered to / list presented to : Patient [place] ownership interest in [facility]provided to:: Patient    Expected Discharge Plan and Services In-house Referral: Clinical Social Work Discharge Planning Services: CM Consult Post Acute Care Choice: Home Health Living arrangements for the past 2 months: Single Family Home                 DME Arranged: N/A DME Agency: NA                  Prior Living Arrangements/Services Living arrangements for the past 2 months: Single Family Home Lives with:: Self, Minor Children, Spouse (partial custody of grandson) Patient language and need for interpreter reviewed:: Yes Do you feel safe going back to the place where you live?: Yes      Need for Family Participation in Patient Care: Yes (Comment) Care giver support system in place?: Yes (comment) Current home services: DME (Rolling walker, rollator, wheelchair) Criminal Activity/Legal Involvement Pertinent to Current Situation/Hospitalization: No - Comment as needed  Activities of Daily Living   ADL Screening (condition at time of admission) Independently performs ADLs?: No Does the patient have a NEW difficulty with bathing/dressing/toileting/self-feeding that is expected to last >3 days?: Yes (Initiates electronic notice to provider for possible OT consult) Does the patient have a NEW difficulty with getting in/out of bed, walking, or climbing stairs that is  expected to last >3 days?: Yes (Initiates electronic notice to provider for possible PT consult) Does the patient have a NEW difficulty with communication that is expected to last >3 days?: No Is the patient deaf or have difficulty hearing?: No Does the patient have difficulty seeing, even when wearing glasses/contacts?: No Does the patient have difficulty concentrating, remembering, or making decisions?: Yes  Permission Sought/Granted Permission sought to share information with : Family  Supports Permission granted to share information with : Yes, Verbal Permission Granted  Share Information with NAME: Rena Marsolek     Permission granted to share info w Relationship: Daughter  Permission granted to share info w Contact Information: [phone]  Emotional Assessment Appearance:: Appears stated age Attitude/Demeanor/Rapport: Engaged Affect (typically observed): Accepting Orientation: : Oriented to Self, Oriented to Place, Oriented to  Time, Oriented to Situation Alcohol / Substance Use: Not Applicable Psych Involvement: No (comment)  Admission diagnosis:  COPD with acute exacerbation (HCC) [J44.1] Multifocal pneumonia [J18.9] Pneumonia due to infectious organism, unspecified laterality, unspecified part of lung [J18.9] Sepsis, due to unspecified organism, unspecified whether acute organ dysfunction present [facility]) [A41.9] Patient Active Problem List   Diagnosis Date Noted   Bronchiectasis with acute exacerbation (HCC) 06/19/2023   Pneumonia due to infectious organism 06/19/2023   COPD with acute exacerbation (HCC) 06/18/2023   Multifocal pneumonia 06/17/2023   Sepsis (HCC) 06/17/2023   Physical deconditioning 06/17/2023   Weakness of right foot 05/09/2023   Cerebrovascular accident (CVA) (HCC) 04/12/2023   Anxiety 04/12/2023   Diabetic peripheral neuropathy (HCC) 12/27/2020   Paresthesia 12/01/2020   Pneumonia due to COVID-19 virus 10/29/2020   Hypokalemia 10/29/2020   Healthcare maintenance 07/25/2019   Leucocytosis 08/11/2017   Elevated sed rate 05/21/2017   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) 03/01/2017   Vasculitis (HCC) 02/20/2017   Atrial fibrillation, chronic (HCC) 07/09/2016   BRONCHIECTASIS W/O ACUTE EXACERBATION 03/31/2008   Essential hypertension 09/13/2007   Allergic rhinitis 09/13/2007   EMPHYSEMA 09/13/2007   COPD exacerbation (HCC) 09/13/2007   PCP:  Alinda Deem, MD Pharmacy:   [phone] [place], [place] - [address] [place] [address] Phone: [phone] Fax: [phone]     Social Determinants of Health (SDOH) Social History: SDOH Screenings   Food Insecurity: Food Insecurity Present (06/18/2023)  Housing: Low Risk  (06/18/2023)  Recent Concern: Housing - High Risk (04/16/2023)  Transportation Needs: Unmet Transportation Needs (06/18/2023)  Utilities: Not At Risk (06/18/2023)  Depression (PHQ2-9): Low Risk  (05/04/2019)  Tobacco Use: Medium Risk (06/19/2023)   SDOH Interventions:     Readmission Risk Interventions     No data to display

## 2023-06-20 NOTE — Progress Notes (Signed)
Received a call from the patient's daughter, answered all the questions and she has requested for home health and want to talk with the case manager, message conveyed to CM via secure chat.

## 2023-06-20 NOTE — Progress Notes (Signed)
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  He was seen multiple times in the hospital within the last month.  Recent discharge to a skilled nursing facility 2 months ago on Eliquis for acute stroke.  Afebrile.  Recently treated with Levaquin and then doxycycline.  Chest x-ray with extensive peribronchovascular nodularity, nodular consolidation with bronchiectasis and mucoid impaction.  Cultures drawn and patient started on vancomycin and cefepime in the ER. MRSA swab positive.   12/10, underwent bronchoscopy and lavage.  Microscopy pending.  Subjective:  Patient seen and examined.  No overnight events.  He had short episode of transient increasing heart rate and transferred to telemetry after the procedure yesterday.  Has some dry cough.  Denies any complaints.  Afebrile.  No family at the bedside.   Assessment & Plan:   Multifocal pneumonia, underlying COPD and bronchiectasis.  Sepsis present on admission.  Presented with leukocytosis, low-grade temperature, tachypnea and tachycardia.  Suspected MAC.  No diagnosis yet. 1 month of progressive symptoms.  Failed treatment with Levaquin azithromycin doxycycline. Currently on cefepime and vancomycin.  Will continue today. AFB, fungal cultures pending. Chest physiotherapy, incentive spirometry, deep breathing exercises, sputum induction, mucolytic's and bronchodilators.  Use Xopenex.  Tachyarrhythmia with albuterol. Supplemental oxygen to keep saturations more than 90%. CT scan of the chest done that shows extensive  nodularity, mucoid impaction.  This is more or less comparable to previous.  Seen by pulmonary. Bronchoscopy with bronchoalveolar lavage 12/10.  Purulent secretions on the distal left main bronchus.  Hypokalemia: Replaced.  Adequate.  Chronic medical issues including Permanent A-fib, continuing metoprolol.  Resume Eliquis today. Ischemic stroke, on Eliquis.,  Statin intolerance Polyneuropathy, history of right foot drop.  Uses AFO for the right foot drop. Essential hypertension: Blood pressures stable.  Holding antihypertensives. Type 2 diabetes: Well-controlled.  Holding metformin.  SSI while inpatient. Anxiety: Takes alprazolam twice daily. History of bladder cancer: Stable as per patient.  DVT prophylaxis: SCDs apixaban (ELIQUIS) tablet 5 mg   Code Status: Full code.  Palliative care consulted. Family Communication: Unable to contact.  Contacted by the nurse. Disposition Plan: Status is: Inpatient Remains inpatient appropriate because: Significant infection, shortness of breath.  Inpatient procedures.     Consultants:  Pulmonary,  Palliative  Procedures:  None  Antimicrobials:  Vancomycin and cefepime 12/8---     Objective: Vitals:   06/20/23 0836 06/20/23 0907 06/20/23 1040 06/20/23 1132  BP: 127/78   112/72  Pulse: (!) 107  91 86  Resp: (!) 31  (!) 27 19  Temp:    98.6 F (37 C)  TempSrc:    Oral  SpO2: 99% 97% 98% 99%  Weight:      Height:        Intake/Output Summary (Last 24 hours) at 06/20/2023 1250 Last data filed at 06/20/2023 0951 Gross per 24 hour  Intake 1040 ml  Output 580 ml  Net 460 ml   Filed Weights   06/17/23 1626 06/19/23 0400 06/20/23 0500  Weight: 68 kg 72.6 kg 71.2 kg    Examination:  General: Looks fairly comfortable today.  Pleasant to interaction. Cardiovascular: S1-S2 normal.  Regular  rate rhythm. Respiratory: Bilateral poor air entry.  Poor inspiratory effort.  No added sounds. Gastrointestinal: Soft.  Nontender.  Bowel  sound present. Ext: No swelling or edema.  No cyanosis. Neuro: Alert awake and oriented.  Right foot drop. Musculoskeletal: No deformities.    Data Reviewed: I have personally reviewed following labs and imaging studies  CBC: Recent Labs  Lab 06/17/23 1627 06/18/23 0249  WBC 15.3* 12.7*  NEUTROABS  --  9.5*  HGB 12.9* 12.3*  HCT 41.6 39.1  MCV 96.7 97.3  PLT 323 301   Basic Metabolic Panel: Recent Labs  Lab 06/17/23 1627 06/18/23 0249  NA 136 138  K 3.6 3.3*  CL 101 103  CO2 24 26  GLUCOSE 124* 105*  BUN 13 12  CREATININE 0.95 1.03  CALCIUM 8.8* 8.6*  MG  --  1.7  PHOS  --  3.1   GFR: Estimated Creatinine Clearance: 58.6 mL/min (by C-G formula based on SCr of 1.03 mg/dL). Liver Function Tests: Recent Labs  Lab 06/17/23 1847  AST 13*  ALT 14  ALKPHOS 43  BILITOT 1.2*  PROT 6.5  ALBUMIN 2.4*   No results for input(s): "LIPASE", "AMYLASE" in the last 168 hours. No results for input(s): "AMMONIA" in the last 168 hours. Coagulation Profile: No results for input(s): "INR", "PROTIME" in the last 168 hours. Cardiac Enzymes: No results for input(s): "CKTOTAL", "CKMB", "CKMBINDEX", "TROPONINI" in the last 168 hours. BNP (last 3 results) No results for input(s): "PROBNP" in the last 8760 hours. HbA1C: No results for input(s): "HGBA1C" in the last 72 hours. CBG: Recent Labs  Lab 06/19/23 1629 06/19/23 1840 06/19/23 2109 06/20/23 0602 06/20/23 1140  GLUCAP 76 100* 117* 82 93   Lipid Profile: No results for input(s): "CHOL", "HDL", "LDLCALC", "TRIG", "CHOLHDL", "LDLDIRECT" in the last 72 hours. Thyroid Function Tests: No results for input(s): "TSH", "T4TOTAL", "FREET4", "T3FREE", "THYROIDAB" in the last 72 hours. Anemia Panel: No results for input(s): "VITAMINB12", "FOLATE", "FERRITIN", "TIBC", "IRON", "RETICCTPCT" in the last 72 hours. Sepsis Labs: Recent Labs  Lab 06/17/23 1903  LATICACIDVEN 1.4    Recent Results (from the past 240 hour(s))   Blood culture (routine x 2)     Status: None (Preliminary result)   Collection Time: 06/17/23  6:47 PM   Specimen: BLOOD  Result Value Ref Range Status   Specimen Description BLOOD BLOOD LEFT HAND  Final   Special Requests   Final    BOTTLES DRAWN AEROBIC AND ANAEROBIC Blood Culture results may not be optimal due to an inadequate volume of blood received in culture bottles   Culture   Final    NO GROWTH 3 DAYS Performed at [facility], [address], [place], [place] [address]    Report Status PENDING  Incomplete  Blood culture (routine x 2)     Status: None (Preliminary result)   Collection Time: 06/17/23  6:58 PM   Specimen: BLOOD  Result Value Ref Range Status   Specimen Description BLOOD RIGHT ANTECUBITAL  Final   Special Requests   Final    BOTTLES DRAWN AEROBIC AND ANAEROBIC Blood Culture results may not be optimal due to an inadequate volume of blood received in culture bottles   Culture   Final    NO GROWTH 3 DAYS Performed at [facility], [address], [place], [place] [address]    Report Status PENDING  Incomplete  MRSA Next Gen by PCR, Nasal     Status: Abnormal  Collection Time: 06/17/23  7:51 PM   Specimen: Nasal Mucosa; Nasal Swab  Result Value Ref Range Status   MRSA by PCR Next Gen DETECTED (A) NOT DETECTED Final    Comment: RESULT CALLED TO, READ BACK BY AND VERIFIED WITH: C COBB,RN@2202  06/17/23 MK (NOTE) The GeneXpert MRSA Assay (FDA approved for NASAL specimens only), is one component of a comprehensive MRSA colonization surveillance program. It is not intended to diagnose MRSA infection nor to guide or monitor treatment for MRSA infections. Test performance is not FDA approved in patients less than 12 years old. Performed at [facility], [address], [place], [place] [address]   Respiratory (~20 pathogens) panel by PCR     Status: None   Collection Time: 06/17/23  9:58 PM   Specimen: Sputum; Respiratory  Result Value Ref  Range Status   Adenovirus NOT DETECTED NOT DETECTED Final   Coronavirus 229E NOT DETECTED NOT DETECTED Final    Comment: (NOTE) The Coronavirus on the Respiratory Panel, DOES NOT test for the novel  Coronavirus (2019 nCoV)    Coronavirus HKU1 NOT DETECTED NOT DETECTED Final   Coronavirus NL63 NOT DETECTED NOT DETECTED Final   Coronavirus OC43 NOT DETECTED NOT DETECTED Final   Metapneumovirus NOT DETECTED NOT DETECTED Final   Rhinovirus / Enterovirus NOT DETECTED NOT DETECTED Final   Influenza A NOT DETECTED NOT DETECTED Final   Influenza B NOT DETECTED NOT DETECTED Final   Parainfluenza Virus 1 NOT DETECTED NOT DETECTED Final   Parainfluenza Virus 2 NOT DETECTED NOT DETECTED Final   Parainfluenza Virus 3 NOT DETECTED NOT DETECTED Final   Parainfluenza Virus 4 NOT DETECTED NOT DETECTED Final   Respiratory Syncytial Virus NOT DETECTED NOT DETECTED Final   Bordetella pertussis NOT DETECTED NOT DETECTED Final   Bordetella Parapertussis NOT DETECTED NOT DETECTED Final   Chlamydophila pneumoniae NOT DETECTED NOT DETECTED Final   Mycoplasma pneumoniae NOT DETECTED NOT DETECTED Final    Comment: Performed at [facility], [address], [place], [place] [address]  Culture, BAL-quantitative w Gram Stain     Status: None (Preliminary result)   Collection Time: 06/19/23  2:45 PM   Specimen: Bronchial Washing, Left; Respiratory  Result Value Ref Range Status   Specimen Description BRONCHIAL ALVEOLAR LAVAGE  Final   Special Requests LEFT MAINSTEM  Final   Gram Stain NO WBC SEEN NO ORGANISMS SEEN   Final   Culture   Final    TOO YOUNG TO READ Performed at [facility], [address], [place], [place] [address]    Report Status PENDING  Incomplete  Culture, BAL-quantitative w Gram Stain     Status: None (Preliminary result)   Collection Time: 06/19/23  2:50 PM   Specimen: Bronchial Alveolar Lavage; Respiratory  Result Value Ref Range Status   Specimen Description BRONCHIAL  ALVEOLAR LAVAGE  Final   Special Requests RIGHT MIDDLE LOBE  Final   Gram Stain   Final    RARE WBC PRESENT,BOTH PMN AND MONONUCLEAR NO ORGANISMS SEEN    Culture   Final    TOO YOUNG TO READ Performed at [facility], [address], [place], [place] [address]    Report Status PENDING  Incomplete         Radiology Studies: No results found.      Scheduled Meds:  apixaban  5 mg Oral BID   budesonide (PULMICORT) nebulizer solution  0.25 mg Nebulization BID   Chlorhexidine Gluconate  Cloth  6 each Topical Q0600   gabapentin  100 mg Oral QHS   guaiFENesin  600 mg Oral BID   insulin aspart  0-6 Units Subcutaneous TID WC   magnesium oxide  400 mg Oral BID   metoprolol succinate  25 mg Oral BID   mupirocin ointment  1 Application Nasal BID   pantoprazole  40 mg Oral Daily   sodium chloride flush  3 mL Intravenous Q12H   sodium chloride HYPERTONIC  4 mL Nebulization BID   Continuous Infusions:  ceFEPime (MAXIPIME) IV Stopped (06/20/23 0500)   vancomycin 750 mg (06/20/23 0951)     LOS: 3 days    Time spent: 35 minutes    Dorcas Carrow, MD [facility]

## 2023-06-20 NOTE — Care Management Important Message (Signed)
Important Message  Patient Details  Name: Matthew Hall MRN: [id number] Date of Birth: Sep 11, 1945   Important Message Given:  Yes - Medicare IM     Sherilyn Banker 06/20/2023, 2:21 PM

## 2023-06-20 NOTE — Progress Notes (Signed)
PT Cancellation Note  Patient Details Name: Matthew Hall MRN: [id number] DOB: [birth year]   Cancelled Treatment:    Reason Eval/Treat Not Completed: Patient declined. Pt reports he worked with OT earlier and he amb in room for the first time in days and said he had gotten really weak. Explained importance of continued activity to regain strength. Pt continued to decline.    Angelina Ok [facility] 06/20/2023, 1:48 PM [name] PT Acute [employer] [phone]

## 2023-06-21 ENCOUNTER — Encounter (HOSPITAL_COMMUNITY): Payer: Self-pay | Admitting: Critical Care Medicine

## 2023-06-21 ENCOUNTER — Telehealth: Payer: Self-pay | Admitting: Critical Care Medicine

## 2023-06-21 DIAGNOSIS — J189 Pneumonia, unspecified organism: Secondary | ICD-10-CM | POA: Diagnosis not present

## 2023-06-21 LAB — GLUCOSE, CAPILLARY
Glucose-Capillary: 97 mg/dL (ref 70–99)
Glucose-Capillary: 127 mg/dL — ABNORMAL HIGH (ref 70–99)
Glucose-Capillary: 116 mg/dL — ABNORMAL HIGH (ref 70–99)
Glucose-Capillary: 124 mg/dL — ABNORMAL HIGH (ref 70–99)

## 2023-06-21 LAB — BASIC METABOLIC PANEL
Anion gap: 7 (ref 5–15)
BUN: 9 mg/dL (ref 8–23)
CO2: 25 mmol/L (ref 22–32)
Calcium: 8.1 mg/dL — ABNORMAL LOW (ref 8.9–10.3)
Chloride: 102 mmol/L (ref 98–111)
Creatinine, Ser: 0.88 mg/dL (ref 0.61–1.24)
GFR, Estimated: >60 mL/min (ref >60)
Glucose, Bld: 108 mg/dL — ABNORMAL HIGH (ref 70–99)
Potassium: 3 mmol/L — ABNORMAL LOW (ref 3.5–5.1)
Sodium: 134 mmol/L — ABNORMAL LOW (ref 135–145)

## 2023-06-21 LAB — ACID FAST SMEAR (AFB, MYCOBACTERIA): Acid Fast Smear: NEGATIVE

## 2023-06-21 LAB — VANCOMYCIN, RANDOM: Vancomycin Rm: 17 ug/mL

## 2023-06-21 MED ORDER — MINOCYCLINE HCL 50 MG PO CAPS
200.0000 mg | ORAL_CAPSULE | Freq: Two times a day (BID) | ORAL | Status: DC
  Administered 2023-06-21 – 2023-06-22 (×2): 200 mg via ORAL
  Filled 2023-06-21: qty 4
  Filled 2023-06-21: qty 2
  Filled 2023-06-21 (×2): qty 4

## 2023-06-21 MED ORDER — POTASSIUM CHLORIDE CRYS ER 10 MEQ PO TBCR
40.0000 meq | EXTENDED_RELEASE_TABLET | Freq: Two times a day (BID) | ORAL | Status: DC
  Administered 2023-06-21 – 2023-06-22 (×3): 40 meq via ORAL
  Filled 2023-06-21: qty 4
  Filled 2023-06-21: qty 2
  Filled 2023-06-21 (×2): qty 4

## 2023-06-21 MED ORDER — DOXYCYCLINE HYCLATE 100 MG PO TABS
100.0000 mg | ORAL_TABLET | Freq: Two times a day (BID) | ORAL | Status: DC
  Administered 2023-06-21: 100 mg via ORAL
  Filled 2023-06-21: qty 1

## 2023-06-21 NOTE — Progress Notes (Signed)
Mobility Specialist Progress Note:   06/21/23 1459  Mobility  Activity Ambulated with assistance to bathroom  Level of Assistance  (MinG)  Assistive Device Front wheel walker  Distance Ambulated (ft) 15 ft  Activity Response Tolerated well  Mobility Referral Yes  Mobility visit 1 Mobility  Mobility Specialist Start Time (ACUTE ONLY) 1452  Mobility Specialist Stop Time (ACUTE ONLY) 1458  Mobility Specialist Time Calculation (min) (ACUTE ONLY) 6 min   Pt received EOB, requesting assistance to BR. Pt ambulated to BR with MinG for safety. Asx throughout. Pt left in BR and reminded to pull call bell when ready.  Matthew Hall  Mobility Specialist Please contact via [employer] office at [phone]

## 2023-06-21 NOTE — Telephone Encounter (Signed)
OP pulmonology follow up requested.   Steffanie Dunn, DO 06/21/23 5:24 PM [facility]  For contact information, see Amion. If no response to pager, please call PCCM consult pager. After hours, 7PM- 7AM, please call Elink.

## 2023-06-21 NOTE — Plan of Care (Signed)
  Problem: Coping: Goal: Ability to adjust to condition or change in health will improve Outcome: Progressing   Problem: Fluid Volume: Goal: Ability to maintain a balanced intake and output will improve Outcome: Progressing   Problem: Health Behavior/Discharge Planning: Goal: Ability to identify and utilize available resources and services will improve Outcome: Progressing   Problem: Metabolic: Goal: Ability to maintain appropriate glucose levels will improve Outcome: Progressing   Problem: Skin Integrity: Goal: Risk for impaired skin integrity will decrease Outcome: Progressing

## 2023-06-21 NOTE — Progress Notes (Signed)
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  He was seen multiple times in the hospital within the last month.  Recent discharge to a skilled nursing facility 2 months ago on Eliquis for acute stroke.  Afebrile.  Recently treated with Levaquin and then doxycycline.  Chest x-ray with extensive peribronchovascular nodularity, nodular consolidation with bronchiectasis and mucoid impaction.  Cultures drawn and patient started on vancomycin and cefepime in the ER. MRSA swab positive.   12/10, underwent bronchoscopy and lavage.  Microscopy pending.  No positive cultures here.  Subjective:  Patient seen and examined.  Fatigued after mobilizing with PT.  Increasing cough following bronchoscopy.  Afebrile. Lives at home, his wife also has poor mobility.  Does not want to go to a SNF.  He thinks he can manage at home. Will discuss with pulm about long-term oral medications on discharge.   Assessment & Plan:   Multifocal pneumonia, underlying COPD and bronchiectasis.  Sepsis present on admission.  Presented with leukocytosis, low-grade temperature, tachypnea and tachycardia.  Presented with 1 month of progressive symptoms.  Failed treatment with Levaquin, azithromycin, doxycycline. Currently on cefepime and vancomycin.  Will continue today. AFB, fungal cultures pending. Chest physiotherapy, incentive spirometry, deep breathing exercises, sputum induction, mucolytic's and bronchodilators.  Use Xopenex.  Tachyarrhythmia with albuterol. Hypertonic saline nebulizer. Supplemental  oxygen to keep saturations more than 90%. CT scan of the chest done that shows extensive nodularity, mucoid impaction.   Bronchoscopy with bronchoalveolar lavage 12/10.  Purulent secretions on the distal left main bronchus.  Hypokalemia: Replace further today.  Chronic medical issues including Permanent A-fib, continuing metoprolol.  Back on Eliquis. Ischemic stroke, on Eliquis.,  Statin intolerance Polyneuropathy, history of right foot drop.  Uses AFO for the right foot drop. Essential hypertension: Blood pressures stable.  Holding antihypertensives. Type 2 diabetes: Well-controlled.  Holding metformin.  SSI while inpatient. Anxiety: Takes alprazolam twice daily. History of bladder cancer: Stable as per patient.  DVT prophylaxis: SCDs apixaban (ELIQUIS) tablet 5 mg   Code Status: Full code.  Palliative care consulted.   Family Communication: None today.  Patient's daughter [name] on 12/11. Disposition Plan: Status is: Inpatient Remains inpatient appropriate because: Significant infection, shortness of breath.  IV antibiotics.     Consultants:  Pulmonary,  Palliative  Procedures:  Bronchoscopy.  Antimicrobials:  Vancomycin and cefepime 12/8---     Objective: Vitals:   06/21/23 0410 06/21/23 0801 06/21/23 0810 06/21/23 0947  BP: 116/68 128/69 128/69   Pulse: 90 90 97   Resp: 20 20    Temp: 98.2 F (36.8 C) 98 F (36.7 C)    TempSrc: Oral Oral    SpO2: 96% 96%  98%  Weight:      Height:        Intake/Output Summary (Last 24 hours) at 06/21/2023 1121 Last data filed at 06/21/2023 1000 Gross per 24 hour  Intake 320 ml  Output 700 ml  Net -380 ml   Filed Weights   06/17/23 1626 06/19/23 0400 06/20/23 0500  Weight: 68 kg 72.6 kg 71.2 kg    Examination:  General: Slightly anxious.  Looks otherwise comfortable.  On  room air. Cardiovascular: S1-S2 normal.  Irregular irregular. Respiratory: Bilateral poor air entry.  Poor inspiratory effort.  Conducted upper  airway sounds. Gastrointestinal: Soft.  Nontender.  Bowel sound present. Ext: No swelling or edema.  No cyanosis. Neuro: Alert awake and oriented.  Right foot drop on brace. Musculoskeletal: No deformities.    Data Reviewed: I have personally reviewed following labs and imaging studies  CBC: Recent Labs  Lab 06/17/23 1627 06/18/23 0249  WBC 15.3* 12.7*  NEUTROABS  --  9.5*  HGB 12.9* 12.3*  HCT 41.6 39.1  MCV 96.7 97.3  PLT 323 301   Basic Metabolic Panel: Recent Labs  Lab 06/17/23 1627 06/18/23 0249 06/21/23 0248  NA 136 138 134*  K 3.6 3.3* 3.0*  CL 101 103 102  CO2 24 26 25   GLUCOSE 124* 105* 108*  BUN 13 12 9   CREATININE 0.95 1.03 0.88  CALCIUM 8.8* 8.6* 8.1*  MG  --  1.7  --   PHOS  --  3.1  --    GFR: Estimated Creatinine Clearance: 68.5 mL/min (by C-G formula based on SCr of 0.88 mg/dL). Liver Function Tests: Recent Labs  Lab 06/17/23 1847  AST 13*  ALT 14  ALKPHOS 43  BILITOT 1.2*  PROT 6.5  ALBUMIN 2.4*   No results for input(s): "LIPASE", "AMYLASE" in the last 168 hours. No results for input(s): "AMMONIA" in the last 168 hours. Coagulation Profile: No results for input(s): "INR", "PROTIME" in the last 168 hours. Cardiac Enzymes: No results for input(s): "CKTOTAL", "CKMB", "CKMBINDEX", "TROPONINI" in the last 168 hours. BNP (last 3 results) No results for input(s): "PROBNP" in the last 8760 hours. HbA1C: No results for input(s): "HGBA1C" in the last 72 hours. CBG: Recent Labs  Lab 06/20/23 0602 06/20/23 1140 06/20/23 1646 06/20/23 2117 06/21/23 0607  GLUCAP 82 93 135* 131* 97   Lipid Profile: No results for input(s): "CHOL", "HDL", "LDLCALC", "TRIG", "CHOLHDL", "LDLDIRECT" in the last 72 hours. Thyroid Function Tests: No results for input(s): "TSH", "T4TOTAL", "FREET4", "T3FREE", "THYROIDAB" in the last 72 hours. Anemia Panel: No results for input(s): "VITAMINB12", "FOLATE", "FERRITIN", "TIBC", "IRON", "RETICCTPCT" in the last 72  hours. Sepsis Labs: Recent Labs  Lab 06/17/23 1903  LATICACIDVEN 1.4    Recent Results (from the past 240 hours)  Blood culture (routine x 2)     Status: None (Preliminary result)   Collection Time: 06/17/23  6:47 PM   Specimen: BLOOD  Result Value Ref Range Status   Specimen Description BLOOD BLOOD LEFT HAND  Final   Special Requests   Final    BOTTLES DRAWN AEROBIC AND ANAEROBIC Blood Culture results may not be optimal due to an inadequate volume of blood received in culture bottles   Culture   Final    NO GROWTH 4 DAYS Performed at [facility], [address] [place], [place] [address]    Report Status PENDING  Incomplete  Blood culture (routine x 2)     Status: None (Preliminary result)   Collection Time: 06/17/23  6:58 PM   Specimen: BLOOD  Result Value Ref Range Status   Specimen Description BLOOD RIGHT ANTECUBITAL  Final   Special Requests   Final    BOTTLES DRAWN AEROBIC AND ANAEROBIC Blood Culture results may not be optimal due to an inadequate volume of blood received in culture bottles   Culture   Final    NO GROWTH 4 DAYS Performed at [facility], [address] [place],  [place] [address]    Report Status PENDING  Incomplete  MRSA Next Gen by PCR, Nasal     Status: Abnormal   Collection Time: 06/17/23  7:51 PM   Specimen: Nasal Mucosa; Nasal Swab  Result Value Ref Range Status   MRSA by PCR Next Gen DETECTED (A) NOT DETECTED Final    Comment: RESULT CALLED TO, READ BACK BY AND VERIFIED WITH: C COBB,RN@2202  06/17/23 MK (NOTE) The GeneXpert MRSA Assay (FDA approved for NASAL specimens only), is one component of a comprehensive MRSA colonization surveillance program. It is not intended to diagnose MRSA infection nor to guide or monitor treatment for MRSA infections. Test performance is not FDA approved in patients less than 51 years old. Performed at [facility], [address] [place], [place] [address]   Respiratory (~20 pathogens)  panel by PCR     Status: None   Collection Time: 06/17/23  9:58 PM   Specimen: Sputum; Respiratory  Result Value Ref Range Status   Adenovirus NOT DETECTED NOT DETECTED Final   Coronavirus 229E NOT DETECTED NOT DETECTED Final    Comment: (NOTE) The Coronavirus on the Respiratory Panel, DOES NOT test for the novel  Coronavirus (2019 nCoV)    Coronavirus HKU1 NOT DETECTED NOT DETECTED Final   Coronavirus NL63 NOT DETECTED NOT DETECTED Final   Coronavirus OC43 NOT DETECTED NOT DETECTED Final   Metapneumovirus NOT DETECTED NOT DETECTED Final   Rhinovirus / Enterovirus NOT DETECTED NOT DETECTED Final   Influenza A NOT DETECTED NOT DETECTED Final   Influenza B NOT DETECTED NOT DETECTED Final   Parainfluenza Virus 1 NOT DETECTED NOT DETECTED Final   Parainfluenza Virus 2 NOT DETECTED NOT DETECTED Final   Parainfluenza Virus 3 NOT DETECTED NOT DETECTED Final   Parainfluenza Virus 4 NOT DETECTED NOT DETECTED Final   Respiratory Syncytial Virus NOT DETECTED NOT DETECTED Final   Bordetella pertussis NOT DETECTED NOT DETECTED Final   Bordetella Parapertussis NOT DETECTED NOT DETECTED Final   Chlamydophila pneumoniae NOT DETECTED NOT DETECTED Final   Mycoplasma pneumoniae NOT DETECTED NOT DETECTED Final    Comment: Performed at [facility], [address], [place], [place] [address]  Culture, BAL-quantitative w Gram Stain     Status: None (Preliminary result)   Collection Time: 06/19/23  2:45 PM   Specimen: Bronchial Washing, Left; Respiratory  Result Value Ref Range Status   Specimen Description BRONCHIAL ALVEOLAR LAVAGE  Final   Special Requests LEFT MAINSTEM  Final   Gram Stain NO WBC SEEN NO ORGANISMS SEEN   Final   Culture   Final    CULTURE REINCUBATED FOR BETTER GROWTH Performed at [facility], [address], [place], [place] [address]    Report Status PENDING  Incomplete  Acid Fast Smear (AFB)     Status: None   Collection Time: 06/19/23  2:45 PM   Specimen:  Bronchial Washing, Left; Respiratory  Result Value Ref Range Status   AFB Specimen Processing Concentration  Final   Acid Fast Smear Negative  Final    Comment: (NOTE) Performed At: [facility] [address] [place], [place] [id number] Jolene Schimke MD XB:[id number]    Source (AFB) BRONCHIAL ALVEOLAR LAVAGE  Final    Comment: Performed at [facility], [address] [place], [place] [address]  Culture, BAL-quantitative w Gram Stain     Status: None (Preliminary result)   Collection Time: 06/19/23  2:50 PM   Specimen: Bronchial Alveolar Lavage;  Respiratory  Result Value Ref Range Status   Specimen Description BRONCHIAL ALVEOLAR LAVAGE  Final   Special Requests RIGHT MIDDLE LOBE  Final   Gram Stain   Final    RARE WBC PRESENT,BOTH PMN AND MONONUCLEAR NO ORGANISMS SEEN    Culture   Final    CULTURE REINCUBATED FOR BETTER GROWTH Performed at [facility], [address] [place], [place] [address]    Report Status PENDING  Incomplete         Radiology Studies: No results found.      Scheduled Meds:  apixaban  5 mg Oral BID   budesonide (PULMICORT) nebulizer solution  0.25 mg Nebulization BID   Chlorhexidine Gluconate Cloth  6 each Topical Q0600   gabapentin  100 mg Oral QHS   guaiFENesin  600 mg Oral BID   insulin aspart  0-6 Units Subcutaneous TID WC   magnesium oxide  400 mg Oral BID   metoprolol succinate  25 mg Oral BID   mupirocin ointment  1 Application Nasal BID   pantoprazole  40 mg Oral Daily   potassium chloride  40 mEq Oral BID   sodium chloride flush  3 mL Intravenous Q12H   sodium chloride HYPERTONIC  4 mL Nebulization BID   Continuous Infusions:  ceFEPime (MAXIPIME) IV Stopped (06/21/23 0603)   vancomycin Stopped (06/20/23 2047)     LOS: 4 days    Time spent: 35 minutes    Dorcas Carrow, MD [facility]

## 2023-06-21 NOTE — Progress Notes (Signed)
Physical Therapy Treatment Patient Details Name: Matthew Hall MRN: [id number] DOB: [birth year] Today's Date: 06/21/2023   History of Present Illness      PT Comments  Pt pleasant and agreeable to mobility. Pt completed bed mobility at modI, [facility] was locked at 30 degrees, pt likely would have no issues rising from flat bed, STS at supervision. Pt tolerated increased distance with ambulation with less restrictive AD. Pt reported not normally using AD at home and felt the RW did not move easily, requested to use rollator, pt showed good upright posture and was knowledgeable about use of brakes and seat. Min cueing for hand placement and set up with new AD.   SPO2% >95% RA    If plan is discharge home, recommend the following: Assistance with cooking/housework;Assist for transportation   Can travel by private Psychologist, clinical (4 wheels)    Recommendations for Other Services       Precautions / Restrictions Precautions Precautions: Fall Required Braces or Orthoses: Other Brace Other Brace: toe-lifter for R shoe Restrictions Weight Bearing Restrictions Per Provider Order: No     Mobility  Bed Mobility Overal bed mobility: Modified Independent             General bed mobility comments: incr time; HOB elevated    Transfers Overall transfer level: Needs assistance Equipment used: Rollator (4 wheels) Transfers: Sit to/from Stand Sit to Stand: Supervision           General transfer comment: Stood EOB stepped to rollator    Ambulation/Gait Ambulation/Gait assistance: Contact guard assist Gait Distance (Feet): 200 Feet Assistive device: Rollator (4 wheels) Gait Pattern/deviations: Step-through pattern, Decreased dorsiflexion - right       General Gait Details: Pt requested use of rollator as he did not like RW, pt shows swing through gait pattern, has increased R knee flexion during swing phase to clear R foot   Stairs              Wheelchair Mobility     Tilt Bed    Modified Rankin (Stroke Patients Only)       Balance Overall balance assessment: Needs assistance Sitting-balance support: No upper extremity supported, Feet supported Sitting balance-Leahy Scale: Good Sitting balance - Comments: donned shoes and toe lifter EOB   Standing balance support: No upper extremity supported, During functional activity, Bilateral upper extremity supported Standing balance-Leahy Scale: Fair Standing balance comment: use of rollator in standing, bouts of unsupported stance                            Cognition Arousal: Alert Behavior During Therapy: WFL for tasks assessed/performed Overall Cognitive Status: Within Functional Limits for tasks assessed                                          Exercises      General Comments        Pertinent Vitals/Pain Pain Assessment Pain Assessment: No/denies pain    Home Living                          Prior Function            PT Goals (current goals can now be found in the care plan section) Progress towards  PT goals: Progressing toward goals    Frequency    Min 1X/week      PT Plan      Co-evaluation              AM-PAC PT "6 Clicks" Mobility   Outcome Measure  Help needed turning from your back to your side while in a flat bed without using bedrails?: None Help needed moving from lying on your back to sitting on the side of a flat bed without using bedrails?: None Help needed moving to and from a bed to a chair (including a wheelchair)?: A Little Help needed standing up from a chair using your arms (e.g., wheelchair or bedside chair)?: A Little Help needed to walk in hospital room?: A Lot Help needed climbing 3-5 steps with a railing? : A Lot 6 Click Score: 18    End of Session Equipment Utilized During Treatment: Gait belt Activity Tolerance: Patient tolerated treatment well Patient  left: in chair;with call bell/phone within reach Nurse Communication: Mobility status PT Visit Diagnosis: Other abnormalities of gait and mobility (R26.89);Other (comment)     Time: 6578-4696 PT Time Calculation (min) (ACUTE ONLY): 34 min  Charges:    $Gait Training: 8-22 mins $Therapeutic Activity: 8-22 mins PT General Charges $$ ACUTE PT VISIT: 1 Visit                     Andrey Farmer. SPT Secure chat preferred    Darlin Drop 06/21/2023, 1:04 PM

## 2023-06-21 NOTE — Plan of Care (Signed)
  Problem: Fluid Volume: Goal: Ability to maintain a balanced intake and output will improve Outcome: Progressing   Problem: Coping: Goal: Ability to adjust to condition or change in health will improve Outcome: Progressing   Problem: Education: Goal: Ability to describe self-care measures that may prevent or decrease complications (Diabetes Survival Skills Education) will improve Outcome: Progressing Goal: Individualized Educational Video(s) Outcome: Progressing   Problem: Health Behavior/Discharge Planning: Goal: Ability to identify and utilize available resources and services will improve Outcome: Progressing   Problem: Nutritional: Goal: Maintenance of adequate nutrition will improve Outcome: Progressing   Problem: Nutritional: Goal: Progress toward achieving an optimal weight will improve Outcome: Progressing   Problem: Education: Goal: Knowledge of General Education information will improve Description: Including pain rating scale, medication(s)/side effects and non-pharmacologic comfort measures Outcome: Progressing   Problem: Clinical Measurements: Goal: Will remain free from infection Outcome: Progressing   Problem: Clinical Measurements: Goal: Diagnostic test results will improve Outcome: Progressing   Problem: Clinical Measurements: Goal: Ability to maintain clinical measurements within normal limits will improve Outcome: Progressing

## 2023-06-21 NOTE — Progress Notes (Signed)
Mobility Specialist Progress Note:   06/21/23 1442  Mobility  Activity Ambulated with assistance in hallway  Level of Assistance Contact guard assist, steadying assist  Assistive Device Four wheel walker  Distance Ambulated (ft) 200 ft  Activity Response Tolerated well  Mobility Referral Yes  Mobility visit 1 Mobility  Mobility Specialist Start Time (ACUTE ONLY) 1230  Mobility Specialist Stop Time (ACUTE ONLY) 1255  Mobility Specialist Time Calculation (min) (ACUTE ONLY) 25 min   Pt received in bed, agreeable to mobility. Standing rest break required during ambulation d/t slight SOB. Pursed lip breathing encouraged. VSS throughout. Pt denied any other complaint during session. Pt returned to bed with call bell in reach and all needs met.  Leory Plowman  Mobility Specialist Please contact via [employer] office at [phone]

## 2023-06-21 NOTE — Progress Notes (Addendum)
Cultures + stenotrophomonas bilaterally. Susceptibilities still pending.   AFB & fungal cultures pending Recommend continued airway clearance therapy regimen- flutter, hypertonic saline nebs BID> recommend con't at discharge. Agree with plan for minocycline x 2 weeks (recommend longer course with bronchiectasis)  Needs OP Pulmonology follow up; follows with Dr. Vassie Loll.   Steffanie Dunn, DO 06/21/23 5:23 PM [facility]  For contact information, see Amion. If no response to pager, please call PCCM consult pager. After hours, 7PM- 7AM, please call Elink.

## 2023-06-22 ENCOUNTER — Other Ambulatory Visit (HOSPITAL_COMMUNITY): Payer: Self-pay

## 2023-06-22 DIAGNOSIS — J189 Pneumonia, unspecified organism: Secondary | ICD-10-CM | POA: Diagnosis not present

## 2023-06-22 LAB — CULTURE, BLOOD (ROUTINE X 2)
Culture: NO GROWTH
Culture: NO GROWTH

## 2023-06-22 LAB — CULTURE, BAL-QUANTITATIVE W GRAM STAIN
Culture: 60000 — AB
Gram Stain: NONE SEEN

## 2023-06-22 LAB — ACID FAST SMEAR (AFB, MYCOBACTERIA): Acid Fast Smear: NEGATIVE

## 2023-06-22 LAB — GLUCOSE, CAPILLARY: Glucose-Capillary: 87 mg/dL (ref 70–99)

## 2023-06-22 MED ORDER — BUDESONIDE 0.25 MG/2ML IN SUSP
0.2500 mg | Freq: Two times a day (BID) | RESPIRATORY_TRACT | 12 refills | Status: DC
  Filled 2023-06-22: qty 60, 15d supply, fill #0

## 2023-06-22 MED ORDER — MINOCYCLINE HCL 100 MG PO CAPS
200.0000 mg | ORAL_CAPSULE | Freq: Two times a day (BID) | ORAL | 0 refills | Status: AC
  Filled 2023-06-22: qty 56, 14d supply, fill #0

## 2023-06-22 MED ORDER — BUDESONIDE 0.25 MG/2ML IN SUSP: 0.2500 mg | Freq: Two times a day (BID) | RESPIRATORY_TRACT | 12 refills | Status: DC

## 2023-06-22 MED ORDER — BENZONATATE 100 MG PO CAPS
200.0000 mg | ORAL_CAPSULE | Freq: Three times a day (TID) | ORAL | 0 refills | Status: DC | PRN
Start: 2023-06-22 — End: 2023-08-17
  Filled 2023-06-22: qty 30, 5d supply, fill #0

## 2023-06-22 MED ORDER — SODIUM CHLORIDE 3 % IN NEBU
4.0000 mL | INHALATION_SOLUTION | Freq: Two times a day (BID) | RESPIRATORY_TRACT | 12 refills | Status: DC
  Filled 2023-06-22: qty 750, 94d supply, fill #0

## 2023-06-22 MED ORDER — NEOMYCIN-POLYMYXIN-DEXAMETH 3.5-10000-0.1 OP OINT
1.0000 | TOPICAL_OINTMENT | Freq: Every day | OPHTHALMIC | 0 refills | Status: DC
  Filled 2023-06-22: qty 3.5, 3d supply, fill #0

## 2023-06-22 MED ORDER — MAGNESIUM OXIDE -MG SUPPLEMENT 400 (240 MG) MG PO TABS
400.0000 mg | ORAL_TABLET | Freq: Every day | ORAL | 0 refills | Status: AC
  Filled 2023-06-22: qty 30, 30d supply, fill #0

## 2023-06-22 MED ORDER — POTASSIUM CHLORIDE CRYS ER 20 MEQ PO TBCR
40.0000 meq | EXTENDED_RELEASE_TABLET | Freq: Every day | ORAL | 0 refills | Status: DC
  Filled 2023-06-22: qty 14, 7d supply, fill #0

## 2023-06-22 MED ORDER — GUAIFENESIN ER 600 MG PO TB12
600.0000 mg | ORAL_TABLET | Freq: Two times a day (BID) | ORAL | 0 refills | Status: DC
  Filled 2023-06-22: qty 60, 30d supply, fill #0

## 2023-06-22 NOTE — Discharge Summary (Signed)
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  He was seen multiple times in the hospital within the last month.  Recent discharge to a skilled nursing facility 2 months ago on Eliquis for acute stroke.  Afebrile.  Recently treated with Levaquin and then doxycycline.  Chest x-ray with extensive peribronchovascular nodularity, nodular consolidation with bronchiectasis and mucoid impaction.  Cultures drawn and patient started on vancomycin and cefepime in the ER. MRSA swab was positive.   12/10, underwent bronchoscopy and lavage.  Microscopy consistent with Stenotrophomonas maltophilia.  Clinically improving.  Discharging home with outpatient follow-up plans.   Assessment & Plan:   Multifocal pneumonia, underlying COPD and bronchiectasis.  Sepsis present on admission.  Presented with leukocytosis, low-grade temperature, tachypnea and tachycardia.   Presented with 1 month of progressive symptoms.  Failed treatment with Levaquin, azithromycin, doxycycline. Was treated with 3 days of vancomycin and cefepime. Bacterial cultures positive for stenotrophomonas from both lungs. AFB, fungal cultures  pending. Patient will be treated with minocycline 200 mg twice daily for 2 weeks. He will continue to do Chest physiotherapy, use incentive spirometry, deep breathing exercises, sputum induction, mucolytic's and bronchodilators.  Use Xopenex.   Hypertonic saline nebulizer. Currently on room air. Patient had purulent secretions on bronchoscopy.   Hypokalemia: Replaced and adequate.  Will send home with potassium and magnesium replacement.   Chronic medical issues including Permanent A-fib, continuing metoprolol.  Back on Eliquis.  Tolerating. Ischemic stroke, on Eliquis.,  Statin intolerance Polyneuropathy, history of right foot drop.  Uses AFO for the right foot drop. Essential hypertension: Blood pressures stable.  Can resume antihypertensives. Type 2 diabetes: Well-controlled.  Resume metformin. Anxiety: Takes alprazolam twice daily. History of bladder cancer: Stable as per patient.  Remains chronically sick.  Also seen by palliative care.  Full code.  Medically stabilized today.  He will continue ongoing follow-up at pulmonary office. Today he is able to go home with home health therapies.  He is going to his daughter's house.  Discharge Diagnoses:  Principal Problem:   Multifocal pneumonia Active Problems:   Sepsis (HCC)   Physical deconditioning   COPD with acute exacerbation (HCC)   Bronchiectasis with acute exacerbation (HCC)   Pneumonia due to infectious organism   Bronchiectasis with (acute) exacerbation [facility])    Discharge Instructions  Discharge Instructions     Diet general   Complete by: As directed    Increase activity slowly   Complete by: As directed       Allergies as of 06/22/2023       Reactions   Simvastatin Rash   Cortisone Nausea And Vomiting   Albuterol Palpitations   Augmentin [amoxicillin-pot Clavulanate] Rash   Ciprofloxacin Rash   Contrast Media [iodinated Contrast Media] Rash   Flagyl [metronidazole] Rash   Latex Hives, Rash   Sulfa  Antibiotics Rash  Medication List     STOP taking these medications    azithromycin 250 MG tablet Commonly known as: ZITHROMAX   D3-50 1.25 MG (50000 UT) capsule Generic drug: Cholecalciferol   doxycycline 100 MG tablet Commonly known as: VIBRA-TABS   DULoxetine 60 MG capsule Commonly known as: CYMBALTA   predniSONE 5 MG tablet Commonly known as: DELTASONE       TAKE these medications    ALPRAZolam 0.25 MG tablet Commonly known as: XANAX Take 0.25 mg by mouth 2 (two) times daily as needed for anxiety or sleep.   apixaban 5 MG Tabs tablet Commonly known as: ELIQUIS Take 1 tablet (5 mg total) by mouth 2 (two) times daily.   benzonatate 100 MG capsule Commonly known as: TESSALON Take 2 capsules (200 mg total) by mouth 3 (three) times daily as needed.   budesonide 0.25 MG/2ML nebulizer solution Commonly known as: PULMICORT Take 2 mLs (0.25 mg total) by nebulization 2 (two) times daily.   clotrimazole-betamethasone cream Commonly known as: LOTRISONE Apply 1 Application topically 2 (two) times daily.   ELDERBERRY PO Take 1 tablet by mouth daily.   esomeprazole 40 MG capsule Commonly known as: NEXIUM Take 40 mg by mouth daily at 12 noon.   gabapentin 100 MG capsule Commonly known as: NEURONTIN Take 3 capsules (300 mg total) by mouth at bedtime. What changed: how much to take   guaiFENesin 600 MG 12 hr tablet Commonly known as: MUCINEX Take 1 tablet (600 mg total) by mouth 2 (two) times daily.   levalbuterol 0.63 MG/3ML nebulizer solution Commonly known as: Xopenex Take 3 mLs (0.63 mg total) by nebulization every 6 (six) hours as needed for wheezing or shortness of breath.   magnesium oxide 400 (240 Mg) MG tablet Commonly known as: MAG-OX Take 1 tablet (400 mg total) by mouth daily.   metFORMIN 500 MG tablet Commonly known as: GLUCOPHAGE Take 1 tablet (500 mg total) by mouth daily with breakfast.   metoprolol succinate 25 MG 24 hr  tablet Commonly known as: TOPROL-XL TAKE 1 TABLET BY MOUTH TWICE A DAY What changed:  how much to take additional instructions   minocycline 100 MG capsule Commonly known as: MINOCIN Take 2 capsules (200 mg total) by mouth 2 (two) times daily for 14 days.   neomycin-polymyxin b-dexamethasone 3.5-10000-0.1 Oint Commonly known as: MAXITROL Place 1 Application into both eyes at bedtime. What changed: how to take this   potassium chloride SA 20 MEQ tablet Commonly known as: KLOR-CON M Take 2 tablets (40 mEq total) by mouth daily for 7 days.   sodium chloride HYPERTONIC 3 % nebulizer solution Take 4 mLs by nebulization 2 (two) times daily.   triamcinolone 55 MCG/ACT Aero nasal inhaler Commonly known as: NASACORT Place 1 spray into the nose daily.   Tylenol 325 MG Caps Generic drug: Acetaminophen Take 1-2 capsules by mouth every 6 (six) hours as needed (pain).   VITAMIN B12 PO Take 1 tablet by mouth daily.   zinc gluconate 50 MG tablet Take 50 mg by mouth 2 (two) times a week.        Follow-up Information     [facility] Follow up.   Why: HHPT/OT arranged- they will contact you to schedule early next week Contact information: [address] [place] [address] [phone]                Allergies  Allergen Reactions   Simvastatin Rash   Cortisone Nausea And Vomiting  Albuterol Palpitations   Augmentin [Amoxicillin-Pot Clavulanate] Rash   Ciprofloxacin Rash   Contrast Media [Iodinated Contrast Media] Rash   Flagyl [Metronidazole] Rash   Latex Hives and Rash   Sulfa Antibiotics Rash    Consultations: Pulmonary   Procedures/Studies: CT CHEST WO CONTRAST Result Date: 06/17/2023 CLINICAL DATA:  Cough. Failed inferior treatment for multifocal pneumonia. 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 EXAM: CT CHEST WITHOUT CONTRAST TECHNIQUE: Multidetector CT imaging of the chest was performed following the standard protocol without IV  contrast. RADIATION DOSE REDUCTION: This exam was performed according to the departmental dose-optimization program which includes automated exposure control, adjustment of the mA and/or kV according to patient size and/or use of iterative reconstruction technique. COMPARISON:  09/18/2022 FINDINGS: Cardiovascular: Heart is mildly enlarged. Scattered coronary artery and aortic atherosclerosis. Mediastinum/Nodes: No mediastinal, hilar, or axillary adenopathy. Trachea and esophagus are unremarkable. Thyroid unremarkable. Lungs/Pleura: Biapical scarring. Peribronchovascular nodularity again noted throughout the lungs, most pronounced in the lower lobes where there is also bronchiectasis and mucous plugging, right greater than left. Findings are stable since prior study. No acute areas of consolidation. Trace right pleural effusion, new since prior study. Upper Abdomen: No acute findings Musculoskeletal: Chest wall soft tissues are unremarkable. No acute bony abnormality. IMPRESSION: Bronchiectasis with mucoid impaction and extensive peribronchovascular nodularity again noted in similar to prior study. Compatible with chronic MAC. Trace right pleural effusion. Coronary artery disease. Aortic Atherosclerosis (ICD10-I70.0). Electronically Signed   By: Charlett Nose M.D.   On: 06/17/2023 21:32   DG Chest 2 View Result Date: 06/17/2023 CLINICAL DATA:  Confusion, weakness, recent pneumonia EXAM: CHEST - 2 VIEW COMPARISON:  04/26/2023 FINDINGS: Multifocal patchy opacities, right lower lobe predominant, suggesting multifocal pneumonia. Small right pleural effusion. No pneumothorax. The heart is normal in size. Visualized osseous structures are within normal limits. IMPRESSION: Multifocal pneumonia, right lower lobe predominant. Small right pleural effusion. Electronically Signed   By: Charline Bills M.D.   On: 06/17/2023 17:13   (Echo, Carotid, EGD, Colonoscopy, ERCP)    Subjective: Patient seen in the morning rounds.   He has more coughing at night with some difficulty breathing otherwise denies any complaints.  Afebrile.  Shortness of breath is persisting but slightly improved than previous days.  Daughter on the phone.   Discharge Exam: Vitals:   06/22/23 0744 06/22/23 1119  BP:  (!) 124/52  Pulse:  87  Resp: 16 18  Temp:  (!) 97.3 F (36.3 C)  SpO2:  99%   Vitals:   06/21/23 2300 06/22/23 0321 06/22/23 0744 06/22/23 1119  BP: (!) 91/55 (!) 92/51  (!) 124/52  Pulse: 79 81  87  Resp: 20 20 16 18   Temp: 98.5 F (36.9 C) (!) 97.5 F (36.4 C)  (!) 97.3 F (36.3 C)  TempSrc: Oral Oral  Oral  SpO2: 96% 97%  99%  Weight:      Height:        General: Pt is alert, awake, not in acute distress Frail and debilitated.  Chronically sick looking. Cardiovascular: RRR, S1/S2 +, no rubs, no gallops Respiratory: CTA bilaterally, mostly upper airway conducted sounds.  On room air. Abdominal: Soft, NT, ND, bowel sounds + Extremities: no edema, no cyanosis    The results of significant diagnostics from this hospitalization (including imaging, microbiology, ancillary and laboratory) are listed below for reference.     Microbiology: Recent Results (from the past 240 hours)  Blood culture (routine x 2)     Status: None   Collection  Time: 06/17/23  6:47 PM   Specimen: BLOOD  Result Value Ref Range Status   Specimen Description BLOOD BLOOD LEFT HAND  Final   Special Requests   Final    BOTTLES DRAWN AEROBIC AND ANAEROBIC Blood Culture results may not be optimal due to an inadequate volume of blood received in culture bottles   Culture   Final    NO GROWTH 5 DAYS Performed at [facility], [address], [place], [place] [address]    Report Status 06/22/2023 FINAL  Final  Blood culture (routine x 2)     Status: None   Collection Time: 06/17/23  6:58 PM   Specimen: BLOOD  Result Value Ref Range Status   Specimen Description BLOOD RIGHT ANTECUBITAL  Final   Special Requests   Final     BOTTLES DRAWN AEROBIC AND ANAEROBIC Blood Culture results may not be optimal due to an inadequate volume of blood received in culture bottles   Culture   Final    NO GROWTH 5 DAYS Performed at [facility], [address], [place], [place] [address]    Report Status 06/22/2023 FINAL  Final  MRSA Next Gen by PCR, Nasal     Status: Abnormal   Collection Time: 06/17/23  7:51 PM   Specimen: Nasal Mucosa; Nasal Swab  Result Value Ref Range Status   MRSA by PCR Next Gen DETECTED (A) NOT DETECTED Final    Comment: RESULT CALLED TO, READ BACK BY AND VERIFIED WITH: C COBB,RN@2202  06/17/23 MK (NOTE) The GeneXpert MRSA Assay (FDA approved for NASAL specimens only), is one component of a comprehensive MRSA colonization surveillance program. It is not intended to diagnose MRSA infection nor to guide or monitor treatment for MRSA infections. Test performance is not FDA approved in patients less than 34 years old. Performed at [facility], [address], [place], [place] [address]   Respiratory (~20 pathogens) panel by PCR     Status: None   Collection Time: 06/17/23  9:58 PM   Specimen: Sputum; Respiratory  Result Value Ref Range Status   Adenovirus NOT DETECTED NOT DETECTED Final   Coronavirus 229E NOT DETECTED NOT DETECTED Final    Comment: (NOTE) The Coronavirus on the Respiratory Panel, DOES NOT test for the novel  Coronavirus (2019 nCoV)    Coronavirus HKU1 NOT DETECTED NOT DETECTED Final   Coronavirus NL63 NOT DETECTED NOT DETECTED Final   Coronavirus OC43 NOT DETECTED NOT DETECTED Final   Metapneumovirus NOT DETECTED NOT DETECTED Final   Rhinovirus / Enterovirus NOT DETECTED NOT DETECTED Final   Influenza A NOT DETECTED NOT DETECTED Final   Influenza B NOT DETECTED NOT DETECTED Final   Parainfluenza Virus 1 NOT DETECTED NOT DETECTED Final   Parainfluenza Virus 2 NOT DETECTED NOT DETECTED Final   Parainfluenza Virus 3 NOT DETECTED NOT DETECTED Final   Parainfluenza  Virus 4 NOT DETECTED NOT DETECTED Final   Respiratory Syncytial Virus NOT DETECTED NOT DETECTED Final   Bordetella pertussis NOT DETECTED NOT DETECTED Final   Bordetella Parapertussis NOT DETECTED NOT DETECTED Final   Chlamydophila pneumoniae NOT DETECTED NOT DETECTED Final   Mycoplasma pneumoniae NOT DETECTED NOT DETECTED Final    Comment: Performed at [facility], [address], [place], [place] [address]  Culture, BAL-quantitative w Gram Stain     Status: Abnormal   Collection Time: 06/19/23  2:45 PM   Specimen: Bronchial Washing, Left; Respiratory  Result Value Ref Range Status  Specimen Description BRONCHIAL ALVEOLAR LAVAGE  Final   Special Requests LEFT MAINSTEM  Final   Gram Stain   Final    NO WBC SEEN NO ORGANISMS SEEN Performed at [facility], [address], [place], [place] [address]    Culture STENOTROPHOMONAS MALTOPHILIA (A)  Final   Report Status 06/22/2023 FINAL  Final   Organism ID, Bacteria STENOTROPHOMONAS MALTOPHILIA  Final      Susceptibility   Stenotrophomonas maltophilia - MIC*    LEVOFLOXACIN >=8 RESISTANT Resistant     TRIMETH/SULFA 40 SENSITIVE Sensitive     * STENOTROPHOMONAS MALTOPHILIA  Acid Fast Smear (AFB)     Status: None   Collection Time: 06/19/23  2:45 PM   Specimen: Bronchial Washing, Left; Respiratory  Result Value Ref Range Status   AFB Specimen Processing Concentration  Final   Acid Fast Smear Negative  Final    Comment: (NOTE) Performed At: [facility] [address] [place], [place] [id number] Jolene Schimke MD EX:[id number]    Source (AFB) BRONCHIAL ALVEOLAR LAVAGE  Final    Comment: Performed at [facility], [address], [place], [place] [address]  Culture, BAL-quantitative w Gram Stain     Status: Abnormal   Collection Time: 06/19/23  2:50 PM   Specimen: Bronchial Alveolar Lavage; Respiratory  Result Value Ref Range Status   Specimen Description BRONCHIAL ALVEOLAR LAVAGE  Final   Special  Requests RIGHT MIDDLE LOBE  Final   Gram Stain   Final    RARE WBC PRESENT,BOTH PMN AND MONONUCLEAR NO ORGANISMS SEEN Performed at [facility], [address], [place], [place] [address]    Culture 60,000 COLONIES/mL STENOTROPHOMONAS MALTOPHILIA (A)  Final   Report Status 06/22/2023 FINAL  Final   Organism ID, Bacteria STENOTROPHOMONAS MALTOPHILIA (A)  Final      Susceptibility   Stenotrophomonas maltophilia - MIC*    LEVOFLOXACIN >=8 RESISTANT Resistant     TRIMETH/SULFA <=20 SENSITIVE Sensitive     * 60,000 COLONIES/mL STENOTROPHOMONAS MALTOPHILIA  Acid Fast Smear (AFB)     Status: None   Collection Time: 06/19/23  2:50 PM   Specimen: Bronchial Alveolar Lavage; Respiratory  Result Value Ref Range Status   AFB Specimen Processing Concentration  Final   Acid Fast Smear Negative  Final    Comment: (NOTE) Performed At: [facility] [address] [place], [place] [id number] Jolene Schimke MD DG:[id number]    Source (AFB) BRONCHIAL ALVEOLAR LAVAGE  Final    Comment: Performed at [facility], [address], [place], [place] [address]     Labs: BNP (last 3 results) No results for input(s): "BNP" in the last 8760 hours. Basic Metabolic Panel: Recent Labs  Lab 06/17/23 1627 06/18/23 0249 06/21/23 0248  NA 136 138 134*  K 3.6 3.3* 3.0*  CL 101 103 102  CO2 24 26 25   GLUCOSE 124* 105* 108*  BUN 13 12 9   CREATININE 0.95 1.03 0.88  CALCIUM 8.8* 8.6* 8.1*  MG  --  1.7  --   PHOS  --  3.1  --    Liver Function Tests: Recent Labs  Lab 06/17/23 1847  AST 13*  ALT 14  ALKPHOS 43  BILITOT 1.2*  PROT 6.5  ALBUMIN 2.4*   No results for input(s): "LIPASE", "AMYLASE" in the last 168 hours. No results for input(s): "AMMONIA" in the last 168 hours. CBC: Recent Labs  Lab 06/17/23 1627 06/18/23 0249  WBC 15.3* 12.7*  NEUTROABS  --  9.5*  HGB 12.9* 12.3*  HCT 41.6 39.1  MCV 96.7 97.3  PLT 323 301   Cardiac Enzymes: No results for input(s):  "CKTOTAL", "CKMB", "CKMBINDEX", "TROPONINI" in the last 168 hours. BNP: Invalid input(s): "POCBNP" CBG: Recent Labs  Lab 06/21/23 0607 06/21/23 1123 06/21/23 1651 06/21/23 2054 06/22/23 0616  GLUCAP 97 127* 116* 124* 87   D-Dimer No results for input(s): "DDIMER" in the last 72 hours. Hgb A1c No results for input(s): "HGBA1C" in the last 72 hours. Lipid Profile No results for input(s): "CHOL", "HDL", "LDLCALC", "TRIG", "CHOLHDL", "LDLDIRECT" in the last 72 hours. Thyroid function studies No results for input(s): "TSH", "T4TOTAL", "T3FREE", "THYROIDAB" in the last 72 hours.  Invalid input(s): "FREET3" Anemia work up No results for input(s): "VITAMINB12", "FOLATE", "FERRITIN", "TIBC", "IRON", "RETICCTPCT" in the last 72 hours. Urinalysis    Component Value Date/Time   COLORURINE AMBER (A) 06/18/2023 0204   APPEARANCEUR CLEAR 06/18/2023 0204   LABSPEC 1.029 06/18/2023 0204   PHURINE 5.0 06/18/2023 0204   GLUCOSEU NEGATIVE 06/18/2023 0204   HGBUR SMALL (A) 06/18/2023 0204   BILIRUBINUR NEGATIVE 06/18/2023 0204   KETONESUR NEGATIVE 06/18/2023 0204   PROTEINUR 30 (A) 06/18/2023 0204   NITRITE NEGATIVE 06/18/2023 0204   LEUKOCYTESUR NEGATIVE 06/18/2023 0204   Sepsis Labs Recent Labs  Lab 06/17/23 1627 06/18/23 0249  WBC 15.3* 12.7*   Microbiology Recent Results (from the past 240 hours)  Blood culture (routine x 2)     Status: None   Collection Time: 06/17/23  6:47 PM   Specimen: BLOOD  Result Value Ref Range Status   Specimen Description BLOOD BLOOD LEFT HAND  Final   Special Requests   Final    BOTTLES DRAWN AEROBIC AND ANAEROBIC Blood Culture results may not be optimal due to an inadequate volume of blood received in culture bottles   Culture   Final    NO GROWTH 5 DAYS Performed at [facility], [address], [place], [place] [address]    Report Status 06/22/2023 FINAL  Final  Blood culture (routine x 2)     Status: None   Collection Time: 06/17/23   6:58 PM   Specimen: BLOOD  Result Value Ref Range Status   Specimen Description BLOOD RIGHT ANTECUBITAL  Final   Special Requests   Final    BOTTLES DRAWN AEROBIC AND ANAEROBIC Blood Culture results may not be optimal due to an inadequate volume of blood received in culture bottles   Culture   Final    NO GROWTH 5 DAYS Performed at [facility], [address] [place], [place] [address]    Report Status 06/22/2023 FINAL  Final  MRSA Next Gen by PCR, Nasal     Status: Abnormal   Collection Time: 06/17/23  7:51 PM   Specimen: Nasal Mucosa; Nasal Swab  Result Value Ref Range Status   MRSA by PCR Next Gen DETECTED (A) NOT DETECTED Final    Comment: RESULT CALLED TO, READ BACK BY AND VERIFIED WITH: C COBB,RN@2202  06/17/23 MK (NOTE) The GeneXpert MRSA Assay (FDA approved for NASAL specimens only), is one component of a comprehensive MRSA colonization surveillance program. It is not intended to diagnose MRSA infection nor to guide or monitor treatment for MRSA infections. Test performance is not FDA approved in patients less than 17 years old. Performed at [facility], [address], [place], [place] [address]   Respiratory (~20 pathogens) panel by PCR     Status: None  Collection Time: 06/17/23  9:58 PM   Specimen: Sputum; Respiratory  Result Value Ref Range Status   Adenovirus NOT DETECTED NOT DETECTED Final   Coronavirus 229E NOT DETECTED NOT DETECTED Final    Comment: (NOTE) The Coronavirus on the Respiratory Panel, DOES NOT test for the novel  Coronavirus (2019 nCoV)    Coronavirus HKU1 NOT DETECTED NOT DETECTED Final   Coronavirus NL63 NOT DETECTED NOT DETECTED Final   Coronavirus OC43 NOT DETECTED NOT DETECTED Final   Metapneumovirus NOT DETECTED NOT DETECTED Final   Rhinovirus / Enterovirus NOT DETECTED NOT DETECTED Final   Influenza A NOT DETECTED NOT DETECTED Final   Influenza B NOT DETECTED NOT DETECTED Final   Parainfluenza Virus 1 NOT DETECTED NOT  DETECTED Final   Parainfluenza Virus 2 NOT DETECTED NOT DETECTED Final   Parainfluenza Virus 3 NOT DETECTED NOT DETECTED Final   Parainfluenza Virus 4 NOT DETECTED NOT DETECTED Final   Respiratory Syncytial Virus NOT DETECTED NOT DETECTED Final   Bordetella pertussis NOT DETECTED NOT DETECTED Final   Bordetella Parapertussis NOT DETECTED NOT DETECTED Final   Chlamydophila pneumoniae NOT DETECTED NOT DETECTED Final   Mycoplasma pneumoniae NOT DETECTED NOT DETECTED Final    Comment: Performed at [facility], [address], [place], [place] [address]  Culture, BAL-quantitative w Gram Stain     Status: Abnormal   Collection Time: 06/19/23  2:45 PM   Specimen: Bronchial Washing, Left; Respiratory  Result Value Ref Range Status   Specimen Description BRONCHIAL ALVEOLAR LAVAGE  Final   Special Requests LEFT MAINSTEM  Final   Gram Stain   Final    NO WBC SEEN NO ORGANISMS SEEN Performed at [facility], [address], [place], [place] [address]    Culture STENOTROPHOMONAS MALTOPHILIA (A)  Final   Report Status 06/22/2023 FINAL  Final   Organism ID, Bacteria STENOTROPHOMONAS MALTOPHILIA  Final      Susceptibility   Stenotrophomonas maltophilia - MIC*    LEVOFLOXACIN >=8 RESISTANT Resistant     TRIMETH/SULFA 40 SENSITIVE Sensitive     * STENOTROPHOMONAS MALTOPHILIA  Acid Fast Smear (AFB)     Status: None   Collection Time: 06/19/23  2:45 PM   Specimen: Bronchial Washing, Left; Respiratory  Result Value Ref Range Status   AFB Specimen Processing Concentration  Final   Acid Fast Smear Negative  Final    Comment: (NOTE) Performed At: [facility] [address] [place], [place] [id number] Jolene Schimke MD YT:[id number]    Source (AFB) BRONCHIAL ALVEOLAR LAVAGE  Final    Comment: Performed at [facility], [address], [place], [place] [address]  Culture, BAL-quantitative w Gram Stain     Status: Abnormal   Collection Time: 06/19/23  2:50 PM   Specimen:  Bronchial Alveolar Lavage; Respiratory  Result Value Ref Range Status   Specimen Description BRONCHIAL ALVEOLAR LAVAGE  Final   Special Requests RIGHT MIDDLE LOBE  Final   Gram Stain   Final    RARE WBC PRESENT,BOTH PMN AND MONONUCLEAR NO ORGANISMS SEEN Performed at [facility], [address], [place], [place] [address]    Culture 60,000 COLONIES/mL STENOTROPHOMONAS MALTOPHILIA (A)  Final   Report Status 06/22/2023 FINAL  Final   Organism ID, Bacteria STENOTROPHOMONAS MALTOPHILIA (A)  Final      Susceptibility   Stenotrophomonas maltophilia - MIC*    LEVOFLOXACIN >=8 RESISTANT Resistant     TRIMETH/SULFA <=20 SENSITIVE Sensitive     *  60,000 COLONIES/mL STENOTROPHOMONAS MALTOPHILIA  Acid Fast Smear (AFB)     Status: None   Collection Time: 06/19/23  2:50 PM   Specimen: Bronchial Alveolar Lavage; Respiratory  Result Value Ref Range Status   AFB Specimen Processing Concentration  Final   Acid Fast Smear Negative  Final    Comment: (NOTE) Performed At: [facility] [address] [place], [place] [id number] Jolene Schimke MD EX:[id number]    Source (AFB) BRONCHIAL ALVEOLAR LAVAGE  Final    Comment: Performed at [facility], [address] [place], [place] [address]     Time coordinating discharge: 40 minutes  SIGNED:   Dorcas Carrow, MD  [facility] 06/22/2023, 2:02 PM

## 2023-06-22 NOTE — TOC CM/SW Note (Signed)
Fax orders, facesheet, HH orders and dc summary to [facility]. Isidoro Donning RN3 CCM, Heart Failure TOC CM [phone]

## 2023-06-22 NOTE — Progress Notes (Signed)
Physical Therapy Treatment Patient Details Name: Matthew Hall MRN: [id number] DOB: January 01, 1946 Today's Date: 06/22/2023   History of Present Illness 77 yo male admitted 06/17/23 for multifocal PNA and sepsis. 12/10 bronch. PMH: recent CVA's, rt foot drop, AFib, COPD, HTN, DM, vasculitis, neuropathy, bladder CA    PT Comments  Pt pleasant and agreeable to mobility. Pt completing bed mobility and dressing at EOB modI, requires largely increased time to don shoes and toe lifter. Pt ambulating at [facility] with min cueing for upright posture. Pt toe lifter hooked to highest lacing of shoe, educated on attaching closer to toe box for increased dorsiflexion, pt reported it was more comfortable and has been using it the way it is. Pt tolerated stair training, reported he will be staying at daughters house for a few days when leaving, all bedrooms and bathroom are on second floor. SPO2% >93% RA      If plan is discharge home, recommend the following: Assistance with cooking/housework;Assist for transportation   Can travel by private Psychologist, clinical (4 wheels)    Recommendations for Other Services       Precautions / Restrictions Precautions Precautions: Fall Required Braces or Orthoses: Other Brace Other Brace: toe-lifter for R shoe Restrictions Weight Bearing Restrictions Per Provider Order: No     Mobility  Bed Mobility Overal bed mobility: Modified Independent             General bed mobility comments: incr time; HOB elevated    Transfers Overall transfer level: Needs assistance Equipment used: Rollator (4 wheels) Transfers: Sit to/from Stand Sit to Stand: Supervision           General transfer comment: 2xSTS from EOB and from rollator, cueing for locking rollator and controled descent    Ambulation/Gait Ambulation/Gait assistance: Contact guard assist Gait Distance (Feet): 120 Feet Assistive device: Rollator (4 wheels) Gait  Pattern/deviations: Step-through pattern, Decreased dorsiflexion - right       General Gait Details: ambulated 170ft to stairs, seateed rest break, ambulated flight of stairs returned to room 12ft, min cueing for upright posture during ambulation   Stairs Stairs: Yes Stairs assistance: Modified independent (Device/Increase time) Stair Management: Two rails, Step to pattern Number of Stairs: 12 General stair comments: Cueing for leading up stairs wirth LLE and down with RLE, cueing for upright postue, supervison for safety   Wheelchair Mobility     Tilt Bed    Modified Rankin (Stroke Patients Only)       Balance Overall balance assessment: Needs assistance Sitting-balance support: No upper extremity supported, Feet supported Sitting balance-Leahy Scale: Good Sitting balance - Comments: donned boxers, shoes and toe lifter EOB   Standing balance support: No upper extremity supported, During functional activity, Bilateral upper extremity supported Standing balance-Leahy Scale: Fair Standing balance comment: use of rollator in standing, unsupported at sink                            Cognition Arousal: Alert Behavior During Therapy: WFL for tasks assessed/performed Overall Cognitive Status: Within Functional Limits for tasks assessed                                          Exercises      General Comments        Pertinent Vitals/Pain  Pain Assessment Pain Assessment: No/denies pain    Home Living                          Prior Function            PT Goals (current goals can now be found in the care plan section) Progress towards PT goals: Progressing toward goals    Frequency    Min 1X/week      PT Plan      Co-evaluation              AM-PAC PT "6 Clicks" Mobility   Outcome Measure  Help needed turning from your back to your side while in a flat bed without using bedrails?: None Help needed moving  from lying on your back to sitting on the side of a flat bed without using bedrails?: None Help needed moving to and from a bed to a chair (including a wheelchair)?: A Little Help needed standing up from a chair using your arms (e.g., wheelchair or bedside chair)?: A Little Help needed to walk in hospital room?: A Little Help needed climbing 3-5 steps with a railing? : A Little 6 Click Score: 20    End of Session Equipment Utilized During Treatment: Gait belt Activity Tolerance: Patient tolerated treatment well Patient left: in bed;with call bell/phone within reach Nurse Communication: Mobility status PT Visit Diagnosis: Other abnormalities of gait and mobility (R26.89);Other (comment)     Time: 1308-6578 PT Time Calculation (min) (ACUTE ONLY): 42 min  Charges:    $Gait Training: 8-22 mins $Therapeutic Activity: 8-22 mins PT General Charges $$ ACUTE PT VISIT: 1 Visit                     Andrey Farmer SPT Secure chat preferred    Darlin Drop 06/22/2023, 11:46 AM

## 2023-06-22 NOTE — TOC Transition Note (Addendum)
Transition of Care (TOC) - Discharge Note Donn Pierini RN, BSN Transitions of Care Unit 4E- RN Case Manager See Treatment Team for direct phone #   Patient Details  Name: Matthew Hall MRN: [id number] Date of Birth: 07-09-1946  Transition of Care [facility]) CM/SW Contact:  Darrold Span, RN Phone Number: 06/22/2023, 11:49 AM   Clinical Narrative:    Pt stable for transition home today, HHPT/OT orders in- call made to [facility] as per pt choice- spoke with Will- referral accepted for needed services and they will contact pt for start of care early next week. Orders faxed to [phone].   Pt's daughter called and voiced that pt will be staying with her in [place] over the weekend, insurance has denied pt's wife to go to SNF- so family is trying to figure out their plan.  [name] will transport pt home and be here around 12N. [name] is also requesting that meds be filled here- explained pt has one med (Pulmicort) that can not be filled hereMorrie Sheldon requesting that it be sent to [facility] [address] for her to pick up.  Msg sent to MD.   CM spoke with pt at bedside to update on [facility] referral and conversation with [name]- pt also on phone with daughter and aware that wife has been declined for SNF.  Pt requested info for the [facility]- contact info provided.   No further TOC needs noted.    Final next level of care: Home w Home Health Services Barriers to Discharge: Barriers Resolved   Patient Goals and CMS Choice Patient states their goals for this hospitalization and ongoing recovery are:: return home CMS Medicare.gov Compare Post Acute Care list provided to:: Patient Choice offered to / list presented to : Patient [place] ownership interest in [facility].provided to:: Patient    Discharge Placement               Home w/ [facility]        Discharge Plan and Services Additional resources added to the After Visit Summary for   In-house Referral: Clinical  Social Work Discharge Planning Services: CM Consult Post Acute Care Choice: Home Health          DME Arranged: N/A DME Agency: NA       HH Arranged: PT, OT HH Agency: [facility] Date HH Agency Contacted: 06/22/23 Time HH Agency Contacted: 1030 Representative spoke with at [facility] Agency: Will  Social Drivers of Health (SDOH) Interventions SDOH Screenings   Food Insecurity: Food Insecurity Present (06/18/2023)  Housing: Low Risk  (06/21/2023)  Recent Concern: Housing - High Risk (04/16/2023)  Transportation Needs: Unmet Transportation Needs (06/18/2023)  Utilities: Not At Risk (06/18/2023)  Depression (PHQ2-9): Low Risk  (05/04/2019)  Tobacco Use: Medium Risk (06/19/2023)     Readmission Risk Interventions    06/22/2023   11:49 AM  Readmission Risk Prevention Plan  Transportation Screening Complete  Home Care Screening Complete  Medication Review (RN CM) Complete

## 2023-06-25 ENCOUNTER — Encounter: Payer: Self-pay | Admitting: Pulmonary Disease

## 2023-06-25 ENCOUNTER — Telehealth: Payer: Self-pay | Admitting: Pulmonary Disease

## 2023-06-25 ENCOUNTER — Other Ambulatory Visit (HOSPITAL_COMMUNITY): Payer: Self-pay

## 2023-06-25 NOTE — Telephone Encounter (Signed)
Spoke with patient's daughter, Matthew Hall ([place]). Explained use of budesonide and sodium chloride. Per pharmacy patient has not received levalbuterol neb solution in several years (doesn't even show in their system). Daughter states she does have this for him, and has been giving to him as directed on box. Advised her to check to see if med has expired; also reviewed use of levalbuterol for wheezing/shob. She states patient believes the minocycline will "fix" his lung disease and he will be much better soon. She states he may be in denial of his prognosis. She will speak with provider about this when they come in for follow up visit. She will also contact us if she finds levalbuterol has expired. Nothing further needed at this time.

## 2023-06-25 NOTE — Telephone Encounter (Signed)
Patient's daughter is calling back. She still needs to know how to administer her fathers nebulizer medication with his other meds. She isn't sure how to mix the solutions/sequence them to optimize his breathing. [phone]

## 2023-06-25 NOTE — Telephone Encounter (Signed)
Per daughter, [name] (DPR), patient is currently staying with her in [place] and she can bring him to [facility] for office visit. Please contact her directly for scheduling [phone].

## 2023-06-25 NOTE — Telephone Encounter (Signed)
Patient's daughter is calling. She wants to know the frequency of her fathers medications and nebulizer. He was released from the hospital last Friday.

## 2023-06-25 NOTE — Progress Notes (Signed)
Patient ID: Matthew Hall, male   DOB: 15-Sep-1945, 77 y.o.   MRN: [id number]  Reason for Consult: PAD and New Patient (Initial Visit)   Referred by Alinda Deem, MD  Subjective:     HPI  Matthew Hall is a 77 y.o. male atrial fibrillation, hypertension, COPD, diabetes and CVA on 04/12/23 due to inconsistent INR's.  During his hospitalization a full vascular workup was obtained which demonstrated minimal carotid disease.  An ABI was also obtained due to difficulty palpating a DP pulse in the left.  This demonstrated normal ABIs bilaterally but ischemic toe pressures.  He denies claudication, rest pain or ulceration.  He is compliant with Eliquis, but is not on aspirin or statin.  Past Medical History:  Diagnosis Date   Anxiety    Aortic atherosclerosis (HCC)    Atrial fibrillation (HCC)    Basal cell carcinoma    Bladder cancer [facility])    Bladder tumor    Colon polyps    COPD (chronic obstructive pulmonary disease) (HCC)    Cutaneous vasculitis    Diabetic peripheral neuropathy (HCC) 12/27/2020   Diverticulosis    Essential hypertension    GERD (gastroesophageal reflux disease)    History of pneumonia    Nephrolithiasis    Osteoarthritis    Right foot drop    Stroke [facility])    Family History  Problem Relation Age of Onset   Diabetes Mother    CAD Mother    Emphysema Father    Diabetes Sister    Stroke Sister    Lung cancer Brother    Liver cancer Brother    Colon cancer Neg Hx    Past Surgical History:  Procedure Laterality Date   BRONCHIAL WASHINGS  06/19/2023   Procedure: BRONCHIAL WASHINGS;  Surgeon: Steffanie Dunn, DO;  Location: [facility] END[facility]COPY;  Service: Cardiopulmonary;;   COLONOSCOPY     Kidney stones removed  1996   SKIN CANCER EXCISION Left    2003   TRANSURETHRAL RESECTION OF BLADDER TUMOR  1996   VIDEO BRONCHOSCOPY N/A 06/19/2023   Procedure: VIDEO BRONCHOSCOPY WITHOUT FLUORO;  Surgeon: Steffanie Dunn, DO;  Location: [facility] ENDOSCOPY;  Service:  Cardiopulmonary;  Laterality: N/A;    Short Social History:  Social History   Tobacco Use   Smoking status: Former    Current packs/day: 0.00    Average packs/day: 1 pack/day for 30.0 years (30.0 ttl pk-yrs)    Types: Cigarettes    Start date: 07/06/1981    Quit date: 07/07/2011    Years since quitting: 11.9   Smokeless tobacco: Never  Substance Use Topics   Alcohol use: No    Alcohol/week: 0.0 standard drinks of alcohol    Allergies  Allergen Reactions   Simvastatin Rash   Cortisone Nausea And Vomiting   Albuterol Palpitations   Augmentin [Amoxicillin-Pot Clavulanate] Rash   Ciprofloxacin Rash   Contrast Media [Iodinated Contrast Media] Rash   Flagyl [Metronidazole] Rash   Latex Hives and Rash   Sulfa Antibiotics Rash    Current Outpatient Medications  Medication Sig Dispense Refill   Acetaminophen (TYLENOL) 325 MG CAPS Take 1-2 capsules by mouth every 6 (six) hours as needed (pain).     ALPRAZolam (XANAX) 0.25 MG tablet Take 0.25 mg by mouth 2 (two) times daily as needed for anxiety or sleep.   3   apixaban (ELIQUIS) 5 MG TABS tablet Take 1 tablet (5 mg total) by mouth 2 (two) times daily.  benzonatate (TESSALON) 100 MG capsule Take 2 capsules (200 mg total) by mouth 3 (three) times daily as needed. 30 capsule 0   budesonide (PULMICORT) 0.25 MG/2ML nebulizer solution Take 2 mLs (0.25 mg total) by nebulization 2 (two) times daily. 60 mL 12   clotrimazole-betamethasone (LOTRISONE) cream Apply 1 Application topically 2 (two) times daily.     Cyanocobalamin (VITAMIN B12 PO) Take 1 tablet by mouth daily.     ELDERBERRY PO Take 1 tablet by mouth daily.     esomeprazole (NEXIUM) 40 MG capsule Take 40 mg by mouth daily at 12 noon.     gabapentin (NEURONTIN) 100 MG capsule Take 3 capsules (300 mg total) by mouth at bedtime. (Patient taking differently: Take 100 mg by mouth at bedtime.) 90 capsule 11   guaiFENesin (MUCINEX) 600 MG 12 hr tablet Take 1 tablet (600 mg total) by  mouth 2 (two) times daily. 60 tablet 0   magnesium oxide (MAG-OX) 400 (240 Mg) MG tablet Take 1 tablet (400 mg total) by mouth daily. 30 tablet 0   metFORMIN (GLUCOPHAGE) 500 MG tablet Take 1 tablet (500 mg total) by mouth daily with breakfast.     metoprolol succinate (TOPROL-XL) 25 MG 24 hr tablet TAKE 1 TABLET BY MOUTH TWICE A DAY (Patient taking differently: Take 25-50 mg by mouth 2 (two) times daily. Alternate taking 25mg  by mouth twice daily and 50mg  by mouth twice daily.) 180 tablet 0   minocycline (MINOCIN) 100 MG capsule Take 2 capsules (200 mg total) by mouth 2 (two) times daily for 14 days. 56 capsule 0   neomycin-polymyxin b-dexamethasone (MAXITROL) 3.5-10000-0.1 OINT Place 1 Application into both eyes at bedtime. 3.5 g 0   potassium chloride SA (KLOR-CON M) 20 MEQ tablet Take 2 tablets (40 mEq total) by mouth daily for 7 days. 14 tablet 0   sodium chloride HYPERTONIC 3 % nebulizer solution Take 4 mLs by nebulization 2 (two) times daily. 750 mL 12   triamcinolone (NASACORT) 55 MCG/ACT AERO nasal inhaler Place 1 spray into the nose daily.     zinc gluconate 50 MG tablet Take 50 mg by mouth 2 (two) times a week.     No current facility-administered medications for this visit.    REVIEW OF SYSTEMS  Negative other than noted in HPI     Objective:  Objective   Vitals:   06/26/23 0906  BP: (!) 88/54  Pulse: (!) 102  SpO2: 93%  Weight: 150 lb (68 kg)  Height: 5\' 11"  (1.803 m)   Body mass index is 20.92 kg/m.  No acute distress Regular rate and rhythm Unlabored breathing No palpable pedal pulses Weakly palpable PT on the left Weakly palpable DP on the right  Data: ABI: 04/13/23 +---------+------------------+-----+-------------------+--------+  Right   Rt Pressure (mmHg)IndexWaveform           Comment   +---------+------------------+-----+-------------------+--------+  Brachial 111                    triphasic                     +---------+------------------+-----+-------------------+--------+  PTA     98                0.88 dampened monophasic          +---------+------------------+-----+-------------------+--------+  DP      110               0.99 biphasic                     +---------+------------------+-----+-------------------+--------+  Great Toe66                0.59 Normal                       +---------+------------------+-----+-------------------+--------+   +---------+------------------+-----+---------+-------+  Left    Lt Pressure (mmHg)IndexWaveform Comment  +---------+------------------+-----+---------+-------+  Brachial 109                    triphasic         +---------+------------------+-----+---------+-------+  PTA     115               1.04 triphasic         +---------+------------------+-----+---------+-------+  DP      107               0.96 biphasic          +---------+------------------+-----+---------+-------+  Great Toe0                 0.00 Absent            +---------+------------------+-----+---------+-------+   Carotid duplex: 04/13/23 1 to 39% bilaterally  Echo: 04/13/23 EF 40-45%, no rwma, moderate TV regurg     Assessment/Plan:     Matthew Hall is a 77 y.o. male with "small vessel" peripheral arterial disease by clinical exam and noninvasive testing.   Recommendations to optimize cardiovascular risk: Abstinence from all tobacco products. Blood glucose control with goal A1c < 7%. Blood pressure control with goal blood pressure < 140/90 mmHg. Lipid reduction therapy with goal LDL-C <100 mg/dL  Aspirin 81mg  PO QD.  Atorvastatin 40-80mg  PO QD (or other "high intensity" statin therapy).  Encouraged his daughter to monitor his feet. Should he develop ulceration, he should be considered for angiography. Follow up with me should this occur.   Leonie Douglas MD [facility]

## 2023-06-26 ENCOUNTER — Encounter: Payer: Medicare Other | Admitting: Vascular Surgery

## 2023-06-26 ENCOUNTER — Ambulatory Visit (INDEPENDENT_AMBULATORY_CARE_PROVIDER_SITE_OTHER): Payer: Medicare Other | Admitting: Vascular Surgery

## 2023-06-26 ENCOUNTER — Encounter: Payer: Self-pay | Admitting: Vascular Surgery

## 2023-06-26 VITALS — BP 88/54 | HR 102 | Ht 71.0 in | Wt 150.0 lb

## 2023-06-26 DIAGNOSIS — I739 Peripheral vascular disease, unspecified: Secondary | ICD-10-CM

## 2023-07-02 ENCOUNTER — Ambulatory Visit: Payer: Medicare Other | Admitting: Podiatry

## 2023-07-02 ENCOUNTER — Ambulatory Visit: Payer: Medicare Other | Admitting: Neurology

## 2023-07-02 ENCOUNTER — Encounter: Payer: Self-pay | Admitting: Podiatry

## 2023-07-02 VITALS — Ht 71.0 in | Wt 150.0 lb

## 2023-07-02 DIAGNOSIS — B351 Tinea unguium: Secondary | ICD-10-CM | POA: Insufficient documentation

## 2023-07-02 DIAGNOSIS — M79674 Pain in right toe(s): Secondary | ICD-10-CM | POA: Diagnosis not present

## 2023-07-02 DIAGNOSIS — M79675 Pain in left toe(s): Secondary | ICD-10-CM

## 2023-07-02 DIAGNOSIS — L03032 Cellulitis of left toe: Secondary | ICD-10-CM | POA: Diagnosis not present

## 2023-07-02 HISTORY — DX: Pain in right toe(s): B35.1

## 2023-07-02 NOTE — Progress Notes (Signed)
This patient presents to the office with chief complaint of long thick nails and diabetic feet.  This patient  says there  is  burning in his  feet.  This patient says there are long thick painful nails.  These nails are painful walking and wearing shoes.   Patient is unable to  self treat his own nails . Patient says he has pain at the tip og the outside border big toenail left foot. This patient presents  to the office today for treatment of the  long nails and a foot evaluation due to history of  diabetes. He also has DPN, CVA and coagulation defect.  He has has right foot weaknes which he wears a brace.  General Appearance  Alert, conversant and in no acute stress.  Vascular  Dorsalis pedis pulses are palpable  bilaterally.  Posterior tibial pulses are absent.   Capillary return is within normal limits  bilaterally. Cold feet.  Absent hair  B/L. bilaterally.  Neurologic  Senn-Weinstein monofilament wire test within normal limits  right.  Absent LOPS left foot.   Muscle power within normal limits bilaterally.  Nails Thick disfigured discolored nails with subungual debris  from hallux to fifth toes bilaterally. No evidence of bacterial infection or drainage bilaterally. Redness and granulalion tissie at distal aspect lateral border left foot.  Orthopedic  No limitations of motion of motion feet .  No crepitus or effusions noted.  Mild HAV  B/L.  Skin  normotropic skin with no porokeratosis noted bilaterally.  No signs of infections or ulcers noted.     Onychomycosis  Paronychia lateral border left foot.  IE  Debride nails x 10.  Incision and Draninage   left hallux.  Neosporin/DSD.  Peroxide at home. A diabetic foot exam was performed and there is  evidence of vascular and neurologic pathology.   RTC 3 months.   Helane Gunther DPM

## 2023-07-05 ENCOUNTER — Ambulatory Visit: Payer: Medicare Other | Admitting: Podiatry

## 2023-07-12 ENCOUNTER — Other Ambulatory Visit (HOSPITAL_BASED_OUTPATIENT_CLINIC_OR_DEPARTMENT_OTHER): Payer: Self-pay

## 2023-07-12 ENCOUNTER — Emergency Department (HOSPITAL_BASED_OUTPATIENT_CLINIC_OR_DEPARTMENT_OTHER): Payer: Medicare Other | Admitting: Radiology

## 2023-07-12 ENCOUNTER — Encounter (HOSPITAL_BASED_OUTPATIENT_CLINIC_OR_DEPARTMENT_OTHER): Payer: Self-pay | Admitting: Emergency Medicine

## 2023-07-12 ENCOUNTER — Inpatient Hospital Stay (HOSPITAL_BASED_OUTPATIENT_CLINIC_OR_DEPARTMENT_OTHER)
Admission: EM | Admit: 2023-07-12 | Discharge: 2023-07-15 | DRG: 189 | Disposition: A | Discharge status: Home Health | Payer: Medicare Other | Attending: Internal Medicine | Admitting: Internal Medicine

## 2023-07-12 ENCOUNTER — Other Ambulatory Visit: Payer: Self-pay

## 2023-07-12 ENCOUNTER — Ambulatory Visit (HOSPITAL_BASED_OUTPATIENT_CLINIC_OR_DEPARTMENT_OTHER): Payer: Medicare Other | Admitting: Pulmonary Disease

## 2023-07-12 ENCOUNTER — Telehealth (HOSPITAL_BASED_OUTPATIENT_CLINIC_OR_DEPARTMENT_OTHER): Payer: Self-pay | Admitting: Pulmonary Disease

## 2023-07-12 VITALS — BP 84/58 | HR 105 | Resp 20

## 2023-07-12 DIAGNOSIS — Z888 Allergy status to other drugs, medicaments and biological substances status: Secondary | ICD-10-CM

## 2023-07-12 DIAGNOSIS — Z91040 Latex allergy status: Secondary | ICD-10-CM

## 2023-07-12 DIAGNOSIS — Z883 Allergy status to other anti-infective agents status: Secondary | ICD-10-CM

## 2023-07-12 DIAGNOSIS — D72829 Elevated white blood cell count, unspecified: Secondary | ICD-10-CM | POA: Diagnosis present

## 2023-07-12 DIAGNOSIS — Z87891 Personal history of nicotine dependence: Secondary | ICD-10-CM

## 2023-07-12 DIAGNOSIS — Z8673 Personal history of transient ischemic attack (TIA), and cerebral infarction without residual deficits: Secondary | ICD-10-CM

## 2023-07-12 DIAGNOSIS — R0602 Shortness of breath: Principal | ICD-10-CM

## 2023-07-12 DIAGNOSIS — Z7951 Long term (current) use of inhaled steroids: Secondary | ICD-10-CM

## 2023-07-12 DIAGNOSIS — E1165 Type 2 diabetes mellitus with hyperglycemia: Secondary | ICD-10-CM | POA: Diagnosis present

## 2023-07-12 DIAGNOSIS — Z823 Family history of stroke: Secondary | ICD-10-CM

## 2023-07-12 DIAGNOSIS — I480 Paroxysmal atrial fibrillation: Secondary | ICD-10-CM | POA: Diagnosis present

## 2023-07-12 DIAGNOSIS — I5022 Chronic systolic (congestive) heart failure: Secondary | ICD-10-CM | POA: Diagnosis present

## 2023-07-12 DIAGNOSIS — Z6820 Body mass index (BMI) 20.0-20.9, adult: Secondary | ICD-10-CM

## 2023-07-12 DIAGNOSIS — E440 Moderate protein-calorie malnutrition: Secondary | ICD-10-CM | POA: Insufficient documentation

## 2023-07-12 DIAGNOSIS — Z7901 Long term (current) use of anticoagulants: Secondary | ICD-10-CM

## 2023-07-12 DIAGNOSIS — Z880 Allergy status to penicillin: Secondary | ICD-10-CM

## 2023-07-12 DIAGNOSIS — I700 Atherosclerosis of aorta: Secondary | ICD-10-CM | POA: Diagnosis present

## 2023-07-12 DIAGNOSIS — A419 Sepsis, unspecified organism: Secondary | ICD-10-CM

## 2023-07-12 DIAGNOSIS — Z881 Allergy status to other antibiotic agents status: Secondary | ICD-10-CM

## 2023-07-12 DIAGNOSIS — E119 Type 2 diabetes mellitus without complications: Secondary | ICD-10-CM

## 2023-07-12 DIAGNOSIS — Z87442 Personal history of urinary calculi: Secondary | ICD-10-CM

## 2023-07-12 DIAGNOSIS — J441 Chronic obstructive pulmonary disease with (acute) exacerbation: Principal | ICD-10-CM | POA: Diagnosis present

## 2023-07-12 DIAGNOSIS — I959 Hypotension, unspecified: Secondary | ICD-10-CM | POA: Diagnosis present

## 2023-07-12 DIAGNOSIS — K219 Gastro-esophageal reflux disease without esophagitis: Secondary | ICD-10-CM | POA: Diagnosis present

## 2023-07-12 DIAGNOSIS — J309 Allergic rhinitis, unspecified: Secondary | ICD-10-CM | POA: Diagnosis present

## 2023-07-12 DIAGNOSIS — J9601 Acute respiratory failure with hypoxia: Principal | ICD-10-CM | POA: Diagnosis present

## 2023-07-12 DIAGNOSIS — Z882 Allergy status to sulfonamides status: Secondary | ICD-10-CM

## 2023-07-12 DIAGNOSIS — F411 Generalized anxiety disorder: Secondary | ICD-10-CM | POA: Diagnosis present

## 2023-07-12 DIAGNOSIS — Z8249 Family history of ischemic heart disease and other diseases of the circulatory system: Secondary | ICD-10-CM

## 2023-07-12 DIAGNOSIS — I493 Ventricular premature depolarization: Secondary | ICD-10-CM | POA: Diagnosis present

## 2023-07-12 DIAGNOSIS — Z860100 Personal history of colon polyps, unspecified: Secondary | ICD-10-CM

## 2023-07-12 DIAGNOSIS — Z794 Long term (current) use of insulin: Secondary | ICD-10-CM

## 2023-07-12 DIAGNOSIS — Z8551 Personal history of malignant neoplasm of bladder: Secondary | ICD-10-CM

## 2023-07-12 DIAGNOSIS — Z7984 Long term (current) use of oral hypoglycemic drugs: Secondary | ICD-10-CM

## 2023-07-12 DIAGNOSIS — I110 Hypertensive heart disease with heart failure: Secondary | ICD-10-CM | POA: Diagnosis present

## 2023-07-12 DIAGNOSIS — Z91041 Radiographic dye allergy status: Secondary | ICD-10-CM

## 2023-07-12 DIAGNOSIS — Z1152 Encounter for screening for COVID-19: Secondary | ICD-10-CM

## 2023-07-12 DIAGNOSIS — Z85828 Personal history of other malignant neoplasm of skin: Secondary | ICD-10-CM

## 2023-07-12 DIAGNOSIS — R9431 Abnormal electrocardiogram [ECG] [EKG]: Secondary | ICD-10-CM | POA: Diagnosis present

## 2023-07-12 DIAGNOSIS — Z833 Family history of diabetes mellitus: Secondary | ICD-10-CM

## 2023-07-12 DIAGNOSIS — Z825 Family history of asthma and other chronic lower respiratory diseases: Secondary | ICD-10-CM

## 2023-07-12 DIAGNOSIS — E1142 Type 2 diabetes mellitus with diabetic polyneuropathy: Secondary | ICD-10-CM | POA: Diagnosis present

## 2023-07-12 HISTORY — DX: Chronic systolic (congestive) heart failure: I50.22

## 2023-07-12 LAB — CBC WITH DIFFERENTIAL/PLATELET
Abs Immature Granulocytes: 0.08 10*3/uL — ABNORMAL HIGH (ref 0.00–0.07)
Basophils Absolute: 0.1 10*3/uL (ref 0.0–0.1)
Basophils Relative: 0 %
Eosinophils Absolute: 0.1 10*3/uL (ref 0.0–0.5)
Eosinophils Relative: 1 %
HCT: 40.2 % (ref 39.0–52.0)
Hemoglobin: 13.3 g/dL (ref 13.0–17.0)
Immature Granulocytes: 1 %
Lymphocytes Relative: 13 %
Lymphs Abs: 1.4 10*3/uL (ref 0.7–4.0)
MCH: 30.4 pg (ref 26.0–34.0)
MCHC: 33.1 g/dL (ref 30.0–36.0)
MCV: 92 fL (ref 80.0–100.0)
Monocytes Absolute: 1.6 10*3/uL — ABNORMAL HIGH (ref 0.1–1.0)
Monocytes Relative: 14 %
Neutro Abs: 8.2 10*3/uL — ABNORMAL HIGH (ref 1.7–7.7)
Neutrophils Relative %: 71 %
Platelets: 272 10*3/uL (ref 150–400)
RBC: 4.37 MIL/uL (ref 4.22–5.81)
RDW: 13.9 % (ref 11.5–15.5)
WBC: 11.4 10*3/uL — ABNORMAL HIGH (ref 4.0–10.5)
nRBC: 0 % (ref 0.0–0.2)

## 2023-07-12 LAB — COMPREHENSIVE METABOLIC PANEL
ALT: 13 U/L (ref 0–44)
AST: 16 U/L (ref 15–41)
Albumin: 3.1 g/dL — ABNORMAL LOW (ref 3.5–5.0)
Alkaline Phosphatase: 77 U/L (ref 38–126)
Anion gap: 7 (ref 5–15)
BUN: 14 mg/dL (ref 8–23)
CO2: 30 mmol/L (ref 22–32)
Calcium: 8.6 mg/dL — ABNORMAL LOW (ref 8.9–10.3)
Chloride: 97 mmol/L — ABNORMAL LOW (ref 98–111)
Creatinine, Ser: 0.71 mg/dL (ref 0.61–1.24)
GFR, Estimated: >60 mL/min (ref >60)
Glucose, Bld: 117 mg/dL — ABNORMAL HIGH (ref 70–99)
Potassium: 3.6 mmol/L (ref 3.5–5.1)
Sodium: 134 mmol/L — ABNORMAL LOW (ref 135–145)
Total Bilirubin: 0.6 mg/dL (ref 0.0–1.2)
Total Protein: 6.8 g/dL (ref 6.5–8.1)

## 2023-07-12 LAB — URINALYSIS, W/ REFLEX TO CULTURE (INFECTION SUSPECTED)
Bacteria, UA: NONE SEEN
Bilirubin Urine: NEGATIVE
Glucose, UA: NEGATIVE mg/dL
Ketones, ur: NEGATIVE mg/dL
Leukocytes,Ua: NEGATIVE
Nitrite: NEGATIVE
Protein, ur: NEGATIVE mg/dL
Specific Gravity, Urine: 1.011 (ref 1.005–1.030)
pH: 6 (ref 5.0–8.0)

## 2023-07-12 LAB — PROTIME-INR
INR: 1.8 — ABNORMAL HIGH (ref 0.8–1.2)
Prothrombin Time: 21.3 s — ABNORMAL HIGH (ref 11.4–15.2)

## 2023-07-12 LAB — RESP PANEL BY RT-PCR (RSV, FLU A&B, COVID)  RVPGX2
Influenza A by PCR: NEGATIVE
Influenza B by PCR: NEGATIVE
Resp Syncytial Virus by PCR: NEGATIVE
SARS Coronavirus 2 by RT PCR: NEGATIVE

## 2023-07-12 LAB — LACTIC ACID, PLASMA: Lactic Acid, Venous: 1.3 mmol/L (ref 0.5–1.9)

## 2023-07-12 MED ORDER — IPRATROPIUM BROMIDE 0.02 % IN SOLN
0.5000 mg | Freq: Once | RESPIRATORY_TRACT | Status: AC
  Administered 2023-07-12: 0.5 mg via RESPIRATORY_TRACT
  Filled 2023-07-12: qty 2.5

## 2023-07-12 MED ORDER — METHYLPREDNISOLONE SODIUM SUCC 125 MG IJ SOLR
125.0000 mg | Freq: Once | INTRAMUSCULAR | Status: AC
  Administered 2023-07-12: 125 mg via INTRAVENOUS
  Filled 2023-07-12: qty 2

## 2023-07-12 MED ORDER — ALBUTEROL SULFATE (2.5 MG/3ML) 0.083% IN NEBU
2.5000 mg | INHALATION_SOLUTION | Freq: Once | RESPIRATORY_TRACT | Status: AC
  Administered 2023-07-12: 2.5 mg via RESPIRATORY_TRACT
  Filled 2023-07-12: qty 3

## 2023-07-12 MED ORDER — LEVALBUTEROL HCL 1.25 MG/0.5ML IN NEBU
1.2500 mg | INHALATION_SOLUTION | Freq: Once | RESPIRATORY_TRACT | Status: AC
  Administered 2023-07-12: 1.25 mg via RESPIRATORY_TRACT
  Filled 2023-07-12: qty 0.5

## 2023-07-12 NOTE — Telephone Encounter (Signed)
 Patients daughter called states father is not doing well after completing medication given after hospital stay patient completed 5 days ago, patient is now coughing up green phlem again, fatigued, O2 stats stayed between 88-92 last night.  Daughter is wondering if they need to go to the ED again, patient refuses. Offered appointment with Dr. Kassie today at 1:45

## 2023-07-12 NOTE — Telephone Encounter (Signed)
 Attempted to call daughter back. Patient will need to bring all medications with them at the appointment. They will plan on doing CXR while patient is here

## 2023-07-12 NOTE — ED Notes (Signed)
Pt placed on 2L O2 - MD aware.

## 2023-07-12 NOTE — Progress Notes (Signed)
 Plan of Care Note for accepted transfer   Patient: Matthew Hall MRN: [id number]   DOA: 07/12/2023  Facility requesting transfer: [facility].  ER. Requesting Provider: Dr. Jerral. Reason for transfer: Respiratory failure secondary to COPD. Facility course: 78 year old male with history of COPD, diabetes mellitus, stroke, A-fib recently admitted in December for COPD exacerbation bronchiectasis and MAC has been having worsening shortness of breath for the last 3 to 4 days and had gone to his pulmonologist office and was referred to the ER initially concerning for hypotension but blood pressure was stable as per the ER physician.  Patient is found to be diffusely wheezing COVID and flu test were negative started on medications for COPD exacerbation presently on 2 L oxygen .  Plan of care: The patient is accepted for admission to Telemetry unit, at [facility]..   Author: Redia LOISE Cleaver, MD 07/12/2023  Check [email or web address] for on-call coverage.  Nursing staff, Please call [facility] Admits & Consults System-Wide number on Amion as soon as patient's arrival, so appropriate admitting provider can evaluate the pt.

## 2023-07-12 NOTE — ED Notes (Signed)
Sent urine to the lab

## 2023-07-12 NOTE — ED Provider Notes (Signed)
 [facility] AT [facility] Provider Note   CSN: [id number] Arrival date & time: 07/12/23  1425     History Chief Complaint  Patient presents with   Hypotension    HPI CHADD TOLLISON is a 78 y.o. male presenting for shortness of breath and fatigue.  78 year old male history of COPD not on oxygen .  Recently admitted for pneumonia and recently staying with family.  Grandson diagnosed with RSV earlier this week. The patient endorses weakness fatigue confusion otherwise generalized malaise. Family member at bedside states that at baseline he is able to completely independently take care of himself but that he has been too weak even get out of bed over the last 48 hours.  Patient's recorded medical, surgical, social, medication list and allergies were reviewed in the Snapshot window as part of the initial history.   Review of Systems   Review of Systems  Constitutional:  Positive for activity change and fatigue. Negative for chills and fever.  HENT:  Negative for ear pain and sore throat.   Eyes:  Negative for pain and visual disturbance.  Respiratory:  Positive for cough and shortness of breath.   Cardiovascular:  Negative for chest pain and palpitations.  Gastrointestinal:  Negative for abdominal pain and vomiting.  Genitourinary:  Negative for dysuria and hematuria.  Musculoskeletal:  Negative for arthralgias and back pain.  Skin:  Negative for color change and rash.  Neurological:  Positive for weakness. Negative for seizures and syncope.  All other systems reviewed and are negative.   Physical Exam Updated Vital Signs BP 119/72   Pulse (!) 101   Temp 98.3 F (36.8 C) (Oral)   Resp (!) 21   SpO2 93%  Physical Exam Vitals and nursing note reviewed.  Constitutional:      General: He is not in acute distress.    Appearance: He is well-developed.  HENT:     Head: Normocephalic and atraumatic.  Eyes:     Conjunctiva/sclera: Conjunctivae  normal.  Cardiovascular:     Rate and Rhythm: Normal rate and regular rhythm.     Heart sounds: No murmur heard. Pulmonary:     Effort: Pulmonary effort is normal. No respiratory distress.     Breath sounds: Wheezing and rhonchi present.  Abdominal:     Palpations: Abdomen is soft.     Tenderness: There is no abdominal tenderness.  Musculoskeletal:        General: No swelling.     Cervical back: Neck supple.  Skin:    General: Skin is warm and dry.     Capillary Refill: Capillary refill takes less than 2 seconds.  Neurological:     Mental Status: He is alert.  Psychiatric:        Mood and Affect: Mood normal.      ED Course/ Medical Decision Making/ A&P    Procedures Procedures   Medications Ordered in ED Medications  levalbuterol  (XOPENEX ) nebulizer solution 1.25 mg (1.25 mg Nebulization Given 07/12/23 1503)  ipratropium (ATROVENT ) nebulizer solution 0.5 mg (0.5 mg Nebulization Given 07/12/23 1503)  methylPREDNISolone  sodium succinate (SOLU-MEDROL ) 125 mg/2 mL injection 125 mg (125 mg Intravenous Given 07/12/23 1623)  albuterol  (PROVENTIL ) (2.5 MG/3ML) 0.083% nebulizer solution 2.5 mg (2.5 mg Nebulization Given 07/12/23 2108)   Medical Decision Making:   OWAIN ECKERMAN is a 78 y.o. male with a history of COPD, who presented to the ED today with acute on chronic SOB. They are endorsing worsening of their baseline dyspnea  over the past 72 hours. Their baseline is a 0L O2 requirement. At their baseline they are able to get around the neighborhood and they are not able to at this time.   On my initial exam, the pt was SOB and tachypneic. Audible wheezing and grossly decreased breath sounds appreciated.  They are endorsing increased sputum production.    Reviewed and confirmed nursing documentation for past medical history, family history, social history.    Initial Assessment:   With the patient's presentation of SOB in the above setting, most likely diagnosis is COPD  Exacerbation. Other diagnoses were considered including (but not limited to) CAP, PE, ACS, viral infection, PTX. These are considered less likely due to history of present illness and physical exam findings.   This is most consistent with an acute life/limb threatening illness complicated by underlying chronic conditions.  Initial Plan:  Empiric treatment of patient's symptoms with immediate initiation of inhaled bronchodilators and IV steroids.   Evaluation for sepsis with lactic acid and BC Evaluation for infectious versus intrathoracic abnormality with chest x-ray  Screening labs including CBC and Metabolic panel to evaluate for infectious or metabolic etiology of disease.  Patient's Wells score is low and patient does not warrant further objective evaluation for PE based on consistency of presentation of alternative diagnosis.  Viral screen Objective evaluation as below reviewed   Initial Study Results:   Laboratory  All laboratory results reviewed without evidence of clinically relevant pathology.    EKG EKG was reviewed independently. Rate, rhythm, axis, intervals all examined and without medically relevant abnormality. ST segments without concerns for elevations.    Radiology:  All images reviewed independently. Agree with radiology report at this time.   DG Chest 2 View Result Date: 07/12/2023 CLINICAL DATA:  Recently treated for pneumonia. Hypotension with increased lethargy and weakness EXAM: CHEST - 2 VIEW COMPARISON:  Chest radiograph dated 06/17/2023 FINDINGS: Normal lung volumes. Markedly decreased bilateral airspace opacities with residual patchy opacity of the right lung base. No pleural effusion or pneumothorax. Similar cardiomediastinal silhouette. No acute osseous abnormality. IMPRESSION: Markedly decreased bilateral airspace opacities with residual patchy opacity of the right lung base, likely resolving pneumonia. Electronically Signed   By: Limin  Xu M.D.   On: 07/12/2023  18:43   CT CHEST WO CONTRAST Result Date: 06/17/2023 CLINICAL DATA:  Cough. Failed inferior treatment for multifocal pneumonia. 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 EXAM: CT CHEST WITHOUT CONTRAST TECHNIQUE: Multidetector CT imaging of the chest was performed following the standard protocol without IV contrast. RADIATION DOSE REDUCTION: This exam was performed according to the departmental dose-optimization program which includes automated exposure control, adjustment of the mA and/or kV according to patient size and/or use of iterative reconstruction technique. COMPARISON:  09/18/2022 FINDINGS: Cardiovascular: Heart is mildly enlarged. Scattered coronary artery and aortic atherosclerosis. Mediastinum/Nodes: No mediastinal, hilar, or axillary adenopathy. Trachea and esophagus are unremarkable. Thyroid  unremarkable. Lungs/Pleura: Biapical scarring. Peribronchovascular nodularity again noted throughout the lungs, most pronounced in the lower lobes where there is also bronchiectasis and mucous plugging, right greater than left. Findings are stable since prior study. No acute areas of consolidation. Trace right pleural effusion, new since prior study. Upper Abdomen: No acute findings Musculoskeletal: Chest wall soft tissues are unremarkable. No acute bony abnormality. IMPRESSION: Bronchiectasis with mucoid impaction and extensive peribronchovascular nodularity again noted in similar to prior study. Compatible with chronic MAC. Trace right pleural effusion. Coronary artery disease. Aortic Atherosclerosis (ICD10-I70.0). Electronically Signed   By: Franky Crease M.D.  On: 06/17/2023 21:32   DG Chest 2 View Result Date: 06/17/2023 CLINICAL DATA:  Confusion, weakness, recent pneumonia EXAM: CHEST - 2 VIEW COMPARISON:  04/26/2023 FINDINGS: Multifocal patchy opacities, right lower lobe predominant, suggesting multifocal pneumonia. Small right pleural effusion. No pneumothorax. The heart is normal in size. Visualized  osseous structures are within normal limits. IMPRESSION: Multifocal pneumonia, right lower lobe predominant. Small right pleural effusion. Electronically Signed   By: Pinkie Pebbles M.D.   On: 06/17/2023 17:13     Final Assessment and Plan:   After initiation of medical therapies, patient is grossly improved and no longer in acute distress.    Given the advanced nature of the patient's prenetation, patient will require advanced care and admission was arranged.      Clinical Impression:  1. SOB (shortness of breath)      Admit    Clinical Impression:  1. SOB (shortness of breath)      Admit   Final Clinical Impression(s) / ED Diagnoses Final diagnoses:  SOB (shortness of breath)    Rx / DC Orders ED Discharge Orders     None         Jerral Meth, MD 07/12/23 2257

## 2023-07-12 NOTE — ED Triage Notes (Signed)
 Hypotensive while at pulmonologist today. Recent admission for PNA and just finished antibiotics. Family reports increased lethargy and weakness over the last couple of days.

## 2023-07-12 NOTE — ED Notes (Signed)
 RT assessed in triage for SOB and decrease in oxygen saturation. Pt currently sating 91% on RA during assessment. Pt w/increased WOB at this time.

## 2023-07-13 ENCOUNTER — Encounter (HOSPITAL_COMMUNITY): Payer: Self-pay | Admitting: Internal Medicine

## 2023-07-13 DIAGNOSIS — Z1152 Encounter for screening for COVID-19: Secondary | ICD-10-CM | POA: Diagnosis not present

## 2023-07-13 DIAGNOSIS — J9601 Acute respiratory failure with hypoxia: Secondary | ICD-10-CM | POA: Diagnosis present

## 2023-07-13 DIAGNOSIS — E1142 Type 2 diabetes mellitus with diabetic polyneuropathy: Secondary | ICD-10-CM | POA: Diagnosis present

## 2023-07-13 DIAGNOSIS — Z87891 Personal history of nicotine dependence: Secondary | ICD-10-CM | POA: Diagnosis not present

## 2023-07-13 DIAGNOSIS — F411 Generalized anxiety disorder: Secondary | ICD-10-CM | POA: Diagnosis present

## 2023-07-13 DIAGNOSIS — I493 Ventricular premature depolarization: Secondary | ICD-10-CM | POA: Diagnosis present

## 2023-07-13 DIAGNOSIS — I5022 Chronic systolic (congestive) heart failure: Secondary | ICD-10-CM | POA: Diagnosis present

## 2023-07-13 DIAGNOSIS — R0602 Shortness of breath: Secondary | ICD-10-CM | POA: Diagnosis present

## 2023-07-13 DIAGNOSIS — I959 Hypotension, unspecified: Secondary | ICD-10-CM | POA: Diagnosis present

## 2023-07-13 DIAGNOSIS — Z794 Long term (current) use of insulin: Secondary | ICD-10-CM | POA: Diagnosis not present

## 2023-07-13 DIAGNOSIS — Z7901 Long term (current) use of anticoagulants: Secondary | ICD-10-CM | POA: Diagnosis not present

## 2023-07-13 DIAGNOSIS — E440 Moderate protein-calorie malnutrition: Secondary | ICD-10-CM | POA: Diagnosis present

## 2023-07-13 DIAGNOSIS — I480 Paroxysmal atrial fibrillation: Secondary | ICD-10-CM | POA: Diagnosis present

## 2023-07-13 DIAGNOSIS — Z8551 Personal history of malignant neoplasm of bladder: Secondary | ICD-10-CM | POA: Diagnosis not present

## 2023-07-13 DIAGNOSIS — J441 Chronic obstructive pulmonary disease with (acute) exacerbation: Secondary | ICD-10-CM | POA: Diagnosis present

## 2023-07-13 DIAGNOSIS — R9431 Abnormal electrocardiogram [ECG] [EKG]: Secondary | ICD-10-CM | POA: Diagnosis present

## 2023-07-13 DIAGNOSIS — I110 Hypertensive heart disease with heart failure: Secondary | ICD-10-CM | POA: Diagnosis present

## 2023-07-13 DIAGNOSIS — Z8249 Family history of ischemic heart disease and other diseases of the circulatory system: Secondary | ICD-10-CM | POA: Diagnosis not present

## 2023-07-13 DIAGNOSIS — E119 Type 2 diabetes mellitus without complications: Secondary | ICD-10-CM

## 2023-07-13 DIAGNOSIS — Z87442 Personal history of urinary calculi: Secondary | ICD-10-CM | POA: Diagnosis not present

## 2023-07-13 DIAGNOSIS — E1165 Type 2 diabetes mellitus with hyperglycemia: Secondary | ICD-10-CM | POA: Diagnosis present

## 2023-07-13 DIAGNOSIS — Z85828 Personal history of other malignant neoplasm of skin: Secondary | ICD-10-CM | POA: Diagnosis not present

## 2023-07-13 DIAGNOSIS — I700 Atherosclerosis of aorta: Secondary | ICD-10-CM | POA: Diagnosis present

## 2023-07-13 DIAGNOSIS — Z7984 Long term (current) use of oral hypoglycemic drugs: Secondary | ICD-10-CM | POA: Diagnosis not present

## 2023-07-13 DIAGNOSIS — J309 Allergic rhinitis, unspecified: Secondary | ICD-10-CM | POA: Diagnosis present

## 2023-07-13 DIAGNOSIS — K219 Gastro-esophageal reflux disease without esophagitis: Secondary | ICD-10-CM | POA: Diagnosis present

## 2023-07-13 LAB — BLOOD GAS, VENOUS
Acid-Base Excess: 3.4 mmol/L — ABNORMAL HIGH (ref 0.0–2.0)
Bicarbonate: 29.6 mmol/L — ABNORMAL HIGH (ref 20.0–28.0)
Drawn by: 7020
O2 Saturation: 71.6 %
Patient temperature: 36.3
pCO2, Ven: 49 mm[Hg] (ref 44–60)
pH, Ven: 7.39 (ref 7.25–7.43)
pO2, Ven: 41 mm[Hg] (ref 32–45)

## 2023-07-13 LAB — BRAIN NATRIURETIC PEPTIDE: B Natriuretic Peptide: 117.8 pg/mL — ABNORMAL HIGH (ref 0.0–100.0)

## 2023-07-13 LAB — POCT I-STAT EG7
Acid-Base Excess: 5 mmol/L — ABNORMAL HIGH (ref 0.0–2.0)
Bicarbonate: 32 mmol/L — ABNORMAL HIGH (ref 20.0–28.0)
Calcium, Ion: 1.23 mmol/L (ref 1.15–1.40)
HCT: 41 % (ref 39.0–52.0)
Hemoglobin: 13.9 g/dL (ref 13.0–17.0)
O2 Saturation: 53 %
Patient temperature: 98.7
Potassium: 4.1 mmol/L (ref 3.5–5.1)
Sodium: 134 mmol/L — ABNORMAL LOW (ref 135–145)
TCO2: 34 mmol/L — ABNORMAL HIGH (ref 22–32)
pCO2, Ven: 54.2 mm[Hg] (ref 44–60)
pH, Ven: 7.38 (ref 7.25–7.43)
pO2, Ven: 29 mm[Hg] — CL (ref 32–45)

## 2023-07-13 LAB — CBC WITH DIFFERENTIAL/PLATELET
Abs Immature Granulocytes: 0.15 10*3/uL — ABNORMAL HIGH (ref 0.00–0.07)
Basophils Absolute: 0 10*3/uL (ref 0.0–0.1)
Basophils Relative: 0 %
Eosinophils Absolute: 0 10*3/uL (ref 0.0–0.5)
Eosinophils Relative: 0 %
HCT: 39.9 % (ref 39.0–52.0)
Hemoglobin: 13 g/dL (ref 13.0–17.0)
Immature Granulocytes: 2 %
Lymphocytes Relative: 11 %
Lymphs Abs: 1 10*3/uL (ref 0.7–4.0)
MCH: 31 pg (ref 26.0–34.0)
MCHC: 32.6 g/dL (ref 30.0–36.0)
MCV: 95.2 fL (ref 80.0–100.0)
Monocytes Absolute: 0.3 10*3/uL (ref 0.1–1.0)
Monocytes Relative: 4 %
Neutro Abs: 7.5 10*3/uL (ref 1.7–7.7)
Neutrophils Relative %: 83 %
Platelets: 244 10*3/uL (ref 150–400)
RBC: 4.19 MIL/uL — ABNORMAL LOW (ref 4.22–5.81)
RDW: 14 % (ref 11.5–15.5)
WBC: 9 10*3/uL (ref 4.0–10.5)
nRBC: 0 % (ref 0.0–0.2)

## 2023-07-13 LAB — COMPREHENSIVE METABOLIC PANEL
ALT: 15 U/L (ref 0–44)
AST: 19 U/L (ref 15–41)
Albumin: 2.5 g/dL — ABNORMAL LOW (ref 3.5–5.0)
Alkaline Phosphatase: 64 U/L (ref 38–126)
Anion gap: 8 (ref 5–15)
BUN: 16 mg/dL (ref 8–23)
CO2: 26 mmol/L (ref 22–32)
Calcium: 9.1 mg/dL (ref 8.9–10.3)
Chloride: 101 mmol/L (ref 98–111)
Creatinine, Ser: 0.82 mg/dL (ref 0.61–1.24)
GFR, Estimated: >60 mL/min (ref >60)
Glucose, Bld: 311 mg/dL — ABNORMAL HIGH (ref 70–99)
Potassium: 4 mmol/L (ref 3.5–5.1)
Sodium: 135 mmol/L (ref 135–145)
Total Bilirubin: 0.4 mg/dL (ref 0.0–1.2)
Total Protein: 6.6 g/dL (ref 6.5–8.1)

## 2023-07-13 LAB — GLUCOSE, CAPILLARY
Glucose-Capillary: 233 mg/dL — ABNORMAL HIGH (ref 70–99)
Glucose-Capillary: 214 mg/dL — ABNORMAL HIGH (ref 70–99)
Glucose-Capillary: 191 mg/dL — ABNORMAL HIGH (ref 70–99)
Glucose-Capillary: 203 mg/dL — ABNORMAL HIGH (ref 70–99)

## 2023-07-13 LAB — CBG MONITORING, ED: Glucose-Capillary: 337 mg/dL — ABNORMAL HIGH (ref 70–99)

## 2023-07-13 LAB — MAGNESIUM
Magnesium: 1.9 mg/dL (ref 1.7–2.4)
Magnesium: 1.8 mg/dL (ref 1.7–2.4)

## 2023-07-13 LAB — PHOSPHORUS: Phosphorus: 2.2 mg/dL — ABNORMAL LOW (ref 2.5–4.6)

## 2023-07-13 LAB — PROCALCITONIN: Procalcitonin: <0.1 ng/mL

## 2023-07-13 MED ORDER — ADULT MULTIVITAMIN W/MINERALS CH
1.0000 | ORAL_TABLET | Freq: Every day | ORAL | Status: DC
  Administered 2023-07-13 – 2023-07-15 (×3): 1 via ORAL
  Filled 2023-07-13 (×3): qty 1

## 2023-07-13 MED ORDER — GUAIFENESIN ER 600 MG PO TB12
600.0000 mg | ORAL_TABLET | Freq: Two times a day (BID) | ORAL | Status: DC
  Administered 2023-07-13 – 2023-07-15 (×5): 600 mg via ORAL
  Filled 2023-07-13 (×5): qty 1

## 2023-07-13 MED ORDER — MELATONIN 3 MG PO TABS
3.0000 mg | ORAL_TABLET | Freq: Every evening | ORAL | Status: DC | PRN
Start: 2023-07-13 — End: 2023-07-15

## 2023-07-13 MED ORDER — INSULIN ASPART 100 UNIT/ML IJ SOLN
0.0000 [IU] | Freq: Three times a day (TID) | INTRAMUSCULAR | Status: DC
Start: 2023-07-13 — End: 2023-07-15
  Administered 2023-07-13: 3 [IU] via SUBCUTANEOUS
  Administered 2023-07-13: 2 [IU] via SUBCUTANEOUS
  Administered 2023-07-13: 3 [IU] via SUBCUTANEOUS
  Administered 2023-07-14: 1 [IU] via SUBCUTANEOUS
  Administered 2023-07-14: 2 [IU] via SUBCUTANEOUS
  Administered 2023-07-14 – 2023-07-15 (×2): 1 [IU] via SUBCUTANEOUS

## 2023-07-13 MED ORDER — DOXYCYCLINE HYCLATE 100 MG IV SOLR
100.0000 mg | Freq: Two times a day (BID) | INTRAVENOUS | Status: DC
  Administered 2023-07-13: 100 mg via INTRAVENOUS
  Filled 2023-07-13: qty 100

## 2023-07-13 MED ORDER — K PHOS MONO-SOD PHOS DI & MONO 155-852-130 MG PO TABS
250.0000 mg | ORAL_TABLET | Freq: Two times a day (BID) | ORAL | Status: AC
Start: 2023-07-13 — End: 2023-07-13
  Administered 2023-07-13 (×2): 250 mg via ORAL
  Filled 2023-07-13 (×2): qty 1

## 2023-07-13 MED ORDER — ENSURE ENLIVE PO LIQD
237.0000 mL | Freq: Two times a day (BID) | ORAL | Status: DC
  Administered 2023-07-14 – 2023-07-15 (×3): 237 mL via ORAL

## 2023-07-13 MED ORDER — ENSURE MAX PROTEIN PO LIQD
11.0000 [oz_av] | Freq: Two times a day (BID) | ORAL | Status: DC
  Administered 2023-07-13 – 2023-07-15 (×5): 11 [oz_av] via ORAL
  Filled 2023-07-13 (×6): qty 330

## 2023-07-13 MED ORDER — LEVALBUTEROL HCL 0.63 MG/3ML IN NEBU
0.6300 mg | INHALATION_SOLUTION | Freq: Three times a day (TID) | RESPIRATORY_TRACT | Status: DC
  Administered 2023-07-14 – 2023-07-15 (×5): 0.63 mg via RESPIRATORY_TRACT
  Filled 2023-07-13 (×5): qty 3

## 2023-07-13 MED ORDER — APIXABAN 5 MG PO TABS
5.0000 mg | ORAL_TABLET | Freq: Two times a day (BID) | ORAL | Status: DC
  Administered 2023-07-13 – 2023-07-15 (×6): 5 mg via ORAL
  Filled 2023-07-13 (×6): qty 1

## 2023-07-13 MED ORDER — TRIAMCINOLONE ACETONIDE 55 MCG/ACT NA AERO: 1.0000 | INHALATION_SPRAY | Freq: Every day | NASAL | Status: DC

## 2023-07-13 MED ORDER — ENSURE ENLIVE PO LIQD
237.0000 mL | Freq: Two times a day (BID) | ORAL | Status: DC
  Administered 2023-07-13: 237 mL via ORAL

## 2023-07-13 MED ORDER — ALPRAZOLAM 0.25 MG PO TABS
0.2500 mg | ORAL_TABLET | Freq: Two times a day (BID) | ORAL | Status: DC | PRN
  Administered 2023-07-13 – 2023-07-15 (×3): 0.25 mg via ORAL
  Filled 2023-07-13 (×3): qty 1

## 2023-07-13 MED ORDER — FLUTICASONE PROPIONATE 50 MCG/ACT NA SUSP
1.0000 | Freq: Every day | NASAL | Status: DC
Start: 2023-07-13 — End: 2023-07-15
  Administered 2023-07-13 – 2023-07-15 (×3): 1 via NASAL
  Filled 2023-07-13: qty 16

## 2023-07-13 MED ORDER — SODIUM CHLORIDE 3 % IN NEBU
4.0000 mL | INHALATION_SOLUTION | Freq: Two times a day (BID) | RESPIRATORY_TRACT | Status: AC
  Administered 2023-07-13 – 2023-07-15 (×5): 4 mL via RESPIRATORY_TRACT
  Filled 2023-07-13 (×5): qty 4

## 2023-07-13 MED ORDER — IPRATROPIUM BROMIDE 0.02 % IN SOLN
0.5000 mg | Freq: Four times a day (QID) | RESPIRATORY_TRACT | Status: DC
  Administered 2023-07-13 (×2): 0.5 mg via RESPIRATORY_TRACT
  Filled 2023-07-13 (×3): qty 2.5

## 2023-07-13 MED ORDER — DOXYCYCLINE HYCLATE 100 MG PO TABS
100.0000 mg | ORAL_TABLET | Freq: Two times a day (BID) | ORAL | Status: DC
  Administered 2023-07-13 – 2023-07-15 (×4): 100 mg via ORAL
  Filled 2023-07-13 (×5): qty 1

## 2023-07-13 MED ORDER — METHYLPREDNISOLONE SODIUM SUCC 125 MG IJ SOLR
80.0000 mg | Freq: Two times a day (BID) | INTRAMUSCULAR | Status: DC
  Administered 2023-07-13 – 2023-07-14 (×3): 80 mg via INTRAVENOUS
  Filled 2023-07-13 (×3): qty 2

## 2023-07-13 MED ORDER — LEVALBUTEROL HCL 1.25 MG/0.5ML IN NEBU
1.2500 mg | INHALATION_SOLUTION | Freq: Four times a day (QID) | RESPIRATORY_TRACT | Status: DC
  Administered 2023-07-13 (×4): 1.25 mg via RESPIRATORY_TRACT
  Filled 2023-07-13 (×4): qty 0.5

## 2023-07-13 MED ORDER — METOPROLOL SUCCINATE ER 25 MG PO TB24
25.0000 mg | ORAL_TABLET | Freq: Two times a day (BID) | ORAL | Status: DC
  Administered 2023-07-13 – 2023-07-15 (×5): 25 mg via ORAL
  Filled 2023-07-13 (×5): qty 1

## 2023-07-13 MED ORDER — POTASSIUM CHLORIDE CRYS ER 20 MEQ PO TBCR
40.0000 meq | EXTENDED_RELEASE_TABLET | Freq: Once | ORAL | Status: AC
  Administered 2023-07-13: 40 meq via ORAL
  Filled 2023-07-13: qty 2

## 2023-07-13 MED ORDER — ACETAMINOPHEN 325 MG PO TABS: 650.0000 mg | ORAL_TABLET | Freq: Four times a day (QID) | ORAL | Status: DC | PRN

## 2023-07-13 MED ORDER — MAGNESIUM OXIDE -MG SUPPLEMENT 400 (240 MG) MG PO TABS
400.0000 mg | ORAL_TABLET | Freq: Two times a day (BID) | ORAL | Status: DC
Start: 2023-07-13 — End: 2023-07-15
  Administered 2023-07-13 – 2023-07-15 (×5): 400 mg via ORAL
  Filled 2023-07-13 (×5): qty 1

## 2023-07-13 MED ORDER — ACETAMINOPHEN 650 MG RE SUPP: 650.0000 mg | Freq: Four times a day (QID) | RECTAL | Status: DC | PRN

## 2023-07-13 MED ORDER — ONDANSETRON HCL 4 MG/2ML IJ SOLN: 4.0000 mg | Freq: Four times a day (QID) | INTRAMUSCULAR | Status: DC | PRN

## 2023-07-13 MED ORDER — PANTOPRAZOLE SODIUM 40 MG PO TBEC
40.0000 mg | DELAYED_RELEASE_TABLET | Freq: Every day | ORAL | Status: DC
  Administered 2023-07-13 – 2023-07-15 (×3): 40 mg via ORAL
  Filled 2023-07-13 (×3): qty 1

## 2023-07-13 MED ORDER — LEVALBUTEROL HCL 1.25 MG/0.5ML IN NEBU: 1.2500 mg | INHALATION_SOLUTION | RESPIRATORY_TRACT | Status: DC | PRN

## 2023-07-13 MED ORDER — LEVALBUTEROL HCL 0.63 MG/3ML IN NEBU: 0.6300 mg | INHALATION_SOLUTION | Freq: Four times a day (QID) | RESPIRATORY_TRACT | Status: DC | PRN

## 2023-07-13 NOTE — ED Notes (Signed)
 Pt given crackers and gingerale.

## 2023-07-13 NOTE — Progress Notes (Addendum)
 Initial Nutrition Assessment  DOCUMENTATION CODES:   Non-severe (moderate) malnutrition in context of chronic illness  INTERVENTION:   -Ensure MAX Protein po BID, each supplement provides 150 kcal and 30 grams of protein   -Multivitamin with minerals daily  NUTRITION DIAGNOSIS:   Moderate Malnutrition related to chronic illness (COPD) as evidenced by mild fat depletion, mild muscle depletion, energy intake < or equal to 75% for > or equal to 1 month.  GOAL:   Patient will meet greater than or equal to 90% of their needs  MONITOR:   PO intake, Supplement acceptance, Labs, Weight trends, I & O's  REASON FOR ASSESSMENT:   Malnutrition Screening Tool    ASSESSMENT:   78 y.o. 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  Patient in room, reports poor appetite for months as his wife has been having health problems and he has also been in and out of the hospital. Has felt very nauseated and having headaches. Just recently discharged from [facility] on 12/13 for COPD exacerbation. Currently his wife is at [facility] and his son at [facility] for diverticulitis.  Pt reports he is trying to limit his carbohydrates with his meals as he has noticed his blood sugars are high. He has been on prednisone  lately. Pt open to receiving Ensure Max for additional protein. He ate 85% of his breakfast this morning.   Per weight records, pt has lost 11 lbs since 10/3 (6% wt loss x 3 months, insignificant for time frame).  Medications: MAG-OX, KLOR-CON , K-Phos  Labs reviewed: CBGs:  233-337  Low Phos  NUTRITION - FOCUSED PHYSICAL EXAM:  Flowsheet Row Most Recent Value  Orbital Region Mild depletion  Upper Arm Region Mild depletion  Thoracic and Lumbar Region Mild depletion  Buccal Region Mild depletion  Temple Region Moderate depletion  Clavicle Bone Region Mild depletion  Clavicle and Acromion Bone Region Mild depletion  Scapular Bone Region Moderate depletion  Dorsal Hand Mild depletion  Patellar Region Mild depletion  Anterior Thigh Region Mild depletion  Posterior Calf Region Mild depletion  Edema (RD Assessment) None  Hair Reviewed  Eyes Reviewed  Mouth Reviewed  Skin Reviewed  Nails Reviewed       Diet Order:   Diet Order             Diet regular Room service appropriate? Yes; Fluid consistency: Thin  Diet effective now                   EDUCATION NEEDS:   No education needs have been identified at this time  Skin:  Skin Assessment: Reviewed RN Assessment  Last BM:  1/2  Height:   Ht Readings from Last 1 Encounters:  07/13/23 5' 11 (1.803 m)    Weight:   Wt Readings from Last 1 Encounters:  07/13/23 67.5 kg    BMI:  Body mass index is 20.75 kg/m.  Estimated Nutritional Needs:   Kcal:  2000-2200  Protein:  95-105g  Fluid:  2L/day   Morna Lee, MS, RD, LDN Inpatient Clinical Dietitian Contact via Secure chat

## 2023-07-13 NOTE — H&P (Signed)
 History and Physical      Matthew Hall FMW:[id number] DOB: 07/18/1945 DOA: 07/12/2023; DOS: 07/13/2023  PCP: [name], MD  Patient coming from: home   I have personally briefly reviewed patient's old medical records in [facility]  Chief Complaint: Shortness of breath  HPI: Matthew Hall is a 78 y.o. male with medical history significant for COPD, bronchiectasis, type 2 diabetes mellitus, paroxysmal atrial fibrillation chronically anticoagulated on Eliquis , GAD, allergic rhinitis, chronic systolic heart failure, chronic leukocytosis with baseline white blood cell count of 10,000-23,000 who is admitted to [facility] on 07/12/2023 by way of transfer from [facility] with acute COPD exacerbation after presenting from home to the latter facility complaining of shortness of breath.   The patient was recently hospitalized in the [facility] health system from 06/17/2023 to 06/22/2023 for acute COPD exacerbation complicated by bronchiectasis and MAC.  He was subsequently discharged home on 06/22/2023, and reports that his breathing has been at baseline up until the last 3 to 4 days, he has developed progressive shortness of breath associated with new onset wheezing.  Reports that his shortness of breath is not been associate with any orthopnea, PND, or worsening of peripheral edema.  He denies any associated chest pain, palpitations, diaphoresis, nausea, vomiting notices Recently, or syncope.  No hemoptysis or new lower extremity erythema or calf tenderness.  He also denies any associated subjective fever, chills, rigors, or generalized myalgias.    He confirms no known baseline supplemental oxygen  requirements.  Reports good compliance with his outpatient respiratory regimen which includes scheduled Pulmicort  inhaler.  Does not appear to be on a long-acting beta-2 agonist or anticholinergic agent as an outpatient.  Medical history also notable for chronic leukocytosis, with  elevated white blood cell count dating back to January 2016, since which time will blood cell count has been in the range of 10,000-23,000.    [facility] ED Course:  Vital signs in the ED were notable for the following: Afebrile; heart rates in the 80s to low 100s; systolic blood pressures in the low 100s to 140s; respiratory rate 17-22, initial oxygen  saturation 89% on room air, Sosan improving into the range of 96 to 100% on 2 L nasal cannula.  Labs were notable for the following: CMP notable for the following: Potassium 3.6, bicarbonate 30, creatinine 0.71, glucose 117, calcium  adjusted for mild about anemia noted to be 9.3, avidin 3.1, otherwise liver enzymes are within normal limits.  Lactic acid level 1.3.  CBC notable for white cell count of 11,400 compared to most recent prior value of 12,700 on 06/17/2022, hemoglobin 13.3.  Urinalysis showed no white blood cells, no bacteria, leukocyte esterase/nitrate negative demonstrated no evidence of squamous of the will cells.  COVID, Valenza, RSV PCR all negative.  Blood cultures x 2 were collected.  Per my interpretation, EKG in ED demonstrated the following: Atrial fibrillation with multiple PVCs, rate 99, prolonged QTc of 503, no evidence of T wave or ST changes, Cleen evidence of ST ovation.  Imaging in the ED, per corresponding formal radiology read, was notable for the following: 2 view chest x-ray, in comparison to most recent prior chest x-ray from 06/17/2023 showed interval improvement of previously noted bilateral airspace opacities consistent with resolving pneumonia, while demonstrating no evidence of acute cardiopulmonary process, including no evidence of new infiltrate, edema, effusion, or pneumothorax.  While in the ED, the following were administered: Solu-Medrol  125 mg IV x 1 dose, Xopenex /ipratropium nebulizer x 1, butyryl nebulizer x 1.  Subsequently, the patient was admitted to [facility] for further evaluation and management of acute COPD  exacerbation complicated by acute Evoxac respiratory distress, with presenting workup also notable for QTc prolongation.    Review of Systems: As per HPI otherwise 10 point review of systems negative.   Past Medical History:  Diagnosis Date   Anxiety    Aortic atherosclerosis (HCC)    Atrial fibrillation (HCC)    Basal cell carcinoma    Bladder cancer [facility])    Bladder tumor    Colon polyps    COPD (chronic obstructive pulmonary disease) (HCC)    Cutaneous vasculitis    Diabetic peripheral neuropathy (HCC) 12/27/2020   Diverticulosis    Essential hypertension    GERD (gastroesophageal reflux disease)    History of pneumonia    Nephrolithiasis    Osteoarthritis    Right foot drop    Stroke [facility])     Past Surgical History:  Procedure Laterality Date   BRONCHIAL WASHINGS  06/19/2023   Procedure: BRONCHIAL WASHINGS;  Surgeon: Gretta Leita SQUIBB, DO;  Location: [facility] ENDOSCOPY;  Service: Cardiopulmonary;;   COLONOSCOPY     Kidney stones removed  1996   SKIN CANCER EXCISION Left    2003   TRANSURETHRAL RESECTION OF BLADDER TUMOR  1996   VIDEO BRONCHOSCOPY N/A 06/19/2023   Procedure: VIDEO BRONCHOSCOPY WITHOUT FLUORO;  Surgeon: Gretta Leita SQUIBB, DO;  Location: [facility] ENDOSCOPY;  Service: Cardiopulmonary;  Laterality: N/A;    Social History:  reports that he quit smoking about 12 years ago. His smoking use included cigarettes. He started smoking about 42 years ago. He has a 30 pack-year smoking history. He has never used smokeless tobacco. He reports that he does not drink alcohol  and does not use drugs.   Allergies  Allergen Reactions   Simvastatin Rash   Cortisone Nausea And Vomiting   Albuterol  Palpitations and Other (See Comments)    Pt is okay to take xopenex , albuterol  raises his heart rate   Augmentin [Amoxicillin-Pot Clavulanate] Rash   Ciprofloxacin Rash   Contrast Media [Iodinated Contrast Media] Rash   Flagyl  [Metronidazole ] Rash   Latex Hives and Rash   Sulfa  Antibiotics  Rash    Family History  Problem Relation Age of Onset   Diabetes Mother    CAD Mother    Emphysema Father    Diabetes Sister    Stroke Sister    Lung cancer Brother    Liver cancer Brother    Colon cancer Neg Hx     Family history reviewed and not pertinent    Prior to Admission medications   Medication Sig Start Date End Date Taking? Authorizing Provider  Acetaminophen  (TYLENOL ) 325 MG CAPS Take 1-2 capsules by mouth every 6 (six) hours as needed (pain).   Yes [provider]  ALPRAZolam  (XANAX ) 0.25 MG tablet Take 0.25 mg by mouth 2 (two) times daily as needed for anxiety or sleep.  07/01/14  Yes [provider]  apixaban  (ELIQUIS ) 5 MG TABS tablet Take 1 tablet (5 mg total) by mouth 2 (two) times daily. 04/17/23  Yes Perri DELENA Meliton Mickey., MD  benzonatate  (TESSALON ) 100 MG capsule Take 2 capsules (200 mg total) by mouth 3 (three) times daily as needed. 06/22/23  Yes Raenelle Coria, MD  budesonide  (PULMICORT ) 0.25 MG/2ML nebulizer solution Take 2 mLs (0.25 mg total) by nebulization 2 (two) times daily. 06/22/23  Yes Ghimire, Kuber, MD  clotrimazole-betamethasone (LOTRISONE) cream Apply 1 Application topically 2 (two) times daily. 04/04/23  Yes [provider]  Cyanocobalamin  (VITAMIN B12 PO) Take 1 tablet by mouth daily.   Yes [provider]  ELDERBERRY PO Take 1 tablet by mouth daily.   Yes [provider]  esomeprazole  (NEXIUM ) 40 MG capsule Take 40 mg by mouth daily at 12 noon.   Yes [provider]  guaiFENesin  (MUCINEX ) 600 MG 12 hr tablet Take 1 tablet (600 mg total) by mouth 2 (two) times daily. 06/22/23  Yes Raenelle Coria, MD  magnesium  oxide (MAG-OX) 400 (240 Mg) MG tablet Take 1 tablet (400 mg total) by mouth daily. 06/22/23 07/22/23 Yes Ghimire, Kuber, MD  metoprolol  succinate (TOPROL -XL) 25 MG 24 hr tablet TAKE 1 TABLET BY MOUTH TWICE A DAY Patient taking differently: Take 25-50 mg by mouth 2 (two) times daily.  Alternate taking 25mg  by mouth twice daily and 50mg  by mouth twice daily. 06/01/23  Yes Miriam Norris, NP  neomycin -polymyxin b-dexamethasone  (MAXITROL ) 3.5-10000-0.1 OINT Place 1 Application into both eyes at bedtime. 06/22/23  Yes Raenelle Coria, MD  sodium chloride  HYPERTONIC 3 % nebulizer solution Take 4 mLs by nebulization 2 (two) times daily. 06/22/23  Yes Raenelle Coria, MD  triamcinolone  (NASACORT ) 55 MCG/ACT AERO nasal inhaler Place 1 spray into the nose daily.   Yes [provider]  zinc  gluconate 50 MG tablet Take 50 mg by mouth 2 (two) times a week.   Yes [provider]  gabapentin  (NEURONTIN ) 100 MG capsule Take 3 capsules (300 mg total) by mouth at bedtime. Patient not taking: Reported on 07/12/2023 05/09/23   Onita Duos, MD  metFORMIN  (GLUCOPHAGE ) 500 MG tablet Take 1 tablet (500 mg total) by mouth daily with breakfast. Patient not taking: Reported on 07/12/2023 04/18/23   Perri DELENA Meliton Mickey., MD     Objective    Physical Exam: Vitals:   07/12/23 2245 07/13/23 0015 07/13/23 0030 07/13/23 0141  BP: 119/72 (!) 103/54 98/63 (!) 140/80  Pulse: (!) 101 85 90 87  Resp: (!) 21 (!) 22 (!) 21 18  Temp:    97.9 F (36.6 C)  TempSrc:      SpO2: 93% 96% 94% 100%  Weight:    67.5 kg  Height:    5' 11 (1.803 m)    General: appears to be stated age; alert, oriented; mildly increased work of breathing noted Skin: warm, dry, no rash Head:  AT/[place] Mouth:  Oral mucosa membranes appear moist, normal dentition Neck: supple; trachea midline Heart: Irregular; did not appreciate any M/R/G Lungs: CTAB, did not appreciate any wheezes, rales, or rhonchi Abdomen: + BS; soft, ND, NT Vascular: 2+ pedal pulses b/l; 2+ radial pulses b/l Extremities: no peripheral edema, no muscle wasting Neuro: strength and sensation intact in upper and lower extremities b/l    Labs on Admission: I have personally reviewed following labs and imaging studies  CBC: Recent Labs  Lab  07/12/23 1524 07/13/23 0059  WBC 11.4*  --   NEUTROABS 8.2*  --   HGB 13.3 13.9  HCT 40.2 41.0  MCV 92.0  --   PLT 272  --    Basic Metabolic Panel: Recent Labs  Lab 07/12/23 1524 07/13/23 0059  NA 134* 134*  K 3.6 4.1  CL 97*  --   CO2 30  --   GLUCOSE 117*  --   BUN 14  --   CREATININE 0.71  --   CALCIUM  8.6*  --    GFR: Estimated Creatinine Clearance: 71.5 mL/min (by C-G formula based on SCr  of 0.71 mg/dL). Liver Function Tests: Recent Labs  Lab 07/12/23 1524  AST 16  ALT 13  ALKPHOS 77  BILITOT 0.6  PROT 6.8  ALBUMIN 3.1*   No results for input(s): LIPASE, AMYLASE in the last 168 hours. No results for input(s): AMMONIA in the last 168 hours. Coagulation Profile: Recent Labs  Lab 07/12/23 1524  INR 1.8*   Cardiac Enzymes: No results for input(s): CKTOTAL, CKMB, CKMBINDEX, TROPONINI in the last 168 hours. BNP (last 3 results) No results for input(s): PROBNP in the last 8760 hours. HbA1C: No results for input(s): HGBA1C in the last 72 hours. CBG: No results for input(s): GLUCAP in the last 168 hours. Lipid Profile: No results for input(s): CHOL, HDL, LDLCALC, TRIG, CHOLHDL, LDLDIRECT in the last 72 hours. Thyroid  Function Tests: No results for input(s): TSH, T4TOTAL, FREET4, T3FREE, THYROIDAB in the last 72 hours. Anemia Panel: No results for input(s): VITAMINB12, FOLATE, FERRITIN, TIBC, IRON , RETICCTPCT in the last 72 hours. Urine analysis:    Component Value Date/Time   COLORURINE YELLOW 07/12/2023 1757   APPEARANCEUR CLEAR 07/12/2023 1757   LABSPEC 1.011 07/12/2023 1757   PHURINE 6.0 07/12/2023 1757   GLUCOSEU NEGATIVE 07/12/2023 1757   HGBUR SMALL (A) 07/12/2023 1757   BILIRUBINUR NEGATIVE 07/12/2023 1757   KETONESUR NEGATIVE 07/12/2023 1757   PROTEINUR NEGATIVE 07/12/2023 1757   NITRITE NEGATIVE 07/12/2023 1757   LEUKOCYTESUR NEGATIVE 07/12/2023 1757    Radiological Exams on  Admission: DG Chest 2 View Result Date: 07/12/2023 CLINICAL DATA:  Recently treated for pneumonia. Hypotension with increased lethargy and weakness EXAM: CHEST - 2 VIEW COMPARISON:  Chest radiograph dated 06/17/2023 FINDINGS: Normal lung volumes. Markedly decreased bilateral airspace opacities with residual patchy opacity of the right lung base. No pleural effusion or pneumothorax. Similar cardiomediastinal silhouette. No acute osseous abnormality. IMPRESSION: Markedly decreased bilateral airspace opacities with residual patchy opacity of the right lung base, likely resolving pneumonia. Electronically Signed   By: Limin  Xu M.D.   On: 07/12/2023 18:43      Assessment/Plan    Principal Problem:   COPD with acute exacerbation (HCC) Active Problems:   Allergic rhinitis   Leukocytosis   Acute respiratory failure with hypoxia (HCC)   Prolonged QT interval   DM2 (diabetes mellitus, type 2) (HCC)   Paroxysmal atrial fibrillation (HCC)   GAD (generalized anxiety disorder)   Chronic systolic CHF (congestive heart failure) (HCC)    #) Acute hypoxic respiratory distress due to acute COPD exacerbation: In the absence of any known baseline supplemental oxygen  requirements, initial oxygen  saturation noted to be 89% on room air, Sosan improving into the range of 96 to 100% on 2 L nasal cannula.  This appears to be on the basis of acute COPD exacerbation in the context of a documented history of COPD, with diagnosis of acute exacerbation on the basis of 3 to 4 days of progressive shortness of breath associated with wheezing, increased work of breathing.   Etiology for his exacerbation is not entirely clear at this time.  Chest x-ray shows no evidence of acute cardiopulmonary process, We will demonstrating interval improvement in previously noted bilateral airspace opacities from 12 8-24 consistent with resolving pneumonia.  No evidence of any new infiltrate, edema, effusion, or pneumothorax.  Overall, no  clinical or radiographic evidence to suggest pneumonia at this time, but will add on a procalcitonin level.  Of note, COVID, influenza, RSV PCR were all negative.  No clinical or radiographic evidence to suggest acutely decompensated heart  failure, but will also add on BNP level given his documented history of chronic systolic heart failure.  Additionally, ACS appears less likely in the absence of chest pain, with EKG showing no evidence of acute ischemic changes. Clinically, acute PE also appears to be less likely at this time, and rendered even less likely in the context of the compliance with chronic anticoagulation on Eliquis  at home  He reports good compliance with his outpatient respiratory regimen for COPD, as above, although this outpatient regimen does not appear to include a LABA or an anticholinergic agent.  In the setting of his history of bronchiectasis, he is also noted to be on hypertonic nebulizer treatments as an outpatient, which will be continued.  Confirms that he is a former smoker.  In the setting of borderline tachycardia along with history of paroxysmal atrial fibrillation, we will proceed with Xopenex  as preferred beta-2 agonist for now.   Plan: Monitor on telemetry. Solumedrol. Scheduled Xopenex /ipratropium nebulizers q6 hours. Prn Xopenex  nebulizer.  CMP in the morning. Repeat CBC in the morning. Check serum Mg and Phos levels. Will attempt additional chart review to evaluate most recent PFT results. Will start doxycycline  for benefit of shortened duration of hospitalization associated with antibiotic initiation in the setting of acute COPD exacerbation, while noting QTc prolongation on presenting EKG. Check blood gas. Add-on procalcitonin.  Add on BNP.  Continue outpatient hypertonic nebulizers.  Incentive spirometry.                   #) QTc prolongation: Presenting EKG demonstrates QTc of 503 ms. he is at increased risk for further prolongation of his QTc  interval given plan for additional beta-2 agonist as component of management for his presenting acute COPD exacerbation.  Plan: Monitor on telemetry.  Add-on serum magnesium  level.  Repeat EKG in the morning to monitor interval degree of QTc prolongation.  Resume his metoprolol  succinate, which he takes on a twice daily basis.                  #) Type 2 Diabetes Mellitus: documented history of such.  Appears to be managed via lifestyle modifications, the absence of any use of outpatient insulin  or any outpatient use of oral hypoglycemic agents.  Most recent hemoglobin A1c was found to be 6.9% when checked on 05/09/2023.  Presenting blood sugar 117.  In the context of plan for systemic corticosteroids for treatment of acute COPD exacerbation, will closely monitor ensuing trend in blood sugar, as further outlined below.  Plan: accuchecks QAC and HS with low dose SSI.                     #) Paroxysmal atrial fibrillation: Documented history of such. In setting of CHA2DS2-VASc score of  4, there is an indication for chronic anticoagulation for thromboembolic prophylaxis. Consistent with this, patient is chronically anticoagulated on Eliquis . Home AV nodal blocking regimen: Metoprolol  succinate, which she takes on a twice daily basis.  Most recent echocardiogram was performed on 04/13/2023 and notable for LVEF 40 to 45%, as well as severely dilated left atrium and moderate tricuspid regurgitation, moderat. Presenting EKG reflects rate controlled atrial fibrillation without overt evidence of acute ischemic changes.  In the setting of presenting potassium level of 3.6, with anticipation of interval decline in this value due to shifting effects of interval beta-2 agonist, will provide a dose of oral potassium supplementation at this time, quantified below.  Plan: monitor strict I's & O's and  daily weights. CMP/CBC in AM. Check serum mag level. Continue home AV nodal blocking  regimen.  Continue outpatient Eliquis .  Monitor on telemetry.  Potassium chloride  40 mill colons p.o. x 1 dose now.                    #) Chronic systolic heart failure: documented history of such, with most recent echocardiogram performed 04/13/2023, notable for LVEF 40 to 45%, indeterminate diastolic parameters and normal right ventricular systolic function. No clinical or radiographic evidence to suggest acutely decompensated heart failure at this time. home diuretic regimen reportedly consists of the following: None. Home cardiac medications also include the following: Metoprolol  succinate, which he takes on a twice daily basis at home..   Plan: monitor strict I's & O's and daily weights. Repeat CMP in AM. Check serum mag level.  New outpatient metoprolol  succinate, which she takes on a twice daily basis.  Add on BNP.                  #) Generalized anxiety disorder: documented h/o such. On prn Xanax  as outpatient, in the absence of any use of outpatient SSRI or SNRI.  Plan: Continue outpatient prn Xanax .                  #) Allergic Rhinitis: documented h/o such, on scheduled intranasal Nasacort  as outpatient.    Plan: cont home Nasacort .                   #) Chronic leukocytosis: Since January 2016, every white blood cell count data point has been in the range of 10,000-23,000, with presenting with blood cell count consistent with this baseline range, and trending down relative to most recent prior will with cell count drawn on 06/18/2023, as further quantified above.  No evidence of acute infection at this time, including urinalysis that is inconsistent with UTI, will COVID, influenza, RSV PCR are all negative, chest x-ray shows no evidence of acute cardiopulmonary process, including no evidence of infiltrate, will demonstrating interval improvement of previously noted airspace opacities, as further detailed above.  Anticipate  interval increase in white blood cell count relative to presenting value in the setting of plan for interval systemic corticosteroids as component of management of presenting acute COPD exacerbation.  Plan: Repeat CBC in the morning.    DVT prophylaxis: SCD's plus continuation of home Eliquis  Code Status: Full code Family Communication: none Disposition Plan: Per Rounding Team Consults called: none;  Admission status: Inpatient    I SPENT GREATER THAN 75  MINUTES IN CLINICAL CARE TIME/MEDICAL DECISION-MAKING IN COMPLETING THIS ADMISSION.     Luccia Reinheimer B Orel Hord DO [facility] From 7PM - 7AM   07/13/2023, 1:58 AM

## 2023-07-13 NOTE — ED Notes (Signed)
 ED TO INPATIENT HANDOFF REPORT  ED Nurse Name and Phone #: leonor dollar [phone]  S Name/Age/Gender Matthew Hall 78 y.o. male Room/Bed: DB008/DB008  Code Status   Code Status: Prior  Home/SNF/Other Home Patient oriented to: self, place, and time Is this baseline? Yes   Triage Complete: Triage complete  Chief Complaint Acute respiratory failure with hypoxia (HCC) [J96.01]  Triage Note Hypotensive while at pulmonologist today. Recent admission for PNA and just finished antibiotics. Family reports increased lethargy and weakness over the last couple of days.     Allergies Allergies  Allergen Reactions   Simvastatin Rash   Cortisone Nausea And Vomiting   Albuterol  Palpitations and Other (See Comments)    Pt is okay to take xopenex , albuterol  raises his heart rate   Augmentin [Amoxicillin-Pot Clavulanate] Rash   Ciprofloxacin Rash   Contrast Media [Iodinated Contrast Media] Rash   Flagyl  [Metronidazole ] Rash   Latex Hives and Rash   Sulfa  Antibiotics Rash    Level of Care/Admitting Diagnosis ED Disposition     ED Disposition  Admit   Condition  --   Comment  Hospital Area: [facility] [[id number]]  Level of Care: Telemetry [5]  Admit to tele based on following criteria: Monitor for Ischemic changes  May admit patient to [facility] or [facility] if equivalent level of care is available:: Yes  Interfacility transfer: Yes  Covid Evaluation: Confirmed COVID Negative  Diagnosis: Acute respiratory failure with hypoxia [facility]) [[id number]]  Admitting Physician: FRANKY REDIA SAILOR [phone]  Attending Physician: FRANKY REDIA SAILOR [phone]  Certification:: I certify this patient will need inpatient services for at least 2 midnights  Expected Medical Readiness: 07/14/2023          B Medical/Surgery History Past Medical History:  Diagnosis Date   Anxiety    Aortic atherosclerosis (HCC)    Atrial fibrillation (HCC)    Basal cell carcinoma     Bladder cancer (HCC)    Bladder tumor    Colon polyps    COPD (chronic obstructive pulmonary disease) (HCC)    Cutaneous vasculitis    Diabetic peripheral neuropathy (HCC) 12/27/2020   Diverticulosis    Essential hypertension    GERD (gastroesophageal reflux disease)    History of pneumonia    Nephrolithiasis    Osteoarthritis    Right foot drop    Stroke [facility])    Past Surgical History:  Procedure Laterality Date   BRONCHIAL WASHINGS  06/19/2023   Procedure: BRONCHIAL WASHINGS;  Surgeon: Gretta Leita SQUIBB, DO;  Location: [facility] ENDOSCOPY;  Service: Cardiopulmonary;;   COLONOSCOPY     Kidney stones removed  1996   SKIN CANCER EXCISION Left    2003   TRANSURETHRAL RESECTION OF BLADDER TUMOR  1996   VIDEO BRONCHOSCOPY N/A 06/19/2023   Procedure: VIDEO BRONCHOSCOPY WITHOUT FLUORO;  Surgeon: Gretta Leita SQUIBB, DO;  Location: [facility] ENDOSCOPY;  Service: Cardiopulmonary;  Laterality: N/A;     A IV Location/Drains/Wounds Patient Lines/Drains/Airways Status     Active Line/Drains/Airways     Name Placement date Placement time Site Days   Peripheral IV 07/12/23 20 G Anterior;Distal;Left;Upper Arm 07/12/23  1519  Arm  1            Intake/Output Last 24 hours No intake or output data in the 24 hours ending 07/13/23 0011  Labs/Imaging Results for orders placed or performed during the hospital encounter of 07/12/23 (from the past 48 hours)  Comprehensive metabolic panel     Status: Abnormal  Collection Time: 07/12/23  3:24 PM  Result Value Ref Range   Sodium 134 (L) 135 - 145 mmol/L   Potassium 3.6 3.5 - 5.1 mmol/L   Chloride 97 (L) 98 - 111 mmol/L   CO2 30 22 - 32 mmol/L   Glucose, Bld 117 (H) 70 - 99 mg/dL    Comment: Glucose reference range applies only to samples taken after fasting for at least 8 hours.   BUN 14 8 - 23 mg/dL   Creatinine, Ser 9.28 0.61 - 1.24 mg/dL   Calcium  8.6 (L) 8.9 - 10.3 mg/dL   Total Protein 6.8 6.5 - 8.1 g/dL   Albumin 3.1 (L) 3.5 - 5.0 g/dL   AST 16  15 - 41 U/L   ALT 13 0 - 44 U/L   Alkaline Phosphatase 77 38 - 126 U/L   Total Bilirubin 0.6 0.0 - 1.2 mg/dL   GFR, Estimated >39 >39 mL/min    Comment: (NOTE) Calculated using the CKD-EPI Creatinine Equation (2021)    Anion gap 7 5 - 15    Comment: Performed at [facility], [address] [place], [place] [address]  CBC with Differential     Status: Abnormal   Collection Time: 07/12/23  3:24 PM  Result Value Ref Range   WBC 11.4 (H) 4.0 - 10.5 K/uL   RBC 4.37 4.22 - 5.81 MIL/uL   Hemoglobin 13.3 13.0 - 17.0 g/dL   HCT 59.7 60.9 - 47.9 %   MCV 92.0 80.0 - 100.0 fL   MCH 30.4 26.0 - 34.0 pg   MCHC 33.1 30.0 - 36.0 g/dL   RDW 86.0 88.4 - 84.4 %   Platelets 272 150 - 400 K/uL   nRBC 0.0 0.0 - 0.2 %   Neutrophils Relative % 71 %   Neutro Abs 8.2 (H) 1.7 - 7.7 K/uL   Lymphocytes Relative 13 %   Lymphs Abs 1.4 0.7 - 4.0 K/uL   Monocytes Relative 14 %   Monocytes Absolute 1.6 (H) 0.1 - 1.0 K/uL   Eosinophils Relative 1 %   Eosinophils Absolute 0.1 0.0 - 0.5 K/uL   Basophils Relative 0 %   Basophils Absolute 0.1 0.0 - 0.1 K/uL   Immature Granulocytes 1 %   Abs Immature Granulocytes 0.08 (H) 0.00 - 0.07 K/uL    Comment: Performed at [facility], [address] [place], [place] [address]  Protime-INR     Status: Abnormal   Collection Time: 07/12/23  3:24 PM  Result Value Ref Range   Prothrombin Time 21.3 (H) 11.4 - 15.2 seconds   INR 1.8 (H) 0.8 - 1.2    Comment: (NOTE) INR goal varies based on device and disease states. Performed at [facility], [address], [place], [place] [address]   Lactic acid, plasma     Status: None   Collection Time: 07/12/23  3:25 PM  Result Value Ref Range   Lactic Acid, Venous 1.3 0.5 - 1.9 mmol/L    Comment: Performed at [facility], [address], [place], [place] [address]  Resp panel by RT-PCR (RSV, Flu A&B, Covid) Anterior Nasal Swab     Status: None    Collection Time: 07/12/23  3:43 PM   Specimen: Anterior Nasal Swab  Result Value Ref Range   SARS Coronavirus 2 by RT PCR NEGATIVE NEGATIVE    Comment: (NOTE) SARS-CoV-2 target nucleic acids are NOT DETECTED.  The SARS-CoV-2 RNA is generally detectable in upper respiratory specimens during the  acute phase of infection. The lowest concentration of SARS-CoV-2 viral copies this assay can detect is 138 copies/mL. A negative result does not preclude SARS-Cov-2 infection and should not be used as the sole basis for treatment or other patient management decisions. A negative result may occur with  improper specimen collection/handling, submission of specimen other than nasopharyngeal swab, presence of viral mutation(s) within the areas targeted by this assay, and inadequate number of viral copies(<138 copies/mL). A negative result must be combined with clinical observations, patient history, and epidemiological information. The expected result is Negative.  Fact Sheet for Patients:  bloggercourse.com  Fact Sheet for Healthcare Providers:  seriousbroker.it  This test is no t yet approved or cleared by the [place]  FDA and  has been authorized for detection and/or diagnosis of SARS-CoV-2 by FDA under an Emergency Use Authorization (EUA). This EUA will remain  in effect (meaning this test can be used) for the duration of the COVID-19 declaration under Section 564(b)(1) of the Act, 21 U.S.C.section 360bbb-3(b)(1), unless the authorization is terminated  or revoked sooner.       Influenza A by PCR NEGATIVE NEGATIVE   Influenza B by PCR NEGATIVE NEGATIVE    Comment: (NOTE) The Xpert Xpress SARS-CoV-2/FLU/RSV plus assay is intended as an aid in the diagnosis of influenza from Nasopharyngeal swab specimens and should not be used as a sole basis for treatment. Nasal washings and aspirates are unacceptable for Xpert Xpress  SARS-CoV-2/FLU/RSV testing.  Fact Sheet for Patients: bloggercourse.com  Fact Sheet for Healthcare Providers: seriousbroker.it  This test is not yet approved or cleared by the [place]  FDA and has been authorized for detection and/or diagnosis of SARS-CoV-2 by FDA under an Emergency Use Authorization (EUA). This EUA will remain in effect (meaning this test can be used) for the duration of the COVID-19 declaration under Section 564(b)(1) of the Act, 21 U.S.C. section 360bbb-3(b)(1), unless the authorization is terminated or revoked.     Resp Syncytial Virus by PCR NEGATIVE NEGATIVE    Comment: (NOTE) Fact Sheet for Patients: bloggercourse.com  Fact Sheet for Healthcare Providers: seriousbroker.it  This test is not yet approved or cleared by the [place]  FDA and has been authorized for detection and/or diagnosis of SARS-CoV-2 by FDA under an Emergency Use Authorization (EUA). This EUA will remain in effect (meaning this test can be used) for the duration of the COVID-19 declaration under Section 564(b)(1) of the Act, 21 U.S.C. section 360bbb-3(b)(1), unless the authorization is terminated or revoked.  Performed at [facility], [address] [place], [place] [address]   Urinalysis, w/ Reflex to Culture (Infection Suspected) -Urine, Clean Catch     Status: Abnormal   Collection Time: 07/12/23  5:57 PM  Result Value Ref Range   Specimen Source URINE, CATHETERIZED    Color, Urine YELLOW YELLOW   APPearance CLEAR CLEAR   Specific Gravity, Urine 1.011 1.005 - 1.030   pH 6.0 5.0 - 8.0   Glucose, UA NEGATIVE NEGATIVE mg/dL   Hgb urine dipstick SMALL (A) NEGATIVE   Bilirubin Urine NEGATIVE NEGATIVE   Ketones, ur NEGATIVE NEGATIVE mg/dL   Protein, ur NEGATIVE NEGATIVE mg/dL   Nitrite NEGATIVE NEGATIVE   Leukocytes,Ua NEGATIVE NEGATIVE   RBC / HPF  6-10 0 - 5 RBC/hpf   WBC, UA 0-5 0 - 5 WBC/hpf    Comment:        Reflex urine culture not performed if WBC <=10, OR if Squamous epithelial cells >5. If Squamous epithelial cells >  5 suggest recollection.    Bacteria, UA NONE SEEN NONE SEEN   Squamous Epithelial / HPF 0-5 0 - 5 /HPF   Mucus PRESENT     Comment: Performed at [facility], [address], [place], [place] [address]   DG Chest 2 View Result Date: 07/12/2023 CLINICAL DATA:  Recently treated for pneumonia. Hypotension with increased lethargy and weakness EXAM: CHEST - 2 VIEW COMPARISON:  Chest radiograph dated 06/17/2023 FINDINGS: Normal lung volumes. Markedly decreased bilateral airspace opacities with residual patchy opacity of the right lung base. No pleural effusion or pneumothorax. Similar cardiomediastinal silhouette. No acute osseous abnormality. IMPRESSION: Markedly decreased bilateral airspace opacities with residual patchy opacity of the right lung base, likely resolving pneumonia. Electronically Signed   By: Limin  Xu M.D.   On: 07/12/2023 18:43    Pending Labs Unresulted Labs (From admission, onward)     Start     Ordered   07/12/23 1443  Lactic acid, plasma  Now then every 2 hours,   R (with STAT occurrences)      07/12/23 1442   07/12/23 1443  Culture, blood (Routine x 2)  BLOOD CULTURE X 2,   STAT      07/12/23 1442            Vitals/Pain Today's Vitals   07/12/23 2110 07/12/23 2115 07/12/23 2140 07/12/23 2245  BP:  127/74  119/72  Pulse:  91  (!) 101  Resp:  (!) 25  (!) 21  Temp:   98.3 F (36.8 C)   TempSrc:   Oral   SpO2: 98% 100%  93%  PainSc:        Isolation Precautions No active isolations  Medications Medications  levalbuterol  (XOPENEX ) nebulizer solution 1.25 mg (1.25 mg Nebulization Given 07/12/23 1503)  ipratropium (ATROVENT ) nebulizer solution 0.5 mg (0.5 mg Nebulization Given 07/12/23 1503)  methylPREDNISolone  sodium succinate (SOLU-MEDROL ) 125 mg/2 mL injection  125 mg (125 mg Intravenous Given 07/12/23 1623)  albuterol  (PROVENTIL ) (2.5 MG/3ML) 0.083% nebulizer solution 2.5 mg (2.5 mg Nebulization Given 07/12/23 2108)    Mobility walks with person assist     Focused Assessments Pulmonary Assessment Handoff:  Lung sounds: Bilateral Breath Sounds: Diminished O2 Device: Nasal Cannula O2 Flow Rate (L/min): 2 L/min    R Recommendations: See Admitting Provider Note  Report given to:   Additional Notes:  Call daughter edwardo)  for updates- # in chart

## 2023-07-13 NOTE — Progress Notes (Signed)
 1900: received pt lying in bed, A&O x 4, VSS, on 2L O2/[place], denies sob. Pt still with coughing and dyspnea on exertion noted. Medications given as ordered- see emar.   Educated on falls and safety precautions, plan of care, call bell- pt verbalizes understanding.  Bed alarm on. Call bell in reach. Will continue to monitor.

## 2023-07-13 NOTE — Progress Notes (Signed)
 Admission notes: 0125: new pt came from [facility] ED, Pt is A&O x 4. VSS, continues on 2L O2/[place],  Dyspnea on exertion and coughing noted. Pt denies sob.  Educated pt on plan of care, falls and safety precautions, call  bell, pt verbalizes understanding.  Dr. Marcene notified on patient arrival to unit.   2 RN skin assessment done with charge RN- Hiem Nivia

## 2023-07-13 NOTE — Plan of Care (Signed)
 Pt continues on 2L O2/[place], denies sob. A.fib on the tele monitor. Using urinals at bedside.  Safety maintained. Bed alarm on. Call bell in reach. Will continue to monitor.   Problem: Education: Goal: Knowledge of General Education information will improve Description: Including pain rating scale, medication(s)/side effects and non-pharmacologic comfort measures Outcome: Progressing   Problem: Health Behavior/Discharge Planning: Goal: Ability to manage health-related needs will improve Outcome: Progressing   Problem: Clinical Measurements: Goal: Ability to maintain clinical measurements within normal limits will improve Outcome: Progressing Goal: Will remain free from infection Outcome: Progressing Goal: Diagnostic test results will improve Outcome: Progressing Goal: Respiratory complications will improve Outcome: Progressing Goal: Cardiovascular complication will be avoided Outcome: Progressing   Problem: Activity: Goal: Risk for activity intolerance will decrease Outcome: Progressing   Problem: Nutrition: Goal: Adequate nutrition will be maintained Outcome: Progressing   Problem: Coping: Goal: Level of anxiety will decrease Outcome: Progressing   Problem: Elimination: Goal: Will not experience complications related to bowel motility Outcome: Progressing Goal: Will not experience complications related to urinary retention Outcome: Progressing   Problem: Pain Management: Goal: General experience of comfort will improve Outcome: Progressing   Problem: Safety: Goal: Ability to remain free from injury will improve Outcome: Progressing   Problem: Skin Integrity: Goal: Risk for impaired skin integrity will decrease Outcome: Progressing   Problem: Education: Goal: Ability to describe self-care measures that may prevent or decrease complications (Diabetes Survival Skills Education) will improve Outcome: Progressing Goal: Individualized Educational Video(s) Outcome:  Progressing   Problem: Coping: Goal: Ability to adjust to condition or change in health will improve Outcome: Progressing   Problem: Fluid Volume: Goal: Ability to maintain a balanced intake and output will improve Outcome: Progressing   Problem: Health Behavior/Discharge Planning: Goal: Ability to identify and utilize available resources and services will improve Outcome: Progressing Goal: Ability to manage health-related needs will improve Outcome: Progressing   Problem: Metabolic: Goal: Ability to maintain appropriate glucose levels will improve Outcome: Progressing   Problem: Nutritional: Goal: Maintenance of adequate nutrition will improve Outcome: Progressing Goal: Progress toward achieving an optimal weight will improve Outcome: Progressing   Problem: Skin Integrity: Goal: Risk for impaired skin integrity will decrease Outcome: Progressing   Problem: Tissue Perfusion: Goal: Adequacy of tissue perfusion will improve Outcome: Progressing

## 2023-07-13 NOTE — Progress Notes (Addendum)
 Same day note  Patient seen and examined at bedside.  Patient was admitted to the hospital for shortness of breath.  At the time of my evaluation, patient complains of shortness of breath  Physical examination reveals decreased breath sounds bilaterally, on nasal cannula oxygen .  Laboratory data and imaging was reviewed  Assessment and Plan.  Acute hypoxic respiratory failure secondary to COPD exacerbation.   COVID influenza and RSV was negative.  Continue oxygen  nebulizers.  Chest x-ray with improving infiltrates compared to previous.  Continue Solu-Medrol .  Empiric doxycycline .    On nasal cannula 2 L/min.  Will continue to wean as able.  Lactate was negative.  Blood cultures negative in less than 12 hours.  Might need oxygen  on discharge.  Will assess for oxygen  need tomorrow.  Type 2 diabetes mellitus with hyperglycemia..  Continue sliding scale insulin  especially on steroids..  Paroxysmal atrial fibrillation.  Continue Eliquis , metoprolol .  Rate controlled at this time.  Chronic systolic heart failure.  Last known LV ejection fraction of 40 to 45%.  Continue metoprolol .  Generalized anxiety disorder.  On as needed Xanax  as outpatient.  Allergic rhinitis.  Continue Nasacort .  Hypophosphatemia.  Will replace orally.  Phosphorus of 2.2 at this time.  leukocytosis.  Monitor.  WBC improved today.  I tried to reach out to the patient's daughter on the phone but was unable to reach her today.  No Charge  Signed,  Vernal Anselm Alstrom, MD [facility]

## 2023-07-14 DIAGNOSIS — J441 Chronic obstructive pulmonary disease with (acute) exacerbation: Secondary | ICD-10-CM

## 2023-07-14 LAB — BASIC METABOLIC PANEL
Anion gap: 8 (ref 5–15)
BUN: 27 mg/dL — ABNORMAL HIGH (ref 8–23)
CO2: 28 mmol/L (ref 22–32)
Calcium: 9 mg/dL (ref 8.9–10.3)
Chloride: 101 mmol/L (ref 98–111)
Creatinine, Ser: 0.73 mg/dL (ref 0.61–1.24)
GFR, Estimated: >60 mL/min (ref >60)
Glucose, Bld: 143 mg/dL — ABNORMAL HIGH (ref 70–99)
Potassium: 4.4 mmol/L (ref 3.5–5.1)
Sodium: 137 mmol/L (ref 135–145)

## 2023-07-14 LAB — MAGNESIUM: Magnesium: 2 mg/dL (ref 1.7–2.4)

## 2023-07-14 LAB — CBC
HCT: 38.8 % — ABNORMAL LOW (ref 39.0–52.0)
Hemoglobin: 12.1 g/dL — ABNORMAL LOW (ref 13.0–17.0)
MCH: 30.2 pg (ref 26.0–34.0)
MCHC: 31.2 g/dL (ref 30.0–36.0)
MCV: 96.8 fL (ref 80.0–100.0)
Platelets: 288 10*3/uL (ref 150–400)
RBC: 4.01 MIL/uL — ABNORMAL LOW (ref 4.22–5.81)
RDW: 14.2 % (ref 11.5–15.5)
WBC: 27.2 10*3/uL — ABNORMAL HIGH (ref 4.0–10.5)
nRBC: 0 % (ref 0.0–0.2)

## 2023-07-14 LAB — GLUCOSE, CAPILLARY
Glucose-Capillary: 141 mg/dL — ABNORMAL HIGH (ref 70–99)
Glucose-Capillary: 132 mg/dL — ABNORMAL HIGH (ref 70–99)
Glucose-Capillary: 183 mg/dL — ABNORMAL HIGH (ref 70–99)
Glucose-Capillary: 233 mg/dL — ABNORMAL HIGH (ref 70–99)

## 2023-07-14 LAB — PHOSPHORUS: Phosphorus: 3.1 mg/dL (ref 2.5–4.6)

## 2023-07-14 MED ORDER — METHYLPREDNISOLONE SODIUM SUCC 125 MG IJ SOLR
80.0000 mg | Freq: Every day | INTRAMUSCULAR | Status: DC
  Administered 2023-07-15: 80 mg via INTRAVENOUS
  Filled 2023-07-14: qty 2

## 2023-07-14 NOTE — Evaluation (Signed)
 Occupational Therapy Evaluation Patient Details Name: Matthew Hall MRN: [id number] DOB: June 15, 1946 Today's Date: 07/14/2023   History of Present Illness 78 years old male admitted on 07/12/23 for Acute hypoxic respiratory failure secondary to COPD exacerbation.  Recent admission 12/8/4 for multifocal pneumonia and sepsis.  PMHx significant for but not limited to COPD, chronic bronchiectasis, type 2 diabetes, paroxysmal atrial fibrillation on Eliquis , chronic systolic heart failure, chronic leukocytosis, neuropathy, CVA, Rt drop foot   Clinical Impression   PTA, pt living with daughter who assists with IADL. Upon eva, pt with decreased activity tolerance and balance. Needing up to CGA for Adl. Pt internally distracted by situation with wife being hospitalized as well during session but cognition appears [facility]. Pt with several comments about not taking medications during session because he does not think he needs them; encouraged pt to further discuss with prescribing providers prior to stopping any medications in effort to reduce hospital readmission rate. Currently recommending HHOT; pending progression may not need.       If plan is discharge home, recommend the following: A little help with walking and/or transfers;A little help with bathing/dressing/bathroom;Assistance with cooking/housework;Assist for transportation;Help with stairs or ramp for entrance    Functional Status Assessment  Patient has had a recent decline in their functional status and demonstrates the ability to make significant improvements in function in a reasonable and predictable amount of time.  Equipment Recommendations  None recommended by OT    Recommendations for Other Services       Precautions / Restrictions Precautions Precautions: Fall Precaution Comments: Rt drop foot Required Braces or Orthoses: Other Brace Other Brace: right ankle brace to assist with lifting shoe Restrictions Weight Bearing  Restrictions Per Provider Order: No      Mobility Bed Mobility Overal bed mobility: Modified Independent                  Transfers Overall transfer level: Needs assistance Equipment used: Rolling walker (2 wheels) Transfers: Sit to/from Stand Sit to Stand: Supervision           General transfer comment: for safety      Balance Overall balance assessment: Needs assistance Sitting-balance support: No upper extremity supported, Feet supported Sitting balance-Leahy Scale: Good Sitting balance - Comments: donned shoes and brace at EOB   Standing balance support: During functional activity, Bilateral upper extremity supported, Reliant on assistive device for balance Standing balance-Leahy Scale: Poor                             ADL either performed or assessed with clinical judgement   ADL Overall ADL's : Needs assistance/impaired Eating/Feeding: Independent   Grooming: Contact guard assist;Standing   Upper Body Bathing: Set up;Sitting   Lower Body Bathing: Set up;Sitting/lateral leans   Upper Body Dressing : Set up;Sitting   Lower Body Dressing: Set up;Sit to/from stand   Toilet Transfer: Contact guard assist   Toileting- Clothing Manipulation and Hygiene: Contact guard assist;Sit to/from stand   Tub/ Shower Transfer: Contact guard assist   Functional mobility during ADLs: Contact guard assist;Rolling walker (2 wheels)       Vision Baseline Vision/History: 0 No visual deficits Ability to See in Adequate Light: 0 Adequate Patient Visual Report: No change from baseline       Perception         Praxis         Pertinent Vitals/Pain Pain Assessment Pain Assessment: No/denies pain  Extremity/Trunk Assessment     Lower Extremity Assessment Lower Extremity Assessment: Defer to PT evaluation RLE Deficits / Details: hx R foot drop, pt able to don his shoes and brace   Cervical / Trunk Assessment Cervical / Trunk Assessment:  Kyphotic   Communication Communication Communication: No apparent difficulties   Cognition Arousal: Alert Behavior During Therapy: WFL for tasks assessed/performed Overall Cognitive Status: Within Functional Limits for tasks assessed                                 General Comments: highly internally distracted by situation with wife     General Comments  VSS    Exercises     Shoulder Instructions      Home Living Family/patient expects to be discharged to:: Private residence Living Arrangements: Spouse/significant other Available Help at Discharge: Family;Available PRN/intermittently Type of Home: House Home Access: Stairs to enter Entrance Stairs-Number of Steps: 1   Home Layout: Two level;Able to live on main level with bedroom/bathroom Alternate Level Stairs-Number of Steps: flight   Bathroom Shower/Tub: Chief Strategy Officer: Standard Bathroom Accessibility: Yes   Home Equipment: Tub bench;Wheelchair Financial Trader (4 wheels);Rolling Walker (2 wheels);Hand held shower head;Adaptive equipment Adaptive Equipment: Reacher Additional Comments: spouse is again currently in hospital/SNF for broken ankle      Prior Functioning/Environment Prior Level of Function : Independent/Modified Independent             Mobility Comments: Primary caretaker for wife who pt states is bedridden and disabled. He was assisting with transfers, bathing, dressing, toileting, and was doing all of the cooking/cleaning at home; Now with difficulty doing this since the CVA; also pt has been staying with daughter since last admission and plans to return there          OT Problem List: Decreased strength;Decreased activity tolerance;Impaired balance (sitting and/or standing);Pain      OT Treatment/Interventions: Self-care/ADL training;Therapeutic exercise;Energy conservation;DME and/or AE instruction;Therapeutic activities;Balance training    OT  Goals(Current goals can be found in the care plan section) Acute Rehab OT Goals Patient Stated Goal: go home OT Goal Formulation: With patient Time For Goal Achievement: 07/28/23 Potential to Achieve Goals: Good  OT Frequency: Min 1X/week    Co-evaluation              AM-PAC OT 6 Clicks Daily Activity     Outcome Measure Help from another person eating meals?: None Help from another person taking care of personal grooming?: A Little Help from another person toileting, which includes using toliet, bedpan, or urinal?: A Little Help from another person bathing (including washing, rinsing, drying)?: A Little Help from another person to put on and taking off regular upper body clothing?: A Little Help from another person to put on and taking off regular lower body clothing?: A Little 6 Click Score: 19   End of Session Equipment Utilized During Treatment: Gait belt;Rolling walker (2 wheels) Nurse Communication: Mobility status  Activity Tolerance: Patient tolerated treatment well Patient left: in bed;with call bell/phone within reach  OT Visit Diagnosis: Unsteadiness on feet (R26.81);Other abnormalities of gait and mobility (R26.89);Muscle weakness (generalized) (M62.81)                Time: 8441-8366 OT Time Calculation (min): 35 min Charges:  OT General Charges $OT Visit: 1 Visit OT Evaluation $OT Eval Low Complexity: 1 Low OT Treatments $Self Care/Home Management : 8-22 mins  Elma JONETTA Lebron FREDERICK, OTR/L [facility] Acute Rehabilitation Office: [phone]   Elma JONETTA Lebron 07/14/2023, 5:43 PM

## 2023-07-14 NOTE — Plan of Care (Signed)

## 2023-07-14 NOTE — Evaluation (Signed)
 Physical Therapy Evaluation Patient Details Name: Matthew Hall MRN: [id number] DOB: 10-29-1945 Today's Date: 07/14/2023  History of Present Illness  78 years old male admitted on 07/12/23 for Acute hypoxic respiratory failure secondary to COPD exacerbation.  Recent admission 12/8/4 for multifocal pneumonia and sepsis.  PMHx significant for but not limited to COPD, chronic bronchiectasis, type 2 diabetes, paroxysmal atrial fibrillation on Eliquis , chronic systolic heart failure, chronic leukocytosis, neuropathy, CVA, Rt drop foot  Clinical Impression  Pt admitted with above diagnosis.  Pt currently with functional limitations due to the deficits listed below (see PT Problem List). Pt will benefit from acute skilled PT to increase their independence and safety with mobility to allow discharge.  Pt ambulated in hallway with rollator and performed a couple standing exercises at foot of bed (states he has a routine of exercises at home). Pt currently staying with his daughter since admission in December but has a home in [place].   Pt also reports he is caretaker for his spouse however she has been in hospital and SNF with broken ankle and currently now in hospital for same broke it again.  Pt anticipates returning to daughter's home at d/c.  Pt's SpO2 98% on room air and 95% on room air during ambulation so left O2 [place] off end of session and RN aware.         If plan is discharge home, recommend the following: Assistance with cooking/housework;Assist for transportation   Can travel by private vehicle        Equipment Recommendations None recommended by PT  Recommendations for Other Services       Functional Status Assessment Patient has had a recent decline in their functional status and demonstrates the ability to make significant improvements in function in a reasonable and predictable amount of time.     Precautions / Restrictions Precautions Precautions: Fall Precaution Comments: Rt drop  foot Required Braces or Orthoses: Other Brace Other Brace: right ankle brace to assist with lifting shoe      Mobility  Bed Mobility Overal bed mobility: Modified Independent                  Transfers Overall transfer level: Needs assistance Equipment used: Rolling walker (2 wheels) Transfers: Sit to/from Stand Sit to Stand: Supervision           General transfer comment: appropriately uses rollator and brakes    Ambulation/Gait Ambulation/Gait assistance: Contact guard assist, Min assist Gait Distance (Feet): 400 Feet Assistive device: Rollator (4 wheels) Gait Pattern/deviations: Step-through pattern, Decreased dorsiflexion - right       General Gait Details: initially light min assist for balance however improved with distance; ambulated on room air and SPO2 95%  Stairs            Wheelchair Mobility     Tilt Bed    Modified Rankin (Stroke Patients Only)       Balance Overall balance assessment: Needs assistance Sitting-balance support: No upper extremity supported, Feet supported Sitting balance-Leahy Scale: Good Sitting balance - Comments: donned shoes and brace at EOB   Standing balance support: During functional activity, Bilateral upper extremity supported, Reliant on assistive device for balance Standing balance-Leahy Scale: Poor                               Pertinent Vitals/Pain Pain Assessment Pain Assessment: No/denies pain    Home Living Family/patient expects to be discharged  to:: Private residence Living Arrangements: Spouse/significant other Available Help at Discharge: Family;Available PRN/intermittently Type of Home: House Home Access: Stairs to enter   Entrance Stairs-Number of Steps: 1 Alternate Level Stairs-Number of Steps: flight Home Layout: Two level;Able to live on main level with bedroom/bathroom Home Equipment: Tub bench;Wheelchair Financial Trader (4 wheels);Rolling Walker (2 wheels);Hand held  shower head;Adaptive equipment Additional Comments: spouse is again currently in hospital/SNF for broken ankle    Prior Function Prior Level of Function : Independent/Modified Independent             Mobility Comments: Primary caretaker for wife who pt states is bedridden and disabled. He was assisting with transfers, bathing, dressing, toileting, and was doing all of the cooking/cleaning at home; Now with difficulty doing this since the CVA; also pt has been staying with daughter since last admission and plans to return there       Extremity/Trunk Assessment        Lower Extremity Assessment Lower Extremity Assessment: Generalized weakness;RLE deficits/detail RLE Deficits / Details: hx R foot drop, pt able to don his shoes and brace    Cervical / Trunk Assessment Cervical / Trunk Assessment: Kyphotic  Communication   Communication Communication: No apparent difficulties  Cognition Arousal: Alert Behavior During Therapy: WFL for tasks assessed/performed Overall Cognitive Status: Within Functional Limits for tasks assessed                                          General Comments      Exercises General Exercises - Lower Extremity Hip ABduction/ADduction: AROM, Both, 10 reps, Standing (standing exercises at foot board for bil UE support) Mini-Sqauts: AROM, Both, 10 reps, Standing   Assessment/Plan    PT Assessment Patient needs continued PT services  PT Problem List Decreased strength;Decreased activity tolerance;Decreased balance;Decreased mobility;Decreased knowledge of use of DME       PT Treatment Interventions DME instruction;Gait training;Stair training;Functional mobility training;Therapeutic activities;Therapeutic exercise;Balance training;Neuromuscular re-education;Patient/family education    PT Goals (Current goals can be found in the Care Plan section)  Acute Rehab PT Goals PT Goal Formulation: With patient Time For Goal Achievement:  07/28/23 Potential to Achieve Goals: Good    Frequency Min 1X/week     Co-evaluation               AM-PAC PT 6 Clicks Mobility  Outcome Measure Help needed turning from your back to your side while in a flat bed without using bedrails?: None Help needed moving from lying on your back to sitting on the side of a flat bed without using bedrails?: None Help needed moving to and from a bed to a chair (including a wheelchair)?: A Little Help needed standing up from a chair using your arms (e.g., wheelchair or bedside chair)?: A Little Help needed to walk in hospital room?: A Little Help needed climbing 3-5 steps with a railing? : A Little 6 Click Score: 20    End of Session Equipment Utilized During Treatment: Gait belt Activity Tolerance: Patient tolerated treatment well Patient left: in chair;with call bell/phone within reach;with chair alarm set;with nursing/sitter in room Nurse Communication: Mobility status PT Visit Diagnosis: Difficulty in walking, not elsewhere classified (R26.2)    Time: 8869-8841 PT Time Calculation (min) (ACUTE ONLY): 28 min   Charges:   PT Evaluation $PT Eval Low Complexity: 1 Low PT Treatments $Gait Training: 8-22 mins PT General  Charges $$ ACUTE PT VISIT: 1 Visit        Tari PT, DPT Physical Therapist Acute Rehabilitation Services Office: [phone]   Tari CROME Payson 07/14/2023, 3:12 PM

## 2023-07-14 NOTE — Progress Notes (Signed)
 PROGRESS NOTE    Matthew Hall  FMW:[id number] DOB: 1946/01/11 DOA: 07/12/2023 PCP: [name], MD    Brief Narrative:   Patient is a 78 years old male with a history of COPD, chronic bronchiectasis, type 2 diabetes, paroxysmal atrial fibrillation on Eliquis , chronic systolic heart failure, chronic leukocytosis presented to the hospital with shortness of breath and wheezing without any orthopnea or PND.  He was recently hospitalized and discharged on 06/22/2023 for acute COPD with bronchiectasis and MAC.  He had good compliance with outpatient respiratory regimen but was not on long-acting beta agonist or anticholinergic.  In the ED patient was noted to be tachypneic with hypoxia.  Labs showed mild leukocytosis.  Urinalysis was negative.  EKG showed atrial fibrillation with PVC.  Chest x-ray showed improved previously noted airspace opacities.  Patient received Solu-Medrol  Xopenex  and was added to hospital for further evaluation and treatment.   Assessment and Plan:  Acute hypoxic respiratory failure secondary to COPD exacerbation.   COVID influenza and RSV was negative.  Continue oxygen , nebulizers.  Chest x-ray with improving infiltrates compared to previous.  Continue Solu-Medrol . doxycycline .    On nasal cannula 2 L/min.  Will continue to wean as able.  Lactate was negative.  Blood cultures negative in 2 days.  Might need oxygen  on discharge.  Continue incentive spirometry, flutter valve for history of bronchiectasis.  Will assess for oxygen  need tomorrow.  Decrease Solu-Medrol  to daily.  Type 2 diabetes mellitus with hyperglycemia..  Continue sliding scale insulin  especially on steroids..  Paroxysmal atrial fibrillation.  Continue Eliquis , metoprolol .  Rate controlled at this time.  Chronic systolic heart failure.  Last known LV ejection fraction of 40 to 45%.  Continue metoprolol .  Generalized anxiety disorder.  On as needed Xanax  as outpatient.  Allergic rhinitis.  Continue  Nasacort .  Hypophosphatemia.  Replenished orally. Phosphorus of 3.1  leukocytosis.  Monitor.  WBC elevated today.  Debility, deconditioning.  Appears to be very weak.  Will get PT OT evaluation.  Patient daughter inquiring about a skilled nursing facility on discharge.     DVT prophylaxis: SCDs Start: 07/13/23 0145 apixaban  (ELIQUIS ) tablet 5 mg   Code Status:     Code Status: Full Code  Disposition: Uncertain at this time.  Get PT OT evaluation.  Might need rehabilitation.  Status is: Inpatient Remains inpatient appropriate because: pending clinical improvement, debility weakness, IV steroids,   Family Communication: Spoke with the patient's daughter on the phone and updated her about the clinical condition of the patient.  Consultants:  None  Procedures:  None  Antimicrobials:  Doxycycline   Anti-infectives (From admission, onward)    Start     Dose/Rate Route Frequency Ordered Stop   07/13/23 2200  doxycycline  (VIBRA -TABS) tablet 100 mg        100 mg Oral Every 12 hours 07/13/23 1037     07/13/23 0800  doxycycline  (VIBRAMYCIN ) 100 mg in dextrose  5 % 250 mL IVPB  Status:  Discontinued        100 mg 125 mL/hr over 120 Minutes Intravenous Every 12 hours 07/13/23 0220 07/13/23 1037       Subjective: Today, patient was seen and examined at bedside.  Complains of cough with productive sputum.  Complains of generalized weakness.  Shortness of breath better.  Has been able to sleep okay.  Did have some appetite recently.  Had bowel movement.  Daughter stated that he was confused in the morning and mixed up with her and his wife.  Objective: Vitals:   07/13/23 2217 07/14/23 0413 07/14/23 0600 07/14/23 0850  BP:  119/79    Pulse:  83    Resp: (!) 23 20 18    Temp:  97.6 F (36.4 C)    TempSrc:      SpO2:  97%  94%  Weight:  71.8 kg    Height:        Intake/Output Summary (Last 24 hours) at 07/14/2023 1027 Last data filed at 07/14/2023 0500 Gross per 24 hour  Intake  360 ml  Output 1200 ml  Net -840 ml   Filed Weights   07/13/23 0141 07/14/23 0413  Weight: 67.5 kg 71.8 kg    Physical Examination: Body mass index is 22.08 kg/m.   General:  Average built, not in obvious distress, on nasal cannula oxygen  HENT:   No scleral pallor or icterus noted. Oral mucosa is moist.  Chest:  .  Diminished breath sounds bilaterally.  Coarse breath sounds noted, CVS: S1 &S2 heard. No murmur.  Regular rate and rhythm. Abdomen: Soft, nontender, nondistended.  Bowel sounds are heard.   Extremities: No cyanosis, clubbing or edema.  Peripheral pulses are palpable. Psych: Alert, awake and oriented, normal mood Communicative, CNS:  No cranial nerve deficits.  Power equal in all extremities.   Skin: Warm and dry.  No rashes noted.  Data Reviewed:   CBC: Recent Labs  Lab 07/12/23 1524 07/13/23 0059 07/13/23 0534 07/14/23 0655  WBC 11.4*  --  9.0 27.2*  NEUTROABS 8.2*  --  7.5  --   HGB 13.3 13.9 13.0 12.1*  HCT 40.2 41.0 39.9 38.8*  MCV 92.0  --  95.2 96.8  PLT 272  --  244 288    Basic Metabolic Panel: Recent Labs  Lab 07/12/23 1524 07/13/23 0059 07/13/23 0534 07/14/23 0655  NA 134* 134* 135 137  K 3.6 4.1 4.0 4.4  CL 97*  --  101 101  CO2 30  --  26 28  GLUCOSE 117*  --  311* 143*  BUN 14  --  16 27*  CREATININE 0.71  --  0.82 0.73  CALCIUM  8.6*  --  9.1 9.0  MG  --   --  1.8  1.9 2.0  PHOS  --   --  2.2* 3.1    Liver Function Tests: Recent Labs  Lab 07/12/23 1524 07/13/23 0534  AST 16 19  ALT 13 15  ALKPHOS 77 64  BILITOT 0.6 0.4  PROT 6.8 6.6  ALBUMIN 3.1* 2.5*     Radiology Studies: DG Chest 2 View Result Date: 07/12/2023 CLINICAL DATA:  Recently treated for pneumonia. Hypotension with increased lethargy and weakness EXAM: CHEST - 2 VIEW COMPARISON:  Chest radiograph dated 06/17/2023 FINDINGS: Normal lung volumes. Markedly decreased bilateral airspace opacities with residual patchy opacity of the right lung base. No pleural  effusion or pneumothorax. Similar cardiomediastinal silhouette. No acute osseous abnormality. IMPRESSION: Markedly decreased bilateral airspace opacities with residual patchy opacity of the right lung base, likely resolving pneumonia. Electronically Signed   By: Limin  Xu M.D.   On: 07/12/2023 18:43      LOS: 1 day    Vernal Alstrom, MD [facility] Available via Epic secure chat 7am-7pm After these hours, please refer to coverage provider listed on [email or web address] 07/14/2023, 10:27 AM

## 2023-07-14 NOTE — Plan of Care (Signed)
 VSS, continues on 2L O2/Comfort., safety maintained. Bed alarm on. Call bell in reach. Will continue to monitor.   Problem: Education: Goal: Knowledge of General Education information will improve Description: Including pain rating scale, medication(s)/side effects and non-pharmacologic comfort measures Outcome: Progressing   Problem: Health Behavior/Discharge Planning: Goal: Ability to manage health-related needs will improve Outcome: Progressing   Problem: Clinical Measurements: Goal: Ability to maintain clinical measurements within normal limits will improve Outcome: Progressing Goal: Will remain free from infection Outcome: Progressing Goal: Diagnostic test results will improve Outcome: Progressing Goal: Respiratory complications will improve Outcome: Progressing Goal: Cardiovascular complication will be avoided Outcome: Progressing   Problem: Activity: Goal: Risk for activity intolerance will decrease Outcome: Progressing   Problem: Nutrition: Goal: Adequate nutrition will be maintained Outcome: Progressing   Problem: Coping: Goal: Level of anxiety will decrease Outcome: Progressing   Problem: Elimination: Goal: Will not experience complications related to bowel motility Outcome: Progressing Goal: Will not experience complications related to urinary retention Outcome: Progressing   Problem: Pain Management: Goal: General experience of comfort will improve Outcome: Progressing   Problem: Safety: Goal: Ability to remain free from injury will improve Outcome: Progressing   Problem: Skin Integrity: Goal: Risk for impaired skin integrity will decrease Outcome: Progressing   Problem: Education: Goal: Ability to describe self-care measures that may prevent or decrease complications (Diabetes Survival Skills Education) will improve Outcome: Progressing Goal: Individualized Educational Video(s) Outcome: Progressing   Problem: Coping: Goal: Ability to adjust to  condition or change in health will improve Outcome: Progressing   Problem: Fluid Volume: Goal: Ability to maintain a balanced intake and output will improve Outcome: Progressing   Problem: Health Behavior/Discharge Planning: Goal: Ability to identify and utilize available resources and services will improve Outcome: Progressing Goal: Ability to manage health-related needs will improve Outcome: Progressing   Problem: Metabolic: Goal: Ability to maintain appropriate glucose levels will improve Outcome: Progressing   Problem: Nutritional: Goal: Maintenance of adequate nutrition will improve Outcome: Progressing Goal: Progress toward achieving an optimal weight will improve Outcome: Progressing   Problem: Skin Integrity: Goal: Risk for impaired skin integrity will decrease Outcome: Progressing   Problem: Tissue Perfusion: Goal: Adequacy of tissue perfusion will improve Outcome: Progressing

## 2023-07-15 DIAGNOSIS — J441 Chronic obstructive pulmonary disease with (acute) exacerbation: Secondary | ICD-10-CM | POA: Diagnosis not present

## 2023-07-15 LAB — GLUCOSE, CAPILLARY
Glucose-Capillary: 92 mg/dL (ref 70–99)
Glucose-Capillary: 121 mg/dL — ABNORMAL HIGH (ref 70–99)

## 2023-07-15 LAB — BASIC METABOLIC PANEL
Anion gap: 6 (ref 5–15)
BUN: 29 mg/dL — ABNORMAL HIGH (ref 8–23)
CO2: 31 mmol/L (ref 22–32)
Calcium: 9.2 mg/dL (ref 8.9–10.3)
Chloride: 100 mmol/L (ref 98–111)
Creatinine, Ser: 0.68 mg/dL (ref 0.61–1.24)
GFR, Estimated: >60 mL/min (ref >60)
Glucose, Bld: 105 mg/dL — ABNORMAL HIGH (ref 70–99)
Potassium: 4.3 mmol/L (ref 3.5–5.1)
Sodium: 137 mmol/L (ref 135–145)

## 2023-07-15 LAB — CBC
HCT: 39.9 % (ref 39.0–52.0)
Hemoglobin: 12.9 g/dL — ABNORMAL LOW (ref 13.0–17.0)
MCH: 31.3 pg (ref 26.0–34.0)
MCHC: 32.3 g/dL (ref 30.0–36.0)
MCV: 96.8 fL (ref 80.0–100.0)
Platelets: 316 10*3/uL (ref 150–400)
RBC: 4.12 MIL/uL — ABNORMAL LOW (ref 4.22–5.81)
RDW: 14.1 % (ref 11.5–15.5)
WBC: 25.9 10*3/uL — ABNORMAL HIGH (ref 4.0–10.5)
nRBC: 0 % (ref 0.0–0.2)

## 2023-07-15 LAB — MAGNESIUM: Magnesium: 2 mg/dL (ref 1.7–2.4)

## 2023-07-15 MED ORDER — ADULT MULTIVITAMIN W/MINERALS CH: 1.0000 | ORAL_TABLET | Freq: Every day | ORAL | Status: DC

## 2023-07-15 MED ORDER — ENSURE ENLIVE PO LIQD: 237.0000 mL | Freq: Two times a day (BID) | ORAL | Status: AC

## 2023-07-15 MED ORDER — PREDNISONE 10 MG PO TABS: ORAL_TABLET | ORAL | 0 refills | Status: DC

## 2023-07-15 MED ORDER — FLUTICASONE PROPIONATE 50 MCG/ACT NA SUSP: 1.0000 | Freq: Every day | NASAL | 0 refills | Status: DC

## 2023-07-15 MED ORDER — DOXYCYCLINE HYCLATE 100 MG PO TABS: 100.0000 mg | ORAL_TABLET | Freq: Two times a day (BID) | ORAL | 0 refills | Status: AC

## 2023-07-15 NOTE — Discharge Summary (Signed)
 Physician Discharge Summary  Matthew Hall FMW:[id number] DOB: 05/23/1946 DOA: 07/12/2023  PCP: [name], MD  Admit date: 07/12/2023 Discharge date: 07/15/2023  Admitted From: Home  Discharge disposition: Home with home health   Recommendations for Outpatient Follow-Up:   Follow up with your primary care provider in one week.  Check CBC, BMP, magnesium  in the next visit Follow-up with pulmonary as outpatient as has been scheduled.   Discharge Diagnosis:   Principal Problem:   COPD with acute exacerbation (HCC) Active Problems:   Allergic rhinitis   Leukocytosis   Acute respiratory failure with hypoxia (HCC)   Prolonged QT interval   DM2 (diabetes mellitus, type 2) (HCC)   Paroxysmal atrial fibrillation (HCC)   GAD (generalized anxiety disorder)   Chronic systolic CHF (congestive heart failure) (HCC)   SOB (shortness of breath)   Malnutrition of moderate degree   Discharge Condition: Improved.  Diet recommendation:  Regular.  Wound care: None.  Code status: Full.   History of Present Illness:   Patient is a 78 years old male with a history of COPD, chronic bronchiectasis, type 2 diabetes, paroxysmal atrial fibrillation on Eliquis , chronic systolic heart failure, chronic leukocytosis presented to the hospital with shortness of breath and wheezing without any orthopnea or PND.  He was recently hospitalized and discharged on 06/22/2023 for acute COPD with bronchiectasis and MAC.  He had good compliance with outpatient respiratory regimen but was not on long-acting beta agonist or anticholinergic.  In the ED patient was noted to be tachypneic with hypoxia.  Labs showed mild leukocytosis.  Urinalysis was negative.  EKG showed atrial fibrillation with PVC.  Chest x-ray showed improved previously noted airspace opacities.  Patient received Solu-Medrol , Xopenex  and was added to hospital for further evaluation and treatment    Hospital Course:   Following  conditions were addressed during hospitalization as listed below,  Acute hypoxic respiratory failure secondary to COPD exacerbation.   COVID influenza and RSV was negative.  During hospitalization, patient received oxygen , nebulizers and IV Solu-Medrol  with incentive spirometry and supportive care with improvement..  Chest x-ray with improving infiltrates compared to previous.  Continue prednisone  taper, doxycycline  on discharge..  Blood cultures negative in 3 days.  Encouraged to use flutter valve, incentive spirometry at home due to history of bronchiectasis.  Patient was assessed for home oxygen  but did not qualify for it.  Type 2 diabetes mellitus with hyperglycemia. On metformin  at home.  Continue diabetic diet.   Paroxysmal atrial fibrillation.  Continue Eliquis , metoprolol .  Rate controlled at this time.   Chronic systolic heart failure.  Last known LV ejection fraction of 40 to 45%.  Continue metoprolol .  Appears to be compensated.   Generalized anxiety disorder.  On as needed Xanax  as outpatient.   Allergic rhinitis.  Continue Nasacort .   Hypophosphatemia.  Replenished orally. Phosphorus of 3.1   leukocytosis.  On steroids.   Debility, deconditioning.  Seen by physical therapy and recommend home health PT on discharge.  Disposition.  At this time, patient is stable for disposition home with home health.  Spoke with the patient's daughter on the phone.  Medical Consultants:   None.  Procedures:    None Subjective:   Today, patient was seen and examined at bedside.  Had some cough and congestion.  Denies any chest pain.  Was able to be weaned off oxygen .  Discharge Exam:   Vitals:   07/15/23 1246 07/15/23 1334  BP: (!) 147/92   Pulse: 80  Resp:    Temp: (!) 97.5 F (36.4 C)   SpO2: 96% 96%   Vitals:   07/15/23 0757 07/15/23 1200 07/15/23 1246 07/15/23 1334  BP:   (!) 147/92   Pulse:   80   Resp:      Temp:   (!) 97.5 F (36.4 C)   TempSrc:      SpO2: 94%  95% 96% 96%  Weight:      Height:       Body mass index is 22.05 kg/m.   General: Alert awake, not in obvious distress, elderly male HENT: pupils equally reacting to light,  No scleral pallor or icterus noted. Oral mucosa is moist.  Chest:    Diminished breath sounds bilaterally.  Coarse breath sounds noted. CVS: S1 &S2 heard. No murmur.  Regular rate and rhythm. Abdomen: Soft, nontender, nondistended.  Bowel sounds are heard.   Extremities: No cyanosis, clubbing or edema.  Peripheral pulses are palpable. Psych: Alert, awake and oriented, normal mood CNS:  No cranial nerve deficits.  Power equal in all extremities.   Skin: Warm and dry.  No rashes noted.  The results of significant diagnostics from this hospitalization (including imaging, microbiology, ancillary and laboratory) are listed below for reference.     Diagnostic Studies:   DG Chest 2 View Result Date: 07/12/2023 CLINICAL DATA:  Recently treated for pneumonia. Hypotension with increased lethargy and weakness EXAM: CHEST - 2 VIEW COMPARISON:  Chest radiograph dated 06/17/2023 FINDINGS: Normal lung volumes. Markedly decreased bilateral airspace opacities with residual patchy opacity of the right lung base. No pleural effusion or pneumothorax. Similar cardiomediastinal silhouette. No acute osseous abnormality. IMPRESSION: Markedly decreased bilateral airspace opacities with residual patchy opacity of the right lung base, likely resolving pneumonia. Electronically Signed   By: Limin  Xu M.D.   On: 07/12/2023 18:43     Labs:   Basic Metabolic Panel: Recent Labs  Lab 07/12/23 1524 07/13/23 0059 07/13/23 0534 07/14/23 0655 07/15/23 0740  NA 134* 134* 135 137 137  K 3.6 4.1 4.0 4.4 4.3  CL 97*  --  101 101 100  CO2 30  --  26 28 31   GLUCOSE 117*  --  311* 143* 105*  BUN 14  --  16 27* 29*  CREATININE 0.71  --  0.82 0.73 0.68  CALCIUM  8.6*  --  9.1 9.0 9.2  MG  --   --  1.8  1.9 2.0 2.0  PHOS  --   --  2.2* 3.1  --     GFR Estimated Creatinine Clearance: 75.9 mL/min (by C-G formula based on SCr of 0.68 mg/dL). Liver Function Tests: Recent Labs  Lab 07/12/23 1524 07/13/23 0534  AST 16 19  ALT 13 15  ALKPHOS 77 64  BILITOT 0.6 0.4  PROT 6.8 6.6  ALBUMIN 3.1* 2.5*   No results for input(s): LIPASE, AMYLASE in the last 168 hours. No results for input(s): AMMONIA in the last 168 hours. Coagulation profile Recent Labs  Lab 07/12/23 1524  INR 1.8*    CBC: Recent Labs  Lab 07/12/23 1524 07/13/23 0059 07/13/23 0534 07/14/23 0655 07/15/23 0740  WBC 11.4*  --  9.0 27.2* 25.9*  NEUTROABS 8.2*  --  7.5  --   --   HGB 13.3 13.9 13.0 12.1* 12.9*  HCT 40.2 41.0 39.9 38.8* 39.9  MCV 92.0  --  95.2 96.8 96.8  PLT 272  --  244 288 316   Cardiac Enzymes: No results for input(s):  CKTOTAL, CKMB, CKMBINDEX, TROPONINI in the last 168 hours. BNP: Invalid input(s): POCBNP CBG: Recent Labs  Lab 07/14/23 1315 07/14/23 1712 07/14/23 2142 07/15/23 0748 07/15/23 1148  GLUCAP 132* 183* 233* 92 121*   D-Dimer No results for input(s): DDIMER in the last 72 hours. Hgb A1c No results for input(s): HGBA1C in the last 72 hours. Lipid Profile No results for input(s): CHOL, HDL, LDLCALC, TRIG, CHOLHDL, LDLDIRECT in the last 72 hours. Thyroid  function studies No results for input(s): TSH, T4TOTAL, T3FREE, THYROIDAB in the last 72 hours.  Invalid input(s): FREET3 Anemia work up No results for input(s): VITAMINB12, FOLATE, FERRITIN, TIBC, IRON , RETICCTPCT in the last 72 hours. Microbiology Recent Results (from the past 240 hours)  Resp panel by RT-PCR (RSV, Flu A&B, Covid) Anterior Nasal Swab     Status: None   Collection Time: 07/12/23  3:43 PM   Specimen: Anterior Nasal Swab  Result Value Ref Range Status   SARS Coronavirus 2 by RT PCR NEGATIVE NEGATIVE Final    Comment: (NOTE) SARS-CoV-2 target nucleic acids are NOT DETECTED.  The  SARS-CoV-2 RNA is generally detectable in upper respiratory specimens during the acute phase of infection. The lowest concentration of SARS-CoV-2 viral copies this assay can detect is 138 copies/mL. A negative result does not preclude SARS-Cov-2 infection and should not be used as the sole basis for treatment or other patient management decisions. A negative result may occur with  improper specimen collection/handling, submission of specimen other than nasopharyngeal swab, presence of viral mutation(s) within the areas targeted by this assay, and inadequate number of viral copies(<138 copies/mL). A negative result must be combined with clinical observations, patient history, and epidemiological information. The expected result is Negative.  Fact Sheet for Patients:  bloggercourse.com  Fact Sheet for Healthcare Providers:  seriousbroker.it  This test is no t yet approved or cleared by the [place]  FDA and  has been authorized for detection and/or diagnosis of SARS-CoV-2 by FDA under an Emergency Use Authorization (EUA). This EUA will remain  in effect (meaning this test can be used) for the duration of the COVID-19 declaration under Section 564(b)(1) of the Act, 21 U.S.C.section 360bbb-3(b)(1), unless the authorization is terminated  or revoked sooner.       Influenza A by PCR NEGATIVE NEGATIVE Final   Influenza B by PCR NEGATIVE NEGATIVE Final    Comment: (NOTE) The Xpert Xpress SARS-CoV-2/FLU/RSV plus assay is intended as an aid in the diagnosis of influenza from Nasopharyngeal swab specimens and should not be used as a sole basis for treatment. Nasal washings and aspirates are unacceptable for Xpert Xpress SARS-CoV-2/FLU/RSV testing.  Fact Sheet for Patients: bloggercourse.com  Fact Sheet for Healthcare Providers: seriousbroker.it  This test is not yet approved or  cleared by the [place]  FDA and has been authorized for detection and/or diagnosis of SARS-CoV-2 by FDA under an Emergency Use Authorization (EUA). This EUA will remain in effect (meaning this test can be used) for the duration of the COVID-19 declaration under Section 564(b)(1) of the Act, 21 U.S.C. section 360bbb-3(b)(1), unless the authorization is terminated or revoked.     Resp Syncytial Virus by PCR NEGATIVE NEGATIVE Final    Comment: (NOTE) Fact Sheet for Patients: bloggercourse.com  Fact Sheet for Healthcare Providers: seriousbroker.it  This test is not yet approved or cleared by the [place]  FDA and has been authorized for detection and/or diagnosis of SARS-CoV-2 by FDA under an Emergency Use Authorization (EUA). This EUA will remain in effect (meaning  this test can be used) for the duration of the COVID-19 declaration under Section 564(b)(1) of the Act, 21 U.S.C. section 360bbb-3(b)(1), unless the authorization is terminated or revoked.  Performed at [facility], [address], [place], [place] [address]   Culture, blood (Routine x 2)     Status: None (Preliminary result)   Collection Time: 07/12/23  4:30 PM   Specimen: BLOOD  Result Value Ref Range Status   Specimen Description   Final    BLOOD LEFT ANTECUBITAL Performed at [facility], [address] [place], [place] [address]    Special Requests   Final    BOTTLES DRAWN AEROBIC AND ANAEROBIC Blood Culture adequate volume Performed at [facility], [address] [place], [place] [address]    Culture   Final    NO GROWTH 3 DAYS Performed at [facility], [address], [place], [place] [address]    Report Status PENDING  Incomplete     Discharge Instructions:   Discharge Instructions     Call MD for:  difficulty breathing, headache or visual disturbances   Complete by: As  directed    Call MD for:  temperature >100.4   Complete by: As directed    Diet general   Complete by: As directed    Discharge instructions   Complete by: As directed    Follow up with your primary care provider in one week.  Take medications as prescribed.  Do not overexert.  Follow up with your pulmonary physician as has been scheduled next week.  Seek medical attention for worsening symptoms.   Increase activity slowly   Complete by: As directed       Allergies as of 07/15/2023       Reactions   Simvastatin Rash   Cortisone Nausea And Vomiting   Albuterol  Palpitations, Other (See Comments)   Pt is okay to take xopenex , albuterol  raises his heart rate   Augmentin [amoxicillin-pot Clavulanate] Rash   Ciprofloxacin Rash   Contrast Media [iodinated Contrast Media] Rash   Flagyl  [metronidazole ] Rash   Latex Hives, Rash   Sulfa  Antibiotics Rash        Medication List     STOP taking these medications    gabapentin  100 MG capsule Commonly known as: NEURONTIN    metFORMIN  500 MG tablet Commonly known as: GLUCOPHAGE        TAKE these medications    ALPRAZolam  0.25 MG tablet Commonly known as: XANAX  Take 0.25 mg by mouth 2 (two) times daily as needed for anxiety or sleep.   apixaban  5 MG Tabs tablet Commonly known as: ELIQUIS  Take 1 tablet (5 mg total) by mouth 2 (two) times daily.   benzonatate  100 MG capsule Commonly known as: TESSALON  Take 2 capsules (200 mg total) by mouth 3 (three) times daily as needed.   budesonide  0.25 MG/2ML nebulizer solution Commonly known as: PULMICORT  Take 2 mLs (0.25 mg total) by nebulization 2 (two) times daily.   clotrimazole-betamethasone cream Commonly known as: LOTRISONE Apply 1 Application topically 2 (two) times daily.   doxycycline  100 MG tablet Commonly known as: VIBRA -TABS Take 1 tablet (100 mg total) by mouth every 12 (twelve) hours for 5 days.   ELDERBERRY PO Take 1 tablet by mouth daily.   esomeprazole  40 MG  capsule Commonly known as: NEXIUM  Take 40 mg by mouth daily at 12 noon.   feeding supplement Liqd Take 237 mLs by mouth 2 (two) times daily between meals.  fluticasone  50 MCG/ACT nasal spray Commonly known as: FLONASE  Place 1 spray into both nostrils daily. Start taking on: July 16, 2023   guaiFENesin  600 MG 12 hr tablet Commonly known as: MUCINEX  Take 1 tablet (600 mg total) by mouth 2 (two) times daily.   magnesium  oxide 400 (240 Mg) MG tablet Commonly known as: MAG-OX Take 1 tablet (400 mg total) by mouth daily.   metoprolol  succinate 25 MG 24 hr tablet Commonly known as: TOPROL -XL TAKE 1 TABLET BY MOUTH TWICE A DAY What changed:  how much to take additional instructions   multivitamin with minerals Tabs tablet Take 1 tablet by mouth daily. Start taking on: July 16, 2023   neomycin -polymyxin b-dexamethasone  3.5-10000-0.1 Oint Commonly known as: MAXITROL  Place 1 Application into both eyes at bedtime.   predniSONE  10 MG tablet Commonly known as: DELTASONE  Take 4 tablets (40 mg) daily for 2 days, then, Take 3 tablets (30 mg) daily for 2 days, then, Take 2 tablets (20 mg) daily for 2 days, then, Take 1 tablets (10 mg) daily for 1 days, then stop   sodium chloride  HYPERTONIC 3 % nebulizer solution Take 4 mLs by nebulization 2 (two) times daily.   triamcinolone  55 MCG/ACT Aero nasal inhaler Commonly known as: NASACORT  Place 1 spray into the nose daily.   Tylenol  325 MG Caps Generic drug: Acetaminophen  Take 1-2 capsules by mouth every 6 (six) hours as needed (pain).   VITAMIN B12 PO Take 1 tablet by mouth daily.   zinc  gluconate 50 MG tablet Take 50 mg by mouth 2 (two) times a week.        Follow-up Information     [name], MD Follow up in 1 week(s).   Specialty: Family Medicine Contact information: [address] [place] [address] [phone]                  Time coordinating discharge: 39 minutes  Signed:  Ramonita Koenig  [facility] 07/15/2023, 4:04 PM

## 2023-07-15 NOTE — TOC Initial Note (Addendum)
 Transition of Care [facility]) - Initial/Assessment Note    Patient Details  Name: Matthew Hall MRN: [id number] Date of Birth: 05/14/1946  [facility]) CM/SW Contact:    Sonda Manuella Quill, RN Phone Number: 07/15/2023, 2:11 PM  Clinical Narrative:                 TOC for d/c planning; spoke w/ pt in room, and dtr Rosina Shams over phone; pt says he normally lives at home w/ his wife; he will d/c to his dtr's home since his wife is in the hospital; they gave d/c address [address] 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   -1423- notified by Dr Debarah no following MD in [place] identified to follow pt; pt and dtr notified; they say he is appt w/ Dr Jude, pulmonology on Tuesday; they will follow up w/ him to obtain previously recc services; they were also notified they must contact [facility] to d/c services w/ agency if [facility] services will be provided in ; they verbalized understanding; Amy at [facility] notified; no TOC needs.        Patient Goals and CMS Choice Patient states their goals for this hospitalization and ongoing recovery are:: home   Choice offered to / list presented to : Patient      Expected Discharge Plan and Services   Discharge Planning Services: CM Consult Post Acute Care Choice: Home Health Living arrangements for the past 2 months: Single Family Home Expected Discharge Date: 07/15/23               DME Arranged: N/A DME  Agency: NA                  Prior Living Arrangements/Services Living arrangements for the past 2 months: Single Family Home Lives with:: Spouse Patient language and need for interpreter reviewed:: Yes Do you feel safe going back to the place where you live?: Yes      Need for Family Participation in Patient Care: Yes (Comment) Care giver support system in place?: Yes (comment) Current home services: DME, Home PT, Home OT ([facility]; HHPT/OT w/ [facility]) Criminal Activity/Legal Involvement Pertinent to Current Situation/Hospitalization: No - Comment as needed  Activities of Daily Living   ADL Screening (condition at time of admission) Independently performs ADLs?: Yes (appropriate for developmental age) Does the patient have a NEW difficulty with bathing/dressing/toileting/self-feeding that is expected to last >3 days?: Yes (Initiates electronic notice to provider for possible OT consult) Does the patient have a NEW difficulty with getting in/out of bed, walking, or climbing stairs that is expected to last >3 days?: No Does the patient have a NEW difficulty with communication that is expected to last >3 days?: No Is the patient deaf or have difficulty hearing?: No Does the patient have difficulty seeing, even when wearing glasses/contacts?: No Does the patient have difficulty concentrating, remembering, or making decisions?: No  Permission Sought/Granted Permission sought to share  information with : Case Manager Permission granted to share information with : Yes, Verbal Permission Granted  Share Information with NAME: Case Manager     Permission granted to share info w Relationship: Jeury Mcnab (dtr) [phone]     Emotional Assessment Appearance:: Appears stated age Attitude/Demeanor/Rapport: Gracious Affect (typically observed): Accepting Orientation: : Oriented to Self, Oriented to Place, Oriented to  Time, Oriented to Situation Alcohol  / Substance Use: Not  Applicable Psych Involvement: No (comment)  Admission diagnosis:  SOB (shortness of breath) [R06.02] Acute respiratory failure with hypoxia (HCC) [J96.01] Patient Active Problem List   Diagnosis Date Noted   Prolonged QT interval 07/13/2023   DM2 (diabetes mellitus, type 2) (HCC) 07/13/2023   Paroxysmal atrial fibrillation (HCC) 07/13/2023   GAD (generalized anxiety disorder) 07/13/2023   Chronic systolic CHF (congestive heart failure) (HCC) 07/13/2023   SOB (shortness of breath) 07/13/2023   Malnutrition of moderate degree 07/13/2023   Acute respiratory failure with hypoxia (HCC) 07/12/2023   Pain due to onychomycosis of toenails of both feet 07/02/2023   Paronychia of great toe of left foot 07/02/2023   Bronchiectasis with (acute) exacerbation (HCC) 06/20/2023   Bronchiectasis with acute exacerbation (HCC) 06/19/2023   Pneumonia due to infectious organism 06/19/2023   COPD with acute exacerbation (HCC) 06/18/2023   Multifocal pneumonia 06/17/2023   Sepsis (HCC) 06/17/2023   Physical deconditioning 06/17/2023   Weakness of right foot 05/09/2023   Cerebrovascular accident (CVA) (HCC) 04/12/2023   Anxiety 04/12/2023   Diabetic peripheral neuropathy (HCC) 12/27/2020   Paresthesia 12/01/2020   Pneumonia due to COVID-19 virus 10/29/2020   Hypokalemia 10/29/2020   Healthcare maintenance 07/25/2019   Leukocytosis 08/11/2017   Elevated sed rate 05/21/2017   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) 03/01/2017   Vasculitis (HCC) 02/20/2017   Atrial fibrillation, chronic (HCC) 07/09/2016   BRONCHIECTASIS W/O ACUTE EXACERBATION 03/31/2008   Essential hypertension 09/13/2007   Allergic rhinitis 09/13/2007   EMPHYSEMA 09/13/2007   COPD exacerbation (HCC) 09/13/2007   PCP:  [name], MD Pharmacy:   [facility] [phone] [place] [place] - [address] [place] [address] Phone: [phone] Fax: [phone]  [facility] [address] [place] [address] Phone: [phone] Fax: [phone]  [id number] [place], [place] - [address] [place] [address] Phone: [phone] Fax: [phone]     Social Drivers of Health (SDOH) Social History: SDOH Screenings   Food Insecurity: No Food Insecurity (07/15/2023)  Recent Concern: Food Insecurity - Food Insecurity Present (06/18/2023)  Housing: Low Risk  (07/15/2023)  Recent Concern: Housing - High Risk (04/16/2023)  Transportation Needs: No Transportation Needs (07/15/2023)  Recent Concern: Transportation Needs - Unmet Transportation Needs (07/13/2023)  Utilities: Not At Risk (07/15/2023)  Depression (PHQ2-9): Low Risk  (05/04/2019)  Social Connections: Moderately Isolated (07/13/2023)  Tobacco Use: Medium Risk (07/13/2023)   SDOH Interventions: Food Insecurity Interventions: Intervention Not Indicated, Inpatient TOC Housing Interventions: Intervention Not Indicated, Inpatient TOC Transportation Interventions: Intervention Not Indicated, Inpatient TOC Utilities Interventions: Intervention Not Indicated, Inpatient TOC   Readmission Risk Interventions    07/15/2023    2:01 PM 06/22/2023   11:49 AM  Readmission Risk Prevention Plan  Transportation Screening Complete Complete  Home Care Screening  Complete  Medication Review (RN CM)  Complete  Medication Review (RN Care Manager) Complete   PCP or Specialist appointment within 3-5 days of discharge Complete   HRI or Home  Care Consult Complete   SW Recovery Care/Counseling Consult Complete   Palliative Care Screening Not Applicable   Skilled Nursing Facility Not Applicable

## 2023-07-15 NOTE — Progress Notes (Signed)
 Mobility Specialist - Progress Note  (RA) Pre-mobility: 90 bpm HR, 94% SpO2 During mobility: 104 bpm HR, 92% SpO2 Post-mobility: 94 bpm HR, 97% SPO2   07/15/23 1334  Mobility  Activity Ambulated with assistance in hallway  Level of Assistance Standby assist, set-up cues, supervision of patient - no hands on  Assistive Device Four wheel walker  Distance Ambulated (ft) 500 ft  Range of Motion/Exercises Active  Activity Response Tolerated well  Mobility Referral Yes  Mobility visit 1 Mobility  Mobility Specialist Start Time (ACUTE ONLY) 1320  Mobility Specialist Stop Time (ACUTE ONLY) 1334  Mobility Specialist Time Calculation (min) (ACUTE ONLY) 14 min   Pt was found in bed and agreeable to ambulate. No complaints with session. At EOS returned to bed with all needs met. Call bell in reach and RN notified.  Matthew Hall Mobility Specialist

## 2023-07-17 ENCOUNTER — Telehealth: Payer: Self-pay | Admitting: *Deleted

## 2023-07-17 ENCOUNTER — Other Ambulatory Visit: Payer: Self-pay | Admitting: Adult Health

## 2023-07-17 ENCOUNTER — Other Ambulatory Visit (HOSPITAL_COMMUNITY): Payer: Self-pay | Admitting: Adult Health

## 2023-07-17 ENCOUNTER — Telehealth: Payer: Self-pay | Admitting: Adult Health

## 2023-07-17 ENCOUNTER — Encounter: Payer: Self-pay | Admitting: *Deleted

## 2023-07-17 ENCOUNTER — Ambulatory Visit: Payer: Medicare Other | Admitting: Adult Health

## 2023-07-17 ENCOUNTER — Other Ambulatory Visit (HOSPITAL_COMMUNITY): Payer: Self-pay

## 2023-07-17 ENCOUNTER — Encounter: Payer: Self-pay | Admitting: Adult Health

## 2023-07-17 VITALS — BP 130/72 | HR 83 | Temp 97.6°F | Ht 71.0 in | Wt 153.0 lb

## 2023-07-17 DIAGNOSIS — J471 Bronchiectasis with (acute) exacerbation: Secondary | ICD-10-CM

## 2023-07-17 DIAGNOSIS — R0683 Snoring: Secondary | ICD-10-CM

## 2023-07-17 DIAGNOSIS — I482 Chronic atrial fibrillation, unspecified: Secondary | ICD-10-CM | POA: Diagnosis not present

## 2023-07-17 DIAGNOSIS — I5022 Chronic systolic (congestive) heart failure: Secondary | ICD-10-CM

## 2023-07-17 DIAGNOSIS — J449 Chronic obstructive pulmonary disease, unspecified: Secondary | ICD-10-CM

## 2023-07-17 DIAGNOSIS — R1310 Dysphagia, unspecified: Secondary | ICD-10-CM

## 2023-07-17 DIAGNOSIS — R059 Cough, unspecified: Secondary | ICD-10-CM

## 2023-07-17 DIAGNOSIS — I639 Cerebral infarction, unspecified: Secondary | ICD-10-CM

## 2023-07-17 DIAGNOSIS — J189 Pneumonia, unspecified organism: Secondary | ICD-10-CM

## 2023-07-17 LAB — CULTURE, BLOOD (ROUTINE X 2)
Culture: NO GROWTH
Special Requests: ADEQUATE

## 2023-07-17 MED ORDER — LEVALBUTEROL TARTRATE 45 MCG/ACT IN AERO: 2.0000 | INHALATION_SPRAY | RESPIRATORY_TRACT | 6 refills | Status: DC | PRN

## 2023-07-17 MED ORDER — ARFORMOTEROL TARTRATE 15 MCG/2ML IN NEBU: 15.0000 ug | INHALATION_SOLUTION | Freq: Two times a day (BID) | RESPIRATORY_TRACT | 6 refills | Status: DC

## 2023-07-17 MED ORDER — LEVALBUTEROL HCL 0.63 MG/3ML IN NEBU
0.6300 mg | INHALATION_SOLUTION | Freq: Four times a day (QID) | RESPIRATORY_TRACT | 5 refills | Status: DC | PRN
  Filled 2023-12-14: qty 270, 23d supply, fill #0

## 2023-07-17 NOTE — Patient Instructions (Addendum)
 Finish Doxycycline  and Prednisone  as directed.  Continue on Budesonide  Neb Twice daily   Continue on Saline neb Twice daily   Add Brovana  Neb Twice daily   Xopenex  neb As needed   Aspiration precautions as discussed.  Restart Flutter valve Three times a day   Set Modified Barium Swallow.  Set up for split night sleep study. Home health evaluation for nursing/PT/OT.  Follow up with Dr. Jude  in 3 weeks and As needed   Please contact office for sooner follow up if symptoms do not improve or worsen or seek emergency care

## 2023-07-17 NOTE — Progress Notes (Signed)
 @Patient  ID: Matthew Hall, male    DOB: October 11, 1945, 78 y.o.   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  CT abdomen from 2019 shows right lower lobe bronchiectasis   CT chest 2009 showed >> RLL & lingular bronchectasis & areas of pleuro-parenchymal scarring. 8 mm low density lesion in liver >> stable on rpt CT 01/2008. Incidentally, not seen on CT in 1995.   FEV1 trend '95 59% >> 64% in 09/2007 >> 59% in 03/2008     09/2014  FEv1 40%, ratio 62 07/2015 FEV1 40%   PFTs 10/2022 severe airway obstruction, ratio 47, FEV1 33%, TLC normal, DLCO 50%   Anti-trypsin nml   07/17/2023 Follow up : COPD, Bronchiectasis , Post hospital follow up  Patient is accompanied by his daughter who he is currently staying with since he got out of the hospital Patient returns for a posthospital follow-up.  Patient has had recurrent hospitalizations over the last few months. .  Most recently admitted earlier this week for COPD exacerbation with acute respiratory failure.  Viral panel was negative.  Chest x-ray showed decreased bilateral infiltrates.  Blood cultures were negative.  Treated with antibiotics and steroids.  Discharged on prednisone  taper and doxycycline .  Has few days left of his antibiotics. Previously admitted early December with multifocal pneumonia, COPD and bronchiectatic exacerbation.  Chest x-ray showed multifocal pneumonia with multifocal patchy opacities. CT chest June 17, 2023 showed  bronchiectasis with mucoid impaction and extensive peribronchovascular nodularity.  Underwent bronchoscopy with BAL June 19, 2023.  Culture showed Stenotrophomonas maltophilia .  He was treated with aggressive antibiotic therapy  with a 2-week course of minocycline .  AFB cultures were negative.  Fungal cultures were negative.  He currently remains on budesonide  nebulizer twice daily.  He is using saline nebs once or twice a daily.  Has Xopenex  nebs that he uses as needed.  Since discharge is feeling some better.  Still has a ongoing intermittent cough.  Patient was admitted in October 2024 with an acute stroke.  He was seen by neurology.  Changed from Coumadin  to Eliquis .  MRI brain showed punctate acute infarcts in the right temporal lobe, right basal ganglia and left frontal white matter.Workup -Carotid US  with 1-39% stenosis bilaterally, antegrade flow in bilateral vertebral arteries Echo with EF 40-45%.  Continues to have some generalized weakness.  Currently using a rolling walker.  Has right sided foot drop.  Also has noticed some swallow issues.  Patient also has snoring, daytime sleepiness.  Has a history as above of A-fib and stroke.  We discussed evaluating for possible sleep apnea.  Patient has been following with rheumatology.  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   Has been under a lot of stress with his recurrent illnesses.  His wife is also very sick.  He is currently in the ICU with sepsis. Daughter is trying to take care of both him and his wife.  Allergies  Allergen Reactions   Simvastatin Rash   Cortisone Nausea And Vomiting   Albuterol  Palpitations and Other (See Comments)  Pt is okay to take xopenex , albuterol  raises his heart rate   Augmentin [Amoxicillin-Pot Clavulanate] Rash   Ciprofloxacin Rash   Contrast Media [Iodinated Contrast Media] Rash   Flagyl  [Metronidazole ] Rash   Latex Hives and Rash   Sulfa  Antibiotics Rash     Immunization History  Administered Date(s) Administered   Influenza Split 05/08/2017   Influenza Whole 06/05/1995, 03/31/2008   Influenza,inj,Quad PF,6+ Mos 04/20/2016   Influenza-Unspecified 04/09/2014, 04/19/2015, 04/09/2018, 04/10/2023   Moderna SARS-COV2 Booster Vaccination 05/08/2020   Pneumococcal Conjugate-13 04/09/2014, 03/01/2016, 04/09/2017   Pneumococcal Polysaccharide-23 05/02/1994, 08/07/2000   Rabies, IM 12/07/2014, 12/10/2014, 12/14/2014, 12/21/2014   Tdap 02/04/2017   Zoster Recombinant(Shingrix) 04/10/2015, 04/09/2017    Past Medical History:  Diagnosis Date   Anxiety    Aortic atherosclerosis (HCC)    Atrial fibrillation (HCC)    Basal cell carcinoma    Bladder cancer (HCC)    Bladder tumor    Chronic systolic heart failure (HCC)    Colon polyps    COPD (chronic obstructive pulmonary disease) (HCC)    Cutaneous vasculitis    Diabetic peripheral neuropathy (HCC) 12/27/2020   Diverticulosis    Essential hypertension    GERD (gastroesophageal reflux disease)    History of pneumonia    Nephrolithiasis    Osteoarthritis    Right foot drop    Stroke (HCC)     Tobacco History: Social History   Tobacco Use  Smoking Status Former   Current packs/day: 0.00   Average packs/day: 1 pack/day for 30.0 years (30.0 ttl pk-yrs)   Types: Cigarettes   Start date: 07/06/1981   Quit date: 07/07/2011   Years since quitting: 12.0  Smokeless Tobacco Never   Counseling given: Not Answered   Outpatient Medications Prior to Visit  Medication Sig Dispense Refill   Acetaminophen  (TYLENOL ) 325 MG CAPS Take 1-2 capsules by mouth every 6 (six) hours as needed (pain).     ALPRAZolam  (XANAX ) 0.25 MG tablet Take 0.25 mg by mouth 2 (two) times daily as needed for anxiety or sleep.   3   apixaban  (ELIQUIS ) 5 MG TABS tablet Take 1 tablet (5 mg total) by mouth 2 (two) times daily.     benzonatate  (TESSALON ) 100 MG capsule Take 2 capsules (200 mg total) by mouth 3 (three)  times daily as needed. 30 capsule 0   budesonide  (PULMICORT ) 0.25 MG/2ML nebulizer solution Take 2 mLs (0.25 mg total) by nebulization 2 (two) times daily. 60 mL 12   clotrimazole-betamethasone (LOTRISONE) cream Apply 1 Application topically 2 (two) times daily.     Cyanocobalamin  (VITAMIN B12 PO) Take 1 tablet by mouth daily.     doxycycline  (VIBRA -TABS) 100 MG tablet Take 1 tablet (100 mg total) by mouth every 12 (twelve) hours for 5 days. 10 tablet 0   ELDERBERRY PO Take 1 tablet by mouth daily.     esomeprazole  (NEXIUM ) 40 MG capsule Take 40 mg by mouth daily at 12 noon.     feeding supplement (ENSURE ENLIVE / ENSURE PLUS) LIQD Take 237 mLs by mouth 2 (two) times daily between meals.     guaiFENesin  (MUCINEX ) 600 MG 12 hr tablet Take 1 tablet (600 mg total) by mouth 2 (two) times daily. 60 tablet 0   magnesium  oxide (MAG-OX) 400 (240 Mg) MG tablet Take 1 tablet (400 mg total) by mouth daily. 30 tablet 0   metoprolol  succinate (TOPROL -XL) 25 MG 24 hr tablet TAKE 1 TABLET BY MOUTH TWICE A DAY (Patient taking differently: Take 25-50  mg by mouth 2 (two) times daily. Alternate taking 25mg  by mouth twice daily and 50mg  by mouth twice daily.) 180 tablet 0   Multiple Vitamin (MULTIVITAMIN WITH MINERALS) TABS tablet Take 1 tablet by mouth daily.     neomycin -polymyxin b-dexamethasone  (MAXITROL ) 3.5-10000-0.1 OINT Place 1 Application into both eyes at bedtime. 3.5 g 0   predniSONE  (DELTASONE ) 10 MG tablet Take 4 tablets (40 mg) daily for 2 days, then, Take 3 tablets (30 mg) daily for 2 days, then, Take 2 tablets (20 mg) daily for 2 days, then, Take 1 tablets (10 mg) daily for 1 days, then stop 19 tablet 0   sodium chloride  HYPERTONIC 3 % nebulizer solution Take 4 mLs by nebulization 2 (two) times daily. 750 mL 12   triamcinolone  (NASACORT ) 55 MCG/ACT AERO nasal inhaler Place 1 spray into the nose daily.     zinc  gluconate 50 MG tablet Take 50 mg by mouth 2 (two) times a week.     fluticasone  (FLONASE ) 50  MCG/ACT nasal spray Place 1 spray into both nostrils daily. (Patient not taking: Reported on 07/17/2023) 1 g 0   No facility-administered medications prior to visit.     Review of Systems:   Constitutional:   No  weight loss, night sweats,  Fevers, chills, +fatigue, or  lassitude.  HEENT:   No headaches,  Difficulty swallowing,  Tooth/dental problems, or  Sore throat,                No sneezing, itching, ear ache, nasal congestion, post nasal drip,   CV:  No chest pain,  Orthopnea, PND, swelling in lower extremities, anasarca, dizziness, palpitations, syncope.   GI  No heartburn, indigestion, abdominal pain, nausea, vomiting, diarrhea, change in bowel habits, loss of appetite, bloody stools.   Resp:   No chest wall deformity  Skin: no rash or lesions.  GU: no dysuria, change in color of urine, no urgency or frequency.  No flank pain, no hematuria   MS:  No joint pain or swelling.  No decreased range of motion.  No back pain.    Physical Exam  BP 130/72 (BP Location: Left Arm, Patient Position: Sitting, Cuff Size: Normal)   Pulse 83   Temp 97.6 F (36.4 C) (Oral)   Ht 5' 11 (1.803 m)   Wt 153 lb (69.4 kg)   SpO2 97%   BMI 21.34 kg/m   GEN: A/Ox3; pleasant , NAD, chronically ill-appearing, rolling walker   HEENT:  [place]/AT,  EACs-clear, TMs-wnl, NOSE-clear, THROAT-clear, no lesions, no postnasal drip or exudate noted.   NECK:  Supple w/ fair ROM; no JVD; normal carotid impulses w/o bruits; no thyromegaly or nodules palpated; no lymphadenopathy.    RESP coarse rhonchi bilaterally  no accessory muscle use, no dullness to percussion  CARD:  RRR, no m/r/g, no peripheral edema, pulses intact, no cyanosis or clubbing.  GI:   Soft & nt; nml bowel sounds; no organomegaly or masses detected.   Musco: Warm bil, no deformities or joint swelling noted.   Neuro: alert, no focal deficits noted.    Skin: Warm, no lesions or rashes    Lab Results:  CBC    Component Value  Date/Time   WBC 25.9 (H) 07/15/2023 0740   RBC 4.12 (L) 07/15/2023 0740   HGB 12.9 (L) 07/15/2023 0740   HCT 39.9 07/15/2023 0740   PLT 316 07/15/2023 0740   MCV 96.8 07/15/2023 0740   MCH 31.3 07/15/2023 0740   MCHC 32.3 07/15/2023  0740   RDW 14.1 07/15/2023 0740   LYMPHSABS 1.0 07/13/2023 0534   MONOABS 0.3 07/13/2023 0534   EOSABS 0.0 07/13/2023 0534   BASOSABS 0.0 07/13/2023 0534    BMET    Component Value Date/Time   NA 137 07/15/2023 0740   NA 139 05/09/2023 1158   K 4.3 07/15/2023 0740   CL 100 07/15/2023 0740   CO2 31 07/15/2023 0740   GLUCOSE 105 (H) 07/15/2023 0740   BUN 29 (H) 07/15/2023 0740   BUN 19 05/09/2023 1158   CREATININE 0.68 07/15/2023 0740   CREATININE 1.03 02/23/2017 0902   CALCIUM  9.2 07/15/2023 0740   GFRNONAA >60 07/15/2023 0740   GFRAA >60 08/09/2017 0322    BNP    Component Value Date/Time   BNP 117.8 (H) 07/13/2023 0533    ProBNP No results found for: PROBNP  Imaging: DG Chest 2 View Result Date: 07/12/2023 CLINICAL DATA:  Recently treated for pneumonia. Hypotension with increased lethargy and weakness EXAM: CHEST - 2 VIEW COMPARISON:  Chest radiograph dated 06/17/2023 FINDINGS: Normal lung volumes. Markedly decreased bilateral airspace opacities with residual patchy opacity of the right lung base. No pleural effusion or pneumothorax. Similar cardiomediastinal silhouette. No acute osseous abnormality. IMPRESSION: Markedly decreased bilateral airspace opacities with residual patchy opacity of the right lung base, likely resolving pneumonia. Electronically Signed   By: Limin  Xu M.D.   On: 07/12/2023 18:43   CT CHEST WO CONTRAST Result Date: 06/17/2023 CLINICAL DATA:  Cough. Failed inferior treatment for multifocal pneumonia. 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 EXAM: CT CHEST WITHOUT CONTRAST TECHNIQUE: Multidetector CT imaging of the chest was performed following the standard protocol without IV contrast. RADIATION DOSE REDUCTION: This  exam was performed according to the departmental dose-optimization program which includes automated exposure control, adjustment of the mA and/or kV according to patient size and/or use of iterative reconstruction technique. COMPARISON:  09/18/2022 FINDINGS: Cardiovascular: Heart is mildly enlarged. Scattered coronary artery and aortic atherosclerosis. Mediastinum/Nodes: No mediastinal, hilar, or axillary adenopathy. Trachea and esophagus are unremarkable. Thyroid  unremarkable. Lungs/Pleura: Biapical scarring. Peribronchovascular nodularity again noted throughout the lungs, most pronounced in the lower lobes where there is also bronchiectasis and mucous plugging, right greater than left. Findings are stable since prior study. No acute areas of consolidation. Trace right pleural effusion, new since prior study. Upper Abdomen: No acute findings Musculoskeletal: Chest wall soft tissues are unremarkable. No acute bony abnormality. IMPRESSION: Bronchiectasis with mucoid impaction and extensive peribronchovascular nodularity again noted in similar to prior study. Compatible with chronic MAC. Trace right pleural effusion. Coronary artery disease. Aortic Atherosclerosis (ICD10-I70.0). Electronically Signed   By: Franky Crease M.D.   On: 06/17/2023 21:32   DG Chest 2 View Result Date: 06/17/2023 CLINICAL DATA:  Confusion, weakness, recent pneumonia EXAM: CHEST - 2 VIEW COMPARISON:  04/26/2023 FINDINGS: Multifocal patchy opacities, right lower lobe predominant, suggesting multifocal pneumonia. Small right pleural effusion. No pneumothorax. The heart is normal in size. Visualized osseous structures are within normal limits. IMPRESSION: Multifocal pneumonia, right lower lobe predominant. Small right pleural effusion. Electronically Signed   By: Pinkie Pebbles M.D.   On: 06/17/2023 17:13    Administration History     None          Latest Ref Rng & Units 10/10/2022    2:57 PM 11/30/2017    1:26 PM  PFT Results   FVC-Pre L 2.20  1.78  P  FVC-Predicted Pre % 51  39  P  FVC-Post L 2.23  1.83  P  FVC-Predicted Post % 52  40  P  Pre FEV1/FVC % % 47  53  P  Post FEV1/FCV % % 49  55  P  FEV1-Pre L 1.05  0.94  P  FEV1-Predicted Pre % 33  28  P  FEV1-Post L 1.09  1.01  P  DLCO uncorrected ml/min/mmHg 12.64    DLCO UNC% % 50    DLVA Predicted % 98    TLC L 6.93    TLC % Predicted % 95    RV % Predicted % 178      P Preliminary result    No results found for: NITRICOXIDE      Assessment & Plan:   Assessment and Plan    Multifocal Pneumonia -at risk for aspiration with recent stroke.  Will check modified barium swallow to rule out dysphagia. Aspiration precautions discussed.  Clinically is improving after antibiotics.  He is advised to stay upright for at least an hour after eating, avoid late-night eating, and avoid talking while eating.  Finish up antibiotics as recommended.  Will repeat chest x-ray at follow-up visit in 3 weeks   Chronic Obstructive Pulmonary Disease (COPD) He has severe COPD . We will continue Pulmicort  (budesonide ) and saline nebulizer twice daily, add Brovana  (arformoterol ) nebulizer twice daily, and continue Xopenex  (levalbuterol ) as needed. He is advised to use the flutter valve before and after nebulizer treatments and consider vest therapy if recurrent issues persist.  Snoring, daytime sleepiness, history of A-fib, congestive heart failure and stroke at risk for sleep apnea Will need an in lab split-night sleep study .    Hx of Stroke- He experienced a stroke approximately 4-5 months ago, resulting in residual right foot drop, balance issues. Possible dysphagia check modified barium swallow.  Continue follow-up with neurology.  Physical deconditioning.  Recommend home health evaluation for outpatient nursing, physical therapy and Occupational Therapy.  Leukocytosis.  Follow-up labs once patient is off steroids.  Keep follow-up with primary care  General  Health Maintenance Continue follow-up primary care   follow-up He will follow up with Dr Jude in 3 weeks and As needed      Madelin Stank, NP 07/17/2023

## 2023-07-17 NOTE — Addendum Note (Signed)
 Addended by: Delrae Rend on: 07/17/2023 02:38 PM   Modules accepted: Orders

## 2023-07-17 NOTE — Telephone Encounter (Signed)
 Called and spoke with patient's daughter, Rosina and advised her about the Xopenex  inhaler (not covered), can get it cheaper at a [facility] using a coupon.  She would like the script sent to the [facility] on [address]  I advised I would send the Brovona to a DME company as it is $1000 at [facility].  She verbalized understanding.  Nothing further needed.

## 2023-07-17 NOTE — Telephone Encounter (Signed)
 [facility] . Rx sent .

## 2023-07-18 ENCOUNTER — Other Ambulatory Visit (HOSPITAL_COMMUNITY): Payer: Self-pay

## 2023-07-18 LAB — FUNGUS CULTURE RESULT

## 2023-07-18 LAB — FUNGUS CULTURE WITH STAIN

## 2023-07-18 LAB — FUNGAL ORGANISM REFLEX

## 2023-07-19 LAB — FUNGUS CULTURE WITH STAIN

## 2023-07-19 LAB — FUNGAL ORGANISM REFLEX

## 2023-07-19 LAB — FUNGUS CULTURE RESULT

## 2023-07-20 NOTE — Telephone Encounter (Signed)
 PA team, please advise. Thanks ?

## 2023-07-25 ENCOUNTER — Other Ambulatory Visit (HOSPITAL_COMMUNITY): Payer: Self-pay

## 2023-07-25 NOTE — Telephone Encounter (Signed)
 Per test claims medication must be processed through patients Medicare Part B, if this still needs a PA the office will have to complete a CMN form through Medicare.

## 2023-07-27 NOTE — Progress Notes (Signed)
Late note from Pulmonary clinic on 07/12/23.   Patient triaged in clinic. Found hypotensive, tachycardic and borderline hypoxemic.  Blood pressure (!) 84/58, pulse (!) 105, resp. rate 20, SpO2 98%. Advised to go to the ED. On chart review he was admitted from 1/2-1/5

## 2023-08-02 LAB — ACID FAST CULTURE WITH REFLEXED SENSITIVITIES (MYCOBACTERIA): Acid Fast Culture: NEGATIVE

## 2023-08-04 LAB — ACID FAST CULTURE WITH REFLEXED SENSITIVITIES (MYCOBACTERIA): Acid Fast Culture: NEGATIVE

## 2023-08-08 ENCOUNTER — Telehealth (HOSPITAL_BASED_OUTPATIENT_CLINIC_OR_DEPARTMENT_OTHER): Payer: Self-pay | Admitting: Pulmonary Disease

## 2023-08-08 ENCOUNTER — Ambulatory Visit (HOSPITAL_BASED_OUTPATIENT_CLINIC_OR_DEPARTMENT_OTHER): Payer: Medicare Other | Admitting: Pulmonary Disease

## 2023-08-08 ENCOUNTER — Encounter (HOSPITAL_BASED_OUTPATIENT_CLINIC_OR_DEPARTMENT_OTHER): Payer: Self-pay | Admitting: Pulmonary Disease

## 2023-08-08 VITALS — BP 136/76 | HR 94 | Ht 71.0 in | Wt 149.0 lb

## 2023-08-08 DIAGNOSIS — I639 Cerebral infarction, unspecified: Secondary | ICD-10-CM

## 2023-08-08 DIAGNOSIS — J449 Chronic obstructive pulmonary disease, unspecified: Secondary | ICD-10-CM | POA: Diagnosis not present

## 2023-08-08 DIAGNOSIS — I5020 Unspecified systolic (congestive) heart failure: Secondary | ICD-10-CM | POA: Diagnosis not present

## 2023-08-08 DIAGNOSIS — J471 Bronchiectasis with (acute) exacerbation: Secondary | ICD-10-CM | POA: Diagnosis not present

## 2023-08-08 MED ORDER — GUAIFENESIN ER 600 MG PO TB12: 600.0000 mg | ORAL_TABLET | Freq: Two times a day (BID) | ORAL | 5 refills | Status: DC

## 2023-08-08 NOTE — Progress Notes (Signed)
Subjective:    Patient ID: Matthew Hall, male    DOB: Jun 18, 1946, 78 y.o.   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   PMH - diabetes-2,  congestive heart failure, EF 40-45%  CVA 04/2023 -MRI >> punctate acute infarcts in the right temporal lobe, right basal ganglia and left frontal white matter.  bladder cancer  Lymphocytic " Vasculitis" - follows with rheum > rash, chronic steroids Albuterol caused increased heart rate, Xopenex nebs works better   admitted early December with multifocal pneumonia, COPD and bronchiectatic exacerbation.  Chest x-ray showed multifocal pneumonia with multifocal patchy opacities. CT chest June 17, 2023 showed bronchiectasis with mucoid impaction and extensive peribronchovascular nodularity.  Underwent bronchoscopy with BAL June 19, 2023.  Culture showed Stenotrophomonas maltophilia .  He was treated with aggressive antibiotic therapy  with a 2-week course of minocycline.  AFB cultures were negative.  Fungal cultures were negative.   admitted 07/2023  for COPD exacerbation with acute respiratory failure. Viral panel was negative. Chest x-ray showed decreased bilateral infiltrates. Blood cultures were negative. Treated with antibiotics and steroids. Discharged on prednisone taper and doxycycline   Discussed the use of AI scribe software for clinical note transcription with the patient, who gave verbal consent to proceed.  History of Present Illness   The patient, with a history of bronchiectasis and stroke, presents for follow-up. The patient's bronchiectasis has been causing recurrent pneumonia, which has been treated with multiple antibiotics. The patient reports using a flutter valve and nebulizer treatments  to manage the bronchiectasis. The patient also reports taking Mucinex and saline nebulizer treatments to thin the mucus. The patient's stroke has resulted in foot drop, which has affected the patient's ability to drive. The patient has recently moved to an independent living facility and is adjusting to the new environment. The patient also has a new diagnosis of congestive heart failure, which was discovered during the stroke workup. The patient's heart pump was found to be mildly decreased to 45%. The patient's family member is concerned about the patient's overall health and the coordination of care.       Significant tests/ events reviewed    CT chest 06/2023  Bronchiectasis with mucoid impaction and extensive peribronchovascular nodularity - stable HRCT chest 09/2022 >> Extensive peribronchovascular nodularity and nodular consolidation, bronchiectasis and mucoid impaction bilaterally.  CT abdomen from 2019 shows right lower lobe bronchiectasis   CT chest 2009 showed >> RLL & lingular bronchectasis & areas of pleuro-parenchymal scarring. 8 mm low density lesion in liver >> stable on rpt CT 01/2008. Incidentally, not seen on CT in 1995.   FEV1 trend '95 59% >> 64% in 09/2007 >> 59% in 03/2008     09/2014  FEv1 40%, ratio 62 07/2015 FEV1 40%   PFTs 10/2022 severe airway obstruction, ratio 47, FEV1 33%, TLC normal, DLCO 50%   Anti-trypsin nml   Review of Systems neg for any significant sore throat, dysphagia, itching, sneezing, nasal congestion or excess/ purulent secretions, fever, chills, sweats, unintended wt loss, pleuritic or exertional cp, hempoptysis, orthopnea pnd or change in chronic leg swelling. Also denies presyncope, palpitations, heartburn, abdominal pain, nausea, vomiting, diarrhea or change in bowel or urinary habits, dysuria,hematuria, rash, arthralgias, visual complaints,  headache, numbness weakness or ataxia.     Objective:   Physical Exam  Gen. Pleasant, well-nourished, in  no distress ENT - no thrush, no pallor/icterus,no post nasal drip Neck: No JVD, no thyromegaly, no carotid bruits Lungs: no use of accessory muscles, no dullness to percussion, clear without rales or rhonchi  Cardiovascular: Rhythm regular, heart sounds  normal, no murmurs or gallops, no peripheral edema Musculoskeletal: no cyanosis or clubbing , RT foot drop       Assessment & Plan:    Assessment and Plan    Bronchiectasis with exacerbation Chronic condition with airway damage, mucus accumulation, and recurrent infections. Recent episode treated with minocycline and doxycycline. Uses flutter valve and nebulizer treatments. Emphasized airway clearance to prevent complications. Discussed Mucinex, saline nebulizers, and potential vest therapy. Stressed hydration. - Refill Mucinex prescription - Follow up on prior authorization for brovana - Continue budesonide and Xopenex as needed - Encourage flutter valve use 3-4 times daily - Ensure adequate hydration, saline nebs - Consider vest therapy in future visits  Chronic Obstructive Pulmonary Disease (COPD) COPD contributing to respiratory difficulties. Uses multiple inhalers and nebulizer treatments. Reports exertional dyspnea but manages well with current medications. Discussed Xopenex as rescue inhaler and importance of hydration. - Continue current nebulizer medications (budesonide, Xopenex, hypotonic saline) - Monitor respiratory status and adjust treatment as needed  Congestive Heart Failure (CHF) Mildly decreased ejection fraction (45%) and atrial fibrillation. Needs cardiology follow-up post-stroke for ongoing management. Discussed importance of cardiology follow-up. - Refer to cardiology for follow-up - Monitor for heart failure symptoms and adjust treatment as needed  Stroke with Residual Foot Drop Stroke resulted in right foot drop, impacting mobility and driving. Recently moved to independent living facility. Discussed need  for physical, occupational, and speech therapy evaluations and treatments. - Order physical, occupational, and speech therapy evaluations and treatments - Coordinate with independent living facility for support services  General Health Maintenance Adjusting to new living situation, requiring coordination of care among specialists. Advised to stay hydrated and use respiratory devices regularly. - Encourage regular use of respiratory devices (flutter valve, nebulizer) - Ensure adequate hydration - Coordinate care with primary care provider and specialists  Follow-up - Follow up every three months for the first year - Monitor response to current treatments and adjust as necessary - Consider vest therapy in future visits if needed.

## 2023-08-08 NOTE — Patient Instructions (Signed)
X refill on Mucinex  X Rx for brovana to DME  -May need prior authorization  X Order for OT/PT/speech therapy to [facility] -for CVA  x referral to cardiology for follow-up for atrial fibrillation and heart failure   Airway clearance is key -use Mucinex, saline nebs twice daily, flutter valve We can consider prescription for vest in the future.

## 2023-08-08 NOTE — Telephone Encounter (Signed)
I have received notification from [facility] that they have been unsuccessful in reaching the patient about nebulizer medication.  [facility] is not able to ship this medication without speaking with the patient.  [facility] pharmacy is requesting to have the patient call them directly at [phone].

## 2023-08-08 NOTE — Addendum Note (Signed)
Addended byClyda Greener M on: 08/08/2023 05:01 PM   Modules accepted: Orders

## 2023-08-08 NOTE — Telephone Encounter (Signed)
I called pt and spoke with his daughter, [name] (DPR) Pt daughter states she spoke with [facility] pharmacy today and the pharmacy stated they need a P.A. Pt is currently being seen by Dr Vassie Loll right now and I informed pt daughter I would have this handled and sent to the P.A team. NFN

## 2023-08-09 ENCOUNTER — Other Ambulatory Visit (HOSPITAL_COMMUNITY): Payer: Self-pay

## 2023-08-09 NOTE — Telephone Encounter (Signed)
Nebulizer solutions are covered under Medicare Part B-if a PA is needed through Medicare Part B the office will have to complete.

## 2023-08-10 ENCOUNTER — Encounter (HOSPITAL_COMMUNITY): Payer: Medicare Other

## 2023-08-10 NOTE — Telephone Encounter (Signed)
According to Dr. Reginia Naas last office note he had ordered this can we check to see if this went through

## 2023-08-11 ENCOUNTER — Emergency Department (HOSPITAL_COMMUNITY): Payer: Medicare Other

## 2023-08-11 ENCOUNTER — Emergency Department (HOSPITAL_COMMUNITY)
Admission: EM | Admit: 2023-08-11 | Discharge: 2023-08-11 | Disposition: A | Discharge status: Home / Self Care | Payer: Medicare Other | Source: Home / Self Care | Attending: Emergency Medicine | Admitting: Emergency Medicine

## 2023-08-11 ENCOUNTER — Other Ambulatory Visit: Payer: Self-pay

## 2023-08-11 DIAGNOSIS — J9601 Acute respiratory failure with hypoxia: Secondary | ICD-10-CM | POA: Diagnosis not present

## 2023-08-11 DIAGNOSIS — R7989 Other specified abnormal findings of blood chemistry: Secondary | ICD-10-CM | POA: Insufficient documentation

## 2023-08-11 DIAGNOSIS — J449 Chronic obstructive pulmonary disease, unspecified: Secondary | ICD-10-CM | POA: Insufficient documentation

## 2023-08-11 DIAGNOSIS — R0602 Shortness of breath: Secondary | ICD-10-CM | POA: Insufficient documentation

## 2023-08-11 DIAGNOSIS — I5022 Chronic systolic (congestive) heart failure: Secondary | ICD-10-CM | POA: Insufficient documentation

## 2023-08-11 DIAGNOSIS — Z8673 Personal history of transient ischemic attack (TIA), and cerebral infarction without residual deficits: Secondary | ICD-10-CM | POA: Insufficient documentation

## 2023-08-11 DIAGNOSIS — Z8551 Personal history of malignant neoplasm of bladder: Secondary | ICD-10-CM | POA: Insufficient documentation

## 2023-08-11 DIAGNOSIS — I110 Hypertensive heart disease with heart failure: Secondary | ICD-10-CM | POA: Insufficient documentation

## 2023-08-11 DIAGNOSIS — J441 Chronic obstructive pulmonary disease with (acute) exacerbation: Secondary | ICD-10-CM | POA: Diagnosis not present

## 2023-08-11 DIAGNOSIS — Z87891 Personal history of nicotine dependence: Secondary | ICD-10-CM | POA: Insufficient documentation

## 2023-08-11 DIAGNOSIS — Z20822 Contact with and (suspected) exposure to covid-19: Secondary | ICD-10-CM | POA: Insufficient documentation

## 2023-08-11 LAB — BASIC METABOLIC PANEL
Anion gap: 10 (ref 5–15)
BUN: 11 mg/dL (ref 8–23)
CO2: 27 mmol/L (ref 22–32)
Calcium: 8.9 mg/dL (ref 8.9–10.3)
Chloride: 103 mmol/L (ref 98–111)
Creatinine, Ser: 0.84 mg/dL (ref 0.61–1.24)
GFR, Estimated: >60 mL/min (ref >60)
Glucose, Bld: 113 mg/dL — ABNORMAL HIGH (ref 70–99)
Potassium: 3 mmol/L — ABNORMAL LOW (ref 3.5–5.1)
Sodium: 140 mmol/L (ref 135–145)

## 2023-08-11 LAB — CBC WITH DIFFERENTIAL/PLATELET
Abs Immature Granulocytes: 0.05 10*3/uL (ref 0.00–0.07)
Basophils Absolute: 0 10*3/uL (ref 0.0–0.1)
Basophils Relative: 0 %
Eosinophils Absolute: 0.2 10*3/uL (ref 0.0–0.5)
Eosinophils Relative: 3 %
HCT: 37.1 % — ABNORMAL LOW (ref 39.0–52.0)
Hemoglobin: 11.6 g/dL — ABNORMAL LOW (ref 13.0–17.0)
Immature Granulocytes: 1 %
Lymphocytes Relative: 12 %
Lymphs Abs: 0.8 10*3/uL (ref 0.7–4.0)
MCH: 30.4 pg (ref 26.0–34.0)
MCHC: 31.3 g/dL (ref 30.0–36.0)
MCV: 97.4 fL (ref 80.0–100.0)
Monocytes Absolute: 1.1 10*3/uL — ABNORMAL HIGH (ref 0.1–1.0)
Monocytes Relative: 14 %
Neutro Abs: 5.1 10*3/uL (ref 1.7–7.7)
Neutrophils Relative %: 70 %
Platelets: 384 10*3/uL (ref 150–400)
RBC: 3.81 MIL/uL — ABNORMAL LOW (ref 4.22–5.81)
RDW: 13.6 % (ref 11.5–15.5)
WBC: 7.3 10*3/uL (ref 4.0–10.5)
nRBC: 0 % (ref 0.0–0.2)

## 2023-08-11 LAB — RESP PANEL BY RT-PCR (RSV, FLU A&B, COVID)  RVPGX2
Influenza A by PCR: NEGATIVE
Influenza B by PCR: NEGATIVE
Resp Syncytial Virus by PCR: NEGATIVE
SARS Coronavirus 2 by RT PCR: NEGATIVE

## 2023-08-11 LAB — BLOOD GAS, VENOUS
Acid-Base Excess: 6.4 mmol/L — ABNORMAL HIGH (ref 0.0–2.0)
Bicarbonate: 31.3 mmol/L — ABNORMAL HIGH (ref 20.0–28.0)
O2 Saturation: 75.2 %
Patient temperature: 37
pCO2, Ven: 45 mm[Hg] (ref 44–60)
pH, Ven: 7.45 — ABNORMAL HIGH (ref 7.25–7.43)
pO2, Ven: 41 mm[Hg] (ref 32–45)

## 2023-08-11 LAB — BRAIN NATRIURETIC PEPTIDE: B Natriuretic Peptide: 333 pg/mL — ABNORMAL HIGH (ref 0.0–100.0)

## 2023-08-11 MED ORDER — FUROSEMIDE 10 MG/ML IJ SOLN
20.0000 mg | Freq: Once | INTRAMUSCULAR | Status: AC
  Administered 2023-08-11: 20 mg via INTRAVENOUS
  Filled 2023-08-11: qty 4

## 2023-08-11 MED ORDER — POTASSIUM CHLORIDE CRYS ER 20 MEQ PO TBCR
40.0000 meq | EXTENDED_RELEASE_TABLET | Freq: Once | ORAL | Status: AC
  Administered 2023-08-11: 40 meq via ORAL
  Filled 2023-08-11: qty 2

## 2023-08-11 MED ORDER — METHYLPREDNISOLONE SODIUM SUCC 125 MG IJ SOLR
125.0000 mg | Freq: Once | INTRAMUSCULAR | Status: AC
  Administered 2023-08-11: 125 mg via INTRAVENOUS
  Filled 2023-08-11: qty 2

## 2023-08-11 MED ORDER — IPRATROPIUM-ALBUTEROL 0.5-2.5 (3) MG/3ML IN SOLN
3.0000 mL | Freq: Once | RESPIRATORY_TRACT | Status: AC
  Administered 2023-08-11: 3 mL via RESPIRATORY_TRACT
  Filled 2023-08-11: qty 3

## 2023-08-11 NOTE — Discharge Instructions (Addendum)
You were evaluated in the Emergency Department and after careful evaluation, we did not find any emergent condition requiring admission or further testing in the hospital.  Your exam/testing today is overall reassuring.  Symptoms may be due to COPD or small amount of fluid on your lungs.  Recommend close follow-up with your primary care doctor to discuss your symptoms.  Please return to the Emergency Department if you experience any worsening of your condition.   Thank you for allowing [place] to be a part of your care.

## 2023-08-11 NOTE — ED Provider Notes (Signed)
[facility]-EMERGENCY DEPT [facility] Emergency Department Provider Note MRN:  [id number]  Arrival date & time: 08/11/23     Chief Complaint   Shortness of Breath   History of Present Illness   Matthew Hall is a 78 y.o. year-old male with a history of A-fib, COPD, CHF, stroke presenting to the ED with chief complaint of shortness of breath.  Explains that his breathing flares up from time to time due to his COPD.  Felt short of breath mostly when laying flat trying to sleep this evening, here for evaluation.  Found to be 86% on room air on arrival.  Denies chest pain, no leg pain or swelling, mild dry cough, no fever.  Review of Systems  A thorough review of systems was obtained and all systems are negative except as noted in the HPI and PMH.   Patient's Health History    Past Medical History:  Diagnosis Date   Anxiety    Aortic atherosclerosis (HCC)    Atrial fibrillation (HCC)    Basal cell carcinoma    Bladder cancer (HCC)    Bladder tumor    Chronic systolic heart failure (HCC)    Colon polyps    COPD (chronic obstructive pulmonary disease) (HCC)    Cutaneous vasculitis    Diabetic peripheral neuropathy (HCC) 12/27/2020   Diverticulosis    Essential hypertension    GERD (gastroesophageal reflux disease)    History of pneumonia    Nephrolithiasis    Osteoarthritis    Right foot drop    Stroke [facility])     Past Surgical History:  Procedure Laterality Date   BRONCHIAL WASHINGS  06/19/2023   Procedure: BRONCHIAL WASHINGS;  Surgeon: Steffanie Dunn, DO;  Location: [facility] ENDOSCOPY;  Service: Cardiopulmonary;;   COLONOSCOPY     Kidney stones removed  1996   SKIN CANCER EXCISION Left    2003   TRANSURETHRAL RESECTION OF BLADDER TUMOR  1996   VIDEO BRONCHOSCOPY N/A 06/19/2023   Procedure: VIDEO BRONCHOSCOPY WITHOUT FLUORO;  Surgeon: Steffanie Dunn, DO;  Location: [facility] ENDOSCOPY;  Service: Cardiopulmonary;  Laterality: N/A;    Family History  Problem Relation Age of Onset    Diabetes Mother    CAD Mother    Emphysema Father    Diabetes Sister    Stroke Sister    Lung cancer Brother    Liver cancer Brother    Colon cancer Neg Hx     Social History   Socioeconomic History   Marital status: Married    Spouse name: Not on file   Number of children: 3   Years of education: Not on file   Highest education level: Not on file  Occupational History   Occupation: Pipefilter/retired    Comment: [employer]  Tobacco Use   Smoking status: Former    Current packs/day: 0.00    Average packs/day: 1 pack/day for 30.0 years (30.0 ttl pk-yrs)    Types: Cigarettes    Start date: 07/06/1981    Quit date: 07/07/2011    Years since quitting: 12.1   Smokeless tobacco: Never  Vaping Use   Vaping status: Never Used  Substance and Sexual Activity   Alcohol use: No    Alcohol/week: 0.0 standard drinks of alcohol   Drug use: No   Sexual activity: Yes  Other Topics Concern   Not on file  Social History Narrative   Lives with wife   Caffeine- 1 cup a day   Right handed    Social  Drivers of Corporate investment banker Strain: Not on file  Food Insecurity: No Food Insecurity (07/15/2023)   Hunger Vital Sign    Worried About Running Out of Food in the Last Year: Never true    Ran Out of Food in the Last Year: Never true  Recent Concern: Food Insecurity - Food Insecurity Present (06/18/2023)   Hunger Vital Sign    Worried About Running Out of Food in the Last Year: Sometimes true    Ran Out of Food in the Last Year: Sometimes true  Transportation Needs: No Transportation Needs (07/15/2023)   PRAPARE - Administrator, Civil Service (Medical): No    Lack of Transportation (Non-Medical): No  Recent Concern: Transportation Needs - Unmet Transportation Needs (07/13/2023)   PRAPARE - Administrator, Civil Service (Medical): Yes    Lack of Transportation (Non-Medical): Yes  Physical Activity: Not on file  Stress: Not on file  Social Connections:  Moderately Isolated (07/13/2023)   Social Connection and Isolation Panel [NHANES]    Frequency of Communication with Friends and Family: More than three times a week    Frequency of Social Gatherings with Friends and Family: Once a week    Attends Religious Services: Never    Database administrator or Organizations: No    Attends Banker Meetings: Never    Marital Status: Married  Catering manager Violence: Not At Risk (07/15/2023)   Humiliation, Afraid, Rape, and Kick questionnaire    Fear of Current or Ex-Partner: No    Emotionally Abused: No    Physically Abused: No    Sexually Abused: No     Physical Exam   Vitals:   08/11/23 0315 08/11/23 0330  BP: 126/82 130/78  Pulse: 83 88  Resp: (!) 22 (!) 25  Temp:    SpO2: 93% 94%    CONSTITUTIONAL: Chronically ill-appearing, NAD NEURO/PSYCH:  Alert and oriented x 3, no focal deficits EYES:  eyes equal and reactive ENT/NECK:  no LAD, no JVD CARDIO: Regular rate, well-perfused, normal S1 and S2 PULM:  CTAB no wheezing or rhonchi GI/GU:  non-distended, non-tender MSK/SPINE:  No gross deformities, no edema SKIN:  no rash, atraumatic   *Additional and/or pertinent findings included in MDM below  Diagnostic and Interventional Summary    EKG Interpretation Date/Time:  Saturday August 11 2023 01:12:15 EST Ventricular Rate:  90 PR Interval:  132 QRS Duration:  130 QT Interval:  393 QTC Calculation: 481 R Axis:   254  Text Interpretation: Sinus rhythm Nonspecific IVCD with LAD Anterolateral infarct, old Confirmed by Kennis Carina [phone]) on 08/11/2023 1:30:37 AM       Labs Reviewed  BASIC METABOLIC PANEL - Abnormal; Notable for the following components:      Result Value   Potassium 3.0 (*)    Glucose, Bld 113 (*)    All other components within normal limits  BRAIN NATRIURETIC PEPTIDE - Abnormal; Notable for the following components:   B Natriuretic Peptide 333.0 (*)    All other components within normal  limits  CBC WITH DIFFERENTIAL/PLATELET - Abnormal; Notable for the following components:   RBC 3.81 (*)    Hemoglobin 11.6 (*)    HCT 37.1 (*)    Monocytes Absolute 1.1 (*)    All other components within normal limits  BLOOD GAS, VENOUS - Abnormal; Notable for the following components:   pH, Ven 7.45 (*)    Bicarbonate 31.3 (*)    Acid-Base  Excess 6.4 (*)    All other components within normal limits  RESP PANEL BY RT-PCR (RSV, FLU A&B, COVID)  [id number]    DG Chest Port 1 View  Final Result      Medications  ipratropium-albuterol (DUONEB) 0.5-2.5 (3) MG/3ML nebulizer solution 3 mL (3 mLs Nebulization Given 08/11/23 0133)  methylPREDNISolone sodium succinate (SOLU-MEDROL) 125 mg/2 mL injection 125 mg (125 mg Intravenous Given 08/11/23 0133)  furosemide (LASIX) injection 20 mg (20 mg Intravenous Given 08/11/23 0301)  potassium chloride SA (KLOR-CON M) CR tablet 40 mEq (40 mEq Oral Given 08/11/23 0302)     Procedures  /  Critical Care Procedures  ED Course and Medical Decision Making  Initial Impression and Ddx Differential diagnosis includes COPD exacerbation, CHF exacerbation, flu, pneumonia.  No leg pain or swelling, no chest pain, no tachycardia, low concern for PE.  Past medical/surgical history that increases complexity of ED encounter: CHF, COPD  Interpretation of Diagnostics I personally reviewed the EKG and my interpretation is as follows: Sinus rhythm without significant changes  Labs reassuring with no significant blood count or electrolyte disturbance, mildly elevated BNP  Patient Reassessment and Ultimate Disposition/Management     Patient feels back to normal, continues to deny any chest pain, no longer having any shortness of breath.  Saturations 95% on room air.  Was given small dose of Lasix and possibly this has helped, good urine output.  No real indication for further testing or admission at this time, advised to follow-up closely with PCP, strict return  precautions.  Patient management required discussion with the following services or consulting groups:  None  Complexity of Problems Addressed Acute illness or injury that poses threat of life of bodily function  Additional Data Reviewed and Analyzed Further history obtained from: Prior labs/imaging results  Additional Factors Impacting ED Encounter Risk Consideration of hospitalization  Elmer Sow. Pilar Plate, MD [facility] Emergency Medicine [facility] [email or web address]  Final Clinical Impressions(s) / ED Diagnoses     ICD-10-CM   1. Shortness of breath  R06.02       ED Discharge Orders     None        Discharge Instructions Discussed with and Provided to Patient:     Discharge Instructions      You were evaluated in the Emergency Department and after careful evaluation, we did not find any emergent condition requiring admission or further testing in the hospital.  Your exam/testing today is overall reassuring.  Symptoms may be due to COPD or small amount of fluid on your lungs.  Recommend close follow-up with your primary care doctor to discuss your symptoms.  Please return to the Emergency Department if you experience any worsening of your condition.   Thank you for allowing [place] to be a part of your care.       Sabas Sous, MD 08/11/23 [phone]

## 2023-08-11 NOTE — ED Triage Notes (Signed)
PT BIBA for SHOB x1 wk. On arrival pt was 86% on RA, placed on 3L nasal cannula and came up to 95%.

## 2023-08-12 ENCOUNTER — Inpatient Hospital Stay (HOSPITAL_COMMUNITY)
Admission: EM | Admit: 2023-08-12 | Discharge: 2023-08-17 | DRG: 189 | Disposition: A | Discharge status: Home / Self Care | Payer: Medicare Other | Attending: Internal Medicine | Admitting: Internal Medicine

## 2023-08-12 ENCOUNTER — Emergency Department (HOSPITAL_COMMUNITY): Payer: Medicare Other

## 2023-08-12 ENCOUNTER — Encounter (HOSPITAL_COMMUNITY): Payer: Self-pay

## 2023-08-12 ENCOUNTER — Other Ambulatory Visit: Payer: Self-pay

## 2023-08-12 DIAGNOSIS — Z882 Allergy status to sulfonamides status: Secondary | ICD-10-CM

## 2023-08-12 DIAGNOSIS — Z8551 Personal history of malignant neoplasm of bladder: Secondary | ICD-10-CM

## 2023-08-12 DIAGNOSIS — Z87891 Personal history of nicotine dependence: Secondary | ICD-10-CM

## 2023-08-12 DIAGNOSIS — I110 Hypertensive heart disease with heart failure: Secondary | ICD-10-CM | POA: Diagnosis present

## 2023-08-12 DIAGNOSIS — Z8701 Personal history of pneumonia (recurrent): Secondary | ICD-10-CM

## 2023-08-12 DIAGNOSIS — J441 Chronic obstructive pulmonary disease with (acute) exacerbation: Principal | ICD-10-CM | POA: Diagnosis present

## 2023-08-12 DIAGNOSIS — E1142 Type 2 diabetes mellitus with diabetic polyneuropathy: Secondary | ICD-10-CM | POA: Diagnosis present

## 2023-08-12 DIAGNOSIS — F419 Anxiety disorder, unspecified: Secondary | ICD-10-CM | POA: Diagnosis present

## 2023-08-12 DIAGNOSIS — Z7951 Long term (current) use of inhaled steroids: Secondary | ICD-10-CM

## 2023-08-12 DIAGNOSIS — K219 Gastro-esophageal reflux disease without esophagitis: Secondary | ICD-10-CM | POA: Diagnosis present

## 2023-08-12 DIAGNOSIS — Z91040 Latex allergy status: Secondary | ICD-10-CM

## 2023-08-12 DIAGNOSIS — Z833 Family history of diabetes mellitus: Secondary | ICD-10-CM

## 2023-08-12 DIAGNOSIS — Z860100 Personal history of colon polyps, unspecified: Secondary | ICD-10-CM

## 2023-08-12 DIAGNOSIS — Z5982 Transportation insecurity: Secondary | ICD-10-CM

## 2023-08-12 DIAGNOSIS — Z801 Family history of malignant neoplasm of trachea, bronchus and lung: Secondary | ICD-10-CM

## 2023-08-12 DIAGNOSIS — I480 Paroxysmal atrial fibrillation: Secondary | ICD-10-CM | POA: Diagnosis present

## 2023-08-12 DIAGNOSIS — Z8249 Family history of ischemic heart disease and other diseases of the circulatory system: Secondary | ICD-10-CM

## 2023-08-12 DIAGNOSIS — Z1152 Encounter for screening for COVID-19: Secondary | ICD-10-CM

## 2023-08-12 DIAGNOSIS — Z87442 Personal history of urinary calculi: Secondary | ICD-10-CM

## 2023-08-12 DIAGNOSIS — E876 Hypokalemia: Secondary | ICD-10-CM | POA: Diagnosis present

## 2023-08-12 DIAGNOSIS — A310 Pulmonary mycobacterial infection: Secondary | ICD-10-CM | POA: Diagnosis present

## 2023-08-12 DIAGNOSIS — Z823 Family history of stroke: Secondary | ICD-10-CM

## 2023-08-12 DIAGNOSIS — Z91041 Radiographic dye allergy status: Secondary | ICD-10-CM

## 2023-08-12 DIAGNOSIS — I5022 Chronic systolic (congestive) heart failure: Secondary | ICD-10-CM | POA: Diagnosis present

## 2023-08-12 DIAGNOSIS — Z800 Family history of malignant neoplasm of digestive organs: Secondary | ICD-10-CM

## 2023-08-12 DIAGNOSIS — Z8673 Personal history of transient ischemic attack (TIA), and cerebral infarction without residual deficits: Secondary | ICD-10-CM

## 2023-08-12 DIAGNOSIS — I4891 Unspecified atrial fibrillation: Secondary | ICD-10-CM

## 2023-08-12 DIAGNOSIS — R0602 Shortness of breath: Secondary | ICD-10-CM

## 2023-08-12 DIAGNOSIS — R29898 Other symptoms and signs involving the musculoskeletal system: Secondary | ICD-10-CM

## 2023-08-12 DIAGNOSIS — Z85828 Personal history of other malignant neoplasm of skin: Secondary | ICD-10-CM

## 2023-08-12 DIAGNOSIS — J479 Bronchiectasis, uncomplicated: Secondary | ICD-10-CM | POA: Diagnosis present

## 2023-08-12 DIAGNOSIS — I2489 Other forms of acute ischemic heart disease: Secondary | ICD-10-CM | POA: Diagnosis present

## 2023-08-12 DIAGNOSIS — Z7984 Long term (current) use of oral hypoglycemic drugs: Secondary | ICD-10-CM

## 2023-08-12 DIAGNOSIS — Z825 Family history of asthma and other chronic lower respiratory diseases: Secondary | ICD-10-CM

## 2023-08-12 DIAGNOSIS — J471 Bronchiectasis with (acute) exacerbation: Secondary | ICD-10-CM

## 2023-08-12 DIAGNOSIS — Z888 Allergy status to other drugs, medicaments and biological substances status: Secondary | ICD-10-CM

## 2023-08-12 DIAGNOSIS — Z79899 Other long term (current) drug therapy: Secondary | ICD-10-CM

## 2023-08-12 DIAGNOSIS — J9601 Acute respiratory failure with hypoxia: Principal | ICD-10-CM | POA: Diagnosis present

## 2023-08-12 DIAGNOSIS — Z7901 Long term (current) use of anticoagulants: Secondary | ICD-10-CM

## 2023-08-12 LAB — COMPREHENSIVE METABOLIC PANEL
ALT: 16 U/L (ref 0–44)
AST: 21 U/L (ref 15–41)
Albumin: 3.2 g/dL — ABNORMAL LOW (ref 3.5–5.0)
Alkaline Phosphatase: 58 U/L (ref 38–126)
Anion gap: 12 (ref 5–15)
BUN: 18 mg/dL (ref 8–23)
CO2: 25 mmol/L (ref 22–32)
Calcium: 9 mg/dL (ref 8.9–10.3)
Chloride: 102 mmol/L (ref 98–111)
Creatinine, Ser: 0.72 mg/dL (ref 0.61–1.24)
GFR, Estimated: >60 mL/min (ref >60)
Glucose, Bld: 150 mg/dL — ABNORMAL HIGH (ref 70–99)
Potassium: 3.1 mmol/L — ABNORMAL LOW (ref 3.5–5.1)
Sodium: 139 mmol/L (ref 135–145)
Total Bilirubin: 0.7 mg/dL (ref 0.0–1.2)
Total Protein: 7 g/dL (ref 6.5–8.1)

## 2023-08-12 LAB — CBC WITH DIFFERENTIAL/PLATELET
Abs Immature Granulocytes: 0.09 10*3/uL — ABNORMAL HIGH (ref 0.00–0.07)
Basophils Absolute: 0 10*3/uL (ref 0.0–0.1)
Basophils Relative: 0 %
Eosinophils Absolute: 0 10*3/uL (ref 0.0–0.5)
Eosinophils Relative: 0 %
HCT: 38.1 % — ABNORMAL LOW (ref 39.0–52.0)
Hemoglobin: 11.9 g/dL — ABNORMAL LOW (ref 13.0–17.0)
Immature Granulocytes: 1 %
Lymphocytes Relative: 5 %
Lymphs Abs: 0.8 10*3/uL (ref 0.7–4.0)
MCH: 30.1 pg (ref 26.0–34.0)
MCHC: 31.2 g/dL (ref 30.0–36.0)
MCV: 96.5 fL (ref 80.0–100.0)
Monocytes Absolute: 0.9 10*3/uL (ref 0.1–1.0)
Monocytes Relative: 6 %
Neutro Abs: 13.6 10*3/uL — ABNORMAL HIGH (ref 1.7–7.7)
Neutrophils Relative %: 88 %
Platelets: 450 10*3/uL — ABNORMAL HIGH (ref 150–400)
RBC: 3.95 MIL/uL — ABNORMAL LOW (ref 4.22–5.81)
RDW: 13.5 % (ref 11.5–15.5)
WBC: 15.5 10*3/uL — ABNORMAL HIGH (ref 4.0–10.5)
nRBC: 0 % (ref 0.0–0.2)

## 2023-08-12 LAB — RESPIRATORY PANEL BY PCR
Adenovirus: NOT DETECTED
Bordetella Parapertussis: NOT DETECTED
Bordetella pertussis: NOT DETECTED
Chlamydophila pneumoniae: NOT DETECTED
Coronavirus 229E: NOT DETECTED
Coronavirus HKU1: NOT DETECTED
Coronavirus NL63: NOT DETECTED
Coronavirus OC43: NOT DETECTED
Influenza A: UNDETERMINED — AB
Influenza B: NOT DETECTED
Metapneumovirus: NOT DETECTED
Mycoplasma pneumoniae: NOT DETECTED
Parainfluenza Virus 1: NOT DETECTED
Parainfluenza Virus 2: NOT DETECTED
Parainfluenza Virus 3: NOT DETECTED
Parainfluenza Virus 4: NOT DETECTED
Respiratory Syncytial Virus: NOT DETECTED
Rhinovirus / Enterovirus: NOT DETECTED

## 2023-08-12 LAB — URINALYSIS, W/ REFLEX TO CULTURE (INFECTION SUSPECTED)
Bacteria, UA: NONE SEEN
Bilirubin Urine: NEGATIVE
Glucose, UA: NEGATIVE mg/dL
Ketones, ur: NEGATIVE mg/dL
Leukocytes,Ua: NEGATIVE
Nitrite: NEGATIVE
Protein, ur: NEGATIVE mg/dL
Specific Gravity, Urine: 1.017 (ref 1.005–1.030)
pH: 5 (ref 5.0–8.0)

## 2023-08-12 LAB — RESP PANEL BY RT-PCR (RSV, FLU A&B, COVID)  RVPGX2
Influenza A by PCR: NEGATIVE
Influenza B by PCR: NEGATIVE
Resp Syncytial Virus by PCR: NEGATIVE
SARS Coronavirus 2 by RT PCR: NEGATIVE

## 2023-08-12 LAB — D-DIMER, QUANTITATIVE: D-Dimer, Quant: <0.27 ug{FEU}/mL (ref 0.00–0.50)

## 2023-08-12 LAB — CBG MONITORING, ED: Glucose-Capillary: 159 mg/dL — ABNORMAL HIGH (ref 70–99)

## 2023-08-12 LAB — I-STAT CG4 LACTIC ACID, ED
Lactic Acid, Venous: 2.3 mmol/L (ref 0.5–1.9)
Lactic Acid, Venous: 2.5 mmol/L (ref 0.5–1.9)

## 2023-08-12 LAB — BRAIN NATRIURETIC PEPTIDE: B Natriuretic Peptide: 222.1 pg/mL — ABNORMAL HIGH (ref 0.0–100.0)

## 2023-08-12 LAB — TROPONIN I (HIGH SENSITIVITY)
Troponin I (High Sensitivity): 72 ng/L — ABNORMAL HIGH (ref <18)
Troponin I (High Sensitivity): 151 ng/L (ref <18)

## 2023-08-12 LAB — GLUCOSE, CAPILLARY: Glucose-Capillary: 168 mg/dL — ABNORMAL HIGH (ref 70–99)

## 2023-08-12 MED ORDER — APIXABAN 5 MG PO TABS
5.0000 mg | ORAL_TABLET | Freq: Two times a day (BID) | ORAL | Status: DC
  Administered 2023-08-12 – 2023-08-17 (×10): 5 mg via ORAL
  Filled 2023-08-12 (×10): qty 1

## 2023-08-12 MED ORDER — SODIUM CHLORIDE 0.9 % IV BOLUS: 1000.0000 mL | Freq: Once | INTRAVENOUS | Status: DC

## 2023-08-12 MED ORDER — ACETAMINOPHEN 325 MG PO TABS
650.0000 mg | ORAL_TABLET | Freq: Four times a day (QID) | ORAL | Status: DC | PRN
  Administered 2023-08-12 – 2023-08-15 (×4): 650 mg via ORAL
  Filled 2023-08-12 (×4): qty 2

## 2023-08-12 MED ORDER — TRIAMCINOLONE ACETONIDE 55 MCG/ACT NA AERO
1.0000 | INHALATION_SPRAY | Freq: Every day | NASAL | Status: DC
  Administered 2023-08-13 – 2023-08-17 (×5): 1 via NASAL
  Filled 2023-08-12: qty 10.8

## 2023-08-12 MED ORDER — SODIUM CHLORIDE 0.9 % IV BOLUS
500.0000 mL | Freq: Once | INTRAVENOUS | Status: AC
  Administered 2023-08-12: 500 mL via INTRAVENOUS

## 2023-08-12 MED ORDER — LEVALBUTEROL HCL 0.63 MG/3ML IN NEBU
0.6300 mg | INHALATION_SOLUTION | Freq: Once | RESPIRATORY_TRACT | Status: AC
  Administered 2023-08-12: 0.63 mg via RESPIRATORY_TRACT
  Filled 2023-08-12: qty 3

## 2023-08-12 MED ORDER — INSULIN ASPART 100 UNIT/ML IJ SOLN
0.0000 [IU] | Freq: Three times a day (TID) | INTRAMUSCULAR | Status: DC
  Administered 2023-08-12 – 2023-08-14 (×5): 3 [IU] via SUBCUTANEOUS
  Administered 2023-08-15 – 2023-08-16 (×2): 5 [IU] via SUBCUTANEOUS
  Administered 2023-08-16: 3 [IU] via SUBCUTANEOUS
  Administered 2023-08-17: 8 [IU] via SUBCUTANEOUS
  Filled 2023-08-12: qty 0.15

## 2023-08-12 MED ORDER — ENSURE ENLIVE PO LIQD
237.0000 mL | Freq: Two times a day (BID) | ORAL | Status: DC
  Administered 2023-08-13 – 2023-08-17 (×8): 237 mL via ORAL

## 2023-08-12 MED ORDER — METOPROLOL SUCCINATE ER 25 MG PO TB24
25.0000 mg | ORAL_TABLET | Freq: Two times a day (BID) | ORAL | Status: DC
  Administered 2023-08-12 – 2023-08-17 (×10): 25 mg via ORAL
  Filled 2023-08-12 (×10): qty 1

## 2023-08-12 MED ORDER — ACETAMINOPHEN 650 MG RE SUPP: 650.0000 mg | Freq: Four times a day (QID) | RECTAL | Status: DC | PRN

## 2023-08-12 MED ORDER — SODIUM CHLORIDE 0.9 % IV SOLN
1.0000 g | Freq: Once | INTRAVENOUS | Status: AC
  Administered 2023-08-12: 1 g via INTRAVENOUS
  Filled 2023-08-12: qty 10

## 2023-08-12 MED ORDER — GUAIFENESIN ER 600 MG PO TB12
600.0000 mg | ORAL_TABLET | Freq: Two times a day (BID) | ORAL | Status: DC
Start: 2023-08-12 — End: 2023-08-17
  Administered 2023-08-12 – 2023-08-17 (×10): 600 mg via ORAL
  Filled 2023-08-12 (×10): qty 1

## 2023-08-12 MED ORDER — IPRATROPIUM BROMIDE 0.02 % IN SOLN
0.5000 mg | Freq: Four times a day (QID) | RESPIRATORY_TRACT | Status: DC
  Administered 2023-08-12 – 2023-08-17 (×21): 0.5 mg via RESPIRATORY_TRACT
  Filled 2023-08-12 (×21): qty 2.5

## 2023-08-12 MED ORDER — SODIUM CHLORIDE 0.9 % IV BOLUS: 500.0000 mL | Freq: Once | INTRAVENOUS | Status: DC

## 2023-08-12 MED ORDER — POTASSIUM CHLORIDE CRYS ER 20 MEQ PO TBCR
40.0000 meq | EXTENDED_RELEASE_TABLET | Freq: Once | ORAL | Status: AC
  Administered 2023-08-12: 40 meq via ORAL
  Filled 2023-08-12: qty 2

## 2023-08-12 MED ORDER — SODIUM CHLORIDE 0.9 % IV SOLN
500.0000 mg | INTRAVENOUS | Status: AC
  Administered 2023-08-13 – 2023-08-16 (×4): 500 mg via INTRAVENOUS
  Filled 2023-08-12 (×4): qty 5

## 2023-08-12 MED ORDER — PANTOPRAZOLE SODIUM 40 MG PO TBEC
40.0000 mg | DELAYED_RELEASE_TABLET | Freq: Every day | ORAL | Status: DC
  Administered 2023-08-13 – 2023-08-17 (×5): 40 mg via ORAL
  Filled 2023-08-12 (×5): qty 1

## 2023-08-12 MED ORDER — ALPRAZOLAM 0.25 MG PO TABS
0.2500 mg | ORAL_TABLET | Freq: Two times a day (BID) | ORAL | Status: DC | PRN
  Administered 2023-08-12 – 2023-08-16 (×6): 0.25 mg via ORAL
  Filled 2023-08-12 (×6): qty 1

## 2023-08-12 MED ORDER — SODIUM CHLORIDE 0.9 % IV SOLN
1.0000 g | INTRAVENOUS | Status: AC
  Administered 2023-08-13 – 2023-08-16 (×4): 1 g via INTRAVENOUS
  Filled 2023-08-12 (×4): qty 10

## 2023-08-12 MED ORDER — AZITHROMYCIN 250 MG PO TABS
500.0000 mg | ORAL_TABLET | Freq: Once | ORAL | Status: AC
  Administered 2023-08-12: 500 mg via ORAL
  Filled 2023-08-12: qty 2

## 2023-08-12 MED ORDER — METHYLPREDNISOLONE SODIUM SUCC 125 MG IJ SOLR
125.0000 mg | Freq: Once | INTRAMUSCULAR | Status: AC
  Administered 2023-08-12: 125 mg via INTRAVENOUS
  Filled 2023-08-12: qty 2

## 2023-08-12 MED ORDER — METHYLPREDNISOLONE SODIUM SUCC 40 MG IJ SOLR
40.0000 mg | Freq: Two times a day (BID) | INTRAMUSCULAR | Status: DC
  Administered 2023-08-13 – 2023-08-17 (×10): 40 mg via INTRAVENOUS
  Filled 2023-08-12 (×10): qty 1

## 2023-08-12 MED ORDER — INSULIN ASPART 100 UNIT/ML IJ SOLN
0.0000 [IU] | Freq: Every day | INTRAMUSCULAR | Status: DC
  Filled 2023-08-12: qty 0.05

## 2023-08-12 MED ORDER — LEVALBUTEROL HCL 0.63 MG/3ML IN NEBU
0.6300 mg | INHALATION_SOLUTION | Freq: Four times a day (QID) | RESPIRATORY_TRACT | Status: DC
  Administered 2023-08-12 – 2023-08-17 (×21): 0.63 mg via RESPIRATORY_TRACT
  Filled 2023-08-12 (×21): qty 3

## 2023-08-12 NOTE — ED Provider Notes (Signed)
[place] EMERGENCY DEPARTMENT AT [facility] Provider Note   CSN: [id number] Arrival date & time: 08/12/23  1104     History  Chief Complaint  Patient presents with   Shortness of Breath    Matthew Hall is a 78 y.o. male.   Shortness of Breath    Patient has a history of atrial fibrillation, COPD, bronchiectasis, bladder cancer, acid reflux, hypertension, pneumonia, stroke who presents ED for evaluation of shortness of breath.  Patient states he started having worsening symptoms a couple days ago.  He has noticed that he gets more short of breath especially when he is lying flat in the evening.  Patient was at home a couple days ago and started to feel short of breath.  He came to the ED for evaluation yesterday at around 1 AM.  Patient's ED workup was reassuring.  Patient was found to have oxygen saturations of 95% on room air.  Patient states he was feeling better initially but this morning he started feeling more short of breath again.  He also felt like he was developing low-grade fever.  He felt warm and flushed  Home Medications Prior to Admission medications   Medication Sig Start Date End Date Taking? Authorizing Provider  Acetaminophen (TYLENOL) 325 MG CAPS Take 1-2 capsules by mouth every 6 (six) hours as needed (pain).    [provider]  ALPRAZolam Prudy Feeler) 0.25 MG tablet Take 0.25 mg by mouth 2 (two) times daily as needed for anxiety or sleep.  07/01/14   [provider]  apixaban (ELIQUIS) 5 MG TABS tablet Take 1 tablet (5 mg total) by mouth 2 (two) times daily. 04/17/23   Zigmund Daniel., MD  arformoterol (BROVANA) 15 MCG/2ML NEBU Take 2 mLs (15 mcg total) by nebulization 2 (two) times daily. Patient not taking: Reported on 08/08/2023 07/17/23   [name], Virgel Bouquet, NP  benzonatate (TESSALON) 100 MG capsule Take 2 capsules (200 mg total) by mouth 3 (three) times daily as needed. 06/22/23   Dorcas Carrow, MD  budesonide (PULMICORT) 0.25  MG/2ML nebulizer solution Take 2 mLs (0.25 mg total) by nebulization 2 (two) times daily. 06/22/23   Dorcas Carrow, MD  clotrimazole-betamethasone (LOTRISONE) cream Apply 1 Application topically 2 (two) times daily. 04/04/23   [provider]  Cyanocobalamin (VITAMIN B12 PO) Take 1 tablet by mouth daily.    [provider]  ELDERBERRY PO Take 1 tablet by mouth daily.    [provider]  esomeprazole (NEXIUM) 40 MG capsule Take 40 mg by mouth daily at 12 noon.    [provider]  feeding supplement (ENSURE ENLIVE / ENSURE PLUS) LIQD Take 237 mLs by mouth 2 (two) times daily between meals. 07/15/23   [name], Rebekah Chesterfield, MD  guaiFENesin (MUCINEX) 600 MG 12 hr tablet Take 1 tablet (600 mg total) by mouth 2 (two) times daily. 08/08/23   Oretha Milch, MD  levalbuterol [facility] HFA) 45 MCG/ACT inhaler Inhale 2 puffs into the lungs every 4 (four) hours as needed for wheezing. 07/17/23   [name], Virgel Bouquet, NP  levalbuterol (XOPENEX) 0.63 MG/3ML nebulizer solution Take 3 mLs (0.63 mg total) by nebulization every 6 (six) hours as needed for wheezing or shortness of breath. 07/17/23   [name], Virgel Bouquet, NP  metoprolol succinate (TOPROL-XL) 25 MG 24 hr tablet TAKE 1 TABLET BY MOUTH TWICE A DAY Patient taking differently: Take 25-50 mg by mouth 2 (two) times daily. Alternate taking 25mg  by mouth twice daily and  50mg  by mouth twice daily. 06/01/23   Sharlene Dory, NP  Multiple Vitamin (MULTIVITAMIN WITH MINERALS) TABS tablet Take 1 tablet by mouth daily. 07/16/23   [name], Rebekah Chesterfield, MD  neomycin-polymyxin b-dexamethasone (MAXITROL) 3.5-10000-0.1 OINT Place 1 Application into both eyes at bedtime. 06/22/23   Dorcas Carrow, MD  sodium chloride HYPERTONIC 3 % nebulizer solution Take 4 mLs by nebulization 2 (two) times daily. 06/22/23   Dorcas Carrow, MD  triamcinolone (NASACORT) 55 MCG/ACT AERO nasal inhaler Place 1 spray into the nose daily.    [provider]  zinc gluconate 50 MG  tablet Take 50 mg by mouth 2 (two) times a week.    [provider]      Allergies    Simvastatin, Cortisone, Albuterol, Augmentin [amoxicillin-pot clavulanate], Ciprofloxacin, Contrast media [iodinated contrast media], Flagyl [metronidazole], Latex, and Sulfa antibiotics    Review of Systems   Review of Systems  Respiratory:  Positive for shortness of breath.     Physical Exam Updated Vital Signs BP 127/62   Pulse (!) 106   SpO2 95%  Physical Exam Vitals and nursing note reviewed.  Constitutional:      Appearance: He is well-developed. He is not diaphoretic.     Comments: Frail, underweight  HENT:     Head: Normocephalic and atraumatic.     Right Ear: External ear normal.     Left Ear: External ear normal.  Eyes:     General: No scleral icterus.       Right eye: No discharge.        Left eye: No discharge.     Conjunctiva/sclera: Conjunctivae normal.  Neck:     Trachea: No tracheal deviation.  Cardiovascular:     Rate and Rhythm: Tachycardia present. Rhythm irregular.  Pulmonary:     Effort: Accessory muscle usage present. No respiratory distress.     Breath sounds: No stridor. Rhonchi and rales present. No wheezing.  Abdominal:     General: Bowel sounds are normal. There is no distension.     Palpations: Abdomen is soft.     Tenderness: There is no abdominal tenderness. There is no guarding or rebound.  Musculoskeletal:        General: No tenderness or deformity.     Cervical back: Neck supple.     Right lower leg: No edema.     Left lower leg: No edema.  Skin:    General: Skin is warm and dry.     Findings: No rash.  Neurological:     General: No focal deficit present.     Mental Status: He is alert.     Cranial Nerves: No cranial nerve deficit, dysarthria or facial asymmetry.     Sensory: No sensory deficit.     Motor: No abnormal muscle tone or seizure activity.     Coordination: Coordination normal.  Psychiatric:        Mood and Affect: Mood  normal.     ED Results / Procedures / Treatments   Labs (all labs ordered are listed, but only abnormal results are displayed) Labs Reviewed  COMPREHENSIVE METABOLIC PANEL - Abnormal; Notable for the following components:      Result Value   Potassium 3.1 (*)    Glucose, Bld 150 (*)    Albumin 3.2 (*)    All other components within normal limits  CBC WITH DIFFERENTIAL/PLATELET - Abnormal; Notable for the following components:   WBC 15.5 (*)    RBC 3.95 (*)  Hemoglobin 11.9 (*)    HCT 38.1 (*)    Platelets 450 (*)    Neutro Abs 13.6 (*)    Abs Immature Granulocytes 0.09 (*)    All other components within normal limits  URINALYSIS, W/ REFLEX TO CULTURE (INFECTION SUSPECTED) - Abnormal; Notable for the following components:   Hgb urine dipstick SMALL (*)    All other components within normal limits  BRAIN NATRIURETIC PEPTIDE - Abnormal; Notable for the following components:   B Natriuretic Peptide 222.1 (*)    All other components within normal limits  I-STAT CG4 LACTIC ACID, ED - Abnormal; Notable for the following components:   Lactic Acid, Venous 2.3 (*)    All other components within normal limits  I-STAT CG4 LACTIC ACID, ED - Abnormal; Notable for the following components:   Lactic Acid, Venous 2.5 (*)    All other components within normal limits  TROPONIN I (HIGH SENSITIVITY) - Abnormal; Notable for the following components:   Troponin I (High Sensitivity) 72 (*)    All other components within normal limits  TROPONIN I (HIGH SENSITIVITY) - Abnormal; Notable for the following components:   Troponin I (High Sensitivity) 151 (*)    All other components within normal limits  RESP PANEL BY RT-PCR (RSV, FLU A&B, COVID)  RVPGX2  CULTURE, BLOOD (ROUTINE X 2)  CULTURE, BLOOD (ROUTINE X 2)  RESPIRATORY PANEL BY PCR  D-DIMER, QUANTITATIVE    EKG EKG Interpretation Date/Time:  Sunday August 12 2023 12:47:34 EST Ventricular Rate:  106 PR Interval:    QRS  Duration:  126 QT Interval:  364 QTC Calculation: 484 R Axis:   256  Text Interpretation: Atrial fibrillation Ventricular bigeminy Nonspecific IVCD with LAD Abnormal lateral Q waves Anterior infarct, old Since last tracing rate faster Confirmed by Linwood Dibbles [phone]) on 08/12/2023 12:52:50 PM  Radiology DG Chest 2 View Result Date: 08/12/2023 CLINICAL DATA:  Shortness of breath and weakness.  Cough. EXAM: CHEST - 2 VIEW COMPARISON:  08/11/2023 and 06/17/2023. FINDINGS: Heart is enlarged, stable. Biapical pleuroparenchymal scarring, right greater than left. Scattered micronodularity and streaky densities, right greater than left, corresponding to CT chest 06/17/2023 no definite superimposed airspace consolidation no pleural fluid. Osteopenia. Degenerative changes in the spine. IMPRESSION: 1. No definite acute findings. 2. Chronic mycobacterium avium complex. Electronically Signed   By: Leanna Battles M.D.   On: 08/12/2023 12:54   DG Chest Port 1 View Result Date: 08/11/2023 CLINICAL DATA:  Dyspnea EXAM: PORTABLE CHEST 1 VIEW COMPARISON:  07/12/2023 FINDINGS: The lungs are stably mildly hyperinflated in keeping with changes of underlying COPD. Stable asymmetric right apical pleural thickening. No focal pulmonary infiltrate. No pneumothorax or pleural effusion. Cardiac size within normal limits. Pulmonary vascularity is normal. No acute bone abnormality. IMPRESSION: 1. No active disease. COPD. Electronically Signed   By: Helyn Numbers M.D.   On: 08/11/2023 01:51    Procedures Procedures    Medications Ordered in ED Medications  cefTRIAXone (ROCEPHIN) 1 g in sodium chloride 0.9 % 100 mL IVPB (has no administration in time range)  methylPREDNISolone sodium succinate (SOLU-MEDROL) 125 mg/2 mL injection 125 mg (125 mg Intravenous Given 08/12/23 1442)  levalbuterol (XOPENEX) nebulizer solution 0.63 mg (0.63 mg Nebulization Given 08/12/23 1442)  sodium chloride 0.9 % bolus 500 mL (500 mLs Intravenous New  Bag/Given 08/12/23 1523)  azithromycin (ZITHROMAX) tablet 500 mg (500 mg Oral Given 08/12/23 1522)    ED Course/ Medical Decision Making/ A&P Clinical Course as of 08/12/23 1524  Sun Aug 12, 2023  1258 Brain natriuretic peptide(!) BNP increased to 222.  Decreased compared to previous [JK]  1418 COVID flu RSV negative. [JK]  1418 Troponin I (High Sensitivity)(!) Troponin increased [JK]  1418 Chest x-ray without acute abnormalities.  Chronic Mycobacterium avium complex noted [JK]  1508 Troponin increased compared to previous [JK]  1522 Case discussed with Dr Erenest Blank regarding admission [JK]    Clinical Course User Index [JK] Linwood Dibbles, MD                                 Medical Decision Making Differential diagnosis includes but not limited to CHF, COPD, PE  Problems Addressed: COPD exacerbation [facility]): acute illness or injury that poses a threat to life or bodily functions  Amount and/or Complexity of Data Reviewed Labs: ordered. Decision-making details documented in ED Course. Radiology: ordered and independent interpretation performed.  Risk Prescription drug management. Decision regarding hospitalization.   Patient presented to the ED for evaluation of increasing shortness of breath.  Patient was recently seen in on Friday night with similar symptoms.  Patient now having increasing cough and shortness of breath.  Patient's ED workup notable for increasing leukocytosis.  No definite acute findings on chest x-ray however patient does have significant chronic lung disease.  It is possible he may have an early pneumonia developing.  It also is possible he has bronchitis causing his increasing shortness of breath.  Patient does have slightly elevated lactic acid level.  Blood cultures have been ordered.  Will start him on antibiotics.  Troponin also  elevated as well as elevated BNP.  I suspect this is more related to his tachycardia initially but will continue to monitor.  Overall lower  suspicion for CHF ACS at this time.  With the patient's worsening symptoms and comorbidities I will consult the medical service for admission.        Final Clinical Impression(s) / ED Diagnoses Final diagnoses:  COPD exacerbation (HCC)  Atrial fibrillation, unspecified type [facility])    Rx / DC Orders ED Discharge Orders     None         Linwood Dibbles, MD 08/12/23 1524

## 2023-08-12 NOTE — ED Triage Notes (Addendum)
Pt BIB EMS from [facility]. Pt had an acute episode of shob and weakness this morning. Pt was recently seen Friday night. Pt has yellowish green productive cough.

## 2023-08-12 NOTE — H&P (Addendum)
History and Physical  Matthew Hall FAO:[id number] DOB: 1946/06/12 DOA: 08/12/2023  PCP: Alinda Deem, MD   Chief Complaint: Cough, shortness of breath  HPI: Matthew Hall is a 78 y.o. male with medical history significant for fibrillation on anticoagulation, chronic heart failure with reduced EF, GERD, hypertension, chronic bronchiectasis being admitted to the hospital with acute hypoxic respiratory failure and concern for acute exacerbation of COPD.  He was recently hospitalized in early January for acute exacerbation of COPD, treated with Solu-Medrol, Xopenex.  Since that time, states he has been doing well, has followed up with his pulmonologist Dr. Vassie Loll as an outpatient as well.  Starting yesterday, he started having a little bit more shortness of breath, no palpitations, no chest pain, denies any fever but did feel kind of hot this morning.  When he first woke up, checked his O2 saturation on room air he was about 84%.  He has been coughing up some sputum which is normal for him, looks a little bit green.  Workup in the emergency department as below raises concern for possible exacerbation of COPD, he was given IV Solu-Medrol, Xopenex, as well as empiric IV antibiotics.  Hospitalist was contacted for admission.  Review of Systems: Please see HPI for pertinent positives and negatives. A complete 10 system review of systems are otherwise negative.  Past Medical History:  Diagnosis Date   Anxiety    Aortic atherosclerosis (HCC)    Atrial fibrillation (HCC)    Basal cell carcinoma    Bladder cancer [facility])    Bladder tumor    Chronic systolic heart failure (HCC)    Colon polyps    COPD (chronic obstructive pulmonary disease) (HCC)    Cutaneous vasculitis    Diabetic peripheral neuropathy (HCC) 12/27/2020   Diverticulosis    Essential hypertension    GERD (gastroesophageal reflux disease)    History of pneumonia    Nephrolithiasis    Osteoarthritis    Right foot drop     Stroke [facility])    Past Surgical History:  Procedure Laterality Date   BRONCHIAL WASHINGS  06/19/2023   Procedure: BRONCHIAL WASHINGS;  Surgeon: Steffanie Dunn, DO;  Location: [facility] ENDOSCOPY;  Service: Cardiopulmonary;;   COLONOSCOPY     Kidney stones removed  1996   SKIN CANCER EXCISION Left    2003   TRANSURETHRAL RESECTION OF BLADDER TUMOR  1996   VIDEO BRONCHOSCOPY N/A 06/19/2023   Procedure: VIDEO BRONCHOSCOPY WITHOUT FLUORO;  Surgeon: Steffanie Dunn, DO;  Location: [facility] ENDOSCOPY;  Service: Cardiopulmonary;  Laterality: N/A;   Social History:  reports that he quit smoking about 12 years ago. His smoking use included cigarettes. He started smoking about 42 years ago. He has a 30 pack-year smoking history. He has never used smokeless tobacco. He reports that he does not drink alcohol and does not use drugs.  Allergies  Allergen Reactions   Simvastatin Rash   Cortisone Nausea And Vomiting   Albuterol Palpitations and Other (See Comments)    Pt is okay to take xopenex, albuterol raises his heart rate   Augmentin [Amoxicillin-Pot Clavulanate] Rash   Ciprofloxacin Rash   Contrast Media [Iodinated Contrast Media] Rash   Flagyl [Metronidazole] Rash   Latex Hives and Rash   Sulfa Antibiotics Rash    Family History  Problem Relation Age of Onset   Diabetes Mother    CAD Mother    Emphysema Father    Diabetes Sister    Stroke Sister    Lung  cancer Brother    Liver cancer Brother    Colon cancer Neg Hx      Prior to Admission medications   Medication Sig Start Date End Date Taking? Authorizing Provider  Acetaminophen (TYLENOL) 325 MG CAPS Take 1-2 capsules by mouth every 6 (six) hours as needed (pain).   Yes [provider]  ALPRAZolam (XANAX) 0.25 MG tablet Take 0.25 mg by mouth 2 (two) times daily as needed for anxiety or sleep.  07/01/14  Yes [provider]  apixaban (ELIQUIS) 5 MG TABS tablet Take 1 tablet (5 mg total) by mouth 2 (two) times daily. 04/17/23  Yes  Zigmund Daniel., MD  budesonide (PULMICORT) 0.25 MG/2ML nebulizer solution Take 2 mLs (0.25 mg total) by nebulization 2 (two) times daily. Patient taking differently: Take 0.25 mg by nebulization 2 (two) times daily. Mix with Sodium Chloride Nebulizer Solution. 06/22/23  Yes Dorcas Carrow, MD  Cyanocobalamin (VITAMIN B12 PO) Take 1 tablet by mouth daily.   Yes [provider]  ELDERBERRY PO Take 1 tablet by mouth daily.   Yes [provider]  esomeprazole (NEXIUM) 20 MG capsule Take 20 mg by mouth daily at 12 noon.   Yes [provider]  feeding supplement (ENSURE ENLIVE / ENSURE PLUS) LIQD Take 237 mLs by mouth 2 (two) times daily between meals. 07/15/23  Yes [name], [name], MD  guaiFENesin (MUCINEX) 600 MG 12 hr tablet Take 1 tablet (600 mg total) by mouth 2 (two) times daily. 08/08/23  Yes Oretha Milch, MD  levalbuterol (XOPENEX) 0.63 MG/3ML nebulizer solution Take 3 mLs (0.63 mg total) by nebulization every 6 (six) hours as needed for wheezing or shortness of breath. 07/17/23  Yes [name], Tammy S, NP  metoprolol succinate (TOPROL-XL) 25 MG 24 hr tablet TAKE 1 TABLET BY MOUTH TWICE A DAY 06/01/23  Yes Sharlene Dory, NP  sodium chloride HYPERTONIC 3 % nebulizer solution Take 4 mLs by nebulization 2 (two) times daily. Patient taking differently: Take 4 mLs by nebulization 2 (two) times daily. Mix with Pulmicort Nebulizer Solution 06/22/23  Yes Dorcas Carrow, MD  triamcinolone (NASACORT) 55 MCG/ACT AERO nasal inhaler Place 1 spray into the nose daily.   Yes [provider]  Artificial Tear Solution (BION TEARS OP) Apply 1-2 drops to eye daily as needed (red eyes, dry eyes). Patient not taking: Reported on 08/12/2023    [provider]  benzonatate (TESSALON) 100 MG capsule Take 2 capsules (200 mg total) by mouth 3 (three) times daily as needed. Patient not taking: Reported on 08/12/2023 06/22/23   Dorcas Carrow, MD  levalbuterol [facility] HFA) 45  MCG/ACT inhaler Inhale 2 puffs into the lungs every 4 (four) hours as needed for wheezing. Patient not taking: Reported on 08/12/2023 07/17/23   [name], Virgel Bouquet, NP    Physical Exam: BP 127/62   Pulse (!) 106   Temp 98.2 F (36.8 C) (Oral)   SpO2 95%  General:  Alert, oriented, calm, in no acute distress, currently wearing 2 L nasal cannula oxygen.  No respiratory distress, speaking full sentences, no cough. Cardiovascular: RRR, no murmurs or rubs, no peripheral edema  Respiratory: Breath sounds are distant, no active wheezing, some diffuse rhonchi.  No retractions, tachypnea or evidence of respiratory distress Abdomen: soft, nontender, nondistended, normal bowel tones heard  Skin: dry, no rashes  Musculoskeletal: no joint effusions, normal range of motion  Psychiatric: appropriate affect, normal speech  Neurologic: extraocular muscles intact, clear speech, moving all extremities with intact  sensorium         Labs on Admission:  Basic Metabolic Panel: Recent Labs  Lab 08/11/23 0120 08/12/23 1138  NA 140 139  K 3.0* 3.1*  CL 103 102  CO2 27 25  GLUCOSE 113* 150*  BUN 11 18  CREATININE 0.84 0.72  CALCIUM 8.9 9.0   Liver Function Tests: Recent Labs  Lab 08/12/23 1138  AST 21  ALT 16  ALKPHOS 58  BILITOT 0.7  PROT 7.0  ALBUMIN 3.2*   No results for input(s): "LIPASE", "AMYLASE" in the last 168 hours. No results for input(s): "AMMONIA" in the last 168 hours. CBC: Recent Labs  Lab 08/11/23 0120 08/12/23 1138  WBC 7.3 15.5*  NEUTROABS 5.1 13.6*  HGB 11.6* 11.9*  HCT 37.1* 38.1*  MCV 97.4 96.5  PLT 384 450*   Cardiac Enzymes: No results for input(s): "CKTOTAL", "CKMB", "CKMBINDEX", "TROPONINI" in the last 168 hours. BNP (last 3 results) Recent Labs    07/13/23 0533 08/11/23 0120 08/12/23 1138  BNP 117.8* 333.0* 222.1*    ProBNP (last 3 results) No results for input(s): "PROBNP" in the last 8760 hours.  CBG: No results for input(s): "GLUCAP" in the last  168 hours.  Radiological Exams on Admission: DG Chest 2 View Result Date: 08/12/2023 CLINICAL DATA:  Shortness of breath and weakness.  Cough. EXAM: CHEST - 2 VIEW COMPARISON:  08/11/2023 and 06/17/2023. FINDINGS: Heart is enlarged, stable. Biapical pleuroparenchymal scarring, right greater than left. Scattered micronodularity and streaky densities, right greater than left, corresponding to CT chest 06/17/2023 no definite superimposed airspace consolidation no pleural fluid. Osteopenia. Degenerative changes in the spine. IMPRESSION: 1. No definite acute findings. 2. Chronic mycobacterium avium complex. Electronically Signed   By: Leanna Battles M.D.   On: 08/12/2023 12:54   DG Chest Port 1 View Result Date: 08/11/2023 CLINICAL DATA:  Dyspnea EXAM: PORTABLE CHEST 1 VIEW COMPARISON:  07/12/2023 FINDINGS: The lungs are stably mildly hyperinflated in keeping with changes of underlying COPD. Stable asymmetric right apical pleural thickening. No focal pulmonary infiltrate. No pneumothorax or pleural effusion. Cardiac size within normal limits. Pulmonary vascularity is normal. No acute bone abnormality. IMPRESSION: 1. No active disease. COPD. Electronically Signed   By: Helyn Numbers M.D.   On: 08/11/2023 01:51   Assessment/Plan Matthew Hall is a 78 y.o. male with medical history significant for fibrillation on anticoagulation, chronic heart failure with reduced EF, GERD, hypertension, chronic bronchiectasis being admitted to the hospital with acute hypoxic respiratory failure and concern for acute exacerbation of COPD.  Acute exacerbation of COPD-with increased dyspnea, hypoxia, cough productive of green sputum.  Patient is feeling better after breathing treatments, steroids.  Also received empiric IV antibiotics. -Observation admission -Supplemental oxygen as needed -Continue IV Solu-Medrol 40 mg IV every 12 hours -Scheduled Atrovent and Xopenex nebulizers -Incentive spirometer and flutter  valve -Empiric IV azithromycin and IV Rocephin -Mucinex twice daily  Heart failure with reduced EF-currently no evidence of acute exacerbation  Anxiety-Xanax as needed  Type 2 diabetes-on metformin at home -Hold metformin while admitted -Carb modified diet -Sliding scale insulin  Elevated troponin-I suspect this is troponin leak due to tachycardia, as the patient was noted to have heart rate greater than 140 on arrival, he also noticed that his heart rate was about 150 when he was at home this morning.  Denies any chest pain.  No acute EKG changes.  Atrial fibrillation-currently in rate controlled A-fib -Continue Toprol-XL 25 mg p.o. twice daily -Continue Eliquis anticoagulation  GERD-Protonix p.o.  DVT prophylaxis: Eliquis as above  Full code  Consults called: None  Admission status: Observation  Time spent: 49 minutes  Davanna He Sharlette Dense MD [facility] Pager [phone]  If 7PM-7AM, please contact night-coverage [email or web address] Password [facility]  08/12/2023, 4:39 PM

## 2023-08-13 DIAGNOSIS — Z87891 Personal history of nicotine dependence: Secondary | ICD-10-CM | POA: Diagnosis not present

## 2023-08-13 DIAGNOSIS — I110 Hypertensive heart disease with heart failure: Secondary | ICD-10-CM | POA: Diagnosis present

## 2023-08-13 DIAGNOSIS — Z7901 Long term (current) use of anticoagulants: Secondary | ICD-10-CM | POA: Diagnosis not present

## 2023-08-13 DIAGNOSIS — Z800 Family history of malignant neoplasm of digestive organs: Secondary | ICD-10-CM | POA: Diagnosis not present

## 2023-08-13 DIAGNOSIS — Z7984 Long term (current) use of oral hypoglycemic drugs: Secondary | ICD-10-CM | POA: Diagnosis not present

## 2023-08-13 DIAGNOSIS — I5022 Chronic systolic (congestive) heart failure: Secondary | ICD-10-CM | POA: Diagnosis present

## 2023-08-13 DIAGNOSIS — Z8249 Family history of ischemic heart disease and other diseases of the circulatory system: Secondary | ICD-10-CM | POA: Diagnosis not present

## 2023-08-13 DIAGNOSIS — I480 Paroxysmal atrial fibrillation: Secondary | ICD-10-CM | POA: Diagnosis present

## 2023-08-13 DIAGNOSIS — E1142 Type 2 diabetes mellitus with diabetic polyneuropathy: Secondary | ICD-10-CM | POA: Diagnosis present

## 2023-08-13 DIAGNOSIS — J9601 Acute respiratory failure with hypoxia: Secondary | ICD-10-CM | POA: Diagnosis present

## 2023-08-13 DIAGNOSIS — J479 Bronchiectasis, uncomplicated: Secondary | ICD-10-CM | POA: Diagnosis present

## 2023-08-13 DIAGNOSIS — Z85828 Personal history of other malignant neoplasm of skin: Secondary | ICD-10-CM | POA: Diagnosis not present

## 2023-08-13 DIAGNOSIS — K219 Gastro-esophageal reflux disease without esophagitis: Secondary | ICD-10-CM | POA: Diagnosis present

## 2023-08-13 DIAGNOSIS — Z79899 Other long term (current) drug therapy: Secondary | ICD-10-CM | POA: Diagnosis not present

## 2023-08-13 DIAGNOSIS — J441 Chronic obstructive pulmonary disease with (acute) exacerbation: Secondary | ICD-10-CM | POA: Diagnosis present

## 2023-08-13 DIAGNOSIS — A310 Pulmonary mycobacterial infection: Secondary | ICD-10-CM | POA: Diagnosis present

## 2023-08-13 DIAGNOSIS — I2489 Other forms of acute ischemic heart disease: Secondary | ICD-10-CM | POA: Diagnosis present

## 2023-08-13 DIAGNOSIS — F419 Anxiety disorder, unspecified: Secondary | ICD-10-CM | POA: Diagnosis present

## 2023-08-13 DIAGNOSIS — Z1152 Encounter for screening for COVID-19: Secondary | ICD-10-CM | POA: Diagnosis not present

## 2023-08-13 DIAGNOSIS — Z801 Family history of malignant neoplasm of trachea, bronchus and lung: Secondary | ICD-10-CM | POA: Diagnosis not present

## 2023-08-13 DIAGNOSIS — Z5982 Transportation insecurity: Secondary | ICD-10-CM | POA: Diagnosis not present

## 2023-08-13 DIAGNOSIS — Z7951 Long term (current) use of inhaled steroids: Secondary | ICD-10-CM | POA: Diagnosis not present

## 2023-08-13 DIAGNOSIS — Z825 Family history of asthma and other chronic lower respiratory diseases: Secondary | ICD-10-CM | POA: Diagnosis not present

## 2023-08-13 DIAGNOSIS — Z823 Family history of stroke: Secondary | ICD-10-CM | POA: Diagnosis not present

## 2023-08-13 LAB — BASIC METABOLIC PANEL
Anion gap: 7 (ref 5–15)
BUN: 21 mg/dL (ref 8–23)
CO2: 26 mmol/L (ref 22–32)
Calcium: 9.1 mg/dL (ref 8.9–10.3)
Chloride: 106 mmol/L (ref 98–111)
Creatinine, Ser: 0.82 mg/dL (ref 0.61–1.24)
GFR, Estimated: >60 mL/min (ref >60)
Glucose, Bld: 127 mg/dL — ABNORMAL HIGH (ref 70–99)
Potassium: 4.8 mmol/L (ref 3.5–5.1)
Sodium: 139 mmol/L (ref 135–145)

## 2023-08-13 LAB — GLUCOSE, CAPILLARY
Glucose-Capillary: 114 mg/dL — ABNORMAL HIGH (ref 70–99)
Glucose-Capillary: 182 mg/dL — ABNORMAL HIGH (ref 70–99)
Glucose-Capillary: 189 mg/dL — ABNORMAL HIGH (ref 70–99)
Glucose-Capillary: 132 mg/dL — ABNORMAL HIGH (ref 70–99)

## 2023-08-13 LAB — CBC
HCT: 36.7 % — ABNORMAL LOW (ref 39.0–52.0)
Hemoglobin: 11.4 g/dL — ABNORMAL LOW (ref 13.0–17.0)
MCH: 30.6 pg (ref 26.0–34.0)
MCHC: 31.1 g/dL (ref 30.0–36.0)
MCV: 98.4 fL (ref 80.0–100.0)
Platelets: 396 10*3/uL (ref 150–400)
RBC: 3.73 MIL/uL — ABNORMAL LOW (ref 4.22–5.81)
RDW: 13.7 % (ref 11.5–15.5)
WBC: 16.5 10*3/uL — ABNORMAL HIGH (ref 4.0–10.5)
nRBC: 0 % (ref 0.0–0.2)

## 2023-08-13 LAB — TROPONIN I (HIGH SENSITIVITY): Troponin I (High Sensitivity): 117 ng/L (ref <18)

## 2023-08-13 LAB — MRSA NEXT GEN BY PCR, NASAL: MRSA by PCR Next Gen: NOT DETECTED

## 2023-08-13 MED ORDER — BUDESONIDE 0.25 MG/2ML IN SUSP
0.2500 mg | Freq: Two times a day (BID) | RESPIRATORY_TRACT | Status: DC
  Administered 2023-08-13 – 2023-08-17 (×9): 0.25 mg via RESPIRATORY_TRACT
  Filled 2023-08-13 (×9): qty 2

## 2023-08-13 MED ORDER — ORAL CARE MOUTH RINSE: 15.0000 mL | OROMUCOSAL | Status: DC | PRN

## 2023-08-13 MED ORDER — ARFORMOTEROL TARTRATE 15 MCG/2ML IN NEBU
15.0000 ug | INHALATION_SOLUTION | Freq: Two times a day (BID) | RESPIRATORY_TRACT | Status: DC
  Administered 2023-08-13 – 2023-08-16 (×8): 15 ug via RESPIRATORY_TRACT
  Filled 2023-08-13 (×9): qty 2

## 2023-08-13 MED ORDER — FUROSEMIDE 10 MG/ML IJ SOLN
40.0000 mg | Freq: Once | INTRAMUSCULAR | Status: AC
  Administered 2023-08-13: 40 mg via INTRAVENOUS
  Filled 2023-08-13: qty 4

## 2023-08-13 MED ORDER — SALINE SPRAY 0.65 % NA SOLN
1.0000 | NASAL | Status: DC | PRN
  Filled 2023-08-13: qty 44

## 2023-08-13 NOTE — Care Management Obs Status (Signed)
MEDICARE OBSERVATION STATUS NOTIFICATION   Patient Details  Name: Matthew Hall MRN: [id number] Date of Birth: 11-11-1945   Medicare Observation Status Notification Given:  Yes    MahabirOlegario Messier, RN 08/13/2023, 11:07 AM

## 2023-08-13 NOTE — TOC Initial Note (Signed)
Transition of Care [facility]) - Initial/Assessment Note    Patient Details  Name: Matthew Hall MRN: [id number] Date of Birth: 1945/08/27  [facility] CM/SW Contact:    Lanier Clam, RN Phone Number: 08/13/2023, 11:21 AM  Clinical Narrative: From home. On 02 monitor.                        Patient Goals and CMS Choice Patient states their goals for this hospitalization and ongoing recovery are:: Home CMS Medicare.gov Compare Post Acute Care list provided to:: Patient Choice offered to / list presented to : Patient      Expected Discharge Plan and Services                                              Prior Living Arrangements/Services                       Activities of Daily Living   ADL Screening (condition at time of admission) Independently performs ADLs?: Yes (appropriate for developmental age) Is the patient deaf or have difficulty hearing?: No Does the patient have difficulty seeing, even when wearing glasses/contacts?: No Does the patient have difficulty concentrating, remembering, or making decisions?: No  Permission Sought/Granted                  Emotional Assessment              Admission diagnosis:  COPD exacerbation (HCC) [J44.1] COPD with acute exacerbation (HCC) [J44.1] Atrial fibrillation, unspecified type (HCC) [I48.91] Patient Active Problem List   Diagnosis Date Noted   Prolonged QT interval 07/13/2023   DM2 (diabetes mellitus, type 2) (HCC) 07/13/2023   Paroxysmal atrial fibrillation (HCC) 07/13/2023   GAD (generalized anxiety disorder) 07/13/2023   Chronic systolic CHF (congestive heart failure) (HCC) 07/13/2023   SOB (shortness of breath) 07/13/2023   Malnutrition of moderate degree 07/13/2023   Acute respiratory failure with hypoxia (HCC) 07/12/2023   Pain due to onychomycosis of toenails of both feet 07/02/2023   Paronychia of great toe of left foot 07/02/2023   Bronchiectasis with (acute)  exacerbation (HCC) 06/20/2023   Bronchiectasis with acute exacerbation (HCC) 06/19/2023   Pneumonia due to infectious organism 06/19/2023   COPD with acute exacerbation (HCC) 06/18/2023   Multifocal pneumonia 06/17/2023   Sepsis (HCC) 06/17/2023   Physical deconditioning 06/17/2023   Weakness of right foot 05/09/2023   Cerebrovascular accident (CVA) (HCC) 04/12/2023   Anxiety 04/12/2023   Diabetic peripheral neuropathy (HCC) 12/27/2020   Paresthesia 12/01/2020   Pneumonia due to COVID-19 virus 10/29/2020   Hypokalemia 10/29/2020   Healthcare maintenance 07/25/2019   Leukocytosis 08/11/2017   Elevated sed rate 05/21/2017   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) 03/01/2017   Vasculitis (HCC) 02/20/2017   Atrial fibrillation, chronic (HCC) 07/09/2016   BRONCHIECTASIS W/O ACUTE EXACERBATION 03/31/2008   Essential hypertension 09/13/2007   Allergic rhinitis 09/13/2007   EMPHYSEMA 09/13/2007   COPD exacerbation (HCC) 09/13/2007   PCP:  Alinda Deem, MD Pharmacy:   [phone] - [place] [place] - [address] AT [address] OF [facility] [address] [place] [address] Phone: [phone] Fax: [phone]  [facility] - [place], [place] - [address] [place] [address] Phone: [phone] Fax: [phone]  [place] [facility] [address] [place] [address] Phone: [phone] Fax: [phone]     Social Drivers of Health (SDOH) Social History: SDOH Screenings   Food Insecurity: No Food Insecurity (08/12/2023)  Recent Concern: Food Insecurity - Food Insecurity Present (06/18/2023)  Housing: Low Risk  (08/12/2023)  Transportation Needs: No Transportation Needs (08/12/2023)  Recent Concern: Transportation Needs - Unmet Transportation Needs (07/13/2023)  Utilities: Not At Risk (08/12/2023)  Depression (PHQ2-9): Low Risk  (05/04/2019)  Social Connections: Moderately Isolated (08/12/2023)   Tobacco Use: Medium Risk (08/12/2023)   SDOH Interventions:     Readmission Risk Interventions    07/15/2023    2:01 PM 06/22/2023   11:49 AM  Readmission Risk Prevention Plan  Transportation Screening Complete Complete  Home Care Screening  Complete  Medication Review (RN CM)  Complete  Medication Review (RN Care Manager) Complete   PCP or Specialist appointment within 3-5 days of discharge Complete   HRI or Home Care Consult Complete   SW Recovery Care/Counseling Consult Complete   Palliative Care Screening Not Applicable   Skilled Nursing Facility Not Applicable

## 2023-08-13 NOTE — Progress Notes (Signed)
PROGRESS NOTE  Matthew PALLONE ZOX:[id number] DOB: [birth year] DOA: 08/12/2023 PCP: Alinda Deem, MD   LOS: 0 days   Brief Narrative / Interim history: 78 year old male with history of PAF on anticoagulation, chronic systolic CHF, HTN, chronic bronchiectasis who comes into the hospital with acute hypoxic respiratory failure, concern for COPD exacerbation.  He was recently admitted for the same issue, treated conservatively with steroids, nebulizers and improved.  He has been doing well, saw Dr. Vassie Loll as an outpatient just a few days ago.  Starting the day prior to admission he was having more shortness of breath, as well as increased chills and muscle aches and weakness.  He checked his sats and was 84% on room air and came to the hospital.  He was placed on steroids, nebulizers, antibiotics and was admitted  Subjective / 24h Interval events: Still feeling short of breath this morning and wheezy.  Has not been out of bed  Assesement and Plan: Principal problem Acute hypoxic respiratory failure due to COPD exacerbation -with increased dyspnea, hypoxia, green sputum production.  Continue steroids, breathing treatments, add Pulmicort as well as Brovana today -Continue supplemental oxygen, wean off to room air as tolerated -Initial flu testing was negative, then repeat testing was equivocal for influenza A, per infectious department this will be checked further regarding the subtype and will be placed on airborne precautions meanwhile  Active problems Chronic systolic CHF-most recent 2D echo in the system in October 2024 shows LVEF 40 to 45%.  Continue metoprolol.  BNP slightly elevated on admission at 222, which is close to his prior values. -Will administer Lasix x 1 to keep on the dry side  COPD, bronchiectasis, prior MAC -seen by pulmonology as an outpatient.  Treating exacerbation as above  Anxiety - Xanax as needed  Elevated troponin -likely demand ischemia due to acute illness.  No  chest pains.  PAF -currently in rate controlled A-fib, continue metoprolol and Eliquis anticoagulation  DM2-hold metformin, continue sliding scale  Lab Results  Component Value Date   HGBA1C 6.9 (H) 05/09/2023   CBG (last 3)  Recent Labs    08/12/23 1716 08/12/23 2245 08/13/23 0744  GLUCAP 159* 168* 114*    Scheduled Meds:  apixaban  5 mg Oral BID   arformoterol  15 mcg Nebulization BID   budesonide (PULMICORT) nebulizer solution  0.25 mg Nebulization BID   feeding supplement  237 mL Oral BID BM   guaiFENesin  600 mg Oral BID   insulin aspart  0-15 Units Subcutaneous TID WC   insulin aspart  0-5 Units Subcutaneous QHS   ipratropium  0.5 mg Nebulization Q6H   levalbuterol  0.63 mg Nebulization Q6H   methylPREDNISolone (SOLU-MEDROL) injection  40 mg Intravenous Q12H   metoprolol succinate  25 mg Oral BID   pantoprazole  40 mg Oral Daily   triamcinolone  1 spray Each Nare Daily   Continuous Infusions:  azithromycin     cefTRIAXone (ROCEPHIN)  IV     PRN Meds:.acetaminophen **OR** acetaminophen, ALPRAZolam, mouth rinse  Current Outpatient Medications  Medication Instructions   Acetaminophen (TYLENOL) 325 MG CAPS 1-2 capsules, Every 6 hours PRN   ALPRAZolam (XANAX) 0.25 mg, 2 times daily PRN   apixaban (ELIQUIS) 5 mg, Oral, 2 times daily   Artificial Tear Solution (BION TEARS OP) 1-2 drops, Daily PRN   benzonatate (TESSALON) 200 mg, Oral, 3 times daily PRN   budesonide (PULMICORT) 0.25 mg, Nebulization, 2 times daily   Cyanocobalamin (VITAMIN B12 PO) 1  tablet, Daily   ELDERBERRY PO 1 tablet, Daily   esomeprazole (NEXIUM) 20 mg, Daily   feeding supplement (ENSURE ENLIVE / ENSURE PLUS) LIQD 237 mLs, Oral, 2 times daily between meals   guaiFENesin (MUCINEX) 600 mg, Oral, 2 times daily   levalbuterol (XOPENEX HFA) 45 MCG/ACT inhaler 2 puffs, Inhalation, Every 4 hours PRN   levalbuterol (XOPENEX) 0.63 mg, Nebulization, Every 6 hours PRN   metoprolol succinate (TOPROL-XL)  25 mg, Oral, 2 times daily   sodium chloride HYPERTONIC 3 % nebulizer solution 4 mLs, Nebulization, 2 times daily   triamcinolone (NASACORT) 55 MCG/ACT AERO nasal inhaler 1 spray, Daily    Diet Orders (From admission, onward)     Start     Ordered   08/12/23 1642  Diet Carb Modified Fluid consistency: Thin; Room service appropriate? Yes  Diet effective now       Question Answer Comment  Diet-HS Snack? Nothing   Calorie Level Medium 1600-2000   Fluid consistency: Thin   Room service appropriate? Yes      08/12/23 1641            DVT prophylaxis: SCDs Start: 08/12/23 1639 apixaban (ELIQUIS) tablet 5 mg   Lab Results  Component Value Date   PLT 396 08/13/2023      Code Status: Full Code  Family Communication: no family at bedside   Status is: Observation The patient will require care spanning > 2 midnights and should be moved to inpatient because: Persistent hypoxia  Level of care: Telemetry  Consultants:  None  Objective: Vitals:   08/13/23 0842 08/13/23 0916 08/13/23 0926 08/13/23 0947  BP:  112/60  112/72  Pulse:  83  97  Resp:   15 20  Temp:   97.7 F (36.5 C) (!) 97.5 F (36.4 C)  TempSrc:   Oral Oral  SpO2: 92%  95% 97%  Weight:      Height:        Intake/Output Summary (Last 24 hours) at 08/13/2023 1030 Last data filed at 08/13/2023 1000 Gross per 24 hour  Intake 1200 ml  Output 550 ml  Net 650 ml   Wt Readings from Last 3 Encounters:  08/12/23 67.7 kg  08/11/23 67.6 kg  08/08/23 67.6 kg    Examination:  Constitutional: NAD Eyes: no scleral icterus ENMT: Mucous membranes are moist.  Neck: normal, supple Respiratory: Diffuse end expiratory wheezing, no crackles Cardiovascular: Regular rate and rhythm, no murmurs / rubs / gallops. No LE edema.  Abdomen: non distended, no tenderness. Bowel sounds positive.  Musculoskeletal: no clubbing / cyanosis.    Data Reviewed: I have independently reviewed following labs and imaging studies    CBC Recent Labs  Lab 08/11/23 0120 08/12/23 1138 08/13/23 0503  WBC 7.3 15.5* 16.5*  HGB 11.6* 11.9* 11.4*  HCT 37.1* 38.1* 36.7*  PLT 384 450* 396  MCV 97.4 96.5 98.4  MCH 30.4 30.1 30.6  MCHC 31.3 31.2 31.1  RDW 13.6 13.5 13.7  LYMPHSABS 0.8 0.8  --   MONOABS 1.1* 0.9  --   EOSABS 0.2 0.0  --   BASOSABS 0.0 0.0  --     Recent Labs  Lab 08/11/23 0120 08/12/23 1138 08/12/23 1217 08/12/23 1419 08/12/23 1427 08/13/23 0503  NA 140 139  --   --   --  139  K 3.0* 3.1*  --   --   --  4.8  CL 103 102  --   --   --  106  CO2 27 25  --   --   --  26  GLUCOSE 113* 150*  --   --   --  127*  BUN 11 18  --   --   --  21  CREATININE 0.84 0.72  --   --   --  0.82  CALCIUM 8.9 9.0  --   --   --  9.1  AST  --  21  --   --   --   --   ALT  --  16  --   --   --   --   ALKPHOS  --  58  --   --   --   --   BILITOT  --  0.7  --   --   --   --   ALBUMIN  --  3.2*  --   --   --   --   DDIMER  --   --   --   --  <0.27  --   LATICACIDVEN  --   --  2.3* 2.5*  --   --   BNP 333.0* 222.1*  --   --   --   --     ------------------------------------------------------------------------------------------------------------------ No results for input(s): "CHOL", "HDL", "LDLCALC", "TRIG", "CHOLHDL", "LDLDIRECT" in the last 72 hours.  Lab Results  Component Value Date   HGBA1C 6.9 (H) 05/09/2023   ------------------------------------------------------------------------------------------------------------------ No results for input(s): "TSH", "T4TOTAL", "T3FREE", "THYROIDAB" in the last 72 hours.  Invalid input(s): "FREET3"  Cardiac Enzymes No results for input(s): "CKMB", "TROPONINI", "MYOGLOBIN" in the last 168 hours.  Invalid input(s): "CK" ------------------------------------------------------------------------------------------------------------------    Component Value Date/Time   BNP 222.1 (H) 08/12/2023 1138    CBG: Recent Labs  Lab 08/12/23 1716 08/12/23 2245  08/13/23 0744  GLUCAP 159* 168* 114*    Recent Results (from the past 240 hours)  Resp panel by RT-PCR (RSV, Flu A&B, Covid) Anterior Nasal Swab     Status: None   Collection Time: 08/11/23  1:28 AM   Specimen: Anterior Nasal Swab  Result Value Ref Range Status   SARS Coronavirus 2 by RT PCR NEGATIVE NEGATIVE Final    Comment: (NOTE) SARS-CoV-2 target nucleic acids are NOT DETECTED.  The SARS-CoV-2 RNA is generally detectable in upper respiratory specimens during the acute phase of infection. The lowest concentration of SARS-CoV-2 viral copies this assay can detect is 138 copies/mL. A negative result does not preclude SARS-Cov-2 infection and should not be used as the sole basis for treatment or other patient management decisions. A negative result may occur with  improper specimen collection/handling, submission of specimen other than nasopharyngeal swab, presence of viral mutation(s) within the areas targeted by this assay, and inadequate number of viral copies(<138 copies/mL). A negative result must be combined with clinical observations, patient history, and epidemiological information. The expected result is Negative.  Fact Sheet for Patients:  BloggerCourse.com  Fact Sheet for Healthcare Providers:  SeriousBroker.it  This test is no t yet approved or cleared by the [place] FDA and  has been authorized for detection and/or diagnosis of SARS-CoV-2 by FDA under an Emergency Use Authorization (EUA). This EUA will remain  in effect (meaning this test can be used) for the duration of the COVID-19 declaration under Section 564(b)(1) of the Act, 21 U.S.C.section 360bbb-3(b)(1), unless the authorization is terminated  or revoked sooner.       Influenza A by PCR NEGATIVE NEGATIVE Final   Influenza B by PCR  NEGATIVE NEGATIVE Final    Comment: (NOTE) The Xpert Xpress SARS-CoV-2/FLU/RSV plus assay is intended as an aid in  the diagnosis of influenza from Nasopharyngeal swab specimens and should not be used as a sole basis for treatment. Nasal washings and aspirates are unacceptable for Xpert Xpress SARS-CoV-2/FLU/RSV testing.  Fact Sheet for Patients: BloggerCourse.com  Fact Sheet for Healthcare Providers: SeriousBroker.it  This test is not yet approved or cleared by the [place] FDA and has been authorized for detection and/or diagnosis of SARS-CoV-2 by FDA under an Emergency Use Authorization (EUA). This EUA will remain in effect (meaning this test can be used) for the duration of the COVID-19 declaration under Section 564(b)(1) of the Act, 21 U.S.C. section 360bbb-3(b)(1), unless the authorization is terminated or revoked.     Resp Syncytial Virus by PCR NEGATIVE NEGATIVE Final    Comment: (NOTE) Fact Sheet for Patients: BloggerCourse.com  Fact Sheet for Healthcare Providers: SeriousBroker.it  This test is not yet approved or cleared by the [place] FDA and has been authorized for detection and/or diagnosis of SARS-CoV-2 by FDA under an Emergency Use Authorization (EUA). This EUA will remain in effect (meaning this test can be used) for the duration of the COVID-19 declaration under Section 564(b)(1) of the Act, 21 U.S.C. section 360bbb-3(b)(1), unless the authorization is terminated or revoked.  Performed at [facility], [address], [place], [place] [address]   Culture, blood (Routine x 2)     Status: None (Preliminary result)   Collection Time: 08/12/23 11:38 AM   Specimen: BLOOD RIGHT ARM  Result Value Ref Range Status   Specimen Description   Final    BLOOD RIGHT ARM Performed at [facility], [address], [place], [place] [address]    Special Requests   Final    BOTTLES DRAWN AEROBIC AND ANAEROBIC Blood Culture adequate volume Performed at  [facility], [address] [place], [place] [address]    Culture   Final    NO GROWTH < 24 HOURS Performed at [facility], [address] [place], [place] [address]    Report Status PENDING  Incomplete  Culture, blood (Routine x 2)     Status: None (Preliminary result)   Collection Time: 08/12/23 12:05 PM   Specimen: BLOOD LEFT ARM  Result Value Ref Range Status   Specimen Description   Final    BLOOD LEFT ARM Performed at [facility], [address], [place], [place] [address]    Special Requests   Final    BOTTLES DRAWN AEROBIC AND ANAEROBIC Blood Culture adequate volume Performed at [facility], [address], [place], [place] [address]    Culture   Final    NO GROWTH < 24 HOURS Performed at [facility], [address] [place], [place] [address]    Report Status PENDING  Incomplete  Resp panel by RT-PCR (RSV, Flu A&B, Covid) Anterior Nasal Swab     Status: None   Collection Time: 08/12/23 12:54 PM   Specimen: Anterior Nasal Swab  Result Value Ref Range Status   SARS Coronavirus 2 by RT PCR NEGATIVE NEGATIVE Final    Comment: (NOTE) SARS-CoV-2 target nucleic acids are NOT DETECTED.  The SARS-CoV-2 RNA is generally detectable in upper respiratory specimens during the acute phase of infection. The lowest concentration of SARS-CoV-2 viral copies this assay can detect is 138 copies/mL. A negative result does not preclude SARS-Cov-2 infection  and should not be used as the sole basis for treatment or other patient management decisions. A negative result may occur with  improper specimen collection/handling, submission of specimen other than nasopharyngeal swab, presence of viral mutation(s) within the areas targeted by this assay, and inadequate number of viral copies(<138 copies/mL). A negative result must be combined with clinical observations, patient history, and epidemiological information. The expected  result is Negative.  Fact Sheet for Patients:  BloggerCourse.com  Fact Sheet for Healthcare Providers:  SeriousBroker.it  This test is no t yet approved or cleared by the [place] FDA and  has been authorized for detection and/or diagnosis of SARS-CoV-2 by FDA under an Emergency Use Authorization (EUA). This EUA will remain  in effect (meaning this test can be used) for the duration of the COVID-19 declaration under Section 564(b)(1) of the Act, 21 U.S.C.section 360bbb-3(b)(1), unless the authorization is terminated  or revoked sooner.       Influenza A by PCR NEGATIVE NEGATIVE Final   Influenza B by PCR NEGATIVE NEGATIVE Final    Comment: (NOTE) The Xpert Xpress SARS-CoV-2/FLU/RSV plus assay is intended as an aid in the diagnosis of influenza from Nasopharyngeal swab specimens and should not be used as a sole basis for treatment. Nasal washings and aspirates are unacceptable for Xpert Xpress SARS-CoV-2/FLU/RSV testing.  Fact Sheet for Patients: BloggerCourse.com  Fact Sheet for Healthcare Providers: SeriousBroker.it  This test is not yet approved or cleared by the [place] FDA and has been authorized for detection and/or diagnosis of SARS-CoV-2 by FDA under an Emergency Use Authorization (EUA). This EUA will remain in effect (meaning this test can be used) for the duration of the COVID-19 declaration under Section 564(b)(1) of the Act, 21 U.S.C. section 360bbb-3(b)(1), unless the authorization is terminated or revoked.     Resp Syncytial Virus by PCR NEGATIVE NEGATIVE Final    Comment: (NOTE) Fact Sheet for Patients: BloggerCourse.com  Fact Sheet for Healthcare Providers: SeriousBroker.it  This test is not yet approved or cleared by the [place] FDA and has been authorized for detection and/or diagnosis of  SARS-CoV-2 by FDA under an Emergency Use Authorization (EUA). This EUA will remain in effect (meaning this test can be used) for the duration of the COVID-19 declaration under Section 564(b)(1) of the Act, 21 U.S.C. section 360bbb-3(b)(1), unless the authorization is terminated or revoked.  Performed at [facility], [address] [place], [place] [address]   Respiratory (~20 pathogens) panel by PCR     Status: Abnormal   Collection Time: 08/12/23  3:37 PM   Specimen: Nasopharyngeal Swab; Respiratory  Result Value Ref Range Status   Adenovirus NOT DETECTED NOT DETECTED Final   Coronavirus 229E NOT DETECTED NOT DETECTED Final    Comment: (NOTE) The Coronavirus on the Respiratory Panel, DOES NOT test for the novel  Coronavirus (2019 nCoV)    Coronavirus HKU1 NOT DETECTED NOT DETECTED Final   Coronavirus NL63 NOT DETECTED NOT DETECTED Final   Coronavirus OC43 NOT DETECTED NOT DETECTED Final   Metapneumovirus NOT DETECTED NOT DETECTED Final   Rhinovirus / Enterovirus NOT DETECTED NOT DETECTED Final   Influenza A EQUIVOCAL (A) NOT DETECTED Final    Comment: Referred to [facility] in [place], [place] for serotyping.   Influenza B NOT DETECTED NOT DETECTED Final   Parainfluenza Virus 1 NOT DETECTED NOT DETECTED Final   Parainfluenza Virus 2 NOT DETECTED NOT DETECTED Final   Parainfluenza Virus 3 NOT DETECTED NOT DETECTED Final  Parainfluenza Virus 4 NOT DETECTED NOT DETECTED Final   Respiratory Syncytial Virus NOT DETECTED NOT DETECTED Final   Bordetella pertussis NOT DETECTED NOT DETECTED Final   Bordetella Parapertussis NOT DETECTED NOT DETECTED Final   Chlamydophila pneumoniae NOT DETECTED NOT DETECTED Final   Mycoplasma pneumoniae NOT DETECTED NOT DETECTED Final    Comment: Performed at [facility], [address] [place], [place] [address]  MRSA Next Gen by PCR, Nasal     Status: None   Collection Time: 08/13/23 12:13 AM   Specimen: Nasal  Mucosa; Nasal Swab  Result Value Ref Range Status   MRSA by PCR Next Gen NOT DETECTED NOT DETECTED Final    Comment: (NOTE) The GeneXpert MRSA Assay (FDA approved for NASAL specimens only), is one component of a comprehensive MRSA colonization surveillance program. It is not intended to diagnose MRSA infection nor to guide or monitor treatment for MRSA infections. Test performance is not FDA approved in patients less than 57 years old. Performed at [facility], [address], [place], [place] [address]      Radiology Studies: DG Chest 2 View Result Date: 08/12/2023 CLINICAL DATA:  Shortness of breath and weakness.  Cough. EXAM: CHEST - 2 VIEW COMPARISON:  08/11/2023 and 06/17/2023. FINDINGS: Heart is enlarged, stable. Biapical pleuroparenchymal scarring, right greater than left. Scattered micronodularity and streaky densities, right greater than left, corresponding to CT chest 06/17/2023 no definite superimposed airspace consolidation no pleural fluid. Osteopenia. Degenerative changes in the spine. IMPRESSION: 1. No definite acute findings. 2. Chronic mycobacterium avium complex. Electronically Signed   By: Leanna Battles M.D.   On: 08/12/2023 12:54     Pamella Pert, MD, PhD [facility]  Between 7 am - 7 pm I am available, please contact me via Amion (for emergencies) or Securechat (non urgent messages)  Between 7 pm - 7 am I am not available, please contact night coverage MD/APP via Amion

## 2023-08-13 NOTE — Progress Notes (Signed)
Report received from Leotis Shames, RN. Care assumed for pt at this time. Assessment unchanged from AM assessment. Pt oriented to callbell and environment. POC reviewed w/ pt. Pt denies pain/SOB/discomfort at this time. Resting in bed, drinking ensure. Denies any other needs at present. Droplet and contact isolation maintained and reviewed w/ pt.

## 2023-08-14 DIAGNOSIS — J441 Chronic obstructive pulmonary disease with (acute) exacerbation: Secondary | ICD-10-CM | POA: Diagnosis not present

## 2023-08-14 LAB — COMPREHENSIVE METABOLIC PANEL
ALT: 16 U/L (ref 0–44)
AST: 22 U/L (ref 15–41)
Albumin: 2.9 g/dL — ABNORMAL LOW (ref 3.5–5.0)
Alkaline Phosphatase: 44 U/L (ref 38–126)
Anion gap: 9 (ref 5–15)
BUN: 24 mg/dL — ABNORMAL HIGH (ref 8–23)
CO2: 30 mmol/L (ref 22–32)
Calcium: 8.8 mg/dL — ABNORMAL LOW (ref 8.9–10.3)
Chloride: 98 mmol/L (ref 98–111)
Creatinine, Ser: 0.8 mg/dL (ref 0.61–1.24)
GFR, Estimated: >60 mL/min (ref >60)
Glucose, Bld: 139 mg/dL — ABNORMAL HIGH (ref 70–99)
Potassium: 4 mmol/L (ref 3.5–5.1)
Sodium: 137 mmol/L (ref 135–145)
Total Bilirubin: 0.4 mg/dL (ref 0.0–1.2)
Total Protein: 6.5 g/dL (ref 6.5–8.1)

## 2023-08-14 LAB — CBC
HCT: 36 % — ABNORMAL LOW (ref 39.0–52.0)
Hemoglobin: 11.3 g/dL — ABNORMAL LOW (ref 13.0–17.0)
MCH: 30.8 pg (ref 26.0–34.0)
MCHC: 31.4 g/dL (ref 30.0–36.0)
MCV: 98.1 fL (ref 80.0–100.0)
Platelets: 374 10*3/uL (ref 150–400)
RBC: 3.67 MIL/uL — ABNORMAL LOW (ref 4.22–5.81)
RDW: 13.5 % (ref 11.5–15.5)
WBC: 17.6 10*3/uL — ABNORMAL HIGH (ref 4.0–10.5)
nRBC: 0 % (ref 0.0–0.2)

## 2023-08-14 LAB — BLOOD CULTURE ID PANEL (REFLEXED) - BCID2

## 2023-08-14 LAB — GLUCOSE, CAPILLARY
Glucose-Capillary: 114 mg/dL — ABNORMAL HIGH (ref 70–99)
Glucose-Capillary: 191 mg/dL — ABNORMAL HIGH (ref 70–99)
Glucose-Capillary: 162 mg/dL — ABNORMAL HIGH (ref 70–99)
Glucose-Capillary: 141 mg/dL — ABNORMAL HIGH (ref 70–99)

## 2023-08-14 LAB — MAGNESIUM: Magnesium: 1.9 mg/dL (ref 1.7–2.4)

## 2023-08-14 LAB — PHOSPHORUS: Phosphorus: 2.9 mg/dL (ref 2.5–4.6)

## 2023-08-14 MED ORDER — FUROSEMIDE 10 MG/ML IJ SOLN
40.0000 mg | Freq: Once | INTRAMUSCULAR | Status: AC
  Administered 2023-08-14: 40 mg via INTRAVENOUS
  Filled 2023-08-14: qty 4

## 2023-08-14 NOTE — Progress Notes (Signed)
 PROGRESS NOTE  Matthew Hall FMW:[id number] DOB: 1946-06-28 DOA: 08/12/2023 PCP: [name], MD   LOS: 1 day   Brief Narrative / Interim history: 78 year old male with history of PAF on anticoagulation, chronic systolic CHF, HTN, chronic bronchiectasis who comes into the hospital with acute hypoxic respiratory failure, concern for COPD exacerbation.  He was recently admitted for the same issue, treated conservatively with steroids, nebulizers and improved.  He has been doing well, saw Dr. Jude as an outpatient just a few days ago.  Starting the day prior to admission he was having more shortness of breath, as well as increased chills and muscle aches and weakness.  He checked his sats and was 84% on room air and came to the hospital.  He was placed on steroids, nebulizers, antibiotics and was admitted  Subjective / 24h Interval events: Overall feels improved today.  He was on room air for a good portion of the day yesterday, however later last night sats are in the 80s and had to be placed back on oxygen   Assesement and Plan: Principal problem Acute hypoxic respiratory failure due to COPD exacerbation -with increased dyspnea, hypoxia, green sputum production.  Continue steroids, breathing treatments, continue Pulmicort  -Continue supplemental oxygen , wean off to room air as tolerated.  Patient tells me that he was found to be desatting in the evenings when laying in bed, and was told he needs a sleep study.  This will need to be done as an outpatient -Initial flu testing was negative, then repeat testing was equivocal for influenza A, per infectious department this will be checked further regarding the subtype and will be placed on airborne precautions meanwhile.  Awaiting further results  Active problems Chronic systolic CHF-most recent 2D echo in the system in October 2024 shows LVEF 40 to 45%.  Continue metoprolol .  BNP slightly elevated on admission at 222, which is close to his prior  values. -To keep on the dry side, received Lasix  x 1 2/3.  Repeat today x 1  COPD, bronchiectasis, prior MAC -seen by pulmonology as an outpatient.  Treating exacerbation as above  Anxiety - Xanax  as needed  Elevated troponin -likely demand ischemia due to acute illness.  No chest pains.  PAF -currently in rate controlled A-fib, continue metoprolol  and Eliquis  anticoagulation  DM2-hold metformin , continue sliding scale  Lab Results  Component Value Date   HGBA1C 6.9 (H) 05/09/2023   CBG (last 3)  Recent Labs    08/13/23 1606 08/13/23 2046 08/14/23 0731  GLUCAP 189* 132* 114*    Scheduled Meds:  apixaban   5 mg Oral BID   arformoterol   15 mcg Nebulization BID   budesonide  (PULMICORT ) nebulizer solution  0.25 mg Nebulization BID   feeding supplement  237 mL Oral BID BM   guaiFENesin   600 mg Oral BID   insulin  aspart  0-15 Units Subcutaneous TID WC   insulin  aspart  0-5 Units Subcutaneous QHS   ipratropium  0.5 mg Nebulization Q6H   levalbuterol   0.63 mg Nebulization Q6H   methylPREDNISolone  (SOLU-MEDROL ) injection  40 mg Intravenous Q12H   metoprolol  succinate  25 mg Oral BID   pantoprazole   40 mg Oral Daily   triamcinolone   1 spray Each Nare Daily   Continuous Infusions:  azithromycin  500 mg (08/13/23 1421)   cefTRIAXone  (ROCEPHIN )  IV 1 g (08/13/23 1413)   PRN Meds:.acetaminophen  **OR** acetaminophen , ALPRAZolam , mouth rinse, sodium chloride   Current Outpatient Medications  Medication Instructions   Acetaminophen  (TYLENOL ) 325 MG CAPS 1-2  capsules, Every 6 hours PRN   ALPRAZolam  (XANAX ) 0.25 mg, 2 times daily PRN   apixaban  (ELIQUIS ) 5 mg, Oral, 2 times daily   Artificial Tear Solution (BION TEARS OP) 1-2 drops, Daily PRN   benzonatate  (TESSALON ) 200 mg, Oral, 3 times daily PRN   budesonide  (PULMICORT ) 0.25 mg, Nebulization, 2 times daily   Cyanocobalamin  (VITAMIN B12 PO) 1 tablet, Daily   ELDERBERRY PO 1 tablet, Daily   esomeprazole  (NEXIUM ) 20 mg, Daily    feeding supplement (ENSURE ENLIVE / ENSURE PLUS) LIQD 237 mLs, Oral, 2 times daily between meals   guaiFENesin  (MUCINEX ) 600 mg, Oral, 2 times daily   levalbuterol  (XOPENEX  HFA) 45 MCG/ACT inhaler 2 puffs, Inhalation, Every 4 hours PRN   levalbuterol  (XOPENEX ) 0.63 mg, Nebulization, Every 6 hours PRN   metoprolol  succinate (TOPROL -XL) 25 mg, Oral, 2 times daily   sodium chloride  HYPERTONIC 3 % nebulizer solution 4 mLs, Nebulization, 2 times daily   triamcinolone  (NASACORT ) 55 MCG/ACT AERO nasal inhaler 1 spray, Daily    Diet Orders (From admission, onward)     Start     Ordered   08/12/23 1642  Diet Carb Modified Fluid consistency: Thin; Room service appropriate? Yes  Diet effective now       Question Answer Comment  Diet-HS Snack? Nothing   Calorie Level Medium 1600-2000   Fluid consistency: Thin   Room service appropriate? Yes      08/12/23 1641            DVT prophylaxis: SCDs Start: 08/12/23 1639 apixaban  (ELIQUIS ) tablet 5 mg   Lab Results  Component Value Date   PLT 374 08/14/2023      Code Status: Full Code  Family Communication: no family at bedside   Status is: Inpatient  Level of care: Telemetry  Consultants:  None  Objective: Vitals:   08/14/23 0300 08/14/23 0418 08/14/23 0735 08/14/23 0911  BP:  126/81 (!) 144/75   Pulse:  88 91   Resp:  19 19   Temp:  97.7 F (36.5 C) 98.3 F (36.8 C)   TempSrc:  Oral Oral   SpO2: 97% 100% 100% 99%  Weight:      Height:        Intake/Output Summary (Last 24 hours) at 08/14/2023 1052 Last data filed at 08/14/2023 9374 Gross per 24 hour  Intake 770 ml  Output 1675 ml  Net -905 ml   Wt Readings from Last 3 Encounters:  08/12/23 67.7 kg  08/11/23 67.6 kg  08/08/23 67.6 kg    Examination:  Constitutional: NAD Eyes: lids and conjunctivae normal, no scleral icterus ENMT: mmm Neck: normal, supple Respiratory: Faint end expiratory wheezing.  No crackles Cardiovascular: Regular rate and rhythm, no  murmurs / rubs / gallops. No LE edema. Abdomen: soft, no distention, no tenderness. Bowel sounds positive.    Data Reviewed: I have independently reviewed following labs and imaging studies   CBC Recent Labs  Lab 08/11/23 0120 08/12/23 1138 08/13/23 0503 08/14/23 0424  WBC 7.3 15.5* 16.5* 17.6*  HGB 11.6* 11.9* 11.4* 11.3*  HCT 37.1* 38.1* 36.7* 36.0*  PLT 384 450* 396 374  MCV 97.4 96.5 98.4 98.1  MCH 30.4 30.1 30.6 30.8  MCHC 31.3 31.2 31.1 31.4  RDW 13.6 13.5 13.7 13.5  LYMPHSABS 0.8 0.8  --   --   MONOABS 1.1* 0.9  --   --   EOSABS 0.2 0.0  --   --   BASOSABS 0.0 0.0  --   --  Recent Labs  Lab 08/11/23 0120 08/12/23 1138 08/12/23 1217 08/12/23 1419 08/12/23 1427 08/13/23 0503 08/14/23 0424  NA 140 139  --   --   --  139 137  K 3.0* 3.1*  --   --   --  4.8 4.0  CL 103 102  --   --   --  106 98  CO2 27 25  --   --   --  26 30  GLUCOSE 113* 150*  --   --   --  127* 139*  BUN 11 18  --   --   --  21 24*  CREATININE 0.84 0.72  --   --   --  0.82 0.80  CALCIUM  8.9 9.0  --   --   --  9.1 8.8*  AST  --  21  --   --   --   --  22  ALT  --  16  --   --   --   --  16  ALKPHOS  --  58  --   --   --   --  44  BILITOT  --  0.7  --   --   --   --  0.4  ALBUMIN  --  3.2*  --   --   --   --  2.9*  MG  --   --   --   --   --   --  1.9  DDIMER  --   --   --   --  <0.27  --   --   LATICACIDVEN  --   --  2.3* 2.5*  --   --   --   BNP 333.0* 222.1*  --   --   --   --   --     ------------------------------------------------------------------------------------------------------------------ No results for input(s): CHOL, HDL, LDLCALC, TRIG, CHOLHDL, LDLDIRECT in the last 72 hours.  Lab Results  Component Value Date   HGBA1C 6.9 (H) 05/09/2023   ------------------------------------------------------------------------------------------------------------------ No results for input(s): TSH, T4TOTAL, T3FREE, THYROIDAB in the last 72 hours.  Invalid  input(s): FREET3  Cardiac Enzymes No results for input(s): CKMB, TROPONINI, MYOGLOBIN in the last 168 hours.  Invalid input(s): CK ------------------------------------------------------------------------------------------------------------------    Component Value Date/Time   BNP 222.1 (H) 08/12/2023 1138    CBG: Recent Labs  Lab 08/13/23 0744 08/13/23 1120 08/13/23 1606 08/13/23 2046 08/14/23 0731  GLUCAP 114* 182* 189* 132* 114*    Recent Results (from the past 240 hours)  Resp panel by RT-PCR (RSV, Flu A&B, Covid) Anterior Nasal Swab     Status: None   Collection Time: 08/11/23  1:28 AM   Specimen: Anterior Nasal Swab  Result Value Ref Range Status   SARS Coronavirus 2 by RT PCR NEGATIVE NEGATIVE Final    Comment: (NOTE) SARS-CoV-2 target nucleic acids are NOT DETECTED.  The SARS-CoV-2 RNA is generally detectable in upper respiratory specimens during the acute phase of infection. The lowest concentration of SARS-CoV-2 viral copies this assay can detect is 138 copies/mL. A negative result does not preclude SARS-Cov-2 infection and should not be used as the sole basis for treatment or other patient management decisions. A negative result may occur with  improper specimen collection/handling, submission of specimen other than nasopharyngeal swab, presence of viral mutation(s) within the areas targeted by this assay, and inadequate number of viral copies(<138 copies/mL). A negative result must be combined with clinical observations, patient history, and epidemiological information. The  expected result is Negative.  Fact Sheet for Patients:  bloggercourse.com  Fact Sheet for Healthcare Providers:  seriousbroker.it  This test is no t yet approved or cleared by the [place]  FDA and  has been authorized for detection and/or diagnosis of SARS-CoV-2 by FDA under an Emergency Use Authorization (EUA). This EUA  will remain  in effect (meaning this test can be used) for the duration of the COVID-19 declaration under Section 564(b)(1) of the Act, 21 U.S.C.section 360bbb-3(b)(1), unless the authorization is terminated  or revoked sooner.       Influenza A by PCR NEGATIVE NEGATIVE Final   Influenza B by PCR NEGATIVE NEGATIVE Final    Comment: (NOTE) The Xpert Xpress SARS-CoV-2/FLU/RSV plus assay is intended as an aid in the diagnosis of influenza from Nasopharyngeal swab specimens and should not be used as a sole basis for treatment. Nasal washings and aspirates are unacceptable for Xpert Xpress SARS-CoV-2/FLU/RSV testing.  Fact Sheet for Patients: bloggercourse.com  Fact Sheet for Healthcare Providers: seriousbroker.it  This test is not yet approved or cleared by the [place]  FDA and has been authorized for detection and/or diagnosis of SARS-CoV-2 by FDA under an Emergency Use Authorization (EUA). This EUA will remain in effect (meaning this test can be used) for the duration of the COVID-19 declaration under Section 564(b)(1) of the Act, 21 U.S.C. section 360bbb-3(b)(1), unless the authorization is terminated or revoked.     Resp Syncytial Virus by PCR NEGATIVE NEGATIVE Final    Comment: (NOTE) Fact Sheet for Patients: bloggercourse.com  Fact Sheet for Healthcare Providers: seriousbroker.it  This test is not yet approved or cleared by the [place]  FDA and has been authorized for detection and/or diagnosis of SARS-CoV-2 by FDA under an Emergency Use Authorization (EUA). This EUA will remain in effect (meaning this test can be used) for the duration of the COVID-19 declaration under Section 564(b)(1) of the Act, 21 U.S.C. section 360bbb-3(b)(1), unless the authorization is terminated or revoked.  Performed at [facility], [address] [place], [place] [address]   Culture, blood (Routine x 2)     Status: None (Preliminary result)   Collection Time: 08/12/23 11:38 AM   Specimen: BLOOD RIGHT ARM  Result Value Ref Range Status   Specimen Description   Final    BLOOD RIGHT ARM Performed at [facility], [address] [place], [place] [address]    Special Requests   Final    BOTTLES DRAWN AEROBIC AND ANAEROBIC Blood Culture adequate volume Performed at [facility], [address] [place], [place] [address]    Culture   Final    NO GROWTH 2 DAYS Performed at [facility], [address] [place], [place] [address]    Report Status PENDING  Incomplete  Culture, blood (Routine x 2)     Status: Abnormal (Preliminary result)   Collection Time: 08/12/23 12:05 PM   Specimen: BLOOD LEFT ARM  Result Value Ref Range Status   Specimen Description   Final    BLOOD LEFT ARM Performed at [facility], [address] [place], [place] [address]    Special Requests   Final    BOTTLES DRAWN AEROBIC AND ANAEROBIC Blood Culture adequate volume Performed at [facility], [address] [place], [place] [address]    Culture  Setup Time   Final    GRAM POSITIVE COCCI ANAEROBIC BOTTLE ONLY CRITICAL RESULT CALLED TO, READ BACK BY  AND VERIFIED WITH: [email or web address]  08/14/23 MK    Culture (A)  Final    MICROCOCCUS SPECIES micrococcus luteus Standardized susceptibility testing for this organism is not available. Performed at [facility], [address] [place], [place] [address]    Report Status PENDING  Incomplete  Blood Culture ID Panel (Reflexed)     Status: None   Collection Time: 08/12/23 12:05 PM  Result Value Ref Range Status   Enterococcus faecalis NOT DETECTED NOT DETECTED Final   Enterococcus Faecium NOT DETECTED NOT DETECTED Final   Listeria monocytogenes NOT DETECTED NOT DETECTED Final   Staphylococcus species NOT DETECTED NOT DETECTED Final    Staphylococcus aureus (BCID) NOT DETECTED NOT DETECTED Final   Staphylococcus epidermidis NOT DETECTED NOT DETECTED Final   Staphylococcus lugdunensis NOT DETECTED NOT DETECTED Final   Streptococcus species NOT DETECTED NOT DETECTED Final   Streptococcus agalactiae NOT DETECTED NOT DETECTED Final   Streptococcus pneumoniae NOT DETECTED NOT DETECTED Final   Streptococcus pyogenes NOT DETECTED NOT DETECTED Final   A.calcoaceticus-baumannii NOT DETECTED NOT DETECTED Final   Bacteroides fragilis NOT DETECTED NOT DETECTED Final   Enterobacterales NOT DETECTED NOT DETECTED Final   Enterobacter cloacae complex NOT DETECTED NOT DETECTED Final   Escherichia coli NOT DETECTED NOT DETECTED Final   Klebsiella aerogenes NOT DETECTED NOT DETECTED Final   Klebsiella oxytoca NOT DETECTED NOT DETECTED Final   Klebsiella pneumoniae NOT DETECTED NOT DETECTED Final   Proteus species NOT DETECTED NOT DETECTED Final   Salmonella species NOT DETECTED NOT DETECTED Final   Serratia marcescens NOT DETECTED NOT DETECTED Final   Haemophilus influenzae NOT DETECTED NOT DETECTED Final   Neisseria meningitidis NOT DETECTED NOT DETECTED Final   Pseudomonas aeruginosa NOT DETECTED NOT DETECTED Final   Stenotrophomonas maltophilia NOT DETECTED NOT DETECTED Final   Candida albicans NOT DETECTED NOT DETECTED Final   Candida auris NOT DETECTED NOT DETECTED Final   Candida glabrata NOT DETECTED NOT DETECTED Final   Candida krusei NOT DETECTED NOT DETECTED Final   Candida parapsilosis NOT DETECTED NOT DETECTED Final   Candida tropicalis NOT DETECTED NOT DETECTED Final   Cryptococcus neoformans/gattii NOT DETECTED NOT DETECTED Final    Comment: Performed at [facility], [address] [place], [place] [address]  Resp panel by RT-PCR (RSV, Flu A&B, Covid) Anterior Nasal Swab     Status: None   Collection Time: 08/12/23 12:54 PM   Specimen: Anterior Nasal Swab  Result Value Ref Range Status   SARS Coronavirus 2 by  RT PCR NEGATIVE NEGATIVE Final    Comment: (NOTE) SARS-CoV-2 target nucleic acids are NOT DETECTED.  The SARS-CoV-2 RNA is generally detectable in upper respiratory specimens during the acute phase of infection. The lowest concentration of SARS-CoV-2 viral copies this assay can detect is 138 copies/mL. A negative result does not preclude SARS-Cov-2 infection and should not be used as the sole basis for treatment or other patient management decisions. A negative result may occur with  improper specimen collection/handling, submission of specimen other than nasopharyngeal swab, presence of viral mutation(s) within the areas targeted by this assay, and inadequate number of viral copies(<138 copies/mL). A negative result must be combined with clinical observations, patient history, and epidemiological information. The expected result is Negative.  Fact Sheet for Patients:  bloggercourse.com  Fact Sheet for Healthcare Providers:  seriousbroker.it  This test is no t yet approved or cleared by the [place]  FDA and  has been authorized for detection and/or diagnosis  of SARS-CoV-2 by FDA under an Emergency Use Authorization (EUA). This EUA will remain  in effect (meaning this test can be used) for the duration of the COVID-19 declaration under Section 564(b)(1) of the Act, 21 U.S.C.section 360bbb-3(b)(1), unless the authorization is terminated  or revoked sooner.       Influenza A by PCR NEGATIVE NEGATIVE Final   Influenza B by PCR NEGATIVE NEGATIVE Final    Comment: (NOTE) The Xpert Xpress SARS-CoV-2/FLU/RSV plus assay is intended as an aid in the diagnosis of influenza from Nasopharyngeal swab specimens and should not be used as a sole basis for treatment. Nasal washings and aspirates are unacceptable for Xpert Xpress SARS-CoV-2/FLU/RSV testing.  Fact Sheet for Patients: bloggercourse.com  Fact Sheet  for Healthcare Providers: seriousbroker.it  This test is not yet approved or cleared by the [place]  FDA and has been authorized for detection and/or diagnosis of SARS-CoV-2 by FDA under an Emergency Use Authorization (EUA). This EUA will remain in effect (meaning this test can be used) for the duration of the COVID-19 declaration under Section 564(b)(1) of the Act, 21 U.S.C. section 360bbb-3(b)(1), unless the authorization is terminated or revoked.     Resp Syncytial Virus by PCR NEGATIVE NEGATIVE Final    Comment: (NOTE) Fact Sheet for Patients: bloggercourse.com  Fact Sheet for Healthcare Providers: seriousbroker.it  This test is not yet approved or cleared by the [place]  FDA and has been authorized for detection and/or diagnosis of SARS-CoV-2 by FDA under an Emergency Use Authorization (EUA). This EUA will remain in effect (meaning this test can be used) for the duration of the COVID-19 declaration under Section 564(b)(1) of the Act, 21 U.S.C. section 360bbb-3(b)(1), unless the authorization is terminated or revoked.  Performed at [facility], [address] [place], [place] [address]   Respiratory (~20 pathogens) panel by PCR     Status: Abnormal   Collection Time: 08/12/23  3:37 PM   Specimen: Nasopharyngeal Swab; Respiratory  Result Value Ref Range Status   Adenovirus NOT DETECTED NOT DETECTED Final   Coronavirus 229E NOT DETECTED NOT DETECTED Final    Comment: (NOTE) The Coronavirus on the Respiratory Panel, DOES NOT test for the novel  Coronavirus (2019 nCoV)    Coronavirus HKU1 NOT DETECTED NOT DETECTED Final   Coronavirus NL63 NOT DETECTED NOT DETECTED Final   Coronavirus OC43 NOT DETECTED NOT DETECTED Final   Metapneumovirus NOT DETECTED NOT DETECTED Final   Rhinovirus / Enterovirus NOT DETECTED NOT DETECTED Final   Influenza A EQUIVOCAL (A) NOT DETECTED  Final    Comment: Referred to [facility] in [place], [place] for serotyping.   Influenza B NOT DETECTED NOT DETECTED Final   Parainfluenza Virus 1 NOT DETECTED NOT DETECTED Final   Parainfluenza Virus 2 NOT DETECTED NOT DETECTED Final   Parainfluenza Virus 3 NOT DETECTED NOT DETECTED Final   Parainfluenza Virus 4 NOT DETECTED NOT DETECTED Final   Respiratory Syncytial Virus NOT DETECTED NOT DETECTED Final   Bordetella pertussis NOT DETECTED NOT DETECTED Final   Bordetella Parapertussis NOT DETECTED NOT DETECTED Final   Chlamydophila pneumoniae NOT DETECTED NOT DETECTED Final   Mycoplasma pneumoniae NOT DETECTED NOT DETECTED Final    Comment: Performed at [facility], [address], [place], [place] [address]  MRSA Next Gen by PCR, Nasal     Status: None   Collection Time: 08/13/23 12:13 AM   Specimen: Nasal Mucosa; Nasal Swab  Result Value Ref Range Status   MRSA  by PCR Next Gen NOT DETECTED NOT DETECTED Final    Comment: (NOTE) The GeneXpert MRSA Assay (FDA approved for NASAL specimens only), is one component of a comprehensive MRSA colonization surveillance program. It is not intended to diagnose MRSA infection nor to guide or monitor treatment for MRSA infections. Test performance is not FDA approved in patients less than 34 years old. Performed at [facility], [address], [place], [place] [address]      Radiology Studies: No results found.    Nilda Fendt, MD, PhD [facility]  Between 7 am - 7 pm I am available, please contact me via Amion (for emergencies) or Securechat (non urgent messages)  Between 7 pm - 7 am I am not available, please contact night coverage MD/APP via Amion

## 2023-08-14 NOTE — Progress Notes (Signed)
 PHARMACY - PHYSICIAN COMMUNICATION CRITICAL VALUE ALERT - BLOOD CULTURE IDENTIFICATION (BCID)  Matthew Hall is an 78 y.o. male who presented to [facility] on 08/12/2023 with a chief complaint of acute exacerbation of COPD  Assessment:  1/4 GPC, no BCID  Name of physician (or Provider) Contacted: JINNY Kipper  Current antibiotics: azith/ CTX  Changes to prescribed antibiotics recommended:  None probable contaminant  Results for orders placed or performed during the hospital encounter of 08/12/23  Blood Culture ID Panel (Reflexed) (Collected: 08/12/2023 12:05 PM)  Result Value Ref Range   Enterococcus faecalis NOT DETECTED NOT DETECTED   Enterococcus Faecium NOT DETECTED NOT DETECTED   Listeria monocytogenes NOT DETECTED NOT DETECTED   Staphylococcus species NOT DETECTED NOT DETECTED   Staphylococcus aureus (BCID) NOT DETECTED NOT DETECTED   Staphylococcus epidermidis NOT DETECTED NOT DETECTED   Staphylococcus lugdunensis NOT DETECTED NOT DETECTED   Streptococcus species NOT DETECTED NOT DETECTED   Streptococcus agalactiae NOT DETECTED NOT DETECTED   Streptococcus pneumoniae NOT DETECTED NOT DETECTED   Streptococcus pyogenes NOT DETECTED NOT DETECTED   A.calcoaceticus-baumannii NOT DETECTED NOT DETECTED   Bacteroides fragilis NOT DETECTED NOT DETECTED   Enterobacterales NOT DETECTED NOT DETECTED   Enterobacter cloacae complex NOT DETECTED NOT DETECTED   Escherichia coli NOT DETECTED NOT DETECTED   Klebsiella aerogenes NOT DETECTED NOT DETECTED   Klebsiella oxytoca NOT DETECTED NOT DETECTED   Klebsiella pneumoniae NOT DETECTED NOT DETECTED   Proteus species NOT DETECTED NOT DETECTED   Salmonella species NOT DETECTED NOT DETECTED   Serratia marcescens NOT DETECTED NOT DETECTED   Haemophilus influenzae NOT DETECTED NOT DETECTED   Neisseria meningitidis NOT DETECTED NOT DETECTED   Pseudomonas aeruginosa NOT DETECTED NOT DETECTED   Stenotrophomonas maltophilia NOT DETECTED NOT  DETECTED   Candida albicans NOT DETECTED NOT DETECTED   Candida auris NOT DETECTED NOT DETECTED   Candida glabrata NOT DETECTED NOT DETECTED   Candida krusei NOT DETECTED NOT DETECTED   Candida parapsilosis NOT DETECTED NOT DETECTED   Candida tropicalis NOT DETECTED NOT DETECTED   Cryptococcus neoformans/gattii NOT DETECTED NOT DETECTED    Leeroy Mace RPh 08/14/2023, 3:15 AM

## 2023-08-15 DIAGNOSIS — J441 Chronic obstructive pulmonary disease with (acute) exacerbation: Secondary | ICD-10-CM | POA: Diagnosis not present

## 2023-08-15 LAB — BASIC METABOLIC PANEL
Anion gap: 11 (ref 5–15)
BUN: 21 mg/dL (ref 8–23)
CO2: 33 mmol/L — ABNORMAL HIGH (ref 22–32)
Calcium: 8.8 mg/dL — ABNORMAL LOW (ref 8.9–10.3)
Chloride: 94 mmol/L — ABNORMAL LOW (ref 98–111)
Creatinine, Ser: 0.62 mg/dL (ref 0.61–1.24)
GFR, Estimated: >60 mL/min (ref >60)
Glucose, Bld: 175 mg/dL — ABNORMAL HIGH (ref 70–99)
Potassium: 3.6 mmol/L (ref 3.5–5.1)
Sodium: 138 mmol/L (ref 135–145)

## 2023-08-15 LAB — CBC
HCT: 37 % — ABNORMAL LOW (ref 39.0–52.0)
Hemoglobin: 11.2 g/dL — ABNORMAL LOW (ref 13.0–17.0)
MCH: 29.5 pg (ref 26.0–34.0)
MCHC: 30.3 g/dL (ref 30.0–36.0)
MCV: 97.4 fL (ref 80.0–100.0)
Platelets: 371 10*3/uL (ref 150–400)
RBC: 3.8 MIL/uL — ABNORMAL LOW (ref 4.22–5.81)
RDW: 13.5 % (ref 11.5–15.5)
WBC: 17 10*3/uL — ABNORMAL HIGH (ref 4.0–10.5)
nRBC: 0 % (ref 0.0–0.2)

## 2023-08-15 LAB — CULTURE, BLOOD (ROUTINE X 2): Special Requests: ADEQUATE

## 2023-08-15 LAB — GLUCOSE, CAPILLARY
Glucose-Capillary: 142 mg/dL — ABNORMAL HIGH (ref 70–99)
Glucose-Capillary: 214 mg/dL — ABNORMAL HIGH (ref 70–99)
Glucose-Capillary: 129 mg/dL — ABNORMAL HIGH (ref 70–99)
Glucose-Capillary: 197 mg/dL — ABNORMAL HIGH (ref 70–99)

## 2023-08-15 LAB — MAGNESIUM: Magnesium: 2 mg/dL (ref 1.7–2.4)

## 2023-08-15 NOTE — Evaluation (Addendum)
 Occupational Therapy Evaluation Patient Details Name: Matthew Hall MRN: [id number] DOB: Mar 06, 1946 Today's Date: 08/15/2023   History of Present Illness 78 year old male who comes into the hospital with acute hypoxic respiratory failure, concern for COPD exacerbation. Pt with history of PAF on anticoagulation, chronic systolic CHF, HTN, chronic bronchiectasis. Recent admission 07/12/23 for respiratory failure and 06/17/23 for pneumonia and sepsis.   Clinical Impression   Pt presents with decline in function and safety with ADLs and ADL mobility with impaired strength, balance and endurance. PTA, pt recently moved into [facility]  and was Ind with ADLs/selfcare, does not drive, ambulates with rollator; pt assists wife who is disabled but she is currently at [facility] for rehab. Pt currently requires min A with LB ADLs, CGA with toileting and CGA with mobility using RW. Pt's O2 SATs at rest on RA 93%, dropping to 83% after in room ADL and mobility activities. Pt would benefit from acute OT services to address impairments to maximize level of function and safety      If plan is discharge home, recommend the following: A little help with bathing/dressing/bathroom;A little help with walking and/or transfers    Functional Status Assessment  Patient has had a recent decline in their functional status and demonstrates the ability to make significant improvements in function in a reasonable and predictable amount of time.  Equipment Recommendations  None recommended by OT    Recommendations for Other Services       Precautions / Restrictions Precautions Precautions: Other (comment) Precaution Comments: monitor O2, denies falls in past 6 months Restrictions Weight Bearing Restrictions Per Provider Order: No      Mobility Bed Mobility               General bed mobility comments: pt in recliner    Transfers Overall transfer level: Needs assistance   Transfers: Sit to/from  Stand Sit to Stand: Contact guard assist           General transfer comment: VCs hand placement      Balance Overall balance assessment: Mild deficits observed, not formally tested                                         ADL either performed or assessed with clinical judgement   ADL Overall ADL's : Needs assistance/impaired Eating/Feeding: Independent   Grooming: Wash/dry hands;Wash/dry face;Brushing hair;Contact guard assist;Standing   Upper Body Bathing: Supervision/ safety   Lower Body Bathing: Minimal assistance   Upper Body Dressing : Supervision/safety   Lower Body Dressing: Minimal assistance   Toilet Transfer: Contact guard assist;Ambulation;Regular Toilet;Grab bars;Rolling walker (2 wheels)   Toileting- Clothing Manipulation and Hygiene: Minimal assistance;Sit to/from stand       Functional mobility during ADLs: Contact guard assist;Rolling walker (2 wheels)       Vision Baseline Vision/History: 1 Wears glasses Ability to See in Adequate Light: 0 Adequate Patient Visual Report: No change from baseline       Perception         Praxis         Pertinent Vitals/Pain Pain Assessment Pain Assessment: No/denies pain     Extremity/Trunk Assessment Upper Extremity Assessment Upper Extremity Assessment: Generalized weakness   Lower Extremity Assessment Lower Extremity Assessment: Defer to PT evaluation   Cervical / Trunk Assessment Cervical / Trunk Assessment: Normal   Communication Communication Communication: No apparent difficulties  Cognition Arousal: Alert Behavior During Therapy: WFL for tasks assessed/performed Overall Cognitive Status: Within Functional Limits for tasks assessed                                       General Comments       Exercises     Shoulder Instructions      Home Living Family/patient expects to be discharged to:: Private residence Living Arrangements: Alone Available  Help at Discharge: Family;Available PRN/intermittently Type of Home: Independent living facility Home Access: Level entry     Home Layout: One level     Bathroom Shower/Tub: Chief Strategy Officer: Standard Bathroom Accessibility: Yes How Accessible: Accessible via walker Home Equipment: Tub bench;Wheelchair Financial Trader (4 wheels);Rolling Walker (2 wheels);Hand held shower head;Adaptive equipment   Additional Comments: spouse is again currently in SNF for broken ankle; pt just moved to [facility] IL[facility] 8-10 days ago      Prior Functioning/Environment Prior Level of Function : Independent/Modified Independent             Mobility Comments: ambulates with rollator; at baseline assists wife who is disable but she is currently at SNF ADLs Comments: Ind with ADLs/selfcare, does not drive.        OT Problem List: Decreased strength;Impaired balance (sitting and/or standing);Decreased coordination;Decreased activity tolerance      OT Treatment/Interventions: Self-care/ADL training;DME and/or AE instruction;Therapeutic activities;Balance training;Patient/family education    OT Goals(Current goals can be found in the care plan section) Acute Rehab OT Goals Patient Stated Goal: go home OT Goal Formulation: With patient Time For Goal Achievement: 08/29/23 Potential to Achieve Goals: Good ADL Goals Pt Will Perform Grooming: with supervision;with set-up;standing Pt Will Perform Upper Body Bathing: with set-up Pt Will Perform Lower Body Bathing: with contact guard assist;with supervision Pt Will Perform Upper Body Dressing: with set-up Pt Will Perform Lower Body Dressing: with contact guard assist;with supervision;sit to/from stand Pt Will Transfer to Toilet: with supervision;ambulating Pt Will Perform Toileting - Clothing Manipulation and hygiene: with supervision;sit to/from stand  OT Frequency: Min 1X/week    Co-evaluation              AM-PAC OT 6  Clicks Daily Activity     Outcome Measure Help from another person eating meals?: None Help from another person taking care of personal grooming?: A Little Help from another person toileting, which includes using toliet, bedpan, or urinal?: A Little Help from another person bathing (including washing, rinsing, drying)?: A Little Help from another person to put on and taking off regular upper body clothing?: A Little Help from another person to put on and taking off regular lower body clothing?: A Little 6 Click Score: 19   End of Session Equipment Utilized During Treatment: Gait belt;Rolling walker (2 wheels)  Activity Tolerance: Patient tolerated treatment well Patient left: in chair;with call bell/phone within reach  OT Visit Diagnosis: Unsteadiness on feet (R26.81);Other abnormalities of gait and mobility (R26.89);Muscle weakness (generalized) (M62.81)                Time: 8951-8883 OT Time Calculation (min): 28 min Charges:  OT General Charges $OT Visit: 1 Visit OT Evaluation $OT Eval Moderate Complexity: 1 Mod OT Treatments $Therapeutic Activity: 8-22 mins    Jacques Karna Loose 08/15/2023, 2:18 PM

## 2023-08-15 NOTE — Progress Notes (Signed)
 SATURATION QUALIFICATIONS: (This note is used to comply with regulatory documentation for home oxygen )  Patient Saturations on Room Air at Rest = 94%  Patient Saturations on Room Air while Ambulating = 80%   Please briefly explain why patient needs home oxygen : to maintain appropriate SpO2 levels  Sylvan Delon Copp PT 08/15/2023  Acute Rehabilitation Services  Office [phone]

## 2023-08-15 NOTE — Progress Notes (Signed)
 PROGRESS NOTE    Matthew Hall  FMW:[id number] DOB: 08/25/1945 DOA: 08/12/2023 PCP: Matthew Senior, MD   Brief Narrative:  Matthew Hall is a pleasant 78 year old male with history of PAF on anticoagulation, chronic systolic CHF, HTN, chronic bronchiectasis who returns to the hospital with acute hypoxic respiratory failure, concern for COPD exacerbation.  Assessment & Plan: Principal Problem:   COPD with acute exacerbation (HCC) Active Problems:   COPD exacerbation (HCC)   Acute hypoxic respiratory failure due to COPD exacerbation Rule out influenza infection -Continue supportive care, steroids, nebulizer treatments, Pulmicort  -Continue supplemental oxygen , baseline on room air, wean as appropriate -Patient reports need for sleep study which he states is difficult to be scheduled outpatient -Initial flu testing negative, subsequently noted to be positive, ID following for speciation/subtype -Continue precautions in the interim  Chronic systolic CHF -Echo 2024 shows EF 40 to 45% -Not currently in exacerbation -Holding diuretics, continue fluid restriction   COPD, bronchiectasis, prior MAC Questionable sleep apnea -Follows with outpatient pulmonology, discussed follow-up after discharge -Patient indicates he is having difficulty getting scheduled for sleep apnea testing, defer to outpatient pulmonology   Anxiety - Xanax  as needed Elevated troponin -likely demand ischemia due to acute illness.  No chest pain PAF -currently in rate controlled A-fib, continue metoprolol  and Eliquis  anticoagulation DM2-hold metformin , continue sliding scale  DVT prophylaxis: SCDs Start: 08/12/23 1639 apixaban  (ELIQUIS ) tablet 5 mg   Code Status:   Code Status: Full Code  Family Communication: None present  Status is: Inpatient  Dispo: The patient is from: Home              Anticipated d/c is to: Home              Anticipated d/c date is: 24 to 48 hours              Patient currently  not medically stable for discharge  Consultants:  None  Procedures:  None  Antimicrobials:  Ceftriaxone , azithromycin   Subjective: No acute issues or events overnight, respiratory status improving but not yet back to baseline, continues to be quite dyspneic even with short distance walking.  Otherwise denies nausea vomiting diarrhea constipation fevers chills or chest pain  Objective: Vitals:   08/15/23 0335 08/15/23 0400 08/15/23 0600 08/15/23 0605  BP: 134/76     Pulse: 99     Resp: 20     Temp: 97.7 F (36.5 C)     TempSrc:      SpO2: 100% (!) 88% (!) 88% 98%  Weight:      Height:        Intake/Output Summary (Last 24 hours) at 08/15/2023 0735 Last data filed at 08/15/2023 0600 Gross per 24 hour  Intake 600 ml  Output 2100 ml  Net -1500 ml   Filed Weights   08/12/23 2133  Weight: 67.7 kg    Examination:  General:  Pleasantly resting in bed, No acute distress. HEENT:  Normocephalic atraumatic.  Sclerae nonicteric, noninjected.  Extraocular movements intact bilaterally. Neck:  Without mass or deformity.  Trachea is midline. Lungs: Bilateral rhonchi. Heart:  Regular rate and rhythm.  Without murmurs, rubs, or gallops. Abdomen:  Soft, nontender, nondistended.  Without guarding or rebound. Extremities: Without cyanosis, clubbing, edema, or obvious deformity. Skin:  Warm and dry, no erythema.  Data Reviewed: I have personally reviewed following labs and imaging studies  CBC: Recent Labs  Lab 08/11/23 0120 08/12/23 1138 08/13/23 0503 08/14/23 0424 08/15/23 0418  WBC 7.3 15.5* 16.5*  17.6* 17.0*  NEUTROABS 5.1 13.6*  --   --   --   HGB 11.6* 11.9* 11.4* 11.3* 11.2*  HCT 37.1* 38.1* 36.7* 36.0* 37.0*  MCV 97.4 96.5 98.4 98.1 97.4  PLT 384 450* 396 374 371   Basic Metabolic Panel: Recent Labs  Lab 08/11/23 0120 08/12/23 1138 08/13/23 0503 08/14/23 0424 08/15/23 0418  NA 140 139 139 137 138  K 3.0* 3.1* 4.8 4.0 3.6  CL 103 102 106 98 94*  CO2 27 25 26  30  33*  GLUCOSE 113* 150* 127* 139* 175*  BUN 11 18 21  24* 21  CREATININE 0.84 0.72 0.82 0.80 0.62  CALCIUM  8.9 9.0 9.1 8.8* 8.8*  MG  --   --   --  1.9 2.0  PHOS  --   --   --  2.9  --    GFR: Estimated Creatinine Clearance: 71.7 mL/min (by C-G formula based on SCr of 0.62 mg/dL). Liver Function Tests: Recent Labs  Lab 08/12/23 1138 08/14/23 0424  AST 21 22  ALT 16 16  ALKPHOS 58 44  BILITOT 0.7 0.4  PROT 7.0 6.5  ALBUMIN 3.2* 2.9*   CBG: Recent Labs  Lab 08/13/23 2046 08/14/23 0731 08/14/23 1134 08/14/23 1616 08/14/23 2053  GLUCAP 132* 114* 191* 162* 141*   Sepsis Labs: Recent Labs  Lab 08/12/23 1217 08/12/23 1419  LATICACIDVEN 2.3* 2.5*    Recent Results (from the past 240 hours)  Resp panel by RT-PCR (RSV, Flu A&B, Covid) Anterior Nasal Swab     Status: None   Collection Time: 08/11/23  1:28 AM   Specimen: Anterior Nasal Swab  Result Value Ref Range Status   SARS Coronavirus 2 by RT PCR NEGATIVE NEGATIVE Final    Comment: (NOTE) SARS-CoV-2 target nucleic acids are NOT DETECTED.  The SARS-CoV-2 RNA is generally detectable in upper respiratory specimens during the acute phase of infection. The lowest concentration of SARS-CoV-2 viral copies this assay can detect is 138 copies/mL. A negative result does not preclude SARS-Cov-2 infection and should not be used as the sole basis for treatment or other patient management decisions. A negative result may occur with  improper specimen collection/handling, submission of specimen other than nasopharyngeal swab, presence of viral mutation(s) within the areas targeted by this assay, and inadequate number of viral copies(<138 copies/mL). A negative result must be combined with clinical observations, patient history, and epidemiological information. The expected result is Negative.  Fact Sheet for Patients:  bloggercourse.com  Fact Sheet for Healthcare Providers:   seriousbroker.it  This test is no t yet approved or cleared by the [place]  FDA and  has been authorized for detection and/or diagnosis of SARS-CoV-2 by FDA under an Emergency Use Authorization (EUA). This EUA will remain  in effect (meaning this test can be used) for the duration of the COVID-19 declaration under Section 564(b)(1) of the Act, 21 U.S.C.section 360bbb-3(b)(1), unless the authorization is terminated  or revoked sooner.       Influenza A by PCR NEGATIVE NEGATIVE Final   Influenza B by PCR NEGATIVE NEGATIVE Final    Comment: (NOTE) The Xpert Xpress SARS-CoV-2/FLU/RSV plus assay is intended as an aid in the diagnosis of influenza from Nasopharyngeal swab specimens and should not be used as a sole basis for treatment. Nasal washings and aspirates are unacceptable for Xpert Xpress SARS-CoV-2/FLU/RSV testing.  Fact Sheet for Patients: bloggercourse.com  Fact Sheet for Healthcare Providers: seriousbroker.it  This test is not yet approved or cleared by the  [place]  FDA and has been authorized for detection and/or diagnosis of SARS-CoV-2 by FDA under an Emergency Use Authorization (EUA). This EUA will remain in effect (meaning this test can be used) for the duration of the COVID-19 declaration under Section 564(b)(1) of the Act, 21 U.S.C. section 360bbb-3(b)(1), unless the authorization is terminated or revoked.     Resp Syncytial Virus by PCR NEGATIVE NEGATIVE Final    Comment: (NOTE) Fact Sheet for Patients: bloggercourse.com  Fact Sheet for Healthcare Providers: seriousbroker.it  This test is not yet approved or cleared by the [place]  FDA and has been authorized for detection and/or diagnosis of SARS-CoV-2 by FDA under an Emergency Use Authorization (EUA). This EUA will remain in effect (meaning this test can be used) for  the duration of the COVID-19 declaration under Section 564(b)(1) of the Act, 21 U.S.C. section 360bbb-3(b)(1), unless the authorization is terminated or revoked.  Performed at [facility], [address], [place], [place] [address]   Culture, blood (Routine x 2)     Status: None (Preliminary result)   Collection Time: 08/12/23 11:38 AM   Specimen: BLOOD RIGHT ARM  Result Value Ref Range Status   Specimen Description   Final    BLOOD RIGHT ARM Performed at [facility], [address] [place], [place] [address]    Special Requests   Final    BOTTLES DRAWN AEROBIC AND ANAEROBIC Blood Culture adequate volume Performed at [facility], [address] [place], [place] [address]    Culture   Final    NO GROWTH 3 DAYS Performed at [facility], [address], [place], [place] [address]    Report Status PENDING  Incomplete  Culture, blood (Routine x 2)     Status: Abnormal   Collection Time: 08/12/23 12:05 PM   Specimen: BLOOD LEFT ARM  Result Value Ref Range Status   Specimen Description   Final    BLOOD LEFT ARM Performed at [facility], [address], [place], [place] [address]    Special Requests   Final    BOTTLES DRAWN AEROBIC AND ANAEROBIC Blood Culture adequate volume Performed at [facility], [address] [place], [place] [address]    Culture  Setup Time   Final    GRAM POSITIVE COCCI ANAEROBIC BOTTLE ONLY CRITICAL RESULT CALLED TO, READ BACK BY AND VERIFIED WITH: [email or web address]  08/14/23 MK    Culture (A)  Final    MICROCOCCUS SPECIES micrococcus luteus Standardized susceptibility testing for this organism is not available. Performed at [facility], [address], [place], [place] [address]    Report Status 08/15/2023 FINAL  Final  Blood Culture ID Panel (Reflexed)     Status: None   Collection Time: 08/12/23 12:05 PM  Result Value Ref Range Status   Enterococcus  faecalis NOT DETECTED NOT DETECTED Final   Enterococcus Faecium NOT DETECTED NOT DETECTED Final   Listeria monocytogenes NOT DETECTED NOT DETECTED Final   Staphylococcus species NOT DETECTED NOT DETECTED Final   Staphylococcus aureus (BCID) NOT DETECTED NOT DETECTED Final   Staphylococcus epidermidis NOT DETECTED NOT DETECTED Final   Staphylococcus lugdunensis NOT DETECTED NOT DETECTED Final   Streptococcus species NOT DETECTED NOT DETECTED Final   Streptococcus agalactiae NOT DETECTED NOT DETECTED Final   Streptococcus pneumoniae NOT DETECTED NOT DETECTED Final   Streptococcus pyogenes NOT DETECTED NOT DETECTED Final   A.calcoaceticus-baumannii NOT DETECTED NOT DETECTED Final  Bacteroides fragilis NOT DETECTED NOT DETECTED Final   Enterobacterales NOT DETECTED NOT DETECTED Final   Enterobacter cloacae complex NOT DETECTED NOT DETECTED Final   Escherichia coli NOT DETECTED NOT DETECTED Final   Klebsiella aerogenes NOT DETECTED NOT DETECTED Final   Klebsiella oxytoca NOT DETECTED NOT DETECTED Final   Klebsiella pneumoniae NOT DETECTED NOT DETECTED Final   Proteus species NOT DETECTED NOT DETECTED Final   Salmonella species NOT DETECTED NOT DETECTED Final   Serratia marcescens NOT DETECTED NOT DETECTED Final   Haemophilus influenzae NOT DETECTED NOT DETECTED Final   Neisseria meningitidis NOT DETECTED NOT DETECTED Final   Pseudomonas aeruginosa NOT DETECTED NOT DETECTED Final   Stenotrophomonas maltophilia NOT DETECTED NOT DETECTED Final   Candida albicans NOT DETECTED NOT DETECTED Final   Candida auris NOT DETECTED NOT DETECTED Final   Candida glabrata NOT DETECTED NOT DETECTED Final   Candida krusei NOT DETECTED NOT DETECTED Final   Candida parapsilosis NOT DETECTED NOT DETECTED Final   Candida tropicalis NOT DETECTED NOT DETECTED Final   Cryptococcus neoformans/gattii NOT DETECTED NOT DETECTED Final    Comment: Performed at [facility], [address], [place], [place]  [address]  Resp panel by RT-PCR (RSV, Flu A&B, Covid) Anterior Nasal Swab     Status: None   Collection Time: 08/12/23 12:54 PM   Specimen: Anterior Nasal Swab  Result Value Ref Range Status   SARS Coronavirus 2 by RT PCR NEGATIVE NEGATIVE Final    Comment: (NOTE) SARS-CoV-2 target nucleic acids are NOT DETECTED.  The SARS-CoV-2 RNA is generally detectable in upper respiratory specimens during the acute phase of infection. The lowest concentration of SARS-CoV-2 viral copies this assay can detect is 138 copies/mL. A negative result does not preclude SARS-Cov-2 infection and should not be used as the sole basis for treatment or other patient management decisions. A negative result may occur with  improper specimen collection/handling, submission of specimen other than nasopharyngeal swab, presence of viral mutation(s) within the areas targeted by this assay, and inadequate number of viral copies(<138 copies/mL). A negative result must be combined with clinical observations, patient history, and epidemiological information. The expected result is Negative.  Fact Sheet for Patients:  bloggercourse.com  Fact Sheet for Healthcare Providers:  seriousbroker.it  This test is no t yet approved or cleared by the [place]  FDA and  has been authorized for detection and/or diagnosis of SARS-CoV-2 by FDA under an Emergency Use Authorization (EUA). This EUA will remain  in effect (meaning this test can be used) for the duration of the COVID-19 declaration under Section 564(b)(1) of the Act, 21 U.S.C.section 360bbb-3(b)(1), unless the authorization is terminated  or revoked sooner.       Influenza A by PCR NEGATIVE NEGATIVE Final   Influenza B by PCR NEGATIVE NEGATIVE Final    Comment: (NOTE) The Xpert Xpress SARS-CoV-2/FLU/RSV plus assay is intended as an aid in the diagnosis of influenza from Nasopharyngeal swab specimens and should not be  used as a sole basis for treatment. Nasal washings and aspirates are unacceptable for Xpert Xpress SARS-CoV-2/FLU/RSV testing.  Fact Sheet for Patients: bloggercourse.com  Fact Sheet for Healthcare Providers: seriousbroker.it  This test is not yet approved or cleared by the [place]  FDA and has been authorized for detection and/or diagnosis of SARS-CoV-2 by FDA under an Emergency Use Authorization (EUA). This EUA will remain in effect (meaning this test can be used) for the duration of the COVID-19 declaration under Section 564(b)(1) of the  Act, 21 U.S.C. section 360bbb-3(b)(1), unless the authorization is terminated or revoked.     Resp Syncytial Virus by PCR NEGATIVE NEGATIVE Final    Comment: (NOTE) Fact Sheet for Patients: bloggercourse.com  Fact Sheet for Healthcare Providers: seriousbroker.it  This test is not yet approved or cleared by the [place]  FDA and has been authorized for detection and/or diagnosis of SARS-CoV-2 by FDA under an Emergency Use Authorization (EUA). This EUA will remain in effect (meaning this test can be used) for the duration of the COVID-19 declaration under Section 564(b)(1) of the Act, 21 U.S.C. section 360bbb-3(b)(1), unless the authorization is terminated or revoked.  Performed at [facility], [address] [place], [place] [address]   Respiratory (~20 pathogens) panel by PCR     Status: Abnormal   Collection Time: 08/12/23  3:37 PM   Specimen: Nasopharyngeal Swab; Respiratory  Result Value Ref Range Status   Adenovirus NOT DETECTED NOT DETECTED Final   Coronavirus 229E NOT DETECTED NOT DETECTED Final    Comment: (NOTE) The Coronavirus on the Respiratory Panel, DOES NOT test for the novel  Coronavirus (2019 nCoV)    Coronavirus HKU1 NOT DETECTED NOT DETECTED Final   Coronavirus NL63 NOT DETECTED NOT  DETECTED Final   Coronavirus OC43 NOT DETECTED NOT DETECTED Final   Metapneumovirus NOT DETECTED NOT DETECTED Final   Rhinovirus / Enterovirus NOT DETECTED NOT DETECTED Final   Influenza A EQUIVOCAL (A) NOT DETECTED Final    Comment: Referred to [facility] in [place], [place] for serotyping.   Influenza B NOT DETECTED NOT DETECTED Final   Parainfluenza Virus 1 NOT DETECTED NOT DETECTED Final   Parainfluenza Virus 2 NOT DETECTED NOT DETECTED Final   Parainfluenza Virus 3 NOT DETECTED NOT DETECTED Final   Parainfluenza Virus 4 NOT DETECTED NOT DETECTED Final   Respiratory Syncytial Virus NOT DETECTED NOT DETECTED Final   Bordetella pertussis NOT DETECTED NOT DETECTED Final   Bordetella Parapertussis NOT DETECTED NOT DETECTED Final   Chlamydophila pneumoniae NOT DETECTED NOT DETECTED Final   Mycoplasma pneumoniae NOT DETECTED NOT DETECTED Final    Comment: Performed at [facility], [address] [place], [place] [address]  MRSA Next Gen by PCR, Nasal     Status: None   Collection Time: 08/13/23 12:13 AM   Specimen: Nasal Mucosa; Nasal Swab  Result Value Ref Range Status   MRSA by PCR Next Gen NOT DETECTED NOT DETECTED Final    Comment: (NOTE) The GeneXpert MRSA Assay (FDA approved for NASAL specimens only), is one component of a comprehensive MRSA colonization surveillance program. It is not intended to diagnose MRSA infection nor to guide or monitor treatment for MRSA infections. Test performance is not FDA approved in patients less than 72 years old. Performed at [facility], [address], [place], [place] [address]          Radiology Studies: No results found.      Scheduled Meds:  apixaban   5 mg Oral BID   arformoterol   15 mcg Nebulization BID   budesonide  (PULMICORT ) nebulizer solution  0.25 mg Nebulization BID   feeding supplement  237 mL Oral BID BM   guaiFENesin   600 mg Oral BID   insulin  aspart  0-15 Units Subcutaneous TID WC    insulin  aspart  0-5 Units Subcutaneous QHS   ipratropium  0.5 mg Nebulization Q6H   levalbuterol   0.63 mg Nebulization Q6H   methylPREDNISolone  (SOLU-MEDROL ) injection  40 mg Intravenous  Q12H   metoprolol  succinate  25 mg Oral BID   pantoprazole   40 mg Oral Daily   triamcinolone   1 spray Each Nare Daily   Continuous Infusions:  azithromycin  500 mg (08/14/23 1529)   cefTRIAXone  (ROCEPHIN )  IV 1 g (08/14/23 1525)     LOS: 2 days   Time spent:  Elsie JAYSON Mont[place], DO [facility]  If 7PM-7AM, please contact night-coverage [email or web address]  08/15/2023, 7:35 AM

## 2023-08-15 NOTE — TOC Progression Note (Signed)
 Transition of Care [facility]) - Progression Note    Patient Details  Name: MARIO CORONADO MRN: [id number] Date of Birth: 06-07-1946  [facility] CM/SW Contact  Tawni CHRISTELLA Eva, LCSW Phone Number: 08/15/2023, 4:26 PM  Clinical Narrative:     Pt is active with [facility] for Home health service. TOC to follow.        Expected Discharge Plan and Services                                               Social Determinants of Health (SDOH) Interventions SDOH Screenings   Food Insecurity: No Food Insecurity (08/12/2023)  Recent Concern: Food Insecurity - Food Insecurity Present (06/18/2023)  Housing: Low Risk  (08/12/2023)  Transportation Needs: No Transportation Needs (08/12/2023)  Recent Concern: Transportation Needs - Unmet Transportation Needs (07/13/2023)  Utilities: Not At Risk (08/12/2023)  Depression (PHQ2-9): Low Risk  (05/04/2019)  Social Connections: Moderately Isolated (08/12/2023)  Tobacco Use: Medium Risk (08/12/2023)    Readmission Risk Interventions    07/15/2023    2:01 PM 06/22/2023   11:49 AM  Readmission Risk Prevention Plan  Transportation Screening Complete Complete  Home Care Screening  Complete  Medication Review (RN CM)  Complete  Medication Review (RN Care Manager) Complete   PCP or Specialist appointment within 3-5 days of discharge Complete   HRI or Home Care Consult Complete   SW Recovery Care/Counseling Consult Complete   Palliative Care Screening Not Applicable   Skilled Nursing Facility Not Applicable

## 2023-08-15 NOTE — Evaluation (Signed)
 Physical Therapy Evaluation Patient Details Name: Matthew Hall MRN: [id number] DOB: 03/03/1946 Today's Date: 08/15/2023  History of Present Illness  78 year old male who comes into the hospital with acute hypoxic respiratory failure, concern for COPD exacerbation. Pt with history of PAF on anticoagulation, chronic systolic CHF, HTN, chronic bronchiectasis. Recent admission 07/12/23 for respiratory failure and 06/17/23 for pneumonia and sepsis.  Clinical Impression  Pt admitted with above diagnosis. 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 He reports he moved to [facility] ~8-10 days ago and stated his wife is currently in a SNF for rehab following an ankle fracture. Today he ambulated 50' with RW, SpO2 80% on room air walking, 94% room air at rest, ambulation distance limited by 3/4 dyspnea. Good progress expected.  Pt currently with functional limitations due to the deficits listed below (see PT Problem List). Pt will benefit from acute skilled PT to increase their independence and safety with mobility to allow discharge.           If plan is discharge home, recommend the following: Assistance with cooking/housework;Assist for transportation;Help with stairs or ramp for entrance   Can travel by private vehicle        Equipment Recommendations None recommended by PT  Recommendations for Other Services       Functional Status Assessment Patient has had a recent decline in their functional status and demonstrates the ability to make significant improvements in function in a reasonable and predictable amount of time.     Precautions / Restrictions Precautions Precautions: Other (comment) Precaution Comments: monitor O2, denies falls in past 6 months Restrictions Weight Bearing Restrictions Per Provider Order: No      Mobility  Bed Mobility Overal bed mobility: Modified Independent              General bed mobility comments: HOB elevated    Transfers Overall transfer level: Needs assistance Equipment used: Rolling walker (2 wheels) Transfers: Sit to/from Stand Sit to Stand: Contact guard assist, From elevated surface           General transfer comment: VCs hand placement    Ambulation/Gait Ambulation/Gait assistance: Contact guard assist Gait Distance (Feet): 70 Feet Assistive device: Rolling walker (2 wheels) Gait Pattern/deviations: Step-to pattern, Decreased dorsiflexion - right, Steppage Gait velocity: WFL     General Gait Details: steppage on R 2* baseline foot drop, no loss of balance, SpO2 80% on room air walking, 3/4 dyspnea, 94% room air at rest prior to walking, 94% on 2L O2 after 2 minutes seated rest following walk  Stairs            Wheelchair Mobility     Tilt Bed    Modified Rankin (Stroke Patients Only)       Balance Overall balance assessment: Modified Independent                                           Pertinent Vitals/Pain Pain Assessment Pain Assessment: No/denies pain    Home Living Family/patient expects to be discharged to:: Private residence Living Arrangements: Alone Available Help at Discharge: Family;Available PRN/intermittently Type of Home: Independent living facility Home Access: Level entry       Home Layout: One level   Additional Comments: spouse is again currently in SNF for broken ankle;  pt just moved to [facility] 8-10 days ago    Prior Function Prior Level of Function : Independent/Modified Independent             Mobility Comments: ambulates with rollator; at baseline assists wife who is disable but she is currently at SNF ADLs Comments: independent     Extremity/Trunk Assessment   Upper Extremity Assessment Upper Extremity Assessment: Defer to OT evaluation    Lower Extremity Assessment Lower Extremity Assessment: RLE deficits/detail RLE Deficits /  Details: R foot drop at baseline (from a stroke per pt); knee ext +4/5 RLE Sensation: decreased light touch    Cervical / Trunk Assessment Cervical / Trunk Assessment: Normal  Communication   Communication Communication: No apparent difficulties  Cognition Arousal: Alert Behavior During Therapy: WFL for tasks assessed/performed Overall Cognitive Status: Within Functional Limits for tasks assessed                                          General Comments      Exercises     Assessment/Plan    PT Assessment Patient needs continued PT services  PT Problem List Decreased activity tolerance;Cardiopulmonary status limiting activity       PT Treatment Interventions Gait training;Functional mobility training;Patient/family education;Therapeutic exercise    PT Goals (Current goals can be found in the Care Plan section)  Acute Rehab PT Goals Patient Stated Goal: improve breathing, return to [facility] and do HHPT PT Goal Formulation: With patient Time For Goal Achievement: 08/29/23 Potential to Achieve Goals: Good    Frequency Min 1X/week     Co-evaluation               AM-PAC PT 6 Clicks Mobility  Outcome Measure Help needed turning from your back to your side while in a flat bed without using bedrails?: None Help needed moving from lying on your back to sitting on the side of a flat bed without using bedrails?: A Little Help needed moving to and from a bed to a chair (including a wheelchair)?: A Little Help needed standing up from a chair using your arms (e.g., wheelchair or bedside chair)?: A Little Help needed to walk in hospital room?: A Little Help needed climbing 3-5 steps with a railing? : A Little 6 Click Score: 19    End of Session Equipment Utilized During Treatment: Gait belt;Oxygen  Activity Tolerance: Patient limited by fatigue Patient left: in chair;with call bell/phone within reach Nurse Communication: Mobility status PT  Visit Diagnosis: Difficulty in walking, not elsewhere classified (R26.2)    Time: 9146-9079 PT Time Calculation (min) (ACUTE ONLY): 27 min   Charges:   PT Evaluation $PT Eval Moderate Complexity: 1 Mod PT Treatments $Gait Training: 8-22 mins PT General Charges $$ ACUTE PT VISIT: 1 Visit         Sylvan Delon Copp PT 08/15/2023  Acute Rehabilitation Services  Office [phone]

## 2023-08-16 DIAGNOSIS — J441 Chronic obstructive pulmonary disease with (acute) exacerbation: Secondary | ICD-10-CM | POA: Diagnosis not present

## 2023-08-16 LAB — GLUCOSE, CAPILLARY
Glucose-Capillary: 110 mg/dL — ABNORMAL HIGH (ref 70–99)
Glucose-Capillary: 151 mg/dL — ABNORMAL HIGH (ref 70–99)
Glucose-Capillary: 207 mg/dL — ABNORMAL HIGH (ref 70–99)
Glucose-Capillary: 141 mg/dL — ABNORMAL HIGH (ref 70–99)

## 2023-08-16 MED ORDER — NYSTATIN 100000 UNIT/ML MT SUSP: 5.0000 mL | Freq: Four times a day (QID) | OROMUCOSAL | Status: DC

## 2023-08-16 MED ORDER — NYSTATIN 100000 UNIT/ML MT SUSP
5.0000 mL | Freq: Four times a day (QID) | OROMUCOSAL | Status: DC
  Administered 2023-08-16 – 2023-08-17 (×6): 500000 [IU] via ORAL
  Filled 2023-08-16 (×6): qty 5

## 2023-08-16 NOTE — Progress Notes (Signed)
 PROGRESS NOTE    RAYNOR CALCATERRA  FMW:[id number] DOB: 1945-08-26 DOA: 08/12/2023 PCP: [name], MD   Brief Narrative:  Mr. Buczynski is a pleasant 78 year old male with history of PAF on anticoagulation, chronic systolic CHF, HTN, chronic bronchiectasis who returns to the hospital with acute hypoxic respiratory failure, concern for COPD exacerbation.  Assessment & Plan: Principal Problem:   COPD with acute exacerbation (HCC) Active Problems:   COPD exacerbation (HCC)   Acute hypoxic respiratory failure due to COPD exacerbation, ongoing Rule out influenza infection -Continue supportive care, steroids, nebulizer treatments, Pulmicort  -Continue supplemental oxygen , baseline on room air, wean as appropriate -Patient reports need for sleep study which he states is difficult to be scheduled outpatient -Influenza test equivocal/indeterminate - ID following for speciation/subtype -Continue precautions in the interim  Chronic systolic CHF -Echo 2024 shows EF 40 to 45% -Not currently in exacerbation -Holding diuretics, continue fluid restriction   COPD, bronchiectasis, prior MAC Questionable sleep apnea -Follows with outpatient pulmonology, discussed follow-up after discharge -Patient indicates he is having difficulty getting scheduled for sleep apnea testing, defer to outpatient pulmonology   Anxiety - Xanax  as needed Elevated troponin -likely demand ischemia due to acute illness.  No chest pain PAF -currently in rate controlled A-fib, continue metoprolol  and Eliquis  anticoagulation DM2-hold metformin , continue sliding scale  DVT prophylaxis: SCDs Start: 08/12/23 1639 apixaban  (ELIQUIS ) tablet 5 mg   Code Status:   Code Status: Full Code  Family Communication: None present  Status is: Inpatient  Dispo: The patient is from: Home              Anticipated d/c is to: Home              Anticipated d/c date is: 24 to 48 hours              Patient currently not medically  stable for discharge  Consultants:  None  Procedures:  None  Antimicrobials:  Ceftriaxone , azithromycin   Subjective: No acute issues or events overnight, respiratory status improving but not yet back to baseline, continues to be quite dyspneic even with short distance walking.  Otherwise denies nausea vomiting diarrhea constipation fevers chills or chest pain  Objective: Vitals:   08/15/23 1925 08/15/23 2013 08/16/23 0123 08/16/23 0548  BP: 115/77   122/80  Pulse: 83   84  Resp: 20   20  Temp: 97.8 F (36.6 C)   97.8 F (36.6 C)  TempSrc: Oral   Oral  SpO2: 97% 98% 98% 100%  Weight:      Height:        Intake/Output Summary (Last 24 hours) at 08/16/2023 0727 Last data filed at 08/16/2023 0300 Gross per 24 hour  Intake 240 ml  Output 675 ml  Net -435 ml   Filed Weights   08/12/23 2133  Weight: 67.7 kg    Examination:  General:  Pleasantly resting in bed, No acute distress. HEENT:  Normocephalic atraumatic.  Sclerae nonicteric, noninjected.  Extraocular movements intact bilaterally. Neck:  Without mass or deformity.  Trachea is midline. Lungs: Bilateral rhonchi. Heart:  Regular rate and rhythm.  Without murmurs, rubs, or gallops. Abdomen:  Soft, nontender, nondistended.  Without guarding or rebound. Extremities: Without cyanosis, clubbing, edema, or obvious deformity. Skin:  Warm and dry, no erythema.  Data Reviewed: I have personally reviewed following labs and imaging studies  CBC: Recent Labs  Lab 08/11/23 0120 08/12/23 1138 08/13/23 0503 08/14/23 0424 08/15/23 0418  WBC 7.3 15.5* 16.5* 17.6* 17.0*  NEUTROABS 5.1 13.6*  --   --   --   HGB 11.6* 11.9* 11.4* 11.3* 11.2*  HCT 37.1* 38.1* 36.7* 36.0* 37.0*  MCV 97.4 96.5 98.4 98.1 97.4  PLT 384 450* 396 374 371   Basic Metabolic Panel: Recent Labs  Lab 08/11/23 0120 08/12/23 1138 08/13/23 0503 08/14/23 0424 08/15/23 0418  NA 140 139 139 137 138  K 3.0* 3.1* 4.8 4.0 3.6  CL 103 102 106 98 94*   CO2 27 25 26 30  33*  GLUCOSE 113* 150* 127* 139* 175*  BUN 11 18 21  24* 21  CREATININE 0.84 0.72 0.82 0.80 0.62  CALCIUM  8.9 9.0 9.1 8.8* 8.8*  MG  --   --   --  1.9 2.0  PHOS  --   --   --  2.9  --    GFR: Estimated Creatinine Clearance: 71.7 mL/min (by C-G formula based on SCr of 0.62 mg/dL). Liver Function Tests: Recent Labs  Lab 08/12/23 1138 08/14/23 0424  AST 21 22  ALT 16 16  ALKPHOS 58 44  BILITOT 0.7 0.4  PROT 7.0 6.5  ALBUMIN 3.2* 2.9*   CBG: Recent Labs  Lab 08/14/23 2053 08/15/23 0750 08/15/23 1257 08/15/23 1603 08/15/23 2105  GLUCAP 141* 142* 214* 129* 197*   Sepsis Labs: Recent Labs  Lab 08/12/23 1217 08/12/23 1419  LATICACIDVEN 2.3* 2.5*    Recent Results (from the past 240 hours)  Resp panel by RT-PCR (RSV, Flu A&B, Covid) Anterior Nasal Swab     Status: None   Collection Time: 08/11/23  1:28 AM   Specimen: Anterior Nasal Swab  Result Value Ref Range Status   SARS Coronavirus 2 by RT PCR NEGATIVE NEGATIVE Final    Comment: (NOTE) SARS-CoV-2 target nucleic acids are NOT DETECTED.  The SARS-CoV-2 RNA is generally detectable in upper respiratory specimens during the acute phase of infection. The lowest concentration of SARS-CoV-2 viral copies this assay can detect is 138 copies/mL. A negative result does not preclude SARS-Cov-2 infection and should not be used as the sole basis for treatment or other patient management decisions. A negative result may occur with  improper specimen collection/handling, submission of specimen other than nasopharyngeal swab, presence of viral mutation(s) within the areas targeted by this assay, and inadequate number of viral copies(<138 copies/mL). A negative result must be combined with clinical observations, patient history, and epidemiological information. The expected result is Negative.  Fact Sheet for Patients:  bloggercourse.com  Fact Sheet for Healthcare Providers:   seriousbroker.it  This test is no t yet approved or cleared by the [place]  FDA and  has been authorized for detection and/or diagnosis of SARS-CoV-2 by FDA under an Emergency Use Authorization (EUA). This EUA will remain  in effect (meaning this test can be used) for the duration of the COVID-19 declaration under Section 564(b)(1) of the Act, 21 U.S.C.section 360bbb-3(b)(1), unless the authorization is terminated  or revoked sooner.       Influenza A by PCR NEGATIVE NEGATIVE Final   Influenza B by PCR NEGATIVE NEGATIVE Final    Comment: (NOTE) The Xpert Xpress SARS-CoV-2/FLU/RSV plus assay is intended as an aid in the diagnosis of influenza from Nasopharyngeal swab specimens and should not be used as a sole basis for treatment. Nasal washings and aspirates are unacceptable for Xpert Xpress SARS-CoV-2/FLU/RSV testing.  Fact Sheet for Patients: bloggercourse.com  Fact Sheet for Healthcare Providers: seriousbroker.it  This test is not yet approved or cleared by the [place]  FDA  and has been authorized for detection and/or diagnosis of SARS-CoV-2 by FDA under an Emergency Use Authorization (EUA). This EUA will remain in effect (meaning this test can be used) for the duration of the COVID-19 declaration under Section 564(b)(1) of the Act, 21 U.S.C. section 360bbb-3(b)(1), unless the authorization is terminated or revoked.     Resp Syncytial Virus by PCR NEGATIVE NEGATIVE Final    Comment: (NOTE) Fact Sheet for Patients: bloggercourse.com  Fact Sheet for Healthcare Providers: seriousbroker.it  This test is not yet approved or cleared by the [place]  FDA and has been authorized for detection and/or diagnosis of SARS-CoV-2 by FDA under an Emergency Use Authorization (EUA). This EUA will remain in effect (meaning this test can be used) for  the duration of the COVID-19 declaration under Section 564(b)(1) of the Act, 21 U.S.C. section 360bbb-3(b)(1), unless the authorization is terminated or revoked.  Performed at [facility], [address], [place], [place] [address]   Culture, blood (Routine x 2)     Status: None (Preliminary result)   Collection Time: 08/12/23 11:38 AM   Specimen: BLOOD RIGHT ARM  Result Value Ref Range Status   Specimen Description   Final    BLOOD RIGHT ARM Performed at [facility], [address], [place], [place] [address]    Special Requests   Final    BOTTLES DRAWN AEROBIC AND ANAEROBIC Blood Culture adequate volume Performed at [facility], [address], [place], [place] [address]    Culture   Final    NO GROWTH 4 DAYS Performed at [facility], [address], [place], [place] [address]    Report Status PENDING  Incomplete  Culture, blood (Routine x 2)     Status: Abnormal   Collection Time: 08/12/23 12:05 PM   Specimen: BLOOD LEFT ARM  Result Value Ref Range Status   Specimen Description   Final    BLOOD LEFT ARM Performed at [facility], [address] [place], [place] [address]    Special Requests   Final    BOTTLES DRAWN AEROBIC AND ANAEROBIC Blood Culture adequate volume Performed at [facility], [address] [place], [place] [address]    Culture  Setup Time   Final    GRAM POSITIVE COCCI ANAEROBIC BOTTLE ONLY CRITICAL RESULT CALLED TO, READ BACK BY AND VERIFIED WITH: [email or web address]  08/14/23 MK    Culture (A)  Final    MICROCOCCUS SPECIES micrococcus luteus Standardized susceptibility testing for this organism is not available. Performed at [facility], [address] [place], [place] [address]    Report Status 08/15/2023 FINAL  Final  Blood Culture ID Panel (Reflexed)     Status: None   Collection Time: 08/12/23 12:05 PM  Result Value Ref Range Status   Enterococcus  faecalis NOT DETECTED NOT DETECTED Final   Enterococcus Faecium NOT DETECTED NOT DETECTED Final   Listeria monocytogenes NOT DETECTED NOT DETECTED Final   Staphylococcus species NOT DETECTED NOT DETECTED Final   Staphylococcus aureus (BCID) NOT DETECTED NOT DETECTED Final   Staphylococcus epidermidis NOT DETECTED NOT DETECTED Final   Staphylococcus lugdunensis NOT DETECTED NOT DETECTED Final   Streptococcus species NOT DETECTED NOT DETECTED Final   Streptococcus agalactiae NOT DETECTED NOT DETECTED Final   Streptococcus pneumoniae NOT DETECTED NOT DETECTED Final   Streptococcus pyogenes NOT DETECTED NOT DETECTED Final   A.calcoaceticus-baumannii NOT DETECTED NOT DETECTED Final   Bacteroides fragilis  NOT DETECTED NOT DETECTED Final   Enterobacterales NOT DETECTED NOT DETECTED Final   Enterobacter cloacae complex NOT DETECTED NOT DETECTED Final   Escherichia coli NOT DETECTED NOT DETECTED Final   Klebsiella aerogenes NOT DETECTED NOT DETECTED Final   Klebsiella oxytoca NOT DETECTED NOT DETECTED Final   Klebsiella pneumoniae NOT DETECTED NOT DETECTED Final   Proteus species NOT DETECTED NOT DETECTED Final   Salmonella species NOT DETECTED NOT DETECTED Final   Serratia marcescens NOT DETECTED NOT DETECTED Final   Haemophilus influenzae NOT DETECTED NOT DETECTED Final   Neisseria meningitidis NOT DETECTED NOT DETECTED Final   Pseudomonas aeruginosa NOT DETECTED NOT DETECTED Final   Stenotrophomonas maltophilia NOT DETECTED NOT DETECTED Final   Candida albicans NOT DETECTED NOT DETECTED Final   Candida auris NOT DETECTED NOT DETECTED Final   Candida glabrata NOT DETECTED NOT DETECTED Final   Candida krusei NOT DETECTED NOT DETECTED Final   Candida parapsilosis NOT DETECTED NOT DETECTED Final   Candida tropicalis NOT DETECTED NOT DETECTED Final   Cryptococcus neoformans/gattii NOT DETECTED NOT DETECTED Final    Comment: Performed at [facility], [address], [place], [place]  [address]  Resp panel by RT-PCR (RSV, Flu A&B, Covid) Anterior Nasal Swab     Status: None   Collection Time: 08/12/23 12:54 PM   Specimen: Anterior Nasal Swab  Result Value Ref Range Status   SARS Coronavirus 2 by RT PCR NEGATIVE NEGATIVE Final    Comment: (NOTE) SARS-CoV-2 target nucleic acids are NOT DETECTED.  The SARS-CoV-2 RNA is generally detectable in upper respiratory specimens during the acute phase of infection. The lowest concentration of SARS-CoV-2 viral copies this assay can detect is 138 copies/mL. A negative result does not preclude SARS-Cov-2 infection and should not be used as the sole basis for treatment or other patient management decisions. A negative result may occur with  improper specimen collection/handling, submission of specimen other than nasopharyngeal swab, presence of viral mutation(s) within the areas targeted by this assay, and inadequate number of viral copies(<138 copies/mL). A negative result must be combined with clinical observations, patient history, and epidemiological information. The expected result is Negative.  Fact Sheet for Patients:  bloggercourse.com  Fact Sheet for Healthcare Providers:  seriousbroker.it  This test is no t yet approved or cleared by the [place]  FDA and  has been authorized for detection and/or diagnosis of SARS-CoV-2 by FDA under an Emergency Use Authorization (EUA). This EUA will remain  in effect (meaning this test can be used) for the duration of the COVID-19 declaration under Section 564(b)(1) of the Act, 21 U.S.C.section 360bbb-3(b)(1), unless the authorization is terminated  or revoked sooner.       Influenza A by PCR NEGATIVE NEGATIVE Final   Influenza B by PCR NEGATIVE NEGATIVE Final    Comment: (NOTE) The Xpert Xpress SARS-CoV-2/FLU/RSV plus assay is intended as an aid in the diagnosis of influenza from Nasopharyngeal swab specimens and should not be  used as a sole basis for treatment. Nasal washings and aspirates are unacceptable for Xpert Xpress SARS-CoV-2/FLU/RSV testing.  Fact Sheet for Patients: bloggercourse.com  Fact Sheet for Healthcare Providers: seriousbroker.it  This test is not yet approved or cleared by the [place]  FDA and has been authorized for detection and/or diagnosis of SARS-CoV-2 by FDA under an Emergency Use Authorization (EUA). This EUA will remain in effect (meaning this test can be used) for the duration of the COVID-19 declaration under Section 564(b)(1) of the Act, 21  U.S.C. section 360bbb-3(b)(1), unless the authorization is terminated or revoked.     Resp Syncytial Virus by PCR NEGATIVE NEGATIVE Final    Comment: (NOTE) Fact Sheet for Patients: bloggercourse.com  Fact Sheet for Healthcare Providers: seriousbroker.it  This test is not yet approved or cleared by the [place]  FDA and has been authorized for detection and/or diagnosis of SARS-CoV-2 by FDA under an Emergency Use Authorization (EUA). This EUA will remain in effect (meaning this test can be used) for the duration of the COVID-19 declaration under Section 564(b)(1) of the Act, 21 U.S.C. section 360bbb-3(b)(1), unless the authorization is terminated or revoked.  Performed at [facility], [address], [place], [place] [address]   Respiratory (~20 pathogens) panel by PCR     Status: Abnormal   Collection Time: 08/12/23  3:37 PM   Specimen: Nasopharyngeal Swab; Respiratory  Result Value Ref Range Status   Adenovirus NOT DETECTED NOT DETECTED Final   Coronavirus 229E NOT DETECTED NOT DETECTED Final    Comment: (NOTE) The Coronavirus on the Respiratory Panel, DOES NOT test for the novel  Coronavirus (2019 nCoV)    Coronavirus HKU1 NOT DETECTED NOT DETECTED Final   Coronavirus NL63 NOT DETECTED NOT  DETECTED Final   Coronavirus OC43 NOT DETECTED NOT DETECTED Final   Metapneumovirus NOT DETECTED NOT DETECTED Final   Rhinovirus / Enterovirus NOT DETECTED NOT DETECTED Final   Influenza A EQUIVOCAL (A) NOT DETECTED Final    Comment: Referred to [facility] in [place], [place] for serotyping.   Influenza B NOT DETECTED NOT DETECTED Final   Parainfluenza Virus 1 NOT DETECTED NOT DETECTED Final   Parainfluenza Virus 2 NOT DETECTED NOT DETECTED Final   Parainfluenza Virus 3 NOT DETECTED NOT DETECTED Final   Parainfluenza Virus 4 NOT DETECTED NOT DETECTED Final   Respiratory Syncytial Virus NOT DETECTED NOT DETECTED Final   Bordetella pertussis NOT DETECTED NOT DETECTED Final   Bordetella Parapertussis NOT DETECTED NOT DETECTED Final   Chlamydophila pneumoniae NOT DETECTED NOT DETECTED Final   Mycoplasma pneumoniae NOT DETECTED NOT DETECTED Final    Comment: Performed at [facility], [address] [place], [place] [address]  MRSA Next Gen by PCR, Nasal     Status: None   Collection Time: 08/13/23 12:13 AM   Specimen: Nasal Mucosa; Nasal Swab  Result Value Ref Range Status   MRSA by PCR Next Gen NOT DETECTED NOT DETECTED Final    Comment: (NOTE) The GeneXpert MRSA Assay (FDA approved for NASAL specimens only), is one component of a comprehensive MRSA colonization surveillance program. It is not intended to diagnose MRSA infection nor to guide or monitor treatment for MRSA infections. Test performance is not FDA approved in patients less than 30 years old. Performed at [facility], [address] [place], [place] [address]          Radiology Studies: No results found.      Scheduled Meds:  apixaban   5 mg Oral BID   arformoterol   15 mcg Nebulization BID   budesonide  (PULMICORT ) nebulizer solution  0.25 mg Nebulization BID   feeding supplement  237 mL Oral BID BM   guaiFENesin   600 mg Oral BID   insulin  aspart  0-15 Units Subcutaneous TID WC    insulin  aspart  0-5 Units Subcutaneous QHS   ipratropium  0.5 mg Nebulization Q6H   levalbuterol   0.63 mg Nebulization Q6H   methylPREDNISolone  (SOLU-MEDROL ) injection  40 mg Intravenous Q12H  metoprolol  succinate  25 mg Oral BID   pantoprazole   40 mg Oral Daily   triamcinolone   1 spray Each Nare Daily   Continuous Infusions:  azithromycin  500 mg (08/15/23 1705)   cefTRIAXone  (ROCEPHIN )  IV 1 g (08/15/23 1531)     LOS: 3 days   Time spent:  Elsie JAYSON Montclair, DO [facility]  If 7PM-7AM, please contact night-coverage [email or web address]  08/16/2023, 7:27 AM

## 2023-08-16 NOTE — Telephone Encounter (Signed)
 PCCs do not obtain prior authorizations for medications.

## 2023-08-16 NOTE — Progress Notes (Signed)
 SATURATION QUALIFICATIONS: (This note is used to comply with regulatory documentation for home oxygen )  Patient Saturations on Room Air at Rest = 98%  Patient Saturations on Room Air while Ambulating = 82%  Patient Saturations on 2 Liters of oxygen  while Ambulating = 94%  Please briefly explain why patient needs home oxygen : Patient ambulated 60 ft before oxygen  sats dropped to 82% on room air. Patient was able to deep breathe and get oxygen  up to 85% on room air. Patient was placed back on 2L [place] and oxygen  sats improved. Patient appeared short of breath during ambulation.

## 2023-08-17 ENCOUNTER — Other Ambulatory Visit (HOSPITAL_BASED_OUTPATIENT_CLINIC_OR_DEPARTMENT_OTHER): Payer: Self-pay

## 2023-08-17 ENCOUNTER — Other Ambulatory Visit (HOSPITAL_COMMUNITY): Payer: Self-pay

## 2023-08-17 DIAGNOSIS — J441 Chronic obstructive pulmonary disease with (acute) exacerbation: Secondary | ICD-10-CM | POA: Diagnosis not present

## 2023-08-17 LAB — GLUCOSE, CAPILLARY
Glucose-Capillary: 124 mg/dL — ABNORMAL HIGH (ref 70–99)
Glucose-Capillary: 266 mg/dL — ABNORMAL HIGH (ref 70–99)
Glucose-Capillary: 81 mg/dL (ref 70–99)

## 2023-08-17 LAB — CULTURE, BLOOD (ROUTINE X 2)
Culture: NO GROWTH
Special Requests: ADEQUATE

## 2023-08-17 MED ORDER — IPRATROPIUM BROMIDE 0.02 % IN SOLN
0.5000 mg | Freq: Three times a day (TID) | RESPIRATORY_TRACT | 12 refills | Status: DC
  Filled 2023-08-17: qty 150, 20d supply, fill #0
  Filled 2023-08-17: qty 75, 10d supply, fill #0
  Filled 2023-09-09: qty 150, 20d supply, fill #1

## 2023-08-17 MED ORDER — PREDNISONE 10 MG PO TABS
ORAL_TABLET | ORAL | 0 refills | Status: AC
  Filled 2023-08-17: qty 30, 12d supply, fill #0

## 2023-08-17 MED ORDER — IPRATROPIUM BROMIDE 0.02 % IN SOLN: 0.5000 mg | Freq: Three times a day (TID) | RESPIRATORY_TRACT | Status: DC

## 2023-08-17 MED ORDER — ARFORMOTEROL TARTRATE 15 MCG/2ML IN NEBU: 15.0000 ug | INHALATION_SOLUTION | Freq: Two times a day (BID) | RESPIRATORY_TRACT | 6 refills | Status: DC

## 2023-08-17 MED ORDER — LEVALBUTEROL HCL 0.63 MG/3ML IN NEBU: 0.6300 mg | INHALATION_SOLUTION | Freq: Three times a day (TID) | RESPIRATORY_TRACT | Status: DC

## 2023-08-17 MED ORDER — IPRATROPIUM BROMIDE 0.02 % IN SOLN
0.5000 mg | Freq: Three times a day (TID) | RESPIRATORY_TRACT | 0 refills | Status: DC
  Filled 2023-08-17: qty 150, 20d supply, fill #0
  Filled 2023-08-17: qty 225, 25d supply, fill #0

## 2023-08-17 MED ORDER — BUDESONIDE 0.25 MG/2ML IN SUSP: 0.2500 mg | Freq: Two times a day (BID) | RESPIRATORY_TRACT | 6 refills | Status: DC

## 2023-08-17 MED ORDER — NYSTATIN 100000 UNIT/ML MT SUSP
5.0000 mL | Freq: Four times a day (QID) | OROMUCOSAL | 0 refills | Status: DC
  Filled 2023-08-17: qty 60, 3d supply, fill #0

## 2023-08-17 MED ORDER — BUDESONIDE-FORMOTEROL FUMARATE 160-4.5 MCG/ACT IN AERO
2.0000 | INHALATION_SPRAY | Freq: Two times a day (BID) | RESPIRATORY_TRACT | 0 refills | Status: DC
  Filled 2023-08-17: qty 10.2, 30d supply, fill #0

## 2023-08-17 MED ORDER — TIOTROPIUM BROMIDE MONOHYDRATE 18 MCG IN CAPS
18.0000 ug | ORAL_CAPSULE | Freq: Every day | RESPIRATORY_TRACT | 0 refills | Status: DC
  Filled 2023-08-17 (×2): qty 30, 30d supply, fill #0

## 2023-08-17 NOTE — Plan of Care (Signed)
   Problem: Education: Goal: Ability to describe self-care measures that may prevent or decrease complications (Diabetes Survival Skills Education) will improve Outcome: Progressing Goal: Individualized Educational Video(s) Outcome: Progressing   Problem: Coping: Goal: Ability to adjust to condition or change in health will improve Outcome: Progressing

## 2023-08-17 NOTE — Progress Notes (Signed)
 SATURATION QUALIFICATIONS: (This note is used to comply with regulatory documentation for home oxygen )  Patient Saturations on Room Air at Rest = 90%  Patient Saturations on Room Air while Ambulating = 80%  Patient Saturations on 3 Liters of oxygen  while Ambulating = 94%  Please briefly explain why patient needs home oxygen : to maintain appropriate SpO2 levels.   Sylvan Nest Kistler PT 08/17/2023  Acute Rehabilitation Services  Office [phone]

## 2023-08-17 NOTE — Telephone Encounter (Signed)
 Pharmacy states arformoterol  and budesonide  will need PA.

## 2023-08-17 NOTE — Discharge Summary (Signed)
 Physician Discharge Summary  Matthew Hall FMW:[id number] DOB: 1945-11-08 DOA: 08/12/2023  PCP: Matthew Senior, MD  Admit date: 08/12/2023 Discharge date: 08/17/2023  Admitted From: Matthew Hall Disposition:  Same  Recommendations for Outpatient Follow-up:  Follow up with PCP/pulmonology as discussed in 1-2 weeks  Home Health: None Equipment/Devices: Oxygen   Discharge Condition: Stable CODE STATUS: Full Diet recommendation: Low-salt low-fat low-carb diet, fluid restrict  Brief/Interim Summary: Matthew Hall is a pleasant 78 year old male with history of PAF on anticoagulation, chronic systolic CHF, HTN, chronic bronchiectasis who returns to the hospital with acute hypoxic respiratory failure, concern for COPD exacerbation.  Patient admitted as above with acute hypoxic respiratory failure in the setting of possible influenza infection and COPD exacerbation.  He is now improving drastically, feels quite well but continues to be hypoxic with ambulation.  At this time he is otherwise stable and agreeable for discharge back to facility on 3 L nasal cannula only with exertion, while on room air at rest he does not desaturate.  Notable issue with insurance coverage for nebulized medications, attempting to fill Symbicort  instead of arformoterol  and budesonide  separately as nebulized medications.  Otherwise complete steroid taper as discussed no further need for antibiotics or antivirals.  Discussed following up with pulmonology for sleep study as well.   Discharge Diagnoses:  Principal Problem:   COPD with acute exacerbation (HCC) Active Problems:   COPD exacerbation (HCC)  Acute hypoxic respiratory failure due to COPD exacerbation, ongoing Rule out influenza infection -Continue supportive care, steroids, nebulizer treatments, Pulmicort  -Continue supplemental oxygen  -3 L nasal cannula with exertion only -Patient reports need for sleep study which he states is difficult to be  scheduled outpatient -Influenza test equivocal/indeterminate -no indication for treatment at this point given his improvement - Continue precautions in the interim   Chronic systolic CHF -Echo 2024 shows EF 40 to 45% -Not currently in exacerbation -Resume home medications, appears euvolemic   COPD, bronchiectasis, prior MAC Questionable sleep apnea -Follows with outpatient pulmonology, discussed follow-up after discharge -Patient indicates he is having difficulty getting scheduled for sleep apnea testing, defer to outpatient pulmonology versus PCP   Anxiety - Xanax  as needed per home regimen Elevated troponin -likely demand ischemia due to acute illness.  No chest pain PAF - currently rate controlled A-fib, continue metoprolol  and Eliquis  anticoagulation DM2 - resume metformin   Discharge Instructions  Discharge Instructions     Ambulatory Referral for Lung Cancer Scre   Complete by: As directed    Call MD for:  difficulty breathing, headache or visual disturbances   Complete by: As directed    Call MD for:  extreme fatigue   Complete by: As directed    Call MD for:  persistant dizziness or light-headedness   Complete by: As directed    Call MD for:  persistant nausea and vomiting   Complete by: As directed    Call MD for:  severe uncontrolled pain   Complete by: As directed    Call MD for:  temperature >100.4   Complete by: As directed    Diet - low sodium heart healthy   Complete by: As directed    Increase activity slowly   Complete by: As directed       Allergies as of 08/17/2023       Reactions   Simvastatin Rash   Cortisone Nausea And Vomiting   Albuterol  Palpitations, Other (See Comments)   Pt is okay to take xopenex , albuterol  raises his heart rate   Augmentin [  amoxicillin-pot Clavulanate] Rash   Ciprofloxacin Rash   Contrast Media [iodinated Contrast Media] Rash   Flagyl  [metronidazole ] Rash   Latex Hives, Rash   Sulfa  Antibiotics Rash         Medication List     STOP taking these medications    benzonatate  100 MG capsule Commonly known as: TESSALON    BION TEARS OP   budesonide  0.25 MG/2ML nebulizer solution Commonly known as: PULMICORT    levalbuterol  45 MCG/ACT inhaler Commonly known as: Xopenex  HFA       TAKE these medications    ALPRAZolam  0.25 MG tablet Commonly known as: XANAX  Take 0.25 mg by mouth 2 (two) times daily as needed for anxiety or sleep.   apixaban  5 MG Tabs tablet Commonly known as: ELIQUIS  Take 1 tablet (5 mg total) by mouth 2 (two) times daily.   budesonide -formoterol  160-4.5 MCG/ACT inhaler Commonly known as: Symbicort  Inhale 2 puffs into the lungs 2 (two) times daily.   ELDERBERRY PO Take 1 tablet by mouth daily.   esomeprazole  20 MG capsule Commonly known as: NEXIUM  Take 20 mg by mouth daily at 12 noon.   feeding supplement Liqd Take 237 mLs by mouth 2 (two) times daily between meals.   guaiFENesin  600 MG 12 hr tablet Commonly known as: MUCINEX  Take 1 tablet (600 mg total) by mouth 2 (two) times daily.   ipratropium 0.02 % nebulizer solution Commonly known as: ATROVENT  Take 2.5 mLs (0.5 mg total) by nebulization 3 (three) times daily.   levalbuterol  0.63 MG/3ML nebulizer solution Commonly known as: Xopenex  Take 3 mLs (0.63 mg total) by nebulization every 6 (six) hours as needed for wheezing or shortness of breath.   metoprolol  succinate 25 MG 24 hr tablet Commonly known as: TOPROL -XL TAKE 1 TABLET BY MOUTH TWICE A DAY   nystatin  100000 UNIT/ML suspension Commonly known as: MYCOSTATIN  Take 5 mLs (500,000 Units total) by mouth 4 (four) times daily.   predniSONE  10 MG tablet Commonly known as: DELTASONE  Take 4 tablets (40 mg total) by mouth daily for 3 days, THEN 3 tablets (30 mg total) daily for 3 days, THEN 2 tablets (20 mg total) daily for 3 days, THEN 1 tablet (10 mg total) daily for 3 days. Start taking on: August 17, 2023   sodium chloride  HYPERTONIC 3 %  nebulizer solution Take 4 mLs by nebulization 2 (two) times daily. What changed: additional instructions   triamcinolone  55 MCG/ACT Aero nasal inhaler Commonly known as: NASACORT  Place 1 spray into the nose daily.   Tylenol  325 MG Caps Generic drug: Acetaminophen  Take 1-2 capsules by mouth every 6 (six) hours as needed (pain).   VITAMIN B12 PO Take 1 tablet by mouth daily.               Durable Medical Equipment  (From admission, onward)           Start     Ordered   08/17/23 1424  For home use only DME oxygen   Once       Question Answer Comment  Length of Need Lifetime   Mode or (Route) Nasal cannula   Liters per Minute 3   Oxygen  delivery system Gas      08/17/23 1423            Allergies  Allergen Reactions   Simvastatin Rash   Cortisone Nausea And Vomiting   Albuterol  Palpitations and Other (See Comments)    Pt is okay to take xopenex , albuterol  raises his heart rate  Augmentin [Amoxicillin-Pot Clavulanate] Rash   Ciprofloxacin Rash   Contrast Media [Iodinated Contrast Media] Rash   Flagyl  [Metronidazole ] Rash   Latex Hives and Rash   Sulfa  Antibiotics Rash    Consultations: None  Procedures/Studies: DG Chest 2 View Result Date: 08/12/2023 CLINICAL DATA:  Shortness of breath and weakness.  Cough. EXAM: CHEST - 2 VIEW COMPARISON:  08/11/2023 and 06/17/2023. FINDINGS: Heart is enlarged, stable. Biapical pleuroparenchymal scarring, right greater than left. Scattered micronodularity and streaky densities, right greater than left, corresponding to CT chest 06/17/2023 no definite superimposed airspace consolidation no pleural fluid. Osteopenia. Degenerative changes in the spine. IMPRESSION: 1. No definite acute findings. 2. Chronic mycobacterium avium complex. Electronically Signed   By: Newell Eke M.D.   On: 08/12/2023 12:54   DG Chest Port 1 View Result Date: 08/11/2023 CLINICAL DATA:  Dyspnea EXAM: PORTABLE CHEST 1 VIEW COMPARISON:  07/12/2023  FINDINGS: The lungs are stably mildly hyperinflated in keeping with changes of underlying COPD. Stable asymmetric right apical pleural thickening. No focal pulmonary infiltrate. No pneumothorax or pleural effusion. Cardiac size within normal limits. Pulmonary vascularity is normal. No acute bone abnormality. IMPRESSION: 1. No active disease. COPD. Electronically Signed   By: Dorethia Molt M.D.   On: 08/11/2023 01:51     Subjective: No acute issues or events overnight, respiratory status continues to improve drastically   Discharge Exam: Vitals:   08/17/23 1256 08/17/23 1425  BP: 128/68   Pulse: 91   Resp:    Temp: (!) 97.5 F (36.4 C)   SpO2: 92% 97%   Vitals:   08/17/23 0825 08/17/23 1011 08/17/23 1256 08/17/23 1425  BP:  111/68 128/68   Pulse:  88 91   Resp:      Temp:   (!) 97.5 F (36.4 C)   TempSrc:   Axillary   SpO2: 99%  92% 97%  Weight:      Height:        General: Pt is alert, awake, not in acute distress Cardiovascular: RRR, S1/S2 +, no rubs, no gallops Respiratory: CTA bilaterally, no wheezing, no rhonchi Abdominal: Soft, NT, ND, bowel sounds + Extremities: no edema, no cyanosis    The results of significant diagnostics from this hospitalization (including imaging, microbiology, ancillary and laboratory) are listed below for reference.     Microbiology: Recent Results (from the past 240 hours)  Resp panel by RT-PCR (RSV, Flu A&B, Covid) Anterior Nasal Swab     Status: None   Collection Time: 08/11/23  1:28 AM   Specimen: Anterior Nasal Swab  Result Value Ref Range Status   SARS Coronavirus 2 by RT PCR NEGATIVE NEGATIVE Final    Comment: (NOTE) SARS-CoV-2 target nucleic acids are NOT DETECTED.  The SARS-CoV-2 RNA is generally detectable in upper respiratory specimens during the acute phase of infection. The lowest concentration of SARS-CoV-2 viral copies this assay can detect is 138 copies/mL. A negative result does not preclude SARS-Cov-2 infection  and should not be used as the sole basis for treatment or other patient management decisions. A negative result may occur with  improper specimen collection/handling, submission of specimen other than nasopharyngeal swab, presence of viral mutation(s) within the areas targeted by this assay, and inadequate number of viral copies(<138 copies/mL). A negative result must be combined with clinical observations, patient history, and epidemiological information. The expected result is Negative.  Fact Sheet for Patients:  bloggercourse.com  Fact Sheet for Healthcare Providers:  seriousbroker.it  This test is no t yet approved  or cleared by the [place]  FDA and  has been authorized for detection and/or diagnosis of SARS-CoV-2 by FDA under an Emergency Use Authorization (EUA). This EUA will remain  in effect (meaning this test can be used) for the duration of the COVID-19 declaration under Section 564(b)(1) of the Act, 21 U.S.C.section 360bbb-3(b)(1), unless the authorization is terminated  or revoked sooner.       Influenza A by PCR NEGATIVE NEGATIVE Final   Influenza B by PCR NEGATIVE NEGATIVE Final    Comment: (NOTE) The Xpert Xpress SARS-CoV-2/FLU/RSV plus assay is intended as an aid in the diagnosis of influenza from Nasopharyngeal swab specimens and should not be used as a sole basis for treatment. Nasal washings and aspirates are unacceptable for Xpert Xpress SARS-CoV-2/FLU/RSV testing.  Fact Sheet for Patients: bloggercourse.com  Fact Sheet for Healthcare Providers: seriousbroker.it  This test is not yet approved or cleared by the [place]  FDA and has been authorized for detection and/or diagnosis of SARS-CoV-2 by FDA under an Emergency Use Authorization (EUA). This EUA will remain in effect (meaning this test can be used) for the duration of the COVID-19 declaration  under Section 564(b)(1) of the Act, 21 U.S.C. section 360bbb-3(b)(1), unless the authorization is terminated or revoked.     Resp Syncytial Virus by PCR NEGATIVE NEGATIVE Final    Comment: (NOTE) Fact Sheet for Patients: bloggercourse.com  Fact Sheet for Healthcare Providers: seriousbroker.it  This test is not yet approved or cleared by the [place]  FDA and has been authorized for detection and/or diagnosis of SARS-CoV-2 by FDA under an Emergency Use Authorization (EUA). This EUA will remain in effect (meaning this test can be used) for the duration of the COVID-19 declaration under Section 564(b)(1) of the Act, 21 U.S.C. section 360bbb-3(b)(1), unless the authorization is terminated or revoked.  Performed at [facility], [address], [place], [place] [address]   Culture, blood (Routine x 2)     Status: None   Collection Time: 08/12/23 11:38 AM   Specimen: BLOOD RIGHT ARM  Result Value Ref Range Status   Specimen Description   Final    BLOOD RIGHT ARM Performed at [facility], [address] [place], [place] [address]    Special Requests   Final    BOTTLES DRAWN AEROBIC AND ANAEROBIC Blood Culture adequate volume Performed at [facility], [address], [place], [place] [address]    Culture   Final    NO GROWTH 5 DAYS Performed at [facility], [address] [place], [place] [address]    Report Status 08/17/2023 FINAL  Final  Culture, blood (Routine x 2)     Status: Abnormal   Collection Time: 08/12/23 12:05 PM   Specimen: BLOOD LEFT ARM  Result Value Ref Range Status   Specimen Description   Final    BLOOD LEFT ARM Performed at [facility], [address], [place], [place] [address]    Special Requests   Final    BOTTLES DRAWN AEROBIC AND ANAEROBIC Blood Culture adequate volume Performed at [facility], [address],  [place], [place] [address]    Culture  Setup Time   Final    GRAM POSITIVE COCCI ANAEROBIC BOTTLE ONLY CRITICAL RESULT CALLED TO, READ BACK BY AND VERIFIED WITH: [email or web address]  08/14/23 MK    Culture (A)  Final    MICROCOCCUS SPECIES micrococcus luteus Standardized susceptibility testing for this organism is not available.  Performed at [facility], [address] [place], [place] [address]    Report Status 08/15/2023 FINAL  Final  Blood Culture ID Panel (Reflexed)     Status: None   Collection Time: 08/12/23 12:05 PM  Result Value Ref Range Status   Enterococcus faecalis NOT DETECTED NOT DETECTED Final   Enterococcus Faecium NOT DETECTED NOT DETECTED Final   Listeria monocytogenes NOT DETECTED NOT DETECTED Final   Staphylococcus species NOT DETECTED NOT DETECTED Final   Staphylococcus aureus (BCID) NOT DETECTED NOT DETECTED Final   Staphylococcus epidermidis NOT DETECTED NOT DETECTED Final   Staphylococcus lugdunensis NOT DETECTED NOT DETECTED Final   Streptococcus species NOT DETECTED NOT DETECTED Final   Streptococcus agalactiae NOT DETECTED NOT DETECTED Final   Streptococcus pneumoniae NOT DETECTED NOT DETECTED Final   Streptococcus pyogenes NOT DETECTED NOT DETECTED Final   A.calcoaceticus-baumannii NOT DETECTED NOT DETECTED Final   Bacteroides fragilis NOT DETECTED NOT DETECTED Final   Enterobacterales NOT DETECTED NOT DETECTED Final   Enterobacter cloacae complex NOT DETECTED NOT DETECTED Final   Escherichia coli NOT DETECTED NOT DETECTED Final   Klebsiella aerogenes NOT DETECTED NOT DETECTED Final   Klebsiella oxytoca NOT DETECTED NOT DETECTED Final   Klebsiella pneumoniae NOT DETECTED NOT DETECTED Final   Proteus species NOT DETECTED NOT DETECTED Final   Salmonella species NOT DETECTED NOT DETECTED Final   Serratia marcescens NOT DETECTED NOT DETECTED Final   Haemophilus influenzae NOT DETECTED NOT DETECTED Final   Neisseria meningitidis NOT DETECTED NOT  DETECTED Final   Pseudomonas aeruginosa NOT DETECTED NOT DETECTED Final   Stenotrophomonas maltophilia NOT DETECTED NOT DETECTED Final   Candida albicans NOT DETECTED NOT DETECTED Final   Candida auris NOT DETECTED NOT DETECTED Final   Candida glabrata NOT DETECTED NOT DETECTED Final   Candida krusei NOT DETECTED NOT DETECTED Final   Candida parapsilosis NOT DETECTED NOT DETECTED Final   Candida tropicalis NOT DETECTED NOT DETECTED Final   Cryptococcus neoformans/gattii NOT DETECTED NOT DETECTED Final    Comment: Performed at [facility], [address], [place], [place] [address]  Resp panel by RT-PCR (RSV, Flu A&B, Covid) Anterior Nasal Swab     Status: None   Collection Time: 08/12/23 12:54 PM   Specimen: Anterior Nasal Swab  Result Value Ref Range Status   SARS Coronavirus 2 by RT PCR NEGATIVE NEGATIVE Final    Comment: (NOTE) SARS-CoV-2 target nucleic acids are NOT DETECTED.  The SARS-CoV-2 RNA is generally detectable in upper respiratory specimens during the acute phase of infection. The lowest concentration of SARS-CoV-2 viral copies this assay can detect is 138 copies/mL. A negative result does not preclude SARS-Cov-2 infection and should not be used as the sole basis for treatment or other patient management decisions. A negative result may occur with  improper specimen collection/handling, submission of specimen other than nasopharyngeal swab, presence of viral mutation(s) within the areas targeted by this assay, and inadequate number of viral copies(<138 copies/mL). A negative result must be combined with clinical observations, patient history, and epidemiological information. The expected result is Negative.  Fact Sheet for Patients:  bloggercourse.com  Fact Sheet for Healthcare Providers:  seriousbroker.it  This test is no t yet approved or cleared by the [place]  FDA and  has been authorized for  detection and/or diagnosis of SARS-CoV-2 by FDA under an Emergency Use Authorization (EUA). This EUA will remain  in effect (meaning this test can be used) for the duration of the COVID-19  declaration under Section 564(b)(1) of the Act, 21 U.S.C.section 360bbb-3(b)(1), unless the authorization is terminated  or revoked sooner.       Influenza A by PCR NEGATIVE NEGATIVE Final   Influenza B by PCR NEGATIVE NEGATIVE Final    Comment: (NOTE) The Xpert Xpress SARS-CoV-2/FLU/RSV plus assay is intended as an aid in the diagnosis of influenza from Nasopharyngeal swab specimens and should not be used as a sole basis for treatment. Nasal washings and aspirates are unacceptable for Xpert Xpress SARS-CoV-2/FLU/RSV testing.  Fact Sheet for Patients: bloggercourse.com  Fact Sheet for Healthcare Providers: seriousbroker.it  This test is not yet approved or cleared by the [place]  FDA and has been authorized for detection and/or diagnosis of SARS-CoV-2 by FDA under an Emergency Use Authorization (EUA). This EUA will remain in effect (meaning this test can be used) for the duration of the COVID-19 declaration under Section 564(b)(1) of the Act, 21 U.S.C. section 360bbb-3(b)(1), unless the authorization is terminated or revoked.     Resp Syncytial Virus by PCR NEGATIVE NEGATIVE Final    Comment: (NOTE) Fact Sheet for Patients: bloggercourse.com  Fact Sheet for Healthcare Providers: seriousbroker.it  This test is not yet approved or cleared by the [place]  FDA and has been authorized for detection and/or diagnosis of SARS-CoV-2 by FDA under an Emergency Use Authorization (EUA). This EUA will remain in effect (meaning this test can be used) for the duration of the COVID-19 declaration under Section 564(b)(1) of the Act, 21 U.S.C. section 360bbb-3(b)(1), unless the authorization is  terminated or revoked.  Performed at [facility], [address] [place], [place] [address]   Respiratory (~20 pathogens) panel by PCR     Status: Abnormal   Collection Time: 08/12/23  3:37 PM   Specimen: Nasopharyngeal Swab; Respiratory  Result Value Ref Range Status   Adenovirus NOT DETECTED NOT DETECTED Final   Coronavirus 229E NOT DETECTED NOT DETECTED Final    Comment: (NOTE) The Coronavirus on the Respiratory Panel, DOES NOT test for the novel  Coronavirus (2019 nCoV)    Coronavirus HKU1 NOT DETECTED NOT DETECTED Final   Coronavirus NL63 NOT DETECTED NOT DETECTED Final   Coronavirus OC43 NOT DETECTED NOT DETECTED Final   Metapneumovirus NOT DETECTED NOT DETECTED Final   Rhinovirus / Enterovirus NOT DETECTED NOT DETECTED Final   Influenza A EQUIVOCAL (A) NOT DETECTED Final    Comment: Referred to [facility] in [place], [place] for serotyping.   Influenza B NOT DETECTED NOT DETECTED Final   Parainfluenza Virus 1 NOT DETECTED NOT DETECTED Final   Parainfluenza Virus 2 NOT DETECTED NOT DETECTED Final   Parainfluenza Virus 3 NOT DETECTED NOT DETECTED Final   Parainfluenza Virus 4 NOT DETECTED NOT DETECTED Final   Respiratory Syncytial Virus NOT DETECTED NOT DETECTED Final   Bordetella pertussis NOT DETECTED NOT DETECTED Final   Bordetella Parapertussis NOT DETECTED NOT DETECTED Final   Chlamydophila pneumoniae NOT DETECTED NOT DETECTED Final   Mycoplasma pneumoniae NOT DETECTED NOT DETECTED Final    Comment: Performed at [facility], [address], [place], [place] [address]  MRSA Next Gen by PCR, Nasal     Status: None   Collection Time: 08/13/23 12:13 AM   Specimen: Nasal Mucosa; Nasal Swab  Result Value Ref Range Status   MRSA by PCR Next Gen NOT DETECTED NOT DETECTED Final    Comment: (NOTE) The GeneXpert MRSA Assay (FDA approved for NASAL specimens only), is one component of a comprehensive  MRSA colonization surveillance program. It is  not intended to diagnose MRSA infection nor to guide or monitor treatment for MRSA infections. Test performance is not FDA approved in patients less than 21 years old. Performed at [facility], [address], [place], [place] [address]      Labs: BNP (last 3 results) Recent Labs    07/13/23 0533 08/11/23 0120 08/12/23 1138  BNP 117.8* 333.0* 222.1*   Basic Metabolic Panel: Recent Labs  Lab 08/11/23 0120 08/12/23 1138 08/13/23 0503 08/14/23 0424 08/15/23 0418  NA 140 139 139 137 138  K 3.0* 3.1* 4.8 4.0 3.6  CL 103 102 106 98 94*  CO2 27 25 26 30  33*  GLUCOSE 113* 150* 127* 139* 175*  BUN 11 18 21  24* 21  CREATININE 0.84 0.72 0.82 0.80 0.62  CALCIUM  8.9 9.0 9.1 8.8* 8.8*  MG  --   --   --  1.9 2.0  PHOS  --   --   --  2.9  --    Liver Function Tests: Recent Labs  Lab 08/12/23 1138 08/14/23 0424  AST 21 22  ALT 16 16  ALKPHOS 58 44  BILITOT 0.7 0.4  PROT 7.0 6.5  ALBUMIN 3.2* 2.9*   No results for input(s): LIPASE, AMYLASE in the last 168 hours. No results for input(s): AMMONIA in the last 168 hours. CBC: Recent Labs  Lab 08/11/23 0120 08/12/23 1138 08/13/23 0503 08/14/23 0424 08/15/23 0418  WBC 7.3 15.5* 16.5* 17.6* 17.0*  NEUTROABS 5.1 13.6*  --   --   --   HGB 11.6* 11.9* 11.4* 11.3* 11.2*  HCT 37.1* 38.1* 36.7* 36.0* 37.0*  MCV 97.4 96.5 98.4 98.1 97.4  PLT 384 450* 396 374 371   Cardiac Enzymes: No results for input(s): CKTOTAL, CKMB, CKMBINDEX, TROPONINI in the last 168 hours. BNP: Invalid input(s): POCBNP CBG: Recent Labs  Lab 08/16/23 1218 08/16/23 1648 08/16/23 2034 08/17/23 0805 08/17/23 1224  GLUCAP 151* 207* 141* 124* 266*   D-Dimer No results for input(s): DDIMER in the last 72 hours. Hgb A1c No results for input(s): HGBA1C in the last 72 hours. Lipid Profile No results for input(s): CHOL, HDL, LDLCALC, TRIG, CHOLHDL, LDLDIRECT in the last 72 hours. Thyroid  function  studies No results for input(s): TSH, T4TOTAL, T3FREE, THYROIDAB in the last 72 hours.  Invalid input(s): FREET3 Anemia work up No results for input(s): VITAMINB12, FOLATE, FERRITIN, TIBC, IRON , RETICCTPCT in the last 72 hours. Urinalysis    Component Value Date/Time   COLORURINE YELLOW 08/12/2023 1209   APPEARANCEUR CLEAR 08/12/2023 1209   LABSPEC 1.017 08/12/2023 1209   PHURINE 5.0 08/12/2023 1209   GLUCOSEU NEGATIVE 08/12/2023 1209   HGBUR SMALL (A) 08/12/2023 1209   BILIRUBINUR NEGATIVE 08/12/2023 1209   KETONESUR NEGATIVE 08/12/2023 1209   PROTEINUR NEGATIVE 08/12/2023 1209   NITRITE NEGATIVE 08/12/2023 1209   LEUKOCYTESUR NEGATIVE 08/12/2023 1209   Sepsis Labs Recent Labs  Lab 08/12/23 1138 08/13/23 0503 08/14/23 0424 08/15/23 0418  WBC 15.5* 16.5* 17.6* 17.0*   Microbiology Recent Results (from the past 240 hours)  Resp panel by RT-PCR (RSV, Flu A&B, Covid) Anterior Nasal Swab     Status: None   Collection Time: 08/11/23  1:28 AM   Specimen: Anterior Nasal Swab  Result Value Ref Range Status   SARS Coronavirus 2 by RT PCR NEGATIVE NEGATIVE Final    Comment: (NOTE) SARS-CoV-2 target nucleic acids are NOT DETECTED.  The SARS-CoV-2 RNA is generally detectable in upper  respiratory specimens during the acute phase of infection. The lowest concentration of SARS-CoV-2 viral copies this assay can detect is 138 copies/mL. A negative result does not preclude SARS-Cov-2 infection and should not be used as the sole basis for treatment or other patient management decisions. A negative result may occur with  improper specimen collection/handling, submission of specimen other than nasopharyngeal swab, presence of viral mutation(s) within the areas targeted by this assay, and inadequate number of viral copies(<138 copies/mL). A negative result must be combined with clinical observations, patient history, and epidemiological information. The expected  result is Negative.  Fact Sheet for Patients:  bloggercourse.com  Fact Sheet for Healthcare Providers:  seriousbroker.it  This test is no t yet approved or cleared by the [place]  FDA and  has been authorized for detection and/or diagnosis of SARS-CoV-2 by FDA under an Emergency Use Authorization (EUA). This EUA will remain  in effect (meaning this test can be used) for the duration of the COVID-19 declaration under Section 564(b)(1) of the Act, 21 U.S.C.section 360bbb-3(b)(1), unless the authorization is terminated  or revoked sooner.       Influenza A by PCR NEGATIVE NEGATIVE Final   Influenza B by PCR NEGATIVE NEGATIVE Final    Comment: (NOTE) The Xpert Xpress SARS-CoV-2/FLU/RSV plus assay is intended as an aid in the diagnosis of influenza from Nasopharyngeal swab specimens and should not be used as a sole basis for treatment. Nasal washings and aspirates are unacceptable for Xpert Xpress SARS-CoV-2/FLU/RSV testing.  Fact Sheet for Patients: bloggercourse.com  Fact Sheet for Healthcare Providers: seriousbroker.it  This test is not yet approved or cleared by the [place]  FDA and has been authorized for detection and/or diagnosis of SARS-CoV-2 by FDA under an Emergency Use Authorization (EUA). This EUA will remain in effect (meaning this test can be used) for the duration of the COVID-19 declaration under Section 564(b)(1) of the Act, 21 U.S.C. section 360bbb-3(b)(1), unless the authorization is terminated or revoked.     Resp Syncytial Virus by PCR NEGATIVE NEGATIVE Final    Comment: (NOTE) Fact Sheet for Patients: bloggercourse.com  Fact Sheet for Healthcare Providers: seriousbroker.it  This test is not yet approved or cleared by the [place]  FDA and has been authorized for detection and/or diagnosis of  SARS-CoV-2 by FDA under an Emergency Use Authorization (EUA). This EUA will remain in effect (meaning this test can be used) for the duration of the COVID-19 declaration under Section 564(b)(1) of the Act, 21 U.S.C. section 360bbb-3(b)(1), unless the authorization is terminated or revoked.  Performed at [facility], [address], [place], [place] [address]   Culture, blood (Routine x 2)     Status: None   Collection Time: 08/12/23 11:38 AM   Specimen: BLOOD RIGHT ARM  Result Value Ref Range Status   Specimen Description   Final    BLOOD RIGHT ARM Performed at [facility], [address] [place], [place] [address]    Special Requests   Final    BOTTLES DRAWN AEROBIC AND ANAEROBIC Blood Culture adequate volume Performed at [facility], [address], [place], [place] [address]    Culture   Final    NO GROWTH 5 DAYS Performed at [facility], [address], [place], [place] [address]    Report Status 08/17/2023 FINAL  Final  Culture, blood (Routine x 2)     Status: Abnormal   Collection Time: 08/12/23 12:05 PM   Specimen: BLOOD  LEFT ARM  Result Value Ref Range Status   Specimen Description   Final    BLOOD LEFT ARM Performed at [facility], [address] [place], [place] [address]    Special Requests   Final    BOTTLES DRAWN AEROBIC AND ANAEROBIC Blood Culture adequate volume Performed at [facility], [address] [place], [place] [address]    Culture  Setup Time   Final    GRAM POSITIVE COCCI ANAEROBIC BOTTLE ONLY CRITICAL RESULT CALLED TO, READ BACK BY AND VERIFIED WITH: [email or web address]  08/14/23 MK    Culture (A)  Final    MICROCOCCUS SPECIES micrococcus luteus Standardized susceptibility testing for this organism is not available. Performed at [facility], [address] [place], [place] [address]    Report Status 08/15/2023 FINAL  Final  Blood Culture ID Panel  (Reflexed)     Status: None   Collection Time: 08/12/23 12:05 PM  Result Value Ref Range Status   Enterococcus faecalis NOT DETECTED NOT DETECTED Final   Enterococcus Faecium NOT DETECTED NOT DETECTED Final   Listeria monocytogenes NOT DETECTED NOT DETECTED Final   Staphylococcus species NOT DETECTED NOT DETECTED Final   Staphylococcus aureus (BCID) NOT DETECTED NOT DETECTED Final   Staphylococcus epidermidis NOT DETECTED NOT DETECTED Final   Staphylococcus lugdunensis NOT DETECTED NOT DETECTED Final   Streptococcus species NOT DETECTED NOT DETECTED Final   Streptococcus agalactiae NOT DETECTED NOT DETECTED Final   Streptococcus pneumoniae NOT DETECTED NOT DETECTED Final   Streptococcus pyogenes NOT DETECTED NOT DETECTED Final   A.calcoaceticus-baumannii NOT DETECTED NOT DETECTED Final   Bacteroides fragilis NOT DETECTED NOT DETECTED Final   Enterobacterales NOT DETECTED NOT DETECTED Final   Enterobacter cloacae complex NOT DETECTED NOT DETECTED Final   Escherichia coli NOT DETECTED NOT DETECTED Final   Klebsiella aerogenes NOT DETECTED NOT DETECTED Final   Klebsiella oxytoca NOT DETECTED NOT DETECTED Final   Klebsiella pneumoniae NOT DETECTED NOT DETECTED Final   Proteus species NOT DETECTED NOT DETECTED Final   Salmonella species NOT DETECTED NOT DETECTED Final   Serratia marcescens NOT DETECTED NOT DETECTED Final   Haemophilus influenzae NOT DETECTED NOT DETECTED Final   Neisseria meningitidis NOT DETECTED NOT DETECTED Final   Pseudomonas aeruginosa NOT DETECTED NOT DETECTED Final   Stenotrophomonas maltophilia NOT DETECTED NOT DETECTED Final   Candida albicans NOT DETECTED NOT DETECTED Final   Candida auris NOT DETECTED NOT DETECTED Final   Candida glabrata NOT DETECTED NOT DETECTED Final   Candida krusei NOT DETECTED NOT DETECTED Final   Candida parapsilosis NOT DETECTED NOT DETECTED Final   Candida tropicalis NOT DETECTED NOT DETECTED Final   Cryptococcus neoformans/gattii  NOT DETECTED NOT DETECTED Final    Comment: Performed at [facility], [address] [place], [place] [address]  Resp panel by RT-PCR (RSV, Flu A&B, Covid) Anterior Nasal Swab     Status: None   Collection Time: 08/12/23 12:54 PM   Specimen: Anterior Nasal Swab  Result Value Ref Range Status   SARS Coronavirus 2 by RT PCR NEGATIVE NEGATIVE Final    Comment: (NOTE) SARS-CoV-2 target nucleic acids are NOT DETECTED.  The SARS-CoV-2 RNA is generally detectable in upper respiratory specimens during the acute phase of infection. The lowest concentration of SARS-CoV-2 viral copies this assay can detect is 138 copies/mL. A negative result does not preclude SARS-Cov-2 infection and should not be used as the sole basis for treatment or other patient management  decisions. A negative result may occur with  improper specimen collection/handling, submission of specimen other than nasopharyngeal swab, presence of viral mutation(s) within the areas targeted by this assay, and inadequate number of viral copies(<138 copies/mL). A negative result must be combined with clinical observations, patient history, and epidemiological information. The expected result is Negative.  Fact Sheet for Patients:  bloggercourse.com  Fact Sheet for Healthcare Providers:  seriousbroker.it  This test is no t yet approved or cleared by the [place]  FDA and  has been authorized for detection and/or diagnosis of SARS-CoV-2 by FDA under an Emergency Use Authorization (EUA). This EUA will remain  in effect (meaning this test can be used) for the duration of the COVID-19 declaration under Section 564(b)(1) of the Act, 21 U.S.C.section 360bbb-3(b)(1), unless the authorization is terminated  or revoked sooner.       Influenza A by PCR NEGATIVE NEGATIVE Final   Influenza B by PCR NEGATIVE NEGATIVE Final    Comment: (NOTE) The Xpert Xpress SARS-CoV-2/FLU/RSV  plus assay is intended as an aid in the diagnosis of influenza from Nasopharyngeal swab specimens and should not be used as a sole basis for treatment. Nasal washings and aspirates are unacceptable for Xpert Xpress SARS-CoV-2/FLU/RSV testing.  Fact Sheet for Patients: bloggercourse.com  Fact Sheet for Healthcare Providers: seriousbroker.it  This test is not yet approved or cleared by the [place]  FDA and has been authorized for detection and/or diagnosis of SARS-CoV-2 by FDA under an Emergency Use Authorization (EUA). This EUA will remain in effect (meaning this test can be used) for the duration of the COVID-19 declaration under Section 564(b)(1) of the Act, 21 U.S.C. section 360bbb-3(b)(1), unless the authorization is terminated or revoked.     Resp Syncytial Virus by PCR NEGATIVE NEGATIVE Final    Comment: (NOTE) Fact Sheet for Patients: bloggercourse.com  Fact Sheet for Healthcare Providers: seriousbroker.it  This test is not yet approved or cleared by the [place]  FDA and has been authorized for detection and/or diagnosis of SARS-CoV-2 by FDA under an Emergency Use Authorization (EUA). This EUA will remain in effect (meaning this test can be used) for the duration of the COVID-19 declaration under Section 564(b)(1) of the Act, 21 U.S.C. section 360bbb-3(b)(1), unless the authorization is terminated or revoked.  Performed at [facility], [address] [place], [place] [address]   Respiratory (~20 pathogens) panel by PCR     Status: Abnormal   Collection Time: 08/12/23  3:37 PM   Specimen: Nasopharyngeal Swab; Respiratory  Result Value Ref Range Status   Adenovirus NOT DETECTED NOT DETECTED Final   Coronavirus 229E NOT DETECTED NOT DETECTED Final    Comment: (NOTE) The Coronavirus on the Respiratory Panel, DOES NOT test for the novel   Coronavirus (2019 nCoV)    Coronavirus HKU1 NOT DETECTED NOT DETECTED Final   Coronavirus NL63 NOT DETECTED NOT DETECTED Final   Coronavirus OC43 NOT DETECTED NOT DETECTED Final   Metapneumovirus NOT DETECTED NOT DETECTED Final   Rhinovirus / Enterovirus NOT DETECTED NOT DETECTED Final   Influenza A EQUIVOCAL (A) NOT DETECTED Final    Comment: Referred to [facility] in [place], [place] for serotyping.   Influenza B NOT DETECTED NOT DETECTED Final   Parainfluenza Virus 1 NOT DETECTED NOT DETECTED Final   Parainfluenza Virus 2 NOT DETECTED NOT DETECTED Final   Parainfluenza Virus 3 NOT DETECTED NOT DETECTED Final   Parainfluenza Virus 4 NOT DETECTED NOT DETECTED Final   Respiratory Syncytial Virus  NOT DETECTED NOT DETECTED Final   Bordetella pertussis NOT DETECTED NOT DETECTED Final   Bordetella Parapertussis NOT DETECTED NOT DETECTED Final   Chlamydophila pneumoniae NOT DETECTED NOT DETECTED Final   Mycoplasma pneumoniae NOT DETECTED NOT DETECTED Final    Comment: Performed at [facility], [address] [place], [place] [address]  MRSA Next Gen by PCR, Nasal     Status: None   Collection Time: 08/13/23 12:13 AM   Specimen: Nasal Mucosa; Nasal Swab  Result Value Ref Range Status   MRSA by PCR Next Gen NOT DETECTED NOT DETECTED Final    Comment: (NOTE) The GeneXpert MRSA Assay (FDA approved for NASAL specimens only), is one component of a comprehensive MRSA colonization surveillance program. It is not intended to diagnose MRSA infection nor to guide or monitor treatment for MRSA infections. Test performance is not FDA approved in patients less than 55 years old. Performed at [facility], [address], [place], [place] [address]      Time coordinating discharge: Over 30 minutes  SIGNED:   Elsie JAYSON Montclair, DO [facility] 08/17/2023, 3:24 PM Pager   If 7PM-7AM, please contact night-coverage [email or web address]

## 2023-08-17 NOTE — Telephone Encounter (Signed)
 Spoke with the pt's daughter, Rosina I made her aware that PA is needed for neb meds- due to him having HMO plan  I called ins at [phone]  Pt ID number is [id number]  I gave the clinical information they asked for and was advised and PA's will be sent for review   Reference number for these both is [id number]  Number to call back and check status if needed is [phone]

## 2023-08-17 NOTE — Telephone Encounter (Signed)
 Spoke to patient's daughter, Ashley(DPR).  Rosina stated that xopenex  is not affordable. Rosina paid out of pocket for one box.  Brovana  and Pulmicort  needs to filed under part B. I have sent both of this medications to [facility] to be filed under part B.  Sarah, please advise on Xopenex . Can patient use albuterol ? Dr. Jude is unavailable.

## 2023-08-17 NOTE — TOC Transition Note (Signed)
 Transition of Care [facility]) - Discharge Note   Patient Details  Name: Matthew Hall MRN: [id number] Date of Birth: 1946/01/10  [facility] CM/SW Contact:  Tawni CHRISTELLA Eva, LCSW Phone Number: 08/17/2023, 2:57 PM   Clinical Narrative:    CSW spoke with the pt's daughter, Rosina, to discuss recommendations for DME. The pt's daughter reported that the pt has a nebulizer at home and will need home oxygen  arranged. The pt's daughter reported no preferences regarding the DME agency. CSW sent a referral to [employer]. The pt's daughter was informed that a portable tank will be delivered to the pt's room before discharge, and the concentrator will be delivered to the home. Pt's daughter has questions about the pt's nebulizer medications. She is requesting to speak with the respiratory therapist to understand the dosage and when to administer it. MD was notified. RN will speak  with pt's daughter to discuss medication changes. No further TOC needs, TOC sign off.      Final next level of care: Home/Self Care Barriers to Discharge: Barriers Resolved   Patient Goals and CMS Choice Patient states their goals for this hospitalization and ongoing recovery are:: retrun home CMS Medicare.gov Compare Post Acute Care list provided to:: Patient Represenative (must comment) Choice offered to / list presented to : Adult Children      Discharge Placement                    Patient and family notified of of transfer: 08/17/23  Discharge Plan and Services Additional resources added to the After Visit Summary for                  DME Arranged: Oxygen  DME Agency: [employer] Date DME Agency Contacted: 08/17/23 Time DME Agency Contacted: [phone] Representative spoke with at DME Agency: [place]            Social Drivers of Health (SDOH) Interventions SDOH Screenings   Food Insecurity: No Food Insecurity (08/12/2023)  Recent Concern: Food Insecurity - Food Insecurity  Present (06/18/2023)  Housing: Low Risk  (08/12/2023)  Transportation Needs: No Transportation Needs (08/12/2023)  Recent Concern: Transportation Needs - Unmet Transportation Needs (07/13/2023)  Utilities: Not At Risk (08/12/2023)  Depression (PHQ2-9): Low Risk  (05/04/2019)  Social Connections: Moderately Isolated (08/12/2023)  Tobacco Use: Medium Risk (08/12/2023)     Readmission Risk Interventions    07/15/2023    2:01 PM 06/22/2023   11:49 AM  Readmission Risk Prevention Plan  Transportation Screening Complete Complete  Home Care Screening  Complete  Medication Review (RN CM)  Complete  Medication Review (RN Care Manager) Complete   PCP or Specialist appointment within 3-5 days of discharge Complete   HRI or Home Care Consult Complete   SW Recovery Care/Counseling Consult Complete   Palliative Care Screening Not Applicable   Skilled Nursing Facility Not Applicable

## 2023-08-17 NOTE — Care Management (Signed)
 This RNCM received call from charge RN requesting assistance with orders to provide PT,OT, SLP services per patient's daughter Ashley's request.  This RNCM spoke with patient's daughter Rosina [phone] who reports she needs outpatient PT, OT, SLP orders. Rosina reports she spoke with [facility] and they advised orders must be outpatient and not home health order. This RNCM explained normally we send [facility] HH orders. This RNCM created external ambulatory orders for MD to co-sign.  This RNCM spoke with Gery Finn who report the director is gone for the day, however will be available tomorrow. This RNCM called [facility] [phone], reached message that phone number is unable to be reached.   This RNCM spoke with Rosina to advise will fax orders with clinical to [facility]. Rosina reports [facility] advised her to fax to [facility] main fax# [phone]. Faxed clinical to [facility].  Notified MD and charge RN.  No additional TOC needs.

## 2023-08-17 NOTE — Progress Notes (Signed)
 Patient has been discharged, IV catheter removed, telemetry box removed, AVS paperwork given and explained to patient, patient understood discharge instructions. Patient has oxygen  tank and set up at 3 Liters. Daughter Rosina contacted and ready to pick up patient at around 6:30 pm.

## 2023-08-17 NOTE — Progress Notes (Signed)
 Occupational Therapy Treatment Patient Details Name: Matthew Hall MRN: [id number] DOB: July 30, 1945 Today's Date: 08/17/2023   History of present illness 78 year old male who comes into the hospital with acute hypoxic respiratory failure, concern for COPD exacerbation. Pt with history of PAF on anticoagulation, chronic systolic CHF, HTN, chronic bronchiectasis. Recent admission 07/12/23 for respiratory failure and 06/17/23 for pneumonia and sepsis.   OT comments  Pt making progress with functional goals and asking when he can go home.  Pt with 3/4 DOE SpO2 90% on room air at rest, 82% room air walking to bathroom with toileting and LB ADLs tasks, increasing to 07% on 2L O2. Reviewed deep, pursed lip breathing technique with pt. OT will continue to follow acutely to maximize level of function and safety       If plan is discharge home, recommend the following:  A little help with bathing/dressing/bathroom;A little help with walking and/or transfers   Equipment Recommendations  None recommended by OT    Recommendations for Other Services      Precautions / Restrictions Precautions Precautions: Other (comment) Precaution Comments: monitor O2, denies falls in past 6 months Restrictions Weight Bearing Restrictions Per Provider Order: No       Mobility Bed Mobility               General bed mobility comments: pt in recliner    Transfers Overall transfer level: Needs assistance Equipment used: Rolling walker (2 wheels) Transfers: Sit to/from Stand Sit to Stand: Supervision           General transfer comment: VCs hand placement     Balance Overall balance assessment: Mild deficits observed, not formally tested                                         ADL either performed or assessed with clinical judgement   ADL Overall ADL's : Needs assistance/impaired     Grooming: Wash/dry hands;Wash/dry face;Brushing hair;Contact guard assist;Standing        Lower Body Bathing: Contact guard assist;Sit to/from stand Lower Body Bathing Details (indicate cue type and reason): simulated     Lower Body Dressing: Contact guard assist   Toilet Transfer: Supervision/safety;Ambulation;Regular Toilet;Grab bars;Cueing for safety   Toileting- Clothing Manipulation and Hygiene: Contact guard assist;Sit to/from stand       Functional mobility during ADLs: Supervision/safety;Cueing for safety      Extremity/Trunk Assessment Upper Extremity Assessment Upper Extremity Assessment: Generalized weakness   Lower Extremity Assessment Lower Extremity Assessment: Defer to PT evaluation   Cervical / Trunk Assessment Cervical / Trunk Assessment: Normal    Vision Ability to See in Adequate Light: 0 Adequate Patient Visual Report: No change from baseline     Perception     Praxis      Cognition Arousal: Alert Behavior During Therapy: WFL for tasks assessed/performed Overall Cognitive Status: Within Functional Limits for tasks assessed                                          Exercises      Shoulder Instructions       General Comments      Pertinent Vitals/ Pain       Pain Assessment Pain Assessment: No/denies pain  Home Living  Prior Functioning/Environment              Frequency  Min 1X/week        Progress Toward Goals  OT Goals(current goals can now be found in the care plan section)  Progress towards OT goals: Progressing toward goals     Plan      Co-evaluation                 AM-PAC OT 6 Clicks Daily Activity     Outcome Measure   Help from another person eating meals?: None Help from another person taking care of personal grooming?: A Little Help from another person toileting, which includes using toliet, bedpan, or urinal?: A Little Help from another person bathing (including washing, rinsing, drying)?: A Little Help  from another person to put on and taking off regular upper body clothing?: A Little Help from another person to put on and taking off regular lower body clothing?: A Little 6 Click Score: 19    End of Session Equipment Utilized During Treatment: Gait belt;Rolling walker (2 wheels)  OT Visit Diagnosis: Unsteadiness on feet (R26.81);Other abnormalities of gait and mobility (R26.89);Muscle weakness (generalized) (M62.81)   Activity Tolerance Patient tolerated treatment well;Patient limited by fatigue   Patient Left with call bell/phone within reach;in bed;with bed alarm set;with nursing/sitter in room   Nurse Communication          Time: 8460-8445 OT Time Calculation (min): 15 min  Charges: OT General Charges $OT Visit: 1 Visit OT Treatments $Therapeutic Activity: 8-22 mins    Jacques Karna Loose 08/17/2023, 4:41 PM

## 2023-08-17 NOTE — Progress Notes (Signed)
 Physical Therapy Treatment Patient Details Name: Matthew Hall MRN: [id number] DOB: 10/09/1945 Today's Date: 08/17/2023   History of Present Illness 78 year old male who comes into the hospital with acute hypoxic respiratory failure, concern for COPD exacerbation. Pt with history of PAF on anticoagulation, chronic systolic CHF, HTN, chronic bronchiectasis. Recent admission 07/12/23 for respiratory failure and 06/17/23 for pneumonia and sepsis.    PT Comments  Pt tolerated increased ambulation distance of 130' with RW and 4 standing rest breaks 2* 3/4 dyspnea. SpO2  90% on room air at rest, 80% room air walking, 94% on 3L O2 walking.  Reviewed pursed lip breathing technique with pt.    If plan is discharge home, recommend the following: Assistance with cooking/housework;Assist for transportation;Help with stairs or ramp for entrance   Can travel by private vehicle        Equipment Recommendations  None recommended by PT    Recommendations for Other Services       Precautions / Restrictions Precautions Precautions: Other (comment) Precaution Comments: monitor O2, denies falls in past 6 months Restrictions Weight Bearing Restrictions Per Provider Order: No     Mobility  Bed Mobility Overal bed mobility: Modified Independent             General bed mobility comments: HOB up, used rail    Transfers Overall transfer level: Needs assistance Equipment used: Rolling walker (2 wheels) Transfers: Sit to/from Stand Sit to Stand: Supervision           General transfer comment: VCs hand placement    Ambulation/Gait Ambulation/Gait assistance: Contact guard assist Gait Distance (Feet): 130 Feet Assistive device: Rolling walker (2 wheels) Gait Pattern/deviations: Step-to pattern, Decreased dorsiflexion - right, Steppage Gait velocity: WFL     General Gait Details: pt wore R shoe lift from home for foot drop; SpO2 80% room air walking, 90% room air rest, 94% 3L O2  walking. Pt took ~4 standing rest breaks 2* 3/4 dyspnea, no loss of balance.   Stairs             Wheelchair Mobility     Tilt Bed    Modified Rankin (Stroke Patients Only)       Balance Overall balance assessment: Mild deficits observed, not formally tested                                          Cognition Arousal: Alert Behavior During Therapy: WFL for tasks assessed/performed Overall Cognitive Status: Within Functional Limits for tasks assessed                                          Exercises      General Comments        Pertinent Vitals/Pain Pain Assessment Pain Assessment: No/denies pain    Home Living                          Prior Function            PT Goals (current goals can now be found in the care plan section) Acute Rehab PT Goals Patient Stated Goal: improve breathing, return to [facility] and do HHPT PT Goal Formulation: With patient Time For Goal Achievement: 08/29/23 Potential to Achieve Goals: Good Progress towards  PT goals: Progressing toward goals    Frequency    Min 1X/week      PT Plan      Co-evaluation              AM-PAC PT 6 Clicks Mobility   Outcome Measure  Help needed turning from your back to your side while in a flat bed without using bedrails?: None Help needed moving from lying on your back to sitting on the side of a flat bed without using bedrails?: None Help needed moving to and from a bed to a chair (including a wheelchair)?: None Help needed standing up from a chair using your arms (e.g., wheelchair or bedside chair)?: None Help needed to walk in hospital room?: A Little Help needed climbing 3-5 steps with a railing? : A Little 6 Click Score: 22    End of Session Equipment Utilized During Treatment: Gait belt;Oxygen  Activity Tolerance: Patient limited by fatigue Patient left: in chair;with call bell/phone within reach Nurse  Communication: Mobility status PT Visit Diagnosis: Difficulty in walking, not elsewhere classified (R26.2)     Time: 8663-8587 PT Time Calculation (min) (ACUTE ONLY): 36 min  Charges:    $Gait Training: 23-37 mins PT General Charges $$ ACUTE PT VISIT: 1 Visit                     Sylvan Delon Copp PT 08/17/2023  Acute Rehabilitation Services  Office [phone]

## 2023-08-17 NOTE — Progress Notes (Signed)
 Outpatient meds delivered to patient's nurse Tera Fellows, RN.

## 2023-08-20 ENCOUNTER — Telehealth: Payer: Self-pay | Admitting: Pulmonary Disease

## 2023-08-20 DIAGNOSIS — J438 Other emphysema: Secondary | ICD-10-CM

## 2023-08-20 DIAGNOSIS — J471 Bronchiectasis with (acute) exacerbation: Secondary | ICD-10-CM

## 2023-08-20 DIAGNOSIS — J449 Chronic obstructive pulmonary disease, unspecified: Secondary | ICD-10-CM

## 2023-08-20 NOTE — Telephone Encounter (Signed)
 Spoke with Matthew Hall, Pt does not have a PCP did advise her that Discharge summary says he should only be wearing O2 with exertion, she would like to know if we can get him a POC as he is using the rollator and its a challenge to carry the tank and walk with rollator. I advised he I would talk with Dr Villa Greaser tomorrow and make sure he was okay with us  ordering this. She verbalized understanding

## 2023-08-20 NOTE — Telephone Encounter (Signed)
 Per mychart encounter sent on 08/20/23: Hi Dr. Villa Greaser, My dad was sent to the hospital and tested positive the flu. He is now home (discharged Friday), and he's now on 3L of oxygen  (he's never been on oxygen  at home). The hospitalist did not recommend a follow up visit per the d/c notes, but I wanted you know. Should we schedule an appointment? Or try to wean him off oxygen ? Or schedule a  test to determine if oxygen  is needed? Thanks, Ashley/Ashton    Please advise appointment schedule for soonest available on 4/2

## 2023-08-20 NOTE — Telephone Encounter (Signed)
 Pt's daughter calling (DPR). PT was just discharged on the 7th from [facility] for flu. He just had a visit w/Dr. Alva a few days before this. ER sent him home with 3LT of 02 at home. Should she try to wean him down off 02? Is there some kind of test he needs to take if he should continue 02. He does have an order for therapy as well, she said. Perhaps they should make recommendations once he starts. Please call daughter to advise @ [phone] Odilia Bennett  Also, the larger tanks he has may be an issue as a fall hazard. Can he get a POC possibly?  PCC's: Tammy had sched a sleep study. As the daughter understands it is still going thru approval so she wanted to follow up about the status of that. Does she need to call the ins company? Again, please call daughter.   A PA was put in for Pulmicort  & Brovana  but the hospital put him on another med (Atrovent ) .What is Dr. Hortense Lyons preference? Should we stop the PA Process?

## 2023-08-21 ENCOUNTER — Encounter (HOSPITAL_BASED_OUTPATIENT_CLINIC_OR_DEPARTMENT_OTHER): Payer: Self-pay

## 2023-08-21 ENCOUNTER — Other Ambulatory Visit (HOSPITAL_COMMUNITY): Payer: Self-pay

## 2023-08-21 NOTE — Telephone Encounter (Signed)
POC order placed. [name] is aware. A sooner appt was made at [facility] [address]. Amessage to prior auth team has been sent to see about PA status.

## 2023-08-21 NOTE — Telephone Encounter (Signed)
Patient uses rollator and is need of POC. He cannot maneuver the tank while ambulating. He has a FU appt with you 4/2 there is nothing sooner. Saturation walk done by nurse in hospital as well as therapy.  SATURATION QUALIFICATIONS: (This note is used to comply with regulatory documentation for home oxygen)   Patient Saturations on Room Air at Rest = 90%   Patient Saturations on Room Air while Ambulating = 80%   Patient Saturations on 3 Liters of oxygen while Ambulating = 94%   Please briefly explain why patient needs home oxygen: to maintain appropriate SpO2 levels.    Ralene Bathe Kistler PT 08/17/2023  Acute Rehabilitation Services    Is a POC something we can order now or does he have to wait? If you saw him in hospital I can get in with NP sooner but didn't see if you saw him or not. Please advise.

## 2023-08-21 NOTE — Telephone Encounter (Signed)
Nebul[employer] solutions for this patient must be run under Medicare Part B.

## 2023-08-22 ENCOUNTER — Telehealth (HOSPITAL_BASED_OUTPATIENT_CLINIC_OR_DEPARTMENT_OTHER): Payer: Self-pay | Admitting: Pulmonary Disease

## 2023-08-22 NOTE — Telephone Encounter (Signed)
Patient calling regarding POC, [employer] states they have not received the order. Advised it had been sent today at 1pm.  Also patient was calling back about the Split Night Study for patient. Please advise if any progress has been made with scheduling.

## 2023-08-23 NOTE — Telephone Encounter (Signed)
Called to check the status of PA  Was placed on a 10 min hold  Will try back later

## 2023-08-24 ENCOUNTER — Telehealth: Payer: Self-pay | Admitting: Cardiology

## 2023-08-24 NOTE — Telephone Encounter (Signed)
Patients daughter is calling back again regarding both things from 08/22/23 please advise and call patients   FAX: [phone] direct fax has nothing on file- patient is a fall risk

## 2023-08-24 NOTE — Telephone Encounter (Signed)
Please clarify ordered to state 3 L POC  Also please have PCC follow-up on sleep study scheduling  Patient Saturations on Room Air at Rest = 90%   Patient Saturations on Room Air while Ambulating = 80%   Patient Saturations on 3 Liters of oxygen while Ambulating = 94%

## 2023-08-24 NOTE — Telephone Encounter (Signed)
Patient states he had a stroke about 6 months ago and he would like to discuss any restrictions or limitations he should have with activity. Please advise.

## 2023-08-24 NOTE — Telephone Encounter (Deleted)
Called [employer] and they wanted Doc signature, NPI, Liter flow where it says POC.

## 2023-08-24 NOTE — Telephone Encounter (Signed)
Resent Fax. Called [employer] and the Referral is missing information . They wanted Doc signature, NPI, and they also need the Liter flow where it says POC.

## 2023-08-24 NOTE — Telephone Encounter (Signed)
Routing to PA team to check.

## 2023-08-27 NOTE — Telephone Encounter (Signed)
Left a message for patient to call office back regarding stroke activity

## 2023-08-27 NOTE — Telephone Encounter (Signed)
Spoke to pt's daughter (DPR) who verbalized understanding. Pt's daughter stated that pt has moved to [facility] and would like to switch providers to [facility] office.   Please advise.

## 2023-08-28 ENCOUNTER — Other Ambulatory Visit (HOSPITAL_BASED_OUTPATIENT_CLINIC_OR_DEPARTMENT_OTHER): Payer: Self-pay

## 2023-08-28 MED ORDER — METFORMIN HCL ER 500 MG PO TB24
500.0000 mg | ORAL_TABLET | Freq: Every day | ORAL | 3 refills | Status: DC
  Filled 2023-08-28 – 2023-09-07 (×2): qty 90, 90d supply, fill #0
  Filled 2023-10-15 – 2023-11-28 (×2): qty 90, 90d supply, fill #1
  Filled 2023-11-28: qty 30, 30d supply, fill #1
  Filled 2023-12-12 – 2023-12-21 (×2): qty 30, 30d supply, fill #2
  Filled 2024-01-16: qty 30, 30d supply, fill #3

## 2023-08-28 NOTE — Telephone Encounter (Signed)
Order sent to [employer] , awaiting confirmation

## 2023-08-28 NOTE — Telephone Encounter (Signed)
[employer] states order was missing a lot of info   Pt is frustrated and asked me to send order to [employer]

## 2023-08-29 ENCOUNTER — Ambulatory Visit (HOSPITAL_COMMUNITY)
Admission: RE | Admit: 2023-08-29 | Discharge: 2023-08-29 | Disposition: A | Discharge status: Home / Self Care | Payer: Medicare Other | Source: Ambulatory Visit | Attending: Family Medicine | Admitting: Family Medicine

## 2023-08-29 DIAGNOSIS — I639 Cerebral infarction, unspecified: Secondary | ICD-10-CM | POA: Insufficient documentation

## 2023-08-29 DIAGNOSIS — R059 Cough, unspecified: Secondary | ICD-10-CM

## 2023-08-29 DIAGNOSIS — R1310 Dysphagia, unspecified: Secondary | ICD-10-CM | POA: Diagnosis present

## 2023-08-29 DIAGNOSIS — J471 Bronchiectasis with (acute) exacerbation: Secondary | ICD-10-CM | POA: Diagnosis not present

## 2023-08-29 NOTE — Telephone Encounter (Signed)
New rx with [facility] on it, notes amended from 1/29 stating he needs O2 and sats need to be added to note as well

## 2023-08-29 NOTE — Telephone Encounter (Signed)
Spoke to pt's daughter and pt is almost out of O2. They dont want anything to do with [employer], [employer] can not pick up O2 til Monday, [employer] can process for delivery on Monday if these are completed ASAP :  New RX with [employer] on it notes amended from 1/29 stating he needs O2  sats need to be added to note   Thank you

## 2023-08-29 NOTE — Evaluation (Signed)
Modified Barium Swallow Study  Patient Details  Name: Matthew Hall MRN: [id number] Date of Birth: Dec 16, 1945  Today's Date: 08/29/2023  Modified Barium Swallow completed.  Full report located under Chart Review in the Imaging Section.  History of Present Illness 78 yo referred for OP MBS by pulmonology given PNA post-stroke. Pt denies any symptoms of dysphagia unless he eats in supine position. Pt was seen by SLP at [facility] in January 2024, with clinical swallow eval that appeared to be [facility]. PMH includes: multifocal PNA, COPD, bronchiectasis, MAC, ischemic CVA, GERD, permanent Afib on AC, cardiomyopathy, mid range EF, polyneuropathy, R sided foot drop, vasculitis, bladder CA, HTN, DM, recent hospitalization for flu   Clinical Impression Pt presents with a fairly functional oropharyngeal swallow with no aspiration observed. He has moments of more repetitive lingual transit and piecemeal swallowing, but he has good oral clearance. He primarily initiates his swallow at the valleculae. There is mildly reduced anterior hyoid movement and pharyngeal squeeze with mild residue remaining in the valleculae. He had a single instance of frank penetration while coordinating thin liquid with the barium tablet (PAS 3), but this was trace and very shallow. Of note, that barium tablet also remained in the distal esophagus for an extended duration, requiring multiple additional boluses to ultimately clear it. Pt also had the appearance of a small outpouching in his proximal esophagus that retained small amounts of residue with solids, but cleared with liquids. May want to consider a more dedicated assessment of the esophagus per MD discretion. For now, educated pt on the importance of thorough mastication and use of liquid washes during intake.  DIGEST Swallow Severity Rating*  Safety: 1  Efficiency: 1  Overall Pharyngeal Swallow Severity: 1 1: mild; 2: moderate; 3: severe; 4: profound  *The Dynamic Imaging  Grade of Swallowing Toxicity is standardized for the head and neck cancer population, however, demonstrates promising clinical applications across populations to standardize the clinical rating of pharyngeal swallow safety and severity.   Factors that may increase risk of adverse event in presence of aspiration Rubye Oaks & Clearance Coots 2021): Respiratory or GI disease;Weak cough  Swallow Evaluation Recommendations Recommendations: PO diet PO Diet Recommendation: Regular;Thin liquids (Level 0) (as tolerated - can pick softer foods if trouble chewing regular ones as pt reports he is limited by his dentition) Liquid Administration via: Cup;Straw Medication Administration: Whole meds with liquid (may want to see if he can crush larger pills, but would at least use multiple liquid washes) Supervision: Patient able to self-feed Swallowing strategies  : Slow rate;Small bites/sips;Follow solids with liquids Postural changes: Position pt fully upright for meals;Stay upright 30-60 min after meals Oral care recommendations: Oral care BID (2x/day) Recommended consults: Consider GI consultation;Consider esophageal assessment      Mahala Menghini., M.A. CCC-SLP Acute Rehabilitation Services Office [phone]  Secure chat preferred  08/29/2023,1:39 PM

## 2023-08-30 ENCOUNTER — Other Ambulatory Visit (HOSPITAL_BASED_OUTPATIENT_CLINIC_OR_DEPARTMENT_OTHER): Payer: Self-pay

## 2023-08-30 DIAGNOSIS — J479 Bronchiectasis, uncomplicated: Secondary | ICD-10-CM

## 2023-08-30 DIAGNOSIS — J438 Other emphysema: Secondary | ICD-10-CM

## 2023-08-30 DIAGNOSIS — J471 Bronchiectasis with (acute) exacerbation: Secondary | ICD-10-CM

## 2023-08-30 DIAGNOSIS — J449 Chronic obstructive pulmonary disease, unspecified: Secondary | ICD-10-CM

## 2023-08-30 NOTE — Telephone Encounter (Signed)
Matthew Hall; Matthew Hall; Matthew Hall, [name] I will get this ready for Monday per over conversation about [employer] not being about to pick up till then.  Confirmed   Closing encounter

## 2023-08-30 NOTE — Telephone Encounter (Signed)
This is through Medicare Part B and we are not contracted to communicate with them in regards to prior authorizations. This is the responsibility of the providers office.

## 2023-08-30 NOTE — Telephone Encounter (Signed)
Order placed and signed by Everardo All

## 2023-08-30 NOTE — Telephone Encounter (Signed)
Order done and signed by Everardo All

## 2023-08-30 NOTE — Telephone Encounter (Signed)
Spoke to patient's daughter, Ashley(DPR) for update. She stated that pt had a recent admission and all meds were changed. Pt has pending appt 2/28 and will discuss meds further during that appt.  Nothing further needed at this time.

## 2023-08-31 ENCOUNTER — Ambulatory Visit (HOSPITAL_BASED_OUTPATIENT_CLINIC_OR_DEPARTMENT_OTHER): Payer: Medicare Other | Admitting: Family

## 2023-09-03 ENCOUNTER — Other Ambulatory Visit (HOSPITAL_BASED_OUTPATIENT_CLINIC_OR_DEPARTMENT_OTHER): Payer: Self-pay

## 2023-09-03 DIAGNOSIS — J449 Chronic obstructive pulmonary disease, unspecified: Secondary | ICD-10-CM

## 2023-09-07 ENCOUNTER — Other Ambulatory Visit (HOSPITAL_BASED_OUTPATIENT_CLINIC_OR_DEPARTMENT_OTHER): Payer: Self-pay

## 2023-09-07 ENCOUNTER — Other Ambulatory Visit: Payer: Self-pay | Admitting: Family Medicine

## 2023-09-07 ENCOUNTER — Inpatient Hospital Stay: Payer: Medicare Other | Admitting: Adult Health

## 2023-09-07 MED ORDER — ONDANSETRON HCL 4 MG PO TABS
4.0000 mg | ORAL_TABLET | Freq: Three times a day (TID) | ORAL | 0 refills | Status: DC | PRN
Start: 2023-09-07 — End: 2023-09-10
  Filled 2023-09-07: qty 21, 7d supply, fill #0

## 2023-09-07 NOTE — Telephone Encounter (Signed)
 Copied from CRM [id number]. Topic: Clinical - Medication Refill >> Sep 07, 2023  9:53 AM Alcus Dad wrote: Most Recent Primary Care Visit:   Medication: apixaban (ELIQUIS) 5 MG TABS tablet  Has the patient contacted their pharmacy? No (Agent: If no, request that the patient contact the pharmacy for the refill. If patient does not wish to contact the pharmacy document the reason why and proceed with request.) (Agent: If yes, when and what did the pharmacy advise?)  Is this the correct pharmacy for this prescription? Yes If no, delete pharmacy and type the correct one.  This is the patient's preferred pharmacy:   [facility])   Has the prescription been filled recently? No  Is the patient out of the medication? No  Has the patient been seen for an appointment in the last year OR does the patient have an upcoming appointment? Yes  Can we respond through MyChart? Yes  Agent: Please be advised that Rx refills may take up to 3 business days. We ask that you follow-up with your pharmacy.

## 2023-09-09 ENCOUNTER — Encounter (HOSPITAL_BASED_OUTPATIENT_CLINIC_OR_DEPARTMENT_OTHER): Payer: Self-pay

## 2023-09-10 ENCOUNTER — Telehealth: Payer: Self-pay

## 2023-09-10 ENCOUNTER — Other Ambulatory Visit: Payer: Self-pay

## 2023-09-10 ENCOUNTER — Other Ambulatory Visit (HOSPITAL_BASED_OUTPATIENT_CLINIC_OR_DEPARTMENT_OTHER): Payer: Self-pay

## 2023-09-10 ENCOUNTER — Encounter: Payer: Self-pay | Admitting: Urgent Care

## 2023-09-10 ENCOUNTER — Ambulatory Visit: Payer: Medicare Other | Admitting: Urgent Care

## 2023-09-10 VITALS — BP 114/75 | HR 86 | Ht 71.0 in | Wt 138.0 lb

## 2023-09-10 DIAGNOSIS — R29898 Other symptoms and signs involving the musculoskeletal system: Secondary | ICD-10-CM | POA: Diagnosis not present

## 2023-09-10 DIAGNOSIS — Z8551 Personal history of malignant neoplasm of bladder: Secondary | ICD-10-CM

## 2023-09-10 DIAGNOSIS — A310 Pulmonary mycobacterial infection: Secondary | ICD-10-CM

## 2023-09-10 DIAGNOSIS — E1142 Type 2 diabetes mellitus with diabetic polyneuropathy: Secondary | ICD-10-CM

## 2023-09-10 DIAGNOSIS — I480 Paroxysmal atrial fibrillation: Secondary | ICD-10-CM | POA: Diagnosis not present

## 2023-09-10 DIAGNOSIS — R634 Abnormal weight loss: Secondary | ICD-10-CM | POA: Diagnosis not present

## 2023-09-10 DIAGNOSIS — I5022 Chronic systolic (congestive) heart failure: Secondary | ICD-10-CM

## 2023-09-10 DIAGNOSIS — I10 Essential (primary) hypertension: Secondary | ICD-10-CM

## 2023-09-10 DIAGNOSIS — R5381 Other malaise: Secondary | ICD-10-CM

## 2023-09-10 DIAGNOSIS — J441 Chronic obstructive pulmonary disease with (acute) exacerbation: Secondary | ICD-10-CM

## 2023-09-10 DIAGNOSIS — R5383 Other fatigue: Secondary | ICD-10-CM | POA: Diagnosis not present

## 2023-09-10 DIAGNOSIS — E1165 Type 2 diabetes mellitus with hyperglycemia: Secondary | ICD-10-CM

## 2023-09-10 MED ORDER — APIXABAN 5 MG PO TABS
5.0000 mg | ORAL_TABLET | Freq: Two times a day (BID) | ORAL | 1 refills | Status: DC
  Filled 2023-09-10: qty 60, 30d supply, fill #0

## 2023-09-10 MED ORDER — BREZTRI AEROSPHERE 160-9-4.8 MCG/ACT IN AERO
2.0000 | INHALATION_SPRAY | Freq: Two times a day (BID) | RESPIRATORY_TRACT | 11 refills | Status: AC
  Filled 2023-09-10: qty 10.7, 30d supply, fill #0
  Filled 2023-10-29: qty 10.7, 30d supply, fill #1
  Filled 2023-11-28: qty 10.7, 30d supply, fill #2
  Filled 2023-12-12 – 2023-12-21 (×2): qty 10.7, 30d supply, fill #3
  Filled 2024-01-16: qty 10.7, 30d supply, fill #4
  Filled 2024-02-21 – 2024-02-26 (×2): qty 10.7, 30d supply, fill #5
  Filled 2024-02-28: qty 10.7, 30d supply, fill #0
  Filled 2024-03-24: qty 10.7, 30d supply, fill #1
  Filled 2024-04-21: qty 10.7, 30d supply, fill #2
  Filled 2024-05-23 (×2): qty 10.7, 30d supply, fill #3

## 2023-09-10 MED ORDER — APIXABAN 5 MG PO TABS: 5.0000 mg | ORAL_TABLET | Freq: Two times a day (BID) | ORAL | Status: DC

## 2023-09-10 MED ORDER — APIXABAN 5 MG PO TABS
5.0000 mg | ORAL_TABLET | Freq: Two times a day (BID) | ORAL | 1 refills | Status: DC
  Filled 2023-09-10 – 2023-10-07 (×2): qty 60, 30d supply, fill #0
  Filled 2023-10-15 – 2023-10-31 (×2): qty 60, 30d supply, fill #1

## 2023-09-10 MED ORDER — OMEPRAZOLE 20 MG PO CPDR
20.0000 mg | DELAYED_RELEASE_CAPSULE | Freq: Every day | ORAL | 2 refills | Status: DC
  Filled 2023-09-10 – 2023-10-01 (×4): qty 90, 90d supply, fill #0
  Filled 2023-10-15 – 2023-11-28 (×2): qty 90, 90d supply, fill #1
  Filled 2023-11-28: qty 30, 30d supply, fill #1
  Filled 2023-12-12: qty 90, 90d supply, fill #1
  Filled 2023-12-21: qty 30, 30d supply, fill #1
  Filled 2024-01-16: qty 30, 30d supply, fill #2
  Filled 2024-02-21 – 2024-02-26 (×2): qty 30, 30d supply, fill #3
  Filled 2024-02-28: qty 30, 30d supply, fill #0
  Filled 2024-03-24: qty 30, 30d supply, fill #1
  Filled 2024-04-21 – 2024-04-28 (×2): qty 30, 30d supply, fill #2
  Filled 2024-05-26: qty 30, 30d supply, fill #3

## 2023-09-10 MED ORDER — IPRATROPIUM BROMIDE 0.02 % IN SOLN
0.5000 mg | Freq: Three times a day (TID) | RESPIRATORY_TRACT | 12 refills | Status: DC
  Filled 2023-09-10: qty 75, 10d supply, fill #0

## 2023-09-10 NOTE — Patient Instructions (Addendum)
 Please fill the [employer]. If this is covered, stop the Symbicort and Atrovent. If not covered, continue the previous breathing modalities.  We drew labs today to assess for his current symptoms. Continue PT/OT/SLP.  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 [address], [place], [place] [address] Phone: [phone]  Please return in 1-2 weeks for recheck.

## 2023-09-10 NOTE — Telephone Encounter (Signed)
 Please advise   Copied from CRM [id number]. Topic: Clinical - Prescription Issue >> Sep 10, 2023  3:46 PM Kathryne Eriksson wrote: Reason for CRM: apixaban (ELIQUIS) 5 MG TABS tablet >> Sep 10, 2023  3:48 PM Kathryne Eriksson wrote: Patient states the pharmacy hasn't received the apixaban (ELIQUIS) 5 MG TABS tablet . States that it's urgent he's able to have it by tomorrow, patient wants an update following this matter.

## 2023-09-10 NOTE — Progress Notes (Signed)
 New Patient Office Visit  Subjective:  Patient ID: Matthew Hall, male    DOB: 05-May-1946  Age: 78 y.o. MRN: [id number]  CC:  Chief Complaint  Patient presents with   Establish Care    New pt. Est care. He was prescribed  in the hospital in October. He will need refills. Pt is on 3 liters of O2 and also has had trouble getting his sleep study scheduled.    HPI Matthew Hall presents to establish care  Discussed the use of AI scribe software for clinical note transcription with the patient, who gave verbal consent to proceed.  History of Present Illness   Matthew Hall is a 78 year old male with chronic pulmonary issues who presents for follow-up after recent hospitalizations. He is accompanied by his daughter, who is also his primary caregiver.  He has a significant history of chronic pulmonary issues, including multiple hospitalizations from November to February for pneumonia and other pulmonary conditions. The last hospitalization was on February 2nd for observation, which led to admission. He has chronic infections and was diagnosed with bronchiectasis and atelectasis. He uses a PEP device and a breather device daily, though not consistently, and is working with speech therapy on respiration. He lives independently but receives assistance from a private company called [employer] and in-house therapy from OT, PT, and speech therapy. He is currently on three liters of oxygen since his last hospitalization and uses Atrovent and sodium nebulizers, as well as a Xopenex inhaler for rescue. He has not been using Symbicort recently.  He has a history of mycobacterium avium complex (MAC) and was treated with one round of minocycline in December. A bronchoscopy was performed to identify the infection, and a modified barium swallow study showed no aspiration. His airways are weak, making him susceptible to infections. No new respiratory issues since discharge on February 7th.  He has a  cardiac history of atrial fibrillation and was noted to have a reduced ejection fraction of 40-45% in an echocardiogram from October 2024. He is on Eliquis for atrial fibrillation and metoprolol for rate control. He experiences rapid heart rate with albuterol, which is why he is on Xopenex instead. No current symptoms of congestive heart failure, but he prefers not to lie flat due to acid reflux.  He has experienced significant weight loss, losing 35 pounds over the past year, and attributes some of this to his COPD. He consumes oatmeal with water for breakfast.  He has a history of neuropathy, which he attributes to a past high dose of simvastatin. He experiences foot drop and has been prescribed gabapentin and Cymbalta, though he is hesitant to take them due to side effects. He takes Xanax for anxiety, prescribed by his previous doctor in [place]. He wears a ankle brace on his R ankle to help with his foot drop.  He has a history of bladder cancer 30 years ago and has had annual cystoscopies since then. He also has a history of skin cancer and was scheduled to see a dermatologist but had to cancel due to illness.       Outpatient Encounter Medications as of 09/10/2023  Medication Sig   Acetaminophen (TYLENOL) 325 MG CAPS Take 1-2 capsules by mouth every 6 (six) hours as needed (pain).   ALPRAZolam (XANAX) 0.25 MG tablet Take 0.25 mg by mouth 2 (two) times daily as needed for anxiety or sleep.    Budeson-Glycopyrrol-Formoterol (BREZTRI AEROSPHERE) 160-9-4.8 MCG/ACT AERO Inhale 2 puffs into the  lungs 2 (two) times daily.   Cyanocobalamin (VITAMIN B12 PO) Take 1 tablet by mouth daily.   ELDERBERRY PO Take 1 tablet by mouth daily.   feeding supplement (ENSURE ENLIVE / ENSURE PLUS) LIQD Take 237 mLs by mouth 2 (two) times daily between meals.   levalbuterol (XOPENEX) 0.63 MG/3ML nebulizer solution Take 3 mLs (0.63 mg total) by nebulization every 6 (six) hours as needed for wheezing or shortness of  breath.   metFORMIN (GLUCOPHAGE-XR) 500 MG 24 hr tablet Take 1 tablet (500 mg total) by mouth daily.   metoprolol succinate (TOPROL-XL) 25 MG 24 hr tablet TAKE 1 TABLET BY MOUTH TWICE A DAY   triamcinolone (NASACORT) 55 MCG/ACT AERO nasal inhaler Place 1 spray into the nose daily.   [DISCONTINUED] apixaban (ELIQUIS) 5 MG TABS tablet Take 1 tablet (5 mg total) by mouth 2 (two) times daily.   [DISCONTINUED] budesonide-formoterol (SYMBICORT) 160-4.5 MCG/ACT inhaler Inhale 2 puffs into the lungs 2 (two) times daily.   [DISCONTINUED] esomeprazole (NEXIUM) 20 MG capsule Take 20 mg by mouth daily at 12 noon.   [DISCONTINUED] ipratropium (ATROVENT) 0.02 % nebulizer solution Take 2.5 mLs (0.5 mg total) by nebulization 3 (three) times daily.   [DISCONTINUED] sodium chloride HYPERTONIC 3 % nebulizer solution Take 4 mLs by nebulization 2 (two) times daily. (Patient taking differently: Take 4 mLs by nebulization 2 (two) times daily. Mix with Pulmicort Nebulizer Solution)   apixaban (ELIQUIS) 5 MG TABS tablet Take 1 tablet (5 mg total) by mouth 2 (two) times daily.   esomeprazole (NEXIUM) 20 MG capsule Take 1 capsule (20 mg total) by mouth daily.   guaiFENesin (MUCINEX) 600 MG 12 hr tablet Take 1 tablet (600 mg total) by mouth 2 (two) times daily. (Patient not taking: Reported on 09/10/2023)   ipratropium (ATROVENT) 0.02 % nebulizer solution Take 2.5 mLs (0.5 mg total) by nebulization 3 (three) times daily.   nystatin (MYCOSTATIN) 100000 UNIT/ML suspension Take 5 mLs (500,000 Units total) by mouth 4 (four) times daily. (Patient not taking: Reported on 09/10/2023)   [DISCONTINUED] apixaban (ELIQUIS) 5 MG TABS tablet Take 1 tablet (5 mg total) by mouth 2 (two) times daily.   [DISCONTINUED] ondansetron (ZOFRAN) 4 MG tablet Take 1 tablet (4 mg total) by mouth 3 (three) times daily as needed for nausea and vomiting (Patient not taking: Reported on 09/10/2023)   No facility-administered encounter medications on file as of  09/10/2023.    Past Medical History:  Diagnosis Date   Anxiety    Aortic atherosclerosis (HCC)    Atrial fibrillation (HCC)    Basal cell carcinoma    Bladder cancer (HCC)    Bladder tumor    Chronic systolic heart failure (HCC)    Colon polyps    COPD (chronic obstructive pulmonary disease) (HCC)    Cutaneous vasculitis    Diabetes (HCC)    Diabetic peripheral neuropathy (HCC) 12/27/2020   Diverticulosis    Essential hypertension    GERD (gastroesophageal reflux disease)    History of pneumonia    Nephrolithiasis    Osteoarthritis    Right foot drop    Stroke [facility])     Past Surgical History:  Procedure Laterality Date   BRONCHIAL WASHINGS  06/19/2023   Procedure: BRONCHIAL WASHINGS;  Surgeon: Steffanie Dunn, DO;  Location: [facility] ENDOSCOPY;  Service: Cardiopulmonary;;   COLONOSCOPY     Kidney stones removed  1996   SKIN CANCER EXCISION Left    2003   TRANSURETHRAL RESECTION OF BLADDER TUMOR  1996   VIDEO BRONCHOSCOPY N/A 06/19/2023   Procedure: VIDEO BRONCHOSCOPY WITHOUT FLUORO;  Surgeon: Steffanie Dunn, DO;  Location: [facility] ENDOSCOPY;  Service: Cardiopulmonary;  Laterality: N/A;    Family History  Problem Relation Age of Onset   Diabetes Mother    CAD Mother    Emphysema Father    Diabetes Sister    Stroke Sister    Lung cancer Brother    Liver cancer Brother    Colon cancer Neg Hx     Social History   Socioeconomic History   Marital status: Married    Spouse name: Not on file   Number of children: 3   Years of education: Not on file   Highest education level: Not on file  Occupational History   Occupation: Pipefilter/retired    Comment: [employer]  Tobacco Use   Smoking status: Former    Current packs/day: 0.00    Average packs/day: 1 pack/day for 30.0 years (30.0 ttl pk-yrs)    Types: Cigarettes    Start date: 07/06/1981    Quit date: 07/07/2011    Years since quitting: 12.1   Smokeless tobacco: Never  Vaping Use   Vaping status: Never Used   Substance and Sexual Activity   Alcohol use: No    Alcohol/week: 0.0 standard drinks of alcohol   Drug use: No   Sexual activity: Not Currently  Other Topics Concern   Not on file  Social History Narrative   Lives with wife   Caffeine- 1 cup a day   Right handed    Social Drivers of Health   Financial Resource Strain: Not on file  Food Insecurity: No Food Insecurity (08/12/2023)   Hunger Vital Sign    Worried About Running Out of Food in the Last Year: Never true    Ran Out of Food in the Last Year: Never true  Recent Concern: Food Insecurity - Food Insecurity Present (06/18/2023)   Hunger Vital Sign    Worried About Running Out of Food in the Last Year: Sometimes true    Ran Out of Food in the Last Year: Sometimes true  Transportation Needs: No Transportation Needs (08/12/2023)   PRAPARE - Administrator, Civil Service (Medical): No    Lack of Transportation (Non-Medical): No  Recent Concern: Transportation Needs - Unmet Transportation Needs (07/13/2023)   PRAPARE - Administrator, Civil Service (Medical): Yes    Lack of Transportation (Non-Medical): Yes  Physical Activity: Not on file  Stress: Not on file  Social Connections: Moderately Isolated (08/12/2023)   Social Connection and Isolation Panel [NHANES]    Frequency of Communication with Friends and Family: More than three times a week    Frequency of Social Gatherings with Friends and Family: Once a week    Attends Religious Services: Never    Database administrator or Organizations: No    Attends Banker Meetings: Never    Marital Status: Married  Catering manager Violence: Not At Risk (08/12/2023)   Humiliation, Afraid, Rape, and Kick questionnaire    Fear of Current or Ex-Partner: No    Emotionally Abused: No    Physically Abused: No    Sexually Abused: No    ROS: as noted in HPI  Objective:  BP 114/75   Pulse 86   Ht 5\' 11"  (1.803 m)   Wt 138 lb (62.6 kg)   SpO2 94%   BMI  19.25 kg/m   Physical Exam Vitals and  nursing note reviewed. Exam conducted with a chaperone present (daughter).  Constitutional:      General: He is not in acute distress.    Appearance: Normal appearance. He is not ill-appearing, toxic-appearing or diaphoretic.     Comments: Thin, chronically ill appearing Pt with supplemental O2 via nasal cannula  HENT:     Head: Normocephalic and atraumatic.     Right Ear: Tympanic membrane, ear canal and external ear normal. There is no impacted cerumen.     Left Ear: Tympanic membrane, ear canal and external ear normal. There is no impacted cerumen.     Nose: Nose normal.     Mouth/Throat:     Mouth: Mucous membranes are moist.     Pharynx: Oropharynx is clear. No oropharyngeal exudate or posterior oropharyngeal erythema.     Comments: Suspected glossitis to tongue Eyes:     General: No scleral icterus.       Right eye: No discharge.        Left eye: No discharge.     Extraocular Movements: Extraocular movements intact.     Pupils: Pupils are equal, round, and reactive to light.  Neck:     Thyroid: No thyroid mass, thyromegaly or thyroid tenderness.  Cardiovascular:     Rate and Rhythm: Normal rate and regular rhythm.     Pulses: Normal pulses.  Pulmonary:     Effort: Pulmonary effort is normal. No respiratory distress.     Breath sounds: No stridor. No wheezing or rhonchi.     Comments: Significantly decreased breath sounds Musculoskeletal:        General: Deformity (R foot in brace due to foot drop) present.     Cervical back: Normal range of motion and neck supple. No rigidity or tenderness.     Right lower leg: No edema.     Left lower leg: No edema.  Lymphadenopathy:     Cervical: No cervical adenopathy.  Skin:    General: Skin is warm and dry.     Coloration: Skin is not jaundiced.     Findings: No erythema or rash.  Neurological:     General: No focal deficit present.     Mental Status: He is alert and oriented to person,  place, and time.     Motor: Weakness present.     Gait: Gait abnormal.     Comments: Pt ambulates with rollator  Psychiatric:        Mood and Affect: Mood normal.        Behavior: Behavior normal.     Last CBC Lab Results  Component Value Date   WBC 17.0 (H) 08/15/2023   HGB 11.2 (L) 08/15/2023   HCT 37.0 (L) 08/15/2023   MCV 97.4 08/15/2023   MCH 29.5 08/15/2023   RDW 13.5 08/15/2023   PLT 371 08/15/2023   Last metabolic panel Lab Results  Component Value Date   GLUCOSE 175 (H) 08/15/2023   NA 138 08/15/2023   K 3.6 08/15/2023   CL 94 (L) 08/15/2023   CO2 33 (H) 08/15/2023   BUN 21 08/15/2023   CREATININE 0.62 08/15/2023   GFRNONAA >60 08/15/2023   CALCIUM 8.8 (L) 08/15/2023   PHOS 2.9 08/14/2023   PROT 6.5 08/14/2023   ALBUMIN 2.9 (L) 08/14/2023   LABGLOB 3.3 05/09/2023   LABGLOB 2.8 05/09/2023   BILITOT 0.4 08/14/2023   ALKPHOS 44 08/14/2023   AST 22 08/14/2023   ALT 16 08/14/2023   ANIONGAP 11 08/15/2023   Last  lipids Lab Results  Component Value Date   CHOL 135 04/13/2023   HDL 40 (L) 04/13/2023   LDLCALC 79 04/13/2023   TRIG 79 04/13/2023   CHOLHDL 3.4 04/13/2023   Last hemoglobin A1c Lab Results  Component Value Date   HGBA1C 6.9 (H) 05/09/2023   Last thyroid functions Lab Results  Component Value Date   TSH 1.280 05/09/2023   Last vitamin D No results found for: "25OHVITD2", "25OHVITD3", "VD25OH" Last vitamin B12 and Folate Lab Results  Component Value Date   VITAMINB12 >2000 (H) 12/01/2020      Assessment & Plan:  Essential hypertension -     Vitamin B1 -     Magnesium -     NT-proBNP  Paroxysmal atrial fibrillation (HCC) -     Magnesium -     NT-proBNP -     Apixaban; Take 1 tablet (5 mg total) by mouth 2 (two) times daily.  Dispense: 60 tablet; Refill: 1  Chronic systolic CHF (congestive heart failure) (HCC) -     NT-proBNP  Weight loss, unintentional -     CBC with Differential/Platelet -     TSH -     Comprehensive  metabolic panel -     Hemoglobin A1c  COPD with acute exacerbation (HCC) -     Breztri Aerosphere; Inhale 2 puffs into the lungs 2 (two) times daily.  Dispense: 10.7 g; Refill: 11 -     Esomeprazole Magnesium; Take 1 capsule (20 mg total) by mouth daily.  Dispense: 90 capsule; Refill: 2 -     Ipratropium Bromide; Take 2.5 mLs (0.5 mg total) by nebulization 3 (three) times daily.  Dispense: 75 mL; Refill: 12  Type 2 diabetes mellitus with hyperglycemia, without long-term current use of insulin (HCC) -     Lipid panel  Physical deconditioning -     VITAMIN D 25 Hydroxy (Vit-D Deficiency, Fractures)  Weakness of right foot -     B12 and Folate Panel -     Vitamin B6 -     Vitamin B1 -     Magnesium  Diabetic peripheral neuropathy (HCC) -     B12 and Folate Panel -     Hemoglobin A1c -     Vitamin B6 -     Vitamin B1 -     Magnesium  History of bladder cancer -     PSA  Other fatigue -     TSH -     VITAMIN D 25 Hydroxy (Vit-D Deficiency, Fractures) -     B12 and Folate Panel -     Hemoglobin A1c -     Magnesium  Mycobacterium avium complex (HCC) -     Ambulatory referral to Infectious Disease  Assessment and Plan    Chronic Obstructive Pulmonary Disease (COPD) 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 CT shows continuation of suspected MAC. Prior treatment with minocycline. Currently on Symbicort, Atrovent, and Xopenex as needed. -Refer to Infectious Disease for further management of suspected MAC; will likely need ethambutol and rifampin. Needs to avoid macrolides due to QT prolongation -If Markus Daft is covered, replace Symbicort and Atrovent with Breztri. -Continue Xopenex as needed.  Atrial Fibrillation On Eliquis for anticoagulation. -Continue Eliquis 5mg .  Gastroesophageal Reflux Disease (GERD) On Nexium 20mg  daily. -Continue Nexium 20mg  daily, take 30 minutes before breakfast.  Peripheral  Neuropathy History of neuropathy. Not currently on Gabapentin or Cymbalta due to side effects. -Check B12, folate, and  thiamine levels.  Weight Loss Lost 35 pounds in the last year, possibly related to COPD and suspected MAC infection. -Advise on calorically dense diet to prevent further weight loss.  Anxiety On Xanax 0.25mg  twice daily. -Continue Xanax 0.25mg  twice daily.  Type 2 Diabetes Mellitus On Metformin 500mg  daily. -Check A1c. -Continue Metformin 500mg  daily.  General Health Maintenance -Check ProBNP, thyroid function, and Vitamin D levels. -Continue over-the-counter B12 and elderberry as tolerated. -Consider checking thiamine level due to symptoms of glossitis and potential deficiency.      Total time spent face to face, including chart review and interpretation of outside records, documentation, and coordination of care was 60 minutes.  Return in about 2 weeks (around 09/24/2023).   Maretta Bees, PA

## 2023-09-11 ENCOUNTER — Ambulatory Visit: Payer: Medicare Other | Admitting: Student

## 2023-09-11 ENCOUNTER — Other Ambulatory Visit (HOSPITAL_BASED_OUTPATIENT_CLINIC_OR_DEPARTMENT_OTHER): Payer: Self-pay

## 2023-09-11 LAB — CBC WITH DIFFERENTIAL/PLATELET
Basophils Absolute: 0 10*3/uL (ref 0.0–0.1)
Basophils Relative: 0.5 % (ref 0.0–3.0)
Eosinophils Absolute: 0.3 10*3/uL (ref 0.0–0.7)
Eosinophils Relative: 4.2 % (ref 0.0–5.0)
HCT: 42.1 % (ref 39.0–52.0)
Hemoglobin: 13.4 g/dL (ref 13.0–17.0)
Lymphocytes Relative: 15.8 % (ref 12.0–46.0)
Lymphs Abs: 1.1 10*3/uL (ref 0.7–4.0)
MCHC: 32 g/dL (ref 30.0–36.0)
MCV: 94.3 fl (ref 78.0–100.0)
Monocytes Absolute: 0.6 10*3/uL (ref 0.1–1.0)
Monocytes Relative: 8.3 % (ref 3.0–12.0)
Neutro Abs: 5.1 10*3/uL (ref 1.4–7.7)
Neutrophils Relative %: 71.2 % (ref 43.0–77.0)
Platelets: 495 10*3/uL — ABNORMAL HIGH (ref 150.0–400.0)
RBC: 4.46 Mil/uL (ref 4.22–5.81)
RDW: 14.7 % (ref 11.5–15.5)
WBC: 7.2 10*3/uL (ref 4.0–10.5)

## 2023-09-11 LAB — COMPREHENSIVE METABOLIC PANEL
ALT: 11 U/L (ref 0–53)
AST: 12 U/L (ref 0–37)
Albumin: 3.4 g/dL — ABNORMAL LOW (ref 3.5–5.2)
Alkaline Phosphatase: 63 U/L (ref 39–117)
BUN: 13 mg/dL (ref 6–23)
CO2: 35 meq/L — ABNORMAL HIGH (ref 19–32)
Calcium: 8.9 mg/dL (ref 8.4–10.5)
Chloride: 96 meq/L (ref 96–112)
Creatinine, Ser: 0.83 mg/dL (ref 0.40–1.50)
GFR: 83.31 mL/min (ref >60.00)
Glucose, Bld: 105 mg/dL — ABNORMAL HIGH (ref 70–99)
Potassium: 3.8 meq/L (ref 3.5–5.1)
Sodium: 137 meq/L (ref 135–145)
Total Bilirubin: 0.4 mg/dL (ref 0.2–1.2)
Total Protein: 6.5 g/dL (ref 6.0–8.3)

## 2023-09-11 LAB — LIPID PANEL
Cholesterol: 118 mg/dL (ref 0–200)
HDL: 31.4 mg/dL — ABNORMAL LOW (ref >39.00)
LDL Cholesterol: 69 mg/dL (ref 0–99)
NonHDL: 86.59
Total CHOL/HDL Ratio: 4
Triglycerides: 89 mg/dL (ref 0.0–149.0)
VLDL: 17.8 mg/dL (ref 0.0–40.0)

## 2023-09-11 LAB — HEMOGLOBIN A1C: Hgb A1c MFr Bld: 6.4 % (ref 4.6–6.5)

## 2023-09-11 LAB — B12 AND FOLATE PANEL
Folate: 8.8 ng/mL (ref >5.9)
Vitamin B-12: >1537 pg/mL — ABNORMAL HIGH (ref 211–911)

## 2023-09-11 LAB — TSH: TSH: 1.36 u[IU]/mL (ref 0.35–5.50)

## 2023-09-11 LAB — VITAMIN D 25 HYDROXY (VIT D DEFICIENCY, FRACTURES): VITD: 81.52 ng/mL (ref 30.00–100.00)

## 2023-09-11 LAB — MAGNESIUM: Magnesium: 1.7 mg/dL (ref 1.5–2.5)

## 2023-09-11 LAB — PSA: PSA: 0.11 ng/mL (ref 0.10–4.00)

## 2023-09-11 MED ORDER — APIXABAN 5 MG PO TABS: 5.0000 mg | ORAL_TABLET | Freq: Two times a day (BID) | ORAL | 3 refills | Status: DC

## 2023-09-12 ENCOUNTER — Ambulatory Visit: Payer: Medicare Other | Admitting: Urgent Care

## 2023-09-12 ENCOUNTER — Encounter: Payer: Self-pay | Admitting: Urgent Care

## 2023-09-12 NOTE — Telephone Encounter (Signed)
 No further action needed at this time.

## 2023-09-17 ENCOUNTER — Ambulatory Visit: Admitting: Infectious Diseases

## 2023-09-21 LAB — NT-PROBNP: Pro B Natriuretic peptide (BNP): 609 pg/mL — ABNORMAL HIGH (ref <450)

## 2023-09-21 LAB — VITAMIN B6: Vitamin B6: 3.3 ng/mL (ref 2.1–21.7)

## 2023-09-24 ENCOUNTER — Encounter: Payer: Self-pay | Admitting: Neurology

## 2023-09-24 ENCOUNTER — Ambulatory Visit: Payer: Medicare Other | Admitting: Neurology

## 2023-09-24 VITALS — BP 139/80 | HR 76 | Ht 71.0 in | Wt 151.0 lb

## 2023-09-24 DIAGNOSIS — R29898 Other symptoms and signs involving the musculoskeletal system: Secondary | ICD-10-CM | POA: Diagnosis not present

## 2023-09-24 DIAGNOSIS — I639 Cerebral infarction, unspecified: Secondary | ICD-10-CM

## 2023-09-24 DIAGNOSIS — R269 Unspecified abnormalities of gait and mobility: Secondary | ICD-10-CM | POA: Diagnosis not present

## 2023-09-24 NOTE — Progress Notes (Signed)
 ASSESSMENT AND PLAN  Matthew Hall is a 78 y.o. male   Subacute onset of right foot drop, History of vasculitis with recent flareup since August 2024 History of chronic low back pain, radiating pain to right lower extremity  EMG nerve conduction study confirmed moderate severe axonal sensorimotor polyneuropathy, also findings suggestive of active right sciatic neuropathy versus bilateral lumbosacral radiculopathy, right worse than left,  MRI of lumbar spine    DIAGNOSTIC DATA (LABS, IMAGING, TESTING) - I reviewed patient records, labs, notes, testing and imaging myself where available.   MEDICAL HISTORY:  Matthew Hall, is a 78 year old male seen in request by Dr. Roda Shutters for electrodiagnostic study to evaluate his subacute onset right lower extremity weakness, gait abnormality, his primary care is from   Demetrios Isaacs, MD   History is obtained from the patient and review of electronic medical records. I personally reviewed pertinent available imaging films in PACS.   PMHx of  Anxiety, chronic insomnia Vasculitis, under the care of rheumatologist, on prednisone COPD, Diabetes Stroke  Around 2014, he developed rash from head to toe, skin biopsy confirmed vasculitis, was treated with doxycycline, and prednisone, rash gradually disappeared in 1 month  He also had baseline chronic low back pain, radiating pain to right lower extremity, but not actively  He is the main caregiver of his wife, who suffered multiple medical disease, at baseline he is very active, trimming his trees, cutting bushes, around 2022, he developed numbness tingling at feet, starting at the right side, later began to involving left foot, sensitive, occasionally shooting pain,  He was seen by Dr. Anne Hahn in May 2022, EMG nerve conduction study in July 2022 confirmed moderate axonal sensorimotor polyneuropathy, but lost follow-up since  In August 2024, he began to have flareups of rash again,  this time contained to bilateral foot, he was under the care of local rheumatologist Dr. 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, MRI of the brain showed evidence of intracranial atherosclerotic disease  He did have a history of atrial fibrillation, taking Coumadin, was switched to Eliquis  He continues to have significant right distal leg weakness, gait abnormality, began to rely on a walker since, there was no significant improvement  UPDATE September 24 2023: Multiple hospital admission since last visit in October 2024,  December 2024, admitted for 1 months of progressive worsening shortness of breath, cough with yellow sputum, multifocal pneumonia, with underlying COPD, bronchiectasis, sepsis present on admission, leukocytosis, low-grade fever, tachypnea, tachycardia, improved with chest PT, spirometry, breathing exercise, prolonged multiple antibiotic,  Hospital admission again in January 2025 for COPD with acute exacerbation, with acute hypoxic respiratory failure, received oxygen, IV Solu-Medrol,  Readmission February 2025 COPD with acute exacerbation, chronic systolic congestive heart failure ejection fraction of 40 to 45%,  He was discharged to [facility], wife recently had hospital admission, will be discharged to the same facility independent living, he was brought in by transportation today, could no longer drive due to her  dense right foot  numbness, weakness, now wear right ankle brace, rely on her walker, denies significant low back pain, denied bowel and bladder incontinence, he did not have MRI of the lumbar spine that was ordered few months ago  PHYSICAL EXAM:       09/24/2023    9:55 AM 09/10/2023    2:21 PM 08/17/2023   12:56 PM  Vitals with BMI  Height 5\' 11"  5\' 11"    Weight 151 lbs 138 lbs   BMI 21.07 19.26   Systolic 139 114 440  Diastolic 80 75 68  Pulse 76 86 91     PHYSICAL EXAMNIATION:  Gen: NAD, conversant, well nourised, well groomed                     Cardiovascular: Regular rate rhythm, no peripheral edema, warm, nontender. Eyes: Conjunctivae clear without exudates or hemorrhage Neck: Supple, no carotid bruits. Pulmonary: Clear to auscultation bilaterally   NEUROLOGICAL EXAM:  MENTAL STATUS: Speech/cognition: Awake, alert, oriented to history taking and casual conversation CRANIAL NERVES: CN II: Visual fields are full to confrontation. Pupils are round equal and briskly reactive to light. CN III, IV, VI: extraocular movement are normal. No ptosis. CN V: Facial sensation is intact to light touch CN VII: Face is symmetric with normal eye closure  CN VIII: Hearing is normal to causal conversation. CN IX, X: Phonation is normal. CN XI: Head turning and shoulder shrug are intact  MOTOR: No significant bilateral upper extremity proximal and distal muscle weakness  LE Hip Flexion Knee flexion Knee extension Ankle Dorsiflexion Eversion Ankle plantar Flexion Inversion  R 5 5 5 3 3 3 3   L 5 5 5 4 4 4 4      REFLEXES: Active absent at lower extremity  SENSORY: Length-dependent decreased light touch, pinprick, vibratory sensation, most noticeable at right lower extremity COORDINATION: There is no trunk or limb dysmetria noted.  GAIT/STANCE: Need push-up to get up from seated position, flared bilateral foot, right worse than left  REVIEW OF SYSTEMS:  Full 14 system review of systems performed and  notable only for as above All other review of systems were negative.   ALLERGIES: Allergies  Allergen Reactions   Simvastatin Rash   Cortisone Nausea And Vomiting   Albuterol Palpitations and Other (See Comments)    Pt is okay to take xopenex, albuterol raises his heart rate   Augmentin [Amoxicillin-Pot Clavulanate] Rash   Ciprofloxacin Rash   Contrast Media [Iodinated Contrast Media] Rash   Flagyl [Metronidazole] Rash   Latex Hives and Rash   Sulfa Antibiotics Rash    HOME MEDICATIONS: Current Outpatient Medications  Medication Sig Dispense Refill   ALPRAZolam (XANAX) 0.25 MG tablet Take 0.25 mg by mouth 2 (two) times daily as needed for anxiety or sleep.   3   apixaban (ELIQUIS) 5 MG TABS tablet Take 1 tablet (5 mg total) by mouth 2 (two) times daily. 60 tablet 1   apixaban (ELIQUIS) 5 MG TABS tablet Take 1 tablet (5 mg total) by mouth 2 (two) times daily. 180 tablet 3   Budeson-Glycopyrrol-Formoterol (BREZTRI AEROSPHERE) 160-9-4.8 MCG/ACT AERO Inhale 2 puffs into the lungs 2 (two) times daily. 10.7 g 11   Cyanocobalamin (VITAMIN B12 PO) Take 1 tablet by mouth daily.     ELDERBERRY PO Take 1 tablet by mouth daily.     esomeprazole (NEXIUM) 20 MG capsule Take 1 capsule (20 mg total) by mouth daily. 90 capsule 2   feeding  supplement (ENSURE ENLIVE / ENSURE PLUS) LIQD Take 237 mLs by mouth 2 (two) times daily between meals.     guaiFENesin (MUCINEX) 600 MG 12 hr tablet Take 1 tablet (600 mg total) by mouth 2 (two) times daily. 60 tablet 5   levalbuterol (XOPENEX) 0.63 MG/3ML nebulizer solution Take 3 mLs (0.63 mg total) by nebulization every 6 (six) hours as needed for wheezing or shortness of breath. 270 mL 5   metFORMIN (GLUCOPHAGE-XR) 500 MG 24 hr tablet Take 1 tablet (500 mg total) by mouth daily. 90 tablet 3   metoprolol succinate (TOPROL-XL) 25 MG 24 hr tablet TAKE 1 TABLET BY MOUTH TWICE A DAY 180 tablet 0   nystatin (MYCOSTATIN) 100000 UNIT/ML suspension Take 5 mLs (500,000  Units total) by mouth 4 (four) times daily. 60 mL 0   triamcinolone (NASACORT) 55 MCG/ACT AERO nasal inhaler Place 1 spray into the nose daily.     Acetaminophen (TYLENOL) 325 MG CAPS Take 1-2 capsules by mouth every 6 (six) hours as needed (pain).     ipratropium (ATROVENT) 0.02 % nebulizer solution Take 2.5 mLs (0.5 mg total) by nebulization 3 (three) times daily. (Patient not taking: Reported on 09/24/2023) 75 mL 12   No current facility-administered medications for this visit.    PAST MEDICAL HISTORY: Past Medical History:  Diagnosis Date   Anxiety    Aortic atherosclerosis (HCC)    Atrial fibrillation (HCC)    Basal cell carcinoma    Bladder cancer (HCC)    Bladder tumor    Chronic systolic heart failure (HCC)    Colon polyps    COPD (chronic obstructive pulmonary disease) (HCC)    Cutaneous vasculitis    Diabetes (HCC)    Diabetic peripheral neuropathy (HCC) 12/27/2020   Diverticulosis    Essential hypertension    GERD (gastroesophageal reflux disease)    History of pneumonia    Nephrolithiasis    Osteoarthritis    Right foot drop    Stroke [facility])     PAST SURGICAL HISTORY: Past Surgical History:  Procedure Laterality Date   BRONCHIAL WASHINGS  06/19/2023   Procedure: BRONCHIAL WASHINGS;  Surgeon: Steffanie Dunn, DO;  Location: [facility];  Service: Cardiopulmonary;;   COLONOSCOPY     Kidney stones removed  1996   SKIN CANCER EXCISION Left    2003   TRANSURETHRAL RESECTION OF BLADDER TUMOR  1996   VIDEO BRONCHOSCOPY N/A 06/19/2023   Procedure: VIDEO BRONCHOSCOPY WITHOUT FLUORO;  Surgeon: Steffanie Dunn, DO;  Location: [facility];  Service: Cardiopulmonary;  Laterality: N/A;    FAMILY HISTORY: Family History  Problem Relation Age of Onset   Diabetes Mother    CAD Mother    Emphysema Father    Diabetes Sister    Stroke Sister    Lung cancer Brother    Liver cancer Brother    Colon cancer Neg Hx     SOCIAL HISTORY: Social History   Socioeconomic  History   Marital status: Married    Spouse name: Not on file   Number of children: 3   Years of education: Not on file   Highest education level: Not on file  Occupational History   Occupation: Pipefilter/retired    Comment: [employer]  Tobacco Use   Smoking status: Former    Current packs/day: 0.00    Average packs/day: 1 pack/day for 30.0 years (30.0 ttl pk-yrs)    Types: Cigarettes    Start date: 07/06/1981    Quit date: 07/07/2011  Years since quitting: 12.2   Smokeless tobacco: Never  Vaping Use   Vaping status: Never Used  Substance and Sexual Activity   Alcohol use: No    Alcohol/week: 0.0 standard drinks of alcohol   Drug use: No   Sexual activity: Not Currently  Other Topics Concern   Not on file  Social History Narrative   Lives with wife   Caffeine- 1 cup a day   Right handed    Social Drivers of Health   Financial Resource Strain: Not on file  Food Insecurity: No Food Insecurity (08/12/2023)   Hunger Vital Sign    Worried About Running Out of Food in the Last Year: Never true    Ran Out of Food in the Last Year: Never true  Recent Concern: Food Insecurity - Food Insecurity Present (06/18/2023)   Hunger Vital Sign    Worried About Running Out of Food in the Last Year: Sometimes true    Ran Out of Food in the Last Year: Sometimes true  Transportation Needs: No Transportation Needs (08/12/2023)   PRAPARE - Administrator, Civil Service (Medical): No    Lack of Transportation (Non-Medical): No  Recent Concern: Transportation Needs - Unmet Transportation Needs (07/13/2023)   PRAPARE - Administrator, Civil Service (Medical): Yes    Lack of Transportation (Non-Medical): Yes  Physical Activity: Not on file  Stress: Not on file  Social Connections: Moderately Isolated (08/12/2023)   Social Connection and Isolation Panel [NHANES]    Frequency of Communication with Friends and Family: More than three times a week    Frequency of Social  Gatherings with Friends and Family: Once a week    Attends Religious Services: Never    Database administrator or Organizations: No    Attends Banker Meetings: Never    Marital Status: Married  Catering manager Violence: Not At Risk (08/12/2023)   Humiliation, Afraid, Rape, and Kick questionnaire    Fear of Current or Ex-Partner: No    Emotionally Abused: No    Physically Abused: No    Sexually Abused: No      Levert Feinstein, M.D. Ph.D.  [facility] [address], [address] [place], [place] [address] Ph: [phone] Fax: [phone]  CC:  Alinda Deem, MD [address] [place],  [place] [address]  Maretta Bees, [place]

## 2023-09-25 ENCOUNTER — Ambulatory Visit: Admitting: Urgent Care

## 2023-09-25 VITALS — BP 136/81 | HR 79 | Wt 153.1 lb

## 2023-09-25 DIAGNOSIS — I10 Essential (primary) hypertension: Secondary | ICD-10-CM

## 2023-09-25 DIAGNOSIS — I5022 Chronic systolic (congestive) heart failure: Secondary | ICD-10-CM

## 2023-09-25 DIAGNOSIS — I480 Paroxysmal atrial fibrillation: Secondary | ICD-10-CM | POA: Diagnosis not present

## 2023-09-25 DIAGNOSIS — Z7984 Long term (current) use of oral hypoglycemic drugs: Secondary | ICD-10-CM

## 2023-09-25 DIAGNOSIS — J449 Chronic obstructive pulmonary disease, unspecified: Secondary | ICD-10-CM

## 2023-09-25 DIAGNOSIS — R5381 Other malaise: Secondary | ICD-10-CM

## 2023-09-25 DIAGNOSIS — R269 Unspecified abnormalities of gait and mobility: Secondary | ICD-10-CM

## 2023-09-25 DIAGNOSIS — E1142 Type 2 diabetes mellitus with diabetic polyneuropathy: Secondary | ICD-10-CM

## 2023-09-25 NOTE — Progress Notes (Unsigned)
 Established Patient Office Visit  Subjective:  Patient ID: Matthew Hall, male    DOB: 12/17/1945  Age: 78 y.o. MRN: [id number]  Chief Complaint  Patient presents with   Follow-up    Follow up from last visit    HPI  Patient Active Problem List   Diagnosis Date Noted   Gait abnormality 09/24/2023   Prolonged QT interval 07/13/2023   DM2 (diabetes mellitus, type 2) (HCC) 07/13/2023   Paroxysmal atrial fibrillation (HCC) 07/13/2023   GAD (generalized anxiety disorder) 07/13/2023   Chronic systolic CHF (congestive heart failure) (HCC) 07/13/2023   SOB (shortness of breath) 07/13/2023   Malnutrition of moderate degree 07/13/2023   Acute respiratory failure with hypoxia (HCC) 07/12/2023   Pain due to onychomycosis of toenails of both feet 07/02/2023   Paronychia of great toe of left foot 07/02/2023   Bronchiectasis with (acute) exacerbation (HCC) 06/20/2023   Bronchiectasis with acute exacerbation (HCC) 06/19/2023   Pneumonia due to infectious organism 06/19/2023   COPD with acute exacerbation (HCC) 06/18/2023   Multifocal pneumonia 06/17/2023   Sepsis (HCC) 06/17/2023   Physical deconditioning 06/17/2023   Weakness of right foot 05/09/2023   Cerebrovascular accident (CVA) (HCC) 04/12/2023   Anxiety 04/12/2023   Diabetic peripheral neuropathy (HCC) 12/27/2020   Paresthesia 12/01/2020   Pneumonia due to COVID-19 virus 10/29/2020   Hypokalemia 10/29/2020   Healthcare maintenance 07/25/2019   Leukocytosis 08/11/2017   Elevated sed rate 05/21/2017   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) 03/01/2017   Vasculitis (HCC) 02/20/2017   Atrial fibrillation, chronic (HCC) 07/09/2016   BRONCHIECTASIS W/O ACUTE EXACERBATION 03/31/2008   Essential hypertension 09/13/2007   Allergic rhinitis 09/13/2007   EMPHYSEMA 09/13/2007   COPD exacerbation (HCC) 09/13/2007   Past Medical History:  Diagnosis Date   Anxiety    Aortic atherosclerosis (HCC)    Atrial fibrillation (HCC)    Basal  cell carcinoma    Bladder cancer (HCC)    Bladder tumor    Chronic systolic heart failure (HCC)    Colon polyps    COPD (chronic obstructive pulmonary disease) (HCC)    Cutaneous vasculitis    Diabetes (HCC)    Diabetic peripheral neuropathy (HCC) 12/27/2020   Diverticulosis    Essential hypertension    GERD (gastroesophageal reflux disease)    History of pneumonia    Nephrolithiasis    Osteoarthritis    Right foot drop    Stroke [facility])    Past Surgical History:  Procedure Laterality Date   BRONCHIAL WASHINGS  06/19/2023   Procedure: BRONCHIAL WASHINGS;  Surgeon: Steffanie Dunn, DO;  Location: [facility];  Service: Cardiopulmonary;;   COLONOSCOPY     Kidney stones removed  1996   SKIN CANCER EXCISION Left    2003   TRANSURETHRAL RESECTION OF BLADDER TUMOR  1996   VIDEO BRONCHOSCOPY N/A 06/19/2023   Procedure: VIDEO BRONCHOSCOPY WITHOUT FLUORO;  Surgeon: Steffanie Dunn, DO;  Location: [facility];  Service: Cardiopulmonary;  Laterality: N/A;   Social History   Tobacco Use   Smoking status: Former    Current packs/day: 0.00    Average packs/day: 1 pack/day for 30.0 years (30.0 ttl pk-yrs)    Types: Cigarettes    Start date: 07/06/1981    Quit date: 07/07/2011    Years since quitting: 12.2   Smokeless tobacco: Never  Vaping Use   Vaping status: Never Used  Substance Use Topics   Alcohol use: No    Alcohol/week: 0.0 standard drinks of alcohol  Drug use: No      ROS: as noted in HPI  Objective:     BP 136/81   Pulse 79   Wt 153 lb 1.9 oz (69.5 kg)   SpO2 95%   BMI 21.36 kg/m  BP Readings from Last 3 Encounters:  09/25/23 136/81  09/24/23 139/80  09/10/23 114/75   Wt Readings from Last 3 Encounters:  09/25/23 153 lb 1.9 oz (69.5 kg)  09/24/23 151 lb (68.5 kg)  09/10/23 138 lb (62.6 kg)      Physical Exam   No results found for any visits on 09/25/23.  Last CBC Lab Results  Component Value Date   WBC 7.2 09/10/2023   HGB 13.4 09/10/2023    HCT 42.1 09/10/2023   MCV 94.3 09/10/2023   MCH 29.5 08/15/2023   RDW 14.7 09/10/2023   PLT 495.0 (H) 09/10/2023   Last metabolic panel Lab Results  Component Value Date   GLUCOSE 105 (H) 09/10/2023   NA 137 09/10/2023   K 3.8 09/10/2023   CL 96 09/10/2023   CO2 35 (H) 09/10/2023   BUN 13 09/10/2023   CREATININE 0.83 09/10/2023   GFR 83.31 09/10/2023   CALCIUM 8.9 09/10/2023   PHOS 2.9 08/14/2023   PROT 6.5 09/10/2023   ALBUMIN 3.4 (L) 09/10/2023   LABGLOB 3.3 05/09/2023   LABGLOB 2.8 05/09/2023   BILITOT 0.4 09/10/2023   ALKPHOS 63 09/10/2023   AST 12 09/10/2023   ALT 11 09/10/2023   ANIONGAP 11 08/15/2023   Last lipids Lab Results  Component Value Date   CHOL 118 09/10/2023   HDL 31.40 (L) 09/10/2023   LDLCALC 69 09/10/2023   TRIG 89.0 09/10/2023   CHOLHDL 4 09/10/2023   Last hemoglobin A1c Lab Results  Component Value Date   HGBA1C 6.4 09/10/2023      The ASCVD Risk score (Arnett DK, et al., 2019) failed to calculate for the following reasons:   Risk score cannot be calculated because patient has a medical history suggesting prior/existing ASCVD  Assessment & Plan:  There are no diagnoses linked to this encounter.   No follow-ups on file.   Maretta Bees, PA

## 2023-09-25 NOTE — Patient Instructions (Addendum)
 Please continue your home meds as previously ordered.  As we discussed, you deferred the need for lasix, a fluid pill. 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  Continue therapy as ordered.  Please return for a recheck in 4-6 months.

## 2023-09-25 NOTE — Telephone Encounter (Signed)
Nothing further needed at this time. 

## 2023-09-26 ENCOUNTER — Encounter (HOSPITAL_BASED_OUTPATIENT_CLINIC_OR_DEPARTMENT_OTHER): Payer: Self-pay

## 2023-09-26 ENCOUNTER — Encounter: Payer: Self-pay | Admitting: Urgent Care

## 2023-09-27 ENCOUNTER — Encounter: Payer: Self-pay | Admitting: Internal Medicine

## 2023-09-27 ENCOUNTER — Other Ambulatory Visit: Payer: Self-pay

## 2023-09-27 ENCOUNTER — Ambulatory Visit: Admitting: Internal Medicine

## 2023-09-27 VITALS — BP 146/86 | HR 75 | Temp 97.5°F | Ht 71.0 in | Wt 150.0 lb

## 2023-09-27 DIAGNOSIS — J449 Chronic obstructive pulmonary disease, unspecified: Secondary | ICD-10-CM

## 2023-09-27 DIAGNOSIS — Z87891 Personal history of nicotine dependence: Secondary | ICD-10-CM

## 2023-09-27 DIAGNOSIS — R0600 Dyspnea, unspecified: Secondary | ICD-10-CM

## 2023-09-27 DIAGNOSIS — G587 Mononeuritis multiplex: Secondary | ICD-10-CM

## 2023-09-27 DIAGNOSIS — I776 Arteritis, unspecified: Secondary | ICD-10-CM

## 2023-09-27 NOTE — Progress Notes (Signed)
 [facility]  Reason for Consult:ntm lung Referring Provider: Guy Sandifer    Patient Active Problem List   Diagnosis Date Noted   Gait abnormality 09/24/2023   Prolonged QT interval 07/13/2023   DM2 (diabetes mellitus, type 2) (HCC) 07/13/2023   Paroxysmal atrial fibrillation (HCC) 07/13/2023   GAD (generalized anxiety disorder) 07/13/2023   Chronic systolic CHF (congestive heart failure) (HCC) 07/13/2023   SOB (shortness of breath) 07/13/2023   Malnutrition of moderate degree 07/13/2023   Acute respiratory failure with hypoxia (HCC) 07/12/2023   Pain due to onychomycosis of toenails of both feet 07/02/2023   Paronychia of great toe of left foot 07/02/2023   Bronchiectasis with (acute) exacerbation (HCC) 06/20/2023   Bronchiectasis with acute exacerbation (HCC) 06/19/2023   Pneumonia due to infectious organism 06/19/2023   COPD with acute exacerbation (HCC) 06/18/2023   Multifocal pneumonia 06/17/2023   Sepsis (HCC) 06/17/2023   Physical deconditioning 06/17/2023   Weakness of right foot 05/09/2023   Cerebrovascular accident (CVA) (HCC) 04/12/2023   Anxiety 04/12/2023   Diabetic peripheral neuropathy (HCC) 12/27/2020   Paresthesia 12/01/2020   Pneumonia due to COVID-19 virus 10/29/2020   Hypokalemia 10/29/2020   Healthcare maintenance 07/25/2019   Leukocytosis 08/11/2017   Elevated sed rate 05/21/2017   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) 03/01/2017   Vasculitis (HCC) 02/20/2017   Atrial fibrillation, chronic (HCC) 07/09/2016   BRONCHIECTASIS W/O ACUTE EXACERBATION 03/31/2008   Essential hypertension 09/13/2007   Allergic rhinitis 09/13/2007   EMPHYSEMA 09/13/2007   COPD exacerbation (HCC) 09/13/2007      HPI: Matthew Hall is a 78 y.o. male here for ntm evaluation  My summary of chart review and talking to patient #copd #bronchiectasis #hx tobacco use quit 2012 Atrovent neb prn Budeson-glycopyrrol-formoterol  inhaler Albuterol inhaler prn Triamcinolone nasocort Flutter valve  Ct 06/2023 bronchiectasis and extensive peribronchovascular nodularity 06/2023 BAL cx steno maltophilia; afb cx negative; fungal cx negative Hx recurrent exacerbation; recent exacerbation 07/2023 admission tx with abx for pna and prednisone taper See pulmonology dr Cyril Mourning Pft -- 10/2022  fvc 2.2 (51% pre); fev1 1.05 (33% pre); fev1/fvc 47%; fef 25-75 0.35 (16% 11/2017  fvc 1.78 (39%); fev1 0.94 (28%); fev1/fvc (53%)  Baseline activity: Walks with fww; prn oxygen supplement (today visit doesn't use oxygen) Walks flat ground 1/4 mile without getting short of breath 10 step of stairs slowly Takes care of wife as care giver Does laundry Doesn't think he can vacuum; haven't tried for the past year since 02/2023 since he had a stroke  No fever, chill, nightsweat No chronic cough unless exacerbation of copd/bronchiectasis (last time 07/2023) Lost 20 pounds over the past year since stroke but is gaining weight back Appetite so-so    #gerd Nexium  #chf  #afib EF 45%   #dm2 Mild On metformin   #concern for sleep apnea Pending sleep study   #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 Vasculitis on set 2014 resolved with pred Another flare 02/2023 also resolved with pred ?stroke sx around same time 02/2023 and right foot drop  W/u by neurology             "EMG nerve conduction study confirmed moderate severe axonal sensorimotor polyneuropathy, also findings suggestive of active right sciatic neuropathy versus bilateral lumbosacral radiculopathy, right worse than left"  He is followed by pulm for bronchiectasis/copd Recent flare 07/2023; steno; neg afb cx on bal Stable chest ct  Primary care concerned  with chest ct and wants id eval for ntm lung     Review of Systems: ROS Negative unless mentioned above      Past Medical History:  Diagnosis Date   Anxiety    Aortic atherosclerosis (HCC)    Atrial fibrillation (HCC)    Basal cell carcinoma    Bladder cancer (HCC)    Bladder tumor    Chronic systolic heart failure (HCC)    Colon polyps    COPD (chronic obstructive pulmonary disease) (HCC)    Cutaneous vasculitis    Diabetes (HCC)    Diabetic peripheral neuropathy (HCC) 12/27/2020   Diverticulosis    Essential hypertension    GERD (gastroesophageal reflux disease)    History of pneumonia    Nephrolithiasis    Osteoarthritis    Right foot drop    Stroke (HCC)     Social History   Tobacco Use   Smoking status: Former    Current packs/day: 0.00    Average packs/day: 1 pack/day for 30.0 years (30.0 ttl pk-yrs)    Types: Cigarettes    Start date: 07/06/1981    Quit date: 07/07/2011    Years since quitting: 12.2   Smokeless tobacco: Never  Vaping Use   Vaping status: Never Used  Substance Use Topics   Alcohol use: No    Alcohol/week: 0.0 standard drinks of alcohol   Drug use: No    Family History  Problem Relation Age of Onset   Diabetes Mother    CAD Mother    Emphysema Father    Diabetes Sister    Stroke Sister    Lung cancer Brother    Liver cancer Brother    Colon cancer Neg Hx     Allergies  Allergen Reactions   Simvastatin Rash   Cortisone Nausea And Vomiting   Albuterol Palpitations and Other (See Comments)    Pt is okay to take xopenex, albuterol raises his heart rate   Augmentin [Amoxicillin-Pot Clavulanate] Rash   Ciprofloxacin Rash   Contrast Media [Iodinated Contrast Media] Rash   Flagyl [Metronidazole] Rash   Latex Hives and Rash   Sulfa Antibiotics Rash    OBJECTIVE: Vitals:   09/27/23 0900  BP: (!) 146/86  Pulse: 75  Temp: (!) 97.5 F (36.4 C)  TempSrc: Oral  SpO2: 95%  Weight: 150 lb (68 kg)  Height: 5\' 11"  (1.803 m)    Body mass index is 20.92 kg/m.   Physical Exam General/constitutional: no distress, pleasant; walks with fww HEENT: Normocephalic, PER, Conj Clear, EOMI, Oropharynx clear Neck supple CV: rrr no mrg Lungs: clear to auscultation, normal respiratory effort Abd: Soft, Nontender Ext: no edema Skin: No Rash Neuro: nonfocal; right foot flexion 3/5 MSK: no peripheral joint swelling/tenderness/warmth; back spines nontender   Lab: Lab Results  Component Value Date   WBC 7.2 09/10/2023   HGB 13.4 09/10/2023   HCT 42.1 09/10/2023   MCV 94.3 09/10/2023   PLT 495.0 (H) 09/10/2023   Last metabolic panel Lab Results  Component Value Date   GLUCOSE 105 (H) 09/10/2023   NA 137 09/10/2023   K 3.8 09/10/2023   CL 96 09/10/2023   CO2 35 (H) 09/10/2023   BUN 13 09/10/2023   CREATININE 0.83 09/10/2023   GFR 83.31 09/10/2023   CALCIUM 8.9 09/10/2023   PHOS 2.9 08/14/2023   PROT 6.5 09/10/2023  ALBUMIN 3.4 (L) 09/10/2023   LABGLOB 3.3 05/09/2023   LABGLOB 2.8 05/09/2023   BILITOT 0.4 09/10/2023   ALKPHOS 63 09/10/2023   AST 12 09/10/2023   ALT 11 09/10/2023   ANIONGAP 11 08/15/2023    Microbiology:  Serology:  Imaging: Reviewed  06/2023 chest ct similar to 09/2022 Bronchiectasis with mucoid impaction and extensive peribronchovascular nodularity again noted in similar to prior study. Compatible with chronic MAC.   Trace right pleural effusion.   Coronary artery disease.  Assessment/plan: Problem List Items Addressed This Visit     Vasculitis (HCC) - Primary   Other Visit Diagnoses       Mononeuritis multiplex         Chronic obstructive pulmonary disease, unspecified COPD type (HCC)       Relevant Orders   CT CHEST WO CONTRAST     History of tobacco use         Dyspnea, unspecified type             Patient with vasculitis/mononeuritis multiplex, copd, bronchiectasis, hx cva, chf, gerd  Patient with recent bal 06/2023 afb cx negative  He has been  stable with pft for the past several years  He has had stable baseline functional status since 02/2023 stroke  No b sx or chronic cough  Imaging consistent with bronchiectasis vs ntm lung disease but currently based on data no microbiologic evidence of ntm and wouldn't meet clinical criteria   Other thing to keep in mind is gerd and also vasculitis hx. The former definitely can cause lung changes with aspiration especially with stroke hx. The latter if a systemic process can cause pulm nodules.    I would repeat chest ct within the next 4 months and see about getting surveillance afb culture. Patient don't think he can cough up any sputum. In this setting will just plan repeat ct chest and also if he has an exacerbation will plan AFB culture at that time  Follow up with me in 6 months  F/u pulm dr Cyril Mourning in the next several weeks -- will ask him to evaluate for non-infectious cause of ct chest changes again in setting rheumatologic history and gerd/stroke aspiration concern     Follow-up: Return in about 6 months (around 03/29/2024).  Raymondo Band, MD [facility] 09/27/2023, 9:12 AM

## 2023-09-27 NOTE — Patient Instructions (Addendum)
 You have copd/bronchiectasis and chest ct supporting such   Your primary care provider sends you to me to make sure you don't have an atypical pneumonia with something called Non-tuberculous mycobacterial infection   At this time doesn't appear you do have that particular infection, but it would be prudent to repeat chest ct within 4 months and potentially get sputum culture for that bacteria to see if it is present   I'll discuss with your lung doctor to plan this   Please see me again in 6 months

## 2023-10-01 ENCOUNTER — Other Ambulatory Visit: Payer: Self-pay

## 2023-10-01 ENCOUNTER — Encounter (HOSPITAL_BASED_OUTPATIENT_CLINIC_OR_DEPARTMENT_OTHER): Payer: Self-pay | Admitting: Adult Health

## 2023-10-01 ENCOUNTER — Ambulatory Visit (HOSPITAL_BASED_OUTPATIENT_CLINIC_OR_DEPARTMENT_OTHER): Payer: Medicare Other | Admitting: Adult Health

## 2023-10-01 ENCOUNTER — Encounter (HOSPITAL_BASED_OUTPATIENT_CLINIC_OR_DEPARTMENT_OTHER): Payer: Self-pay

## 2023-10-01 ENCOUNTER — Other Ambulatory Visit (HOSPITAL_BASED_OUTPATIENT_CLINIC_OR_DEPARTMENT_OTHER): Payer: Self-pay

## 2023-10-01 VITALS — BP 124/78 | HR 81 | Ht 71.0 in | Wt 149.6 lb

## 2023-10-01 DIAGNOSIS — J449 Chronic obstructive pulmonary disease, unspecified: Secondary | ICD-10-CM | POA: Diagnosis not present

## 2023-10-01 MED ORDER — LEVALBUTEROL TARTRATE 45 MCG/ACT IN AERO
2.0000 | INHALATION_SPRAY | Freq: Four times a day (QID) | RESPIRATORY_TRACT | 3 refills | Status: AC | PRN
  Filled 2023-10-01: qty 15, 25d supply, fill #0
  Filled 2023-10-01: qty 15, 30d supply, fill #0
  Filled 2024-05-27: qty 15, 25d supply, fill #1
  Filled 2024-07-14: qty 15, 25d supply, fill #2

## 2023-10-01 MED ORDER — OMEPRAZOLE 40 MG PO CPDR
DELAYED_RELEASE_CAPSULE | ORAL | 0 refills | Status: DC
  Filled 2023-10-01: qty 90, 90d supply, fill #0

## 2023-10-01 NOTE — Progress Notes (Signed)
 @Patient  ID: Matthew Hall, male    DOB: 1945-08-28, 78 y.o.   MRN: [id number]  No chief complaint on file. Discussed the use of AI scribe software for clinical note transcription with the patient, who gave verbal consent to proceed.  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  CT abdomen from 2019 shows right lower lobe bronchiectasis   CT chest 2009 showed >> RLL & lingular bronchectasis & areas of pleuro-parenchymal scarring. 8 mm low density lesion in liver >> stable on rpt CT 01/2008. Incidentally, not seen on CT in 1995.   FEV1 trend '95 59% >> 64% in 09/2007 >> 59% in 03/2008     09/2014  FEv1 40%, ratio 62 07/2015 FEV1 40%   PFTs 10/2022 severe airway obstruction, ratio 47, FEV1 33%, TLC normal, DLCO 50%   Anti-trypsin nml   CT chest June 17, 2023 showed bronchiectasis with mucoid impaction and extensive peribronchovascular nodularity.   Underwent bronchoscopy with BAL June 19, 2023.   Culture showed Stenotrophomonas maltophilia treated with a 2 week course of minocycline.  10/01/2023 Follow up : COPD, bronchiectasis , post hospital follow up , O2 RF  Matthew Hall is a 78 year old male with COPD and bronchiectasis who presents for follow-up after a recent hospitalization for suspected influenza with COPD/Bronchiectasis exacerbation.    He was hospitalized last month for suspected influenza with a COPD exacerbation.  Viral panel was equivocal for influenza A.  Patient had acute viral symptoms and was treated for COPD exacerbation.   He has a history of  COPD and bronchiectasis and is currently using Breztri inhaler twice daily, which he finds helpful and less cumbersome compared to nebulizers. He also uses a Xopenex nebulizer as needed .  Was previously on nebulized medications but has recently stopped them.  He performs flutter valve exercises two to three times daily.  Patient was started on oxygen at discharge.  He is on 2 L  He has a history of aspiration pneumonia.  . A modified barium swallow study done on August 29, 2023 showed no aspiration.  Did have significant delay of the tablet at the GE junction required several swallows to pass.     He also has a history of vasculitis, previously treated with doxycycline and steroids, and was told it might be related to simvastatin use. He has been on warfarin due to AFib and has a history of stroke, which resulted in foot drop.  He resides in independent living at [facility] and has a history of smoking, which he has since quit. His wife is currently in rehab for a broken ankle.     Patient was admitted in October 2024 with acute stroke.  He has right foot drop.  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  Patient was recommended for a split-night sleep study which is still pending due to insurance issues.  He has snoring and daytime sleepiness.  Patient is on oxygen 2 L.  After recent hospitalization.  Has trouble using E tanks as it is hard for his balance to carry tanks and walk.  Needs a portable oxygen concentrator.  Patient walk test today in the office shows O2 saturations at 86% on room air.  Required 2 L of pulsed oxygen to maintain O2 saturations greater than 88 to 90% on POC device.  DME: [employer]   POC SATURATION QUALIFICATIONS: (This note is used to comply with regulatory documentation for home oxygen)   Patient Saturations on Room Air at Rest = 86%   Patient Saturations on Room Air while Ambulating =86%   Patient  Saturations on 2L POC while Ambulating = 97%   Please briefly explain why patient needs home oxygen: to maintain SATS above 90%  Allergies  Allergen Reactions   Simvastatin Rash   Cortisone Nausea And Vomiting   Albuterol Palpitations and Other (See Comments)    Pt is okay to take xopenex, albuterol raises his heart rate   Augmentin [Amoxicillin-Pot Clavulanate] Rash   Ciprofloxacin Rash   Contrast Media [Iodinated Contrast Media] Rash   Flagyl [Metronidazole] Rash   Latex Hives and Rash   Sulfa Antibiotics Rash    Immunization History  Administered Date(s) Administered   Influenza Split 05/08/2017   Influenza Whole 06/05/1995, 03/31/2008   Influenza,inj,Quad PF,6+ Mos 04/20/2016   Influenza-Unspecified 04/09/2014, 04/19/2015, 04/09/2018, 04/10/2023   Moderna SARS-COV2 Booster Vaccination 05/08/2020   PNEUMOCOCCAL CONJUGATE-20 07/11/2022   Pneumococcal Conjugate-13 09/03/2013, 04/09/2014, 03/01/2016, 04/09/2017   Pneumococcal Polysaccharide-23 05/02/1994, 08/07/2000   Rabies, IM 12/07/2014, 12/10/2014, 12/14/2014, 12/21/2014   Rsv, Bivalent, Protein Subunit Rsvpref,pf Verdis Frederickson) 06/16/2022   Tdap 02/04/2017   Zoster Recombinant(Shingrix) 04/10/2015, 04/09/2017   Zoster, Live 08/10/2013    Past Medical History:  Diagnosis Date   Anxiety    Aortic atherosclerosis (HCC)    Atrial fibrillation (HCC)    Basal cell carcinoma    Bladder cancer (HCC)    Bladder tumor    Chronic systolic heart failure (HCC)    Colon polyps    COPD (chronic obstructive pulmonary disease) (HCC)    Cutaneous vasculitis    Diabetes (HCC)    Diabetic peripheral neuropathy (HCC) 12/27/2020   Diverticulosis    Essential hypertension    GERD (gastroesophageal reflux disease)    History of pneumonia    Nephrolithiasis    Osteoarthritis    Right foot drop    Stroke (HCC)     Tobacco History: Social History   Tobacco Use  Smoking Status Former   Current packs/day: 0.00   Average packs/day:  1 pack/day for 30.0 years (30.0 ttl pk-yrs)   Types: Cigarettes   Start date: 07/06/1981   Quit date: 07/07/2011   Years since quitting: 12.2  Smokeless Tobacco Never   Counseling given: Not Answered   Outpatient Medications Prior to Visit  Medication Sig Dispense Refill   Acetaminophen (TYLENOL) 325 MG CAPS Take 1-2 capsules by mouth every 6 (six) hours as needed (pain).     ALPRAZolam (XANAX) 0.25 MG tablet Take 0.25 mg by mouth 2 (two) times daily as needed for anxiety or sleep.   3   apixaban (ELIQUIS) 5 MG TABS tablet Take 1 tablet (5 mg total) by mouth 2 (two) times daily. 60 tablet 1   Budeson-Glycopyrrol-Formoterol (BREZTRI AEROSPHERE) 160-9-4.8 MCG/ACT AERO Inhale 2 puffs into the lungs 2 (two) times daily. 10.7 g 11   Cyanocobalamin (VITAMIN B12 PO) Take 1 tablet by mouth daily.     ELDERBERRY PO Take 1 tablet by mouth daily.     feeding supplement (ENSURE ENLIVE / ENSURE PLUS) LIQD Take 237 mLs by mouth 2 (two) times daily between meals.  levalbuterol (XOPENEX) 0.63 MG/3ML nebulizer solution Take 3 mLs (0.63 mg total) by nebulization every 6 (six) hours as needed for wheezing or shortness of breath. 270 mL 5   metFORMIN (GLUCOPHAGE-XR) 500 MG 24 hr tablet Take 1 tablet (500 mg total) by mouth daily. 90 tablet 3   metoprolol succinate (TOPROL-XL) 25 MG 24 hr tablet TAKE 1 TABLET BY MOUTH TWICE A DAY 180 tablet 0   omeprazole (PRILOSEC) 20 MG capsule Take 1 capsule (20 mg total) by mouth daily. 90 capsule 2   triamcinolone (NASACORT) 55 MCG/ACT AERO nasal inhaler Place 1 spray into the nose daily.     guaiFENesin (MUCINEX) 600 MG 12 hr tablet Take 1 tablet (600 mg total) by mouth 2 (two) times daily. (Patient not taking: Reported on 10/01/2023) 60 tablet 5   nystatin (MYCOSTATIN) 100000 UNIT/ML suspension Take 5 mLs (500,000 Units total) by mouth 4 (four) times daily. (Patient not taking: Reported on 10/01/2023) 60 mL 0   ipratropium (ATROVENT) 0.02 % nebulizer solution Take 2.5  mLs (0.5 mg total) by nebulization 3 (three) times daily. (Patient not taking: Reported on 09/24/2023) 75 mL 12   No facility-administered medications prior to visit.     Review of Systems:   Constitutional:   No  weight loss, night sweats,  Fevers, chills,+ fatigue, or  lassitude.  HEENT:   No headaches,  Difficulty swallowing,  Tooth/dental problems, or  Sore throat,                No sneezing, itching, ear ache, nasal congestion, post nasal drip,   CV:  No chest pain,  Orthopnea, PND, swelling in lower extremities, anasarca, dizziness, palpitations, syncope.   GI  No heartburn, indigestion, abdominal pain, nausea, vomiting, diarrhea, change in bowel habits, loss of appetite, bloody stools.   Resp:  No chest wall deformity  Skin: no rash or lesions.  GU: no dysuria, change in color of urine, no urgency or frequency.  No flank pain, no hematuria   MS:  No joint pain or swelling.  No back pain.    Physical Exam  BP 124/78   Pulse 81   Ht 5\' 11"  (1.803 m)   Wt 149 lb 9.6 oz (67.9 kg)   SpO2 97%   BMI 20.86 kg/m   GEN: A/Ox3; pleasant , NAD,    HEENT:  [place]/AT,  EACs-clear, TMs-wnl, NOSE-clear, THROAT-clear, no lesions, no postnasal drip or exudate noted.   NECK:  Supple w/ fair ROM; no JVD; normal carotid impulses w/o bruits; no thyromegaly or nodules palpated; no lymphadenopathy.    RESP  Clear  P & A; w/o, wheezes/ rales/ or rhonchi. no accessory muscle use, no dullness to percussion  CARD:  RRR, no m/r/g, no peripheral edema, pulses intact, no cyanosis or clubbing.  GI:   Soft & nt; nml bowel sounds; no organomegaly or masses detected.   Musco: Warm bil, no deformities or joint swelling noted.   Neuro: alert, no focal deficits noted.    Skin: Warm, no lesions or rashes    Lab Results:    Imaging: No results found.  Administration History     None          Latest Ref Rng & Units 10/10/2022    2:57 PM 11/30/2017    1:26 PM  PFT Results  FVC-Pre L  2.20  1.78  P  FVC-Predicted Pre % 51  39  P  FVC-Post L 2.23  1.83  P  FVC-Predicted Post %  52  40  P  Pre FEV1/FVC % % 47  53  P  Post FEV1/FCV % % 49  55  P  FEV1-Pre L 1.05  0.94  P  FEV1-Predicted Pre % 33  28  P  FEV1-Post L 1.09  1.01  P  DLCO uncorrected ml/min/mmHg 12.64    DLCO UNC% % 50    DLVA Predicted % 98    TLC L 6.93    TLC % Predicted % 95    RV % Predicted % 178      P Preliminary result    No results found for: "NITRICOXIDE"      Assessment & Plan:   Assessment and Plan    Chronic Obstructive Pulmonary Disease (COPD) with recent exacerbation   The recent exacerbation was likely due to influenza A/viral illness , leading to  hospitalization for hypoxemia. No pneumonia was found on x-ray. He is currently using the Breztri inhaler twice daily, Xopenex is used as needed for acute symptoms,  Continue Breztri inhaler twice daily and use Xopenex inhaler as needed. Discontinue ipratropium. Use a flutter valve two to three times daily.   Bronchiectasis    CT in December showed bronchiectasis with mucoid impaction and extensive peribronchovascular nodularity,  Modified barium swallow shows no overt aspiration.  However patient is at risk with previous strokes.  Also has a history of aspiration pneumonia.  Aspiration precautions discussed in detail .  Patient did have a delay in tablet passing on MBS.  Could refer to GI if he develops ongoing swallow issues.  Bronchoscopy with BAL December 2024 AFB cultures were negative.  Fungal cultures were negative.  Culture was positive for  Stenotrophomonas maltophilia .  He was treated with 2 weeks of minocycline. Continue management with Breztri and Xopenex. Use a flutter valve two to three times daily.  Sleep apnea (suspected)   A sleep study has been pending for over two months, causing frustration. It is necessary to confirm the diagnosis and guide treatment. Check the status of the sleep study.  Lymphocytic vasculitis    Obtain records from rheumatologist  Pending these results may need to further evaluate for underlying connective tissue/autoimmune involvement in the lung.    Chronic respiratory failure.  Patient was started on oxygen during recent hospitalization.  Continues to have exertional desaturations.  Needs a portable oxygen concentrator.  Order was sent to DME.  Patient is to continue on oxygen 2 L with activity.    Follow-up   Follow-up Dr. Vassie Loll in 6 weeks.      I spent  43  minutes dedicated to the care of this patient on the date of this encounter to include pre-visit review of records, face-to-face time with the patient discussing conditions above, post visit ordering of testing, clinical documentation with the electronic health record, making appropriate referrals as documented, and communicating necessary findings to members of the patients care team.    Rubye Oaks, NP 10/01/2023

## 2023-10-01 NOTE — Patient Instructions (Addendum)
 Continue on Breztri 2 puffs Twice daily, rinse after use.  Xopenex inhaler or neb As needed   Aspiration precautions as discussed.  Flutter valve Three times a day   Check on split night sleep study. Continue on Oxygen 2l/m  Order for POC device  Rheumatology records.  Follow up with Dr. Vassie Loll  in 6 weeks and As needed   Please contact office for sooner follow up if symptoms do not improve or worsen or seek emergency care

## 2023-10-02 ENCOUNTER — Other Ambulatory Visit: Payer: Self-pay

## 2023-10-02 ENCOUNTER — Telehealth: Payer: Self-pay

## 2023-10-02 ENCOUNTER — Ambulatory Visit: Admitting: Urgent Care

## 2023-10-02 ENCOUNTER — Encounter: Payer: Self-pay | Admitting: Urgent Care

## 2023-10-02 ENCOUNTER — Other Ambulatory Visit (HOSPITAL_COMMUNITY): Payer: Self-pay

## 2023-10-02 ENCOUNTER — Ambulatory Visit: Payer: Self-pay

## 2023-10-02 DIAGNOSIS — F419 Anxiety disorder, unspecified: Secondary | ICD-10-CM

## 2023-10-02 DIAGNOSIS — R410 Disorientation, unspecified: Secondary | ICD-10-CM

## 2023-10-02 DIAGNOSIS — R4189 Other symptoms and signs involving cognitive functions and awareness: Secondary | ICD-10-CM

## 2023-10-02 NOTE — Addendum Note (Signed)
 Addended by: Amada Kingfisher on: 10/02/2023 08:44 AM   Modules accepted: Orders

## 2023-10-02 NOTE — Telephone Encounter (Signed)
*  Pulm  Pharmacy Patient Advocate Encounter   Received notification from CoverMyMeds that prior authorization for Levalbuterol Tartrate 45MCG/ACT aerosol  is required/requested.   Insurance verification completed.   The patient is insured through [employer] .   Per test claim: PA required; PA submitted to above mentioned insurance via Phone Key/confirmation #/EOC N/A Status is pending   Spoke with representative who initiated PA request on their end, sent to the Clinical Determination team who will contact me at my fax # [phone]) if any additional questions are needed.  Patient has an allergy to preferred albuterol hfa due to palpitations

## 2023-10-02 NOTE — Telephone Encounter (Signed)
 Chief Complaint: Elevated blood pressures Symptoms: Elevated blood pressures Frequency: Constant today Pertinent Negatives: Patient denies any symptoms Disposition: [] ED /[] Urgent Care (no appt availability in office) / [x] Appointment(In office/virtual)/ []  [place] [facility]/ [] Home Care/ [] Refused Recommended Disposition /[] [facility]/ []  Follow-up with PCP Additional Notes: Patient's daughter called and stated she was informed by PT that the patient's blood pressure has been elevated all day since around 1130am. Patient no longer on blood pressure medication although he has a history of hypertension, A-fib and CHF according to Deputy. EMS on site did an EKG and stated it looks fine. Care advice was given and patient has been schedule to be seen by PCP tomorrow morning. Advised if patient develops symptoms to callback for additional advice. [name] verbalized understanding.  Copied from CRM [id number]. Topic: Clinical - Red Word Triage >> Oct 02, 2023  4:35 PM Almira Coaster wrote: Red Word that prompted transfer to Nurse Triage: Elevated blood pressure, not other symptoms. Blood pressure 185/103. Reason for Disposition  Systolic BP  >= 180 OR Diastolic >= 110  Answer Assessment - Initial Assessment Questions 1. BLOOD PRESSURE: "What is the blood pressure?" "Did you take at least two measurements 5 minutes apart?"    153/90 at 11:30am,  174/108, 185/103 , 172/96 now  2. ONSET: "When did you take your blood pressure?"     Today  3. HOW: "How did you take your blood pressure?" (e.g., automatic home BP monitor, visiting nurse)     EMS on site  4. HISTORY: "Do you have a history of high blood pressure?"     CHF, Hypertension, A-fib, Stroke in October 2024  5. MEDICINES: "Are you taking any medicines for blood pressure?" "Have you missed any doses recently?"     No longer on medication  6. OTHER SYMPTOMS: "Do you have any symptoms?" (e.g., blurred vision, chest pain, difficulty breathing,  headache, weakness)     No  Protocols used: Blood Pressure - High-A-AH

## 2023-10-03 ENCOUNTER — Other Ambulatory Visit: Payer: Self-pay

## 2023-10-03 ENCOUNTER — Other Ambulatory Visit (HOSPITAL_BASED_OUTPATIENT_CLINIC_OR_DEPARTMENT_OTHER): Payer: Self-pay

## 2023-10-03 ENCOUNTER — Ambulatory Visit: Admitting: Urgent Care

## 2023-10-03 VITALS — BP 168/95 | HR 75 | Wt 152.0 lb

## 2023-10-03 DIAGNOSIS — R0981 Nasal congestion: Secondary | ICD-10-CM

## 2023-10-03 DIAGNOSIS — R0989 Other specified symptoms and signs involving the circulatory and respiratory systems: Secondary | ICD-10-CM | POA: Diagnosis not present

## 2023-10-03 DIAGNOSIS — R052 Subacute cough: Secondary | ICD-10-CM | POA: Diagnosis not present

## 2023-10-03 DIAGNOSIS — J449 Chronic obstructive pulmonary disease, unspecified: Secondary | ICD-10-CM

## 2023-10-03 DIAGNOSIS — I482 Chronic atrial fibrillation, unspecified: Secondary | ICD-10-CM

## 2023-10-03 MED ORDER — HYDROCHLOROTHIAZIDE 12.5 MG PO TABS
12.5000 mg | ORAL_TABLET | ORAL | 3 refills | Status: AC | PRN
  Filled 2023-10-03: qty 30, 30d supply, fill #0
  Filled 2023-12-14: qty 30, 30d supply, fill #1
  Filled 2024-02-28: qty 30, 30d supply, fill #2
  Filled 2024-02-28 – 2024-05-27 (×2): qty 30, 30d supply, fill #0

## 2023-10-03 MED ORDER — TRIAMCINOLONE ACETONIDE 55 MCG/ACT NA AERO
1.0000 | INHALATION_SPRAY | Freq: Every day | NASAL | 5 refills | Status: AC
Start: 2023-10-03 — End: ?
  Filled 2023-10-03: qty 16.9, 60d supply, fill #0

## 2023-10-03 MED ORDER — GUAIFENESIN ER 600 MG PO TB12
600.0000 mg | ORAL_TABLET | Freq: Two times a day (BID) | ORAL | 5 refills | Status: AC
  Filled 2023-10-03: qty 60, 30d supply, fill #0
  Filled 2023-10-15 – 2023-11-28 (×3): qty 60, 30d supply, fill #1
  Filled 2023-12-12 – 2023-12-21 (×2): qty 60, 30d supply, fill #2
  Filled 2024-01-16: qty 60, 30d supply, fill #3
  Filled 2024-02-21: qty 60, 30d supply, fill #4
  Filled 2024-02-26: qty 44, 22d supply, fill #4
  Filled 2024-02-28: qty 60, 30d supply, fill #4
  Filled 2024-02-28: qty 60, 30d supply, fill #0
  Filled 2024-03-24: qty 60, 30d supply, fill #1

## 2023-10-03 NOTE — Progress Notes (Unsigned)
   Established Patient Office Visit  Subjective:  Patient ID: Matthew Hall, male    DOB: Feb 10, 1946  Age: 78 y.o. MRN: [id number]  Chief Complaint  Patient presents with   Hypertension    Pt has been having elevated BP for the past few days . Pt was seen by ambulance and received an EKG.    HPI  {History (Optional):23778}  ROS: as noted in HPI  Objective:     BP (!) 168/95   Pulse 75   Wt 152 lb (68.9 kg)   SpO2 93%   BMI 21.20 kg/m  {Vitals History (Optional):23777}  Physical Exam   No results found for any visits on 10/03/23.  {Labs (Optional):23779}  The ASCVD Risk score (Arnett DK, et al., 2019) failed to calculate for the following reasons:   Risk score cannot be calculated because patient has a medical history suggesting prior/existing ASCVD  Assessment & Plan:  There are no diagnoses linked to this encounter.   No follow-ups on file.   Maretta Bees, PA

## 2023-10-03 NOTE — Telephone Encounter (Signed)
 Fax for additional information received from plan, form completed and faxed back to plan. Pending determination.

## 2023-10-03 NOTE — Telephone Encounter (Signed)
 No further action needed.

## 2023-10-03 NOTE — Patient Instructions (Addendum)
 Take your hydrochlorothiazide for BP readings > 145/90 Take one per day AS NEEDED.  This will also help with ankle swelling. It may cause increased urination. Send me your blood pressure readings in the next 10 days.   Please rinse your mouth out after each use of Breztri. This will prevent thrush.  Schedule your follow up with cardiology. Keep your appointment with me in July, but return sooner as needed

## 2023-10-03 NOTE — Telephone Encounter (Signed)
 Approved through 10/02/2024

## 2023-10-04 ENCOUNTER — Encounter: Payer: Self-pay | Admitting: Urgent Care

## 2023-10-04 ENCOUNTER — Other Ambulatory Visit (HOSPITAL_BASED_OUTPATIENT_CLINIC_OR_DEPARTMENT_OTHER): Payer: Self-pay

## 2023-10-05 ENCOUNTER — Other Ambulatory Visit: Payer: Self-pay

## 2023-10-08 ENCOUNTER — Other Ambulatory Visit (HOSPITAL_BASED_OUTPATIENT_CLINIC_OR_DEPARTMENT_OTHER): Payer: Self-pay

## 2023-10-09 ENCOUNTER — Other Ambulatory Visit (HOSPITAL_COMMUNITY): Payer: Self-pay

## 2023-10-10 ENCOUNTER — Ambulatory Visit (HOSPITAL_BASED_OUTPATIENT_CLINIC_OR_DEPARTMENT_OTHER): Payer: Medicare Other | Admitting: Pulmonary Disease

## 2023-10-12 ENCOUNTER — Other Ambulatory Visit (HOSPITAL_COMMUNITY): Payer: Self-pay

## 2023-10-12 ENCOUNTER — Other Ambulatory Visit (HOSPITAL_BASED_OUTPATIENT_CLINIC_OR_DEPARTMENT_OTHER): Payer: Self-pay

## 2023-10-12 ENCOUNTER — Other Ambulatory Visit: Payer: Self-pay

## 2023-10-13 ENCOUNTER — Other Ambulatory Visit (HOSPITAL_BASED_OUTPATIENT_CLINIC_OR_DEPARTMENT_OTHER): Payer: Self-pay

## 2023-10-15 ENCOUNTER — Other Ambulatory Visit: Payer: Self-pay | Admitting: Nurse Practitioner

## 2023-10-15 ENCOUNTER — Other Ambulatory Visit (HOSPITAL_COMMUNITY): Payer: Self-pay

## 2023-10-15 ENCOUNTER — Other Ambulatory Visit: Payer: Self-pay

## 2023-10-15 MED ORDER — METOPROLOL SUCCINATE ER 25 MG PO TB24
25.0000 mg | ORAL_TABLET | Freq: Two times a day (BID) | ORAL | 0 refills | Status: DC
  Filled 2023-10-15: qty 180, 90d supply, fill #0
  Filled 2023-10-16: qty 60, 30d supply, fill #0
  Filled 2023-11-28 (×2): qty 60, 30d supply, fill #1
  Filled 2023-12-12 – 2023-12-21 (×2): qty 60, 30d supply, fill #2

## 2023-10-16 ENCOUNTER — Other Ambulatory Visit: Payer: Self-pay

## 2023-10-16 ENCOUNTER — Telehealth (HOSPITAL_BASED_OUTPATIENT_CLINIC_OR_DEPARTMENT_OTHER): Payer: Self-pay | Admitting: Pulmonary Disease

## 2023-10-16 NOTE — Telephone Encounter (Signed)
 Will await fax to sign as I have not received a fax for this

## 2023-10-16 NOTE — Telephone Encounter (Signed)
 Copied from CRM [id number]. Topic: Medical Record Request - Other >> Oct 15, 2023  4:45 PM Renie Ora wrote: Reason for CRM: Ricki Rodriguez with [employer] at [employer] called to request the speech and physical plan of care from [facility] stating it has not been signed, and it needs to be signed and sent back as soon as possible since it has been past 30 days. Toniann Fail can be contacted at Phone number [phone] and fax [phone]. Provided [employer] number of [place] office of [phone].

## 2023-10-17 ENCOUNTER — Other Ambulatory Visit (HOSPITAL_COMMUNITY): Payer: Self-pay

## 2023-10-17 NOTE — Telephone Encounter (Signed)
 Fax has been received. Provider will not be in office 10/19/23

## 2023-10-19 NOTE — Telephone Encounter (Signed)
 All forms signed and faxed with success result as of 10/19/23. Placed in the scan center folder. Nothing further needed.

## 2023-10-22 NOTE — Telephone Encounter (Signed)
 FYI  Please see below

## 2023-10-23 NOTE — Telephone Encounter (Signed)
 FYI  Please see below

## 2023-10-24 ENCOUNTER — Other Ambulatory Visit: Payer: Self-pay | Admitting: Urgent Care

## 2023-10-24 DIAGNOSIS — R410 Disorientation, unspecified: Secondary | ICD-10-CM

## 2023-10-24 DIAGNOSIS — R4189 Other symptoms and signs involving cognitive functions and awareness: Secondary | ICD-10-CM

## 2023-10-25 ENCOUNTER — Ambulatory Visit (INDEPENDENT_AMBULATORY_CARE_PROVIDER_SITE_OTHER): Admitting: Adult Health

## 2023-10-25 ENCOUNTER — Encounter (HOSPITAL_BASED_OUTPATIENT_CLINIC_OR_DEPARTMENT_OTHER): Payer: Self-pay | Admitting: Adult Health

## 2023-10-25 ENCOUNTER — Encounter (HOSPITAL_BASED_OUTPATIENT_CLINIC_OR_DEPARTMENT_OTHER): Payer: Self-pay

## 2023-10-25 VITALS — BP 124/76 | HR 72 | Ht 71.0 in | Wt 144.6 lb

## 2023-10-25 DIAGNOSIS — I5022 Chronic systolic (congestive) heart failure: Secondary | ICD-10-CM | POA: Diagnosis not present

## 2023-10-25 DIAGNOSIS — J479 Bronchiectasis, uncomplicated: Secondary | ICD-10-CM | POA: Diagnosis not present

## 2023-10-25 DIAGNOSIS — J449 Chronic obstructive pulmonary disease, unspecified: Secondary | ICD-10-CM

## 2023-10-25 DIAGNOSIS — J9611 Chronic respiratory failure with hypoxia: Secondary | ICD-10-CM | POA: Diagnosis not present

## 2023-10-25 NOTE — Assessment & Plan Note (Signed)
Appears euvolemic on exam.  Continue follow-up with cardiology 

## 2023-10-25 NOTE — Telephone Encounter (Signed)
 Called with update for Daughter Odilia Bennett.

## 2023-10-25 NOTE — Progress Notes (Signed)
 @Patient  ID: Matthew Hall, male    DOB: Jun 10, 1946, 78 y.o.   MRN: [id number]  Chief Complaint  Patient presents with   Follow-up   Discussed the use of AI scribe software for clinical note transcription with the patient, who gave verbal consent to proceed.  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  CT abdomen from 2019 shows right lower lobe bronchiectasis   CT chest 2009 showed >> RLL & lingular bronchectasis & areas of pleuro-parenchymal scarring. 8 mm low density lesion in liver >> stable on rpt CT 01/2008. Incidentally, not seen on CT in 1995.   FEV1 trend '95 59% >> 64% in 09/2007 >> 59% in 03/2008     09/2014  FEv1 40%, ratio 62 07/2015 FEV1 40%   PFTs 10/2022 severe airway obstruction, ratio 47, FEV1 33%, TLC normal, DLCO 50%   Anti-trypsin nml    CT chest June 17, 2023 showed bronchiectasis with mucoid impaction and extensive peribronchovascular nodularity.   Underwent bronchoscopy with BAL June 19, 2023.   Culture showed Stenotrophomonas maltophilia treated with a 2 week course of minocycline.  10/25/2023 Follow up : COPD , Bronchiectasis, O2 RF    History of Present Illness   Matthew Hall is a 78 year old male with COPD and bronchiectasis.  Chronic respiratory failure on oxygen. Patient returns for a follow-up visit.  Patient says since last visit he is doing better.  He continues on Breztri inhaler twice daily.  Denies any increased albuterol use.  He uses a flutter valve at least 3 times a day. Patient has suspected sleep apnea  sleep study is pending in June of this year.  He remains on oxygen 2 L.  He denies any increased cough or congestion.  No fever. Patient is supposed to use oxygen 2 L with activity.  Unfortunately patient brought his oxygen today but has not wearing it.  Walk test does show that he drops down into 87 to 88% on room air.  Encouraged him on oxygen compliance.  He was able to get a POC device.  He resides in independent living at [facility] and uses Ly[employer] for transportation to appointments. His wife recently returned from a rehabilitation facility after hospitalization.       Allergies  Allergen Reactions   Simvastatin Rash   Cortisone Nausea And Vomiting   Albuterol Palpitations and Other (See Comments)    Pt is okay to take xopenex, albuterol raises his heart rate   Augmentin [Amoxicillin-Pot Clavulanate] Rash   Ciprofloxacin Rash   Contrast Media [Iodinated Contrast Media] Rash   Flagyl [Metronidazole] Rash   Latex Hives and Rash   Sulfa Antibiotics Rash    Immunization History  Administered Date(s) Administered   Influenza Split 05/08/2017   Influenza Whole 06/05/1995, 03/31/2008   Influenza,inj,Quad PF,6+ Mos 04/20/2016   Influenza-Unspecified 04/09/2014, 04/19/2015, 04/09/2018, 04/10/2023   Moderna SARS-COV2 Booster Vaccination 05/08/2020   PNEUMOCOCCAL CONJUGATE-20 07/11/2022   Pneumococcal Conjugate-13 09/03/2013, 04/09/2014, 03/01/2016, 04/09/2017   Pneumococcal Polysaccharide-23 05/02/1994, 08/07/2000   Rabies, IM 12/07/2014, 12/10/2014, 12/14/2014, 12/21/2014   Rsv, Bivalent, Protein Subunit Rsvpref,pf Verdis Frederickson) 06/16/2022   Tdap 02/04/2017   Zoster Recombinant(Shingrix) 04/10/2015, 04/09/2017   Zoster,  Live 08/10/2013    Past Medical History:  Diagnosis Date   Anxiety    Aortic atherosclerosis (HCC)    Atrial fibrillation (HCC)    Basal cell carcinoma    Bladder cancer (HCC)    Bladder tumor    Chronic systolic heart failure (HCC)    Colon polyps    COPD  (chronic obstructive pulmonary disease) (HCC)    Cutaneous vasculitis    Diabetes (HCC)    Diabetic peripheral neuropathy (HCC) 12/27/2020   Diverticulosis    Essential hypertension    GERD (gastroesophageal reflux disease)    History of pneumonia    Nephrolithiasis    Osteoarthritis    Right foot drop    Stroke (HCC)     Tobacco History: Social History   Tobacco Use  Smoking Status Former   Current packs/day: 0.00   Average packs/day: 1 pack/day for 30.0 years (30.0 ttl pk-yrs)   Types: Cigarettes   Start date: 07/06/1981   Quit date: 07/07/2011   Years since quitting: 12.3  Smokeless Tobacco Never   Counseling given: Not Answered   Outpatient Medications Prior to Visit  Medication Sig Dispense Refill   Acetaminophen (TYLENOL) 325 MG CAPS Take 1-2 capsules by mouth every 6 (six) hours as needed (pain).     ALPRAZolam (XANAX) 0.25 MG tablet Take 0.25 mg by mouth 2 (two) times daily as needed for anxiety or sleep.   3   apixaban (ELIQUIS) 5 MG TABS tablet Take 1 tablet (5 mg total) by mouth 2 (two) times daily. 60 tablet 1   Budeson-Glycopyrrol-Formoterol (BREZTRI AEROSPHERE) 160-9-4.8 MCG/ACT AERO Inhale 2 puffs into the lungs 2 (two) times daily. 10.7 g 11   Cyanocobalamin (VITAMIN B12 PO) Take 1 tablet by mouth daily.     ELDERBERRY PO Take 1 tablet by mouth daily.     feeding supplement (ENSURE ENLIVE / ENSURE PLUS) LIQD Take 237 mLs by mouth 2 (two) times daily between meals.     guaiFENesin (MUCINEX) 600 MG 12 hr tablet Take 1 tablet (600 mg total) by mouth 2 (two) times daily. 60 tablet 5   hydrochlorothiazide (HYDRODIURIL) 12.5 MG tablet Take 1 tablet (12.5 mg total) by mouth as needed. 30 tablet 3   levalbuterol (XOPENEX HFA) 45 MCG/ACT inhaler Inhale 2 puffs into the lungs every 6 (six) hours as needed for wheezing. 15 g 3   levalbuterol (XOPENEX) 0.63 MG/3ML nebulizer solution Take 3 mLs (0.63 mg total) by nebulization every 6 (six) hours as needed for wheezing or  shortness of breath. 270 mL 5   metFORMIN (GLUCOPHAGE-XR) 500 MG 24 hr tablet Take 1 tablet (500 mg total) by mouth daily. 90 tablet 3   metoprolol succinate (TOPROL-XL) 25 MG 24 hr tablet Take 1 tablet (25 mg total) by mouth 2 (two) times daily. 180 tablet 0   omeprazole (PRILOSEC) 20 MG capsule Take 1 capsule (20 mg total) by mouth daily. 90 capsule 2   triamcinolone (NASACORT) 55 MCG/ACT AERO nasal inhaler Place 1 spray into the nose daily. 16.9 mL 5   nystatin (MYCOSTATIN) 100000 UNIT/ML suspension Take 5 mLs (500,000 Units total) by mouth 4 (four) times daily. (Patient not taking: Reported on 10/03/2023) 60 mL 0   No facility-administered medications prior to visit.     Review of Systems:   Constitutional:   No  weight loss, night sweats,  Fevers, chills,+ fatigue, or  lassitude.  HEENT:   No headaches,  Difficulty swallowing,  Tooth/dental problems, or  Sore throat,  No sneezing, itching, ear ache, nasal congestion, post nasal drip,   CV:  No chest pain,  Orthopnea, PND, swelling in lower extremities, anasarca, dizziness, palpitations, syncope.   GI  No heartburn, indigestion, abdominal pain, nausea, vomiting, diarrhea, change in bowel habits, loss of appetite, bloody stools.   Resp: No chest wall deformity  Skin: no rash or lesions.  GU: no dysuria, change in color of urine, no urgency or frequency.  No flank pain, no hematuria   MS:  No joint pain or swelling.  No decreased range of motion.  No back pain.    Physical Exam  BP 124/76   Pulse 72   Ht 5\' 11"  (1.803 m)   Wt 144 lb 9.6 oz (65.6 kg)   SpO2 93%   BMI 20.17 kg/m   GEN: A/Ox3; pleasant , NAD, elderly, chronically ill-appearing   HEENT:  [place]/AT,   NOSE-clear, THROAT-clear, no lesions, no postnasal drip or exudate noted.   NECK:  Supple w/ fair ROM; no JVD; normal carotid impulses w/o bruits; no thyromegaly or nodules palpated; no lymphadenopathy.    RESP  Clear  P & A; w/o, wheezes/ rales/ or  rhonchi. no accessory muscle use, no dullness to percussion  CARD:  RRR, no m/r/g, tr  peripheral edema, pulses intact, no cyanosis or clubbing.  GI:   Soft & nt; nml bowel sounds; no organomegaly or masses detected.   Musco: Warm bil, no deformities or joint swelling noted.   Neuro: alert, no focal deficits noted.    Skin: Warm, no lesions or rashes    Lab Results:       Imaging: No results found.  Administration History     None          Latest Ref Rng & Units 10/10/2022    2:57 PM 11/30/2017    1:26 PM  PFT Results  FVC-Pre L 2.20  1.78  P  FVC-Predicted Pre % 51  39  P  FVC-Post L 2.23  1.83  P  FVC-Predicted Post % 52  40  P  Pre FEV1/FVC % % 47  53  P  Post FEV1/FCV % % 49  55  P  FEV1-Pre L 1.05  0.94  P  FEV1-Predicted Pre % 33  28  P  FEV1-Post L 1.09  1.01  P  DLCO uncorrected ml/min/mmHg 12.64    DLCO UNC% % 50    DLVA Predicted % 98    TLC L 6.93    TLC % Predicted % 95    RV % Predicted % 178      P Preliminary result    No results found for: "NITRICOXIDE"      Assessment & Plan:   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) Appears stable continue on current regimen  Plan  Patient Instructions  Continue on Breztri 2 puffs Twice daily, rinse after use.  Xopenex inhaler or neb As needed   Aspiration precautions as discussed. Avoid eating late at night, be upright after eating for at least 2 hr .  Flutter valve Three times a day   Split night sleep study in June as planned  Continue on Oxygen 2l/m  Rheumatology records.  Follow up with Dr. Villa Greaser  in 3 months and As needed   Please contact office for sooner follow up if symptoms do not improve or worsen or seek emergency care      BRONCHIECTASIS W/O ACUTE EXACERBATION Continue with mucociliary clearance.  Continue with flutter valve.  Continue current  maintenance regimen  Plan  Patient Instructions  Continue on Breztri 2 puffs Twice daily, rinse after use.  Xopenex inhaler or neb As  needed   Aspiration precautions as discussed. Avoid eating late at night, be upright after eating for at least 2 hr .  Flutter valve Three times a day   Split night sleep study in June as planned  Continue on Oxygen 2l/m  Rheumatology records.  Follow up with Dr. Villa Greaser  in 3 months and As needed   Please contact office for sooner follow up if symptoms do not improve or worsen or seek emergency care      Chronic systolic CHF (congestive heart failure) (HCC) Appears euvolemic on exam.  Continue follow-up with cardiology  Chronic respiratory failure with hypoxia (HCC) Continue on oxygen to maintain O2 saturations greater than 88 to 90%.  Patient is encouraged on oxygen usage and compliance. Sleep study is pending     Roena Clark, NP 10/25/2023

## 2023-10-25 NOTE — Telephone Encounter (Signed)
 FYI

## 2023-10-25 NOTE — Patient Instructions (Addendum)
 Continue on Breztri 2 puffs Twice daily, rinse after use.  Xopenex inhaler or neb As needed   Aspiration precautions as discussed. Avoid eating late at night, be upright after eating for at least 2 hr .  Flutter valve Three times a day   Split night sleep study in June as planned  Continue on Oxygen 2l/m  Rheumatology records.  Follow up with Dr. Villa Greaser  in 3 months and As needed   Please contact office for sooner follow up if symptoms do not improve or worsen or seek emergency care

## 2023-10-25 NOTE — Assessment & Plan Note (Signed)
 Appears stable continue on current regimen  Plan  Patient Instructions  Continue on Breztri 2 puffs Twice daily, rinse after use.  Xopenex inhaler or neb As needed   Aspiration precautions as discussed. Avoid eating late at night, be upright after eating for at least 2 hr .  Flutter valve Three times a day   Split night sleep study in June as planned  Continue on Oxygen 2l/m  Rheumatology records.  Follow up with Dr. Villa Greaser  in 3 months and As needed   Please contact office for sooner follow up if symptoms do not improve or worsen or seek emergency care

## 2023-10-25 NOTE — Assessment & Plan Note (Signed)
 Continue on oxygen to maintain O2 saturations greater than 88 to 90%.  Patient is encouraged on oxygen usage and compliance. Sleep study is pending

## 2023-10-25 NOTE — Assessment & Plan Note (Signed)
 Continue with mucociliary clearance.  Continue with flutter valve.  Continue current maintenance regimen  Plan  Patient Instructions  Continue on Breztri 2 puffs Twice daily, rinse after use.  Xopenex inhaler or neb As needed   Aspiration precautions as discussed. Avoid eating late at night, be upright after eating for at least 2 hr .  Flutter valve Three times a day   Split night sleep study in June as planned  Continue on Oxygen 2l/m  Rheumatology records.  Follow up with Dr. Villa Greaser  in 3 months and As needed   Please contact office for sooner follow up if symptoms do not improve or worsen or seek emergency care

## 2023-10-26 ENCOUNTER — Other Ambulatory Visit: Payer: Self-pay

## 2023-10-28 ENCOUNTER — Other Ambulatory Visit: Payer: Self-pay

## 2023-10-28 ENCOUNTER — Emergency Department (HOSPITAL_COMMUNITY)

## 2023-10-28 ENCOUNTER — Observation Stay (HOSPITAL_COMMUNITY)
Admission: EM | Admit: 2023-10-28 | Discharge: 2023-10-31 | Disposition: A | Discharge status: Home / Self Care | Attending: Internal Medicine | Admitting: Internal Medicine

## 2023-10-28 ENCOUNTER — Encounter (HOSPITAL_COMMUNITY): Payer: Self-pay | Admitting: Family Medicine

## 2023-10-28 DIAGNOSIS — R911 Solitary pulmonary nodule: Secondary | ICD-10-CM | POA: Diagnosis present

## 2023-10-28 DIAGNOSIS — J9611 Chronic respiratory failure with hypoxia: Secondary | ICD-10-CM | POA: Diagnosis not present

## 2023-10-28 DIAGNOSIS — Z85828 Personal history of other malignant neoplasm of skin: Secondary | ICD-10-CM | POA: Diagnosis not present

## 2023-10-28 DIAGNOSIS — I2510 Atherosclerotic heart disease of native coronary artery without angina pectoris: Secondary | ICD-10-CM | POA: Insufficient documentation

## 2023-10-28 DIAGNOSIS — I5022 Chronic systolic (congestive) heart failure: Secondary | ICD-10-CM | POA: Diagnosis present

## 2023-10-28 DIAGNOSIS — E1142 Type 2 diabetes mellitus with diabetic polyneuropathy: Secondary | ICD-10-CM

## 2023-10-28 DIAGNOSIS — Z91040 Latex allergy status: Secondary | ICD-10-CM | POA: Diagnosis not present

## 2023-10-28 DIAGNOSIS — J479 Bronchiectasis, uncomplicated: Secondary | ICD-10-CM

## 2023-10-28 DIAGNOSIS — R4701 Aphasia: Principal | ICD-10-CM | POA: Diagnosis present

## 2023-10-28 DIAGNOSIS — E119 Type 2 diabetes mellitus without complications: Secondary | ICD-10-CM

## 2023-10-28 DIAGNOSIS — Z8551 Personal history of malignant neoplasm of bladder: Secondary | ICD-10-CM | POA: Diagnosis not present

## 2023-10-28 DIAGNOSIS — Z7984 Long term (current) use of oral hypoglycemic drugs: Secondary | ICD-10-CM | POA: Insufficient documentation

## 2023-10-28 DIAGNOSIS — Z87891 Personal history of nicotine dependence: Secondary | ICD-10-CM | POA: Insufficient documentation

## 2023-10-28 DIAGNOSIS — I110 Hypertensive heart disease with heart failure: Secondary | ICD-10-CM | POA: Diagnosis not present

## 2023-10-28 DIAGNOSIS — J449 Chronic obstructive pulmonary disease, unspecified: Secondary | ICD-10-CM | POA: Diagnosis present

## 2023-10-28 DIAGNOSIS — Z8673 Personal history of transient ischemic attack (TIA), and cerebral infarction without residual deficits: Secondary | ICD-10-CM | POA: Diagnosis not present

## 2023-10-28 DIAGNOSIS — Z7901 Long term (current) use of anticoagulants: Secondary | ICD-10-CM | POA: Diagnosis not present

## 2023-10-28 DIAGNOSIS — Z122 Encounter for screening for malignant neoplasm of respiratory organs: Secondary | ICD-10-CM | POA: Diagnosis not present

## 2023-10-28 DIAGNOSIS — F411 Generalized anxiety disorder: Secondary | ICD-10-CM | POA: Diagnosis present

## 2023-10-28 DIAGNOSIS — I480 Paroxysmal atrial fibrillation: Secondary | ICD-10-CM | POA: Diagnosis not present

## 2023-10-28 DIAGNOSIS — R2990 Unspecified symptoms and signs involving the nervous system: Secondary | ICD-10-CM

## 2023-10-28 DIAGNOSIS — Z79899 Other long term (current) drug therapy: Secondary | ICD-10-CM | POA: Diagnosis not present

## 2023-10-28 LAB — CBC
HCT: 40.4 % (ref 39.0–52.0)
Hemoglobin: 12.2 g/dL — ABNORMAL LOW (ref 13.0–17.0)
MCH: 28.8 pg (ref 26.0–34.0)
MCHC: 30.2 g/dL (ref 30.0–36.0)
MCV: 95.3 fL (ref 80.0–100.0)
Platelets: 289 10*3/uL (ref 150–400)
RBC: 4.24 MIL/uL (ref 4.22–5.81)
RDW: 14.3 % (ref 11.5–15.5)
WBC: 10.3 10*3/uL (ref 4.0–10.5)
nRBC: 0 % (ref 0.0–0.2)

## 2023-10-28 LAB — DIFFERENTIAL
Abs Immature Granulocytes: 0.06 10*3/uL (ref 0.00–0.07)
Basophils Absolute: 0.1 10*3/uL (ref 0.0–0.1)
Basophils Relative: 1 %
Eosinophils Absolute: 0.2 10*3/uL (ref 0.0–0.5)
Eosinophils Relative: 2 %
Immature Granulocytes: 1 %
Lymphocytes Relative: 17 %
Lymphs Abs: 1.7 10*3/uL (ref 0.7–4.0)
Monocytes Absolute: 1.2 10*3/uL — ABNORMAL HIGH (ref 0.1–1.0)
Monocytes Relative: 12 %
Neutro Abs: 7.1 10*3/uL (ref 1.7–7.7)
Neutrophils Relative %: 67 %

## 2023-10-28 LAB — URINALYSIS, ROUTINE W REFLEX MICROSCOPIC
Bilirubin Urine: NEGATIVE
Glucose, UA: NEGATIVE mg/dL
Hgb urine dipstick: NEGATIVE
Ketones, ur: NEGATIVE mg/dL
Leukocytes,Ua: NEGATIVE
Nitrite: NEGATIVE
Protein, ur: NEGATIVE mg/dL
Specific Gravity, Urine: >1.046 — ABNORMAL HIGH (ref 1.005–1.030)
pH: 6 (ref 5.0–8.0)

## 2023-10-28 LAB — I-STAT CHEM 8, ED
BUN: 12 mg/dL (ref 8–23)
Calcium, Ion: 1.09 mmol/L — ABNORMAL LOW (ref 1.15–1.40)
Chloride: 102 mmol/L (ref 98–111)
Creatinine, Ser: 0.9 mg/dL (ref 0.61–1.24)
Glucose, Bld: 107 mg/dL — ABNORMAL HIGH (ref 70–99)
HCT: 39 % (ref 39.0–52.0)
Hemoglobin: 13.3 g/dL (ref 13.0–17.0)
Potassium: 3.6 mmol/L (ref 3.5–5.1)
Sodium: 141 mmol/L (ref 135–145)
TCO2: 28 mmol/L (ref 22–32)

## 2023-10-28 LAB — COMPREHENSIVE METABOLIC PANEL WITH GFR
ALT: 12 U/L (ref 0–44)
AST: 31 U/L (ref 15–41)
Albumin: 3.1 g/dL — ABNORMAL LOW (ref 3.5–5.0)
Alkaline Phosphatase: 43 U/L (ref 38–126)
Anion gap: 11 (ref 5–15)
BUN: 10 mg/dL (ref 8–23)
CO2: 26 mmol/L (ref 22–32)
Calcium: 8.7 mg/dL — ABNORMAL LOW (ref 8.9–10.3)
Chloride: 101 mmol/L (ref 98–111)
Creatinine, Ser: 0.81 mg/dL (ref 0.61–1.24)
GFR, Estimated: >60 mL/min (ref >60)
Glucose, Bld: 109 mg/dL — ABNORMAL HIGH (ref 70–99)
Potassium: 4.1 mmol/L (ref 3.5–5.1)
Sodium: 138 mmol/L (ref 135–145)
Total Bilirubin: 1.1 mg/dL (ref 0.0–1.2)
Total Protein: 6.7 g/dL (ref 6.5–8.1)

## 2023-10-28 LAB — RAPID URINE DRUG SCREEN, HOSP PERFORMED
Amphetamines: NOT DETECTED
Barbiturates: NOT DETECTED
Benzodiazepines: POSITIVE — AB
Cocaine: NOT DETECTED
Opiates: NOT DETECTED
Tetrahydrocannabinol: NOT DETECTED

## 2023-10-28 LAB — ETHANOL: Alcohol, Ethyl (B): <10 mg/dL (ref <10)

## 2023-10-28 LAB — APTT: aPTT: 34 s (ref 24–36)

## 2023-10-28 LAB — PROTIME-INR
INR: 1.4 — ABNORMAL HIGH (ref 0.8–1.2)
Prothrombin Time: 17.3 s — ABNORMAL HIGH (ref 11.4–15.2)

## 2023-10-28 MED ORDER — BUDESON-GLYCOPYRROL-FORMOTEROL 160-9-4.8 MCG/ACT IN AERO: 2.0000 | INHALATION_SPRAY | Freq: Two times a day (BID) | RESPIRATORY_TRACT | Status: DC

## 2023-10-28 MED ORDER — GUAIFENESIN ER 600 MG PO TB12
600.0000 mg | ORAL_TABLET | Freq: Two times a day (BID) | ORAL | Status: DC
  Administered 2023-10-28 – 2023-10-31 (×6): 600 mg via ORAL
  Filled 2023-10-28 (×6): qty 1

## 2023-10-28 MED ORDER — APIXABAN 5 MG PO TABS
5.0000 mg | ORAL_TABLET | Freq: Two times a day (BID) | ORAL | Status: DC
  Administered 2023-10-28 – 2023-10-31 (×6): 5 mg via ORAL
  Filled 2023-10-28 (×6): qty 1

## 2023-10-28 MED ORDER — METOPROLOL SUCCINATE ER 25 MG PO TB24
25.0000 mg | ORAL_TABLET | Freq: Two times a day (BID) | ORAL | Status: DC
  Administered 2023-10-28 – 2023-10-31 (×6): 25 mg via ORAL
  Filled 2023-10-28 (×6): qty 1

## 2023-10-28 MED ORDER — BUDESON-GLYCOPYRROL-FORMOTEROL 160-9-4.8 MCG/ACT IN AERO
2.0000 | INHALATION_SPRAY | Freq: Two times a day (BID) | RESPIRATORY_TRACT | Status: DC
  Administered 2023-10-29 – 2023-10-31 (×5): 2 via RESPIRATORY_TRACT
  Filled 2023-10-28: qty 5.9

## 2023-10-28 MED ORDER — DIAZEPAM 5 MG/ML IJ SOLN
2.5000 mg | Freq: Once | INTRAMUSCULAR | Status: AC
  Administered 2023-10-28: 2.5 mg via INTRAVENOUS
  Filled 2023-10-28: qty 2

## 2023-10-28 MED ORDER — ACETAMINOPHEN 650 MG RE SUPP: 650.0000 mg | Freq: Four times a day (QID) | RECTAL | Status: DC | PRN

## 2023-10-28 MED ORDER — PROCHLORPERAZINE EDISYLATE 10 MG/2ML IJ SOLN: 5.0000 mg | Freq: Four times a day (QID) | INTRAMUSCULAR | Status: DC | PRN

## 2023-10-28 MED ORDER — HYDROCHLOROTHIAZIDE 12.5 MG PO TABS
12.5000 mg | ORAL_TABLET | Freq: Every day | ORAL | Status: DC
  Administered 2023-10-28 – 2023-10-31 (×4): 12.5 mg via ORAL
  Filled 2023-10-28 (×4): qty 1

## 2023-10-28 MED ORDER — SODIUM CHLORIDE 0.9% FLUSH
3.0000 mL | Freq: Two times a day (BID) | INTRAVENOUS | Status: DC
  Administered 2023-10-28 – 2023-10-31 (×6): 3 mL via INTRAVENOUS

## 2023-10-28 MED ORDER — IOHEXOL 350 MG/ML SOLN
75.0000 mL | Freq: Once | INTRAVENOUS | Status: AC | PRN
Start: 2023-10-28 — End: 2023-10-28
  Administered 2023-10-28: 75 mL via INTRAVENOUS

## 2023-10-28 MED ORDER — SENNOSIDES-DOCUSATE SODIUM 8.6-50 MG PO TABS: 1.0000 | ORAL_TABLET | Freq: Every evening | ORAL | Status: DC | PRN

## 2023-10-28 MED ORDER — ALPRAZOLAM 0.25 MG PO TABS
0.2500 mg | ORAL_TABLET | Freq: Two times a day (BID) | ORAL | Status: DC | PRN
  Administered 2023-10-29 – 2023-10-31 (×4): 0.25 mg via ORAL
  Filled 2023-10-28 (×4): qty 1

## 2023-10-28 MED ORDER — LEVALBUTEROL HCL 0.63 MG/3ML IN NEBU: 0.6300 mg | INHALATION_SOLUTION | Freq: Four times a day (QID) | RESPIRATORY_TRACT | Status: DC | PRN

## 2023-10-28 MED ORDER — PANTOPRAZOLE SODIUM 40 MG PO TBEC
40.0000 mg | DELAYED_RELEASE_TABLET | Freq: Every day | ORAL | Status: DC
  Administered 2023-10-29 – 2023-10-31 (×3): 40 mg via ORAL
  Filled 2023-10-28 (×3): qty 1

## 2023-10-28 MED ORDER — ACETAMINOPHEN 325 MG PO TABS: 650.0000 mg | ORAL_TABLET | Freq: Four times a day (QID) | ORAL | Status: DC | PRN

## 2023-10-28 NOTE — ED Notes (Signed)
Daughter updated via phone.

## 2023-10-28 NOTE — ED Provider Notes (Signed)
 [facility] Provider Note   CSN: [id number] Arrival date & time: 10/28/23  1504     History  Chief Complaint  Patient presents with   Code Stroke    Matthew Hall is a 78 y.o. male.  Patient arrives from [facility] assisted living facility.  Strokelike symptoms started around 1330.  Was aphasic.  Per EMS sometimes follow commands.  Has prior stroke with right-sided deficits.  History of A-fib on Eliquis .  Took last dose this morning.  No recent falls.  He is on 2 L of oxygen  chronically.  He denies any weakness numbness tingling.  History of COPD anxiety  The history is provided by the patient.       Home Medications Prior to Admission medications   Medication Sig Start Date End Date Taking? Authorizing Provider  Acetaminophen  (TYLENOL ) 325 MG CAPS Take 1-2 capsules by mouth every 6 (six) hours as needed (pain).    [provider]  ALPRAZolam  (XANAX ) 0.25 MG tablet Take 0.25 mg by mouth 2 (two) times daily as needed for anxiety or sleep.  07/01/14   [provider]  apixaban  (ELIQUIS ) 5 MG TABS tablet Take 1 tablet (5 mg total) by mouth 2 (two) times daily. 09/10/23   [name], [name], PA  Budeson-Glycopyrrol-Formoterol  (BREZTRI  AEROSPHERE) 160-9-4.8 MCG/ACT AERO Inhale 2 puffs into the lungs 2 (two) times daily. 09/10/23   [name], [name], PA  Cyanocobalamin  (VITAMIN B12 PO) Take 1 tablet by mouth daily.    [provider]  ELDERBERRY PO Take 1 tablet by mouth daily.    [provider]  feeding supplement (ENSURE ENLIVE / ENSURE PLUS) LIQD Take 237 mLs by mouth 2 (two) times daily between meals. 07/15/23   [name], [name], MD  guaiFENesin  (MUCINEX ) 600 MG 12 hr tablet Take 1 tablet (600 mg total) by mouth 2 (two) times daily. 10/03/23   [name], [name], PA  hydrochlorothiazide  (HYDRODIURIL ) 12.5 MG tablet Take 1 tablet (12.5 mg total) by mouth as needed. 10/03/23   [name], Laurina Popper L, PA   levalbuterol  (XOPENEX  HFA) 45 MCG/ACT inhaler Inhale 2 puffs into the lungs every 6 (six) hours as needed for wheezing. 10/01/23 09/30/24  [name], Macdonald Savoy, NP  levalbuterol  (XOPENEX ) 0.63 MG/3ML nebulizer solution Take 3 mLs (0.63 mg total) by nebulization every 6 (six) hours as needed for wheezing or shortness of breath. 07/17/23   [name], Macdonald Savoy, NP  metFORMIN  (GLUCOPHAGE -XR) 500 MG 24 hr tablet Take 1 tablet (500 mg total) by mouth daily. 05/31/23     metoprolol  succinate (TOPROL -XL) 25 MG 24 hr tablet Take 1 tablet (25 mg total) by mouth 2 (two) times daily. 10/15/23   Gerard Knight, MD  nystatin  (MYCOSTATIN ) 100000 UNIT/ML suspension Take 5 mLs (500,000 Units total) by mouth 4 (four) times daily. Patient not taking: Reported on 10/03/2023 08/17/23   Haydee Lipa, MD  omeprazole  (PRILOSEC) 20 MG capsule Take 1 capsule (20 mg total) by mouth daily. 09/10/23   [name], [name], PA  triamcinolone  (NASACORT ) 55 MCG/ACT AERO nasal inhaler Place 1 spray into the nose daily. 10/03/23   [name], Laurina Popper L, PA      Allergies    Simvastatin, Cortisone, Albuterol , Augmentin [amoxicillin-pot clavulanate], Ciprofloxacin, Contrast media [iodinated contrast media], Flagyl  [metronidazole ], Latex, and Sulfa  antibiotics    Review of Systems   Review of Systems  Physical Exam Updated Vital Signs BP (!) 182/117   Pulse (!) 54   Temp 98.1  F (36.7 C) (Oral)   Resp (!) 27   Ht 5\' 11"  (1.803 m)   Wt 65.6 kg   SpO2 97%   BMI 20.17 kg/m  Physical Exam Vitals and nursing note reviewed.  Constitutional:      General: He is not in acute distress.    Appearance: He is well-developed. He is not ill-appearing.  HENT:     Head: Normocephalic and atraumatic.     Nose: Nose normal.     Mouth/Throat:     Mouth: Mucous membranes are moist.  Eyes:     Extraocular Movements: Extraocular movements intact.     Conjunctiva/sclera: Conjunctivae normal.     Pupils: Pupils are equal, round, and reactive to  light.  Cardiovascular:     Rate and Rhythm: Normal rate and regular rhythm.     Pulses: Normal pulses.     Heart sounds: Normal heart sounds. No murmur heard. Pulmonary:     Effort: Pulmonary effort is normal. No respiratory distress.     Breath sounds: Normal breath sounds.  Abdominal:     Palpations: Abdomen is soft.     Tenderness: There is no abdominal tenderness.  Musculoskeletal:        General: No swelling.     Cervical back: Normal range of motion and neck supple.  Skin:    General: Skin is warm and dry.     Capillary Refill: Capillary refill takes less than 2 seconds.  Neurological:     Mental Status: He is alert.     Comments: No major strength changes between both upper extremities, normal sensation, does appear that he is aphasic can follow commands no obvious visual fields issues  Psychiatric:        Mood and Affect: Mood normal.     ED Results / Procedures / Treatments   Labs (all labs ordered are listed, but only abnormal results are displayed) Labs Reviewed  PROTIME-INR - Abnormal; Notable for the following components:      Result Value   Prothrombin Time 17.3 (*)    INR 1.4 (*)    All other components within normal limits  CBC - Abnormal; Notable for the following components:   Hemoglobin 12.2 (*)    All other components within normal limits  DIFFERENTIAL - Abnormal; Notable for the following components:   Monocytes Absolute 1.2 (*)    All other components within normal limits  RAPID URINE DRUG SCREEN, HOSP PERFORMED - Abnormal; Notable for the following components:   Benzodiazepines POSITIVE (*)    All other components within normal limits  URINALYSIS, ROUTINE W REFLEX MICROSCOPIC - Abnormal; Notable for the following components:   Specific Gravity, Urine >1.046 (*)    All other components within normal limits  COMPREHENSIVE METABOLIC PANEL WITH GFR - Abnormal; Notable for the following components:   Glucose, Bld 109 (*)    Calcium  8.7 (*)     Albumin 3.1 (*)    All other components within normal limits  I-STAT CHEM 8, ED - Abnormal; Notable for the following components:   Glucose, Bld 107 (*)    Calcium , Ion 1.09 (*)    All other components within normal limits  ETHANOL  APTT    EKG EKG Interpretation Date/Time:  Sunday October 28 2023 15:35:16 EDT Ventricular Rate:  70 PR Interval:  197 QRS Duration:  133 QT Interval:  440 QTC Calculation: 475 R Axis:   257  Text Interpretation: Sinus rhythm Atrial premature complex Probable left atrial enlargement  Nonspecific IVCD with LAD Anterolateral infarct, old Confirmed by Lowery Rue [phone]) on 10/28/2023 4:30:17 PM  Radiology CT ANGIO HEAD NECK W WO CM (CODE STROKE) Result Date: 10/28/2023 CLINICAL DATA:  Neuro deficit, acute, stroke suspected.  Aphasia EXAM: CT ANGIOGRAPHY HEAD AND NECK WITH AND WITHOUT CONTRAST TECHNIQUE: Multidetector CT imaging of the head and neck was performed using the standard protocol during bolus administration of intravenous contrast. Multiplanar CT image reconstructions and MIPs were obtained to evaluate the vascular anatomy. Carotid stenosis measurements (when applicable) are obtained utilizing NASCET criteria, using the distal internal carotid diameter as the denominator. RADIATION DOSE REDUCTION: This exam was performed according to the departmental dose-optimization program which includes automated exposure control, adjustment of the mA and/or kV according to patient size and/or use of iterative reconstruction technique. CONTRAST:  75mL OMNIPAQUE  IOHEXOL  350 MG/ML SOLN COMPARISON:  MR angiogram circle of Willis 04/12/2023. FINDINGS: CTA NECK FINDINGS Aortic arch: Atherosclerotic calcifications are present in the aorta and great vessel origins without focal stenosis. No aneurysm or dissection is present. Right carotid system: The right common carotid artery is within normal limits. Atherosclerotic calcifications are present at the right ICA bifurcation  without significant stenosis relative to the more distal vessel. The cervical right ICA is otherwise normal. Left carotid system: The left common carotid artery is within normal limits. Bifurcation is unremarkable. The cervical left ICA is normal. Vertebral arteries: Left vertebral artery is the dominant vessel. Both vertebral arteries originate from the subclavian arteries without significant stenosis. No significant stenosis is present in either vertebral artery in the neck. Skeleton: Endplate degenerative changes are most evident at C5-6 and C6-7. Slight degenerative anterolisthesis is present at C5-6. Alignment is otherwise anatomic. No focal osseous lesions are present. Other neck: The soft tissues of the neck are unremarkable. Salivary glands are within normal limits. Thyroid  is normal. No significant adenopathy is present. No focal mucosal or submucosal lesions are present. Upper chest: 2 separate 4 mm nodules in the right upper lobe are noted on images 22 and 27 of series 5. Scarring is present at the right lung apex. A 7 mm nodule is present in the left upper lobe on image 24 of series 5. Review of the MIP images confirms the above findings CTA HEAD FINDINGS Anterior circulation: Atherosclerotic calcifications are present within the cavernous internal carotid arteries bilaterally. No significant stenosis is present through the ICA terminus. The A1 and M1 segments are normal. The anterior communicating artery is patent. The MCA bifurcations are normal bilaterally. The ACA and MCA branch vessels are normal bilaterally. Posterior circulation: The left vertebral artery is dominant. PICA origin is visualized and normal. Vertebrobasilar junction and basilar artery are normal. The superior cerebellar arteries are patent. Both posterior cerebral arteries originate from basilar tip. The PCA branch vessels are normal bilaterally. Venous sinuses: The dural sinuses are patent. The straight sinus and deep cerebral veins  are intact. Cortical veins are within normal limits. No significant vascular malformation is evident. Anatomic variants: None Review of the MIP images confirms the above findings IMPRESSION: 1. No emergent large vessel occlusion. 2. Atherosclerotic calcifications at the right ICA bifurcation and cavernous internal carotid arteries bilaterally without significant stenosis relative to the more distal vessels. 3. Normal variant CTA Circle of Willis without significant proximal stenosis, aneurysm, or branch vessel occlusion. 4. 7 mm nodule in the left upper lobe. Solid pulmonary nodule measuring 7 mm. Per Fleischner Society Guidelines, recommend a non-contrast Chest CT at 6-12 months. If patient is high risk  for malignancy, consider an additional non-contrast Chest CT at 18-24 months. If patient is low risk for malignancy, non-contrast Chest CT at 18-24 months is optional. These guidelines do not apply to immunocompromised patients and patients with cancer. Follow up in patients with significant comorbidities as clinically warranted. For lung cancer screening, adhere to Lung-RADS guidelines. Reference: Radiology. 2017; 284(1):228-43. Electronically Signed   By: Audree Leas M.D.   On: 10/28/2023 15:38   CT HEAD CODE STROKE WO CONTRAST Result Date: 10/28/2023 CLINICAL DATA:  Code stroke. Neuro deficit, acute, stroke suspected. Aphasia. EXAM: CT HEAD WITHOUT CONTRAST TECHNIQUE: Contiguous axial images were obtained from the base of the skull through the vertex without intravenous contrast. RADIATION DOSE REDUCTION: This exam was performed according to the departmental dose-optimization program which includes automated exposure control, adjustment of the mA and/or kV according to patient size and/or use of iterative reconstruction technique. COMPARISON:  CT head without contrast 04/12/2023. FINDINGS: Brain: A remote lacunar infarct is present in the right caudate head and anterior limb of the right internal  capsule. Remote ischemic changes are again noted in the thalami bilaterally. No acute infarct, hemorrhage, or mass lesion is present. Atrophy and white matter changes are moderately advanced for age. The ventricles are proportionate to the degree of atrophy. No significant extraaxial fluid collection is present. A left paramedian lacunar infarct of the pons is new since the prior exam. Remote lacunar infarcts in the cerebellum bilaterally are stable. Vascular: Atherosclerotic calcifications are present within the cavernous internal carotid arteries bilaterally. No hyperdense vessel is present. Skull: Insert normal skull No significant extracranial soft tissue lesion is present. Sinuses/Orbits: The paranasal sinuses and mastoid air cells are clear. Bilateral lens replacements are noted. Globes and orbits are otherwise unremarkable. ASPECTS [facility] Stroke Program Early CT Score) - Ganglionic level infarction (caudate, lentiform nuclei, internal capsule, insula, M1-M3 cortex): 7/7 - Supraganglionic infarction (M4-M6 cortex): 3/3 Total score (0-10 with 10 being normal): 10/10 IMPRESSION: 1. Left paramedian pontine lacunar infarct is new since the prior study. It is age indeterminate. MRI could be used for further evaluation if clinically indicated. 2. Aspects is 10/10. 3. Remote lacunar infarcts of the right caudate head and anterior limb of the right internal capsule. 4. Remote ischemic changes in the thalami bilaterally. 5. Remote lacunar infarcts in the cerebellum bilaterally. 6. Atrophy and white matter disease is moderately advanced for age. This likely reflects the sequela of chronic microvascular ischemia. The above was relayed via text pager to Dr. Cleone Dad on 10/28/2023 at 15:22 . Electronically Signed   By: Audree Leas M.D.   On: 10/28/2023 15:23    Procedures Procedures    Medications Ordered in ED Medications  iohexol  (OMNIPAQUE ) 350 MG/ML injection 75 mL (75 mLs Intravenous Contrast Given  10/28/23 1523)  diazepam  (VALIUM ) injection 2.5 mg (2.5 mg Intravenous Given 10/28/23 1731)    ED Course/ Medical Decision Making/ A&P                                 Medical Decision Making Amount and/or Complexity of Data Reviewed Labs: ordered. Radiology: ordered.  Risk Prescription drug management. Decision regarding hospitalization.   Matthew Hall is here as a code stroke.  Aphasia.  Prior strokes right-sided deficits.  History of A-fib on Eliquis .  He appears to have aphasia on exam.  Does not follow commands all the time.  No obvious major weakness numbness visual field changes.  Not a TNK candidate given that he is on Eliquis  and took it this morning.  No LVO on CT imaging.  Question if may be left paramedian pontine lunar infarct which is new since prior studies.  Will get MRI to further evaluate.  Multiple remote infarcts.  Does have incidental pulmonary nodule as well.  Will have him follow-up with primary care doctor regarding may be need for additional imaging.  He stable on 2 L of oxygen .  He denies any chest pain shortness of breath weakness numbness tingling otherwise.  Evaluated for metabolic cause for this symptoms today but there is no obvious UTI.  No significant leukocytosis anemia or electrolyte abnormality.  Lab works unremarkable otherwise.  Dr. Cleone Dad with neurology recommending MRI will reevaluate after that.  Overall lab works unremarkable.  No signs of urine infection or electrolyte abnormality your lab work abnormality.  Patient seems to have improved after IV Valium  for his MRI.  Neurology reached back out to me and they do want him to be admitted for stroke rule out, seizure rule out.  He will get EEG.  Will hold on any antiseizure meds at this time to help with diagnostics with EEG.  Neurology will follow.  Please refer to their note.  MRI has been completed but not read yet by radiology.  This chart was dictated using voice recognition software.  Despite best  efforts to proofread,  errors can occur which can change the documentation meaning.         Final Clinical Impression(s) / ED Diagnoses Final diagnoses:  Aphasia  Stroke-like symptoms    Rx / DC Orders ED Discharge Orders     None         Lowery Rue, DO 10/28/23 1854

## 2023-10-28 NOTE — ED Triage Notes (Signed)
 Pt bib [facility] from [facility] . EMS called due to stroke like symptoms. Patients daughter called patient @ 48 and noticed father was his normal. At 82 patients daughter called back and noticed he was aphasic. LKW 1332.    Per ems patient sometimes follows commands.  HX: prior stroke with right side deficits. Hx of afib.  Pt on eliquis . No recent falls.   20G RAC 18G LAC   EMS VS 186/101 98% RA HR 80 CBG 152  **Per daughter,patient is on 2L [place] at baseline.

## 2023-10-28 NOTE — H&P (Signed)
 History and Physical    Matthew Hall ZOX:[id number] DOB: 1946/02/18 DOA: 10/28/2023  PCP: Mandy Second, PA   Patient coming from: ILF   Chief Complaint: Difficulty speaking   HPI: Matthew Hall is a 78 y.o. 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  Patient was in his usual state when speaking with his daughter at 1:32 PM today.  She spoke with him again 30 minutes later and he was confused, repeated that he was unable to understand what she was saying, and was speaking unintelligibly.   Symptoms have been waxing and waning in the ED and reportedly improved significantly after the patient was given Valium  in preparation for MRI.  ED Course: Upon arrival to the ED, patient is found to be afebrile and saturating well on 2 L/min of supplemental oxygen  with elevated blood pressure.  EKG demonstrates sinus rhythm with PAC.  CTA head and neck is negative for large vessel occlusion but notable for left upper lobe lung nodule.  MRI brain is negative for acute abnormality but reveals remote lacunar infarctions and chronic microvascular ischemic disease.  Neurology was consulted by the ED physician and has recommended observation in the hospital for EEG.  Review of Systems:  ROS limited by patient's clinical condition.  Past Medical History:  Diagnosis Date   Anxiety    Aortic atherosclerosis (HCC)    Atrial fibrillation (HCC)    Basal cell carcinoma    Bladder cancer (HCC)    Bladder tumor    Chronic systolic heart failure (HCC)    Colon polyps    COPD (chronic obstructive pulmonary disease) (HCC)    Cutaneous vasculitis    Diabetes (HCC)    Diabetic peripheral neuropathy (HCC)  12/27/2020   Diverticulosis    Essential hypertension    GERD (gastroesophageal reflux disease)    History of pneumonia    Nephrolithiasis    Osteoarthritis    Right foot drop    Stroke [facility])     Past Surgical History:  Procedure Laterality Date   BRONCHIAL WASHINGS  06/19/2023   Procedure: BRONCHIAL WASHINGS;  Surgeon: Joesph Mussel, DO;  Location: [facility] ENDOSCOPY;  Service: Cardiopulmonary;;   COLONOSCOPY     Kidney stones removed  1996   SKIN CANCER EXCISION Left    2003   TRANSURETHRAL RESECTION OF BLADDER TUMOR  1996   VIDEO BRONCHOSCOPY N/A 06/19/2023   Procedure: VIDEO BRONCHOSCOPY WITHOUT FLUORO;  Surgeon: Joesph Mussel, DO;  Location: [facility] ENDOSCOPY;  Service: Cardiopulmonary;  Laterality: N/A;    Social History:   reports that he quit smoking about 12 years ago. His smoking use included cigarettes. He started smoking about 42 years ago. He has a 30 pack-year smoking history. He has never used smokeless tobacco. He reports that he does not drink alcohol  and does not use drugs.  Allergies  Allergen Reactions   Simvastatin Rash   Cortisone Nausea And Vomiting   Albuterol  Palpitations and Other (See Comments)    Pt is okay to take xopenex , albuterol  raises his heart rate   Augmentin [Amoxicillin-Pot Clavulanate] Rash   Ciprofloxacin Rash   Contrast Media [Iodinated Contrast Media] Rash   Flagyl  [Metronidazole ] Rash   Latex Hives and Rash   Sulfa  Antibiotics Rash    Family  History  Problem Relation Age of Onset   Diabetes Mother    CAD Mother    Emphysema Father    Diabetes Sister    Stroke Sister    Lung cancer Brother    Liver cancer Brother    Colon cancer Neg Hx      Prior to Admission medications   Medication Sig Start Date End Date Taking? Authorizing Provider  Acetaminophen  (TYLENOL ) 325 MG CAPS Take 1-2 capsules by mouth every 6 (six) hours as needed (pain).   Yes [provider]  ALPRAZolam  (XANAX ) 0.25 MG tablet Take 0.25 mg by mouth 2  (two) times daily as needed for anxiety or sleep.  07/01/14  Yes [provider]  apixaban  (ELIQUIS ) 5 MG TABS tablet Take 1 tablet (5 mg total) by mouth 2 (two) times daily. 09/10/23  Yes [name], [name], PA  Budeson-Glycopyrrol-Formoterol  (BREZTRI  AEROSPHERE) 160-9-4.8 MCG/ACT AERO Inhale 2 puffs into the lungs 2 (two) times daily. 09/10/23  Yes [name], [name], PA  Cyanocobalamin  (VITAMIN B12 PO) Take 1 tablet by mouth daily.   Yes [provider]  feeding supplement (ENSURE ENLIVE / ENSURE PLUS) LIQD Take 237 mLs by mouth 2 (two) times daily between meals. 07/15/23  Yes [name], [name], MD  guaiFENesin  (MUCINEX ) 600 MG 12 hr tablet Take 1 tablet (600 mg total) by mouth 2 (two) times daily. 10/03/23  Yes [name], [name], PA  hydrochlorothiazide  (HYDRODIURIL ) 12.5 MG tablet Take 1 tablet (12.5 mg total) by mouth as needed. 10/03/23  Yes [name], [name], PA  levalbuterol  (XOPENEX  HFA) 45 MCG/ACT inhaler Inhale 2 puffs into the lungs every 6 (six) hours as needed for wheezing. 10/01/23 09/30/24 Yes [name], Tammy S, NP  levalbuterol  (XOPENEX ) 0.63 MG/3ML nebulizer solution Take 3 mLs (0.63 mg total) by nebulization every 6 (six) hours as needed for wheezing or shortness of breath. 07/17/23  Yes [name], Tammy S, NP  metFORMIN  (GLUCOPHAGE -XR) 500 MG 24 hr tablet Take 1 tablet (500 mg total) by mouth daily. 05/31/23  Yes   metoprolol  succinate (TOPROL -XL) 25 MG 24 hr tablet Take 1 tablet (25 mg total) by mouth 2 (two) times daily. 10/15/23  Yes Gerard Knight, MD  omeprazole  (PRILOSEC) 20 MG capsule Take 1 capsule (20 mg total) by mouth daily. 09/10/23  Yes [name], [name], PA  triamcinolone  (NASACORT ) 55 MCG/ACT AERO nasal inhaler Place 1 spray into the nose daily. 10/03/23  Yes Mandy Second, [place]    Physical Exam: Vitals:   10/28/23 1700 10/28/23 1715 10/28/23 1817 10/28/23 1830  BP: (!) 178/75 (!) 163/84  (!) 182/117  Pulse: (!) 58 63  (!) 54  Resp: (!) 25 (!) 28  (!) 27  Temp:    98.1 F (36.7 C)   TempSrc:   Oral   SpO2: 99% 96%  97%  Weight:      Height:        Constitutional: NAD, no pallor or diaphoresis   Eyes: PERTLA, lids and conjunctivae normal ENMT: Mucous membranes are moist. Posterior pharynx clear of any exudate or lesions.   Neck: supple, no masses  Respiratory: Fine rales bilaterally, no wheezing. No accessory muscle use.  Cardiovascular: S1 & S2 heard, regular rate and rhythm. No extremity edema.  Abdomen: No distension, no tenderness, soft. Bowel sounds active.  Musculoskeletal: no clubbing / cyanosis. No joint deformity upper and lower extremities.   Skin: no significant rashes, lesions, ulcers. Warm, dry, well-perfused. Neurologic: Intermittent recent and expressive aphasia. CN 2-12 grossly  intact. Moving all extremities. Alert and oriented to person, place, and situation.  Psychiatric: Calm. Cooperative.    Labs and Imaging on Admission: I have personally reviewed following labs and imaging studies  CBC: Recent Labs  Lab 10/28/23 1507 10/28/23 1511  WBC 10.3  --   NEUTROABS 7.1  --   HGB 12.2* 13.3  HCT 40.4 39.0  MCV 95.3  --   PLT 289  --    Basic Metabolic Panel: Recent Labs  Lab 10/28/23 1511 10/28/23 1615  NA 141 138  K 3.6 4.1  CL 102 101  CO2  --  26  GLUCOSE 107* 109*  BUN 12 10  CREATININE 0.90 0.81  CALCIUM   --  8.7*   GFR: Estimated Creatinine Clearance: 68.6 mL/min (by C-G formula based on SCr of 0.81 mg/dL). Liver Function Tests: Recent Labs  Lab 10/28/23 1615  AST 31  ALT 12  ALKPHOS 43  BILITOT 1.1  PROT 6.7  ALBUMIN 3.1*   No results for input(s): "LIPASE", "AMYLASE" in the last 168 hours. No results for input(s): "AMMONIA" in the last 168 hours. Coagulation Profile: Recent Labs  Lab 10/28/23 1507  INR 1.4*   Cardiac Enzymes: No results for input(s): "CKTOTAL", "CKMB", "CKMBINDEX", "TROPONINI" in the last 168 hours. BNP (last 3 results) Recent Labs    09/10/23 1513  PROBNP 609*    HbA1C: No results for input(s): "HGBA1C" in the last 72 hours. CBG: No results for input(s): "GLUCAP" in the last 168 hours. Lipid Profile: No results for input(s): "CHOL", "HDL", "LDLCALC", "TRIG", "CHOLHDL", "LDLDIRECT" in the last 72 hours. Thyroid  Function Tests: No results for input(s): "TSH", "T4TOTAL", "FREET4", "T3FREE", "THYROIDAB" in the last 72 hours. Anemia Panel: No results for input(s): "VITAMINB12", "FOLATE", "FERRITIN", "TIBC", "IRON", "RETICCTPCT" in the last 72 hours. Urine analysis:    Component Value Date/Time   COLORURINE YELLOW 10/28/2023 1550   APPEARANCEUR CLEAR 10/28/2023 1550   LABSPEC >1.046 (H) 10/28/2023 1550   PHURINE 6.0 10/28/2023 1550   GLUCOSEU NEGATIVE 10/28/2023 1550   HGBUR NEGATIVE 10/28/2023 1550   BILIRUBINUR NEGATIVE 10/28/2023 1550   KETONESUR NEGATIVE 10/28/2023 1550   PROTEINUR NEGATIVE 10/28/2023 1550   NITRITE NEGATIVE 10/28/2023 1550   LEUKOCYTESUR NEGATIVE 10/28/2023 1550   Sepsis Labs: @LABRCNTIP (procalcitonin:4,lacticidven:4) )No results found for this or any previous visit (from the past 240 hours).   Radiological Exams on Admission: MR BRAIN WO CONTRAST Result Date: 10/28/2023 CLINICAL DATA:  Neuro deficit, acute, stroke suspected EXAM: MRI HEAD WITHOUT CONTRAST TECHNIQUE: Multiplanar, multiecho pulse sequences of the brain and surrounding structures were obtained without intravenous contrast. COMPARISON:  CT head from today. FINDINGS: Brain: No acute infarction, hemorrhage, hydrocephalus, extra-axial collection or mass lesion. Remote right cerebellar and pontine infarcts. Patchy T2/FLAIR hyperintensities the white matter, compatible with chronic microvascular ischemic disease. Cerebral atrophy. Vascular: Major arterial flow voids are maintained. Skull and upper cervical spine: Normal marrow signal. Sinuses/Orbits: Clear sinuses.  No acute orbital findings. IMPRESSION: 1. No evidence of acute intracranial abnormality. 2. Remote  lacunar infarcts and chronic microvascular ischemic disease. Electronically Signed   By: Stevenson Elbe M.D.   On: 10/28/2023 19:08   CT ANGIO HEAD NECK W WO CM (CODE STROKE) Result Date: 10/28/2023 CLINICAL DATA:  Neuro deficit, acute, stroke suspected.  Aphasia EXAM: CT ANGIOGRAPHY HEAD AND NECK WITH AND WITHOUT CONTRAST TECHNIQUE: Multidetector CT imaging of the head and neck was performed using the standard protocol during bolus administration of intravenous contrast. Multiplanar CT image reconstructions and MIPs were  obtained to evaluate the vascular anatomy. Carotid stenosis measurements (when applicable) are obtained utilizing NASCET criteria, using the distal internal carotid diameter as the denominator. RADIATION DOSE REDUCTION: This exam was performed according to the departmental dose-optimization program which includes automated exposure control, adjustment of the mA and/or kV according to patient size and/or use of iterative reconstruction technique. CONTRAST:  75mL OMNIPAQUE  IOHEXOL  350 MG/ML SOLN COMPARISON:  MR angiogram circle of Willis 04/12/2023. FINDINGS: CTA NECK FINDINGS Aortic arch: Atherosclerotic calcifications are present in the aorta and great vessel origins without focal stenosis. No aneurysm or dissection is present. Right carotid system: The right common carotid artery is within normal limits. Atherosclerotic calcifications are present at the right ICA bifurcation without significant stenosis relative to the more distal vessel. The cervical right ICA is otherwise normal. Left carotid system: The left common carotid artery is within normal limits. Bifurcation is unremarkable. The cervical left ICA is normal. Vertebral arteries: Left vertebral artery is the dominant vessel. Both vertebral arteries originate from the subclavian arteries without significant stenosis. No significant stenosis is present in either vertebral artery in the neck. Skeleton: Endplate degenerative changes are  most evident at C5-6 and C6-7. Slight degenerative anterolisthesis is present at C5-6. Alignment is otherwise anatomic. No focal osseous lesions are present. Other neck: The soft tissues of the neck are unremarkable. Salivary glands are within normal limits. Thyroid  is normal. No significant adenopathy is present. No focal mucosal or submucosal lesions are present. Upper chest: 2 separate 4 mm nodules in the right upper lobe are noted on images 22 and 27 of series 5. Scarring is present at the right lung apex. A 7 mm nodule is present in the left upper lobe on image 24 of series 5. Review of the MIP images confirms the above findings CTA HEAD FINDINGS Anterior circulation: Atherosclerotic calcifications are present within the cavernous internal carotid arteries bilaterally. No significant stenosis is present through the ICA terminus. The A1 and M1 segments are normal. The anterior communicating artery is patent. The MCA bifurcations are normal bilaterally. The ACA and MCA branch vessels are normal bilaterally. Posterior circulation: The left vertebral artery is dominant. PICA origin is visualized and normal. Vertebrobasilar junction and basilar artery are normal. The superior cerebellar arteries are patent. Both posterior cerebral arteries originate from basilar tip. The PCA branch vessels are normal bilaterally. Venous sinuses: The dural sinuses are patent. The straight sinus and deep cerebral veins are intact. Cortical veins are within normal limits. No significant vascular malformation is evident. Anatomic variants: None Review of the MIP images confirms the above findings IMPRESSION: 1. No emergent large vessel occlusion. 2. Atherosclerotic calcifications at the right ICA bifurcation and cavernous internal carotid arteries bilaterally without significant stenosis relative to the more distal vessels. 3. Normal variant CTA Circle of Willis without significant proximal stenosis, aneurysm, or branch vessel  occlusion. 4. 7 mm nodule in the left upper lobe. Solid pulmonary nodule measuring 7 mm. Per Fleischner Society Guidelines, recommend a non-contrast Chest CT at 6-12 months. If patient is high risk for malignancy, consider an additional non-contrast Chest CT at 18-24 months. If patient is low risk for malignancy, non-contrast Chest CT at 18-24 months is optional. These guidelines do not apply to immunocompromised patients and patients with cancer. Follow up in patients with significant comorbidities as clinically warranted. For lung cancer screening, adhere to Lung-RADS guidelines. Reference: Radiology. 2017; 284(1):228-43. Electronically Signed   By: Audree Leas M.D.   On: 10/28/2023 15:38   CT HEAD  CODE STROKE WO CONTRAST Result Date: 10/28/2023 CLINICAL DATA:  Code stroke. Neuro deficit, acute, stroke suspected. Aphasia. EXAM: CT HEAD WITHOUT CONTRAST TECHNIQUE: Contiguous axial images were obtained from the base of the skull through the vertex without intravenous contrast. RADIATION DOSE REDUCTION: This exam was performed according to the departmental dose-optimization program which includes automated exposure control, adjustment of the mA and/or kV according to patient size and/or use of iterative reconstruction technique. COMPARISON:  CT head without contrast 04/12/2023. FINDINGS: Brain: A remote lacunar infarct is present in the right caudate head and anterior limb of the right internal capsule. Remote ischemic changes are again noted in the thalami bilaterally. No acute infarct, hemorrhage, or mass lesion is present. Atrophy and white matter changes are moderately advanced for age. The ventricles are proportionate to the degree of atrophy. No significant extraaxial fluid collection is present. A left paramedian lacunar infarct of the pons is new since the prior exam. Remote lacunar infarcts in the cerebellum bilaterally are stable. Vascular: Atherosclerotic calcifications are present within the  cavernous internal carotid arteries bilaterally. No hyperdense vessel is present. Skull: Insert normal skull No significant extracranial soft tissue lesion is present. Sinuses/Orbits: The paranasal sinuses and mastoid air cells are clear. Bilateral lens replacements are noted. Globes and orbits are otherwise unremarkable. ASPECTS [facility] Stroke Program Early CT Score) - Ganglionic level infarction (caudate, lentiform nuclei, internal capsule, insula, M1-M3 cortex): 7/7 - Supraganglionic infarction (M4-M6 cortex): 3/3 Total score (0-10 with 10 being normal): 10/10 IMPRESSION: 1. Left paramedian pontine lacunar infarct is new since the prior study. It is age indeterminate. MRI could be used for further evaluation if clinically indicated. 2. Aspects is 10/10. 3. Remote lacunar infarcts of the right caudate head and anterior limb of the right internal capsule. 4. Remote ischemic changes in the thalami bilaterally. 5. Remote lacunar infarcts in the cerebellum bilaterally. 6. Atrophy and white matter disease is moderately advanced for age. This likely reflects the sequela of chronic microvascular ischemia. The above was relayed via text pager to Dr. Cleone Dad on 10/28/2023 at 15:22 . Electronically Signed   By: Audree Leas M.D.   On: 10/28/2023 15:23    EKG: Independently reviewed. Sinus rhythm, PAC.   Assessment/Plan   1. Aphasia  - No acute findings on MRI brain; improved with Valium  in ED and focal seizures considered  - Check EEG    2. COPD; bronchiectasis; chronic hypoxic respiratory failure  - Not in exacerbation on admission  - Continue Breztri , as-needed Xoponex, and supplemental O2   3. Chronic HFmrEF  - Appears compensated  - Continue beta-blocker, monitor volume status   4. Type II DM  - A1c was 6.4% in March 2025  - Hold metformin , check CBGs, and use low-intensity SSI for now    5. Anxiety  - Continue low-dose Xanax  as-needed   6. PAF  - Eliquis , Toprol    7. Lung nodule  -  LUL lung nodule noted incidentally on CT in ED  - Outpatient follow-up advised    DVT prophylaxis: Eliquis   Code Status: Full  Level of Care: Level of care: Telemetry Medical Family Communication: Daughter at bedside  Disposition Plan:  Patient is from: ILF  Anticipated d/c is to: TBD Anticipated d/c date is: 10/29/23  Patient currently: Pending EEG, disposition planning  Consults called: Neurology  Admission status: Observation     Walton Guppy, MD [facility]  10/28/2023, 7:53 PM

## 2023-10-28 NOTE — ED Notes (Signed)
 Please give an update to daughter Odilia Bennett), number under emergency contacts

## 2023-10-28 NOTE — Consult Note (Signed)
 NEUROLOGY CONSULT NOTE   Date of service: October 28, 2023 Patient Name: Matthew Hall MRN:  [id number] DOB:  1946-02-06 Chief Complaint: "Code Stroke Aphasia"  History of Present Illness  Matthew Hall is a 78 y.o. male with hx of atrial fibrillation on eliquis , previous stroke affecting right foot, cognitive decline, CAD, COPD, DM with neuropathy, arthritis, and CHF. He spoke with his daughter at 65 in normal state of health (she is a Public relations account executive). She called him back at 1353 and was confused, not understand what she was saying, word salad. Eliquis  was taken this morning between 8am and 9am. He lives at [facility]. At baseline he uses a rolling walker. Self administers medications.    He does have a past medical history of stroke in October 2024.  He was found to have a right basal ganglia infarct and a left frontal lobe infarct at that time.  Since that stroke he does have some weakness in his right foot with dorsiflexion and extension.  No other deficits noted.  He was switched from Coumadin  to Eliquis  at that time.  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  Blood pressure has been well-controlled with his current regimen.  EF at that time was 40-45% with severe right atrial dilation.  He does follow with Dr. Gracie Lav at [facility].  Daughter notes a significant decline in his memory in the past few weeks in particular, with increasing confusion about his medications, missing appointments, recent SLUMS score of 23/30, nonadherence to oxygen , emotional lability with easy crying and other concerns.  However he has never had this type of speech deficit that he is having today  Of note he is frequently nonadherent to his oxygen .  Did not like gabapentin  due to feeling off balance with it (was taking for neuropathy) Felt cymbalta  had risk of stroke on his own research (again was supposed to be taking this for neuropathy) Xanax   he takes 0.25 mg BID without missed doses since his last stroke (has been on this for a long time since at least 2007 but escalated use after his last stroke)  Last known well: 1332 Modified rankin score: 2-Slight disability-UNABLE to perform all activities but does not need assistance IV Thrombolysis: No, on Eliquiis  Thrombectomy: Yes or no (reason)  NIHSS components Score: Comment  1a Level of Conscious 0[x]  1[]  2[]  3[]      1b LOC Questions 0[]  1[]  2[x]       1c LOC Commands 0[]  1[x]  2[]       2 Best Gaze 0[x]  1[]  2[]       3 Visual 0[x]  1[]  2[]  3[]      4 Facial Palsy 0[]  1[x]  2[]  3[]      5a Motor Arm - left 0[x]  1[]  2[]  3[]  4[]  UN[]    5b Motor Arm - Right 0[x]  1[]  2[]  3[]  4[]  UN[]    6a Motor Leg - Left 0[x]  1[]  2[]  3[]  4[]  UN[]    6b Motor Leg - Right 0[]  1[x]  2[]  3[]  4[]  UN[]    7 Limb Ataxia 0[x]  1[]  2[]  3[]  UN[]     8 Sensory 0[x]  1[]  2[]  UN[]      9 Best Language 0[]  1[]  2[x]  3[]      10 Dysarthria 0[]  1[x]  2[]  UN[]      11 Extinct. and Inattention 0[x]  1[]  2[]       TOTAL: 5       ROS   Unable to perform due to aphasia  Past History   Past Medical History:  Diagnosis Date   Anxiety    Aortic atherosclerosis (HCC)    Atrial fibrillation (HCC)    Basal cell carcinoma    Bladder cancer (HCC)    Bladder tumor    Chronic systolic heart failure (HCC)    Colon polyps    COPD (chronic obstructive pulmonary disease) (HCC)    Cutaneous vasculitis    Diabetes (HCC)    Diabetic peripheral neuropathy (HCC) 12/27/2020   Diverticulosis    Essential hypertension    GERD (gastroesophageal reflux disease)    History of pneumonia    Nephrolithiasis    Osteoarthritis    Right foot drop    Stroke [facility])    Past Surgical History:  Procedure Laterality Date   BRONCHIAL WASHINGS  06/19/2023   Procedure: BRONCHIAL WASHINGS;  Surgeon: Joesph Mussel, DO;  Location: [facility];  Service: Cardiopulmonary;;   COLONOSCOPY     Kidney stones removed  1996   SKIN CANCER EXCISION Left     2003   TRANSURETHRAL RESECTION OF BLADDER TUMOR  1996   VIDEO BRONCHOSCOPY N/A 06/19/2023   Procedure: VIDEO BRONCHOSCOPY WITHOUT FLUORO;  Surgeon: Joesph Mussel, DO;  Location: [facility]OS[facility];  Service: Cardiopulmonary;  Laterality: N/A;   Family History  Problem Relation Age of Onset   Diabetes Mother    CAD Mother    Emphysema Father    Diabetes Sister    Stroke Sister    Lung cancer Brother    Liver cancer Brother    Colon cancer Neg Hx    Social History   Socioeconomic History   Marital status: Married    Spouse name: Not on file   Number of children: 3   Years of education: Not on file   Highest education level: Not on file  Occupational History   Occupation: Pipefilter/retired    Comment: [employer]  Tobacco Use   Smoking status: Former    Current packs/day: 0.00    Average packs/day: 1 pack/day for 30.0 years (30.0 ttl pk-yrs)    Types: Cigarettes    Start date: 07/06/1981    Quit date: 07/07/2011    Years since quitting: 12.3   Smokeless tobacco: Never  Vaping Use   Vaping status: Never Used  Substance and Sexual Activity   Alcohol  use: No    Alcohol /week: 0.0 standard drinks of alcohol    Drug use: No   Sexual activity: Not Currently  Other Topics Concern   Not on file  Social History Narrative   Lives with wife   Caffeine- 1 cup a day   Right handed    Social Drivers of Health   Financial Resource Strain: Not on file  Food Insecurity: No Food Insecurity (08/12/2023)   Hunger Vital Sign    Worried About Running Out of Food in the Last Year: Never true    Ran Out of Food in the Last Year: Never true  Recent Concern: Food Insecurity - Food Insecurity Present (06/18/2023)   Hunger Vital Sign    Worried About Running Out of Food in the Last Year: Sometimes true    Ran Out of Food in the Last Year: Sometimes true  Transportation Needs: No Transportation Needs (08/12/2023)   PRAPARE - Administrator, Civil Service (Medical): No    Lack of  Transportation (Non-Medical): No  Recent Concern: Transportation Needs - Unmet Transportation Needs (07/13/2023)   PRAPARE - Administrator, Civil Service (Medical): Yes  Lack of Transportation (Non-Medical): Yes  Physical Activity: Not on file  Stress: Not on file  Social Connections: Moderately Isolated (08/12/2023)   Social Connection and Isolation Panel [NHANES]    Frequency of Communication with Friends and Family: More than three times a week    Frequency of Social Gatherings with Friends and Family: Once a week    Attends Religious Services: Never    Database administrator or Organizations: No    Attends Engineer, structural: Never    Marital Status: Married   Allergies  Allergen Reactions   Simvastatin Rash   Cortisone Nausea And Vomiting   Albuterol  Palpitations and Other (See Comments)    Pt is okay to take xopenex , albuterol  raises his heart rate   Augmentin [Amoxicillin-Pot Clavulanate] Rash   Ciprofloxacin Rash   Contrast Media [Iodinated Contrast Media] Rash   Flagyl  [Metronidazole ] Rash   Latex Hives and Rash   Sulfa  Antibiotics Rash    Medications  No current facility-administered medications for this encounter.  Current Outpatient Medications:    Acetaminophen  (TYLENOL ) 325 MG CAPS, Take 1-2 capsules by mouth every 6 (six) hours as needed (pain)., Disp: , Rfl:    ALPRAZolam  (XANAX ) 0.25 MG tablet, Take 0.25 mg by mouth 2 (two) times daily as needed for anxiety or sleep. , Disp: , Rfl: 3   apixaban  (ELIQUIS ) 5 MG TABS tablet, Take 1 tablet (5 mg total) by mouth 2 (two) times daily., Disp: 60 tablet, Rfl: 1   Budeson-Glycopyrrol-Formoterol  (BREZTRI  AEROSPHERE) 160-9-4.8 MCG/ACT AERO, Inhale 2 puffs into the lungs 2 (two) times daily., Disp: 10.7 g, Rfl: 11   Cyanocobalamin  (VITAMIN B12 PO), Take 1 tablet by mouth daily., Disp: , Rfl:    ELDERBERRY PO, Take 1 tablet by mouth daily., Disp: , Rfl:    feeding supplement (ENSURE ENLIVE / ENSURE  PLUS) LIQD, Take 237 mLs by mouth 2 (two) times daily between meals., Disp: , Rfl:    guaiFENesin  (MUCINEX ) 600 MG 12 hr tablet, Take 1 tablet (600 mg total) by mouth 2 (two) times daily., Disp: 60 tablet, Rfl: 5   hydrochlorothiazide  (HYDRODIURIL ) 12.5 MG tablet, Take 1 tablet (12.5 mg total) by mouth as needed., Disp: 30 tablet, Rfl: 3   levalbuterol  (XOPENEX  HFA) 45 MCG/ACT inhaler, Inhale 2 puffs into the lungs every 6 (six) hours as needed for wheezing., Disp: 15 g, Rfl: 3   levalbuterol  (XOPENEX ) 0.63 MG/3ML nebulizer solution, Take 3 mLs (0.63 mg total) by nebulization every 6 (six) hours as needed for wheezing or shortness of breath., Disp: 270 mL, Rfl: 5   metFORMIN  (GLUCOPHAGE -XR) 500 MG 24 hr tablet, Take 1 tablet (500 mg total) by mouth daily., Disp: 90 tablet, Rfl: 3   metoprolol  succinate (TOPROL -XL) 25 MG 24 hr tablet, Take 1 tablet (25 mg total) by mouth 2 (two) times daily., Disp: 180 tablet, Rfl: 0   nystatin  (MYCOSTATIN ) 100000 UNIT/ML suspension, Take 5 mLs (500,000 Units total) by mouth 4 (four) times daily. (Patient not taking: Reported on 10/03/2023), Disp: 60 mL, Rfl: 0   omeprazole  (PRILOSEC) 20 MG capsule, Take 1 capsule (20 mg total) by mouth daily., Disp: 90 capsule, Rfl: 2   triamcinolone  (NASACORT ) 55 MCG/ACT AERO nasal inhaler, Place 1 spray into the nose daily., Disp: 16.9 mL, Rfl: 5   Vitals   There were no vitals filed for this visit.   There is no height or weight on file to calculate BMI.  Physical Exam   Constitutional: Appears well-developed and  well-nourished.  Psych: Affect appropriate to situation.  Eyes: No scleral injection.  HENT: No OP obstruction.  Head: Normocephalic.  Cardiovascular: Normal rate and regular rhythm.  Respiratory: Effort normal, non-labored breathing. 2LNC baseline  GI: Soft.  No distension. There is no tenderness.  Skin: WDI.   Neurologic Examination   Neuro: Mental Status: Patient is awake, alert, name but when asked age  he repeats him name.  He appears to have a receptive and productive aphasia with difficulty repeating as well as naming on initial evaluation Cranial Nerves: II: Visual Fields are full to blink to threat. Pupils are equal, round, and reactive to light.   III,IV, VI: EOMI to orienting to examiner's voice V: Facial sensation is symmetric to temperature VII: Facial movement is notable for slight right facial droop on initial examination VIII: Hearing is intact to voice Motor: No drift of the bilateral upper extremities.  Slight drift of the right leg on initial evaluation; Hx of right foot weakness since his prior stroke Sensory: Equally reactive to touch in all 4 extremities.  No clear extinction.   cerebellar: FNF and HKS are intact bilaterally   Labs   CBC:  Recent Labs  Lab 10/28/23 1507 10/28/23 1511  WBC 10.3  --   NEUTROABS 7.1  --   HGB 12.2* 13.3  HCT 40.4 39.0  MCV 95.3  --   PLT 289  --    Basic Metabolic Panel:  Lab Results  Component Value Date   NA 138 10/28/2023   K 4.1 10/28/2023   CO2 26 10/28/2023   GLUCOSE 109 (H) 10/28/2023   BUN 10 10/28/2023   CREATININE 0.81 10/28/2023   CALCIUM  8.7 (L) 10/28/2023   GFRNONAA >60 10/28/2023   GFRAA >60 08/09/2017   Lipid Panel:  Lab Results  Component Value Date   LDLCALC 69 09/10/2023   HgbA1c:  Lab Results  Component Value Date   HGBA1C 6.4 09/10/2023   Urine Drug Screen:     Component Value Date/Time   LABOPIA NONE DETECTED 10/28/2023 1550   COCAINSCRNUR NONE DETECTED 10/28/2023 1550   LABBENZ POSITIVE (A) 10/28/2023 1550   AMPHETMU NONE DETECTED 10/28/2023 1550   THCU NONE DETECTED 10/28/2023 1550   LABBARB NONE DETECTED 10/28/2023 1550    Alcohol  Level     Component Value Date/Time   ETH <10 04/12/2023 0939   INR  Lab Results  Component Value Date   INR 1.8 (H) 07/12/2023   APTT  Lab Results  Component Value Date   APTT 40 (H) 04/12/2023   CT Head without contrast(Personally  reviewed): 1. Left paramedian pontine lacunar infarct is new since the prior study. It is age indeterminate. MRI could be used for further evaluation if clinically indicated. 2. Aspects is 10/10. 3. Remote lacunar infarcts of the right caudate head and anterior limb of the right internal capsule. 4. Remote ischemic changes in the thalami bilaterally. 5. Remote lacunar infarcts in the cerebellum bilaterally. 6. Atrophy and white matter disease is moderately advanced for age. This likely reflects the sequela of chronic microvascular ischemia.  CT angio Head and Neck with contrast(Personally reviewed): Atherosclerotic calcifications at the right ICA bifurcation and cavernous internal carotid arteries bilaterally without significant stenosis relative to the more distal vessels.  MRI brain without contrast Negative on my preliminary review for acute stroke, full radiology report pending  Assessment   Matthew Hall is a 78 y.o. male presenting with aphasia.  He does have a history of paroxysmal atrial fibrillation  and is currently anticoagulated with Eliquis .  Plan for MRI brain without contrast to confirm safety of continuing Eliquis .  Interestingly daughter notes he continued to have fluctuating aphasia during the rest of his time in the ED.  This seemed to improve a great deal after Valium  administration per her report.  I feel that focal seizures more likely to be an explanation rather than simple anxiety given that the speech impairment did appear to be a true aphasia.  However we will hold off on further seizure medications for now to allow diagnostic clarity.  Recommendations  - He has a recent A1c and LDL that are meeting goal - MRI brain without contrast ordered if this is negative does not need any further stroke workup and can continue Eliquis  - Consider full outpatient neurocognitive workup for daughters neurocognitive concerns - Highly recommend weaning Ativan; however will need to  be gradually tapered given that he takes this twice a day very regularly - Admit for EEG (ordered) and observation - Hold off on antiseizure medications for now to increase diagnostic utility of EEG - Neurology will follow in consultation ______________________________________________________________________   Signed, Imogene Mana, NP [facility]  Attending Neurologist's note:  I personally saw this patient, gathering history, performing a neurologic examination, reviewing relevant labs, personally reviewing relevant imaging including head CT, CTA head and neck, MRI brain, and formulated the assessment and plan, adding the note above for completeness and clarity to accurately reflect my thoughts  Baldwin Levee MD-PhD [facility] [phone] Available 7 AM to 7 PM, outside these hours please contact Neurologist on call listed on AMION   CRITICAL CARE Performed by: Ronnette Coke   Total critical care time: 45 minutes  Critical care time was exclusive of separately billable procedures and treating other patients.  Critical care was necessary to treat or prevent imminent or life-threatening deterioration -- emergent evaluation for consideration of thrombectomy or thrombolytic  Critical care was time spent personally by me on the following activities: development of treatment plan with patient and/or surrogate as well as nursing, discussions with consultants, evaluation of patient's response to treatment, examination of patient, obtaining history from patient or surrogate, ordering and performing treatments and interventions, ordering and review of laboratory studies, ordering and review of radiographic studies, pulse oximetry and re-evaluation of patient's condition.

## 2023-10-29 ENCOUNTER — Observation Stay (HOSPITAL_COMMUNITY)

## 2023-10-29 ENCOUNTER — Other Ambulatory Visit: Payer: Self-pay

## 2023-10-29 ENCOUNTER — Other Ambulatory Visit (HOSPITAL_BASED_OUTPATIENT_CLINIC_OR_DEPARTMENT_OTHER): Payer: Self-pay

## 2023-10-29 ENCOUNTER — Other Ambulatory Visit (HOSPITAL_COMMUNITY): Payer: Self-pay

## 2023-10-29 DIAGNOSIS — R4701 Aphasia: Secondary | ICD-10-CM | POA: Diagnosis not present

## 2023-10-29 DIAGNOSIS — F419 Anxiety disorder, unspecified: Secondary | ICD-10-CM

## 2023-10-29 DIAGNOSIS — G3184 Mild cognitive impairment, so stated: Secondary | ICD-10-CM | POA: Diagnosis not present

## 2023-10-29 DIAGNOSIS — E876 Hypokalemia: Secondary | ICD-10-CM | POA: Diagnosis not present

## 2023-10-29 DIAGNOSIS — R2990 Unspecified symptoms and signs involving the nervous system: Secondary | ICD-10-CM

## 2023-10-29 DIAGNOSIS — R569 Unspecified convulsions: Secondary | ICD-10-CM

## 2023-10-29 LAB — BASIC METABOLIC PANEL WITH GFR
Anion gap: 8 (ref 5–15)
BUN: 6 mg/dL — ABNORMAL LOW (ref 8–23)
CO2: 27 mmol/L (ref 22–32)
Calcium: 8.3 mg/dL — ABNORMAL LOW (ref 8.9–10.3)
Chloride: 100 mmol/L (ref 98–111)
Creatinine, Ser: 0.78 mg/dL (ref 0.61–1.24)
GFR, Estimated: >60 mL/min (ref >60)
Glucose, Bld: 95 mg/dL (ref 70–99)
Potassium: 3.1 mmol/L — ABNORMAL LOW (ref 3.5–5.1)
Sodium: 135 mmol/L (ref 135–145)

## 2023-10-29 LAB — CBC
HCT: 36.2 % — ABNORMAL LOW (ref 39.0–52.0)
Hemoglobin: 11.6 g/dL — ABNORMAL LOW (ref 13.0–17.0)
MCH: 29.6 pg (ref 26.0–34.0)
MCHC: 32 g/dL (ref 30.0–36.0)
MCV: 92.3 fL (ref 80.0–100.0)
Platelets: 284 10*3/uL (ref 150–400)
RBC: 3.92 MIL/uL — ABNORMAL LOW (ref 4.22–5.81)
RDW: 14 % (ref 11.5–15.5)
WBC: 9.9 10*3/uL (ref 4.0–10.5)
nRBC: 0 % (ref 0.0–0.2)

## 2023-10-29 LAB — MAGNESIUM: Magnesium: 1.4 mg/dL — ABNORMAL LOW (ref 1.7–2.4)

## 2023-10-29 LAB — GLUCOSE, CAPILLARY: Glucose-Capillary: 158 mg/dL — ABNORMAL HIGH (ref 70–99)

## 2023-10-29 MED ORDER — INSULIN ASPART 100 UNIT/ML IJ SOLN
0.0000 [IU] | Freq: Three times a day (TID) | INTRAMUSCULAR | Status: DC
  Administered 2023-10-30: 1 [IU] via SUBCUTANEOUS

## 2023-10-29 MED ORDER — POTASSIUM CHLORIDE CRYS ER 20 MEQ PO TBCR
40.0000 meq | EXTENDED_RELEASE_TABLET | Freq: Two times a day (BID) | ORAL | Status: AC
  Administered 2023-10-29 (×2): 40 meq via ORAL
  Filled 2023-10-29 (×2): qty 2

## 2023-10-29 MED ORDER — MAGNESIUM SULFATE 4 GM/100ML IV SOLN
4.0000 g | Freq: Once | INTRAVENOUS | Status: AC
  Administered 2023-10-29: 4 g via INTRAVENOUS
  Filled 2023-10-29: qty 100

## 2023-10-29 NOTE — Progress Notes (Signed)
 PROGRESS NOTE                                                                                                                                                                                                             Patient Demographics:    Matthew Hall, is a 78 y.o. male, DOB - 02-Sep-1945, ZOX:[id number]  Outpatient Primary MD for the patient is Mandy Second, [place]    LOS - 0  Admit date - 10/28/2023    Chief Complaint  Patient presents with   Code Stroke       Brief Narrative (HPI from H&P)    Matthew Hall is a 78 y.o. 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   Patient was in his usual state when speaking with his daughter at 1:32 PM today.  She spoke with him again 30 minutes later and he was confused, repeated that he was unable to understand what she was saying, and was speaking unintelligibly. Symptoms have been waxing and waning in the ED and reportedly improved significantly after the patient was given Valium  in preparation for MRI.   ED Course: Upon arrival to the ED, patient is found to be afebrile and saturating well on 2 L/min of supplemental oxygen  with elevated blood pressure.  EKG demonstrates sinus rhythm with PAC.  CTA head and neck is negative for large vessel occlusion but notable for left upper lobe lung nodule.  MRI brain is negative for acute abnormality but reveals remote lacunar infarctions and chronic microvascular ischemic disease.   Neurology was consulted by the ED physician and has recommended observation in the hospital for EEG.   Subjective:    [name] was evaluated at the bed  side.  Laying comfortably in bed.  Speech is clear and comprehensible.  States he had trouble coming up with words yesterday.  Also reports not taking his home Xanax  for last few days   Assessment  & Plan :    Assessment and Plan: # Aphasia  - No acute findings on MRI brain; improved with Valium  in ED - EEG also within normal limits -  Symptoms are completely resolved today - Neurology following, appreciate recs - Pending PT/OT/SLP eval  # Hypokalemia - In the setting of hypomagnesemia - K+ 3.1, repleted with KCl 40 mEq x 2 - Follow-up morning BMP  # Hypomagnesemia - Mag 1.4, repleted with IV mag 4 g - Follow-up morning mag   # COPD; bronchiectasis; chronic hypoxic respiratory failure  - On home oxygen  at 2 L [place] - Not in exacerbation on admission  - Continue Breztri , as-needed Xoponex, guaifenesin , and supplemental O2    # Chronic HFmrEF  - Patient euvolemic on exam - Continue beta-blocker, monitor volume status    # Type II DM  - A1c was 6.4% in March 2025  - SSI with meals, CBG monitoring - Continue to hold metformin    # Anxiety  - Continue low-dose Xanax  as-needed    # PAF  - Remains in A-fib with HR in the 50s to 60s - Continue Eliquis  and Toprol  XL    # Lung nodule  - LUL lung nodule noted incidentally on CT in ED  - Outpatient follow-up advised   # GERD - Continue Protonix         Condition -stable  Family Communication  : No family at bedside  Code Status : Full  Consults  : Neurology  PUD Prophylaxis : Protonix    Procedures  :     None      Disposition Plan  :    Status is: Observation The patient remains OBS appropriate and will d/c before 2 midnights.  DVT Prophylaxis  :     apixaban  (ELIQUIS ) tablet 5 mg     Lab Results  Component Value Date   PLT 284 10/29/2023    Diet :  Diet Order             Diet Heart Room service appropriate? Yes; Fluid consistency: Thin  Diet effective now                    Inpatient  Medications  Scheduled Meds:  apixaban   5 mg Oral BID   budeson-glycopyrrolate -formoterol   2 puff Inhalation BID   guaiFENesin   600 mg Oral BID   hydrochlorothiazide   12.5 mg Oral Daily   metoprolol  succinate  25 mg Oral BID   pantoprazole   40 mg Oral Daily   sodium chloride  flush  3 mL Intravenous Q12H   Continuous Infusions: PRN Meds:.acetaminophen  **OR** acetaminophen , ALPRAZolam , levalbuterol , prochlorperazine , senna-docusate  Antibiotics  :    Anti-infectives (From admission, onward)    None         Objective:   Vitals:   10/29/23 0300 10/29/23 0400 10/29/23 0600 10/29/23 0700  BP: 122/66 (!) 110/56 (!) 111/45 124/61  Pulse: (!) 57 (!) 59 66 (!) 58  Resp: 20 (!) 22 (!) 26 (!) 24  Temp:  98 F (36.7 C)    TempSrc:  Oral    SpO2: 97% 98% 99% 97%  Weight:      Height:        Wt Readings from Last 3 Encounters:  10/28/23 65.6 kg  10/25/23 65.6 kg  10/03/23 68.9 kg     Intake/Output Summary (Last 24 hours) at 10/29/2023 0901 Last data filed at 10/29/2023 0038 Gross per 24 hour  Intake --  Output 150 ml  Net -150 ml     Physical Exam  General: Pleasant, chronically ill elderly man laying in bed. No acute distress. HEENT: [place]/AT. Anicteric sclera.  EOMI. CV: Bradycardic. Irregularly irregular rhythm.  No murmurs, rubs, or gallops. No LE edema Pulmonary: On 2L [place].  Lungs CTAB. Normal effort. No wheezing or rales. Abdominal: Soft, NT/ND. Normal bowel sounds. Extremities: Palpable radial and DP pulses. Normal ROM. Skin: Warm and dry. No obvious rash or lesions. Neuro: A&Ox3. Speech is clear and comprehensible. Moves all extremities. BLE with 4/5 strength, mild drift on the RLE.  Right foot with weak plantarflexion and dorsiflexion. Psych: Normal mood and affect    RN pressure injury documentation:      Data Review:    Recent Labs  Lab 10/28/23 1507 10/28/23 1511 10/29/23 0602  WBC 10.3  --  9.9  HGB 12.2* 13.3 11.6*  HCT 40.4 39.0 36.2*  PLT  289  --  284  MCV 95.3  --  92.3  MCH 28.8  --  29.6  MCHC 30.2  --  32.0  RDW 14.3  --  14.0  LYMPHSABS 1.7  --   --   MONOABS 1.2*  --   --   EOSABS 0.2  --   --   BASOSABS 0.1  --   --     Recent Labs  Lab 10/28/23 1507 10/28/23 1511 10/28/23 1615 10/29/23 0602  NA  --  141 138 135  K  --  3.6 4.1 3.1*  CL  --  102 101 100  CO2  --   --  26 27  ANIONGAP  --   --  11 8  GLUCOSE  --  107* 109* 95  BUN  --  12 10 6*  CREATININE  --  0.90 0.81 0.78  AST  --   --  31  --   ALT  --   --  12  --   ALKPHOS  --   --  43  --   BILITOT  --   --  1.1  --   ALBUMIN  --   --  3.1*  --   INR 1.4*  --   --   --   MG  --   --   --  1.4*  CALCIUM   --   --  8.7* 8.3*      Recent Labs  Lab 10/28/23 1507 10/28/23 1615 10/29/23 0602  INR 1.4*  --   --   MG  --   --  1.4*  CALCIUM   --  8.7* 8.3*    --------------------------------------------------------------------------------------------------------------- Lab Results  Component Value Date   CHOL 118 09/10/2023   HDL 31.40 (L) 09/10/2023   LDLCALC 69 09/10/2023   TRIG 89.0 09/10/2023   CHOLHDL 4 09/10/2023    Lab Results  Component Value Date   HGBA1C 6.4 09/10/2023   No results for input(s): "TSH", "T4TOTAL", "FREET4", "T3FREE", "THYROIDAB" in the last 72 hours. No results for input(s): "VITAMINB12", "FOLATE", "FERRITIN", "TIBC", "IRON", "RETICCTPCT" in the last 72 hours. ------------------------------------------------------------------------------------------------------------------ Cardiac Enzymes No results for input(s): "CKMB", "TROPONINI", "MYOGLOBIN" in the last 168 hours.  Invalid input(s): "CK"  Micro Results No results found for this or any previous visit (from the past 240 hours).  Radiology Reports MR BRAIN WO CONTRAST Result Date: 10/28/2023 CLINICAL DATA:  Neuro deficit, acute, stroke suspected EXAM: MRI HEAD WITHOUT CONTRAST TECHNIQUE: Multiplanar, multiecho pulse sequences of the brain and  surrounding structures were obtained without intravenous contrast. COMPARISON:  CT head from today. FINDINGS: Brain: No acute infarction, hemorrhage, hydrocephalus, extra-axial collection or mass lesion. Remote right cerebellar and pontine infarcts. Patchy T2/FLAIR hyperintensities the white matter, compatible with chronic microvascular  ischemic disease. Cerebral atrophy. Vascular: Major arterial flow voids are maintained. Skull and upper cervical spine: Normal marrow signal. Sinuses/Orbits: Clear sinuses.  No acute orbital findings. IMPRESSION: 1. No evidence of acute intracranial abnormality. 2. Remote lacunar infarcts and chronic microvascular ischemic disease. Electronically Signed   By: Stevenson Elbe M.D.   On: 10/28/2023 19:08   CT ANGIO HEAD NECK W WO CM (CODE STROKE) Result Date: 10/28/2023 CLINICAL DATA:  Neuro deficit, acute, stroke suspected.  Aphasia EXAM: CT ANGIOGRAPHY HEAD AND NECK WITH AND WITHOUT CONTRAST TECHNIQUE: Multidetector CT imaging of the head and neck was performed using the standard protocol during bolus administration of intravenous contrast. Multiplanar CT image reconstructions and MIPs were obtained to evaluate the vascular anatomy. Carotid stenosis measurements (when applicable) are obtained utilizing NASCET criteria, using the distal internal carotid diameter as the denominator. RADIATION DOSE REDUCTION: This exam was performed according to the departmental dose-optimization program which includes automated exposure control, adjustment of the mA and/or kV according to patient size and/or use of iterative reconstruction technique. CONTRAST:  75mL OMNIPAQUE  IOHEXOL  350 MG/ML SOLN COMPARISON:  MR angiogram circle of Willis 04/12/2023. FINDINGS: CTA NECK FINDINGS Aortic arch: Atherosclerotic calcifications are present in the aorta and great vessel origins without focal stenosis. No aneurysm or dissection is present. Right carotid system: The right common carotid artery is within  normal limits. Atherosclerotic calcifications are present at the right ICA bifurcation without significant stenosis relative to the more distal vessel. The cervical right ICA is otherwise normal. Left carotid system: The left common carotid artery is within normal limits. Bifurcation is unremarkable. The cervical left ICA is normal. Vertebral arteries: Left vertebral artery is the dominant vessel. Both vertebral arteries originate from the subclavian arteries without significant stenosis. No significant stenosis is present in either vertebral artery in the neck. Skeleton: Endplate degenerative changes are most evident at C5-6 and C6-7. Slight degenerative anterolisthesis is present at C5-6. Alignment is otherwise anatomic. No focal osseous lesions are present. Other neck: The soft tissues of the neck are unremarkable. Salivary glands are within normal limits. Thyroid  is normal. No significant adenopathy is present. No focal mucosal or submucosal lesions are present. Upper chest: 2 separate 4 mm nodules in the right upper lobe are noted on images 22 and 27 of series 5. Scarring is present at the right lung apex. A 7 mm nodule is present in the left upper lobe on image 24 of series 5. Review of the MIP images confirms the above findings CTA HEAD FINDINGS Anterior circulation: Atherosclerotic calcifications are present within the cavernous internal carotid arteries bilaterally. No significant stenosis is present through the ICA terminus. The A1 and M1 segments are normal. The anterior communicating artery is patent. The MCA bifurcations are normal bilaterally. The ACA and MCA branch vessels are normal bilaterally. Posterior circulation: The left vertebral artery is dominant. PICA origin is visualized and normal. Vertebrobasilar junction and basilar artery are normal. The superior cerebellar arteries are patent. Both posterior cerebral arteries originate from basilar tip. The PCA branch vessels are normal bilaterally.  Venous sinuses: The dural sinuses are patent. The straight sinus and deep cerebral veins are intact. Cortical veins are within normal limits. No significant vascular malformation is evident. Anatomic variants: None Review of the MIP images confirms the above findings IMPRESSION: 1. No emergent large vessel occlusion. 2. Atherosclerotic calcifications at the right ICA bifurcation and cavernous internal carotid arteries bilaterally without significant stenosis relative to the more distal vessels. 3. Normal variant CTA Circle of  Willis without significant proximal stenosis, aneurysm, or branch vessel occlusion. 4. 7 mm nodule in the left upper lobe. Solid pulmonary nodule measuring 7 mm. Per Fleischner Society Guidelines, recommend a non-contrast Chest CT at 6-12 months. If patient is high risk for malignancy, consider an additional non-contrast Chest CT at 18-24 months. If patient is low risk for malignancy, non-contrast Chest CT at 18-24 months is optional. These guidelines do not apply to immunocompromised patients and patients with cancer. Follow up in patients with significant comorbidities as clinically warranted. For lung cancer screening, adhere to Lung-RADS guidelines. Reference: Radiology. 2017; 284(1):228-43. Electronically Signed   By: Audree Leas M.D.   On: 10/28/2023 15:38   CT HEAD CODE STROKE WO CONTRAST Result Date: 10/28/2023 CLINICAL DATA:  Code stroke. Neuro deficit, acute, stroke suspected. Aphasia. EXAM: CT HEAD WITHOUT CONTRAST TECHNIQUE: Contiguous axial images were obtained from the base of the skull through the vertex without intravenous contrast. RADIATION DOSE REDUCTION: This exam was performed according to the departmental dose-optimization program which includes automated exposure control, adjustment of the mA and/or kV according to patient size and/or use of iterative reconstruction technique. COMPARISON:  CT head without contrast 04/12/2023. FINDINGS: Brain: A remote lacunar  infarct is present in the right caudate head and anterior limb of the right internal capsule. Remote ischemic changes are again noted in the thalami bilaterally. No acute infarct, hemorrhage, or mass lesion is present. Atrophy and white matter changes are moderately advanced for age. The ventricles are proportionate to the degree of atrophy. No significant extraaxial fluid collection is present. A left paramedian lacunar infarct of the pons is new since the prior exam. Remote lacunar infarcts in the cerebellum bilaterally are stable. Vascular: Atherosclerotic calcifications are present within the cavernous internal carotid arteries bilaterally. No hyperdense vessel is present. Skull: Insert normal skull No significant extracranial soft tissue lesion is present. Sinuses/Orbits: The paranasal sinuses and mastoid air cells are clear. Bilateral lens replacements are noted. Globes and orbits are otherwise unremarkable. ASPECTS [facility] Stroke Program Early CT Score) - Ganglionic level infarction (caudate, lentiform nuclei, internal capsule, insula, M1-M3 cortex): 7/7 - Supraganglionic infarction (M4-M6 cortex): 3/3 Total score (0-10 with 10 being normal): 10/10 IMPRESSION: 1. Left paramedian pontine lacunar infarct is new since the prior study. It is age indeterminate. MRI could be used for further evaluation if clinically indicated. 2. Aspects is 10/10. 3. Remote lacunar infarcts of the right caudate head and anterior limb of the right internal capsule. 4. Remote ischemic changes in the thalami bilaterally. 5. Remote lacunar infarcts in the cerebellum bilaterally. 6. Atrophy and white matter disease is moderately advanced for age. This likely reflects the sequela of chronic microvascular ischemia. The above was relayed via text pager to Dr. Cleone Dad on 10/28/2023 at 15:22 . Electronically Signed   By: Audree Leas M.D.   On: 10/28/2023 15:23      Signature  -   Vita Grip M.D on 10/29/2023 at 9:01 AM   -   To page go to [email or web address]

## 2023-10-29 NOTE — ED Notes (Signed)
 Help get patient breakfast tray sat up patient has call bell in reach

## 2023-10-29 NOTE — TOC Initial Note (Signed)
 Transition of Care [facility]) - Initial/Assessment Note    Patient Details  Name: Matthew Hall MRN: [id number] Date of Birth: 1946-05-23  [facility]) CM/SW Contact:    Jonathan Neighbor, RN Phone Number: 10/29/2023, 3:58 PM  Clinical Narrative:                  Pt is from [facility]. Pt lives in an apartment with his spouse. However his spouse just got out of rehab and now is in the hospital.  Pt uses oxygen  at night to sleep and a rollator all the time.  [employer] provides his needed transportation. Pt manages his own medications. TOC following.  Expected Discharge Plan: Home/Self Care Barriers to Discharge: Continued Medical Work up   Patient Goals and CMS Choice            Expected Discharge Plan and Services       Living arrangements for the past 2 months: Apartment, Independent Living Facility                                      Prior Living Arrangements/Services Living arrangements for the past 2 months: Apartment, Independent Living Facility Lives with:: Spouse Patient language and need for interpreter reviewed:: Yes Do you feel safe going back to the place where you live?: Yes        Care giver support system in place?: No (comment) Current home services: DME (oxygen / rollator) Criminal Activity/Legal Involvement Pertinent to Current Situation/Hospitalization: No - Comment as needed  Activities of Daily Living      Permission Sought/Granted                  Emotional Assessment Appearance:: Appears stated age Attitude/Demeanor/Rapport: Engaged Affect (typically observed): Accepting Orientation: : Oriented to Self, Oriented to Place, Oriented to  Time, Oriented to Situation   Psych Involvement: No (comment)  Admission diagnosis:  Aphasia [R47.01] Stroke-like symptoms [R29.90] Patient Active Problem List   Diagnosis Date Noted   Aphasia 10/28/2023   Lung nodule 10/28/2023   Chronic respiratory failure  with hypoxia (HCC) 10/25/2023   Gait abnormality 09/24/2023   Prolonged QT interval 07/13/2023   DM2 (diabetes mellitus, type 2) (HCC) 07/13/2023   Paroxysmal atrial fibrillation (HCC) 07/13/2023   GAD (generalized anxiety disorder) 07/13/2023   Chronic heart failure with mildly reduced ejection fraction (HFmrEF, 41-49%) (HCC) 07/13/2023   SOB (shortness of breath) 07/13/2023   Malnutrition of moderate degree 07/13/2023   Acute respiratory failure with hypoxia (HCC) 07/12/2023   Pain due to onychomycosis of toenails of both feet 07/02/2023   Paronychia of great toe of left foot 07/02/2023   Bronchiectasis with (acute) exacerbation (HCC) 06/20/2023   Bronchiectasis with acute exacerbation (HCC) 06/19/2023   Pneumonia due to infectious organism 06/19/2023   COPD with acute exacerbation (HCC) 06/18/2023   Multifocal pneumonia 06/17/2023   Sepsis (HCC) 06/17/2023   Physical deconditioning 06/17/2023   Weakness of right foot 05/09/2023   Cerebrovascular accident (CVA) (HCC) 04/12/2023   Anxiety 04/12/2023   Diabetic peripheral neuropathy (HCC) 12/27/2020   Paresthesia 12/01/2020   Pneumonia due to COVID-19 virus 10/29/2020   Hypokalemia 10/29/2020   Healthcare maintenance 07/25/2019   Leukocytosis 08/11/2017   Elevated sed rate 05/21/2017   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) 03/01/2017   Vasculitis (HCC) 02/20/2017   Atrial fibrillation, chronic (HCC) 07/09/2016   BRONCHIECTASIS W/O ACUTE EXACERBATION 03/31/2008  Essential hypertension 09/13/2007   Allergic rhinitis 09/13/2007   EMPHYSEMA 09/13/2007   COPD exacerbation (HCC) 09/13/2007   PCP:  Mandy Second, PA Pharmacy:   [facility] [address] [place] [address] Phone: [phone] Fax: [phone]  [facility] [phone] [facility] [place], [place] - [address] AT CORNER OF [facility] [address] [place] [address] Phone: [phone] Fax:  [phone]  [facility] - [place], [place] - [address] [place] [address] Phone: [phone] Fax: [phone]  [facility] - [facility] [address] [place] [address] Phone: [phone] Fax: [phone]     Social Drivers of Health (SDOH) Social History: SDOH Screenings   Food Insecurity: No Food Insecurity (08/12/2023)  Recent Concern: Food Insecurity - Food Insecurity Present (06/18/2023)  Housing: Low Risk  (08/12/2023)  Transportation Needs: No Transportation Needs (08/12/2023)  Recent Concern: Transportation Needs - Unmet Transportation Needs (07/13/2023)  Utilities: Not At Risk (08/12/2023)  Depression (PHQ2-9): Low Risk  (10/03/2023)  Social Connections: Moderately Isolated (08/12/2023)  Tobacco Use: Medium Risk (10/28/2023)   SDOH Interventions:     Readmission Risk Interventions    07/15/2023    2:01 PM 06/22/2023   11:49 AM  Readmission Risk Prevention Plan  Transportation Screening Complete Complete  Home Care Screening  Complete  Medication Review (RN CM)  Complete  Medication Review (RN Care Manager) Complete   PCP or Specialist appointment within 3-5 days of discharge Complete   HRI or Home Care Consult Complete   SW Recovery Care/Counseling Consult Complete   Palliative Care Screening Not Applicable   Skilled Nursing Facility Not Applicable

## 2023-10-29 NOTE — Procedures (Signed)
 Patient Name: Matthew Hall  MRN: [id number]  Epilepsy Attending: Arleene Lack  Referring Physician/Provider: Ronnette Coke, MD  Date: 10/29/2023 Duration: 23 mins   Patient history: 78yo M with aphasia. EEG to evaluate for seizure  Level of alertness: Awake  AEDs during EEG study: None  Technical aspects: This EEG study was done with scalp electrodes positioned according to the 10-20 International system of electrode placement. Electrical activity was reviewed with band pass filter of 1-70Hz , sensitivity of 7 uV/mm, display speed of 12mm/sec with a 60Hz  notched filter applied as appropriate. EEG data were recorded continuously and digitally stored.  Video monitoring was available and reviewed as appropriate.  Description: The posterior dominant rhythm consists of 8 Hz activity of moderate voltage (25-35 uV) seen predominantly in posterior head regions, symmetric and reactive to eye opening and eye closing. Hyperventilation and photic stimulation were not performed.     IMPRESSION: This study is within normal limits. No seizures or epileptiform discharges were seen throughout the recording.  A normal interictal EEG does not exclude the diagnosis of epilepsy.   Larenda Reedy O Alanzo Lamb

## 2023-10-29 NOTE — Progress Notes (Signed)
Routine EEG complete. Results pending.

## 2023-10-29 NOTE — Care Management Obs Status (Signed)
 MEDICARE OBSERVATION STATUS NOTIFICATION   Patient Details  Name: Matthew Hall MRN: [id number] Date of Birth: [birth year]   Medicare Observation Status Notification Given:  Yes    Jonathan Neighbor, RN 10/29/2023, 3:54 PM

## 2023-10-30 ENCOUNTER — Other Ambulatory Visit: Payer: Self-pay

## 2023-10-30 DIAGNOSIS — R2990 Unspecified symptoms and signs involving the nervous system: Secondary | ICD-10-CM | POA: Diagnosis not present

## 2023-10-30 DIAGNOSIS — R4701 Aphasia: Secondary | ICD-10-CM | POA: Diagnosis not present

## 2023-10-30 LAB — BASIC METABOLIC PANEL WITH GFR
Anion gap: 8 (ref 5–15)
BUN: 10 mg/dL (ref 8–23)
CO2: 26 mmol/L (ref 22–32)
Calcium: 9.1 mg/dL (ref 8.9–10.3)
Chloride: 102 mmol/L (ref 98–111)
Creatinine, Ser: 0.98 mg/dL (ref 0.61–1.24)
GFR, Estimated: >60 mL/min (ref >60)
Glucose, Bld: 99 mg/dL (ref 70–99)
Potassium: 4 mmol/L (ref 3.5–5.1)
Sodium: 136 mmol/L (ref 135–145)

## 2023-10-30 LAB — GLUCOSE, CAPILLARY
Glucose-Capillary: 106 mg/dL — ABNORMAL HIGH (ref 70–99)
Glucose-Capillary: 150 mg/dL — ABNORMAL HIGH (ref 70–99)
Glucose-Capillary: 105 mg/dL — ABNORMAL HIGH (ref 70–99)
Glucose-Capillary: 196 mg/dL — ABNORMAL HIGH (ref 70–99)

## 2023-10-30 LAB — CBC
HCT: 40.4 % (ref 39.0–52.0)
Hemoglobin: 12.9 g/dL — ABNORMAL LOW (ref 13.0–17.0)
MCH: 28.7 pg (ref 26.0–34.0)
MCHC: 31.9 g/dL (ref 30.0–36.0)
MCV: 90 fL (ref 80.0–100.0)
Platelets: 292 10*3/uL (ref 150–400)
RBC: 4.49 MIL/uL (ref 4.22–5.81)
RDW: 14 % (ref 11.5–15.5)
WBC: 9.6 10*3/uL (ref 4.0–10.5)
nRBC: 0 % (ref 0.0–0.2)

## 2023-10-30 LAB — MAGNESIUM: Magnesium: 1.9 mg/dL (ref 1.7–2.4)

## 2023-10-30 NOTE — Progress Notes (Signed)
 PROGRESS NOTE                                                                                                                                                                                                             Patient Demographics:    Matthew Hall, is a 78 y.o. male, DOB - [birth year], ZHY:[id number]  Outpatient Primary MD for the patient is Mandy Second, [place]    LOS - 0  Admit date - 10/28/2023    Chief Complaint  Patient presents with   Code Stroke       Brief Narrative (HPI from H&P)    Matthew Hall is a 78 y.o. 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   Patient was in his usual state when speaking with his daughter at 1:32 PM today.  She spoke with him again 30 minutes later and he was confused, repeated that he was unable to understand what she was saying, and was speaking unintelligibly. Symptoms have been waxing and waning in the ED and reportedly improved significantly after the patient was given Valium  in preparation for MRI.   ED Course: Upon arrival to the ED, patient is found to be afebrile and saturating well on 2 L/min of supplemental oxygen  with elevated blood pressure.  EKG demonstrates sinus rhythm with PAC.  CTA head and neck is negative for large vessel occlusion but notable for left upper lobe lung nodule.  MRI brain is negative for acute abnormality but reveals remote lacunar infarctions and chronic microvascular ischemic disease.   Neurology was consulted by the ED physician and has recommended observation in the hospital for EEG.   Subjective:    Matthew Hall was evaluated at the bed  side.  Hide just finished working with PT. he has no acute concerns. Spouse has also been hospitalized at the hospital.   Assessment  & Plan :    Assessment and Plan: # Aphasia  - No acute findings on MRI brain; improved with Valium  in ED - EEG also within normal limits - His speech remains clear and comprehensible - Neurology signed off, recommending outpatient neurology  follow-up - PT recommending home with home health, will likely return to ILF/ALF at [facility] - SLP also recommended home health SLP  # Hypokalemia, resolved - K+ 4.0 - Follow-up morning BMP  # Hypomagnesemia, resolved - Mag 1.9 today   # COPD; bronchiectasis; chronic hypoxic respiratory failure  - On home oxygen  at 2 L [place] - Not in exacerbation on admission  - Continue Breztri , as-needed Xoponex, guaifenesin , and supplemental O2    # Chronic HFmrEF  - Patient euvolemic on exam - Continue beta-blocker, monitor volume status    # Type II DM  - A1c was 6.4% in March 2025  - SSI with meals, CBG monitoring - Continue to hold metformin , resume at discharge   # Anxiety  - Continue low-dose Xanax  as-needed    # PAF  - Remains in A-fib with HR in the 60s to 70s - Continue Eliquis  and Toprol  XL    # Lung nodule  - LUL lung nodule noted incidentally on CT in ED  - Outpatient follow-up advised   # GERD - Continue Protonix        Condition -stable  Family Communication  : No family at bedside  Code Status : Full  Consults  : Neurology  PUD Prophylaxis : Protonix    Procedures  :     None      Disposition Plan  :    Status is: Observation The patient remains OBS appropriate and will d/c before 2 midnights.  DVT Prophylaxis  :     apixaban  (ELIQUIS ) tablet 5 mg     Lab Results  Component Value Date   PLT 292 10/30/2023    Diet :  Diet Order             Diet Heart Room service appropriate? Yes; Fluid consistency: Thin  Diet effective now                     Inpatient Medications  Scheduled Meds:  apixaban   5 mg Oral BID   budeson-glycopyrrolate -formoterol   2 puff Inhalation BID   guaiFENesin   600 mg Oral BID   hydrochlorothiazide   12.5 mg Oral Daily   insulin  aspart  0-9 Units Subcutaneous TID WC   metoprolol  succinate  25 mg Oral BID   pantoprazole   40 mg Oral Daily   sodium chloride  flush  3 mL Intravenous Q12H   Continuous Infusions: PRN Meds:.acetaminophen  **OR** acetaminophen , ALPRAZolam , levalbuterol , prochlorperazine , senna-docusate  Antibiotics  :    Anti-infectives (From admission, onward)    None         Objective:   Vitals:   10/29/23 2009 10/29/23 2352 10/30/23 0346 10/30/23 0814  BP: 138/79 124/72 134/70 121/65  Pulse: 72 71 61 78  Resp: 17 18 17 20   Temp: 97.6 F (36.4 C) (!) 97.5 F (36.4 C) 97.6 F (36.4 C) 97.7 F (36.5 C)  TempSrc: Oral Axillary Axillary Oral  SpO2: 95% 94% 91% 98%  Weight:      Height:        Wt Readings from Last 3 Encounters:  10/28/23 65.6 kg  10/25/23 65.6 kg  10/03/23 68.9 kg     Intake/Output Summary (Last 24 hours) at 10/30/2023 0846 Last data filed at 10/29/2023 2148 Gross per 24 hour  Intake 240 ml  Output 975 ml  Net -735 ml     Physical Exam  General: Pleasant, chronically ill elderly man sitting in chair. No acute distress. HEENT: [place]/AT. Anicteric sclera. CV: Regular rate.  Irregularly irregular rhythm. No murmurs, rubs, or gallops. No LE edema Pulmonary: On 2L . Lungs CTAB. Normal effort. No wheezing or rales. Abdominal: Soft, NT/ND. Normal bowel sounds. Skin: Warm and dry. No obvious rash or lesions. Neuro: A&Ox3. Speech is clear and comprehensible. Moves all extremities. BLE with 4/5 strength, mild drift on the RLE. Right foot with weak plantarflexion and dorsiflexion.    RN pressure injury documentation:      Data Review:    Recent Labs  Lab 10/28/23 1507 10/28/23 1511 10/29/23 0602 10/30/23 0554  WBC 10.3  --  9.9 9.6  HGB 12.2*  13.3 11.6* 12.9*  HCT 40.4 39.0 36.2* 40.4  PLT 289  --  284 292  MCV 95.3  --  92.3 90.0  MCH 28.8  --  29.6 28.7  MCHC 30.2  --  32.0 31.9  RDW 14.3  --  14.0 14.0  LYMPHSABS 1.7  --   --   --   MONOABS 1.2*  --   --   --   EOSABS 0.2  --   --   --   BASOSABS 0.1  --   --   --     Recent Labs  Lab 10/28/23 1507 10/28/23 1511 10/28/23 1615 10/29/23 0602 10/30/23 0554  NA  --  141 138 135 136  K  --  3.6 4.1 3.1* 4.0  CL  --  102 101 100 102  CO2  --   --  26 27 26   ANIONGAP  --   --  11 8 8   GLUCOSE  --  107* 109* 95 99  BUN  --  12 10 6* 10  CREATININE  --  0.90 0.81 0.78 0.98  AST  --   --  31  --   --   ALT  --   --  12  --   --   ALKPHOS  --   --  43  --   --   BILITOT  --   --  1.1  --   --   ALBUMIN  --   --  3.1*  --   --   INR 1.4*  --   --   --   --   MG  --   --   --  1.4* 1.9  CALCIUM   --   --  8.7* 8.3* 9.1      Recent Labs  Lab 10/28/23 1507 10/28/23 1615 10/29/23 0602 10/30/23 0554  INR 1.4*  --   --   --   MG  --   --  1.4* 1.9  CALCIUM   --  8.7* 8.3* 9.1    --------------------------------------------------------------------------------------------------------------- Lab Results  Component Value Date   CHOL 118 09/10/2023   HDL 31.40 (L) 09/10/2023   LDLCALC 69 09/10/2023   TRIG 89.0 09/10/2023   CHOLHDL 4 09/10/2023    Lab Results  Component Value Date   HGBA1C 6.4 09/10/2023   No results for input(s): "TSH", "T4TOTAL", "FREET4", "T3FREE", "THYROIDAB" in the last 72 hours. No results for input(s): "VITAMINB12", "FOLATE", "FERRITIN", "TIBC", "IRON", "RETICCTPCT" in the last 72 hours. ------------------------------------------------------------------------------------------------------------------ Cardiac Enzymes No results for input(s): "CKMB", "TROPONINI", "MYOGLOBIN" in the last 168 hours.  Invalid input(s): "CK"  Micro Results No results found for this or any previous visit (from the past 240 hours).  Radiology  Reports EEG adult Result Date: 10/29/2023 Arleene Lack, MD     10/29/2023 10:51 AM Patient Name: NORVIN OHLIN MRN: [id number]  Epilepsy Attending: Arleene Lack Referring Physician/Provider: Ronnette Coke, MD Date: 10/29/2023 Duration: 23 mins Patient history: 78yo M with aphasia. EEG to evaluate for seizure Level of alertness: Awake AEDs during EEG study: None Technical aspects: This EEG study was done with scalp electrodes positioned according to the 10-20 International system of electrode placement. Electrical activity was reviewed with band pass filter of 1-70Hz , sensitivity of 7 uV/mm, display speed of 55mm/sec with a 60Hz  notched filter applied as appropriate. EEG data were recorded continuously and digitally stored.  Video monitoring was available and reviewed as appropriate. Description: The posterior dominant rhythm consists of 8 Hz activity of moderate voltage (25-35 uV) seen predominantly in posterior head regions, symmetric and reactive to eye opening and eye closing. Hyperventilation and photic stimulation were not performed.   IMPRESSION: This study is within normal limits. No seizures or epileptiform discharges were seen throughout the recording. A normal interictal EEG does not exclude the diagnosis of epilepsy. Arleene Lack   MR BRAIN WO CONTRAST Result Date: 10/28/2023 CLINICAL DATA:  Neuro deficit, acute, stroke suspected EXAM: MRI HEAD WITHOUT CONTRAST TECHNIQUE: Multiplanar, multiecho pulse sequences of the brain and surrounding structures were obtained without intravenous contrast. COMPARISON:  CT head from today. FINDINGS: Brain: No acute infarction, hemorrhage, hydrocephalus, extra-axial collection or mass lesion. Remote right cerebellar and pontine infarcts. Patchy T2/FLAIR hyperintensities the white matter, compatible with chronic microvascular ischemic disease. Cerebral atrophy. Vascular: Major arterial flow voids are maintained. Skull and upper cervical spine: Normal  marrow signal. Sinuses/Orbits: Clear sinuses.  No acute orbital findings. IMPRESSION: 1. No evidence of acute intracranial abnormality. 2. Remote lacunar infarcts and chronic microvascular ischemic disease. Electronically Signed   By: Stevenson Elbe M.D.   On: 10/28/2023 19:08   CT ANGIO HEAD NECK W WO CM (CODE STROKE) Result Date: 10/28/2023 CLINICAL DATA:  Neuro deficit, acute, stroke suspected.  Aphasia EXAM: CT ANGIOGRAPHY HEAD AND NECK WITH AND WITHOUT CONTRAST TECHNIQUE: Multidetector CT imaging of the head and neck was performed using the standard protocol during bolus administration of intravenous contrast. Multiplanar CT image reconstructions and MIPs were obtained to evaluate the vascular anatomy. Carotid stenosis measurements (when applicable) are obtained utilizing NASCET criteria, using the distal internal carotid diameter as the denominator. RADIATION DOSE REDUCTION: This exam was performed according to the departmental dose-optimization program which includes automated exposure control, adjustment of the mA and/or kV according to patient size and/or use of iterative reconstruction technique. CONTRAST:  75mL OMNIPAQUE  IOHEXOL  350 MG/ML SOLN COMPARISON:  MR angiogram circle of Willis 04/12/2023. FINDINGS: CTA NECK FINDINGS Aortic arch: Atherosclerotic calcifications are present in the aorta and great vessel origins without focal stenosis. No aneurysm or dissection is present. Right carotid system: The right common carotid artery is within normal limits. Atherosclerotic calcifications are present at the right ICA bifurcation without significant stenosis relative to the more distal vessel. The cervical right ICA is otherwise normal. Left carotid system: The left common carotid artery is within normal limits. Bifurcation is unremarkable. The cervical left ICA is normal. Vertebral arteries: Left vertebral artery is the dominant vessel. Both vertebral arteries originate from the subclavian arteries  without significant stenosis. No significant stenosis is present in either vertebral artery in the neck. Skeleton: Endplate degenerative changes are most evident at C5-6 and C6-7. Slight degenerative anterolisthesis is present at C5-6. Alignment is otherwise anatomic. No focal osseous lesions are present. Other neck: The soft tissues of the neck are unremarkable. Salivary glands are within normal limits. Thyroid  is  normal. No significant adenopathy is present. No focal mucosal or submucosal lesions are present. Upper chest: 2 separate 4 mm nodules in the right upper lobe are noted on images 22 and 27 of series 5. Scarring is present at the right lung apex. A 7 mm nodule is present in the left upper lobe on image 24 of series 5. Review of the MIP images confirms the above findings CTA HEAD FINDINGS Anterior circulation: Atherosclerotic calcifications are present within the cavernous internal carotid arteries bilaterally. No significant stenosis is present through the ICA terminus. The A1 and M1 segments are normal. The anterior communicating artery is patent. The MCA bifurcations are normal bilaterally. The ACA and MCA branch vessels are normal bilaterally. Posterior circulation: The left vertebral artery is dominant. PICA origin is visualized and normal. Vertebrobasilar junction and basilar artery are normal. The superior cerebellar arteries are patent. Both posterior cerebral arteries originate from basilar tip. The PCA branch vessels are normal bilaterally. Venous sinuses: The dural sinuses are patent. The straight sinus and deep cerebral veins are intact. Cortical veins are within normal limits. No significant vascular malformation is evident. Anatomic variants: None Review of the MIP images confirms the above findings IMPRESSION: 1. No emergent large vessel occlusion. 2. Atherosclerotic calcifications at the right ICA bifurcation and cavernous internal carotid arteries bilaterally without significant stenosis  relative to the more distal vessels. 3. Normal variant CTA Circle of Willis without significant proximal stenosis, aneurysm, or branch vessel occlusion. 4. 7 mm nodule in the left upper lobe. Solid pulmonary nodule measuring 7 mm. Per Fleischner Society Guidelines, recommend a non-contrast Chest CT at 6-12 months. If patient is high risk for malignancy, consider an additional non-contrast Chest CT at 18-24 months. If patient is low risk for malignancy, non-contrast Chest CT at 18-24 months is optional. These guidelines do not apply to immunocompromised patients and patients with cancer. Follow up in patients with significant comorbidities as clinically warranted. For lung cancer screening, adhere to Lung-RADS guidelines. Reference: Radiology. 2017; 284(1):228-43. Electronically Signed   By: Audree Leas M.D.   On: 10/28/2023 15:38   CT HEAD CODE STROKE WO CONTRAST Result Date: 10/28/2023 CLINICAL DATA:  Code stroke. Neuro deficit, acute, stroke suspected. Aphasia. EXAM: CT HEAD WITHOUT CONTRAST TECHNIQUE: Contiguous axial images were obtained from the base of the skull through the vertex without intravenous contrast. RADIATION DOSE REDUCTION: This exam was performed according to the departmental dose-optimization program which includes automated exposure control, adjustment of the mA and/or kV according to patient size and/or use of iterative reconstruction technique. COMPARISON:  CT head without contrast 04/12/2023. FINDINGS: Brain: A remote lacunar infarct is present in the right caudate head and anterior limb of the right internal capsule. Remote ischemic changes are again noted in the thalami bilaterally. No acute infarct, hemorrhage, or mass lesion is present. Atrophy and white matter changes are moderately advanced for age. The ventricles are proportionate to the degree of atrophy. No significant extraaxial fluid collection is present. A left paramedian lacunar infarct of the pons is new since the  prior exam. Remote lacunar infarcts in the cerebellum bilaterally are stable. Vascular: Atherosclerotic calcifications are present within the cavernous internal carotid arteries bilaterally. No hyperdense vessel is present. Skull: Insert normal skull No significant extracranial soft tissue lesion is present. Sinuses/Orbits: The paranasal sinuses and mastoid air cells are clear. Bilateral lens replacements are noted. Globes and orbits are otherwise unremarkable. ASPECTS [facility] Stroke Program Early CT Score) - Ganglionic level infarction (caudate, lentiform nuclei, internal capsule,  insula, M1-M3 cortex): 7/7 - Supraganglionic infarction (M4-M6 cortex): 3/3 Total score (0-10 with 10 being normal): 10/10 IMPRESSION: 1. Left paramedian pontine lacunar infarct is new since the prior study. It is age indeterminate. MRI could be used for further evaluation if clinically indicated. 2. Aspects is 10/10. 3. Remote lacunar infarcts of the right caudate head and anterior limb of the right internal capsule. 4. Remote ischemic changes in the thalami bilaterally. 5. Remote lacunar infarcts in the cerebellum bilaterally. 6. Atrophy and white matter disease is moderately advanced for age. This likely reflects the sequela of chronic microvascular ischemia. The above was relayed via text pager to Dr. Cleone Dad on 10/28/2023 at 15:22 . Electronically Signed   By: Audree Leas M.D.   On: 10/28/2023 15:23      Signature  -   Vita Grip M.D on 10/30/2023 at 8:46 AM   -  To page go to [email or web address]

## 2023-10-30 NOTE — Evaluation (Signed)
 Physical Therapy Evaluation Patient Details Name: Matthew Hall MRN: [id number] DOB: [birth year] Today's Date: 10/30/2023  History of Present Illness  Patient is 78 y.o. male presented 10/29/23 to ED with transient difficulty speaking no resolved. MRI showed no acute process but reveals remote lacunar infarctions and chronic microvascular ischemic disease. EEG was normal. CTA head and neck is negative for large vessel occlusion but notable for left upper lobe lung nodule. PMH significant for CVA's, Rt foot drop, Afib, COPD, HTN vasculitis, diabetes, diabetic neuropathy, bladder cancer.   Clinical Impression  Matthew Hall is 78 y.o. male admitted with above HPI and diagnosis. Patient is currently limited by functional impairments below (see PT problem list). Patient lives at [facility] IL[facility] and is mod ind with rollator for mobility and ADL's at baseline. Currently pt is mobilizing at mod ind level with bed mobility and supervision level for transfer and gait demonstrating good safety awareness during transfers. Pt able to don ankle shoe lift for Rt foot drop while seated EOB without assist. Pt amb ~300' and EOS pt agreeable to remain OOB in recliner. Patient will benefit from continued skilled PT interventions to address impairments and progress independence with mobility, recommending HHPT. Acute PT will follow and progress as able.         If plan is discharge home, recommend the following: A little help with walking and/or transfers;A little help with bathing/dressing/bathroom;Assistance with cooking/housework;Assist for transportation;Help with stairs or ramp for entrance   Can travel by private vehicle        Equipment Recommendations None recommended by PT  Recommendations for Other Services       Functional Status Assessment Patient has had a recent decline in their functional status and demonstrates the ability to make significant improvements in function in a reasonable and  predictable amount of time.     Precautions / Restrictions Precautions Precautions: Fall Recall of Precautions/Restrictions: Intact Restrictions Weight Bearing Restrictions Per Provider Order: No      Mobility  Bed Mobility Overal bed mobility: Modified Independent             General bed mobility comments: use of bed features    Transfers Overall transfer level: Needs assistance Equipment used: Rolling walker (2 wheels) Transfers: Sit to/from Stand Sit to Stand: Supervision           General transfer comment: pt with good use of hands to power up and reach back to sit. pt demo's good safety awareness checking for chair behind self prior to sit.    Ambulation/Gait Ambulation/Gait assistance: Supervision Gait Distance (Feet): 300 Feet Assistive device: Rolling walker (2 wheels) Gait Pattern/deviations: Step-through pattern, Decreased stride length Gait velocity: decr     General Gait Details: CGA at start fading to Sup for safety. pt managing RW well with smooth path. No LOB noted, Rt shoe lift in place to mange foot drop with gait. VSS on 2L/min with HR in 70-90's.  Stairs            Wheelchair Mobility     Tilt Bed    Modified Rankin (Stroke Patients Only)       Balance                                             Pertinent Vitals/Pain Pain Assessment Pain Assessment: No/denies pain    Home Living Family/patient expects  to be discharged to:: Private residence (ILF since Jan 3rd 2025; [facility]) Living Arrangements: Spouse/significant other Available Help at Discharge: Family;Available PRN/intermittently Type of Home: Independent living facility Home Access: Level entry       Home Layout: One level Home Equipment: Scientist, research (medical) (4 wheels);Rolling Walker (2 wheels);Hand held shower head;Adaptive equipment;Shower seat Additional Comments: spouse is currently hospitalized on 18M, will return to SNF  when medically ready; pt just moved to [facility] in January 2025    Prior Function Prior Level of Function : Independent/Modified Independent             Mobility Comments: ambulates with rollator; at baseline assists wife who is disable but she is currently at [facility] ADLs Comments: Ind with ADLs/selfcare, does not drive. pharmacy pre-packages medications for pt.     Extremity/Trunk Assessment   Upper Extremity Assessment Upper Extremity Assessment: Overall WFL for tasks assessed    Lower Extremity Assessment Lower Extremity Assessment: Overall WFL for tasks assessed;RLE deficits/detail RLE Deficits / Details: hx of Rt foot drop    Cervical / Trunk Assessment Cervical / Trunk Assessment: Normal  Communication   Communication Communication: No apparent difficulties    Cognition Arousal: Alert Behavior During Therapy: WFL for tasks assessed/performed   PT - Cognitive impairments: No apparent impairments, No family/caregiver present to determine baseline                         Following commands: Intact       Cueing Cueing Techniques: Verbal cues     General Comments      Exercises     Assessment/Plan    PT Assessment Patient needs continued PT services  PT Problem List Decreased strength;Decreased range of motion;Decreased activity tolerance;Decreased balance;Decreased mobility;Decreased knowledge of use of DME       PT Treatment Interventions DME instruction;Gait training;Stair training;Functional mobility training;Therapeutic activities;Therapeutic exercise;Balance training;Patient/family education    PT Goals (Current goals can be found in the Care Plan section)  Acute Rehab PT Goals Patient Stated Goal: return home at mod ind level PT Goal Formulation: With patient Time For Goal Achievement: 11/13/23 Potential to Achieve Goals: Good    Frequency Min 1X/week     Co-evaluation               AM-PAC PT "6 Clicks" Mobility   Outcome Measure Help needed turning from your back to your side while in a flat bed without using bedrails?: None Help needed moving from lying on your back to sitting on the side of a flat bed without using bedrails?: None Help needed moving to and from a bed to a chair (including a wheelchair)?: A Little Help needed standing up from a chair using your arms (e.g., wheelchair or bedside chair)?: A Little Help needed to walk in hospital room?: A Little Help needed climbing 3-5 steps with a railing? : A Little 6 Click Score: 20    End of Session Equipment Utilized During Treatment: Gait belt;Oxygen  Activity Tolerance: Patient tolerated treatment well     PT Visit Diagnosis: Other abnormalities of gait and mobility (R26.89);Muscle weakness (generalized) (M62.81);Difficulty in walking, not elsewhere classified (R26.2);Other symptoms and signs involving the nervous system (R29.898)    Time: 3664-4034 PT Time Calculation (min) (ACUTE ONLY): 29 min   Charges:   PT Evaluation $PT Eval Low Complexity: 1 Low PT Treatments $Gait Training: 8-22 mins PT General Charges $$ ACUTE PT VISIT: 1 Visit  Tish Forge, DPT Acute Rehabilitation Services Office [phone]  10/30/23 12:23 PM
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
## 2023-10-30 NOTE — Evaluation (Signed)
 Speech Language Pathology Evaluation Patient Details Name: Matthew Hall MRN: [id number] DOB: [birth year] Today's Date: 10/30/2023 Time: 4098-1191 SLP Time Calculation (min) (ACUTE ONLY): 16 min  Problem List:  Patient Active Problem List   Diagnosis Date Noted   Stroke-like symptoms 10/29/2023   Hypomagnesemia 10/29/2023   Aphasia 10/28/2023   Lung nodule 10/28/2023   Chronic respiratory failure with hypoxia (HCC) 10/25/2023   Gait abnormality 09/24/2023   Prolonged QT interval 07/13/2023   DM2 (diabetes mellitus, type 2) (HCC) 07/13/2023   Paroxysmal atrial fibrillation (HCC) 07/13/2023   GAD (generalized anxiety disorder) 07/13/2023   Chronic heart failure with mildly reduced ejection fraction (HFmrEF, 41-49%) (HCC) 07/13/2023   SOB (shortness of breath) 07/13/2023   Malnutrition of moderate degree 07/13/2023   Acute respiratory failure with hypoxia (HCC) 07/12/2023   Pain due to onychomycosis of toenails of both feet 07/02/2023   Paronychia of great toe of left foot 07/02/2023   Bronchiectasis with (acute) exacerbation (HCC) 06/20/2023   Bronchiectasis with acute exacerbation (HCC) 06/19/2023   Pneumonia due to infectious organism 06/19/2023   COPD with acute exacerbation (HCC) 06/18/2023   Multifocal pneumonia 06/17/2023   Sepsis (HCC) 06/17/2023   Physical deconditioning 06/17/2023   Weakness of right foot 05/09/2023   Cerebrovascular accident (CVA) (HCC) 04/12/2023   Anxiety 04/12/2023   Diabetic peripheral neuropathy (HCC) 12/27/2020   Paresthesia 12/01/2020   Pneumonia due to COVID-19 virus 10/29/2020   Hypokalemia 10/29/2020   Healthcare maintenance 07/25/2019   Leukocytosis 08/11/2017   Elevated sed rate 05/21/2017   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) 03/01/2017   Vasculitis (HCC) 02/20/2017   Atrial fibrillation, chronic (HCC) 07/09/2016   BRONCHIECTASIS W/O ACUTE EXACERBATION 03/31/2008   Essential hypertension 09/13/2007   Allergic rhinitis 09/13/2007    EMPHYSEMA 09/13/2007   COPD exacerbation (HCC) 09/13/2007   Past Medical History:  Past Medical History:  Diagnosis Date   Anxiety    Aortic atherosclerosis (HCC)    Atrial fibrillation (HCC)    Basal cell carcinoma    Bladder cancer (HCC)    Bladder tumor    Chronic systolic heart failure (HCC)    Colon polyps    COPD (chronic obstructive pulmonary disease) (HCC)    Cutaneous vasculitis    Diabetes (HCC)    Diabetic peripheral neuropathy (HCC) 12/27/2020   Diverticulosis    Essential hypertension    GERD (gastroesophageal reflux disease)    History of pneumonia    Nephrolithiasis    Osteoarthritis    Right foot drop    Stroke [facility])    Past Surgical History:  Past Surgical History:  Procedure Laterality Date   BRONCHIAL WASHINGS  06/19/2023   Procedure: BRONCHIAL WASHINGS;  Surgeon: Joesph Mussel, DO;  Location: [facility] ENDOSCOPY;  Service: Cardiopulmonary;;   COLONOSCOPY     Kidney stones removed  1996   SKIN CANCER EXCISION Left    2003   TRANSURETHRAL RESECTION OF BLADDER TUMOR  1996   VIDEO BRONCHOSCOPY N/A 06/19/2023   Procedure: VIDEO BRONCHOSCOPY WITHOUT FLUORO;  Surgeon: Joesph Mussel, DO;  Location: [facility] ENDOSCOPY;  Service: Cardiopulmonary;  Laterality: N/A;   HPI:  Matthew Hall is a 78 y.o. 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 Symptoms have been waxing and waning in the ED and reportedly improved significantly after the patient was given Valium  in preparation for MRI. Neurology note states daughter reports confusion at home and is concerned he is no longer appropriate  for independent living. MRI brain is negative for acute abnormality but reveals remote lacunar infarctions and chronic microvascular ischemic disease.   Assessment / Plan / Recommendation Clinical Impression  Pt reading menu and getting ready to order food when SLP arrived. He states he is receiving ST at his SNF (described cognitive activities). He is oriented and stated reason for admission. Oromotor abilities are normal. Speech is intelligible and his comprehension and expressive language was within normal limits. He scored a 26/30 on the SLUMS with 27 being considered within normal limits. He missed one point for memory (recalled 4/5 words and 1/5 with semantic cue noting deficits in retrieval) and 3 points for backward digit recall. He appears functional and safe  for the acute venue, stated how he would get help if needed. His cell phone was not working and pt problem solved by having RN order lunch for him. Discussed memory and pt states he writes important information for prospective memory. Will not pick up pt for treatment in acute but recommend he continue with SLP at [facility]. He conveyed and there are notes in chart that daughter is concerned with cognition and may not be comfortable with him returning to ILF. He may need supervision with med management although he states his medicines are in prepared packs.    SLP Assessment  SLP Recommendation/Assessment: All further Speech Lanaguage Pathology  needs can be addressed in the next venue of care SLP Visit Diagnosis: Cognitive communication deficit (R41.841)    Recommendations for follow up therapy are one component of a multi-disciplinary discharge planning process, led by the attending physician.  Recommendations may be updated based on patient status, additional functional criteria and insurance authorization.    Follow Up Recommendations  Home health SLP (or SNF if going skilled)    Assistance Recommended at Discharge   Intermittent Supervision/Assistance  Functional Status Assessment Patient has not had a recent decline in their functional status  Frequency and Duration           SLP Evaluation Cognition  Overall Cognitive Status: Impaired/Different from baseline (but functional for acute setting) Arousal/Alertness: Awake/alert Orientation Level: Oriented X4 Year: 2025 Day of Week: Correct Attention: Sustained Sustained Attention: Appears intact Memory: Impaired (4/5 word recall) Memory Impairment: Retrieval deficit Awareness: Appears intact Problem Solving: Appears intact Safety/Judgment: Appears intact       Comprehension  Auditory Comprehension Overall Auditory Comprehension: Appears within functional limits for tasks assessed Visual Recognition/Discrimination Discrimination: Not tested Reading Comprehension Reading Status: Not tested (stated no difficulty reading menu)    Expression Expression Primary Mode of Expression: Verbal Verbal Expression Overall Verbal Expression: Appears within functional limits for tasks assessed Initiation: No impairment Level of Generative/Spontaneous Verbalization: Conversation Repetition:  (NT) Naming: No impairment Pragmatics: No impairment Written Expression Dominant Hand: Right Written Expression: Not tested   Oral / Motor  Oral Motor/Sensory Function Overall Oral Motor/Sensory Function: Within functional limits Motor Speech Overall Motor Speech: Appears within functional limits for tasks assessed Respiration: Within functional limits Phonation: Normal Resonance: Within functional limits Articulation: Within functional limitis Intelligibility: Intelligible Motor Planning: Witnin functional limits            Naomia Bachelor 10/30/2023, 10:56 AM

## 2023-10-30 NOTE — Plan of Care (Signed)

## 2023-10-30 NOTE — Evaluation (Signed)
 Occupational Therapy Evaluation Patient Details Name: Matthew Hall MRN: [id number] DOB: 27-Dec-1945 Today's Date: 10/30/2023   History of Present Illness   Patient is 78 y.o. male presented 10/29/23 to ED with transient difficulty speaking no resolved. MRI showed no acute process but reveals remote lacunar infarctions and chronic microvascular ischemic disease. EEG was normal. CTA head and neck is negative for large vessel occlusion but notable for left upper lobe lung nodule. PMH significant for CVA's, Rt foot drop, Afib, COPD, HTN vasculitis, diabetes, diabetic neuropathy, bladder cancer.     Clinical Impressions Pt presents with decline in function and safety with ADLs and ADL mobility with impaired strength, balance and endurance; pt with STM impairments, min verbal cues for safety during ADL mobility. PTA pt Iives at [facility] ILF/ALF and was Ind with ADLs/selfcare, does not drive, pharmacy pre-packages medications for pt, walks with rollator; at baseline assists his wife who is disable but she is currently hospitalized as well. Pt currently requires CGA - Sup with LB ADLs, toileting and mobility using RW. Pt would benefit from acute OT services to address impairments to maximize level of function and safety     If plan is discharge home, recommend the following:   A little help with bathing/dressing/bathroom;A little help with walking and/or transfers;Assist for transportation;Direct supervision/assist for medications management;Help with stairs or ramp for entrance     Functional Status Assessment   Patient has had a recent decline in their functional status and demonstrates the ability to make significant improvements in function in a reasonable and predictable amount of time.     Equipment Recommendations   None recommended by OT     Recommendations for Other Services         Precautions/Restrictions   Precautions Precautions: Fall Recall of  Precautions/Restrictions: Intact Restrictions Weight Bearing Restrictions Per Provider Order: No     Mobility Bed Mobility               General bed mobility comments: pt in bed upon arrival, assisted pt back to bed with Sup at end of session    Transfers Overall transfer level: Needs assistance Equipment used: Rolling walker (2 wheels) Transfers: Sit to/from Stand Sit to Stand: Supervision                  Balance Overall balance assessment: Mild deficits observed, not formally tested                                         ADL either performed or assessed with clinical judgement   ADL Overall ADL's : Needs assistance/impaired Eating/Feeding: Independent;Sitting   Grooming: Wash/dry hands;Wash/dry face;Supervision/safety;Standing   Upper Body Bathing: Supervision/ safety   Lower Body Bathing: Contact guard assist;Sit to/from stand   Upper Body Dressing : Supervision/safety   Lower Body Dressing: Contact guard assist;Sit to/from stand   Toilet Transfer: Contact guard assist;Supervision/safety;Ambulation;Rolling walker (2 wheels);Regular Toilet;Grab bars;Cueing for safety   Toileting- Clothing Manipulation and Hygiene: Contact guard assist;Sit to/from stand   Tub/ Shower Transfer: Contact guard assist;Ambulation;Rolling walker (2 wheels);Cueing for safety;Grab bars   Functional mobility during ADLs: Contact guard assist;Supervision/safety;Cueing for safety;Rolling walker (2 wheels)       Vision Baseline Vision/History: 1 Wears glasses Ability to See in Adequate Light: 0 Adequate Patient Visual Report: No change from baseline       Perception  Praxis         Pertinent Vitals/Pain Pain Assessment Pain Assessment: No/denies pain     Extremity/Trunk Assessment Upper Extremity Assessment Upper Extremity Assessment: Overall WFL for tasks assessed   Lower Extremity Assessment Lower Extremity Assessment: Defer to PT  evaluation RLE Deficits / Details: hx of Rt foot drop   Cervical / Trunk Assessment Cervical / Trunk Assessment: Normal   Communication Communication Communication: No apparent difficulties   Cognition Arousal: Alert Behavior During Therapy: WFL for tasks assessed/performed Cognition: No family/caregiver present to determine baseline, Cognition impaired   Orientation impairments: Situation   Memory impairment (select all impairments): Short-term memory                       Following commands: Intact       Cueing  General Comments   Cueing Techniques: Verbal cues      Exercises     Shoulder Instructions      Home Living Family/patient expects to be discharged to:: Private residence Living Arrangements: Spouse/significant other Available Help at Discharge: Family;Available PRN/intermittently Type of Home: Independent living facility Home Access: Level entry     Home Layout: One level     Bathroom Shower/Tub: Producer, television/film/video: Handicapped height Bathroom Accessibility: Yes   Home Equipment: Scientist, research (medical) (4 wheels);Rolling Walker (2 wheels);Hand held shower head;Adaptive equipment;Shower Engineering geologist: Reacher Additional Comments: spouse is currently hospitalized on 17M, will return to SNF when medically ready; pt just moved to [facility] in January 2025  Lives With: Spouse    Prior Functioning/Environment Prior Level of Function : Independent/Modified Independent             Mobility Comments: ambulates with rollator; at baseline assists wife who is disable but she is currently at SNF ADLs Comments: Ind with ADLs/selfcare, does not drive. pharmacy pre-packages medications for pt    OT Problem List: Decreased strength;Decreased activity tolerance;Decreased knowledge of use of DME or AE;Decreased safety awareness;Decreased cognition   OT Treatment/Interventions: Self-care/ADL training;DME and/or AE  instruction;Therapeutic activities;Balance training;Patient/family education      OT Goals(Current goals can be found in the care plan section)   Acute Rehab OT Goals Patient Stated Goal: go see my wife OT Goal Formulation: With patient Time For Goal Achievement: 11/13/23 Potential to Achieve Goals: Good ADL Goals Pt Will Perform Grooming: with set-up;standing Pt Will Perform Upper Body Bathing: with set-up;with modified independence Pt Will Perform Lower Body Bathing: with supervision;sit to/from stand Pt Will Perform Upper Body Dressing: with set-up;with modified independence Pt Will Perform Lower Body Dressing: with supervision;sit to/from stand Pt Will Transfer to Toilet: with modified independence;ambulating Pt Will Perform Toileting - Clothing Manipulation and hygiene: with supervision;sit to/from stand Pt Will Perform Tub/Shower Transfer: with supervision;ambulating;rolling walker;shower seat;grab bars   OT Frequency:  Min 2X/week    Co-evaluation              AM-PAC OT "6 Clicks" Daily Activity     Outcome Measure Help from another person eating meals?: None Help from another person taking care of personal grooming?: A Little Help from another person toileting, which includes using toliet, bedpan, or urinal?: A Little Help from another person bathing (including washing, rinsing, drying)?: A Little Help from another person to put on and taking off regular upper body clothing?: A Little Help from another person to put on and taking off regular lower body clothing?: A Little 6 Click Score: 19   End  of Session Equipment Utilized During Treatment: Gait belt;Rolling walker (2 wheels) Nurse Communication: Mobility status  Activity Tolerance: Patient tolerated treatment well Patient left: in bed;with call bell/phone within reach;with bed alarm set  OT Visit Diagnosis: Other abnormalities of gait and mobility (R26.89);Muscle weakness (generalized) (M62.81)                 Time: 1610-9604 OT Time Calculation (min): 24 min Charges:  OT General Charges $OT Visit: 1 Visit OT Evaluation $OT Eval Low Complexity: 1 Low OT Treatments $Therapeutic Activity: 8-22 mins    Alfred Ann 10/30/2023, 2:15 PM

## 2023-10-31 ENCOUNTER — Other Ambulatory Visit (HOSPITAL_COMMUNITY): Payer: Self-pay

## 2023-10-31 ENCOUNTER — Telehealth: Payer: Self-pay | Admitting: *Deleted

## 2023-10-31 DIAGNOSIS — I5022 Chronic systolic (congestive) heart failure: Secondary | ICD-10-CM | POA: Diagnosis not present

## 2023-10-31 DIAGNOSIS — R4701 Aphasia: Secondary | ICD-10-CM | POA: Diagnosis not present

## 2023-10-31 DIAGNOSIS — R2990 Unspecified symptoms and signs involving the nervous system: Secondary | ICD-10-CM | POA: Diagnosis not present

## 2023-10-31 DIAGNOSIS — J449 Chronic obstructive pulmonary disease, unspecified: Secondary | ICD-10-CM | POA: Diagnosis not present

## 2023-10-31 LAB — CBC
HCT: 41.2 % (ref 39.0–52.0)
Hemoglobin: 12.8 g/dL — ABNORMAL LOW (ref 13.0–17.0)
MCH: 28.2 pg (ref 26.0–34.0)
MCHC: 31.1 g/dL (ref 30.0–36.0)
MCV: 90.7 fL (ref 80.0–100.0)
Platelets: 314 10*3/uL (ref 150–400)
RBC: 4.54 MIL/uL (ref 4.22–5.81)
RDW: 14.1 % (ref 11.5–15.5)
WBC: 10 10*3/uL (ref 4.0–10.5)
nRBC: 0 % (ref 0.0–0.2)

## 2023-10-31 LAB — BASIC METABOLIC PANEL WITH GFR
Anion gap: 10 (ref 5–15)
BUN: 12 mg/dL (ref 8–23)
CO2: 29 mmol/L (ref 22–32)
Calcium: 9.3 mg/dL (ref 8.9–10.3)
Chloride: 97 mmol/L — ABNORMAL LOW (ref 98–111)
Creatinine, Ser: 0.85 mg/dL (ref 0.61–1.24)
GFR, Estimated: >60 mL/min (ref >60)
Glucose, Bld: 99 mg/dL (ref 70–99)
Potassium: 3.8 mmol/L (ref 3.5–5.1)
Sodium: 136 mmol/L (ref 135–145)

## 2023-10-31 LAB — GLUCOSE, CAPILLARY: Glucose-Capillary: 94 mg/dL (ref 70–99)

## 2023-10-31 MED ORDER — ALPRAZOLAM 0.25 MG PO TABS
0.2500 mg | ORAL_TABLET | Freq: Two times a day (BID) | ORAL | 0 refills | Status: DC | PRN
  Filled 2023-10-31: qty 90, 45d supply, fill #0

## 2023-10-31 NOTE — Assessment & Plan Note (Signed)
 10-31-2023 stable. Chronic.

## 2023-10-31 NOTE — Plan of Care (Signed)

## 2023-10-31 NOTE — Assessment & Plan Note (Signed)
 10-31-2023 pt seen by neurology. MRI brain was negative for CVA. EEG negative for seizure. Neurology did not think pt has seizures and did not recommend AEDs. Unclear the exact etiology of his aphasia but pt is now conversational now and without aphasia.

## 2023-10-31 NOTE — Hospital Course (Signed)
 HPI: Matthew Hall is a 78 y.o. 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   Patient was in his usual state when speaking with his daughter at 1:32 PM today.  She spoke with him again 30 minutes later and he was confused, repeated that he was unable to understand what she was saying, and was speaking unintelligibly.    Symptoms have been waxing and waning in the ED and reportedly improved significantly after the patient was given Valium  in preparation for MRI.   ED Course: Upon arrival to the ED, patient is found to be afebrile and saturating well on 2 L/min of supplemental oxygen  with elevated blood pressure.  EKG demonstrates sinus rhythm with PAC.  CTA head and neck is negative for large vessel occlusion but notable for left upper lobe lung nodule.  MRI brain is negative for acute abnormality but reveals remote lacunar infarctions and chronic microvascular ischemic disease.   Neurology was consulted by the ED physician and has recommended observation in the hospital for EEG.  Significant Events: Admitted 10/28/2023 for aphasia   Significant Labs: WBC 10.3, HgB 12.2, plt 289 UDS positive for benzos Na 138, K 4.1, CO2 of 26, BUN 10, Scr 0.81, glu 109  Significant Imaging Studies: CT head  Left paramedian pontine lacunar infarct is new since the prior study. It is age indeterminate. MRI could be used for further evaluation if clinically indicated. 2. Aspects is 10/10. 3. Remote lacunar infarcts of the right caudate head and anterior limb of the right internal capsule. 4. Remote ischemic changes in the thalami bilaterally.  5. Remote lacunar infarcts in the cerebellum bilaterally. 6. Atrophy and  white matter disease is moderately advanced for age. This likely reflects the sequela of chronic microvascular ischemia. CTA head No emergent large vessel occlusion. 2. Atherosclerotic calcifications at the right ICA bifurcation and cavernous internal carotid arteries bilaterally without significant stenosis relative to the more distal vessels. 3. Normal variant CTA Circle of Willis without significant proximal stenosis, aneurysm, or branch vessel occlusion. 4. 7 mm nodule in the left upper lobe. Solid pulmonary nodule measuring 7 mm MRI brain No evidence of acute intracranial abnormality. 2. Remote lacunar infarcts and chronic microvascular ischemic disease.  Antibiotic Therapy: Anti-infectives (From admission, onward)    None       Procedures: 10-29-2023 EEG This study is within normal limits. No seizures or epileptiform discharges were seen throughout the recording.   Consultants: Neurology

## 2023-10-31 NOTE — Assessment & Plan Note (Signed)
 Resolved

## 2023-10-31 NOTE — Telephone Encounter (Signed)
 Pts daughter notified of plan, she is agreeable to this.NFN

## 2023-10-31 NOTE — Progress Notes (Signed)
 PROGRESS NOTE    KELDAN EPLIN  ZHY:[id number] DOB: 28-May-1946 DOA: 10/28/2023 PCP: Mandy Second, PA  Subjective: Pt seen and examined. Stable. Ready for DC back to [facility] independent living.   Hospital Course: HPI: Matthew Hall is a 78 y.o. 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   Patient was in his usual state when speaking with his daughter at 1:32 PM today.  She spoke with him again 30 minutes later and he was confused, repeated that he was unable to understand what she was saying, and was speaking unintelligibly.    Symptoms have been waxing and waning in the ED and reportedly improved significantly after the patient was given Valium  in preparation for MRI.   ED Course: Upon arrival to the ED, patient is found to be afebrile and saturating well on 2 L/min of supplemental oxygen  with elevated blood pressure.  EKG demonstrates sinus rhythm with PAC.  CTA head and neck is negative for large vessel occlusion but notable for left upper lobe lung nodule.  MRI brain is negative for acute abnormality but reveals remote lacunar infarctions and chronic microvascular ischemic disease.   Neurology was consulted by the ED physician and has recommended observation in the hospital for EEG.  Significant Events: Admitted 10/28/2023 for aphasia   Significant Labs: WBC 10.3, HgB 12.2, plt 289 UDS positive for benzos Na 138, K 4.1, CO2 of 26, BUN 10, Scr 0.81, glu 109  Significant Imaging Studies: CT head  Left paramedian pontine lacunar infarct is new since the prior study. It is age indeterminate. MRI could be used for further evaluation if clinically indicated. 2. Aspects is  10/10. 3. Remote lacunar infarcts of the right caudate head and anterior limb of the right internal capsule. 4. Remote ischemic changes in the thalami bilaterally.  5. Remote lacunar infarcts in the cerebellum bilaterally. 6. Atrophy and white matter disease is moderately advanced for age. This likely reflects the sequela of chronic microvascular ischemia. CTA head No emergent large vessel occlusion. 2. Atherosclerotic calcifications at the right ICA bifurcation and cavernous internal carotid arteries bilaterally without significant stenosis relative to the more distal vessels. 3. Normal variant CTA Circle of Willis without significant proximal stenosis, aneurysm, or branch vessel occlusion. 4. 7 mm nodule in the left upper lobe. Solid pulmonary nodule measuring 7 mm MRI brain No evidence of acute intracranial abnormality. 2. Remote lacunar infarcts and chronic microvascular ischemic disease.  Antibiotic Therapy: Anti-infectives (From admission, onward)    None       Procedures: 10-29-2023 EEG This study is within normal limits. No seizures or epileptiform discharges were seen throughout the recording.   Consultants: Neurology     Assessment and Plan: * Aphasia 10-31-2023 pt seen by neurology. MRI brain was negative for CVA. EEG negative for seizure. Neurology did not think pt has seizures and did not recommend AEDs. Unclear the exact etiology of his aphasia but pt is now conversational now and without aphasia.  Stroke-like symptoms-resolved as of 10/31/2023 10-31-2023 MRI brain negative for CVA. Neurology signed off case.  Lung nodule 10-31-2023 seen on his CTA head/neck.   Per Fleischner Society Guidelines, recommend a non-contrast Chest CT at 6-12 months. If patient is high risk for malignancy, consider an additional non-contrast Chest  CT at 18-24 months. If patient is low risk for malignancy, non-contrast Chest CT at 18-24 months is optional.  Chronic respiratory failure with hypoxia  (HCC) 10-31-2023 stable. Chronic. Currently on RA.  Chronic heart failure with mildly reduced ejection fraction (HFmrEF, 41-49%) (HCC) 10-31-2023 stable. Chronic.   GAD (generalized anxiety disorder) 10-31-2023 stable. Chronic. Continue prn xanax   Paroxysmal atrial fibrillation (HCC) 10-31-2023 stable. Chronic.  On toprol -XL and eliquis .  DM2 (diabetes mellitus, type 2) (HCC) 10-31-2023 stable. Chronic.  On meformin.   Lab Results  Component Value Date   HGBA1C 6.4 09/10/2023   HGBA1C 6.9 (H) 05/09/2023   HGBA1C 6.9 (H) 04/12/2023     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) 10-31-2023 stable. Chronic.   BRONCHIECTASIS W/O ACUTE EXACERBATION 10-31-2023 stable. Chronic.   Hypomagnesemia-resolved as of 10/31/2023 Resolved.   DVT prophylaxis:  apixaban  (ELIQUIS ) tablet 5 mg     Code Status: Full Code Family Communication: discussed with pt's dtr Odilia Bennett via phone. Disposition Plan: return to independent living Reason for continuing need for hospitalization: stable for DC.  Objective: Vitals:   10/30/23 2055 10/31/23 0026 10/31/23 0403 10/31/23 0747  BP:  122/71 108/74 (!) 144/80  Pulse:  67 61 61  Resp:  18 18   Temp:  98.2 F (36.8 C) 97.6 F (36.4 C) 98.2 F (36.8 C)  TempSrc:  Oral  Oral  SpO2: 92% 100% 100% 98%  Weight:      Height:        Intake/Output Summary (Last 24 hours) at 10/31/2023 0924 Last data filed at 10/31/2023 0530 Gross per 24 hour  Intake 240 ml  Output 1300 ml  Net -1060 ml   Filed Weights   10/28/23 1530  Weight: 65.6 kg    Examination:  Physical Exam Vitals and nursing note reviewed.  Constitutional:      General: He is not in acute distress.    Appearance: He is not toxic-appearing or diaphoretic.  HENT:     Head: Normocephalic and atraumatic.     Nose: Nose normal.  Eyes:     General: No scleral icterus. Cardiovascular:     Rate and Rhythm: Normal rate. Rhythm irregular.  Pulmonary:     Effort: Pulmonary effort is normal.  No respiratory distress.     Breath sounds: Normal breath sounds. No wheezing or rales.  Abdominal:     General: Bowel sounds are normal.     Palpations: Abdomen is soft.  Musculoskeletal:     Right lower leg: No edema.     Left lower leg: No edema.  Skin:    General: Skin is warm and dry.     Capillary Refill: Capillary refill takes less than 2 seconds.  Neurological:     General: No focal deficit present.     Mental Status: He is alert and oriented to person, place, and time.     Comments: Conversant. No aphasia or dysarthria     Data Reviewed: I have personally reviewed following labs and imaging studies  CBC: Recent Labs  Lab 10/28/23 1507 10/28/23 1511 10/29/23 0602 10/30/23 0554 10/31/23 0609  WBC 10.3  --  9.9 9.6 10.0  NEUTROABS 7.1  --   --   --   --   HGB 12.2* 13.3 11.6* 12.9* 12.8*  HCT 40.4 39.0 36.2* 40.4 41.2  MCV 95.3  --  92.3 90.0 90.7  PLT 289  --  284 292 314   Basic Metabolic Panel: Recent Labs  Lab 10/28/23 1511 10/28/23 1615 10/29/23  0602 10/30/23 0554 10/31/23 0609  NA 141 138 135 136 136  K 3.6 4.1 3.1* 4.0 3.8  CL 102 101 100 102 97*  CO2  --  26 27 26 29   GLUCOSE 107* 109* 95 99 99  BUN 12 10 6* 10 12  CREATININE 0.90 0.81 0.78 0.98 0.85  CALCIUM   --  8.7* 8.3* 9.1 9.3  MG  --   --  1.4* 1.9  --    GFR: Estimated Creatinine Clearance: 65.4 mL/min (by C-G formula based on SCr of 0.85 mg/dL). Liver Function Tests: Recent Labs  Lab 10/28/23 1615  AST 31  ALT 12  ALKPHOS 43  BILITOT 1.1  PROT 6.7  ALBUMIN 3.1*   Coagulation Profile: Recent Labs  Lab 10/28/23 1507  INR 1.4*   BNP (last 3 results) Recent Labs    07/13/23 0533 08/11/23 0120 08/12/23 1138  BNP 117.8* 333.0* 222.1*   CBG: Recent Labs  Lab 10/30/23 0645 10/30/23 1215 10/30/23 1600 10/30/23 2130 10/31/23 0621  GLUCAP 106* 150* 105* 196* 94   Radiology Studies: EEG adult Result Date: 10/29/2023 Arleene Lack, MD     10/29/2023 10:51 AM  Patient Name: Matthew Hall MRN: [id number] Epilepsy Attending: Arleene Lack Referring Physician/Provider: Ronnette Coke, MD Date: 10/29/2023 Duration: 23 mins Patient history: 78yo M with aphasia. EEG to evaluate for seizure Level of alertness: Awake AEDs during EEG study: None Technical aspects: This EEG study was done with scalp electrodes positioned according to the 10-20 International system of electrode placement. Electrical activity was reviewed with band pass filter of 1-70Hz , sensitivity of 7 uV/mm, display speed of 32mm/sec with a 60Hz  notched filter applied as appropriate. EEG data were recorded continuously and digitally stored.  Video monitoring was available and reviewed as appropriate. Description: The posterior dominant rhythm consists of 8 Hz activity of moderate voltage (25-35 uV) seen predominantly in posterior head regions, symmetric and reactive to eye opening and eye closing. Hyperventilation and photic stimulation were not performed.   IMPRESSION: This study is within normal limits. No seizures or epileptiform discharges were seen throughout the recording. A normal interictal EEG does not exclude the diagnosis of epilepsy. Priyanka O Yadav    Scheduled Meds:  apixaban   5 mg Oral BID   budeson-glycopyrrolate -formoterol   2 puff Inhalation BID   guaiFENesin   600 mg Oral BID   hydrochlorothiazide   12.5 mg Oral Daily   insulin  aspart  0-9 Units Subcutaneous TID WC   metoprolol  succinate  25 mg Oral BID   pantoprazole   40 mg Oral Daily   sodium chloride  flush  3 mL Intravenous Q12H   Continuous Infusions:   LOS: 0 days   Time spent: 50 minutes  Unk Garb, DO  [facility]  10/31/2023, 9:24 AM

## 2023-10-31 NOTE — Discharge Summary (Signed)
 [facility] Physician Discharge Summary   Patient name: Matthew Hall  Admit date:     10/28/2023  Discharge date: 10/31/2023  Attending Physician: Walton Guppy [[id number]]  Discharge Physician: Unk Garb   PCP: Mandy Second, PA  Admitted From: [facility] Disposition:   [facility]  Recommendations for Outpatient Follow-up:  Follow up with PCP in 1-2 weeks Please follow up on the following pending results:  pt with upper lobe lung nodule. Defer to PCP to decide if pt needs further imaging vs referral to outpatient pulmonology.  Home Health:Yes. Home health PT, OT, ST Equipment/Devices: None    Discharge Condition:Stable CODE STATUS:FULL Diet recommendation: Diabetic Fluid Restriction: None  Hospital Summary: HPI: Matthew Hall is a 78 y.o. 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   Patient was in his usual state when speaking with his daughter at 1:32 PM today.  She spoke with him again 30 minutes later and he was confused, repeated that he was unable to understand what she was saying, and was speaking unintelligibly.    Symptoms have been waxing and waning in the ED and reportedly improved significantly after the patient was given Valium  in preparation for MRI.   ED Course: Upon arrival to the ED, patient is found to be afebrile and saturating well on 2 L/min of supplemental oxygen  with elevated blood pressure.  EKG demonstrates sinus rhythm with PAC.  CTA head and neck is negative for large vessel occlusion but notable for left upper lobe lung nodule.  MRI brain is negative for acute abnormality but reveals remote lacunar infarctions and chronic  microvascular ischemic disease.   Neurology was consulted by the ED physician and has recommended observation in the hospital for EEG.  Significant Events: Admitted 10/28/2023 for aphasia   Significant Labs: WBC 10.3, HgB 12.2, plt 289 UDS positive for benzos Na 138, K 4.1, CO2 of 26, BUN 10, Scr 0.81, glu 109  Significant Imaging Studies: CT head  Left paramedian pontine lacunar infarct is new since the prior study. It is age indeterminate. MRI could be used for further evaluation if clinically indicated. 2. Aspects is 10/10. 3. Remote lacunar infarcts of the right caudate head and anterior limb of the right internal capsule. 4. Remote ischemic changes in the thalami bilaterally.  5. Remote lacunar infarcts in the cerebellum bilaterally. 6. Atrophy and white matter disease is moderately advanced for age. This likely reflects the sequela of chronic microvascular ischemia. CTA head No emergent large vessel occlusion. 2. Atherosclerotic calcifications at the right ICA bifurcation and cavernous internal carotid arteries bilaterally without significant stenosis relative to the more distal vessels. 3. Normal variant CTA Circle of Willis without significant proximal stenosis, aneurysm, or branch vessel occlusion. 4. 7 mm nodule in the left upper lobe. Solid pulmonary nodule measuring 7 mm MRI brain No evidence of acute intracranial abnormality. 2. Remote lacunar infarcts and chronic microvascular ischemic disease.  Antibiotic Therapy: Anti-infectives (From admission, onward)    None       Procedures: 10-29-2023 EEG This study is within normal limits. No seizures or epileptiform discharges were seen throughout the recording.   Consultants: Neurology    Hospital Course by Problem: * Aphasia 10-31-2023 pt seen by neurology. MRI brain was negative  for CVA. EEG negative for seizure. Neurology did not think pt has seizures and did not recommend AEDs. Unclear the exact etiology of his aphasia but  pt is now conversational now and without aphasia.  Stroke-like symptoms-resolved as of 10/31/2023 10-31-2023 MRI brain negative for CVA. Neurology signed off case.  Lung nodule 10-31-2023 seen on his CTA head/neck.   Per Fleischner Society Guidelines, recommend a non-contrast Chest CT at 6-12 months. If patient is high risk for malignancy, consider an additional non-contrast Chest CT at 18-24 months. If patient is low risk for malignancy, non-contrast Chest CT at 18-24 months is optional.  Chronic respiratory failure with hypoxia (HCC) 10-31-2023 stable. Chronic. Currently on RA.  Chronic heart failure with mildly reduced ejection fraction (HFmrEF, 41-49%) (HCC) 10-31-2023 stable. Chronic.   GAD (generalized anxiety disorder) 10-31-2023 stable. Chronic. Continue prn xanax   Paroxysmal atrial fibrillation (HCC) 10-31-2023 stable. Chronic.  On toprol -XL and eliquis .  DM2 (diabetes mellitus, type 2) (HCC) 10-31-2023 stable. Chronic.  On meformin.   Lab Results  Component Value Date   HGBA1C 6.4 09/10/2023   HGBA1C 6.9 (H) 05/09/2023   HGBA1C 6.9 (H) 04/12/2023     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) 10-31-2023 stable. Chronic.   BRONCHIECTASIS W/O ACUTE EXACERBATION 10-31-2023 stable. Chronic.   Hypomagnesemia-resolved as of 10/31/2023 Resolved.    Discharge Diagnoses:  Principal Problem:   Aphasia Active Problems:   BRONCHIECTASIS W/O ACUTE EXACERBATION   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)   DM2 (diabetes mellitus, type 2) (HCC)   Paroxysmal atrial fibrillation (HCC)   GAD (generalized anxiety disorder)   Chronic heart failure with mildly reduced ejection fraction (HFmrEF, 41-49%) (HCC)   Chronic respiratory failure with hypoxia (HCC)   Lung nodule   Discharge Instructions  Discharge Instructions     Call MD for:  difficulty breathing, headache or visual disturbances   Complete by: As directed    Call MD for:  extreme fatigue   Complete by: As directed    Call MD  for:  hives   Complete by: As directed    Call MD for:  persistant dizziness or light-headedness   Complete by: As directed    Call MD for:  persistant nausea and vomiting   Complete by: As directed    Call MD for:  severe uncontrolled pain   Complete by: As directed    Call MD for:  temperature >100.4   Complete by: As directed    Diet - low sodium heart healthy   Complete by: As directed    Diet Carb Modified   Complete by: As directed    Discharge instructions   Complete by: As directed    1. Follow up with your primary care provider in 1-2 weeks following discharge from hospital.   Increase activity slowly   Complete by: As directed       Allergies as of 10/31/2023       Reactions   Simvastatin Rash   Cortisone Nausea And Vomiting   Albuterol  Palpitations, Other (See Comments)   Pt is okay to take xopenex , albuterol  raises his heart rate   Augmentin [amoxicillin-pot Clavulanate] Rash   Ciprofloxacin Rash   Contrast Media [iodinated Contrast Media] Rash   Flagyl  [metronidazole ] Rash   Latex Hives, Rash   Sulfa  Antibiotics Rash        Medication List     TAKE these medications    ALPRAZolam  0.25 MG tablet Commonly known as: XANAX  Take 1 tablet (0.25 mg total) by mouth 2 (two) times  daily as needed for anxiety or sleep.   Breztri  Aerosphere 160-9-4.8 MCG/ACT Aero inhaler Generic drug: budeson-glycopyrrolate -formoterol  Inhale 2 puffs into the lungs 2 (two) times daily.   Eliquis  5 MG Tabs tablet Generic drug: apixaban  Take 1 tablet (5 mg total) by mouth 2 (two) times daily.   feeding supplement Liqd Take 237 mLs by mouth 2 (two) times daily between meals.   FT Mucus Relief 12HR 600 MG 12 hr tablet Generic drug: guaiFENesin  Take 1 tablet (600 mg total) by mouth 2 (two) times daily.   hydrochlorothiazide  12.5 MG tablet Commonly known as: HYDRODIURIL  Take 1 tablet (12.5 mg total) by mouth as needed.   levalbuterol  0.63 MG/3ML nebulizer solution Commonly  known as: Xopenex  Take 3 mLs (0.63 mg total) by nebulization every 6 (six) hours as needed for wheezing or shortness of breath.   levalbuterol  45 MCG/ACT inhaler Commonly known as: XOPENEX  HFA Inhale 2 puffs into the lungs every 6 (six) hours as needed for wheezing.   metFORMIN  500 MG 24 hr tablet Commonly known as: GLUCOPHAGE -XR Take 1 tablet (500 mg total) by mouth daily.   metoprolol  succinate 25 MG 24 hr tablet Commonly known as: TOPROL -XL Take 1 tablet (25 mg total) by mouth 2 (two) times daily.   omeprazole  20 MG capsule Commonly known as: PRILOSEC Take 1 capsule (20 mg total) by mouth daily.   triamcinolone  55 MCG/ACT Aero nasal inhaler Commonly known as: NASACORT  Place 1 spray into the nose daily.   Tylenol  325 MG Caps Generic drug: Acetaminophen  Take 1-2 capsules by mouth every 6 (six) hours as needed (pain).   VITAMIN B12 PO Take 1 tablet by mouth daily.        Allergies  Allergen Reactions   Simvastatin Rash   Cortisone Nausea And Vomiting   Albuterol  Palpitations and Other (See Comments)    Pt is okay to take xopenex , albuterol  raises his heart rate   Augmentin [Amoxicillin-Pot Clavulanate] Rash   Ciprofloxacin Rash   Contrast Media [Iodinated Contrast Media] Rash   Flagyl  [Metronidazole ] Rash   Latex Hives and Rash   Sulfa  Antibiotics Rash    Discharge Exam: Vitals:   10/31/23 0403 10/31/23 0747  BP: 108/74 (!) 144/80  Pulse: 61 61  Resp: 18   Temp: 97.6 F (36.4 C) 98.2 F (36.8 C)  SpO2: 100% 98%    Physical Exam Vitals and nursing note reviewed.  Constitutional:      General: He is not in acute distress.    Appearance: He is not toxic-appearing or diaphoretic.  HENT:     Head: Normocephalic and atraumatic.     Nose: Nose normal.  Eyes:     General: No scleral icterus. Cardiovascular:     Rate and Rhythm: Normal rate. Rhythm irregular.  Pulmonary:     Effort: Pulmonary effort is normal. No respiratory distress.     Breath  sounds: Normal breath sounds. No wheezing or rales.  Abdominal:     General: Bowel sounds are normal.     Palpations: Abdomen is soft.  Musculoskeletal:     Right lower leg: No edema.     Left lower leg: No edema.  Skin:    General: Skin is warm and dry.     Capillary Refill: Capillary refill takes less than 2 seconds.  Neurological:     General: No focal deficit present.     Mental Status: He is alert and oriented to person, place, and time.     Comments: Conversant. No aphasia  or dysarthria     The results of significant diagnostics from this hospitalization (including imaging, microbiology, ancillary and laboratory) are listed below for reference.     Labs:  Basic Metabolic Panel: Recent Labs  Lab 10/28/23 1511 10/28/23 1615 10/29/23 0602 10/30/23 0554 10/31/23 0609  NA 141 138 135 136 136  K 3.6 4.1 3.1* 4.0 3.8  CL 102 101 100 102 97*  CO2  --  26 27 26 29   GLUCOSE 107* 109* 95 99 99  BUN 12 10 6* 10 12  CREATININE 0.90 0.81 0.78 0.98 0.85  CALCIUM   --  8.7* 8.3* 9.1 9.3  MG  --   --  1.4* 1.9  --    Liver Function Tests: Recent Labs  Lab 10/28/23 1615  AST 31  ALT 12  ALKPHOS 43  BILITOT 1.1  PROT 6.7  ALBUMIN 3.1*   CBC: Recent Labs  Lab 10/28/23 1507 10/28/23 1511 10/29/23 0602 10/30/23 0554 10/31/23 0609  WBC 10.3  --  9.9 9.6 10.0  NEUTROABS 7.1  --   --   --   --   HGB 12.2* 13.3 11.6* 12.9* 12.8*  HCT 40.4 39.0 36.2* 40.4 41.2  MCV 95.3  --  92.3 90.0 90.7  PLT 289  --  284 292 314   CBG: Recent Labs  Lab 10/30/23 0645 10/30/23 1215 10/30/23 1600 10/30/23 2130 10/31/23 0621  GLUCAP 106* 150* 105* 196* 94   Lab Results  Component Value Date   HGBA1C 6.4 09/10/2023   HGBA1C 6.9 (H) 05/09/2023   HGBA1C 6.9 (H) 04/12/2023   Urinalysis    Component Value Date/Time   COLORURINE YELLOW 10/28/2023 1550   APPEARANCEUR CLEAR 10/28/2023 1550   LABSPEC >1.046 (H) 10/28/2023 1550   PHURINE 6.0 10/28/2023 1550   GLUCOSEU NEGATIVE  10/28/2023 1550   HGBUR NEGATIVE 10/28/2023 1550   BILIRUBINUR NEGATIVE 10/28/2023 1550   KETONESUR NEGATIVE 10/28/2023 1550   PROTEINUR NEGATIVE 10/28/2023 1550   NITRITE NEGATIVE 10/28/2023 1550   LEUKOCYTESUR NEGATIVE 10/28/2023 1550   Sepsis Labs Recent Labs  Lab 10/28/23 1507 10/29/23 0602 10/30/23 0554 10/31/23 0609  WBC 10.3 9.9 9.6 10.0    Procedures/Studies: EEG adult Result Date: 10/29/2023 Arleene Lack, MD     10/29/2023 10:51 AM Patient Name: JANSSEN ZEE MRN: [id number] Epilepsy Attending: Arleene Lack Referring Physician/Provider: Ronnette Coke, MD Date: 10/29/2023 Duration: 23 mins Patient history: 78yo M with aphasia. EEG to evaluate for seizure Level of alertness: Awake AEDs during EEG study: None Technical aspects: This EEG study was done with scalp electrodes positioned according to the 10-20 International system of electrode placement. Electrical activity was reviewed with band pass filter of 1-70Hz , sensitivity of 7 uV/mm, display speed of 47mm/sec with a 60Hz  notched filter applied as appropriate. EEG data were recorded continuously and digitally stored.  Video monitoring was available and reviewed as appropriate. Description: The posterior dominant rhythm consists of 8 Hz activity of moderate voltage (25-35 uV) seen predominantly in posterior head regions, symmetric and reactive to eye opening and eye closing. Hyperventilation and photic stimulation were not performed.   IMPRESSION: This study is within normal limits. No seizures or epileptiform discharges were seen throughout the recording. A normal interictal EEG does not exclude the diagnosis of epilepsy. Arleene Lack   MR BRAIN WO CONTRAST Result Date: 10/28/2023 CLINICAL DATA:  Neuro deficit, acute, stroke suspected EXAM: MRI HEAD WITHOUT CONTRAST TECHNIQUE: Multiplanar, multiecho pulse sequences of the brain and surrounding structures  were obtained without intravenous contrast. COMPARISON:  CT  head from today. FINDINGS: Brain: No acute infarction, hemorrhage, hydrocephalus, extra-axial collection or mass lesion. Remote right cerebellar and pontine infarcts. Patchy T2/FLAIR hyperintensities the white matter, compatible with chronic microvascular ischemic disease. Cerebral atrophy. Vascular: Major arterial flow voids are maintained. Skull and upper cervical spine: Normal marrow signal. Sinuses/Orbits: Clear sinuses.  No acute orbital findings. IMPRESSION: 1. No evidence of acute intracranial abnormality. 2. Remote lacunar infarcts and chronic microvascular ischemic disease. Electronically Signed   By: Stevenson Elbe M.D.   On: 10/28/2023 19:08   CT ANGIO HEAD NECK W WO CM (CODE STROKE) Result Date: 10/28/2023 CLINICAL DATA:  Neuro deficit, acute, stroke suspected.  Aphasia EXAM: CT ANGIOGRAPHY HEAD AND NECK WITH AND WITHOUT CONTRAST TECHNIQUE: Multidetector CT imaging of the head and neck was performed using the standard protocol during bolus administration of intravenous contrast. Multiplanar CT image reconstructions and MIPs were obtained to evaluate the vascular anatomy. Carotid stenosis measurements (when applicable) are obtained utilizing NASCET criteria, using the distal internal carotid diameter as the denominator. RADIATION DOSE REDUCTION: This exam was performed according to the departmental dose-optimization program which includes automated exposure control, adjustment of the mA and/or kV according to patient size and/or use of iterative reconstruction technique. CONTRAST:  75mL OMNIPAQUE  IOHEXOL  350 MG/ML SOLN COMPARISON:  MR angiogram circle of Willis 04/12/2023. FINDINGS: CTA NECK FINDINGS Aortic arch: Atherosclerotic calcifications are present in the aorta and great vessel origins without focal stenosis. No aneurysm or dissection is present. Right carotid system: The right common carotid artery is within normal limits. Atherosclerotic calcifications are present at the right ICA  bifurcation without significant stenosis relative to the more distal vessel. The cervical right ICA is otherwise normal. Left carotid system: The left common carotid artery is within normal limits. Bifurcation is unremarkable. The cervical left ICA is normal. Vertebral arteries: Left vertebral artery is the dominant vessel. Both vertebral arteries originate from the subclavian arteries without significant stenosis. No significant stenosis is present in either vertebral artery in the neck. Skeleton: Endplate degenerative changes are most evident at C5-6 and C6-7. Slight degenerative anterolisthesis is present at C5-6. Alignment is otherwise anatomic. No focal osseous lesions are present. Other neck: The soft tissues of the neck are unremarkable. Salivary glands are within normal limits. Thyroid  is normal. No significant adenopathy is present. No focal mucosal or submucosal lesions are present. Upper chest: 2 separate 4 mm nodules in the right upper lobe are noted on images 22 and 27 of series 5. Scarring is present at the right lung apex. A 7 mm nodule is present in the left upper lobe on image 24 of series 5. Review of the MIP images confirms the above findings CTA HEAD FINDINGS Anterior circulation: Atherosclerotic calcifications are present within the cavernous internal carotid arteries bilaterally. No significant stenosis is present through the ICA terminus. The A1 and M1 segments are normal. The anterior communicating artery is patent. The MCA bifurcations are normal bilaterally. The ACA and MCA branch vessels are normal bilaterally. Posterior circulation: The left vertebral artery is dominant. PICA origin is visualized and normal. Vertebrobasilar junction and basilar artery are normal. The superior cerebellar arteries are patent. Both posterior cerebral arteries originate from basilar tip. The PCA branch vessels are normal bilaterally. Venous sinuses: The dural sinuses are patent. The straight sinus and deep  cerebral veins are intact. Cortical veins are within normal limits. No significant vascular malformation is evident. Anatomic variants: None Review of the MIP images  confirms the above findings IMPRESSION: 1. No emergent large vessel occlusion. 2. Atherosclerotic calcifications at the right ICA bifurcation and cavernous internal carotid arteries bilaterally without significant stenosis relative to the more distal vessels. 3. Normal variant CTA Circle of Willis without significant proximal stenosis, aneurysm, or branch vessel occlusion. 4. 7 mm nodule in the left upper lobe. Solid pulmonary nodule measuring 7 mm. Per Fleischner Society Guidelines, recommend a non-contrast Chest CT at 6-12 months. If patient is high risk for malignancy, consider an additional non-contrast Chest CT at 18-24 months. If patient is low risk for malignancy, non-contrast Chest CT at 18-24 months is optional. These guidelines do not apply to immunocompromised patients and patients with cancer. Follow up in patients with significant comorbidities as clinically warranted. For lung cancer screening, adhere to Lung-RADS guidelines. Reference: Radiology. 2017; 284(1):228-43. Electronically Signed   By: Audree Leas M.D.   On: 10/28/2023 15:38   CT HEAD CODE STROKE WO CONTRAST Result Date: 10/28/2023 CLINICAL DATA:  Code stroke. Neuro deficit, acute, stroke suspected. Aphasia. EXAM: CT HEAD WITHOUT CONTRAST TECHNIQUE: Contiguous axial images were obtained from the base of the skull through the vertex without intravenous contrast. RADIATION DOSE REDUCTION: This exam was performed according to the departmental dose-optimization program which includes automated exposure control, adjustment of the mA and/or kV according to patient size and/or use of iterative reconstruction technique. COMPARISON:  CT head without contrast 04/12/2023. FINDINGS: Brain: A remote lacunar infarct is present in the right caudate head and anterior limb of the  right internal capsule. Remote ischemic changes are again noted in the thalami bilaterally. No acute infarct, hemorrhage, or mass lesion is present. Atrophy and white matter changes are moderately advanced for age. The ventricles are proportionate to the degree of atrophy. No significant extraaxial fluid collection is present. A left paramedian lacunar infarct of the pons is new since the prior exam. Remote lacunar infarcts in the cerebellum bilaterally are stable. Vascular: Atherosclerotic calcifications are present within the cavernous internal carotid arteries bilaterally. No hyperdense vessel is present. Skull: Insert normal skull No significant extracranial soft tissue lesion is present. Sinuses/Orbits: The paranasal sinuses and mastoid air cells are clear. Bilateral lens replacements are noted. Globes and orbits are otherwise unremarkable. ASPECTS [facility] Stroke Program Early CT Score) - Ganglionic level infarction (caudate, lentiform nuclei, internal capsule, insula, M1-M3 cortex): 7/7 - Supraganglionic infarction (M4-M6 cortex): 3/3 Total score (0-10 with 10 being normal): 10/10 IMPRESSION: 1. Left paramedian pontine lacunar infarct is new since the prior study. It is age indeterminate. MRI could be used for further evaluation if clinically indicated. 2. Aspects is 10/10. 3. Remote lacunar infarcts of the right caudate head and anterior limb of the right internal capsule. 4. Remote ischemic changes in the thalami bilaterally. 5. Remote lacunar infarcts in the cerebellum bilaterally. 6. Atrophy and white matter disease is moderately advanced for age. This likely reflects the sequela of chronic microvascular ischemia. The above was relayed via text pager to Dr. Cleone Dad on 10/28/2023 at 15:22 . Electronically Signed   By: Audree Leas M.D.   On: 10/28/2023 15:23    Time coordinating discharge: 50 mins  SIGNED:  Unk Garb, DO [facility] 10/31/23, 9:25 AM

## 2023-10-31 NOTE — [place] FL2 (Signed)
 [place]  MEDICAID FL2 LEVEL OF CARE FORM     IDENTIFICATION  Patient Name: Matthew Hall Birthdate: 22-Jan-1946 Sex: male Admission Date (Current Location): 10/28/2023  [facility] and [place] Number:  Producer, television/film/video and Address:  [facility] [place], [address], [place], [place] [address]      Provider Number: [id number]  Attending Physician Name and Address:  Unk Garb, DO  Relative Name and Phone Number:  [name], [name] (Daughter)  [phone]    Current Level of Care: Hospital Recommended Level of Care: Assisted Living Facility Prior Approval Number:    Date Approved/Denied:   PASRR Number:    Discharge Plan: Other (Comment) (Assisted Living Facility)    Current Diagnoses: Patient Active Problem List   Diagnosis Date Noted   Aphasia 10/28/2023   Lung nodule 10/28/2023   Chronic respiratory failure with hypoxia (HCC) 10/25/2023   Gait abnormality 09/24/2023   DM2 (diabetes mellitus, type 2) (HCC) 07/13/2023   Paroxysmal atrial fibrillation (HCC) 07/13/2023   GAD (generalized anxiety disorder) 07/13/2023   Chronic heart failure with mildly reduced ejection fraction (HFmrEF, 41-49%) (HCC) 07/13/2023   Malnutrition of moderate degree 07/13/2023   Pain due to onychomycosis of toenails of both feet 07/02/2023   Paronychia of great toe of left foot 07/02/2023   Weakness of right foot 05/09/2023   Cerebrovascular accident (CVA) (HCC) 04/12/2023   Anxiety 04/12/2023   Diabetic peripheral neuropathy (HCC) 12/27/2020   Paresthesia 12/01/2020   Elevated sed rate 05/21/2017   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) 03/01/2017   Vasculitis (HCC) 02/20/2017   Atrial fibrillation, chronic (HCC) 07/09/2016   BRONCHIECTASIS W/O ACUTE EXACERBATION 03/31/2008   Essential hypertension 09/13/2007   Allergic rhinitis 09/13/2007   EMPHYSEMA 09/13/2007    Orientation RESPIRATION BLADDER Height & Weight     Self, Time, Situation, Place  Normal Continent  Weight: 144 lb 9.6 oz (65.6 kg) Height:  5\' 11"  (180.3 cm)  BEHAVIORAL SYMPTOMS/MOOD NEUROLOGICAL BOWEL NUTRITION STATUS      Continent Diet Diabetic   AMBULATORY STATUS COMMUNICATION OF NEEDS Skin   Limited Assist Verbally Normal                       Personal Care Assistance Level of Assistance  Feeding, Dressing, Bathing Bathing Assistance: Limited assistance Feeding assistance: Independent Dressing Assistance: Limited assistance     Functional Limitations Info  Sight, Hearing, Speech Sight Info: Adequate Hearing Info: Adequate Speech Info: Adequate    SPECIAL CARE FACTORS FREQUENCY  PT (By licensed PT), OT (By licensed OT)     PT Frequency: 3-5x/week OT Frequency: 3-5x/week            Contractures Contractures Info: Not present    Additional Factors Info  Code Status, Allergies Code Status Info: FULL Allergies Info: Simvastatin  Cortisone  Albuterol   Augmentin (Amoxicillin-pot Clavulanate)  Ciprofloxacin  Contrast Media (Iodinated Contrast Media)  Flagyl  (Metronidazole )  Latex  Sulfa  Antibiotics           Current Medications (10/31/2023):  This is the current hospital active medication list Current Facility-Administered Medications  Medication Dose Route Frequency Provider Last Rate Last Admin   acetaminophen  (TYLENOL ) tablet 650 mg  650 mg Oral Q6H PRN Opyd, Timothy S, MD       Or   acetaminophen  (TYLENOL ) suppository 650 mg  650 mg Rectal Q6H PRN Opyd, Timothy S, MD       ALPRAZolam  (XANAX ) tablet 0.25 mg  0.25 mg Oral BID PRN  Opyd, Timothy S, MD   0.25 mg at 10/30/23 2256   apixaban  (ELIQUIS ) tablet 5 mg  5 mg Oral BID Opyd, Timothy S, MD   5 mg at 10/31/23 0930   budeson-glycopyrrolate -formoterol  (BREZTRI ) 160-9-4.8 MCG/ACT inhaler 2 puff  2 puff Inhalation BID Opyd, Timothy S, MD   2 puff at 10/30/23 2055   guaiFENesin  (MUCINEX ) 12 hr tablet 600 mg  600 mg Oral BID Opyd, Timothy S, MD   600 mg at 10/31/23 0930   hydrochlorothiazide  (HYDRODIURIL )  tablet 12.5 mg  12.5 mg Oral Daily Opyd, Timothy S, MD   12.5 mg at 10/31/23 0930   insulin  aspart (novoLOG ) injection 0-9 Units  0-9 Units Subcutaneous TID WC Amponsah, Prosper M, MD   1 Units at 10/30/23 1305   levalbuterol  (XOPENEX ) nebulizer solution 0.63 mg  0.63 mg Nebulization Q6H PRN Opyd, Timothy S, MD       metoprolol  succinate (TOPROL -XL) 24 hr tablet 25 mg  25 mg Oral BID Opyd, Timothy S, MD   25 mg at 10/31/23 0930   pantoprazole  (PROTONIX ) EC tablet 40 mg  40 mg Oral Daily Opyd, Timothy S, MD   40 mg at 10/31/23 0930   prochlorperazine  (COMPAZINE ) injection 5 mg  5 mg Intravenous Q6H PRN Opyd, Santana Cue, MD       senna-docusate (Senokot-S) tablet 1 tablet  1 tablet Oral QHS PRN Opyd, Timothy S, MD       sodium chloride  flush (NS) 0.9 % injection 3 mL  3 mL Intravenous Q12H Opyd, Timothy S, MD   3 mL at 10/31/23 0932     Discharge Medications: Medication List       TAKE these medications     ALPRAZolam  0.25 MG tablet Commonly known as: XANAX  Take 1 tablet (0.25 mg total) by mouth 2 (two) times daily as needed for anxiety or sleep.    Breztri  Aerosphere 160-9-4.8 MCG/ACT Aero inhaler Generic drug: budeson-glycopyrrolate -formoterol  Inhale 2 puffs into the lungs 2 (two) times daily.    Eliquis  5 MG Tabs tablet Generic drug: apixaban  Take 1 tablet (5 mg total) by mouth 2 (two) times daily.    feeding supplement Liqd Take 237 mLs by mouth 2 (two) times daily between meals.    FT Mucus Relief 12HR 600 MG 12 hr tablet Generic drug: guaiFENesin  Take 1 tablet (600 mg total) by mouth 2 (two) times daily.    hydrochlorothiazide  12.5 MG tablet Commonly known as: HYDRODIURIL  Take 1 tablet (12.5 mg total) by mouth as needed.    levalbuterol  0.63 MG/3ML nebulizer solution Commonly known as: Xopenex  Take 3 mLs (0.63 mg total) by nebulization every 6 (six) hours as needed for wheezing or shortness of breath.    levalbuterol  45 MCG/ACT inhaler Commonly known as: XOPENEX   HFA Inhale 2 puffs into the lungs every 6 (six) hours as needed for wheezing.    metFORMIN  500 MG 24 hr tablet Commonly known as: GLUCOPHAGE -XR Take 1 tablet (500 mg total) by mouth daily.    metoprolol  succinate 25 MG 24 hr tablet Commonly known as: TOPROL -XL Take 1 tablet (25 mg total) by mouth 2 (two) times daily.    omeprazole  20 MG capsule Commonly known as: PRILOSEC Take 1 capsule (20 mg total) by mouth daily.    triamcinolone  55 MCG/ACT Aero nasal inhaler Commonly known as: NASACORT  Place 1 spray into the nose daily.    Tylenol  325 MG Caps Generic drug: Acetaminophen  Take 1-2 capsules by mouth every 6 (six) hours as needed (pain).  VITAMIN B12 PO Take 1 tablet by mouth daily.    Relevant Imaging Results:  Relevant Lab Results:   Additional Information SSN:[id number]  Farhiya Rosten A [place], LCSW

## 2023-10-31 NOTE — Assessment & Plan Note (Signed)
 10-31-2023 stable. Chronic. Continue prn xanax 

## 2023-10-31 NOTE — Telephone Encounter (Signed)
 Copied from CRM [id number]. Topic: Appointments - Scheduling Inquiry for Clinic >> Oct 31, 2023 11:53 AM Isabell A wrote: Reason for CRM: Patients daughter Odilia Bennett would like to schedule a biopsy or CT scan with contrast, patient is currently being discharged from the hospital.   Callback number: [phone]  I called and spoke with Odilia Bennett, pt's daughter, ok per DPR.  She states pt just d/c from the hospital today and pulm nodule was mentioned on d/c He had CT Head/Neck 10/28/23 that showed 7 mm left upper lobe nodule   Per d/c summary:  Lung nodule 10-31-2023 seen on his CTA head/neck.   Per Fleischner Society Guidelines, recommend a non-contrast Chest CT at 6-12 months. If patient is high risk for malignancy, consider an additional non-contrast Chest CT at 18-24 months. If patient is low risk for malignancy, non-contrast Chest CT at 18-24 months is optional.  Dr Villa Greaser, daughter is asking what you think about the best timing to do a chest ct to f/u on this.  Please advise, thanks!

## 2023-10-31 NOTE — TOC Transition Note (Signed)
 Transition of Care [facility]) - Discharge Note   Patient Details  Name: Matthew Hall MRN: [id number] Date of Birth: [birth year]  Transition of Care [facility]) CM/SW Contact:  Jonathan Neighbor, RN Phone Number: 10/31/2023, 10:54 AM   Clinical Narrative:     Pt is [facility] with [facility]. CM has faxed orders for [facility] to [facility].  Pts daughter to provide transportation to [facility].   Final next level of care: Home w Home Health Services Barriers to Discharge: No Barriers Identified   Patient Goals and CMS Choice            Discharge Placement                       Discharge Plan and Services Additional resources added to the After Visit Summary for                                       Social Drivers of Health (SDOH) Interventions SDOH Screenings   Food Insecurity: No Food Insecurity (10/29/2023)  Housing: Low Risk  (10/29/2023)  Transportation Needs: No Transportation Needs (10/29/2023)  Utilities: Not At Risk (10/29/2023)  Depression (PHQ2-9): Low Risk  (10/03/2023)  Social Connections: Moderately Isolated (10/29/2023)  Tobacco Use: Medium Risk (10/28/2023)     Readmission Risk Interventions    07/15/2023    2:01 PM 06/22/2023   11:49 AM  Readmission Risk Prevention Plan  Transportation Screening Complete Complete  Home Care Screening  Complete  Medication Review (RN CM)  Complete  Medication Review (RN Care Manager) Complete   PCP or Specialist appointment within 3-5 days of discharge Complete   HRI or Home Care Consult Complete   SW Recovery Care/Counseling Consult Complete   Palliative Care Screening Not Applicable   Skilled Nursing Facility Not Applicable

## 2023-10-31 NOTE — Assessment & Plan Note (Signed)
 10-31-2023 seen on his CTA head/neck.   Per Fleischner Society Guidelines, recommend a non-contrast Chest CT at 6-12 months. If patient is high risk for malignancy, consider an additional non-contrast Chest CT at 18-24 months. If patient is low risk for malignancy, non-contrast Chest CT at 18-24 months is optional.

## 2023-10-31 NOTE — Telephone Encounter (Signed)
**Note De-identified  [place] Obfuscation** Please advise 

## 2023-10-31 NOTE — Assessment & Plan Note (Addendum)
 10-31-2023 stable. Chronic.  On toprol -XL and eliquis .

## 2023-10-31 NOTE — Subjective & Objective (Signed)
 Pt seen and examined. Stable. Ready for DC back to [facility] independent living.

## 2023-10-31 NOTE — Assessment & Plan Note (Addendum)
 10-31-2023 stable. Chronic.  On meformin.   Lab Results  Component Value Date   HGBA1C 6.4 09/10/2023   HGBA1C 6.9 (H) 05/09/2023   HGBA1C 6.9 (H) 04/12/2023

## 2023-10-31 NOTE — Assessment & Plan Note (Signed)
 10-31-2023 stable. Chronic. Currently on RA.

## 2023-10-31 NOTE — Assessment & Plan Note (Signed)
 10-31-2023 MRI brain negative for CVA. Neurology signed off case.

## 2023-11-01 ENCOUNTER — Other Ambulatory Visit (HOSPITAL_COMMUNITY): Payer: Self-pay

## 2023-11-01 ENCOUNTER — Telehealth: Payer: Self-pay

## 2023-11-01 NOTE — Transitions of Care (Post Inpatient/ED Visit) (Signed)
 11/01/2023  Name: Matthew Hall MRN: [id number] DOB: 18-Jul-1945  Today's TOC FU Call Status: Today's TOC FU Call Status:: Successful TOC FU Call Completed TOC FU Call Complete Date: 11/01/23 Patient's Name and Date of Birth confirmed.  Transition Care Management Follow-up Telephone Call Date of Discharge: 10/31/23 Discharge Facility: [facility]) Type of Discharge: Inpatient Admission How have you been since you were released from the hospital?: Better Any questions or concerns?: No  Items Reviewed: Did you receive and understand the discharge instructions provided?: Yes Medications obtained,verified, and reconciled?: Yes (Medications Reviewed) Any new allergies since your discharge?: No Dietary orders reviewed?: NA Do you have support at home?: Yes People in Home [RPT]: child(ren), adult  Medications Reviewed Today: Medications Reviewed Today     Reviewed by Roddie Cisco, CMA (Certified Medical Assistant) on 11/01/23 at 1118  Med List Status: <None>   Medication Order Taking? Sig Documenting Provider Last Dose Status Informant  Acetaminophen  (TYLENOL ) 325 MG CAPS [id number] No Take 1-2 capsules by mouth every 6 (six) hours as needed (pain). [provider] Unknown Active Self, Pharmacy Records, Family Member  ALPRAZolam  (XANAX ) 0.25 MG tablet [id number]  Take 1 tablet (0.25 mg total) by mouth 2 (two) times daily as needed for anxiety or sleep. Unk Garb, DO  Active   apixaban  (ELIQUIS ) 5 MG TABS tablet [id number] No Take 1 tablet (5 mg total) by mouth 2 (two) times daily. [name], [name], [place] 10/28/2023  9:00 AM Active Self, Family Member, Pharmacy Records  budeson-glycopyrrolate -formoterol  (BREZTRI  AEROSPHERE) 160-9-4.8 MCG/ACT AERO inhaler [id number] No Inhale 2 puffs into the lungs 2 (two) times daily. Mandy Second, [place] 10/27/2023 Evening Active Self, Family Member, Pharmacy Records  Cyanocobalamin  (VITAMIN B12 PO) [id number] No Take 1 tablet by mouth daily.  [provider] 10/28/2023 Morning Active Self, Pharmacy Records, Family Member  feeding supplement (ENSURE ENLIVE / ENSURE PLUS) LIQD [id number] No Take 237 mLs by mouth 2 (two) times daily between meals. [name], [name], MD Unknown Active Self, Pharmacy Records, Family Member  guaiFENesin  (MUCINEX ) 600 MG 12 hr tablet [id number] No Take 1 tablet (600 mg total) by mouth 2 (two) times daily. [name], [name], [place] 10/28/2023 Morning Active Self, Family Member, Pharmacy Records  hydrochlorothiazide  (HYDRODIURIL ) 12.5 MG tablet [id number] No Take 1 tablet (12.5 mg total) by mouth as needed. Mandy Second, PA Past Week Active Self, Family Member, Pharmacy Records           Med Note Alen Husbands, [place] C   Sun Oct 28, 2023  7:12 PM) Daughter and pt states he takes  1/2 tablet by mouth daily  levalbuterol  (XOPENEX  HFA) 45 MCG/ACT inhaler [id number] No Inhale 2 puffs into the lungs every 6 (six) hours as needed for wheezing. [name], Macdonald Savoy, NP Past Week Active Self, Family Member, Pharmacy Records  levalbuterol  (XOPENEX ) 0.63 MG/3ML nebulizer solution [id number] No Take 3 mLs (0.63 mg total) by nebulization every 6 (six) hours as needed for wheezing or shortness of breath. [name], Macdonald Savoy, NP Past Month Active Self, Pharmacy Records, Family Member  metFORMIN  (GLUCOPHAGE -XR) 500 MG 24 hr tablet [id number] No Take 1 tablet (500 mg total) by mouth daily.  10/27/2023 Evening Active Self, Family Member, Pharmacy Records  metoprolol  succinate (TOPROL -XL) 25 MG 24 hr tablet [id number] No Take 1 tablet (25 mg total) by mouth 2 (two) times daily. Gerard Knight, MD 10/28/2023 Morning Active Self, Family Member, Pharmacy Records  omeprazole  [facility]) 20 MG capsule [id number] No Take 1 capsule (20  mg total) by mouth daily. [name], [name], [place] 10/28/2023 Morning Active Self, Family Member, Pharmacy Records  triamcinolone  (NASACORT ) 55 MCG/ACT AERO nasal inhaler [id number] No Place 1 spray into the nose daily. Mandy Second, [place] 10/28/2023 Morning Active Self, Family Member, Pharmacy Records            Home Care and Equipment/Supplies: Were Home Health Services Ordered?: Yes Name of Home Health Agency:: PT/OT Has Agency set up a time to come to your home?: Yes Any new equipment or medical supplies ordered?: NA  Functional Questionnaire:    Follow up appointments reviewed: PCP Follow-up appointment confirmed?: Yes Date of PCP follow-up appointment?: 11/14/23 Specialist Hospital Follow-up appointment confirmed?: NA Do you need transportation to your follow-up appointment?: No Do you understand care options if your condition(s) worsen?: Yes-patient verbalized understanding    SIGNATURE [place]

## 2023-11-05 ENCOUNTER — Other Ambulatory Visit (HOSPITAL_COMMUNITY): Payer: Self-pay

## 2023-11-05 ENCOUNTER — Other Ambulatory Visit: Payer: Self-pay

## 2023-11-05 ENCOUNTER — Ambulatory Visit: Payer: Medicare Other | Admitting: Cardiology

## 2023-11-09 ENCOUNTER — Ambulatory Visit (HOSPITAL_BASED_OUTPATIENT_CLINIC_OR_DEPARTMENT_OTHER): Payer: Medicare Other | Admitting: Pulmonary Disease

## 2023-11-09 ENCOUNTER — Other Ambulatory Visit

## 2023-11-14 ENCOUNTER — Other Ambulatory Visit (HOSPITAL_BASED_OUTPATIENT_CLINIC_OR_DEPARTMENT_OTHER): Payer: Self-pay

## 2023-11-14 ENCOUNTER — Other Ambulatory Visit: Payer: Self-pay

## 2023-11-14 ENCOUNTER — Encounter: Payer: Self-pay | Admitting: Urgent Care

## 2023-11-14 ENCOUNTER — Ambulatory Visit (INDEPENDENT_AMBULATORY_CARE_PROVIDER_SITE_OTHER): Admitting: Urgent Care

## 2023-11-14 ENCOUNTER — Telehealth: Payer: Self-pay

## 2023-11-14 ENCOUNTER — Other Ambulatory Visit (HOSPITAL_COMMUNITY): Payer: Self-pay

## 2023-11-14 VITALS — BP 132/72 | HR 63 | Wt 149.0 lb

## 2023-11-14 DIAGNOSIS — R0989 Other specified symptoms and signs involving the circulatory and respiratory systems: Secondary | ICD-10-CM

## 2023-11-14 DIAGNOSIS — I480 Paroxysmal atrial fibrillation: Secondary | ICD-10-CM | POA: Diagnosis not present

## 2023-11-14 DIAGNOSIS — F411 Generalized anxiety disorder: Secondary | ICD-10-CM | POA: Diagnosis not present

## 2023-11-14 DIAGNOSIS — L989 Disorder of the skin and subcutaneous tissue, unspecified: Secondary | ICD-10-CM | POA: Diagnosis not present

## 2023-11-14 DIAGNOSIS — R5381 Other malaise: Secondary | ICD-10-CM

## 2023-11-14 DIAGNOSIS — R918 Other nonspecific abnormal finding of lung field: Secondary | ICD-10-CM

## 2023-11-14 DIAGNOSIS — R269 Unspecified abnormalities of gait and mobility: Secondary | ICD-10-CM

## 2023-11-14 MED ORDER — APIXABAN 5 MG PO TABS
5.0000 mg | ORAL_TABLET | Freq: Two times a day (BID) | ORAL | 3 refills | Status: DC
  Filled 2023-11-14: qty 60, 30d supply, fill #0

## 2023-11-14 MED ORDER — BUSPIRONE HCL 10 MG PO TABS
ORAL_TABLET | ORAL | 0 refills | Status: DC
  Filled 2023-11-14: qty 15, 14d supply, fill #0
  Filled 2023-11-14: qty 17, 15d supply, fill #0
  Filled 2023-11-14: qty 60, 36d supply, fill #0

## 2023-11-14 MED ORDER — APIXABAN 5 MG PO TABS
5.0000 mg | ORAL_TABLET | Freq: Two times a day (BID) | ORAL | 3 refills | Status: DC
  Filled 2023-11-14 – 2023-11-28 (×3): qty 60, 30d supply, fill #0
  Filled 2023-12-12 – 2023-12-21 (×2): qty 60, 30d supply, fill #1
  Filled 2024-01-16: qty 60, 30d supply, fill #2
  Filled 2024-02-21 – 2024-02-26 (×2): qty 60, 30d supply, fill #3

## 2023-11-14 MED ORDER — BUSPIRONE HCL 10 MG PO TABS
ORAL_TABLET | ORAL | 0 refills | Status: DC
Start: 2023-11-14 — End: 2023-11-14
  Filled 2023-11-14: qty 60, 36d supply, fill #0

## 2023-11-14 NOTE — Telephone Encounter (Signed)
 I already read it and responded. Thank you for the FYI

## 2023-11-14 NOTE — Patient Instructions (Addendum)
 Please keep your follow up with Dermatology Dec 10, 2023 to evaluate the lesions on your face.  Please also follow up with pulmonology - they will repeat your CT scan in October timeframe.  Please START buspirone. This will be a taper as follows:  Please add Buspirone to help with anxiety and blood pressure management. Start with 1/2 tab once daily x 5 days,  then increase to 1/2 tab twice daily x 5 days,  then 1 tab in AM, 1/2 tab in PM x 5 days,  then continue with 1 tab twice daily.   I have also refilled your eliquis .   I have sent these to the [facility] to start receiving pill packs.  Please schedule a follow up for one month from now.

## 2023-11-14 NOTE — Telephone Encounter (Signed)
 FYI. Please see below.  Copied from CRM [id number]. Topic: General - Other >> Nov 14, 2023  9:17 AM Albertha Alosa wrote: Reason for CRM: Patient daughter to call in to inform Matthew Hall she will not be in with patient today, but did send her a mychart message she would like for her to read regarding patients health

## 2023-11-14 NOTE — Progress Notes (Signed)
 Established Patient Office Visit  Subjective:  Patient ID: Matthew Hall, male    DOB: January 07, 1946  Age: 78 y.o. MRN: [id number]  Chief Complaint  Patient presents with   Hospitalization Follow-up    Hp follow up.     HPI  Discussed the use of AI scribe software for clinical note transcription with the patient, who gave verbal consent to proceed.  History of Present Illness   Matthew Hall is a 78 year old male who presents with a recent episode of aphasia and fluctuating blood pressure.  On April 20th, he experienced sudden difficulty speaking and comprehending while talking with his daughter, leading to an ER visit due to concerns of a stroke. An MRI, two CT scans, and an EEG ruled out an acute stroke and seizures. He was hospitalized for three days and discharged on April 23rd. Since discharge, he has not experienced any further episodes similar to the one on April 20th. He attributes the episode to stress, which he believes is exacerbated by his wife's health issues and her current stay in a rehabilitation facility due to a pressure sore and MRSA infection.  He has a history of fluctuating blood pressure, often described as 'normal, high, normal, high.' He has been taking Xanax  for stress management for thirty years, but it is sometimes withheld during hospital stays. He also takes hydrochlorothiazide , which helps with swelling and fluid retention, and believes it also lowers his blood pressure. He does not take any other blood pressure medications regularly.  He wakes up frequently at night to urinate, which he attributes to his medication. He drinks water throughout the day, despite not particularly liking it, and limits his intake of coffee and soda. No alcohol  consumption.  He has a history of neuropathy in his feet and a past sciatic nerve issue since 1969, causing numbness in the back of his right leg and foot. He mentions a past crushed vertebra and has been told he has  neuropathy in his feet.  He has a pulmonary nodule that was deemed benign, with a follow-up CT scan scheduled for October. This is being managed by Dr. Villa Greaser.  He has a dermatology appointment scheduled for June 2nd to evaluate a skin issue, and he mentions a past procedure where the tops of his ears were removed via Mohs procedure.      Patient Active Problem List   Diagnosis Date Noted   Aphasia 10/28/2023   Lung nodule 10/28/2023   Chronic respiratory failure with hypoxia (HCC) 10/25/2023   Gait abnormality 09/24/2023   DM2 (diabetes mellitus, type 2) (HCC) 07/13/2023   Paroxysmal atrial fibrillation (HCC) 07/13/2023   GAD (generalized anxiety disorder) 07/13/2023   Chronic heart failure with mildly reduced ejection fraction (HFmrEF, 41-49%) (HCC) 07/13/2023   Malnutrition of moderate degree 07/13/2023   Pain due to onychomycosis of toenails of both feet 07/02/2023   Paronychia of great toe of left foot 07/02/2023   Weakness of right foot 05/09/2023   Cerebrovascular accident (CVA) (HCC) 04/12/2023   Anxiety 04/12/2023   Diabetic peripheral neuropathy (HCC) 12/27/2020   Paresthesia 12/01/2020   Elevated sed rate 05/21/2017   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) 03/01/2017   Vasculitis (HCC) 02/20/2017   Atrial fibrillation, chronic (HCC) 07/09/2016   BRONCHIECTASIS W/O ACUTE EXACERBATION 03/31/2008   Essential hypertension 09/13/2007   Allergic rhinitis 09/13/2007   EMPHYSEMA 09/13/2007   Past Medical History:  Diagnosis Date   Anxiety    Aortic atherosclerosis (HCC)    Atrial  fibrillation (HCC)    Basal cell carcinoma    Bladder cancer [facility])    Bladder tumor    Chronic systolic heart failure (HCC)    Colon polyps    COPD (chronic obstructive pulmonary disease) (HCC)    Cutaneous vasculitis    Diabetes (HCC)    Diabetic peripheral neuropathy (HCC) 12/27/2020   Diverticulosis    Essential hypertension    GERD (gastroesophageal reflux disease)    History of pneumonia     Nephrolithiasis    Osteoarthritis    Right foot drop    Stroke [facility])    Past Surgical History:  Procedure Laterality Date   BRONCHIAL WASHINGS  06/19/2023   Procedure: BRONCHIAL WASHINGS;  Surgeon: Joesph Mussel, DO;  Location: [facility];  Service: Cardiopulmonary;;   COLONOSCOPY     Kidney stones removed  1996   SKIN CANCER EXCISION Left    2003   TRANSURETHRAL RESECTION OF BLADDER TUMOR  1996   VIDEO BRONCHOSCOPY N/A 06/19/2023   Procedure: VIDEO BRONCHOSCOPY WITHOUT FLUORO;  Surgeon: Joesph Mussel, DO;  Location: [facility];  Service: Cardiopulmonary;  Laterality: N/A;   Social History   Socioeconomic History   Marital status: Married    Spouse name: Not on file   Number of children: 3   Years of education: Not on file   Highest education level: Not on file  Occupational History   Occupation: Pipefilter/retired    Comment: [employer]  Tobacco Use   Smoking status: Former    Current packs/day: 0.00    Average packs/day: 1 pack/day for 30.0 years (30.0 ttl pk-yrs)    Types: Cigarettes    Start date: 07/06/1981    Quit date: 07/07/2011    Years since quitting: 12.3   Smokeless tobacco: Never  Vaping Use   Vaping status: Never Used  Substance and Sexual Activity   Alcohol  use: No    Alcohol /week: 0.0 standard drinks of alcohol    Drug use: No   Sexual activity: Not Currently  Other Topics Concern   Not on file  Social History Narrative   Lives with wife   Caffeine- 1 cup a day   Right handed    Social Drivers of Health   Financial Resource Strain: Not on file  Food Insecurity: No Food Insecurity (10/29/2023)   Hunger Vital Sign    Worried About Running Out of Food in the Last Year: Never true    Ran Out of Food in the Last Year: Never true  Transportation Needs: No Transportation Needs (10/29/2023)   PRAPARE - Administrator, Civil Service (Medical): No    Lack of Transportation (Non-Medical): No  Physical Activity: Not on file  Stress: Not  on file  Social Connections: Moderately Isolated (10/29/2023)   Social Connection and Isolation Panel [NHANES]    Frequency of Communication with Friends and Family: More than three times a week    Frequency of Social Gatherings with Friends and Family: Once a week    Attends Religious Services: Never    Database administrator or Organizations: No    Attends Banker Meetings: Never    Marital Status: Married  Catering manager Violence: Not At Risk (10/29/2023)   Humiliation, Afraid, Rape, and Kick questionnaire    Fear of Current or Ex-Partner: No    Emotionally Abused: No    Physically Abused: No    Sexually Abused: No      ROS: as noted in HPI  Objective:  BP 132/72   Pulse 63   Wt 149 lb (67.6 kg)   SpO2 97%   BMI 20.78 kg/m  BP Readings from Last 3 Encounters:  11/14/23 132/72  10/31/23 (!) 144/80  10/25/23 124/76   Wt Readings from Last 3 Encounters:  11/14/23 149 lb (67.6 kg)  10/28/23 144 lb 9.6 oz (65.6 kg)  10/25/23 144 lb 9.6 oz (65.6 kg)      Physical Exam Vitals and nursing note reviewed.  Constitutional:      General: He is not in acute distress.    Appearance: Normal appearance. He is not ill-appearing, toxic-appearing or diaphoretic.  HENT:     Head: Normocephalic and atraumatic.     Mouth/Throat:     Mouth: Mucous membranes are moist.  Eyes:     General: No scleral icterus.       Right eye: No discharge.        Left eye: No discharge.  Neck:     Thyroid : No thyroid  mass, thyromegaly or thyroid  tenderness.  Cardiovascular:     Rate and Rhythm: Normal rate. Rhythm irregular.     Pulses: Normal pulses.  Pulmonary:     Effort: Pulmonary effort is normal. No respiratory distress.     Breath sounds: No stridor. No wheezing or rhonchi.     Comments: No audible wheezing or adventitious sounds. No crackles or rales, but decreased respiratory excursion Musculoskeletal:        General: Deformity (R foot in brace due to foot drop)  present.     Cervical back: Normal range of motion and neck supple. No rigidity or tenderness.     Right lower leg: No edema.     Left lower leg: No edema.  Lymphadenopathy:     Cervical: No cervical adenopathy.  Skin:    General: Skin is warm and dry.     Coloration: Skin is not jaundiced.     Findings: Lesion (three lesions to L cheek, 1 lesion on R cheek) present. No erythema or rash.  Neurological:     General: No focal deficit present.     Mental Status: He is alert and oriented to person, place, and time.     Motor: Weakness present.     Gait: Gait abnormal.     Comments: Pt ambulates with rollator  Psychiatric:        Mood and Affect: Mood normal.        Behavior: Behavior normal.      No results found for any visits on 11/14/23.  Last CBC Lab Results  Component Value Date   WBC 10.0 10/31/2023   HGB 12.8 (L) 10/31/2023   HCT 41.2 10/31/2023   MCV 90.7 10/31/2023   MCH 28.2 10/31/2023   RDW 14.1 10/31/2023   PLT 314 10/31/2023   Last metabolic panel Lab Results  Component Value Date   GLUCOSE 99 10/31/2023   NA 136 10/31/2023   K 3.8 10/31/2023   CL 97 (L) 10/31/2023   CO2 29 10/31/2023   BUN 12 10/31/2023   CREATININE 0.85 10/31/2023   GFRNONAA >60 10/31/2023   CALCIUM  9.3 10/31/2023   PHOS 2.9 08/14/2023   PROT 6.7 10/28/2023   ALBUMIN 3.1 (L) 10/28/2023   LABGLOB 3.3 05/09/2023   LABGLOB 2.8 05/09/2023   BILITOT 1.1 10/28/2023   ALKPHOS 43 10/28/2023   AST 31 10/28/2023   ALT 12 10/28/2023   ANIONGAP 10 10/31/2023   Last lipids Lab Results  Component Value Date  CHOL 118 09/10/2023   HDL 31.40 (L) 09/10/2023   LDLCALC 69 09/10/2023   TRIG 89.0 09/10/2023   CHOLHDL 4 09/10/2023   Last hemoglobin A1c Lab Results  Component Value Date   HGBA1C 6.4 09/10/2023   Last thyroid  functions Lab Results  Component Value Date   TSH 1.36 09/10/2023   Last vitamin D  Lab Results  Component Value Date   VD25OH 81.52 09/10/2023   Last  vitamin B12 and Folate Lab Results  Component Value Date   VITAMINB12 >1537 (H) 09/10/2023   FOLATE 8.8 09/10/2023      The ASCVD Risk score (Arnett DK, et al., 2019) failed to calculate for the following reasons:   Risk score cannot be calculated because patient has a medical history suggesting prior/existing ASCVD  Assessment & Plan:  GAD (generalized anxiety disorder) -     busPIRone  HCl; Take 0.5 tablets (5 mg total) by mouth daily for 5 days, THEN 0.5 tablets (5 mg total) 2 (two) times daily for 5 days, THEN 1 tablet (10 mg total) in the morning and 0.5 tablet (5mg  total) in the evening for 5 days, THEN 1 tablet (10 mg total) 2 (two) times daily  Dispense: 60 tablet; Refill: 0  Pulmonary nodules  Skin lesion of face  Paroxysmal atrial fibrillation (HCC) -     Apixaban ; Take 1 tablet (5 mg total) by mouth 2 (two) times daily.  Dispense: 60 tablet; Refill: 3  Labile blood pressure  Physical deconditioning  Gait abnormality  Assessment and Plan    Labile hypertension Labile hypertension with significant fluctuations, likely stress-exacerbated. Current management includes hydrochlorothiazide . Metoprolol  not first-line for blood pressure control. Stress major contributor to fluctuations. Buspirone  recommended for stress and anxiety management to stabilize blood pressure. - Start buspirone  to prevent stress and anxiety, stabilize blood pressure. - Continue hydrochlorothiazide  for fluid management.  Anxiety Chronic anxiety managed with Xanax  for 30 years. Xanax  effective for acute stress, not prevention. Buspirone  recommended for long-term anxiety and stress management without sedation, dizziness, or drowsiness. Tapering schedule planned to reach maintenance dose. - Start buspirone  with tapering schedule: 5 mg once daily, then 5 mg twice daily, then 10 mg morning and 5 mg night, finally 10 mg twice daily. - Continue Xanax  as needed for acute stress.  Stress-induced  aphasia Episode of aphasia on April 20th, suspected stress-induced. No stroke or seizure identified. Stress management crucial to prevent recurrence. Buspirone  recommended to manage stress and prevent future aphasia. - Start buspirone  to manage stress and prevent future aphasia episodes.  Peripheral neuropathy Peripheral neuropathy with foot numbness and sciatic nerve issues since 1969. Possible lower spinal involvement due to crushed vertebra. MRI of lumbar spine scheduled for evaluation. - Proceed with MRI of lumbar spine to evaluate nerve involvement.  Benign pulmonary nodule Benign pulmonary nodule identified on CT scan. No significant change observed, suggesting benign nature. Follow-up CT scan scheduled for October. - Repeat CT scan in October to monitor pulmonary nodule, being followed by Dr. Villa Greaser.   Skin lesions New skin lesions noted on face, primarily on L but one on R. Pt follows with specialist at [facility], has follow up scheduled in early June.        Return in about 4 weeks (around 12/12/2023).   Mandy Second, PA

## 2023-11-19 ENCOUNTER — Other Ambulatory Visit: Payer: Self-pay

## 2023-11-19 ENCOUNTER — Other Ambulatory Visit (HOSPITAL_COMMUNITY): Payer: Self-pay

## 2023-11-19 MED ORDER — BUSPIRONE HCL 10 MG PO TABS
10.0000 mg | ORAL_TABLET | Freq: Two times a day (BID) | ORAL | 1 refills | Status: DC
  Filled 2023-11-19 – 2023-11-28 (×2): qty 180, 90d supply, fill #0
  Filled 2023-11-28: qty 60, 30d supply, fill #0
  Filled 2023-12-12 – 2023-12-21 (×2): qty 60, 30d supply, fill #1
  Filled 2024-01-16: qty 60, 30d supply, fill #2
  Filled 2024-02-21 – 2024-02-26 (×2): qty 60, 30d supply, fill #3
  Filled 2024-03-24: qty 60, 30d supply, fill #4
  Filled 2024-04-21 – 2024-04-28 (×2): qty 60, 30d supply, fill #5

## 2023-11-19 NOTE — Addendum Note (Signed)
 Addended by: Leeland Lovelady on: 11/19/2023 04:46 PM   Modules accepted: Orders

## 2023-11-20 ENCOUNTER — Other Ambulatory Visit: Payer: Self-pay

## 2023-11-22 ENCOUNTER — Ambulatory Visit
Admission: RE | Admit: 2023-11-22 | Discharge: 2023-11-22 | Disposition: A | Discharge status: Home / Self Care | Source: Ambulatory Visit | Attending: Neurology | Admitting: Neurology

## 2023-11-22 DIAGNOSIS — I639 Cerebral infarction, unspecified: Secondary | ICD-10-CM

## 2023-11-22 DIAGNOSIS — R269 Unspecified abnormalities of gait and mobility: Secondary | ICD-10-CM

## 2023-11-22 DIAGNOSIS — R29898 Other symptoms and signs involving the musculoskeletal system: Secondary | ICD-10-CM | POA: Diagnosis not present

## 2023-11-26 ENCOUNTER — Other Ambulatory Visit: Payer: Self-pay

## 2023-11-27 ENCOUNTER — Other Ambulatory Visit: Payer: Self-pay

## 2023-11-27 ENCOUNTER — Ambulatory Visit: Payer: Self-pay | Admitting: Neurology

## 2023-11-27 ENCOUNTER — Other Ambulatory Visit (HOSPITAL_COMMUNITY): Payer: Self-pay

## 2023-11-28 ENCOUNTER — Other Ambulatory Visit: Payer: Self-pay

## 2023-11-28 ENCOUNTER — Other Ambulatory Visit (HOSPITAL_COMMUNITY): Payer: Self-pay

## 2023-11-29 ENCOUNTER — Other Ambulatory Visit (HOSPITAL_COMMUNITY): Payer: Self-pay

## 2023-11-29 ENCOUNTER — Other Ambulatory Visit: Payer: Self-pay

## 2023-11-30 ENCOUNTER — Other Ambulatory Visit (HOSPITAL_BASED_OUTPATIENT_CLINIC_OR_DEPARTMENT_OTHER): Payer: Self-pay

## 2023-12-10 ENCOUNTER — Ambulatory Visit (HOSPITAL_BASED_OUTPATIENT_CLINIC_OR_DEPARTMENT_OTHER): Admitting: Pulmonary Disease

## 2023-12-12 ENCOUNTER — Other Ambulatory Visit: Payer: Self-pay

## 2023-12-12 ENCOUNTER — Ambulatory Visit: Admitting: Urgent Care

## 2023-12-14 ENCOUNTER — Other Ambulatory Visit (HOSPITAL_COMMUNITY): Payer: Self-pay

## 2023-12-14 ENCOUNTER — Telehealth: Payer: Self-pay

## 2023-12-14 ENCOUNTER — Other Ambulatory Visit (HOSPITAL_BASED_OUTPATIENT_CLINIC_OR_DEPARTMENT_OTHER): Payer: Self-pay

## 2023-12-14 ENCOUNTER — Other Ambulatory Visit: Payer: Self-pay

## 2023-12-14 MED ORDER — LEVALBUTEROL HCL 0.63 MG/3ML IN NEBU: 0.6300 mg | INHALATION_SOLUTION | Freq: Four times a day (QID) | RESPIRATORY_TRACT | 5 refills | Status: DC | PRN

## 2023-12-14 NOTE — Telephone Encounter (Signed)
*  Pulm  Pharmacy Patient Advocate Encounter   Received notification from CoverMyMeds that prior authorization for Levalbuterol  HCl 0.63MG /3ML nebulizer solution  is required/requested.   Insurance verification completed.   The patient is insured through [facility] .   Per test claim: Medication is not eligible for pharmacy benefits and must be billed through medical insurance. As our team only handles pharmacy related prior auths, medical PA's must be submitted by the clinic. Thank you

## 2023-12-17 ENCOUNTER — Ambulatory Visit (HOSPITAL_BASED_OUTPATIENT_CLINIC_OR_DEPARTMENT_OTHER)
Admission: RE | Admit: 2023-12-17 | Discharge: 2023-12-17 | Disposition: A | Discharge status: Home / Self Care | Source: Ambulatory Visit | Attending: Internal Medicine | Admitting: Internal Medicine

## 2023-12-17 DIAGNOSIS — J449 Chronic obstructive pulmonary disease, unspecified: Secondary | ICD-10-CM | POA: Insufficient documentation

## 2023-12-17 NOTE — Telephone Encounter (Signed)
 Can we check on this?

## 2023-12-19 ENCOUNTER — Encounter: Payer: Self-pay | Admitting: Urgent Care

## 2023-12-19 ENCOUNTER — Ambulatory Visit: Admitting: Urgent Care

## 2023-12-19 VITALS — BP 138/70 | HR 65 | Temp 97.5°F | Wt 143.8 lb

## 2023-12-19 DIAGNOSIS — E1142 Type 2 diabetes mellitus with diabetic polyneuropathy: Secondary | ICD-10-CM | POA: Diagnosis not present

## 2023-12-19 DIAGNOSIS — C4490 Unspecified malignant neoplasm of skin, unspecified: Secondary | ICD-10-CM

## 2023-12-19 DIAGNOSIS — F419 Anxiety disorder, unspecified: Secondary | ICD-10-CM

## 2023-12-19 DIAGNOSIS — R269 Unspecified abnormalities of gait and mobility: Secondary | ICD-10-CM | POA: Diagnosis not present

## 2023-12-19 DIAGNOSIS — I10 Essential (primary) hypertension: Secondary | ICD-10-CM | POA: Diagnosis not present

## 2023-12-19 DIAGNOSIS — L570 Actinic keratosis: Secondary | ICD-10-CM

## 2023-12-19 NOTE — Progress Notes (Signed)
 Established Patient Office Visit  Subjective:  Patient ID: Matthew Hall, male    DOB: Jul 22, 1945  Age: 78 y.o. MRN: [id number]  Chief Complaint  Patient presents with   Medical Management of Chronic Issues    HPI  Discussed the use of AI scribe software for clinical note transcription with the patient, who gave verbal consent to proceed.  History of Present Illness   Matthew Hall is a 78 year old male who presents for follow-up on wound healing and blood pressure management.  He has been experiencing issues with wound healing following Mohs surgery, with difficulty in paper tape adhesion to his skin. He finds this bothersome and has had to reapply bandages himself. Multiple surgeries have been performed, and although stitches from the recent surgery have been removed, he noticed some oozing from a couple of spots, which he attributes to his use of anticoagulants.  He has a history of blood pressure fluctuations, which he monitors at home. His blood pressure readings vary significantly, with a recent reading of 85/unknown while lying flat, later increasing to 104/unknown. He takes medication to manage his blood pressure when it rises to about 150. He has started taking buspirone , one pill twice a day, to help manage stress and anxiety, which he feels contributes to his blood pressure variability. He feels 'a little out of sorts' and experiences nausea, which he manages with medication.  He experiences constipation, which he attributes to his medications. He drinks three to four glasses of water daily and has tried using Dulcolax and Miralax  inconsistently. Dietary measures like peach juice have not been effective.  He has a history of vasculitis and skin issues, with new skin lesions frequently appearing. He is seeking a Ship broker for ongoing skin management, including freezing off lesions. He had a CT scan of his lung recently but has not yet received the results.  He  has a history of sleep apnea and had to cancel a sleep apnea test due to his recent surgery. He is unable to drive due to drop foot and relies on others for transportation.      Patient Active Problem List   Diagnosis Date Noted   Aphasia 10/28/2023   Lung nodule 10/28/2023   Chronic respiratory failure with hypoxia (HCC) 10/25/2023   Gait abnormality 09/24/2023   DM2 (diabetes mellitus, type 2) (HCC) 07/13/2023   Paroxysmal atrial fibrillation (HCC) 07/13/2023   GAD (generalized anxiety disorder) 07/13/2023   Chronic heart failure with mildly reduced ejection fraction (HFmrEF, 41-49%) (HCC) 07/13/2023   Malnutrition of moderate degree 07/13/2023   Pain due to onychomycosis of toenails of both feet 07/02/2023   Paronychia of great toe of left foot 07/02/2023   Weakness of right foot 05/09/2023   Cerebrovascular accident (CVA) (HCC) 04/12/2023   Anxiety 04/12/2023   Diabetic peripheral neuropathy (HCC) 12/27/2020   Paresthesia 12/01/2020   Elevated sed rate 05/21/2017   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) 03/01/2017   Vasculitis (HCC) 02/20/2017   Atrial fibrillation, chronic (HCC) 07/09/2016   BRONCHIECTASIS W/O ACUTE EXACERBATION 03/31/2008   Essential hypertension 09/13/2007   Allergic rhinitis 09/13/2007   EMPHYSEMA 09/13/2007   Past Medical History:  Diagnosis Date   Anxiety    Aortic atherosclerosis (HCC)    Atrial fibrillation (HCC)    Basal cell carcinoma    Bladder cancer (HCC)    Bladder tumor    Chronic systolic heart failure (HCC)    Colon polyps    COPD (chronic obstructive pulmonary disease) (  HCC)    Cutaneous vasculitis    Diabetes (HCC)    Diabetic peripheral neuropathy (HCC) 12/27/2020   Diverticulosis    Essential hypertension    GERD (gastroesophageal reflux disease)    History of pneumonia    Nephrolithiasis    Osteoarthritis    Right foot drop    Stroke [facility])    Past Surgical History:  Procedure Laterality Date   BRONCHIAL WASHINGS  06/19/2023    Procedure: BRONCHIAL WASHINGS;  Surgeon: Joesph Mussel, DO;  Location: [facility];  Service: Cardiopulmonary;;   COLONOSCOPY     Kidney stones removed  1996   SKIN CANCER EXCISION Left    2003   TRANSURETHRAL RESECTION OF BLADDER TUMOR  1996   VIDEO BRONCHOSCOPY N/A 06/19/2023   Procedure: VIDEO BRONCHOSCOPY WITHOUT FLUORO;  Surgeon: Joesph Mussel, DO;  Location: [facility];  Service: Cardiopulmonary;  Laterality: N/A;   Social History   Tobacco Use   Smoking status: Former    Current packs/day: 0.00    Average packs/day: 1 pack/day for 30.0 years (30.0 ttl pk-yrs)    Types: Cigarettes    Start date: 07/06/1981    Quit date: 07/07/2011    Years since quitting: 12.4   Smokeless tobacco: Never  Vaping Use   Vaping status: Never Used  Substance Use Topics   Alcohol  use: No    Alcohol /week: 0.0 standard drinks of alcohol    Drug use: No      ROS: as noted in HPI  Objective:     BP 138/70   Pulse 65   Temp (!) 97.5 F (36.4 C)   Wt 143 lb 12.8 oz (65.2 kg)   SpO2 95%   BMI 20.06 kg/m  BP Readings from Last 3 Encounters:  12/19/23 138/70  11/14/23 132/72  10/31/23 (!) 144/80   Wt Readings from Last 3 Encounters:  12/19/23 143 lb 12.8 oz (65.2 kg)  11/14/23 149 lb (67.6 kg)  10/28/23 144 lb 9.6 oz (65.6 kg)      Physical Exam Vitals and nursing note reviewed.  Constitutional:      General: He is not in acute distress.    Appearance: Normal appearance. He is not ill-appearing, toxic-appearing or diaphoretic.  HENT:     Head: Normocephalic and atraumatic.     Right Ear: External ear normal.     Left Ear: External ear normal.     Nose: Nose normal.   Eyes:     General: No scleral icterus.    Pupils: Pupils are equal, round, and reactive to light.    Cardiovascular:     Rate and Rhythm: Normal rate and regular rhythm.  Pulmonary:     Effort: Pulmonary effort is normal. No respiratory distress.     Breath sounds: Normal breath sounds. No stridor.    Musculoskeletal:        General: Deformity (R ankle) present.     Right lower leg: No edema.     Left lower leg: No edema.   Skin:    General: Skin is warm and dry.     Comments: Guaze pad taped to R cheek   Neurological:     General: No focal deficit present.     Mental Status: He is alert and oriented to person, place, and time.     Gait: Gait abnormal (uses rollator).   Psychiatric:        Mood and Affect: Mood normal.        Behavior: Behavior normal.  No results found for any visits on 12/19/23.  Last CBC Lab Results  Component Value Date   WBC 10.0 10/31/2023   HGB 12.8 (L) 10/31/2023   HCT 41.2 10/31/2023   MCV 90.7 10/31/2023   MCH 28.2 10/31/2023   RDW 14.1 10/31/2023   PLT 314 10/31/2023   Last metabolic panel Lab Results  Component Value Date   GLUCOSE 99 10/31/2023   NA 136 10/31/2023   K 3.8 10/31/2023   CL 97 (L) 10/31/2023   CO2 29 10/31/2023   BUN 12 10/31/2023   CREATININE 0.85 10/31/2023   GFRNONAA >60 10/31/2023   CALCIUM  9.3 10/31/2023   PHOS 2.9 08/14/2023   PROT 6.7 10/28/2023   ALBUMIN 3.1 (L) 10/28/2023   LABGLOB 3.3 05/09/2023   LABGLOB 2.8 05/09/2023   BILITOT 1.1 10/28/2023   ALKPHOS 43 10/28/2023   AST 31 10/28/2023   ALT 12 10/28/2023   ANIONGAP 10 10/31/2023   Last lipids Lab Results  Component Value Date   CHOL 118 09/10/2023   HDL 31.40 (L) 09/10/2023   LDLCALC 69 09/10/2023   TRIG 89.0 09/10/2023   CHOLHDL 4 09/10/2023   Last hemoglobin A1c Lab Results  Component Value Date   HGBA1C 6.4 09/10/2023   Last thyroid  functions Lab Results  Component Value Date   TSH 1.36 09/10/2023   Last vitamin D  Lab Results  Component Value Date   VD25OH 81.52 09/10/2023   Last vitamin B12 and Folate Lab Results  Component Value Date   VITAMINB12 >1537 (H) 09/10/2023   FOLATE 8.8 09/10/2023      The ASCVD Risk score (Arnett DK, et al., 2019) failed to calculate for the following reasons:   Risk score  cannot be calculated because patient has a medical history suggesting prior/existing ASCVD  Assessment & Plan:  Anxiety  Essential hypertension  Gait abnormality  Type 2 diabetes mellitus with diabetic polyneuropathy, without long-term current use of insulin  (HCC) -     Hemoglobin A1c -     Basic metabolic panel with GFR  Skin cancer  Actinic keratoses -     Ambulatory referral to Dermatology  Assessment and Plan    Post-Mohs Surgery Wound Care Experiencing difficulty with paper tape adherence. Slight oozing is normal post-surgery. - Continue applying Vaseline to the wound. - Use non-sticky dressing and paper tape as needed.  Skin Lesions Seeking local dermatologist for ongoing management and cryotherapy for actinic keratosis. - Place referral to a local dermatologist.  Blood Pressure Variability Reports fluctuating blood pressure, possibly influenced by buspirone . In-office reading was normal. - Continue home blood pressure monitoring. - Take medication if systolic exceeds 811 mmHg.  Diabetes Mellitus Type 2 Last HbA1c was 6.4%, indicating good control. Due for repeat test. - Order HbA1c test today.  Constipation Likely due to medication and low water intake. Inconsistent Miralax  use. - Increase water intake. - Use Miralax  1-2 capfuls daily for 2-3 weeks. - Consider Dulcolax if Miralax  ineffective. - Try prune or pineapple juice.  Follow-up Awaiting lung CT scan results. Issues accessing MyChart. - Send HbA1c results via MyChart and follow up with a call if no response. - Follow up on CT scan results in 3-5 days.       Follow up in 3 months, sooner as needed   Mandy Second, [place]

## 2023-12-19 NOTE — Telephone Encounter (Signed)
 I called [facility] and verified that the medication had to be ordered and the patient is aware.  This pharmacy cannot bill through [facility], however, they billed his express scripts and it is less than $3.  Nothing further needed.

## 2023-12-19 NOTE — Patient Instructions (Signed)
 Continue all medications as ordered. We drew your labs today; will notify you via mychart with the results.  Please return for follow up and recheck in 3 months, sooner as needed. It will be at the new office location: [facility] [address], [place], [place] [address] Phone: [phone]

## 2023-12-20 ENCOUNTER — Encounter (HOSPITAL_BASED_OUTPATIENT_CLINIC_OR_DEPARTMENT_OTHER): Admitting: Pulmonary Disease

## 2023-12-20 LAB — BASIC METABOLIC PANEL WITH GFR
BUN/Creatinine Ratio: 19 (ref 10–24)
BUN: 19 mg/dL (ref 8–27)
CO2: 24 mmol/L (ref 20–29)
Calcium: 9.7 mg/dL (ref 8.6–10.2)
Chloride: 99 mmol/L (ref 96–106)
Creatinine, Ser: 0.99 mg/dL (ref 0.76–1.27)
Glucose: 89 mg/dL (ref 70–99)
Potassium: 4.3 mmol/L (ref 3.5–5.2)
Sodium: 142 mmol/L (ref 134–144)
eGFR: 77 mL/min/{1.73_m2} (ref >59)

## 2023-12-20 LAB — HEMOGLOBIN A1C
Est. average glucose Bld gHb Est-mCnc: 131 mg/dL
Hgb A1c MFr Bld: 6.2 % — ABNORMAL HIGH (ref 4.8–5.6)

## 2023-12-21 ENCOUNTER — Other Ambulatory Visit (HOSPITAL_COMMUNITY): Payer: Self-pay

## 2023-12-21 ENCOUNTER — Other Ambulatory Visit: Payer: Self-pay

## 2023-12-21 ENCOUNTER — Other Ambulatory Visit (HOSPITAL_BASED_OUTPATIENT_CLINIC_OR_DEPARTMENT_OTHER): Payer: Self-pay

## 2023-12-21 MED ORDER — ONDANSETRON HCL 4 MG PO TABS
4.0000 mg | ORAL_TABLET | Freq: Three times a day (TID) | ORAL | 1 refills | Status: DC | PRN
  Filled 2023-12-21: qty 30, 10d supply, fill #0
  Filled 2024-04-18: qty 30, 10d supply, fill #1

## 2023-12-22 ENCOUNTER — Ambulatory Visit: Payer: Self-pay | Admitting: Urgent Care

## 2023-12-22 ENCOUNTER — Encounter (HOSPITAL_BASED_OUTPATIENT_CLINIC_OR_DEPARTMENT_OTHER): Payer: Self-pay

## 2023-12-22 DIAGNOSIS — F419 Anxiety disorder, unspecified: Secondary | ICD-10-CM

## 2023-12-24 ENCOUNTER — Other Ambulatory Visit: Payer: Self-pay

## 2023-12-24 NOTE — Telephone Encounter (Signed)
**Note De-identified  [place] Obfuscation** Please advise 

## 2023-12-25 ENCOUNTER — Other Ambulatory Visit: Payer: Self-pay

## 2023-12-31 ENCOUNTER — Other Ambulatory Visit: Payer: Self-pay

## 2024-01-02 ENCOUNTER — Other Ambulatory Visit (HOSPITAL_COMMUNITY): Payer: Self-pay

## 2024-01-02 ENCOUNTER — Other Ambulatory Visit (HOSPITAL_BASED_OUTPATIENT_CLINIC_OR_DEPARTMENT_OTHER): Payer: Self-pay

## 2024-01-02 MED ORDER — ALPRAZOLAM 0.25 MG PO TABS
0.2500 mg | ORAL_TABLET | Freq: Two times a day (BID) | ORAL | 0 refills | Status: DC | PRN
  Filled 2024-01-02: qty 90, 45d supply, fill #0

## 2024-01-02 NOTE — Telephone Encounter (Signed)
 No further action needed.

## 2024-01-07 ENCOUNTER — Telehealth: Payer: Self-pay | Admitting: Neurology

## 2024-01-07 NOTE — Telephone Encounter (Signed)
 MYC conf

## 2024-01-07 NOTE — Telephone Encounter (Signed)
 noted

## 2024-01-07 NOTE — Telephone Encounter (Signed)
 Patient's daughter, Rosina notify patient is having GI issues and may need to cancel appt for tomorrow.  Will call back before 5 pm today.

## 2024-01-08 ENCOUNTER — Ambulatory Visit: Admitting: Neurology

## 2024-01-08 ENCOUNTER — Encounter: Payer: Self-pay | Admitting: Neurology

## 2024-01-08 VITALS — BP 139/68 | HR 72 | Resp 14

## 2024-01-08 DIAGNOSIS — R29898 Other symptoms and signs involving the musculoskeletal system: Secondary | ICD-10-CM | POA: Diagnosis not present

## 2024-01-08 DIAGNOSIS — I639 Cerebral infarction, unspecified: Secondary | ICD-10-CM | POA: Diagnosis not present

## 2024-01-08 DIAGNOSIS — R4189 Other symptoms and signs involving cognitive functions and awareness: Secondary | ICD-10-CM | POA: Diagnosis not present

## 2024-01-08 DIAGNOSIS — R269 Unspecified abnormalities of gait and mobility: Secondary | ICD-10-CM

## 2024-01-08 NOTE — Progress Notes (Addendum)
 ASSESSMENT AND PLAN  Matthew Hall is a 78 y.o. male   Mild cognitive impairment   MOCA 25/30  Laboratory evaluation showed no treatable etiology  Patient deny a family history of dementia, discussed with patient, he would like to proceed with Alzheimer's specific profile,  His transient confusion, mental slowing likely due to aging, polypharmacy, stress of medical illness, taking care of of his wife,  Subacute onset of right foot drop, History of vasculitis (skin lesions) with recent flareup since August 2024 History of chronic low back pain, radiating pain to right lower extremity  EMG nerve conduction study confirmed moderate severe axonal sensorimotor polyneuropathy, also findings suggestive of active right sciatic neuropathy versus bilateral lumbosacral radiculopathy, right worse than left,  MRI of lumbar spine in May 2025 showed multilevel degenerative changes, no significant canal foraminal narrowing,  History right foot weakness remained, some improvement of gait difficulty wearing right AFO  Encouraging moderate exercise   DIAGNOSTIC DATA (LABS, IMAGING, TESTING) - I reviewed patient records, labs, notes, testing and imaging myself where available.   MEDICAL HISTORY:  Matthew Hall, is a 78 year old male seen in request by Dr. Jerri for electrodiagnostic study to evaluate his subacute onset right lower extremity weakness, gait abnormality, his primary care is from   BRYNA [name], MD   History is obtained from the patient and review of electronic medical records. I personally reviewed pertinent available imaging films in PACS.   PMHx of  Anxiety, chronic insomnia Vasculitis, under the care of rheumatologist, on prednisone  COPD, Diabetes Stroke  Around 2014, he developed rash from head to toe, skin biopsy confirmed vasculitis, was treated with doxycycline , and prednisone , rash gradually disappeared in 1 month  He also had baseline chronic low back  pain, radiating pain to right lower extremity, but not actively  He is the main caregiver of his wife, who suffered multiple medical disease, at baseline he is very active, trimming his trees, cutting bushes, around 2022, he developed numbness tingling at feet, starting at the right side, later began to involving left foot, sensitive, occasionally shooting pain,  He was seen by Dr. Jenel in May 2022, EMG nerve conduction study in July 2022 confirmed moderate axonal sensorimotor polyneuropathy, but lost follow-up since  In August 2024, he began to have flareups of rash again, this time contained to bilateral foot, he was under the care of local rheumatologist Dr. Valeen Plain, was treated with low-dose of doxycycline  and prednisone , rash began to improve  Hospital admission in October 2024, presented to hospital for 2 days of worsening right foot numbness, also began to noticed profound right foot weakness, develop subacute onset gait abnormality  Hospital evaluation showed positive DWI lesions on MRI of the brain, consistent with small vessel stroke, there is punctuated lesion involving right temporal, basal ganglion, left frontal white matter, in the background of 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, MRI of the brain showed evidence of intracranial atherosclerotic disease  He did have a history of atrial fibrillation, taking Coumadin , was switched to Eliquis   He continues to have significant right distal leg weakness, gait abnormality, began to rely on a walker since, there was no significant improvement  UPDATE September 24 2023: Multiple hospital admission since last visit in October 2024,  December 2024, admitted for 1 months of progressive worsening shortness of breath, cough with yellow sputum, multifocal pneumonia, with underlying COPD, bronchiectasis, sepsis present on admission, leukocytosis, low-grade fever, tachypnea, tachycardia, improved with chest PT,  spirometry, breathing  exercise, prolonged multiple antibiotic,  Hospital admission again in January 2025 for COPD with acute exacerbation, with acute hypoxic respiratory failure, received oxygen , IV Solu-Medrol ,  Readmission February 2025 COPD with acute exacerbation, chronic systolic congestive heart failure ejection fraction of 40 to 45%,  He was discharged to [facility], wife recently had hospital admission, will be discharged to the same facility independent living, he was brought in by transportation today, could no longer drive due to his dense right foot numbness, weakness, now wear right ankle brace, rely on his walker, denies significant low back pain, denied bowel and bladder incontinence, he did not have MRI of the lumbar spine that was ordered few months ago  UPDATE January 08 2024: Right foot still weak, neuropathy, burns a lot at both feet,  He is referred by PA Lowella Folks  in April for memory loss,confusion,  he worked at [employer], trouble shooting machinery, retired at age 34,  he keeps up his house and 20 aces land. Himself denied significant cognitive impairment, MOCA 25/30.  On the questionnaire of activity of daily living, he is able to bathing dressing grooming toileting eating transferring shopping cooking manage medication housework laundry, do needs transportation, need help managing his finances, denies difficulty communicating  MRI brain on October 28 2023 1. No evidence of acute intracranial abnormality. 2. Remote lacunar infarcts and chronic microvascular ischemic disease.   EEG was normal on October 29 2023. He lives at [facility] independent Living, no longer smoke, walk with walker, helps his wife who suffered significant illness, decubitus ulcer.   Lab in 2025, normal BMP, A1C 6.2,  Hg 12.8, B12, folic acid , TSH, VitD   PHYSICAL EXAM:       01/08/2024    9:29 AM 12/19/2023    9:39 AM 11/14/2023   10:32 AM  Vitals with BMI  Weight  143 lbs 13 oz    Systolic 139 138 867  Diastolic 68 70 72  Pulse 72 65      PHYSICAL EXAMNIATION:  Gen: NAD, conversant, well nourised, well groomed                     Cardiovascular: Regular rate rhythm, no peripheral edema, warm, nontender. Eyes: Conjunctivae clear without exudates or hemorrhage Neck: Supple, no carotid bruits. Pulmonary: Clear to auscultation bilaterally   NEUROLOGICAL EXAM:  MENTAL STATUS: Speech/cognition: Awake, alert, oriented to history taking and casual conversation    01/08/2024    9:35 AM  Montreal Cognitive Assessment   Visuospatial/ Executive (0/5) 4  Naming (0/3) 2  Attention: Read list of digits (0/2) 2  Attention: Read list of letters (0/1) 1  Attention: Serial 7 subtraction starting at 100 (0/3) 3  Language: Repeat phrase (0/2) 1  Language : Fluency (0/1) 1  Abstraction (0/2) 2  Delayed Recall (0/5) 3  Orientation (0/6) 6  Total 25    CRANIAL NERVES: CN II: Visual fields are full to confrontation. Pupils are round equal and briskly reactive to light. CN III, IV, VI: extraocular movement are normal. No ptosis. CN V: Facial sensation is intact to light touch CN VII: Face is symmetric with normal eye closure  CN VIII: Hearing is normal to causal conversation. CN IX, X: Phonation is normal. CN XI: Head turning and shoulder shrug are intact  MOTOR: No significant bilateral upper extremity proximal and distal muscle weakness  No significant lower extremity proximal muscle weakness, bilateral ankle dorsiflexion weakness, moderate on right, wear  right ankle brace  REFLEXES:   absent at lower extremity  SENSORY: Length-dependent decreased light touch, pinprick, vibratory sensation, most noticeable at right lower extremity to below knee level  COORDINATION: There is no trunk or limb dysmetria noted.  GAIT/STANCE: Need push-up to get up from seated position, rely on his walker, dropfoot more obvious on right side  REVIEW OF SYSTEMS:  Full 14 system  review of systems performed and notable only for as above All other review of systems were negative.   ALLERGIES: Allergies  Allergen Reactions   Simvastatin Rash   Cortisone Nausea And Vomiting   Albuterol  Palpitations and Other (See Comments)    Pt is okay to take xopenex , albuterol  raises his heart rate   Augmentin [Amoxicillin-Pot Clavulanate] Rash   Ciprofloxacin Rash   Contrast Media [Iodinated Contrast Media] Rash   Flagyl  [Metronidazole ] Rash   Latex Hives and Rash   Sulfa  Antibiotics Rash    HOME MEDICATIONS: Current Outpatient Medications  Medication Sig Dispense Refill   Acetaminophen  (TYLENOL ) 325 MG CAPS Take 1-2 capsules by mouth every 6 (six) hours as needed (pain).     ALPRAZolam  (XANAX ) 0.25 MG tablet Take 1 tablet (0.25 mg total) by mouth 2 (two) times daily as needed for anxiety or sleep. 90 tablet 0   apixaban  (ELIQUIS ) 5 MG TABS tablet Take 1 tablet (5 mg total) by mouth 2 (two) times daily. 60 tablet 3   budesonide -glycopyrrolate -formoterol  (BREZTRI  AEROSPHERE) 160-9-4.8 MCG/ACT AERO inhaler Inhale 2 puffs into the lungs 2 (two) times daily. 10.7 g 11   busPIRone  (BUSPAR ) 10 MG tablet Take 1 tablet (10 mg total) by mouth 2 (two) times daily. 180 tablet 1   Cyanocobalamin  (VITAMIN B12 PO) Take 1 tablet by mouth daily.     feeding supplement (ENSURE ENLIVE / ENSURE PLUS) LIQD Take 237 mLs by mouth 2 (two) times daily between meals.     guaiFENesin  (MUCINEX ) 600 MG 12 hr tablet Take 1 tablet (600 mg total) by mouth 2 (two) times daily. 60 tablet 5   hydrochlorothiazide  (HYDRODIURIL ) 12.5 MG tablet Take 1 tablet (12.5 mg total) by mouth as needed. 30 tablet 3   levalbuterol  (XOPENEX  HFA) 45 MCG/ACT inhaler Inhale 2 puffs into the lungs every 6 (six) hours as needed for wheezing. 15 g 3   levalbuterol  (XOPENEX ) 0.63 MG/3ML nebulizer solution Take 3 mLs (0.63 mg total) by nebulization every 6 (six) hours as needed for wheezing or shortness of breath. 270 mL 5    metFORMIN  (GLUCOPHAGE -XR) 500 MG 24 hr tablet Take 1 tablet (500 mg total) by mouth daily. 90 tablet 3   metoprolol  succinate (TOPROL -XL) 25 MG 24 hr tablet Take 1 tablet (25 mg total) by mouth 2 (two) times daily. 180 tablet 0   omeprazole  (PRILOSEC) 20 MG capsule Take 1 capsule (20 mg total) by mouth daily. 90 capsule 2   ondansetron  (ZOFRAN ) 4 MG tablet Take 1 tablet (4 mg total) by mouth 3 (three) times daily as needed for nausea/vomiting. 30 tablet 1   triamcinolone  (NASACORT ) 55 MCG/ACT AERO nasal inhaler Place 1 spray into the nose daily. 16.9 mL 5   No current facility-administered medications for this visit.    PAST MEDICAL HISTORY: Past Medical History:  Diagnosis Date   Anxiety    Aortic atherosclerosis (HCC)    Atrial fibrillation (HCC)    Basal cell carcinoma    Bladder cancer (HCC)    Bladder tumor    Chronic systolic heart failure (HCC)  Colon polyps    COPD (chronic obstructive pulmonary disease) (HCC)    Cutaneous vasculitis    Diabetes (HCC)    Diabetic peripheral neuropathy (HCC) 12/27/2020   Diverticulosis    Essential hypertension    GERD (gastroesophageal reflux disease)    History of pneumonia    Nephrolithiasis    Osteoarthritis    Right foot drop    Stroke [facility])     PAST SURGICAL HISTORY: Past Surgical History:  Procedure Laterality Date   BRONCHIAL WASHINGS  06/19/2023   Procedure: BRONCHIAL WASHINGS;  Surgeon: Gretta Leita SQUIBB, DO;  Location: [facility];  Service: Cardiopulmonary;;   COLONOSCOPY     Kidney stones removed  1996   SKIN CANCER EXCISION Left    2003   TRANSURETHRAL RESECTION OF BLADDER TUMOR  1996   VIDEO BRONCHOSCOPY N/A 06/19/2023   Procedure: VIDEO BRONCHOSCOPY WITHOUT FLUORO;  Surgeon: Gretta Leita SQUIBB, DO;  Location: [facility];  Service: Cardiopulmonary;  Laterality: N/A;    FAMILY HISTORY: Family History  Problem Relation Age of Onset   Diabetes Mother    CAD Mother    Emphysema Father    Diabetes Sister    Stroke  Sister    Lung cancer Brother    Liver cancer Brother    Colon cancer Neg Hx     SOCIAL HISTORY: Social History   Socioeconomic History   Marital status: Married    Spouse name: Not on file   Number of children: 3   Years of education: Not on file   Highest education level: Not on file  Occupational History   Occupation: Pipefilter/retired    Comment: [employer]  Tobacco Use   Smoking status: Former    Current packs/day: 0.00    Average packs/day: 1 pack/day for 30.0 years (30.0 ttl pk-yrs)    Types: Cigarettes    Start date: 07/06/1981    Quit date: 07/07/2011    Years since quitting: 12.5   Smokeless tobacco: Never  Vaping Use   Vaping status: Never Used  Substance and Sexual Activity   Alcohol  use: No    Alcohol /week: 0.0 standard drinks of alcohol    Drug use: No   Sexual activity: Not Currently  Other Topics Concern   Not on file  Social History Narrative   Lives with wife   Caffeine- 1 cup a day   Right handed    Social Drivers of Health   Financial Resource Strain: Not on file  Food Insecurity: No Food Insecurity (10/29/2023)   Hunger Vital Sign    Worried About Running Out of Food in the Last Year: Never true    Ran Out of Food in the Last Year: Never true  Transportation Needs: No Transportation Needs (10/29/2023)   PRAPARE - Administrator, Civil Service (Medical): No    Lack of Transportation (Non-Medical): No  Physical Activity: Not on file  Stress: Not on file  Social Connections: Moderately Isolated (10/29/2023)   Social Connection and Isolation Panel    Frequency of Communication with Friends and Family: More than three times a week    Frequency of Social Gatherings with Friends and Family: Once a week    Attends Religious Services: Never    Database administrator or Organizations: No    Attends Banker Meetings: Never    Marital Status: Married  Catering manager Violence: Not At Risk (10/29/2023)   Humiliation, Afraid,  Rape, and Kick questionnaire    Fear of Current or  Ex-Partner: No    Emotionally Abused: No    Physically Abused: No    Sexually Abused: No      Modena Callander, M.D. Ph.D.  [facility] [address] [place], [place] [address] Ph: [phone] Fax: [phone]  CC:  Lowella Benton [place], [place] [address] [place],  [place] [address]  Lowella Benton [place], [place]

## 2024-01-16 ENCOUNTER — Other Ambulatory Visit: Payer: Self-pay

## 2024-01-16 ENCOUNTER — Other Ambulatory Visit: Payer: Self-pay | Admitting: Cardiology

## 2024-01-17 ENCOUNTER — Other Ambulatory Visit: Payer: Self-pay

## 2024-01-17 ENCOUNTER — Other Ambulatory Visit (HOSPITAL_COMMUNITY): Payer: Self-pay

## 2024-01-17 MED ORDER — METOPROLOL SUCCINATE ER 25 MG PO TB24
25.0000 mg | ORAL_TABLET | Freq: Two times a day (BID) | ORAL | 2 refills | Status: AC
  Filled 2024-01-17 – 2024-01-30 (×2): qty 60, 30d supply, fill #0
  Filled 2024-02-21 – 2024-02-26 (×2): qty 60, 30d supply, fill #1
  Filled 2024-03-24: qty 60, 30d supply, fill #2
  Filled 2024-04-21 – 2024-04-28 (×2): qty 60, 30d supply, fill #3
  Filled 2024-05-26: qty 60, 30d supply, fill #4
  Filled 2024-06-20: qty 60, 30d supply, fill #5
  Filled 2024-07-22: qty 60, 30d supply, fill #6
  Filled ????-??-??: fill #0

## 2024-01-18 ENCOUNTER — Other Ambulatory Visit: Payer: Self-pay

## 2024-01-18 ENCOUNTER — Other Ambulatory Visit (HOSPITAL_COMMUNITY): Payer: Self-pay

## 2024-01-18 LAB — ATN PROFILE
A -- Beta-amyloid 42/40 Ratio: 0.117 (ref >0.102)
Beta-amyloid 40: 193.87 pg/mL
Beta-amyloid 42: 22.59 pg/mL
N -- NfL, Plasma: 3.83 pg/mL (ref 0.00–6.04)
T -- p-tau181: 1.18 pg/mL — ABNORMAL HIGH (ref 0.00–0.97)

## 2024-01-18 LAB — APOE ALZHEIMER'S RISK

## 2024-01-21 ENCOUNTER — Ambulatory Visit (HOSPITAL_BASED_OUTPATIENT_CLINIC_OR_DEPARTMENT_OTHER): Admitting: Pulmonary Disease

## 2024-01-21 ENCOUNTER — Institutional Professional Consult (permissible substitution): Admitting: Neurology

## 2024-01-21 ENCOUNTER — Encounter (HOSPITAL_BASED_OUTPATIENT_CLINIC_OR_DEPARTMENT_OTHER): Payer: Self-pay | Admitting: Pulmonary Disease

## 2024-01-21 ENCOUNTER — Other Ambulatory Visit: Payer: Self-pay

## 2024-01-21 ENCOUNTER — Ambulatory Visit: Payer: Self-pay | Admitting: Neurology

## 2024-01-21 VITALS — BP 141/69 | HR 63 | Ht 71.0 in | Wt 143.0 lb

## 2024-01-21 DIAGNOSIS — J449 Chronic obstructive pulmonary disease, unspecified: Secondary | ICD-10-CM | POA: Diagnosis not present

## 2024-01-21 DIAGNOSIS — J9611 Chronic respiratory failure with hypoxia: Secondary | ICD-10-CM | POA: Diagnosis not present

## 2024-01-21 DIAGNOSIS — R911 Solitary pulmonary nodule: Secondary | ICD-10-CM | POA: Diagnosis not present

## 2024-01-21 DIAGNOSIS — Z87891 Personal history of nicotine dependence: Secondary | ICD-10-CM

## 2024-01-21 DIAGNOSIS — J479 Bronchiectasis, uncomplicated: Secondary | ICD-10-CM

## 2024-01-21 NOTE — Patient Instructions (Signed)
 Stay on breztri   Use flutter valve twice daily New nodule is very tiny, repeat Ct chest wo con in  6months

## 2024-01-21 NOTE — Progress Notes (Signed)
 Subjective:    Patient ID: Matthew Hall, male    DOB: Aug 10, 1945, 78 y.o.   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   PMH - diabetes-2,  HFrEF 40-45%  CVA 04/2023 -MRI >> punctate acute infarcts in the right temporal lobe, right basal ganglia and left frontal white matter- Foot Drop   bladder cancer  Lymphocytic  Vasculitis - follows with rheum > rash, chronic steroids Albuterol  caused increased heart rate, Xopenex  nebs works better     admitted early December with multifocal pneumonia, COPD and bronchiectatic exacerbation.  Chest x-ray showed multifocal pneumonia with multifocal patchy opacities. CT chest June 17, 2023 showed bronchiectasis with mucoid impaction and extensive peribronchovascular nodularity.  Underwent bronchoscopy with BAL June 19, 2023.  Culture showed Stenotrophomonas maltophilia .  He was treated with aggressive antibiotic therapy  with a 2-week course of minocycline .  AFB cultures were negative.  Fungal cultures were negative.    admitted 07/2023  for COPD exacerbation with acute respiratory failure. Viral panel was negative. Chest x-ray showed decreased bilateral infiltrates. Blood cultures were negative. Treated with antibiotics and steroids. Discharged on prednisone  taper and doxycycline    10/2023 OV >> Walk test does >> drops down into 87 to 88% on room air.   3 month follow-up.  His breathing is stable, and he seldom uses his oxygen , keeping it on hand for hot and humid weather. He experiences phlegm production when coughing due to drainage tickling his throat.  He uses Breztri , taking a couple of puffs in the morning, and sometimes uses Nasacort  for nasal congestion.  He has plenty of refills for his medications. He uses a flutter valve three to four times a day, performing ten repetitions each time, which he finds challenging but manageable.  He has had pneumonia twice and flu once, with subsequent rehabilitation. Continuous drainage has been present since COVID, which he feels has worsened. He has tried medications like Allegra, which seem to lose effectiveness over time.  A new nodule was found on a recent scan. He recalls past experiences with nodules that resolved without issue. He has a history of aspirating food, which previously led to a lung wash and culture due to infection concerns.  Ambulates with walker, not driving yet  Significant tests/ events reviewed    12/2023 CT chest wo con >> No progression in the bronchiectasis and mucoid impactions New 5 mm left upper lobe nodule.  Stable right lower lobe superior segment nodules measuring 6 mm and 8 mm    CT chest 06/2023  Bronchiectasis with mucoid impaction and extensive peribronchovascular nodularity - stable HRCT chest 09/2022 >> Extensive peribronchovascular nodularity and nodular consolidation, bronchiectasis and mucoid impaction bilaterally.  CT abdomen from 2019 shows right lower lobe bronchiectasis   CT chest 2009 showed >> RLL & lingular bronchectasis & areas of pleuro-parenchymal scarring. 8 mm low density lesion in liver >> stable on rpt CT 01/2008. Incidentally, not seen on CT in 1995.   FEV1 trend '95 59% >> 64% in 09/2007 >> 59% in 03/2008     09/2014  FEv1 40%, ratio 62 07/2015 FEV1 40%   PFTs 10/2022 severe airway obstruction, ratio 47, FEV1 33%,  TLC normal, DLCO 50%   Anti-trypsin nml   Review of Systems neg for any significant sore throat, dysphagia, itching, sneezing, nasal congestion or excess/ purulent secretions, fever, chills, sweats, unintended wt loss, pleuritic or exertional cp, hempoptysis, orthopnea pnd or change in chronic leg swelling. Also denies presyncope,  palpitations, heartburn, abdominal pain, nausea, vomiting, diarrhea or change in bowel or urinary habits, dysuria,hematuria, rash, arthralgias, visual complaints, headache, numbness weakness or ataxia.     Objective:   Physical Exam  Gen. Pleasant, well-nourished, in no distress ENT - no thrush, no pallor/icterus,no post nasal drip Neck: No JVD, no thyromegaly, no carotid bruits Lungs: no use of accessory muscles, no dullness to percussion, clear without rales or rhonchi  Cardiovascular: Rhythm regular, heart sounds  normal, no murmurs or gallops, no peripheral edema Musculoskeletal: No deformities, no cyanosis or clubbing        Assessment & Plan:   Chronic respiratory failure with hypoxia Oxygen  saturation levels are consistently 94-95%. He seldom uses supplemental oxygen , primarily during hot and humid weather. - Allow retention of portable oxygen  concentrator for use as needed. - Encourage longer periods off oxygen . - Reassess oxygen  needs in six months.  Chronic obstructive pulmonary disease (COPD) COPD is well-controlled with Breztri  and minimal coughing. The condition is stable without major recent flare-ups. - Continue Breztri  as prescribed.  Bronchiectasis Bronchiectasis is present with ongoing symptoms, including occasional phlegm production likely due to post-nasal drainage. - Continue Breztri  as prescribed. - Continue Nasacort  as needed for nasal congestion. - Encourage use of flutter valve three to four times daily with ten repetitions each time.  Pulmonary nodule A new tiny pulmonary nodule noted on recent scan, likely benign. Previous nodules have resolved spontaneously. Too small for biopsy. - Order follow-up CT scan in six months to monitor the nodule.

## 2024-01-25 ENCOUNTER — Ambulatory Visit: Admitting: Urgent Care

## 2024-01-30 ENCOUNTER — Other Ambulatory Visit: Payer: Self-pay

## 2024-01-30 ENCOUNTER — Other Ambulatory Visit (HOSPITAL_BASED_OUTPATIENT_CLINIC_OR_DEPARTMENT_OTHER): Payer: Self-pay

## 2024-01-31 ENCOUNTER — Other Ambulatory Visit: Payer: Self-pay

## 2024-02-06 ENCOUNTER — Other Ambulatory Visit: Payer: Self-pay

## 2024-02-08 ENCOUNTER — Other Ambulatory Visit (HOSPITAL_BASED_OUTPATIENT_CLINIC_OR_DEPARTMENT_OTHER): Payer: Self-pay

## 2024-02-08 ENCOUNTER — Other Ambulatory Visit: Payer: Self-pay | Admitting: Family Medicine

## 2024-02-08 MED ORDER — ALPRAZOLAM 0.25 MG PO TABS
0.2500 mg | ORAL_TABLET | Freq: Two times a day (BID) | ORAL | 0 refills | Status: DC | PRN
  Filled 2024-02-08 – 2024-04-18 (×2): qty 90, 45d supply, fill #0

## 2024-02-08 NOTE — Telephone Encounter (Signed)
 Rx request for alpraz refill received. Chart reviewed. He is transferring care from [facility] to me, first appt 03/19/24. #90 tabs sent today, no RF.

## 2024-02-14 ENCOUNTER — Other Ambulatory Visit: Payer: Self-pay

## 2024-02-14 ENCOUNTER — Other Ambulatory Visit: Payer: Self-pay | Admitting: Family Medicine

## 2024-02-14 ENCOUNTER — Other Ambulatory Visit (HOSPITAL_COMMUNITY): Payer: Self-pay

## 2024-02-14 ENCOUNTER — Other Ambulatory Visit (HOSPITAL_BASED_OUTPATIENT_CLINIC_OR_DEPARTMENT_OTHER): Payer: Self-pay

## 2024-02-14 MED FILL — Metformin HCl Tab ER 24HR 500 MG: ORAL | 90 days supply | Qty: 90 | Fill #0 | Status: CN

## 2024-02-14 NOTE — Telephone Encounter (Signed)
 Refill requested for Metformin . Pt is not scheduled with Dr [name] until Sept.

## 2024-02-15 ENCOUNTER — Other Ambulatory Visit: Payer: Self-pay

## 2024-02-15 ENCOUNTER — Other Ambulatory Visit (HOSPITAL_COMMUNITY): Payer: Self-pay

## 2024-02-16 ENCOUNTER — Other Ambulatory Visit (HOSPITAL_COMMUNITY): Payer: Self-pay

## 2024-02-18 ENCOUNTER — Other Ambulatory Visit: Payer: Self-pay

## 2024-02-21 ENCOUNTER — Other Ambulatory Visit (HOSPITAL_COMMUNITY): Payer: Self-pay

## 2024-02-21 ENCOUNTER — Other Ambulatory Visit: Payer: Self-pay

## 2024-02-21 MED FILL — Metformin HCl Tab ER 24HR 500 MG: ORAL | 30 days supply | Qty: 30 | Fill #0 | Status: CN

## 2024-02-22 ENCOUNTER — Other Ambulatory Visit: Payer: Self-pay

## 2024-02-25 ENCOUNTER — Other Ambulatory Visit (HOSPITAL_COMMUNITY): Payer: Self-pay

## 2024-02-26 ENCOUNTER — Other Ambulatory Visit: Payer: Self-pay

## 2024-02-26 ENCOUNTER — Other Ambulatory Visit (HOSPITAL_COMMUNITY): Payer: Self-pay

## 2024-02-26 MED FILL — Metformin HCl Tab ER 24HR 500 MG: ORAL | 30 days supply | Qty: 30 | Fill #0 | Status: AC

## 2024-02-27 ENCOUNTER — Other Ambulatory Visit: Payer: Self-pay

## 2024-02-28 ENCOUNTER — Other Ambulatory Visit (HOSPITAL_COMMUNITY): Payer: Self-pay

## 2024-02-28 ENCOUNTER — Other Ambulatory Visit: Payer: Self-pay

## 2024-02-29 ENCOUNTER — Other Ambulatory Visit: Payer: Self-pay

## 2024-03-03 ENCOUNTER — Ambulatory Visit (INDEPENDENT_AMBULATORY_CARE_PROVIDER_SITE_OTHER): Admitting: Physician Assistant

## 2024-03-03 ENCOUNTER — Encounter: Payer: Self-pay | Admitting: Physician Assistant

## 2024-03-03 VITALS — BP 122/76

## 2024-03-03 DIAGNOSIS — L578 Other skin changes due to chronic exposure to nonionizing radiation: Secondary | ICD-10-CM | POA: Diagnosis not present

## 2024-03-03 DIAGNOSIS — D0439 Carcinoma in situ of skin of other parts of face: Secondary | ICD-10-CM | POA: Diagnosis not present

## 2024-03-03 DIAGNOSIS — Z85828 Personal history of other malignant neoplasm of skin: Secondary | ICD-10-CM | POA: Diagnosis not present

## 2024-03-03 DIAGNOSIS — C4492 Squamous cell carcinoma of skin, unspecified: Secondary | ICD-10-CM

## 2024-03-03 DIAGNOSIS — D492 Neoplasm of unspecified behavior of bone, soft tissue, and skin: Secondary | ICD-10-CM | POA: Diagnosis not present

## 2024-03-03 DIAGNOSIS — D0462 Carcinoma in situ of skin of left upper limb, including shoulder: Secondary | ICD-10-CM

## 2024-03-03 DIAGNOSIS — L570 Actinic keratosis: Secondary | ICD-10-CM | POA: Diagnosis not present

## 2024-03-03 DIAGNOSIS — D485 Neoplasm of uncertain behavior of skin: Secondary | ICD-10-CM

## 2024-03-03 HISTORY — DX: Squamous cell carcinoma of skin, unspecified: C44.92

## 2024-03-03 NOTE — Patient Instructions (Signed)

## 2024-03-03 NOTE — Progress Notes (Signed)
 New Patient Visit   Subjective  Matthew Hall is a 78 y.o. male NEW PATIENT who presents for the following: Spot of left hand that gets irritated every time he puts his hand in his pocket. He has some spots on his back and face and one on his left lower leg. He has had many BCCs and one SCC. He has had Mohs with Dr Dorn Ahle at [facility]. His previous dermatologist in [place] prescribed Efudex x 5 weeks but he could only use it 2 weeks. He has also had blue light treatment in the past and it changed the color of his eyes.    The following portions of the chart were reviewed this encounter and updated as appropriate: medications, allergies, medical history  Review of Systems:  No other skin or systemic complaints except as noted in HPI or Assessment and Plan.  Objective  Well appearing patient in no apparent distress; mood and affect are within normal limits.   A focused examination was performed of the following areas: scalp, face, neck and hands.    Relevant exam findings are noted in the Assessment and Plan.                Face (8) Erythematous thin papules/macules with gritty scale.  Left cheek 1.8 cm scaly pink plaque Left Zygomatic Area 1.3 cm scaly pink plaque Left Preauricular Area 2.2 cm scaly pink plaque Left Dorsal Hand 1.8 cm hyperkeratotic scaly plaque  Assessment & Plan   ACTINIC SKIN DAMAGE ---- SEVERE  - biopsies pending  - strongly recommend frequent skin exams   HISTORY OF NON-MELANOMA SKIN CANCER - MANY  - no evidence of recurrence  - as above re: frequent skin exams     AK (ACTINIC KERATOSIS) (8) Face (8) Destruction of lesion - Face (8) Complexity: simple   Destruction method: cryotherapy   Informed consent: discussed and consent obtained   Timeout:  patient name, date of birth, surgical site, and procedure verified Lesion destroyed using liquid nitrogen: Yes   Region frozen until ice ball extended beyond lesion: Yes    Outcome: patient tolerated procedure well with no complications   Post-procedure details: wound care instructions given    NEOPLASM OF UNCERTAIN BEHAVIOR OF SKIN (4) Left cheek Skin / nail biopsy Type of biopsy: tangential   Informed consent: discussed and consent obtained   Timeout: patient name, date of birth, surgical site, and procedure verified   Procedure prep:  Patient was prepped and draped in usual sterile fashion Prep type:  Isopropyl alcohol  Anesthesia: the lesion was anesthetized in a standard fashion   Anesthetic:  1% lidocaine  w/ epinephrine 1-100,000 buffered w/ 8.4% NaHCO3 Instrument used: flexible razor blade   Hemostasis achieved with: pressure, aluminum chloride and electrodesiccation   Outcome: patient tolerated procedure well   Post-procedure details: sterile dressing applied and wound care instructions given   Dressing type: bandage and petrolatum    Specimen 1 - Surgical pathology Differential Diagnosis: SCC vs other  Check Margins: No Left Zygomatic Area Skin / nail biopsy Type of biopsy: tangential   Informed consent: discussed and consent obtained   Timeout: patient name, date of birth, surgical site, and procedure verified   Procedure prep:  Patient was prepped and draped in usual sterile fashion Prep type:  Isopropyl alcohol  Anesthesia: the lesion was anesthetized in a standard fashion   Anesthetic:  1% lidocaine  w/ epinephrine 1-100,000 buffered w/ 8.4% NaHCO3 Instrument used: flexible razor blade   Hemostasis achieved with:  pressure, aluminum chloride and electrodesiccation   Outcome: patient tolerated procedure well   Post-procedure details: sterile dressing applied and wound care instructions given   Dressing type: bandage and petrolatum    Specimen 2 - Surgical pathology Differential Diagnosis: SCC vs other   Check Margins: No Left Preauricular Area Skin / nail biopsy Type of biopsy: tangential   Informed consent: discussed and consent  obtained   Timeout: patient name, date of birth, surgical site, and procedure verified   Procedure prep:  Patient was prepped and draped in usual sterile fashion Prep type:  Isopropyl alcohol  Anesthesia: the lesion was anesthetized in a standard fashion   Anesthetic:  1% lidocaine  w/ epinephrine 1-100,000 buffered w/ 8.4% NaHCO3 Instrument used: flexible razor blade   Hemostasis achieved with: pressure, aluminum chloride and electrodesiccation   Outcome: patient tolerated procedure well   Post-procedure details: sterile dressing applied and wound care instructions given   Dressing type: bandage and petrolatum    Specimen 3 - Surgical pathology Differential Diagnosis: SCC vs other   Check Margins: No Left Dorsal Hand Skin / nail biopsy Type of biopsy: tangential   Informed consent: discussed and consent obtained   Timeout: patient name, date of birth, surgical site, and procedure verified   Procedure prep:  Patient was prepped and draped in usual sterile fashion Prep type:  Isopropyl alcohol  Anesthesia: the lesion was anesthetized in a standard fashion   Anesthetic:  1% lidocaine  w/ epinephrine 1-100,000 buffered w/ 8.4% NaHCO3 Instrument used: flexible razor blade   Hemostasis achieved with: pressure, aluminum chloride and electrodesiccation   Outcome: patient tolerated procedure well   Post-procedure details: sterile dressing applied and wound care instructions given   Dressing type: bandage and petrolatum    Specimen 4 - Surgical pathology Differential Diagnosis: SCC vs other (3 pieces)  Check Margins: No ACTINIC SKIN DAMAGE   HISTORY OF NONMELANOMA SKIN CANCER    Return in about 3 months (around 06/03/2024) for AK follow up.  I, Roseline Hutchinson, CMA, am acting as scribe for Fadi Menter K, PA-C .   Documentation: I have reviewed the above documentation for accuracy and completeness, and I agree with the above.  Boss Danielsen K, PA-C

## 2024-03-05 LAB — SURGICAL PATHOLOGY

## 2024-03-12 ENCOUNTER — Other Ambulatory Visit (HOSPITAL_COMMUNITY): Payer: Self-pay

## 2024-03-12 ENCOUNTER — Ambulatory Visit: Payer: Self-pay | Admitting: Physician Assistant

## 2024-03-13 ENCOUNTER — Encounter: Payer: Self-pay | Admitting: Physician Assistant

## 2024-03-13 NOTE — Telephone Encounter (Signed)
 Advised patient's daughter of pathology results. Advised that all 4 results should be scheduled for Mohs. She stated that her father prefers to see Dr Bluford but she will talk to him over the weekend to see if he will consider seeing Dr Corey. I advised her that I will reach out to her again the first of the week/hd

## 2024-03-13 NOTE — Telephone Encounter (Signed)
-----   Message from [facility] K sent at 03/12/2024  9:21 PM EDT ----- Left cheek - SCCis - Mohs  Left zygomatic area - SCCis - Mohs  Left preauricular area - SCCis - Mohs Left dorsal hand - SCCis - Mohs  ----- Message ----- From: Interface, [facility] Sent: 03/05/2024   5:03 PM EDT To: Erminio MARLA Like, PA-C

## 2024-03-19 ENCOUNTER — Other Ambulatory Visit: Payer: Self-pay

## 2024-03-19 ENCOUNTER — Ambulatory Visit (INDEPENDENT_AMBULATORY_CARE_PROVIDER_SITE_OTHER): Admitting: Family Medicine

## 2024-03-19 ENCOUNTER — Telehealth: Payer: Self-pay

## 2024-03-19 ENCOUNTER — Ambulatory Visit: Payer: Self-pay | Admitting: Family Medicine

## 2024-03-19 ENCOUNTER — Encounter: Payer: Self-pay | Admitting: Family Medicine

## 2024-03-19 VITALS — BP 121/68 | HR 59 | Temp 98.0°F | Ht 71.0 in | Wt 140.2 lb

## 2024-03-19 DIAGNOSIS — Z79899 Other long term (current) drug therapy: Secondary | ICD-10-CM

## 2024-03-19 DIAGNOSIS — D099 Carcinoma in situ, unspecified: Secondary | ICD-10-CM

## 2024-03-19 DIAGNOSIS — E1142 Type 2 diabetes mellitus with diabetic polyneuropathy: Secondary | ICD-10-CM

## 2024-03-19 DIAGNOSIS — I482 Chronic atrial fibrillation, unspecified: Secondary | ICD-10-CM

## 2024-03-19 DIAGNOSIS — F411 Generalized anxiety disorder: Secondary | ICD-10-CM

## 2024-03-19 DIAGNOSIS — E119 Type 2 diabetes mellitus without complications: Secondary | ICD-10-CM | POA: Diagnosis not present

## 2024-03-19 DIAGNOSIS — I10 Essential (primary) hypertension: Secondary | ICD-10-CM

## 2024-03-19 DIAGNOSIS — Z7984 Long term (current) use of oral hypoglycemic drugs: Secondary | ICD-10-CM | POA: Diagnosis not present

## 2024-03-19 DIAGNOSIS — Z7901 Long term (current) use of anticoagulants: Secondary | ICD-10-CM

## 2024-03-19 LAB — POCT GLYCOSYLATED HEMOGLOBIN (HGB A1C)
HbA1c POC (<> result, manual entry): 5.9 % (ref 4.0–5.6)
HbA1c, POC (controlled diabetic range): 5.9 % (ref 0.0–7.0)
HbA1c, POC (prediabetic range): 5.9 % (ref 5.7–6.4)
Hemoglobin A1C: 5.9 % — AB (ref 4.0–5.6)

## 2024-03-19 LAB — BASIC METABOLIC PANEL WITH GFR
BUN: 18 mg/dL (ref 6–23)
CO2: 31 meq/L (ref 19–32)
Calcium: 9.6 mg/dL (ref 8.4–10.5)
Chloride: 100 meq/L (ref 96–112)
Creatinine, Ser: 0.88 mg/dL (ref 0.40–1.50)
GFR: 81.55 mL/min (ref >60.00)
Glucose, Bld: 94 mg/dL (ref 70–99)
Potassium: 4.1 meq/L (ref 3.5–5.1)
Sodium: 139 meq/L (ref 135–145)

## 2024-03-19 LAB — MICROALBUMIN / CREATININE URINE RATIO
Creatinine,U: 121.6 mg/dL
Microalb Creat Ratio: 10.2 mg/g (ref 0.0–30.0)
Microalb, Ur: 1.2 mg/dL (ref 0.0–1.9)

## 2024-03-19 NOTE — Progress Notes (Unsigned)
 Office Note 03/23/2024  CC:  Chief Complaint  Patient presents with   Transfer of Care   Medical Management of Chronic Issues    HPI:  Matthew Hall is a 78 y.o. male who is here to transfer care, follow-up chronic medical problems. Patient's most recent primary MD: Benton Gave, PA Old records in epic/health Link were reviewed prior to or during today's visit.  Anxiety: stable.  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  No dizziness.  PMP AWARE reviewed today: most recent rx for appraisal Lamb 0.25 mg was filled 01/02/2024, # 90, rx by Benton Gave, PA. No red flags.  Past Medical History:  Diagnosis Date   Anxiety    Aortic atherosclerosis (HCC)    Atrial fibrillation (HCC)    Basal cell carcinoma    Bladder cancer (HCC)    Chronic systolic heart failure (HCC)    Colon polyps    COPD (chronic obstructive pulmonary disease) (HCC)    Diabetes mellitus with complication (HCC)    Diabetic peripheral neuropathy (HCC) 12/27/2020   Diverticulosis    Essential hypertension    GERD (gastroesophageal reflux disease)    History of CVA with residual deficit    R foot drop   History of pneumonia    Nephrolithiasis    Osteoarthritis    Right foot drop    sequelae of cva   Squamous cell carcinoma of skin 03/03/2024   face and hand, needs mohs    Past Surgical History:  Procedure Laterality Date   BRONCHIAL WASHINGS  06/19/2023   Procedure: BRONCHIAL WASHINGS;  Surgeon: Gretta Leita SQUIBB, DO;  Location: [facility];  Service: Cardiopulmonary;;   COLONOSCOPY     Kidney stones removed  1996   SKIN CANCER EXCISION Left    2003   TRANSTHORACIC ECHOCARDIOGRAM     04/2023 EF 40-45, mild dec LV fxn, mod tricusp regurg   TRANSURETHRAL RESECTION OF BLADDER TUMOR  1996   VIDEO BRONCHOSCOPY N/A 06/19/2023   Procedure: VIDEO BRONCHOSCOPY WITHOUT FLUORO;  Surgeon: Gretta Leita SQUIBB, DO;  Location: [facility];  Service: Cardiopulmonary;  Laterality: N/A;    Family History  Problem Relation Age of Onset   Diabetes Mother    CAD Mother    Emphysema Father    Diabetes Sister    Stroke Sister    Lung cancer Brother    Liver cancer Brother    Colon cancer Neg Hx     Social History   Socioeconomic History   Marital status: Married    Spouse name: Not on file   Number of children: 3   Years of education: Not on file   Highest education level: Not on file  Occupational History   Occupation: Pipefilter/retired    Comment: [employer]  Tobacco Use   Smoking status: Former    Current packs/day: 0.00    Average packs/day: 1 pack/day for 30.0 years (30.0 ttl pk-yrs)    Types: Cigarettes    Start date: 07/06/1981    Quit date: 07/07/2011    Years since quitting: 12.7   Smokeless tobacco: Never  Vaping Use   Vaping status: Never Used  Substance and Sexual Activity   Alcohol  use: No    Alcohol /week: 0.0 standard drinks of alcohol    Drug use: No   Sexual activity: Not Currently  Other Topics Concern   Not on file  Social History Narrative   Lives with  wife   Caffeine- 1 cup a day   Right handed    Social Drivers of Health   Financial Resource Strain: Not on file  Food Insecurity: No Food Insecurity (10/29/2023)   Hunger Vital Sign    Worried About Running Out of Food in the Last Year: Never true    Ran Out of Food in the Last Year: Never true  Transportation Needs: No Transportation Needs (10/29/2023)   PRAPARE - Administrator, Civil Service (Medical): No    Lack of Transportation (Non-Medical): No  Physical Activity: Not on file  Stress: Not on file  Social Connections: Moderately Isolated (10/29/2023)   Social Connection and Isolation Panel    Frequency of Communication with Friends and Family: More than three times a week    Frequency of Social Gatherings with Friends and Family: Once a week    Attends Religious Services: Never     Database administrator or Organizations: No    Attends Banker Meetings: Never    Marital Status: Married  Catering manager Violence: Not At Risk (10/29/2023)   Humiliation, Afraid, Rape, and Kick questionnaire    Fear of Current or Ex-Partner: No    Emotionally Abused: No    Physically Abused: No    Sexually Abused: No    Outpatient Encounter Medications as of 03/19/2024  Medication Sig   Acetaminophen  (TYLENOL ) 325 MG CAPS Take 1-2 capsules by mouth every 6 (six) hours as needed (pain).   ALPRAZolam  (XANAX ) 0.25 MG tablet Take 1 tablet (0.25 mg total) by mouth 2 (two) times daily as needed for anxiety or sleep.   apixaban  (ELIQUIS ) 5 MG TABS tablet Take 1 tablet (5 mg total) by mouth 2 (two) times daily.   budesonide -glycopyrrolate -formoterol  (BREZTRI  AEROSPHERE) 160-9-4.8 MCG/ACT AERO inhaler Inhale 2 puffs into the lungs 2 (two) times daily.   busPIRone  (BUSPAR ) 10 MG tablet Take 1 tablet (10 mg total) by mouth 2 (two) times daily.   Cyanocobalamin  (VITAMIN B12 PO) Take 1 tablet by mouth daily.   feeding supplement (ENSURE ENLIVE / ENSURE PLUS) LIQD Take 237 mLs by mouth 2 (two) times daily between meals.   guaiFENesin  (MUCINEX ) 600 MG 12 hr tablet Take 1 tablet (600 mg total) by mouth 2 (two) times daily.   hydrochlorothiazide  (HYDRODIURIL ) 12.5 MG tablet Take 1 tablet (12.5 mg total) by mouth as needed.   levalbuterol  (XOPENEX  HFA) 45 MCG/ACT inhaler Inhale 2 puffs into the lungs every 6 (six) hours as needed for wheezing.   levalbuterol  (XOPENEX ) 0.63 MG/3ML nebulizer solution Take 3 mLs (0.63 mg total) by nebulization every 6 (six) hours as needed for wheezing or shortness of breath.   metFORMIN  (GLUCOPHAGE -XR) 500 MG 24 hr tablet Take 1 tablet (500 mg total) by mouth daily.   metoprolol  succinate (TOPROL -XL) 25 MG 24 hr tablet Take 1 tablet (25 mg total) by mouth 2 (two) times daily.   omeprazole  (PRILOSEC) 20 MG capsule Take 1 capsule (20 mg total) by mouth daily.    ondansetron  (ZOFRAN ) 4 MG tablet Take 1 tablet (4 mg total) by mouth 3 (three) times daily as needed for nausea/vomiting.   triamcinolone  (NASACORT ) 55 MCG/ACT AERO nasal inhaler Place 1 spray into the nose daily.   No facility-administered encounter medications on file as of 03/19/2024.    Allergies  Allergen Reactions   Simvastatin Rash   Cortisone Nausea And Vomiting   Albuterol  Palpitations and Other (See Comments)    Pt is okay to take  xopenex , albuterol  raises his heart rate   Augmentin [Amoxicillin-Pot Clavulanate] Rash   Ciprofloxacin Rash   Contrast Media [Iodinated Contrast Media] Rash   Flagyl  [Metronidazole ] Rash   Latex Hives and Rash   Sulfa  Antibiotics Rash    Review of Systems ROS: no fevers, no CP, no SOB, no wheezing, no cough, no dizziness, no HAs, no rashes, no melena/hematochezia.  No polyuria or polydipsia.  No myalgias or arthralgias.  No tremors.  No acute vision or hearing abnormalities.  No dysuria or unusual/new urinary urgency or frequency.  No recent changes in lower legs. No n/v/d or abd pain.  No palpitations.    PE; Blood pressure 121/68, pulse (!) 59, temperature 98 F (36.7 C), temperature source Oral, height 5' 11 (1.803 m), weight 140 lb 3.2 oz (63.6 kg), SpO2 94%. Body mass index is 19.55 kg/m.  Physical Exam  Gen: Alert, well appearing.  Patient is oriented to person, place, time, and situation. CV: Irregularly irregular, soft systolic murmur, no diastolic murmur. Lungs are clear bilaterally, breathing is unlabored. Extremities: No edema. Foot exam -  no swelling, tenderness or skin or vascular lesions. Color and temperature is normal.  Sensation to monofilament testing is impaired on the entire plantar surface of both feet. Peripheral pulses are palpable. Toenails are normal.   Pertinent labs:  Last CBC Lab Results  Component Value Date   WBC 10.0 10/31/2023   HGB 12.8 (L) 10/31/2023   HCT 41.2 10/31/2023   MCV 90.7 10/31/2023    MCH 28.2 10/31/2023   RDW 14.1 10/31/2023   PLT 314 10/31/2023   Last metabolic panel Lab Results  Component Value Date   GLUCOSE 94 03/19/2024   NA 139 03/19/2024   K 4.1 03/19/2024   CL 100 03/19/2024   CO2 31 03/19/2024   BUN 18 03/19/2024   CREATININE 0.88 03/19/2024   EGFR 77 12/19/2023   CALCIUM  9.6 03/19/2024   PHOS 2.9 08/14/2023   PROT 6.7 10/28/2023   ALBUMIN 3.1 (L) 10/28/2023   LABGLOB 3.3 05/09/2023   LABGLOB 2.8 05/09/2023   BILITOT 1.1 10/28/2023   ALKPHOS 43 10/28/2023   AST 31 10/28/2023   ALT 12 10/28/2023   ANIONGAP 10 10/31/2023   Last lipids Lab Results  Component Value Date   CHOL 118 09/10/2023   HDL 31.40 (L) 09/10/2023   LDLCALC 69 09/10/2023   TRIG 89.0 09/10/2023   CHOLHDL 4 09/10/2023   Last hemoglobin A1c Lab Results  Component Value Date   HGBA1C 5.9 (A) 03/19/2024   HGBA1C 5.9 03/19/2024   HGBA1C 5.9 03/19/2024   HGBA1C 5.9 03/19/2024   Last thyroid  functions Lab Results  Component Value Date   TSH 1.36 09/10/2023   Last vitamin D  Lab Results  Component Value Date   VD25OH 81.52 09/10/2023   Last vitamin B12 and Folate Lab Results  Component Value Date   VITAMINB12 >1537 (H) 09/10/2023   FOLATE 8.8 09/10/2023   ASSESSMENT AND PLAN:   New patient, transferring care.  1.  Diabetes with peripheral neuropathy. Good control. Point-of-care hemoglobin A1c today is 5.9% Continue metformin  XR 500 mg daily. Monitor renal function today and urine microalbumin/creatinine today.  Feet exam today confirms peripheral neuropathy.  2.  Hypertension, well-controlled on HCTZ 12.5 mg daily and Toprol -XL 25 mg twice daily.  Back through the notes he has a history of labile readings, at 1 point was on losartan -HCT, this spring there is documentation that he should take his HCTZ only on a as  needed basis. Monitor basic metabolic panel today.  #3 atrial fibrillation.  Asymptomatic. Rate controlled with Toprol -XL 25 mg twice  daily. Continue stroke prophylaxis with Eliquis  5 mg twice daily. He says he has not followed up with his cardiologist in about a year or so (Dr. Debera). I encouraged him to do so but in the meantime he will send me his dentist's preprocedure instructions for the Xarelto that will need to be completed/authorized.  #4 anxiety and anxiety-related insomnia. He is on Xanax  0.25 mg twice daily as needed.  An After Visit Summary was printed and given to the patient.  Return in about 3 months (around 06/18/2024) for routine chronic illness f/u.  Signed:  Gerlene Hockey, MD           03/23/2024

## 2024-03-19 NOTE — Telephone Encounter (Signed)
-----   Message from [facility] K sent at 03/12/2024  9:21 PM EDT ----- Left cheek - SCCis - Mohs  Left zygomatic area - SCCis - Mohs  Left preauricular area - SCCis - Mohs Left dorsal hand - SCCis - Mohs  ----- Message ----- From: Interface, [facility] Sent: 03/05/2024   5:03 PM EDT To: Erminio MARLA Like, PA-C

## 2024-03-19 NOTE — Telephone Encounter (Signed)
   Pre-operative Risk Assessment    Patient Name: Matthew Hall  DOB: 09-29-1945 MRN: [id number]   Date of last office visit: 05/14/23 OLIVIA PAVY, PS-C Date of next office visit: NONE  Request for Surgical Clearance    Procedure:  Dental Extraction - Amount of Teeth to be Pulled:  5 TEETH  Date of Surgery:  Clearance TBD                                Surgeon:  DR [facility] Surgeon's Group or Practice Name:  [facility] Phone number:  [phone] Fax number:  [phone]   Type of Clearance Requested:   - Medical  - Pharmacy:  Hold Apixaban  (Eliquis )     Type of Anesthesia:  Not Indicated   Additional requests/questions:    Signed, Lucie DELENA Ku   03/19/2024, 10:17 AM

## 2024-03-19 NOTE — Telephone Encounter (Signed)
 Pharmacy please advise on holding Eliquis  prior to dental extraction (5 teeth) scheduled for TBD. Last labs (CBC and BMET 10/31/2023). Thank you.

## 2024-03-19 NOTE — Telephone Encounter (Signed)
 03/18/2024 [place] spoke with patient's daughter and with patient. He is very insistent on going to Dr Bluford for Mohs since he has done all his other surgeries and he is the one he trusts to do surgery on his face. We will send a referral to Dr Metta office but advised the patient that Dr Bluford is currently scheduling in January and his skin cancer could likely grow before he has surgery/hd

## 2024-03-20 ENCOUNTER — Telehealth: Payer: Self-pay | Admitting: *Deleted

## 2024-03-20 NOTE — Telephone Encounter (Signed)
 Daughter Edwardo) returned Pre-Op call.

## 2024-03-20 NOTE — Telephone Encounter (Signed)
   Name: Matthew Hall  DOB: June 05, 1946  MRN: [id number]  Primary Cardiologist: None   Preoperative team, please contact this patient and set up a phone call appointment for further preoperative risk assessment. Please obtain consent and complete medication review. Thank you for your help.  I confirm that guidance regarding antiplatelet and oral anticoagulation therapy has been completed and, if necessary, noted below.  Patient will be high risk off anticoagulation and has history of CVA. Recommend 1 day Eliquis  hold.   I also confirmed the patient resides in the state of [place] . As per [facility] Medical Board telemedicine laws, the patient must reside in the state in which the provider is licensed.   Jon Garre Brisha Mccabe, PA 03/20/2024, 1:50 PM [facility]

## 2024-03-20 NOTE — Telephone Encounter (Signed)
 S/w the pt's daughter [facility]) pt needs tele preop appt. Pt has been scheduled tele preop appt 04/13/2024. Pt's daughters phone died out as we were speaking . She did hear the appt for April 13, 2024 @ 2 pm.   Med rec and consent are done.

## 2024-03-20 NOTE — Telephone Encounter (Signed)
 Patient with diagnosis of A Fib on Eliquis  for anticoagulation.    Procedure: dental extraction (5 teeth)  Date of procedure: TBD   CHA2DS2-VASc Score = 8  This indicates a 10.8% annual risk of stroke. The patient's score is based upon: CHF History: 1 HTN History: 1 Diabetes History: 1 Stroke History: 2 Vascular Disease History: 1 Age Score: 2 Gender Score: 0     CrCl 61 ml/min Platelet count 314K  Patient has not had an Afib/aflutter ablation or Watchman within the last 3 months or DCCV within the last 30 days   Patient does not require pre-op antibiotics for dental procedure.  Patient will be high risk off anticoagulation and has history of CVA. Recommend 1 day Eliquis  hold.   Patient will not need bridging with Lovenox  (enoxaparin ) around procedure.  **This guidance is not considered finalized until pre-operative APP has relayed final recommendations.**

## 2024-03-20 NOTE — Telephone Encounter (Signed)
 S/w the pt's daughter [facility]) pt needs tele preop appt. Pt has been scheduled tele preop appt Apr 16, 2024. Pt's daughters phone died out as we were speaking . She did hear the appt for 04-16-2024 @ 2 pm.   Med rec and consent are done.     Patient Consent for Virtual Visit        Matthew Hall has provided verbal consent on 03/20/2024 for a virtual visit (video or telephone).   CONSENT FOR VIRTUAL VISIT FOR:  Matthew Hall  By participating in this virtual visit I agree to the following:  I hereby voluntarily request, consent and authorize [facility] and its employed or contracted physicians, physician assistants, nurse practitioners or other licensed health care professionals (the Practitioner), to provide me with telemedicine health care services (the "Services) as deemed necessary by the treating Practitioner. I acknowledge and consent to receive the Services by the Practitioner via telemedicine. I understand that the telemedicine visit will involve communicating with the Practitioner through live audiovisual communication technology and the disclosure of certain medical information by electronic transmission. I acknowledge that I have been given the opportunity to request an in-person assessment or other available alternative prior to the telemedicine visit and am voluntarily participating in the telemedicine visit.  I understand that I have the right to withhold or withdraw my consent to the use of telemedicine in the course of my care at any time, without affecting my right to future care or treatment, and that the Practitioner or I may terminate the telemedicine visit at any time. I understand that I have the right to inspect all information obtained and/or recorded in the course of the telemedicine visit and may receive copies of available information for a reasonable fee.  I understand that some of the potential risks of receiving the Services via telemedicine include:  Delay or  interruption in medical evaluation due to technological equipment failure or disruption; Information transmitted may not be sufficient (e.g. poor resolution of images) to allow for appropriate medical decision making by the Practitioner; and/or  In rare instances, security protocols could fail, causing a breach of personal health information.  Furthermore, I acknowledge that it is my responsibility to provide information about my medical history, conditions and care that is complete and accurate to the best of my ability. I acknowledge that Practitioner's advice, recommendations, and/or decision may be based on factors not within their control, such as incomplete or inaccurate data provided by me or distortions of diagnostic images or specimens that may result from electronic transmissions. I understand that the practice of medicine is not an exact science and that Practitioner makes no warranties or guarantees regarding treatment outcomes. I acknowledge that a copy of this consent can be made available to me via my patient portal [facility] MyChart), or I can request a printed copy by calling the office of [facility].    I understand that my insurance will be billed for this visit.   I have read or had this consent read to me. I understand the contents of this consent, which adequately explains the benefits and risks of the Services being provided via telemedicine.  I have been provided ample opportunity to ask questions regarding this consent and the Services and have had my questions answered to my satisfaction. I give my informed consent for the services to be provided through the use of telemedicine in my medical care

## 2024-03-20 NOTE — Telephone Encounter (Signed)
 Pt's daughter did call back as we lost connection before. We did review medications and consent was given.     Patient Consent for Virtual Visit        Matthew Hall has provided verbal consent on 03/20/2024 for a virtual visit (video or telephone).   CONSENT FOR VIRTUAL VISIT FOR:  Matthew Hall  By participating in this virtual visit I agree to the following:  I hereby voluntarily request, consent and authorize [facility] and its employed or contracted physicians, physician assistants, nurse practitioners or other licensed health care professionals (the Practitioner), to provide me with telemedicine health care services (the "Services) as deemed necessary by the treating Practitioner. I acknowledge and consent to receive the Services by the Practitioner via telemedicine. I understand that the telemedicine visit will involve communicating with the Practitioner through live audiovisual communication technology and the disclosure of certain medical information by electronic transmission. I acknowledge that I have been given the opportunity to request an in-person assessment or other available alternative prior to the telemedicine visit and am voluntarily participating in the telemedicine visit.  I understand that I have the right to withhold or withdraw my consent to the use of telemedicine in the course of my care at any time, without affecting my right to future care or treatment, and that the Practitioner or I may terminate the telemedicine visit at any time. I understand that I have the right to inspect all information obtained and/or recorded in the course of the telemedicine visit and may receive copies of available information for a reasonable fee.  I understand that some of the potential risks of receiving the Services via telemedicine include:  Delay or interruption in medical evaluation due to technological equipment failure or disruption; Information transmitted may not  be sufficient (e.g. poor resolution of images) to allow for appropriate medical decision making by the Practitioner; and/or  In rare instances, security protocols could fail, causing a breach of personal health information.  Furthermore, I acknowledge that it is my responsibility to provide information about my medical history, conditions and care that is complete and accurate to the best of my ability. I acknowledge that Practitioner's advice, recommendations, and/or decision may be based on factors not within their control, such as incomplete or inaccurate data provided by me or distortions of diagnostic images or specimens that may result from electronic transmissions. I understand that the practice of medicine is not an exact science and that Practitioner makes no warranties or guarantees regarding treatment outcomes. I acknowledge that a copy of this consent can be made available to me via my patient portal [facility] MyChart), or I can request a printed copy by calling the office of [facility].    I understand that my insurance will be billed for this visit.   I have read or had this consent read to me. I understand the contents of this consent, which adequately explains the benefits and risks of the Services being provided via telemedicine.  I have been provided ample opportunity to ask questions regarding this consent and the Services and have had my questions answered to my satisfaction. I give my informed consent for the services to be provided through the use of telemedicine in my medical care

## 2024-03-21 ENCOUNTER — Other Ambulatory Visit (HOSPITAL_BASED_OUTPATIENT_CLINIC_OR_DEPARTMENT_OTHER): Payer: Self-pay

## 2024-03-21 ENCOUNTER — Ambulatory Visit: Admitting: Family Medicine

## 2024-03-21 ENCOUNTER — Encounter: Payer: Self-pay | Admitting: Family Medicine

## 2024-03-21 VITALS — BP 110/70 | HR 79 | Temp 97.8°F | Wt 140.0 lb

## 2024-03-21 DIAGNOSIS — J329 Chronic sinusitis, unspecified: Secondary | ICD-10-CM

## 2024-03-21 DIAGNOSIS — J029 Acute pharyngitis, unspecified: Secondary | ICD-10-CM | POA: Diagnosis not present

## 2024-03-21 DIAGNOSIS — B9689 Other specified bacterial agents as the cause of diseases classified elsewhere: Secondary | ICD-10-CM

## 2024-03-21 DIAGNOSIS — R0982 Postnasal drip: Secondary | ICD-10-CM

## 2024-03-21 LAB — POCT INFLUENZA A/B
Influenza A, POC: NEGATIVE
Influenza B, POC: NEGATIVE

## 2024-03-21 LAB — POC COVID19 BINAXNOW: SARS Coronavirus 2 Ag: NEGATIVE

## 2024-03-21 MED ORDER — AZITHROMYCIN 250 MG PO TABS
ORAL_TABLET | ORAL | 0 refills | Status: AC
  Filled 2024-03-21: qty 6, 5d supply, fill #0

## 2024-03-21 NOTE — Patient Instructions (Addendum)
 Return in about 2 weeks (around 04/04/2024), or if symptoms worsen or fail to improve.        Great to see you today.  I have refilled the medication(s) we provide.   If labs were collected or images ordered, we will inform you of  results once we have received them and reviewed. We will contact you either by echart message, or telephone call.  Please give ample time to the testing facility, and our office to run,  receive and review results. Please do not call inquiring of results, even if you can see them in your chart. We will contact you as soon as we are able. If it has been over 1 week since the test was completed, and you have not yet heard from us , then please call us .    - echart message- for normal results that have been seen by the patient already.   - telephone call: abnormal results or if patient has not viewed results in their echart.  If a referral to a specialist was entered for you, please call us  in 2 weeks if you have not heard from the specialist office to schedule.

## 2024-03-21 NOTE — Progress Notes (Signed)
 Matthew Hall , 1946/01/01, 78 y.o., male MRN: [id number] Patient Care Team    Relationship Specialty Notifications Start End  [name], [name], MD PCP - General Family Medicine  03/19/24   Kendall Mickey Zachary DELENA., MD Consulting Physician Urology  04/16/19     Chief Complaint  Patient presents with   Sore Throat    Pt started having a sore throat yesterday morning. Pt has not tested for flu or covid     Subjective: Matthew Hall is a 78 y.o. Pt presents for an OV with complaints of sore throat of 2 days duration.  Patient reports he has recurrent sinus infections that normally respond well to a Z-Pak.  He feels he has an increase in postnasal drip and drainage over the last week.  He does have chronic allergy and is on an allergy regimen.   SEE ROS  Pt has tried Nasacort  and Allegra to ease their symptoms.   H/o COPD, DM, HF, A.fib     03/19/2024    9:26 AM 10/03/2023   10:28 AM 06/13/2018    8:02 AM 12/24/2017    1:51 PM 05/21/2017   11:39 AM  Depression screen PHQ 2/9  Decreased Interest 1 1 1  0 0  Down, Depressed, Hopeless 0 0 0 0 0  PHQ - 2 Score 1 1 1  0 0  Altered sleeping 1 1 1 1    Tired, decreased energy 1 1 1 2    Change in appetite 1 1 0 2   Feeling bad or failure about yourself  0 0 0 0   Trouble concentrating 0 0 0 0   Moving slowly or fidgety/restless 0 0 0 0   Suicidal thoughts 0 0 0 0   PHQ-9 Score 4 4 3 5    Difficult doing work/chores Not difficult at all Not difficult at all Not difficult at all Somewhat difficult     Allergies  Allergen Reactions   Simvastatin Rash   Cortisone Nausea And Vomiting   Albuterol  Palpitations and Other (See Comments)    Pt is okay to take xopenex , albuterol  raises his heart rate   Augmentin [Amoxicillin-Pot Clavulanate] Rash   Ciprofloxacin Rash   Contrast Media [Iodinated Contrast Media] Rash   Flagyl  [Metronidazole ] Rash   Latex Hives and Rash   Sulfa  Antibiotics Rash   Social History   Social History  Narrative   Lives with wife   Caffeine- 1 cup a day   Right handed    Past Medical History:  Diagnosis Date   Anxiety    Aortic atherosclerosis (HCC)    Atrial fibrillation (HCC)    Basal cell carcinoma    Bladder cancer (HCC)    Bladder tumor    Chronic systolic heart failure (HCC)    Colon polyps    COPD (chronic obstructive pulmonary disease) (HCC)    Cutaneous vasculitis    Diabetes (HCC)    Diabetic peripheral neuropathy (HCC) 12/27/2020   Diverticulosis    Essential hypertension    GERD (gastroesophageal reflux disease)    History of pneumonia    Nephrolithiasis    Osteoarthritis    Pain due to onychomycosis of toenails of both feet 07/02/2023   Right foot drop    Squamous cell carcinoma of skin 03/03/2024   in situ - Left cheek - needs Mohs   Squamous cell carcinoma of skin 03/03/2024   in situ - Left zygomatic - needs Mohs   Squamous cell carcinoma of  skin 03/03/2024   in situ - Left preauricular - needs Mohs   Squamous cell carcinoma of skin 03/03/2024   in situ - Left dorsal hand - needs Mohs   Stroke [facility])    Past Surgical History:  Procedure Laterality Date   BRONCHIAL WASHINGS  06/19/2023   Procedure: BRONCHIAL WASHINGS;  Surgeon: Gretta Leita SQUIBB, DO;  Location: [facility] ENDOSCOPY;  Service: Cardiopulmonary;;   COLONOSCOPY     Kidney stones removed  1996   SKIN CANCER EXCISION Left    2003   TRANSTHORACIC ECHOCARDIOGRAM     04/2023 EF 40-45, mild dec LV fxn, mod tricusp regurg   TRANSURETHRAL RESECTION OF BLADDER TUMOR  1996   VIDEO BRONCHOSCOPY N/A 06/19/2023   Procedure: VIDEO BRONCHOSCOPY WITHOUT FLUORO;  Surgeon: Gretta Leita SQUIBB, DO;  Location: [facility] ENDOSCOPY;  Service: Cardiopulmonary;  Laterality: N/A;   Family History  Problem Relation Age of Onset   Diabetes Mother    CAD Mother    Emphysema Father    Diabetes Sister    Stroke Sister    Lung cancer Brother    Liver cancer Brother    Colon cancer Neg Hx    Allergies as of 03/21/2024        Reactions   Simvastatin Rash   Cortisone Nausea And Vomiting   Albuterol  Palpitations, Other (See Comments)   Pt is okay to take xopenex , albuterol  raises his heart rate   Augmentin [amoxicillin-pot Clavulanate] Rash   Ciprofloxacin Rash   Contrast Media [iodinated Contrast Media] Rash   Flagyl  [metronidazole ] Rash   Latex Hives, Rash   Sulfa  Antibiotics Rash        Medication List        Accurate as of March 21, 2024 11:03 AM. If you have any questions, ask your nurse or doctor.          ALPRAZolam  0.25 MG tablet Commonly known as: XANAX  Take 1 tablet (0.25 mg total) by mouth 2 (two) times daily as needed for anxiety or sleep.   azithromycin  250 MG tablet Commonly known as: ZITHROMAX  Take 2 tablets on day 1, then 1 tablet daily on days 2 through 5 Started by: Charlies Bellini   Breztri  Aerosphere 160-9-4.8 MCG/ACT Aero inhaler Generic drug: budesonide -glycopyrrolate -formoterol  Inhale 2 puffs into the lungs 2 (two) times daily.   busPIRone  10 MG tablet Commonly known as: BUSPAR  Take 1 tablet (10 mg total) by mouth 2 (two) times daily.   Eliquis  5 MG Tabs tablet Generic drug: apixaban  Take 1 tablet (5 mg total) by mouth 2 (two) times daily.   feeding supplement Liqd Take 237 mLs by mouth 2 (two) times daily between meals.   guaiFENesin  600 MG 12 hr tablet Commonly known as: MUCINEX  Take 1 tablet (600 mg total) by mouth 2 (two) times daily.   hydrochlorothiazide  12.5 MG tablet Commonly known as: HYDRODIURIL  Take 1 tablet (12.5 mg total) by mouth as needed.   levalbuterol  0.63 MG/3ML nebulizer solution Commonly known as: Xopenex  Take 3 mLs (0.63 mg total) by nebulization every 6 (six) hours as needed for wheezing or shortness of breath.   levalbuterol  45 MCG/ACT inhaler Commonly known as: XOPENEX  HFA Inhale 2 puffs into the lungs every 6 (six) hours as needed for wheezing.   metFORMIN  500 MG 24 hr tablet Commonly known as: GLUCOPHAGE -XR Take 1 tablet  (500 mg total) by mouth daily.   metoprolol  succinate 25 MG 24 hr tablet Commonly known as: TOPROL -XL Take 1 tablet (25 mg total) by mouth  2 (two) times daily.   omeprazole  20 MG capsule Commonly known as: PRILOSEC Take 1 capsule (20 mg total) by mouth daily.   ondansetron  4 MG tablet Commonly known as: ZOFRAN  Take 1 tablet (4 mg total) by mouth 3 (three) times daily as needed for nausea/vomiting.   triamcinolone  55 MCG/ACT Aero nasal inhaler Commonly known as: NASACORT  Place 1 spray into the nose daily.   Tylenol  325 MG Caps Generic drug: Acetaminophen  Take 1-2 capsules by mouth every 6 (six) hours as needed (pain).   VITAMIN B12 PO Take 1 tablet by mouth daily.        All past medical history, surgical history, allergies, family history, immunizations andmedications were updated in the EMR today and reviewed under the history and medication portions of their EMR.     Review of Systems  Constitutional:  Positive for malaise/fatigue. Negative for chills and fever.  HENT:  Positive for sore throat. Negative for congestion, ear pain and sinus pain.   Respiratory:  Negative for cough and shortness of breath.   Gastrointestinal:  Negative for abdominal pain, constipation, diarrhea, nausea and vomiting.  Musculoskeletal:  Negative for myalgias.  Neurological:  Negative for dizziness.   Negative, with the exception of above mentioned in HPI   Objective:  BP 110/70   Pulse 79   Temp 97.8 F (36.6 C) (Oral)   Wt 140 lb (63.5 kg)   SpO2 97%   BMI 19.53 kg/m  Body mass index is 19.53 kg/m. Physical Exam Vitals and nursing note reviewed. Exam conducted with a chaperone present.  Constitutional:      General: He is not in acute distress.    Appearance: Normal appearance. He is not ill-appearing, toxic-appearing or diaphoretic.  HENT:     Head: Normocephalic and atraumatic.     Right Ear: Tympanic membrane and ear canal normal. There is no impacted cerumen.     Left  Ear: Tympanic membrane and ear canal normal. There is no impacted cerumen.     Nose: Septal deviation and rhinorrhea present. No congestion.     Right Sinus: Maxillary sinus tenderness present.     Left Sinus: Maxillary sinus tenderness present.     Comments: Erythema bilateral nostrils    Mouth/Throat:     Mouth: Mucous membranes are moist.     Pharynx: Posterior oropharyngeal erythema present. No oropharyngeal exudate.  Eyes:     General: No scleral icterus.       Right eye: No discharge.        Left eye: No discharge.     Extraocular Movements: Extraocular movements intact.     Pupils: Pupils are equal, round, and reactive to light.  Cardiovascular:     Rate and Rhythm: Normal rate and regular rhythm.  Pulmonary:     Effort: Pulmonary effort is normal. No respiratory distress.     Breath sounds: Normal breath sounds. No wheezing, rhonchi or rales.  Musculoskeletal:     Cervical back: Neck supple. No tenderness.     Right lower leg: No edema.     Left lower leg: No edema.  Lymphadenopathy:     Cervical: No cervical adenopathy.  Skin:    General: Skin is warm.     Findings: No rash.  Neurological:     Mental Status: He is alert and oriented to person, place, and time. Mental status is at baseline.  Psychiatric:        Mood and Affect: Mood normal.  Behavior: Behavior normal.        Thought Content: Thought content normal.        Judgment: Judgment normal.      No results found. No results found. Results for orders placed or performed in visit on 03/21/24 (from the past 24 hours)  POC COVID-19 BinaxNow     Status: None   Collection Time: 03/21/24 10:54 AM  Result Value Ref Range   SARS Coronavirus 2 Ag Negative Negative  POCT Influenza A/B     Status: None   Collection Time: 03/21/24 10:54 AM  Result Value Ref Range   Influenza A, POC Negative Negative   Influenza B, POC Negative Negative    Assessment/Plan: LADARRIOUS KIRKSEY is a 78 y.o. male present for  OV for  Sore throat - POC COVID-19 BinaxNow-negative - POCT Influenza A/B-negative Post-nasal drip Continue Nasacort  and Allegra  Bacterial sinusitis (Primary) Rest, hydrate.  nasal saline rinses twice a day recommended azith prescribed since he reports it has been helpful for him with his other infections, take until completed.  F/U 2 weeks if not improved.  Sooner if worsening  Reviewed expectations re: course of current medical issues. Discussed self-management of symptoms. Outlined signs and symptoms indicating need for more acute intervention. Patient verbalized understanding and all questions were answered. Patient received an After-Visit Summary.    Orders Placed This Encounter  Procedures   POC COVID-19 BinaxNow   POCT Influenza A/B   Meds ordered this encounter  Medications   azithromycin  (ZITHROMAX ) 250 MG tablet    Sig: Take 2 tablets on day 1, then 1 tablet daily on days 2 through 5    Dispense:  6 tablet    Refill:  0   Referral Orders  No referral(s) requested today     Note is dictated utilizing voice recognition software. Although note has been proof read prior to signing, occasional typographical errors still can be missed. If any questions arise, please do not hesitate to call for verification.   electronically signed by:  Charlies Bellini, DO  [facility] - [place]

## 2024-03-23 ENCOUNTER — Encounter: Payer: Self-pay | Admitting: Family Medicine

## 2024-03-24 ENCOUNTER — Other Ambulatory Visit: Payer: Self-pay

## 2024-03-24 ENCOUNTER — Other Ambulatory Visit: Payer: Self-pay | Admitting: Urgent Care

## 2024-03-24 DIAGNOSIS — I480 Paroxysmal atrial fibrillation: Secondary | ICD-10-CM

## 2024-03-24 MED FILL — Metformin HCl Tab ER 24HR 500 MG: ORAL | 30 days supply | Qty: 30 | Fill #1 | Status: AC

## 2024-03-25 ENCOUNTER — Other Ambulatory Visit: Payer: Self-pay

## 2024-03-26 ENCOUNTER — Other Ambulatory Visit: Payer: Self-pay

## 2024-03-26 ENCOUNTER — Ambulatory Visit: Admitting: Internal Medicine

## 2024-03-26 ENCOUNTER — Other Ambulatory Visit: Payer: Self-pay | Admitting: Family Medicine

## 2024-03-26 VITALS — BP 137/70 | HR 58 | Temp 97.5°F | Resp 16 | Wt 142.0 lb

## 2024-03-26 DIAGNOSIS — I480 Paroxysmal atrial fibrillation: Secondary | ICD-10-CM

## 2024-03-26 DIAGNOSIS — J479 Bronchiectasis, uncomplicated: Secondary | ICD-10-CM

## 2024-03-26 NOTE — Patient Instructions (Signed)
 Continue to monitor chest ct and your respiratory symptoms   If worsening of both then we'll need sputum culture again to see if you are infected with the atypical mycobacteria    Avoid azithromycin  if you can as if we need to use it later for this atypical mycobacteria infection if it's present  Instead, use doxycycline /augmentin for pneumonia or bronchiectasis exacerbation

## 2024-03-26 NOTE — Progress Notes (Signed)
 [facility]  Reason for Consult:ntm lung Referring Provider: Benton Gave    Patient Active Problem List   Diagnosis Date Noted   Cognitive impairment 01/08/2024   Aphasia 10/28/2023   Lung nodule 10/28/2023   Chronic respiratory failure with hypoxia (HCC) 10/25/2023   Gait abnormality 09/24/2023   DM2 (diabetes mellitus, type 2) (HCC) 07/13/2023   GAD (generalized anxiety disorder) 07/13/2023   Chronic heart failure with mildly reduced ejection fraction (HFmrEF, 41-49%) (HCC) 07/13/2023   Malnutrition of moderate degree 07/13/2023   Paronychia of great toe of left foot 07/02/2023   Weakness of right foot 05/09/2023   History of CVA (cerebrovascular accident) 04/12/2023   Anxiety 04/12/2023   Diabetic peripheral neuropathy (HCC) 12/27/2020   Paresthesia 12/01/2020   Elevated sed rate 05/21/2017   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) 03/01/2017   Vasculitis (HCC) 02/20/2017   Atrial fibrillation, chronic (HCC) 07/09/2016   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 male here for ntm evaluation  My summary of chart review and talking to patient #copd #bronchiectasis #hx tobacco use quit 2012 Atrovent  neb prn Budeson-glycopyrrol-formoterol  inhaler Albuterol  inhaler prn Triamcinolone  nasocort Flutter valve  Ct 06/2023 bronchiectasis and extensive peribronchovascular nodularity 06/2023 BAL cx steno maltophilia; afb cx negative; fungal cx negative Hx recurrent exacerbation; recent exacerbation 07/2023 admission tx with abx for pna and prednisone  taper See pulmonology dr Harden Staff Pft -- 10/2022  fvc 2.2 (51% pre); fev1 1.05 (33% pre); fev1/fvc 47%; fef 25-75 0.35 (16% 11/2017  fvc 1.78 (39%); fev1 0.94 (28%); fev1/fvc (53%)  Baseline activity: Walks with fww; prn oxygen  supplement (today visit doesn't use oxygen ) Walks  flat ground 1/4 mile without getting short of breath 10 step of stairs slowly Takes care of wife as care giver Does laundry Doesn't think he can vacuum; haven't tried for the past year since 02/2023 since he had a stroke  No fever, chill, nightsweat No chronic cough unless exacerbation of copd/bronchiectasis (last time 07/2023) Lost 20 pounds over the past year since stroke but is gaining weight back Appetite so-so    #gerd Nexium   #chf  #afib EF 45%   #dm2 Mild On metformin    #concern for sleep apnea Pending sleep study   #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 Vasculitis on set 2014 resolved with pred Another flare 02/2023 also resolved with pred ?stroke sx around same time 02/2023 and right foot drop  W/u by neurology             EMG nerve conduction study confirmed moderate severe axonal sensorimotor polyneuropathy, also findings suggestive of active right sciatic neuropathy versus bilateral lumbosacral radiculopathy, right worse than left  He is followed by pulm for bronchiectasis/copd Recent flare 07/2023; steno; neg afb cx on bal Stable chest ct  Primary care concerned with chest ct and wants id eval for ntm lung   03/26/24 id clinic f/u See a&p for detail No complaint today     Review of Systems: ROS Negative unless mentioned above      Past Medical History:  Diagnosis Date   Anxiety    Aortic atherosclerosis (HCC)    Atrial fibrillation (HCC)    Bladder cancer (HCC)  Chronic systolic heart failure (HCC)    Colon polyps    COPD (chronic obstructive pulmonary disease) (HCC)    Diabetes mellitus with complication (HCC)    Diabetic peripheral neuropathy (HCC) 12/27/2020   Diverticulosis    Essential hypertension    GERD  (gastroesophageal reflux disease)    History of CVA with residual deficit    R foot drop   History of pneumonia    Nephrolithiasis    OSA (obstructive sleep apnea)    Osteoarthritis    Pulmonary nodule    surveillance by Dr. 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   SCC and BCC face and hand, needs mohs    Social History   Tobacco Use   Smoking status: Former    Current packs/day: 0.00    Average packs/day: 1 pack/day for 30.0 years (30.0 ttl pk-yrs)    Types: Cigarettes    Start date: 07/06/1981    Quit date: 07/07/2011    Years since quitting: 12.7   Smokeless tobacco: Never  Vaping Use   Vaping status: Never Used  Substance Use Topics   Alcohol  use: No    Alcohol /week: 0.0 standard drinks of alcohol    Drug use: No    Family History  Problem Relation Age of Onset   Diabetes Mother    CAD Mother    Emphysema Father    Diabetes Sister    Stroke Sister    Lung cancer Brother    Liver cancer Brother    Colon cancer Neg Hx     Allergies  Allergen Reactions   Simvastatin Rash   Cortisone Nausea And Vomiting   Albuterol  Palpitations and Other (See Comments)    Pt is okay to take xopenex , albuterol  raises his heart rate   Augmentin [Amoxicillin-Pot Clavulanate] Rash   Ciprofloxacin Rash   Contrast Media [Iodinated Contrast Media] Rash   Flagyl  [Metronidazole ] Rash   Latex Hives and Rash   Sulfa  Antibiotics Rash    OBJECTIVE: Vitals:   03/26/24 0946 03/26/24 0948  BP:  137/70  Pulse:  (!) 58  Resp:  16  Temp:  (!) 97.5 F (36.4 C)  TempSrc:  Oral  SpO2:  96%  Weight: 142 lb (64.4 kg)    Body mass index is 19.8 kg/m.   Physical Exam General/constitutional: no distress, pleasant; walks with fww HEENT: Normocephalic, PER, Conj Clear, EOMI, Oropharynx clear Neck supple CV: rrr no mrg Lungs: clear to auscultation, normal respiratory effort Abd: Soft, Nontender Ext: no edema Skin: No Rash Neuro:  nonfocal; right foot flexion 3/5 MSK: no peripheral joint swelling/tenderness/warmth; back spines nontender   Lab: Lab Results  Component Value Date   WBC 10.0 10/31/2023   HGB 12.8 (L) 10/31/2023   HCT 41.2 10/31/2023   MCV 90.7 10/31/2023   PLT 314 10/31/2023   Last metabolic panel Lab Results  Component Value Date   GLUCOSE 94 03/19/2024   NA 139 03/19/2024   K 4.1 03/19/2024   CL 100 03/19/2024   CO2 31 03/19/2024   BUN 18 03/19/2024   CREATININE 0.88 03/19/2024   GFR 81.55 03/19/2024   CALCIUM  9.6 03/19/2024   PHOS 2.9 08/14/2023   PROT 6.7 10/28/2023   ALBUMIN 3.1 (L) 10/28/2023   LABGLOB 3.3 05/09/2023   LABGLOB 2.8 05/09/2023   BILITOT 1.1 10/28/2023   ALKPHOS 43 10/28/2023   AST 31 10/28/2023   ALT 12 10/28/2023   ANIONGAP  10 10/31/2023    Microbiology:  Serology:  Imaging: Reviewed  06/2023 chest ct similar to 09/2022 Bronchiectasis with mucoid impaction and extensive peribronchovascular nodularity again noted in similar to prior study. Compatible with chronic MAC.   Trace right pleural effusion.   Coronary artery disease.    12/2023 chest ct (stable) 1. New 5 mm left upper lobe nodule. 2. Stable right lower lobe superior segment nodules measuring 6 mm and 8 mm. Other sub-5 mm peribronchovascular nodularity bilaterally is unchanged. 3. Diffuse bronchial thickening, with bronchiectasis and multifocal subsegmental bronchial plugging, greatest in the lower lobes but also seen in the lingula and medial right middle lobe. No progression in the bronchiectasis and mucoid impactions. Findings consistent with chronic MAC. 4. Interval increased panchamber cardiomegaly with biatrial chamber predominance. 5. Aortic and coronary artery atherosclerosis. 6. Osteopenia and degenerative change.  Assessment/plan: Problem List Items Addressed This Visit   None Visit Diagnoses       Bronchiectasis without complication (HCC)    -  Primary           Patient with vasculitis/mononeuritis multiplex, copd, bronchiectasis, hx cva, chf, gerd  Patient with recent bal 06/2023 afb cx negative  He has been stable with pft for the past several years  He has had stable baseline functional status since 02/2023 stroke  No b sx or chronic cough  Imaging consistent with bronchiectasis vs ntm lung disease but currently based on data no microbiologic evidence of ntm and wouldn't meet clinical criteria   Other thing to keep in mind is gerd and also vasculitis hx. The former definitely can cause lung changes with aspiration especially with stroke hx. The latter if a systemic process can cause pulm nodules.    I would repeat chest ct within the next 4 months and see about getting surveillance afb culture. Patient don't think he can cough up any sputum. In this setting will just plan repeat ct chest and also if he has an exacerbation will plan AFB culture at that time  Follow up with me in 6 months  F/u pulm dr Jude Donning in the next several weeks -- will ask him to evaluate for non-infectious cause of ct chest changes again in setting rheumatologic history and gerd/stroke aspiration concern  ------ 03/26/24 id clinic assessment Patient clinically same/stable from my last visit several months ago He saw dr Jude 01/21/24 and I reviewed chart No b-sx Intermittent tickle on his throat; cough clear phlehm 3-4 times a day Appetite is not great though; weight actually go up 2-3 pounds last 6 months per his monitoring Noted new pulm small nodule on 12/2023 ct   Advise patient to avoid azithromycin  for bronchitis/copd exacerbation in case we need to use it for ntm if he has it. Advise use of doxycycline /augmentin   He has f/u pulm and chest ct 06/2024   Continue observation  F/u 6 months with me  Follow-up: No follow-ups on file.  Matthew ONEIDA Passer, MD [facility] 03/26/2024, 10:11 AM

## 2024-03-27 ENCOUNTER — Other Ambulatory Visit: Payer: Self-pay

## 2024-03-27 ENCOUNTER — Other Ambulatory Visit (HOSPITAL_COMMUNITY): Payer: Self-pay

## 2024-03-27 ENCOUNTER — Ambulatory Visit: Attending: Cardiology | Admitting: Nurse Practitioner

## 2024-03-27 ENCOUNTER — Other Ambulatory Visit (HOSPITAL_BASED_OUTPATIENT_CLINIC_OR_DEPARTMENT_OTHER): Payer: Self-pay

## 2024-03-27 ENCOUNTER — Other Ambulatory Visit: Payer: Self-pay | Admitting: Family Medicine

## 2024-03-27 DIAGNOSIS — I480 Paroxysmal atrial fibrillation: Secondary | ICD-10-CM

## 2024-03-27 DIAGNOSIS — Z01810 Encounter for preprocedural cardiovascular examination: Secondary | ICD-10-CM | POA: Diagnosis not present

## 2024-03-27 MED ORDER — APIXABAN 5 MG PO TABS
5.0000 mg | ORAL_TABLET | Freq: Two times a day (BID) | ORAL | 1 refills | Status: AC
  Filled 2024-03-27: qty 60, 30d supply, fill #0
  Filled 2024-04-21 – 2024-04-28 (×2): qty 60, 30d supply, fill #1
  Filled 2024-05-26: qty 60, 30d supply, fill #2
  Filled 2024-06-20: qty 60, 30d supply, fill #3
  Filled 2024-07-22: qty 60, 30d supply, fill #4

## 2024-03-27 NOTE — Progress Notes (Signed)
 Virtual Visit via Telephone Note   Because of Matthew Hall co-morbid illnesses, he is at least at moderate risk for complications without adequate follow up.  This format is felt to be most appropriate for this patient at this time.  Due to technical limitations with video connection (technology), today's appointment will be conducted as an audio only telehealth visit, and Matthew Hall verbally agreed to proceed in this manner.   All issues noted in this document were discussed and addressed.  No physical exam could be performed with this format.  Evaluation Performed:  Preoperative cardiovascular risk assessment _____________   Date:  03/27/2024   Patient ID:  Matthew Hall, DOB [birth year], MRN [id number] Patient Location:  Home Provider location:   Office  Primary Care Provider:  Candise [name], MD Primary Cardiologist:  None  Chief Complaint / Patient Profile   78 y.o. y/o male with a h/o permanent atrial fibrillation, subclinical LV dysfunction, hypertension, CVA, type 2 diabetes, and COPD  who is pending dental extraction (5 teeth) with Dr. Estelita [facility] and presents today for telephonic preoperative cardiovascular risk assessment.  History of Present Illness    Matthew Hall is a 78 y.o. male who presents via audio/video conferencing for a telehealth visit today.  Pt was last seen in cardiology clinic on 05/14/2023 by Matthew Pavy, PA. At that time Matthew Hall was doing well.  The patient is now pending procedure as outlined above. Since his last visit, he has done well from a cardiac standpoint.   He denies chest pain, palpitations, dyspnea, pnd, orthopnea, n, v, dizziness, syncope, edema, weight gain, or early satiety. All other systems reviewed and are otherwise negative except as noted above.   Past Medical History    Past Medical History:  Diagnosis Date   Anxiety    Aortic atherosclerosis  (HCC)    Atrial fibrillation (HCC)    Bladder cancer (HCC)    Chronic systolic heart failure (HCC)    Colon polyps    COPD (chronic obstructive pulmonary disease) (HCC)    Diabetes mellitus with complication (HCC)    Diabetic peripheral neuropathy (HCC) 12/27/2020   Diverticulosis    Essential hypertension    GERD (gastroesophageal reflux disease)    History of CVA with residual deficit    R foot drop   History of pneumonia    Nephrolithiasis    OSA (obstructive sleep apnea)    Osteoarthritis    Pulmonary nodule    surveillance by Dr. 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   SCC and BCC face and hand, needs mohs   Past Surgical History:  Procedure Laterality Date   BRONCHIAL WASHINGS  06/19/2023   Procedure: BRONCHIAL WASHINGS;  Surgeon: Matthew Leita SQUIBB, DO;  Location: [facility];  Service: Cardiopulmonary;;   COLONOSCOPY     Kidney stones removed  1996   SKIN CANCER EXCISION Left    2003   TRANSTHORACIC ECHOCARDIOGRAM     04/2023 EF 40-45 on echo transcription but reviewed by Dr. Vernice who felt like EF was 55 to 60% and no impairment of LV function, nl RV fxn, Mod tricusp regurg.   TRANSURETHRAL RESECTION OF BLADDER TUMOR  1996   VIDEO BRONCHOSCOPY N/A 06/19/2023   Procedure: VIDEO BRONCHOSCOPY WITHOUT FLUORO;  Surgeon: Matthew Leita SQUIBB, DO;  Location: [facility] ENDOS[facility];  Service: Cardiopulmonary;  Laterality:  N/A;    Allergies  Allergies  Allergen Reactions   Simvastatin Rash   Cortisone Nausea And Vomiting   Albuterol  Palpitations and Other (See Comments)    Pt is okay to take xopenex , albuterol  raises his heart rate   Augmentin [Amoxicillin-Pot Clavulanate] Rash   Ciprofloxacin Rash   Contrast Media [Iodinated Contrast Media] Rash   Flagyl  [Metronidazole ] Rash   Latex Hives and Rash   Sulfa  Antibiotics Rash    Home Medications    Prior to Admission medications   Medication Sig Start Date End Date Taking?  Authorizing Provider  Acetaminophen  (TYLENOL ) 325 MG CAPS Take 1-2 capsules by mouth every 6 (six) hours as needed (pain).    [provider]  ALPRAZolam  (XANAX ) 0.25 MG tablet Take 1 tablet (0.25 mg total) by mouth 2 (two) times daily as needed for anxiety or sleep. 02/08/24 05/08/24  [name], [name], MD  apixaban  (ELIQUIS ) 5 MG TABS tablet Take 1 tablet (5 mg total) by mouth 2 (two) times daily. 03/27/24   [name], [name], MD  budesonide -glycopyrrolate -formoterol  (BREZTRI  AEROSPHERE) 160-9-4.8 MCG/ACT AERO inhaler Inhale 2 puffs into the lungs 2 (two) times daily. 09/10/23   [name], [name], PA  busPIRone  (BUSPAR ) 10 MG tablet Take 1 tablet (10 mg total) by mouth 2 (two) times daily. 11/19/23   [name], [name], PA  Cyanocobalamin  (VITAMIN B12 PO) Take 1 tablet by mouth daily.    [provider]  feeding supplement (ENSURE ENLIVE / ENSURE PLUS) LIQD Take 237 mLs by mouth 2 (two) times daily between meals. 07/15/23   [name], [name], MD  guaiFENesin  (MUCINEX ) 600 MG 12 hr tablet Take 1 tablet (600 mg total) by mouth 2 (two) times daily. 10/03/23   [name], [name], PA  hydrochlorothiazide  (HYDRODIURIL ) 12.5 MG tablet Take 1 tablet (12.5 mg total) by mouth as needed. 10/03/23   [name], [name], PA  levalbuterol  (XOPENEX  HFA) 45 MCG/ACT inhaler Inhale 2 puffs into the lungs every 6 (six) hours as needed for wheezing. 10/01/23 09/30/24  [name], [name], NP  levalbuterol  (XOPENEX ) 0.63 MG/3ML nebulizer solution Take 3 mLs (0.63 mg total) by nebulization every 6 (six) hours as needed for wheezing or shortness of breath. 07/17/23   [name], [name], NP  metFORMIN  (GLUCOPHAGE -XR) 500 MG 24 hr tablet Take 1 tablet (500 mg total) by mouth daily. Patient not taking: Reported on 03/26/2024 02/14/24   [name], [name], PA  metoprolol  succinate (TOPROL -XL) 25 MG 24 hr tablet Take 1 tablet (25 mg total) by mouth 2 (two) times daily. 01/17/24   Debera Matthew MATSU, MD  omeprazole  (PRILOSEC) 20 MG capsule Take  1 capsule (20 mg total) by mouth daily. 09/10/23   [name], [name], PA  ondansetron  (ZOFRAN ) 4 MG tablet Take 1 tablet (4 mg total) by mouth 3 (three) times daily as needed for nausea/vomiting. Patient not taking: Reported on 03/26/2024 12/21/23     triamcinolone  (NASACORT ) 55 MCG/ACT AERO nasal inhaler Place 1 spray into the nose daily. 10/03/23   Lowella Benton CROME, PA    Physical Exam    Vital Signs:  Matthew Hall does not have vital signs available for review today.  Given telephonic nature of communication, physical exam is limited. AAOx3. NAD. Normal affect.  Speech and respirations are unlabored.  Accessory Clinical Findings    None  Assessment & Plan    1.  Preoperative Cardiovascular Risk Assessment:  According to the Revised Cardiac Risk Index (RCRI), his Perioperative Risk of Major Cardiac  Event is (%): 0.9. His Functional Capacity in METs is: 5.72 according to the Duke Activity Status Index (DASI).Therefore, based on ACC/AHA guidelines, patient would be at acceptable risk for the planned procedure without further cardiovascular testing.   The patient was advised that if he develops new symptoms prior to surgery to contact our office to arrange for a follow-up visit, and he verbalized understanding.  Patient will be high risk off anticoagulation and has history of CVA. Recommend 1 day Eliquis  hold. Please resume Eliquis  as soon as possible postprocedure, at the discretion of the surgeon.    Patient does not require pre-op antibiotics for dental procedure.   A copy of this note will be routed to requesting surgeon.  Time:   Today, I have spent 5 minutes with the patient with telehealth technology discussing medical history, symptoms, and management plan.     Damien Matthew Braver, NP  03/27/2024, 2:13 PM

## 2024-03-28 ENCOUNTER — Other Ambulatory Visit: Payer: Self-pay

## 2024-04-10 ENCOUNTER — Other Ambulatory Visit (HOSPITAL_COMMUNITY): Payer: Self-pay

## 2024-04-10 ENCOUNTER — Other Ambulatory Visit: Payer: Self-pay

## 2024-04-10 MED ORDER — CLINDAMYCIN HCL 150 MG PO CAPS
150.0000 mg | ORAL_CAPSULE | Freq: Four times a day (QID) | ORAL | 0 refills | Status: DC
  Filled 2024-04-10 (×2): qty 28, 7d supply, fill #0

## 2024-04-10 MED ORDER — HYDROCODONE-ACETAMINOPHEN 5-325 MG PO TABS
1.0000 | ORAL_TABLET | ORAL | 0 refills | Status: DC | PRN
  Filled 2024-04-10 (×2): qty 14, 3d supply, fill #0

## 2024-04-11 ENCOUNTER — Other Ambulatory Visit (HOSPITAL_COMMUNITY): Payer: Self-pay

## 2024-04-11 ENCOUNTER — Telehealth: Payer: Self-pay

## 2024-04-11 NOTE — Telephone Encounter (Signed)
Placed on PCP desk to review and sign, if appropriate.  

## 2024-04-11 NOTE — Telephone Encounter (Signed)
 Received fax from [facility] @ [address] Evaluation and Tx

## 2024-04-17 ENCOUNTER — Telehealth: Payer: Self-pay | Admitting: Cardiology

## 2024-04-17 ENCOUNTER — Ambulatory Visit: Payer: Self-pay

## 2024-04-17 NOTE — Telephone Encounter (Signed)
 Recommendations given to pts daughter. Pt scheduled for OV tomorrow due to possible infection.

## 2024-04-17 NOTE — Telephone Encounter (Signed)
 Pt c/o medication issue:  1. Name of Medication:  Eliquis    2. How are you currently taking this medication (dosage and times per day)?   3. Are you having a reaction (difficulty breathing--STAT)?   4. What is your medication issue?   See pre-op encounter. Daughter says after having 5 extractions patient resumed Eliquis  and developed a liver clot. She says it was cauterized yesterday. It started oozing blood last night. They called oral surgeon and were advised to contact cardiology regarding benefits for risk of holding Eliquis ? Daughter says they do not suspect infection but advised to follow up just incase. Daughter is very worried. She mentions that normal baseline for HR is 59, but it has been 53 recently. Please advise.

## 2024-04-17 NOTE — Telephone Encounter (Signed)
 It is best to go ahead and restart his anxiety medication at this time, even though he will be taking pain medication.

## 2024-04-17 NOTE — Telephone Encounter (Signed)
 Daughter called for patient who had a dental procedure done five days ago. Daughter is concerned for fluctuating blood pressures and change in pulse. Most recent blood pressure was last night 186/102 which came down to 156/99 after taking his blood pressure medication. Daughter states HR is running 50-52 which she is thinking may have to do with pain medication he is on. Daughter states patient was told to not take his anxiety medications while on pain medication and this has made his anxiety increased. Daughter states patient is getting anxious about anything new. Daughter is asking for recommendations from provider. Requesting a call back to [phone]  FYI Only or Action Required?: Action required by provider: clinical question for provider.  Patient was last seen in primary care on 03/21/2024 by Catherine Fuller A, DO.  Called Nurse Triage reporting Hypertension.  Symptoms began several days ago.  Interventions attempted: Rest, hydration, or home remedies.  Symptoms are: unchanged.  Triage Disposition: See PCP When Office is Open (Within 3 Days)  Patient/caregiver understands and will follow disposition?: No, wishes to speak with PCP  Copied from CRM #[id number]. Topic: Clinical - Red Word Triage >> Apr 17, 2024  8:43 AM Franky GRADE wrote: Red Word that prompted transfer to Nurse Triage: Patient's daughter is call because patient recently had oral surgery and she is concerned because for the last 5 days patient's experiencing fluctuating blood pressure and change in pulse. Reason for Disposition  Systolic BP >= 160 OR Diastolic >= 100  Answer Assessment - Initial Assessment Questions 1. BLOOD PRESSURE: What is your blood pressure? Did you take at least two measurements 5 minutes apart?     186/102-took PRN blood pressure medication.  after medication BP was 156/99 2. ONSET: When did you take your blood pressure?     yesterday 3. HOW: How did you take your blood pressure?  (e.g., automatic home BP monitor, visiting nurse)     Automatic home BP monitor 4. HISTORY: Do you have a history of high blood pressure?     yes 5. MEDICINES: Are you taking any medicines for blood pressure? Have you missed any doses recently?     Yes-no missed doses 6. OTHER SYMPTOMS: Do you have any symptoms? (e.g., blurred vision, chest pain, difficulty breathing, headache, weakness)     headache  Protocols used: Blood Pressure - High-A-AH

## 2024-04-17 NOTE — Telephone Encounter (Signed)
 Please Advise on medication recommendations

## 2024-04-18 ENCOUNTER — Ambulatory Visit (INDEPENDENT_AMBULATORY_CARE_PROVIDER_SITE_OTHER): Admitting: Family

## 2024-04-18 ENCOUNTER — Encounter: Payer: Self-pay | Admitting: Family

## 2024-04-18 ENCOUNTER — Other Ambulatory Visit (HOSPITAL_BASED_OUTPATIENT_CLINIC_OR_DEPARTMENT_OTHER): Payer: Self-pay

## 2024-04-18 VITALS — BP 129/69 | HR 61 | Temp 98.0°F | Wt 142.0 lb

## 2024-04-18 DIAGNOSIS — R5383 Other fatigue: Secondary | ICD-10-CM | POA: Diagnosis not present

## 2024-04-18 DIAGNOSIS — R350 Frequency of micturition: Secondary | ICD-10-CM | POA: Diagnosis not present

## 2024-04-18 DIAGNOSIS — R110 Nausea: Secondary | ICD-10-CM

## 2024-04-18 LAB — CBC WITH DIFFERENTIAL/PLATELET
Basophils Absolute: 0.1 K/uL (ref 0.0–0.1)
Basophils Relative: 0.7 % (ref 0.0–3.0)
Eosinophils Absolute: 0.2 K/uL (ref 0.0–0.7)
Eosinophils Relative: 2.2 % (ref 0.0–5.0)
HCT: 39.9 % (ref 39.0–52.0)
Hemoglobin: 12.9 g/dL — ABNORMAL LOW (ref 13.0–17.0)
Lymphocytes Relative: 11.4 % — ABNORMAL LOW (ref 12.0–46.0)
Lymphs Abs: 1.2 K/uL (ref 0.7–4.0)
MCHC: 32.4 g/dL (ref 30.0–36.0)
MCV: 88 fl (ref 78.0–100.0)
Monocytes Absolute: 1.6 K/uL — ABNORMAL HIGH (ref 0.1–1.0)
Monocytes Relative: 14.7 % — ABNORMAL HIGH (ref 3.0–12.0)
Neutro Abs: 7.7 K/uL (ref 1.4–7.7)
Neutrophils Relative %: 71 % (ref 43.0–77.0)
Platelets: 374 K/uL (ref 150.0–400.0)
RBC: 4.54 Mil/uL (ref 4.22–5.81)
RDW: 15.2 % (ref 11.5–15.5)
WBC: 10.8 K/uL — ABNORMAL HIGH (ref 4.0–10.5)

## 2024-04-18 LAB — POCT URINALYSIS DIPSTICK
Blood, UA: NEGATIVE
Glucose, UA: NEGATIVE
Ketones, UA: NEGATIVE
Leukocytes, UA: NEGATIVE
Nitrite, UA: NEGATIVE
Protein, UA: NEGATIVE
Spec Grav, UA: 1.02 (ref 1.010–1.025)
Urobilinogen, UA: 0.2 U/dL
pH, UA: 5.5 (ref 5.0–8.0)

## 2024-04-18 LAB — COMPREHENSIVE METABOLIC PANEL WITH GFR
ALT: 12 U/L (ref 0–53)
AST: 11 U/L (ref 0–37)
Albumin: 3.8 g/dL (ref 3.5–5.2)
Alkaline Phosphatase: 67 U/L (ref 39–117)
BUN: 13 mg/dL (ref 6–23)
CO2: 33 meq/L — ABNORMAL HIGH (ref 19–32)
Calcium: 9.3 mg/dL (ref 8.4–10.5)
Chloride: 94 meq/L — ABNORMAL LOW (ref 96–112)
Creatinine, Ser: 0.86 mg/dL (ref 0.40–1.50)
GFR: 82.07 mL/min (ref >60.00)
Glucose, Bld: 114 mg/dL — ABNORMAL HIGH (ref 70–99)
Potassium: 4.1 meq/L (ref 3.5–5.1)
Sodium: 135 meq/L (ref 135–145)
Total Bilirubin: 0.8 mg/dL (ref 0.2–1.2)
Total Protein: 7.1 g/dL (ref 6.0–8.3)

## 2024-04-18 NOTE — Progress Notes (Signed)
 Acute Office Visit  Subjective:     Patient ID: Matthew Hall, male    DOB: [birth year], 78 y.o.   MRN: [id number]  Chief Complaint  Patient presents with   Headache    Had 5 teeth pulled last week has been in pain since; just finish Abx this morning.   Fatigue    HPI Patient is in today accompanied by his daughter with concerns of nausea, fatigue over the last several days.  Patient had 5 teeth pulled last week and had increased pain and was started on clindamycin.  He just took the last dose today. He is still under the care of his dentist. Daughter is concerned that he s not eating well and having nausea and fatigue.   He also reports urinary frequency that is worse at night. No burning. No back pain or abdominal pain.   Review of Systems  Constitutional:  Positive for malaise/fatigue.  Gastrointestinal:  Positive for nausea. Negative for vomiting.  Genitourinary:  Positive for frequency.  All other systems reviewed and are negative.  Past Medical History:  Diagnosis Date   Anxiety    Aortic atherosclerosis    Atrial fibrillation (HCC)    Bladder cancer (HCC)    Chronic systolic heart failure (HCC)    Colon polyps    COPD (chronic obstructive pulmonary disease) (HCC)    Diabetes mellitus with complication (HCC)    Diabetic peripheral neuropathy (HCC) 12/27/2020   Diverticulosis    Essential hypertension    GERD (gastroesophageal reflux disease)    History of CVA with residual deficit    R foot drop   History of pneumonia    Nephrolithiasis    OSA (obstructive sleep apnea)    Osteoarthritis    Pulmonary nodule    surveillance by Dr. 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   SCC and BCC face and hand, needs mohs    Social History   Socioeconomic History   Marital status: Married    Spouse name: Not on file   Number of children: 3   Years of education: Not on file   Highest education level: Not on file   Occupational History   Occupation: Pipefilter/retired    Comment: [employer]  Tobacco Use   Smoking status: Former    Current packs/day: 0.00    Average packs/day: 1 pack/day for 30.0 years (30.0 ttl pk-yrs)    Types: Cigarettes    Start date: 07/06/1981    Quit date: 07/07/2011    Years since quitting: 12.7   Smokeless tobacco: Never  Vaping Use   Vaping status: Never Used  Substance and Sexual Activity   Alcohol  use: No    Alcohol /week: 0.0 standard drinks of alcohol    Drug use: No   Sexual activity: Not Currently  Other Topics Concern   Not on file  Social History Narrative   Originally from [place] .   Lives with wife.     Retired Consulting civil engineer.   He does not drive due to foot drop.   25-pack-year smoking history, quit 2013.   Caffeine- 1 cup a day   Right handed    Social Drivers of Health   Financial Resource Strain: Not on file  Food Insecurity: No Food Insecurity (10/29/2023)   Hunger Vital Sign    Worried About Running Out of Food in the Last Year: Never true    Ran Out of Food in  the Last Year: Never true  Transportation Needs: No Transportation Needs (10/29/2023)   PRAPARE - Administrator, Civil Service (Medical): No    Lack of Transportation (Non-Medical): No  Physical Activity: Not on file  Stress: Not on file  Social Connections: Moderately Isolated (10/29/2023)   Social Connection and Isolation Panel    Frequency of Communication with Friends and Family: More than three times a week    Frequency of Social Gatherings with Friends and Family: Once a week    Attends Religious Services: Never    Database administrator or Organizations: No    Attends Banker Meetings: Never    Marital Status: Married  Catering manager Violence: Not At Risk (10/29/2023)   Humiliation, Afraid, Rape, and Kick questionnaire    Fear of Current or Ex-Partner: No    Emotionally Abused: No    Physically Abused: No    Sexually Abused: No     Past Surgical History:  Procedure Laterality Date   BRONCHIAL WASHINGS  06/19/2023   Procedure: BRONCHIAL WASHINGS;  Surgeon: Gretta Leita SQUIBB, DO;  Location: [facility];  Service: Cardiopulmonary;;   COLONOSCOPY     Kidney stones removed  1996   SKIN CANCER EXCISION Left    2003   TRANSTHORACIC ECHOCARDIOGRAM     04/2023 EF 40-45 on echo transcription but reviewed by Dr. Vernice who felt like EF was 55 to 60% and no impairment of LV function, nl RV fxn, Mod tricusp regurg.   TRANSURETHRAL RESECTION OF BLADDER TUMOR  1996   VIDEO BRONCHOSCOPY N/A 06/19/2023   Procedure: VIDEO BRONCHOSCOPY WITHOUT FLUORO;  Surgeon: Gretta Leita SQUIBB, DO;  Location: [facility];  Service: Cardiopulmonary;  Laterality: N/A;    Family History  Problem Relation Age of Onset   Diabetes Mother    CAD Mother    Emphysema Father    Diabetes Sister    Stroke Sister    Lung cancer Brother    Liver cancer Brother    Colon cancer Neg Hx     Allergies  Allergen Reactions   Simvastatin Rash   Cortisone Nausea And Vomiting   Albuterol  Palpitations and Other (See Comments)    Pt is okay to take xopenex , albuterol  raises his heart rate   Augmentin [Amoxicillin-Pot Clavulanate] Rash   Ciprofloxacin Rash   Contrast Media [Iodinated Contrast Media] Rash   Flagyl  [Metronidazole ] Rash   Latex Hives and Rash   Sulfa  Antibiotics Rash    Current Outpatient Medications on File Prior to Visit  Medication Sig Dispense Refill   Acetaminophen  (TYLENOL ) 325 MG CAPS Take 1-2 capsules by mouth every 6 (six) hours as needed (pain).     ALPRAZolam  (XANAX ) 0.25 MG tablet Take 1 tablet (0.25 mg total) by mouth 2 (two) times daily as needed for anxiety or sleep. 90 tablet 0   apixaban  (ELIQUIS ) 5 MG TABS tablet Take 1 tablet (5 mg total) by mouth 2 (two) times daily. 180 tablet 1   budesonide -glycopyrrolate -formoterol  (BREZTRI  AEROSPHERE) 160-9-4.8 MCG/ACT AERO inhaler Inhale 2 puffs into the lungs 2 (two) times daily.  10.7 g 11   busPIRone  (BUSPAR ) 10 MG tablet Take 1 tablet (10 mg total) by mouth 2 (two) times daily. 180 tablet 1   Cyanocobalamin  (VITAMIN B12 PO) Take 1 tablet by mouth daily.     feeding supplement (ENSURE ENLIVE / ENSURE PLUS) LIQD Take 237 mLs by mouth 2 (two) times daily between meals.     guaiFENesin  (MUCINEX ) 600 MG  12 hr tablet Take 1 tablet (600 mg total) by mouth 2 (two) times daily. 60 tablet 5   hydrochlorothiazide  (HYDRODIURIL ) 12.5 MG tablet Take 1 tablet (12.5 mg total) by mouth as needed. 30 tablet 3   HYDROcodone -acetaminophen  (NORCO/VICODIN) 5-325 MG tablet Take 1 tablet by mouth every 4 (four) to 6 (six) hours as needed for pain. (Patient taking differently: Take 0.5 tablets by mouth every 4 (four) hours as needed.) 14 tablet 0   levalbuterol  (XOPENEX  HFA) 45 MCG/ACT inhaler Inhale 2 puffs into the lungs every 6 (six) hours as needed for wheezing. 15 g 3   levalbuterol  (XOPENEX ) 0.63 MG/3ML nebulizer solution Take 3 mLs (0.63 mg total) by nebulization every 6 (six) hours as needed for wheezing or shortness of breath. 270 mL 5   metoprolol  succinate (TOPROL -XL) 25 MG 24 hr tablet Take 1 tablet (25 mg total) by mouth 2 (two) times daily. 180 tablet 2   omeprazole  (PRILOSEC) 20 MG capsule Take 1 capsule (20 mg total) by mouth daily. 90 capsule 2   ondansetron  (ZOFRAN ) 4 MG tablet Take 1 tablet (4 mg total) by mouth 3 (three) times daily as needed for nausea/vomiting. 30 tablet 1   triamcinolone  (NASACORT ) 55 MCG/ACT AERO nasal inhaler Place 1 spray into the nose daily. 16.9 mL 5   clindamycin (CLEOCIN) 150 MG capsule Take 1 capsule (150 mg total) by mouth 4 (four) times daily until complete (Patient not taking: Reported on 04/18/2024) 28 capsule 0   metFORMIN  (GLUCOPHAGE -XR) 500 MG 24 hr tablet Take 1 tablet (500 mg total) by mouth daily. (Patient not taking: Reported on 04/18/2024) 90 tablet 3   No current facility-administered medications on file prior to visit.    BP 129/69    Pulse 61   Temp 98 F (36.7 C)   Wt 142 lb (64.4 kg)   SpO2 95%   BMI 19.80 kg/m chart      Objective:    BP 129/69   Pulse 61   Temp 98 F (36.7 C)   Wt 142 lb (64.4 kg)   SpO2 95%   BMI 19.80 kg/m    Physical Exam Vitals and nursing note reviewed.  Constitutional:      Appearance: He is well-developed and normal weight.  Cardiovascular:     Rate and Rhythm: Normal rate and regular rhythm.  Pulmonary:     Effort: Pulmonary effort is normal.     Breath sounds: Normal breath sounds.  Abdominal:     General: Bowel sounds are normal.     Palpations: Abdomen is soft.  Skin:    General: Skin is warm and dry.  Neurological:     Mental Status: He is alert.  Psychiatric:        Mood and Affect: Mood normal.        Speech: Speech normal.        Behavior: Behavior normal.     Comments: Appears drowsy     No results found for any visits on 04/18/24.      Assessment & Plan:   Problem List Items Addressed This Visit   None Visit Diagnoses       Other fatigue    -  Primary   Relevant Orders   CBC w/Diff   Comp Met (CMET)     Nausea       Relevant Orders   CBC w/Diff   Comp Met (CMET)     Urinary frequency       Relevant Orders  Comp Met (CMET)   POC Urinalysis Dipstick   Urine Culture      Symptoms will likely improve as he has taken the last dose of clindamycin. Will obtain labs to ensure he does not have an electrolyte imbalance or abnormal white count.  Will follow-up pending results.  In the meantime rest.  Drink plenty of fluids.  Call the office if symptoms worsen or persist.  Follow-up with a dentist as scheduled.  No orders of the defined types were placed in this encounter.   No follow-ups on file.  Donella Pascarella B Kassidy Frankson, FNP

## 2024-04-20 LAB — URINE CULTURE
MICRO NUMBER:: 17086088
Result:: NO GROWTH
SPECIMEN QUALITY:: ADEQUATE

## 2024-04-21 ENCOUNTER — Other Ambulatory Visit: Payer: Self-pay

## 2024-04-21 ENCOUNTER — Ambulatory Visit: Payer: Self-pay | Admitting: Family

## 2024-04-21 ENCOUNTER — Other Ambulatory Visit: Payer: Self-pay | Admitting: Urgent Care

## 2024-04-21 DIAGNOSIS — R052 Subacute cough: Secondary | ICD-10-CM

## 2024-04-21 MED FILL — Metformin HCl Tab ER 24HR 500 MG: ORAL | 30 days supply | Qty: 30 | Fill #2 | Status: CN

## 2024-04-22 ENCOUNTER — Other Ambulatory Visit: Payer: Self-pay

## 2024-04-23 ENCOUNTER — Other Ambulatory Visit: Payer: Self-pay

## 2024-04-23 ENCOUNTER — Telehealth: Payer: Self-pay

## 2024-04-23 NOTE — Telephone Encounter (Signed)
 Received fax from [facility] S[facility] Eval and Plan of Treatment Certification 04/22/24 to 07/14/24  [name] in box front office

## 2024-04-24 ENCOUNTER — Ambulatory Visit: Payer: Self-pay | Admitting: Family Medicine

## 2024-04-24 NOTE — Telephone Encounter (Signed)
 FYI Only or Action Required?: Action required by provider: update on patient condition.  Patient was last seen in primary care on 04/18/2024 by Douglass Kenney NOVAK, FNP.  Called Nurse Triage reporting Constipation.  Symptoms began several days ago.  Interventions attempted: OTC medications: Dulcolax.  Symptoms are: gradually worsening.  Triage Disposition: See Physician Within 4 Hours (or PCP Triage) (overriding See Physician Within 24 Hours)  Patient/caregiver understands and will follow disposition?: Yes        Copied from CRM #[id number]. Topic: Clinical - Red Word Triage >> Apr 24, 2024  8:28 AM Thersia BROCKS wrote: Red Word that prompted transfer to Nurse Triage: Patient daughter, Rosina called in stated patient stated he wants to go to the ER, stated he came in for oral surgery , stated he has cramping and nausea and hasnt had a bowel movement , last bowel movement was 3 - 4 days wanted to know what he needs to do Reason for Disposition  Last bowel movement (BM) > 4 days ago  Answer Assessment - Initial Assessment Questions Spoke with pt's daughter, Rosina. Pt called Rosina this morning stating he wants to go to ED. Pt daughter is not sure if this is necessary. Pt lives in an independent living facility and has an Charity fundraiser who can come perform a physical assessment on pt. The daughter is going to have the RN physically assess pt and make a decision if pt needs to go to ED.  Symptoms: Last BM 3-4 days ago; took Dulcolax chewables + castor oil This AM: abdominal cramping, nausea, feels urge to stool but can't; denies vomiting Pt daughter is not sure if he is passing gas, has stomach pain, or has abdominal bloating   Background: Pt had oral surgery 2 wks ago; completed Clindamycin last Thurs and has an upset GI system; refused probiotics On narcotics for pain (now avoiding); using Zofran  PRN Hx anxiety; A-fib and CHF Oral surgeon rechecked mouth Mon - no infection  Disposition: Advised  ED evaluation today if pt has the below symptoms:  Worsening/severe abdominal pain or swelling Vomiting or unable to keep fluids down No gas passage Chest pain, palpitations, or SOB  Daughter verbalized understanding and agrees with plan  Protocols used: Constipation-A-AH

## 2024-04-24 NOTE — Telephone Encounter (Signed)
 noted

## 2024-04-25 ENCOUNTER — Other Ambulatory Visit: Payer: Self-pay

## 2024-04-25 ENCOUNTER — Other Ambulatory Visit: Payer: Self-pay | Admitting: Urgent Care

## 2024-04-25 DIAGNOSIS — R052 Subacute cough: Secondary | ICD-10-CM

## 2024-04-25 NOTE — Telephone Encounter (Signed)
 LM for daughter to return call regarding if labs needed. See provider response below

## 2024-04-25 NOTE — Telephone Encounter (Signed)
 Pt is doing better and has had a BM since. He is weak right now due to not eating much. Daughter would like to know if he needs to do any labs to f/u from this issue

## 2024-04-25 NOTE — Telephone Encounter (Signed)
 I am glad he is doing better. No labs needed.

## 2024-04-25 NOTE — Telephone Encounter (Signed)
 Pt's daughter advised of provider recommendations

## 2024-04-28 ENCOUNTER — Other Ambulatory Visit: Payer: Self-pay

## 2024-04-28 MED FILL — Metformin HCl Tab ER 24HR 500 MG: ORAL | 30 days supply | Qty: 30 | Fill #2 | Status: AC

## 2024-04-29 ENCOUNTER — Other Ambulatory Visit: Payer: Self-pay | Admitting: Family Medicine

## 2024-04-29 ENCOUNTER — Other Ambulatory Visit: Payer: Self-pay

## 2024-04-29 ENCOUNTER — Other Ambulatory Visit (HOSPITAL_COMMUNITY): Payer: Self-pay

## 2024-04-29 DIAGNOSIS — R052 Subacute cough: Secondary | ICD-10-CM

## 2024-04-30 ENCOUNTER — Emergency Department (HOSPITAL_BASED_OUTPATIENT_CLINIC_OR_DEPARTMENT_OTHER)

## 2024-04-30 ENCOUNTER — Other Ambulatory Visit (HOSPITAL_COMMUNITY): Payer: Self-pay

## 2024-04-30 ENCOUNTER — Ambulatory Visit
Admission: RE | Admit: 2024-04-30 | Discharge: 2024-04-30 | Disposition: A | Discharge status: Other Acute Inpatient Hospital | Source: Ambulatory Visit | Attending: Family Medicine | Admitting: Family Medicine

## 2024-04-30 ENCOUNTER — Emergency Department (HOSPITAL_BASED_OUTPATIENT_CLINIC_OR_DEPARTMENT_OTHER)
Admission: EM | Admit: 2024-04-30 | Discharge: 2024-05-01 | Disposition: A | Discharge status: Home / Self Care | Source: Ambulatory Visit | Attending: Emergency Medicine | Admitting: Emergency Medicine

## 2024-04-30 ENCOUNTER — Other Ambulatory Visit: Payer: Self-pay

## 2024-04-30 ENCOUNTER — Encounter (HOSPITAL_BASED_OUTPATIENT_CLINIC_OR_DEPARTMENT_OTHER): Payer: Self-pay

## 2024-04-30 VITALS — BP 90/56 | HR 76 | Temp 97.5°F | Resp 22

## 2024-04-30 DIAGNOSIS — R059 Cough, unspecified: Secondary | ICD-10-CM | POA: Insufficient documentation

## 2024-04-30 DIAGNOSIS — Z7901 Long term (current) use of anticoagulants: Secondary | ICD-10-CM | POA: Diagnosis not present

## 2024-04-30 DIAGNOSIS — E860 Dehydration: Secondary | ICD-10-CM | POA: Insufficient documentation

## 2024-04-30 DIAGNOSIS — D72829 Elevated white blood cell count, unspecified: Secondary | ICD-10-CM | POA: Insufficient documentation

## 2024-04-30 DIAGNOSIS — Z79899 Other long term (current) drug therapy: Secondary | ICD-10-CM | POA: Insufficient documentation

## 2024-04-30 DIAGNOSIS — Z91040 Latex allergy status: Secondary | ICD-10-CM | POA: Insufficient documentation

## 2024-04-30 DIAGNOSIS — R5383 Other fatigue: Secondary | ICD-10-CM | POA: Diagnosis not present

## 2024-04-30 DIAGNOSIS — J479 Bronchiectasis, uncomplicated: Secondary | ICD-10-CM | POA: Diagnosis not present

## 2024-04-30 DIAGNOSIS — R531 Weakness: Secondary | ICD-10-CM | POA: Diagnosis not present

## 2024-04-30 LAB — COMPREHENSIVE METABOLIC PANEL WITH GFR
ALT: 39 U/L (ref 0–44)
AST: 50 U/L — ABNORMAL HIGH (ref 15–41)
Albumin: 3.8 g/dL (ref 3.5–5.0)
Alkaline Phosphatase: 88 U/L (ref 38–126)
Anion gap: 11 (ref 5–15)
BUN: 15 mg/dL (ref 8–23)
CO2: 30 mmol/L (ref 22–32)
Calcium: 9.9 mg/dL (ref 8.9–10.3)
Chloride: 95 mmol/L — ABNORMAL LOW (ref 98–111)
Creatinine, Ser: 0.89 mg/dL (ref 0.61–1.24)
GFR, Estimated: >60 mL/min (ref >60)
Glucose, Bld: 110 mg/dL — ABNORMAL HIGH (ref 70–99)
Potassium: 3.2 mmol/L — ABNORMAL LOW (ref 3.5–5.1)
Sodium: 136 mmol/L (ref 135–145)
Total Bilirubin: 0.7 mg/dL (ref 0.0–1.2)
Total Protein: 8.2 g/dL — ABNORMAL HIGH (ref 6.5–8.1)

## 2024-04-30 LAB — URINALYSIS, W/ REFLEX TO CULTURE (INFECTION SUSPECTED)
Bacteria, UA: NONE SEEN
Bilirubin Urine: NEGATIVE
Glucose, UA: NEGATIVE mg/dL
Hgb urine dipstick: NEGATIVE
Ketones, ur: NEGATIVE mg/dL
Leukocytes,Ua: NEGATIVE
Nitrite: NEGATIVE
Protein, ur: 30 mg/dL — AB
Specific Gravity, Urine: 1.033 — ABNORMAL HIGH (ref 1.005–1.030)
pH: 6 (ref 5.0–8.0)

## 2024-04-30 LAB — CBC WITH DIFFERENTIAL/PLATELET
Abs Immature Granulocytes: 0.16 K/uL — ABNORMAL HIGH (ref 0.00–0.07)
Basophils Absolute: 0.1 K/uL (ref 0.0–0.1)
Basophils Relative: 1 %
Eosinophils Absolute: 0.4 K/uL (ref 0.0–0.5)
Eosinophils Relative: 3 %
HCT: 41.2 % (ref 39.0–52.0)
Hemoglobin: 13.2 g/dL (ref 13.0–17.0)
Immature Granulocytes: 1 %
Lymphocytes Relative: 10 %
Lymphs Abs: 1.5 K/uL (ref 0.7–4.0)
MCH: 28.7 pg (ref 26.0–34.0)
MCHC: 32 g/dL (ref 30.0–36.0)
MCV: 89.6 fL (ref 80.0–100.0)
Monocytes Absolute: 1.7 K/uL — ABNORMAL HIGH (ref 0.1–1.0)
Monocytes Relative: 12 %
Neutro Abs: 10.7 K/uL — ABNORMAL HIGH (ref 1.7–7.7)
Neutrophils Relative %: 73 %
Platelets: 536 K/uL — ABNORMAL HIGH (ref 150–400)
RBC: 4.6 MIL/uL (ref 4.22–5.81)
RDW: 14.3 % (ref 11.5–15.5)
WBC: 14.5 K/uL — ABNORMAL HIGH (ref 4.0–10.5)
nRBC: 0 % (ref 0.0–0.2)

## 2024-04-30 LAB — POCT URINE DIPSTICK
Glucose, UA: NEGATIVE mg/dL
Ketones, POC UA: NEGATIVE mg/dL
Leukocytes, UA: NEGATIVE
Nitrite, UA: NEGATIVE
POC PROTEIN,UA: 100 — AB
Spec Grav, UA: 1.02 (ref 1.010–1.025)
Urobilinogen, UA: 1 U/dL
pH, UA: 6 (ref 5.0–8.0)

## 2024-04-30 LAB — RESP PANEL BY RT-PCR (RSV, FLU A&B, COVID)  RVPGX2
Influenza A by PCR: NEGATIVE
Influenza B by PCR: NEGATIVE
Resp Syncytial Virus by PCR: NEGATIVE
SARS Coronavirus 2 by RT PCR: NEGATIVE

## 2024-04-30 LAB — LACTIC ACID, PLASMA: Lactic Acid, Venous: 1.2 mmol/L (ref 0.5–1.9)

## 2024-04-30 LAB — POC COVID19/FLU A&B COMBO
Covid Antigen, POC: NEGATIVE
Influenza A Antigen, POC: NEGATIVE
Influenza B Antigen, POC: NEGATIVE

## 2024-04-30 MED ORDER — SODIUM CHLORIDE 0.9 % IV BOLUS
1000.0000 mL | Freq: Once | INTRAVENOUS | Status: AC
  Administered 2024-04-30: 1000 mL via INTRAVENOUS

## 2024-04-30 NOTE — ED Notes (Signed)
 Patient is being discharged from the Urgent Care and sent to the Emergency Department via POV . Per Myla Bold, NP, patient is in need of higher level of care due to needing further work up and testing. Patient is aware and verbalizes understanding of plan of care.  Vitals:   04/30/24 1752  BP: (!) 90/56  Pulse: 76  Resp: (!) 22  Temp: (!) 97.5 F (36.4 C)  SpO2: 93%

## 2024-04-30 NOTE — ED Triage Notes (Signed)
 This RN reached out to pt daughter. Pt daughter reports pt had teeth pulled on 05OCT and placed on pain meds. Pt became very constipated and did not eat. Pt lost 5-10 lbs. Pt seen by PCP and had labs drawn, but nothing noted. Pt is in independent living but has CNA that checks on them. PT finally had BM on 17OCT. Pt became increasingly confused on 18OCT. Pt daughter reports today he has decreased energy, more anxious and confused.

## 2024-04-30 NOTE — ED Provider Notes (Signed)
 Care assumed at shift change. Patient here with generalized weakness after recent dental extraction with possible opioid constipation and/or dehydration. Pending UA and re-assessment after IVF.  Physical Exam  BP (!) 134/54   Pulse 74   Temp 98.1 F (36.7 C) (Oral)   Resp (!) 21   Ht 5' 11 (1.803 m)   Wt 62.6 kg   SpO2 96%   BMI 19.25 kg/m   Physical Exam  Procedures  Procedures  ED Course / MDM   Clinical Course as of 05/01/24 0043  Wed Apr 30, 2024  2355 UA is concentrated, but no signs of infection.  [CS]  Thu May 01, 2024  9957 Patient feeling better, able to drink water, and eager to return home. Recommend oral hydration, bowel regimen to prevent constipation and PCP follow up, RTED for any other concerns.   [CS]    Clinical Course User Index [CS] Roselyn Carlin NOVAK, MD   Medical Decision Making Problems Addressed: Dehydration: acute illness or injury  Amount and/or Complexity of Data Reviewed Labs:  Decision-making details documented in ED Course.          Roselyn Carlin NOVAK, MD 05/01/24 [phone]

## 2024-04-30 NOTE — ED Triage Notes (Signed)
 PT reports having multiple teeth pulled a few months back. Pt reports decreased appetite and increased weakness. Pt daughter contact info: [phone]. Asking to be contacted.

## 2024-04-30 NOTE — Discharge Instructions (Signed)
 Please go to the emergency room for further evaluation of your symptoms

## 2024-04-30 NOTE — ED Triage Notes (Signed)
 Pt states had 5 teeth pulled 1.5 wks ago and since then he has had HA, gum pain, not eating due to sore mouth, fatigue.

## 2024-04-30 NOTE — ED Provider Notes (Signed)
 [facility] AT [facility] Provider Note   CSN: [id number] Arrival date & time: 04/30/24  1941     Patient presents with: Weakness   Matthew Hall is a 78 y.o. male.   58 yoM with a chief complaints of not feeling well.  The patient had multiple teeth extracted about 5 days ago.  Since then has not really been able to eat and drink very well.  Has started to do a little bit better at home over the past 24 hours but felt very fatigued was evaluated by a nurse at his skilled nursing facility and thought to be dehydrated and sent to urgent care for fluids.  He was seen there and then referred here for lab testing and rehydration.  Patient states he has been coughing a little bit.  He denies any fevers.  Denies abdominal pain.  Denies nausea or vomiting.   Weakness      Prior to Admission medications   Medication Sig Start Date End Date Taking? Authorizing Provider  Acetaminophen  (TYLENOL ) 325 MG CAPS Take 1-2 capsules by mouth every 6 (six) hours as needed (pain).    [provider]  ALPRAZolam  (XANAX ) 0.25 MG tablet Take 1 tablet (0.25 mg total) by mouth 2 (two) times daily as needed for anxiety or sleep. 02/08/24 06/02/24  McGowenAleene DEL, MD  apixaban  (ELIQUIS ) 5 MG TABS tablet Take 1 tablet (5 mg total) by mouth 2 (two) times daily. 03/27/24   [name], [name], MD  budesonide -glycopyrrolate -formoterol  (BREZTRI  AEROSPHERE) 160-9-4.8 MCG/ACT AERO inhaler Inhale 2 puffs into the lungs 2 (two) times daily. 09/10/23   [name], [name], PA  busPIRone  (BUSPAR ) 10 MG tablet Take 1 tablet (10 mg total) by mouth 2 (two) times daily. 11/19/23   [name], [name], PA  clindamycin (CLEOCIN) 150 MG capsule Take 1 capsule (150 mg total) by mouth 4 (four) times daily until complete Patient not taking: Reported on 04/18/2024 04/10/24     Cyanocobalamin  (VITAMIN B12 PO) Take 1 tablet by mouth daily.    [provider]  feeding supplement (ENSURE ENLIVE  / ENSURE PLUS) LIQD Take 237 mLs by mouth 2 (two) times daily between meals. 07/15/23   Hall, [name], MD  guaiFENesin  (MUCINEX ) 600 MG 12 hr tablet Take 1 tablet (600 mg total) by mouth 2 (two) times daily. 10/03/23   [name], [name], PA  hydrochlorothiazide  (HYDRODIURIL ) 12.5 MG tablet Take 1 tablet (12.5 mg total) by mouth as needed. 10/03/23   [name], [name], PA  HYDROcodone -acetaminophen  (NORCO/VICODIN) 5-325 MG tablet Take 1 tablet by mouth every 4 (four) to 6 (six) hours as needed for pain. Patient taking differently: Take 0.5 tablets by mouth every 4 (four) hours as needed. 04/10/24     levalbuterol  (XOPENEX  HFA) 45 MCG/ACT inhaler Inhale 2 puffs into the lungs every 6 (six) hours as needed for wheezing. 10/01/23 09/30/24  [name], [name], NP  levalbuterol  (XOPENEX ) 0.63 MG/3ML nebulizer solution Take 3 mLs (0.63 mg total) by nebulization every 6 (six) hours as needed for wheezing or shortness of breath. 07/17/23   [name], [name], NP  metFORMIN  (GLUCOPHAGE -XR) 500 MG 24 hr tablet Take 1 tablet (500 mg total) by mouth daily. Patient not taking: Reported on 04/18/2024 02/14/24   [name], [name], PA  metoprolol  succinate (TOPROL -XL) 25 MG 24 hr tablet Take 1 tablet (25 mg total) by mouth 2 (two) times daily. 01/17/24   Debera [name], MD  omeprazole  (PRILOSEC) 20 MG capsule Take  1 capsule (20 mg total) by mouth daily. 09/10/23   [name], [name], PA  ondansetron  (ZOFRAN ) 4 MG tablet Take 1 tablet (4 mg total) by mouth 3 (three) times daily as needed for nausea/vomiting. 12/21/23     triamcinolone  (NASACORT ) 55 MCG/ACT AERO nasal inhaler Place 1 spray into the nose daily. 10/03/23   [name], [name], PA    Allergies: Simvastatin, Cortisone, Albuterol , Augmentin [amoxicillin-pot clavulanate], Ciprofloxacin, Contrast media [iodinated contrast media], Flagyl  [metronidazole ], Latex, and Sulfa  antibiotics    Review of Systems  Neurological:  Positive for weakness.    Updated Vital Signs BP (!)  134/54   Pulse 74   Temp 97.7 F (36.5 C) (Oral)   Resp (!) 21   Ht 5' 11 (1.803 m)   Wt 62.6 kg   SpO2 96%   BMI 19.25 kg/m   Physical Exam Vitals and nursing note reviewed.  Constitutional:      Appearance: He is well-developed.  HENT:     Head: Normocephalic and atraumatic.  Eyes:     Pupils: Pupils are equal, round, and reactive to light.  Neck:     Vascular: No JVD.  Cardiovascular:     Rate and Rhythm: Normal rate and regular rhythm.     Heart sounds: No murmur heard.    No friction rub. No gallop.  Pulmonary:     Effort: No respiratory distress.     Breath sounds: No wheezing.     Comments: Tachypnea, diffuse rhonchi Abdominal:     General: There is no distension.     Tenderness: There is no abdominal tenderness. There is no guarding or rebound.  Musculoskeletal:        General: Normal range of motion.     Cervical back: Normal range of motion and neck supple.  Skin:    Coloration: Skin is not pale.     Findings: No rash.  Neurological:     Mental Status: He is alert and oriented to person, place, and time.  Psychiatric:        Behavior: Behavior normal.     (all labs ordered are listed, but only abnormal results are displayed) Labs Reviewed  COMPREHENSIVE METABOLIC PANEL WITH GFR - Abnormal; Notable for the following components:      Result Value   Potassium 3.2 (*)    Chloride 95 (*)    Glucose, Bld 110 (*)    Total Protein 8.2 (*)    AST 50 (*)    All other components within normal limits  CBC WITH DIFFERENTIAL/PLATELET - Abnormal; Notable for the following components:   WBC 14.5 (*)    Platelets 536 (*)    Neutro Abs 10.7 (*)    Monocytes Absolute 1.7 (*)    Abs Immature Granulocytes 0.16 (*)    All other components within normal limits  RESP PANEL BY RT-PCR (RSV, FLU A&B, COVID)  [id number]  CULTURE, BLOOD (ROUTINE X 2)  CULTURE, BLOOD (ROUTINE X 2)  LACTIC ACID, PLASMA  LACTIC ACID, PLASMA  URINALYSIS, W/ REFLEX TO CULTURE (INFECTION  SUSPECTED)    EKG: EKG Interpretation Date/Time:  Wednesday April 30 2024 21:15:10 EDT Ventricular Rate:  71 PR Interval:  59 QRS Duration:  145 QT Interval:  448 QTC Calculation: 487 R Axis:   247  Text Interpretation: Unknown rhythm, irregular rate Short PR interval Nonspecific IVCD with LAD Anterolateral infarct, old No significant change since last tracing Confirmed by Emil Share [phone]) on 04/30/2024 9:45:11 PM  Radiology: [facility] Chest  Port 1 View Result Date: 04/30/2024 CLINICAL DATA:  Possible sepsis EXAM: PORTABLE CHEST 1 VIEW COMPARISON:  08/12/2023, chest CT 12/17/2023 FINDINGS: Chronic lung disease with bronchiectasis and subtle patchy nodular opacities. Mild cardiomegaly. No acute confluent airspace disease, pleural effusion or pneumothorax. Scarring at the right apex. IMPRESSION: Chronic lung disease.  No acute confluent airspace disease. Electronically Signed   By: Luke Bun M.D.   On: 04/30/2024 20:53     Procedures   Medications Ordered in the ED  sodium chloride  0.9 % bolus 1,000 mL (0 mLs Intravenous Stopped 04/30/24 2237)  sodium chloride  0.9 % bolus 1,000 mL (1,000 mLs Intravenous New Bag/Given 04/30/24 2236)                                    Medical Decision Making Amount and/or Complexity of Data Reviewed Labs: ordered. Radiology: ordered.   78 yo M with a chief complaints of not feeling well.  Patient had 5 teeth extracted about 5 days ago.  Has been struggling to eat and drink well until recently.  Was seen in urgent care and sent here for treatment for dehydration.  Patient is tachypneic on my initial exam.  Diffuse rhonchi.  Will obtain chest x-ray to screen for sepsis.  IV fluid bolus.  Patient is feeling quite a bit better after IV fluids.  Chest x-ray independently interpreted by me with no obvious focal infiltrates or pneumothorax.  COVID flu and RSV are negative.  Patient has a mild leukocytosis with neutrophilic predominance.  Potassium  is mildly low.  Lactate normal.  Still awaiting UA.  Patient tells me he has not had to urinate much today.  Will give him a second liter of IV fluids.  Patient care signed out to Dr. Roselyn, please see their note for further details of care in the ED.  The patients results and plan were reviewed and discussed.   Any x-rays performed were independently reviewed by myself.   Differential diagnosis were considered with the presenting HPI.  Medications  sodium chloride  0.9 % bolus 1,000 mL (0 mLs Intravenous Stopped 04/30/24 2237)  sodium chloride  0.9 % bolus 1,000 mL (1,000 mLs Intravenous New Bag/Given 04/30/24 2236)    Vitals:   04/30/24 1952 04/30/24 1952 04/30/24 2300  BP:  116/82 (!) 134/54  Pulse:  84 74  Resp:  19 (!) 21  Temp:  97.7 F (36.5 C)   TempSrc:  Oral   SpO2:  94% 96%  Weight: 62.6 kg    Height: 5' 11 (1.803 m)      Final diagnoses:  Dehydration    Admission/ observation were discussed with the admitting physician, patient and/or family and they are comfortable with the plan.       Final diagnoses:  Dehydration    ED Discharge Orders     None          Emil Share, DO 04/30/24 2317

## 2024-04-30 NOTE — ED Provider Notes (Signed)
 [facility]    CSN: [id number] Arrival date & time: 04/30/24  1740      History   Chief Complaint Chief Complaint  Patient presents with   Headache   Fatigue    HPI Matthew Hall is a 78 y.o. male with a past medical history listed below presents for fatigue headache.  Patient reports he had 5 teeth pulled 3 weeks ago.  Daughter is on the phone and helps augment history.  States he is on clindamycin which he finished a couple weeks ago.  2 days ago he developed some fatigue with intermittent headaches.  No cough/congestion or URI symptoms or fevers.  No dysuria but daughter is concerned for UTI.  He states he is drinking 3 glasses of fluids a day and eating normally but also reports he had a 10 pound weight loss since his oral surgery.  Daughter states he is already followed up with oral surgery and they do not see any complications.  They live in assisted living facility and the nurse there thought he may have a UTI as well.  He did have some constipation secondary to the narcotics use for his oral surgery but daughter states the nurse gave him some MiraLAX  and he has since been going to the restroom with some diarrhea the past couple days.   Headache Associated symptoms: diarrhea, fatigue and weakness     Past Medical History:  Diagnosis Date   Anxiety    Aortic atherosclerosis    Atrial fibrillation (HCC)    Bladder cancer (HCC)    Chronic systolic heart failure (HCC)    Colon polyps    COPD (chronic obstructive pulmonary disease) (HCC)    Diabetes mellitus with complication (HCC)    Diabetic peripheral neuropathy (HCC) 12/27/2020   Diverticulosis    Essential hypertension    GERD (gastroesophageal reflux disease)    History of CVA with residual deficit    R foot drop   History of pneumonia    Nephrolithiasis    OSA (obstructive sleep apnea)    Osteoarthritis    Pulmonary nodule    surveillance by Dr. Jude   Right foot drop    sequelae of cva    Squamous cell carcinoma of skin 03/03/2024   SCC and BCC face and hand, needs mohs    Patient Active Problem List   Diagnosis Date Noted   Cognitive impairment 01/08/2024   Aphasia 10/28/2023   Lung nodule 10/28/2023   Chronic respiratory failure with hypoxia (HCC) 10/25/2023   Gait abnormality 09/24/2023   DM2 (diabetes mellitus, type 2) (HCC) 07/13/2023   GAD (generalized anxiety disorder) 07/13/2023   Chronic heart failure with mildly reduced ejection fraction (HFmrEF, 41-49%) (HCC) 07/13/2023   Malnutrition of moderate degree 07/13/2023   Paronychia of great toe of left foot 07/02/2023   Weakness of right foot 05/09/2023   History of CVA (cerebrovascular accident) 04/12/2023   Anxiety 04/12/2023   Diabetic peripheral neuropathy (HCC) 12/27/2020   Paresthesia 12/01/2020   Elevated sed rate 05/21/2017   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) 03/01/2017   Vasculitis 02/20/2017   Atrial fibrillation, chronic (HCC) 07/09/2016   BRONCHIECTASIS W/O ACUTE EXACERBATION 03/31/2008   Essential hypertension 09/13/2007   Allergic rhinitis 09/13/2007    Past Surgical History:  Procedure Laterality Date   BRONCHIAL WASHINGS  06/19/2023   Procedure: BRONCHIAL WASHINGS;  Surgeon: Gretta Leita SQUIBB, DO;  Location: [facility];  Service: Cardiopulmonary;;   COLONOSCOPY     Kidney stones removed  1996   SKIN CANCER EXCISION Left    2003   TRANSTHORACIC ECHOCARDIOGRAM     04/2023 EF 40-45 on echo transcription but reviewed by Dr. Vernice who felt like EF was 55 to 60% and no impairment of LV function, nl RV fxn, Mod tricusp regurg.   TRANSURETHRAL RESECTION OF BLADDER TUMOR  1996   VIDEO BRONCHOSCOPY N/A 06/19/2023   Procedure: VIDEO BRONCHOSCOPY WITHOUT FLUORO;  Surgeon: Gretta Leita SQUIBB, DO;  Location: [facility];  Service: Cardiopulmonary;  Laterality: N/A;       Home Medications    Prior to Admission medications   Medication Sig Start Date End Date Taking? Authorizing Provider   Acetaminophen  (TYLENOL ) 325 MG CAPS Take 1-2 capsules by mouth every 6 (six) hours as needed (pain).    [provider]  ALPRAZolam  (XANAX ) 0.25 MG tablet Take 1 tablet (0.25 mg total) by mouth 2 (two) times daily as needed for anxiety or sleep. 02/08/24 06/02/24  McGowenAleene DEL, MD  apixaban  (ELIQUIS ) 5 MG TABS tablet Take 1 tablet (5 mg total) by mouth 2 (two) times daily. 03/27/24   [name], [name], MD  budesonide -glycopyrrolate -formoterol  (BREZTRI  AEROSPHERE) 160-9-4.8 MCG/ACT AERO inhaler Inhale 2 puffs into the lungs 2 (two) times daily. 09/10/23   [name], [name], PA  busPIRone  (BUSPAR ) 10 MG tablet Take 1 tablet (10 mg total) by mouth 2 (two) times daily. 11/19/23   [name], [name], PA  clindamycin (CLEOCIN) 150 MG capsule Take 1 capsule (150 mg total) by mouth 4 (four) times daily until complete Patient not taking: Reported on 04/18/2024 04/10/24     Cyanocobalamin  (VITAMIN B12 PO) Take 1 tablet by mouth daily.    [provider]  feeding supplement (ENSURE ENLIVE / ENSURE PLUS) LIQD Take 237 mLs by mouth 2 (two) times daily between meals. 07/15/23   [name], [name], MD  guaiFENesin  (MUCINEX ) 600 MG 12 hr tablet Take 1 tablet (600 mg total) by mouth 2 (two) times daily. 10/03/23   [name], [name], PA  hydrochlorothiazide  (HYDRODIURIL ) 12.5 MG tablet Take 1 tablet (12.5 mg total) by mouth as needed. 10/03/23   [name], [name], PA  HYDROcodone -acetaminophen  (NORCO/VICODIN) 5-325 MG tablet Take 1 tablet by mouth every 4 (four) to 6 (six) hours as needed for pain. Patient taking differently: Take 0.5 tablets by mouth every 4 (four) hours as needed. 04/10/24     levalbuterol  (XOPENEX  HFA) 45 MCG/ACT inhaler Inhale 2 puffs into the lungs every 6 (six) hours as needed for wheezing. 10/01/23 09/30/24  [name], [name], NP  levalbuterol  (XOPENEX ) 0.63 MG/3ML nebulizer solution Take 3 mLs (0.63 mg total) by nebulization every 6 (six) hours as needed for wheezing or shortness of breath.  07/17/23   [name], [name], NP  metFORMIN  (GLUCOPHAGE -XR) 500 MG 24 hr tablet Take 1 tablet (500 mg total) by mouth daily. Patient not taking: Reported on 04/18/2024 02/14/24   [name], [name], PA  metoprolol  succinate (TOPROL -XL) 25 MG 24 hr tablet Take 1 tablet (25 mg total) by mouth 2 (two) times daily. 01/17/24   Debera [name], MD  omeprazole  (PRILOSEC) 20 MG capsule Take 1 capsule (20 mg total) by mouth daily. 09/10/23   [name], [name], PA  ondansetron  (ZOFRAN ) 4 MG tablet Take 1 tablet (4 mg total) by mouth 3 (three) times daily as needed for nausea/vomiting. 12/21/23     triamcinolone  (NASACORT ) 55 MCG/ACT AERO nasal inhaler Place 1 spray into the nose daily. 10/03/23   [name], [name], PA  Family History Family History  Problem Relation Age of Onset   Diabetes Mother    CAD Mother    Emphysema Father    Diabetes Sister    Stroke Sister    Lung cancer Brother    Liver cancer Brother    Colon cancer Neg Hx     Social History Social History   Tobacco Use   Smoking status: Former    Current packs/day: 0.00    Average packs/day: 1 pack/day for 30.0 years (30.0 ttl pk-yrs)    Types: Cigarettes    Start date: 07/06/1981    Quit date: 07/07/2011    Years since quitting: 12.8   Smokeless tobacco: Never  Vaping Use   Vaping status: Never Used  Substance Use Topics   Alcohol  use: No    Alcohol /week: 0.0 standard drinks of alcohol    Drug use: No     Allergies   Simvastatin, Cortisone, Albuterol , Augmentin [amoxicillin-pot clavulanate], Ciprofloxacin, Contrast media [iodinated contrast media], Flagyl  [metronidazole ], Latex, and Sulfa  antibiotics   Review of Systems Review of Systems  Constitutional:  Positive for fatigue.  Gastrointestinal:  Positive for diarrhea.  Neurological:  Positive for weakness and headaches.     Physical Exam Triage Vital Signs ED Triage Vitals  Encounter Vitals Group     BP 04/30/24 1752 (!) 90/56     Girls Systolic BP Percentile --       Girls Diastolic BP Percentile --      Boys Systolic BP Percentile --      Boys Diastolic BP Percentile --      Pulse Rate 04/30/24 1752 76     Resp 04/30/24 1752 (!) 22     Temp 04/30/24 1752 (!) 97.5 F (36.4 C)     Temp Source 04/30/24 1752 Oral     SpO2 04/30/24 1752 93 %     Weight --      Height --      Head Circumference --      Peak Flow --      Pain Score 04/30/24 1749 5     Pain Loc --      Pain Education --      Exclude from Growth Chart --    No data found.  Updated Vital Signs BP (!) 90/56   Pulse 76   Temp (!) 97.5 F (36.4 C) (Oral)   Resp (!) 22   SpO2 93%   Visual Acuity Right Eye Distance:   Left Eye Distance:   Bilateral Distance:    Right Eye Near:   Left Eye Near:    Bilateral Near:     Physical Exam Vitals and nursing note reviewed.  Constitutional:      General: He is not in acute distress.    Appearance: He is ill-appearing. He is not toxic-appearing or diaphoretic.     Comments: Appears very fatigued  HENT:     Head: Normocephalic and atraumatic.  Eyes:     Pupils: Pupils are equal, round, and reactive to light.  Cardiovascular:     Rate and Rhythm: Normal rate and regular rhythm.     Heart sounds: Normal heart sounds.  Pulmonary:     Effort: Pulmonary effort is normal.  Skin:    General: Skin is warm.     Comments: There is tenting of skin on bilateral hands  Neurological:     General: No focal deficit present.     Mental Status: He is alert and oriented to person, place,  and time.  Psychiatric:        Mood and Affect: Mood normal.      UC Treatments / Results  Labs (all labs ordered are listed, but only abnormal results are displayed) Labs Reviewed  POCT URINE DIPSTICK - Abnormal; Notable for the following components:      Result Value   Color, UA orange (*)    Bilirubin, UA small (*)    Blood, UA trace-intact (*)    POC PROTEIN,UA =100 (*)    All other components within normal limits  POC COVID19/FLU A&B COMBO     EKG   Radiology No results found.  Procedures Procedures (including critical care time)  Medications Ordered in UC Medications - No data to display  Initial Impression / Assessment and Plan / UC Course  I have reviewed the triage vital signs and the nursing notes.  Pertinent labs & imaging results that were available during my care of the patient were reviewed by me and considered in my medical decision making (see chart for details).     I reviewed exam and symptoms with patient and his daughter via phone.  Negative COVID flu and urine negative for UTI.  Patient presenting with malaise, fatigue presenting with hypotension.  Given his symptoms and negative workup.  Advised that they go to the emergency room for further evaluation and workup.  Patient and daughter in agreement with plan will go POV with the caregiver taking him.  He prefers to go to [facility].  I did advise that if there is anything more significant they may need to transfer him to [facility] and he verbalized understanding. Final Clinical Impressions(s) / UC Diagnoses   Final diagnoses:  Other fatigue  Dehydration   Discharge Instructions   None    ED Prescriptions   None    PDMP not reviewed this encounter.   Loreda Myla SAUNDERS, NP 04/30/24 [phone]

## 2024-05-01 NOTE — ED Notes (Signed)
 Contacted pt's daughter via telephone. She is en route to pick pt up

## 2024-05-01 NOTE — ED Notes (Signed)
 Pt given ice water.

## 2024-05-02 ENCOUNTER — Ambulatory Visit: Admitting: Family Medicine

## 2024-05-02 ENCOUNTER — Encounter: Payer: Self-pay | Admitting: Family Medicine

## 2024-05-02 ENCOUNTER — Other Ambulatory Visit (HOSPITAL_BASED_OUTPATIENT_CLINIC_OR_DEPARTMENT_OTHER): Payer: Self-pay

## 2024-05-02 ENCOUNTER — Inpatient Hospital Stay: Admitting: Family Medicine

## 2024-05-02 VITALS — BP 130/70 | HR 73 | Temp 97.7°F | Ht 71.0 in | Wt 142.6 lb

## 2024-05-02 DIAGNOSIS — R5383 Other fatigue: Secondary | ICD-10-CM

## 2024-05-02 DIAGNOSIS — D72829 Elevated white blood cell count, unspecified: Secondary | ICD-10-CM

## 2024-05-02 DIAGNOSIS — E860 Dehydration: Secondary | ICD-10-CM

## 2024-05-02 DIAGNOSIS — J189 Pneumonia, unspecified organism: Secondary | ICD-10-CM | POA: Diagnosis not present

## 2024-05-02 LAB — CBC WITH DIFFERENTIAL/PLATELET
Basophils Absolute: 0.1 K/uL (ref 0.0–0.1)
Basophils Relative: 0.6 % (ref 0.0–3.0)
Eosinophils Absolute: 0.2 K/uL (ref 0.0–0.7)
Eosinophils Relative: 1.9 % (ref 0.0–5.0)
HCT: 34 % — ABNORMAL LOW (ref 39.0–52.0)
Hemoglobin: 10.9 g/dL — ABNORMAL LOW (ref 13.0–17.0)
Lymphocytes Relative: 8.7 % — ABNORMAL LOW (ref 12.0–46.0)
Lymphs Abs: 0.9 K/uL (ref 0.7–4.0)
MCHC: 32.2 g/dL (ref 30.0–36.0)
MCV: 88.2 fl (ref 78.0–100.0)
Monocytes Absolute: 1.4 K/uL — ABNORMAL HIGH (ref 0.1–1.0)
Monocytes Relative: 12.9 % — ABNORMAL HIGH (ref 3.0–12.0)
Neutro Abs: 8.2 K/uL — ABNORMAL HIGH (ref 1.4–7.7)
Neutrophils Relative %: 75.9 % (ref 43.0–77.0)
Platelets: 513 K/uL — ABNORMAL HIGH (ref 150.0–400.0)
RBC: 3.85 Mil/uL — ABNORMAL LOW (ref 4.22–5.81)
RDW: 15.3 % (ref 11.5–15.5)
WBC: 10.8 K/uL — ABNORMAL HIGH (ref 4.0–10.5)

## 2024-05-02 LAB — BASIC METABOLIC PANEL WITH GFR
BUN: 14 mg/dL (ref 6–23)
CO2: 28 meq/L (ref 19–32)
Calcium: 8.4 mg/dL (ref 8.4–10.5)
Chloride: 99 meq/L (ref 96–112)
Creatinine, Ser: 0.63 mg/dL (ref 0.40–1.50)
GFR: 90.14 mL/min (ref >60.00)
Glucose, Bld: 120 mg/dL — ABNORMAL HIGH (ref 70–99)
Potassium: 3.6 meq/L (ref 3.5–5.1)
Sodium: 137 meq/L (ref 135–145)

## 2024-05-02 MED ORDER — AZITHROMYCIN 250 MG PO TABS
ORAL_TABLET | ORAL | 0 refills | Status: DC
  Filled 2024-05-02: qty 6, 5d supply, fill #0

## 2024-05-02 NOTE — Progress Notes (Signed)
 OFFICE VISIT  05/02/2024  CC:  Chief Complaint  Patient presents with   Follow-up    ED    Patient is a 78 y.o. male who presents for fatigue, refusing to eat, and having some increased irritability and confusion.  HPI: He got 5 teeth extracted about 3 weeks ago.  Lots of pain afterward, could not even chew to eat.  He was put on clindamycin by the dentist.  He got significant GI upset and this further precipitated poor p.o. intake. He was having increasing fatigue and was brought to an urgent care 2 days ago, then was sent to the emergency department.  Urine, blood testing, and chest x-ray all unremarkable except mild leukocytosis.  He was given IV fluids. Blood cultures negative x 2 days.  He is beginning to have less mouth pain and is able to chew better.  He says he is drinking water, tea, and coffee. He has not had any diarrhea or abdominal pain.  He has chronic rhinitis and has not been taking his antihistamine lately but he denies any sore throat or cough or shortness of breath or wheezing. 3 days ago he felt subjective fever/chills and again the following day.  None today. He denies blood in stool or blood in stool.  He had some constipation on the hydrocodone  but stopped taking it and this has resolved. No dysuria, urinary urgency, or frequency. No chest pain, palpitations, dizziness, or headaches.  Past Medical History:  Diagnosis Date   Anxiety    Aortic atherosclerosis    Atrial fibrillation (HCC)    Bladder cancer (HCC)    Chronic systolic heart failure (HCC)    Colon polyps    COPD (chronic obstructive pulmonary disease) (HCC)    Diabetes mellitus with complication (HCC)    Diabetic peripheral neuropathy (HCC) 12/27/2020   Diverticulosis    Essential hypertension    GERD (gastroesophageal reflux disease)    History of CVA with residual deficit    R foot drop   History of pneumonia    Nephrolithiasis    OSA (obstructive sleep apnea)    Osteoarthritis     Pulmonary nodule    surveillance by Dr. 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 Vernice who felt like EF was 55 to 60% and no impairment of LV function, nl RV fxn, Mod tricusp regurg.   TRANSURETHRAL RESECTION OF BLADDER TUMOR  1996   VIDEO BRONCHOSCOPY N/A 06/19/2023   Procedure: VIDEO BRONCHOSCOPY WITHOUT FLUORO;  Surgeon: Gretta Leita SQUIBB, DO;  Location: [facility];  Service: Cardiopulmonary;  Laterality: N/A;    Outpatient Medications Prior to Visit  Medication Sig Dispense Refill   Acetaminophen  (TYLENOL ) 325 MG CAPS Take 1-2 capsules by mouth every 6 (six) hours as needed (pain).     ALPRAZolam  (XANAX ) 0.25 MG tablet Take 1 tablet (0.25 mg total) by mouth 2 (two)  times daily as needed for anxiety or sleep. 90 tablet 0   apixaban  (ELIQUIS ) 5 MG TABS tablet Take 1 tablet (5 mg total) by mouth 2 (two) times daily. 180 tablet 1   budesonide -glycopyrrolate -formoterol  (BREZTRI  AEROSPHERE) 160-9-4.8 MCG/ACT AERO inhaler Inhale 2 puffs into the lungs 2 (two) times daily. 10.7 g 11   busPIRone  (BUSPAR ) 10 MG tablet Take 1 tablet (10 mg total) by mouth 2 (two) times daily. 180 tablet 1   Cyanocobalamin  (VITAMIN B12 PO) Take 1 tablet by mouth daily.     feeding supplement (ENSURE ENLIVE / ENSURE PLUS) LIQD Take 237 mLs by mouth 2 (two) times daily between meals.     guaiFENesin  (MUCINEX ) 600 MG 12 hr tablet Take 1 tablet (600 mg total) by mouth 2 (two) times daily. 60 tablet 5    hydrochlorothiazide  (HYDRODIURIL ) 12.5 MG tablet Take 1 tablet (12.5 mg total) by mouth as needed. 30 tablet 3   HYDROcodone -acetaminophen  (NORCO/VICODIN) 5-325 MG tablet Take 1 tablet by mouth every 4 (four) to 6 (six) hours as needed for pain. (Patient taking differently: Take 0.5 tablets by mouth every 4 (four) hours as needed.) 14 tablet 0   levalbuterol  (XOPENEX  HFA) 45 MCG/ACT inhaler Inhale 2 puffs into the lungs every 6 (six) hours as needed for wheezing. 15 g 3   levalbuterol  (XOPENEX ) 0.63 MG/3ML nebulizer solution Take 3 mLs (0.63 mg total) by nebulization every 6 (six) hours as needed for wheezing or shortness of breath. 270 mL 5   metFORMIN  (GLUCOPHAGE -XR) 500 MG 24 hr tablet Take 1 tablet (500 mg total) by mouth daily. 90 tablet 3   metoprolol  succinate (TOPROL -XL) 25 MG 24 hr tablet Take 1 tablet (25 mg total) by mouth 2 (two) times daily. 180 tablet 2   omeprazole  (PRILOSEC) 20 MG capsule Take 1 capsule (20 mg total) by mouth daily. 90 capsule 2   ondansetron  (ZOFRAN ) 4 MG tablet Take 1 tablet (4 mg total) by mouth 3 (three) times daily as needed for nausea/vomiting. 30 tablet 1   triamcinolone  (NASACORT ) 55 MCG/ACT AERO nasal inhaler Place 1 spray into the nose daily. 16.9 mL 5   clindamycin (CLEOCIN) 150 MG capsule Take 1 capsule (150 mg total) by mouth 4 (four) times daily until complete (Patient not taking: Reported on 05/02/2024) 28 capsule 0   No facility-administered medications prior to visit.    Allergies  Allergen Reactions   Simvastatin Rash   Cortisone Nausea And Vomiting   Albuterol  Palpitations and Other (See Comments)    Pt is okay to take xopenex , albuterol  raises his heart rate   Augmentin [Amoxicillin-Pot Clavulanate] Rash   Ciprofloxacin Rash   Contrast Media [Iodinated Contrast Media] Rash   Flagyl  [Metronidazole ] Rash   Latex Hives and Rash   Sulfa  Antibiotics Rash    Review of Systems As per HPI  PE:    05/02/2024   11:45 AM 05/02/2024    11:42 AM 04/30/2024   11:00 PM  Vitals with BMI  Height  5' 11   Weight  142 lbs 10 oz   BMI  19.9   Systolic 130 149 865  Diastolic 70 78 54  Pulse  73 74     Physical Exam  General: Alert and tired appearing but does not appear acutely ill. Affect is pleasant, lucid thought and speech. Oropharynx shows pink and moist mucosa without any areas of dental infection noted. Neck is soft and without any lymphadenopathy or thyromegaly. Cardiovascular: Irregularly irregular, soft systolic murmur.  No  diastolic murmur. Lungs: He has bibasilar inspiratory crackles, farther up on the right than on the left.  No wheezing.  Expiratory phase is not prolonged.  Breathing is nonlabored. Extremities: No edema  LABS:  Last CBC Lab Results  Component Value Date   WBC 14.5 (H) 04/30/2024   HGB 13.2 04/30/2024   HCT 41.2 04/30/2024   MCV 89.6 04/30/2024   MCH 28.7 04/30/2024   RDW 14.3 04/30/2024   PLT 536 (H) 04/30/2024   Last metabolic panel Lab Results  Component Value Date   GLUCOSE 110 (H) 04/30/2024   NA 136 04/30/2024   K 3.2 (L) 04/30/2024   CL 95 (L) 04/30/2024   CO2 30 04/30/2024   BUN 15 04/30/2024   CREATININE 0.89 04/30/2024   GFRNONAA >60 04/30/2024   CALCIUM  9.9 04/30/2024   PHOS 2.9 08/14/2023   PROT 8.2 (H) 04/30/2024   ALBUMIN 3.8 04/30/2024   LABGLOB 3.3 05/09/2023   LABGLOB 2.8 05/09/2023   BILITOT 0.7 04/30/2024   ALKPHOS 88 04/30/2024   AST 50 (H) 04/30/2024   ALT 39 04/30/2024   ANIONGAP 11 04/30/2024   Last hemoglobin A1c Lab Results  Component Value Date   HGBA1C 5.9 (A) 03/19/2024   HGBA1C 5.9 03/19/2024   HGBA1C 5.9 03/19/2024   HGBA1C 5.9 03/19/2024   Last thyroid  functions Lab Results  Component Value Date   TSH 1.36 09/10/2023   Lab Results  Component Value Date   VITAMINB12 >1537 (H) 09/10/2023   IMPRESSION AND PLAN:  Fatigue. This whole scenario started with the dental extraction and associated pain with this as well as likely  some side effect from clindamycin causing GI upset.  He is medically frail and is taking a while rebound.  Today his lung exam is a bit worrisome for pneumonia, although no focal symptoms, plus these lung sounds could be part of his chronic lung disease. At any rate, having had significant subjective fever over the last few days I do want to treat for the possibility of pneumonia--> he has multiple antibiotic intolerances/allergies--> Z-Pak prescribed today. Repeat CBC and BMP today.  An After Visit Summary was printed and given to the patient.  FOLLOW UP: Return for 7-10 day f/u fatigue and pneumonia.  Signed:  Gerlene Hockey, MD           05/02/2024

## 2024-05-02 NOTE — Telephone Encounter (Signed)
 FYI has been reviewed.

## 2024-05-05 ENCOUNTER — Ambulatory Visit: Payer: Self-pay | Admitting: Family Medicine

## 2024-05-05 LAB — CULTURE, BLOOD (ROUTINE X 2)
Culture: NO GROWTH
Culture: NO GROWTH
Special Requests: ADEQUATE
Special Requests: ADEQUATE

## 2024-05-06 ENCOUNTER — Encounter: Payer: Self-pay | Admitting: Cardiology

## 2024-05-06 ENCOUNTER — Emergency Department (HOSPITAL_BASED_OUTPATIENT_CLINIC_OR_DEPARTMENT_OTHER)

## 2024-05-06 ENCOUNTER — Other Ambulatory Visit: Payer: Self-pay

## 2024-05-06 ENCOUNTER — Encounter: Payer: Self-pay | Admitting: Family Medicine

## 2024-05-06 ENCOUNTER — Inpatient Hospital Stay (HOSPITAL_BASED_OUTPATIENT_CLINIC_OR_DEPARTMENT_OTHER)
Admission: EM | Admit: 2024-05-06 | Discharge: 2024-05-17 | DRG: 291 | Disposition: A | Discharge status: Home Health | Attending: Internal Medicine | Admitting: Internal Medicine

## 2024-05-06 DIAGNOSIS — Z880 Allergy status to penicillin: Secondary | ICD-10-CM

## 2024-05-06 DIAGNOSIS — Z681 Body mass index (BMI) 19 or less, adult: Secondary | ICD-10-CM

## 2024-05-06 DIAGNOSIS — I5023 Acute on chronic systolic (congestive) heart failure: Secondary | ICD-10-CM | POA: Diagnosis present

## 2024-05-06 DIAGNOSIS — K219 Gastro-esophageal reflux disease without esophagitis: Secondary | ICD-10-CM | POA: Diagnosis present

## 2024-05-06 DIAGNOSIS — Z800 Family history of malignant neoplasm of digestive organs: Secondary | ICD-10-CM

## 2024-05-06 DIAGNOSIS — D72829 Elevated white blood cell count, unspecified: Secondary | ICD-10-CM | POA: Diagnosis not present

## 2024-05-06 DIAGNOSIS — Z9981 Dependence on supplemental oxygen: Secondary | ICD-10-CM | POA: Diagnosis not present

## 2024-05-06 DIAGNOSIS — J9621 Acute and chronic respiratory failure with hypoxia: Secondary | ICD-10-CM | POA: Diagnosis present

## 2024-05-06 DIAGNOSIS — R911 Solitary pulmonary nodule: Secondary | ICD-10-CM | POA: Diagnosis present

## 2024-05-06 DIAGNOSIS — F419 Anxiety disorder, unspecified: Secondary | ICD-10-CM | POA: Diagnosis present

## 2024-05-06 DIAGNOSIS — D75839 Thrombocytosis, unspecified: Secondary | ICD-10-CM | POA: Diagnosis not present

## 2024-05-06 DIAGNOSIS — E1142 Type 2 diabetes mellitus with diabetic polyneuropathy: Secondary | ICD-10-CM | POA: Diagnosis present

## 2024-05-06 DIAGNOSIS — J449 Chronic obstructive pulmonary disease, unspecified: Secondary | ICD-10-CM | POA: Diagnosis present

## 2024-05-06 DIAGNOSIS — E860 Dehydration: Secondary | ICD-10-CM | POA: Diagnosis present

## 2024-05-06 DIAGNOSIS — Z85828 Personal history of other malignant neoplasm of skin: Secondary | ICD-10-CM

## 2024-05-06 DIAGNOSIS — G4733 Obstructive sleep apnea (adult) (pediatric): Secondary | ICD-10-CM | POA: Diagnosis present

## 2024-05-06 DIAGNOSIS — Z1152 Encounter for screening for COVID-19: Secondary | ICD-10-CM | POA: Diagnosis not present

## 2024-05-06 DIAGNOSIS — R269 Unspecified abnormalities of gait and mobility: Secondary | ICD-10-CM

## 2024-05-06 DIAGNOSIS — Z888 Allergy status to other drugs, medicaments and biological substances status: Secondary | ICD-10-CM

## 2024-05-06 DIAGNOSIS — R509 Fever, unspecified: Secondary | ICD-10-CM | POA: Diagnosis not present

## 2024-05-06 DIAGNOSIS — E876 Hypokalemia: Secondary | ICD-10-CM | POA: Diagnosis present

## 2024-05-06 DIAGNOSIS — I110 Hypertensive heart disease with heart failure: Secondary | ICD-10-CM | POA: Diagnosis present

## 2024-05-06 DIAGNOSIS — J690 Pneumonitis due to inhalation of food and vomit: Secondary | ICD-10-CM | POA: Diagnosis not present

## 2024-05-06 DIAGNOSIS — Z87891 Personal history of nicotine dependence: Secondary | ICD-10-CM

## 2024-05-06 DIAGNOSIS — R000 Tachycardia, unspecified: Secondary | ICD-10-CM | POA: Diagnosis not present

## 2024-05-06 DIAGNOSIS — Z881 Allergy status to other antibiotic agents status: Secondary | ICD-10-CM

## 2024-05-06 DIAGNOSIS — R058 Other specified cough: Secondary | ICD-10-CM

## 2024-05-06 DIAGNOSIS — M21371 Foot drop, right foot: Secondary | ICD-10-CM | POA: Diagnosis present

## 2024-05-06 DIAGNOSIS — D509 Iron deficiency anemia, unspecified: Secondary | ICD-10-CM | POA: Diagnosis present

## 2024-05-06 DIAGNOSIS — E43 Unspecified severe protein-calorie malnutrition: Secondary | ICD-10-CM | POA: Diagnosis present

## 2024-05-06 DIAGNOSIS — Z7984 Long term (current) use of oral hypoglycemic drugs: Secondary | ICD-10-CM | POA: Diagnosis not present

## 2024-05-06 DIAGNOSIS — Z833 Family history of diabetes mellitus: Secondary | ICD-10-CM

## 2024-05-06 DIAGNOSIS — Z801 Family history of malignant neoplasm of trachea, bronchus and lung: Secondary | ICD-10-CM

## 2024-05-06 DIAGNOSIS — B370 Candidal stomatitis: Secondary | ICD-10-CM | POA: Diagnosis not present

## 2024-05-06 DIAGNOSIS — Z823 Family history of stroke: Secondary | ICD-10-CM

## 2024-05-06 DIAGNOSIS — I5A Non-ischemic myocardial injury (non-traumatic): Secondary | ICD-10-CM | POA: Diagnosis present

## 2024-05-06 DIAGNOSIS — Z8249 Family history of ischemic heart disease and other diseases of the circulatory system: Secondary | ICD-10-CM

## 2024-05-06 DIAGNOSIS — I509 Heart failure, unspecified: Secondary | ICD-10-CM | POA: Diagnosis not present

## 2024-05-06 DIAGNOSIS — Z79899 Other long term (current) drug therapy: Secondary | ICD-10-CM

## 2024-05-06 DIAGNOSIS — R4189 Other symptoms and signs involving cognitive functions and awareness: Secondary | ICD-10-CM

## 2024-05-06 DIAGNOSIS — G587 Mononeuritis multiplex: Secondary | ICD-10-CM | POA: Diagnosis present

## 2024-05-06 DIAGNOSIS — Z860100 Personal history of colon polyps, unspecified: Secondary | ICD-10-CM

## 2024-05-06 DIAGNOSIS — Z91041 Radiographic dye allergy status: Secondary | ICD-10-CM

## 2024-05-06 DIAGNOSIS — Z91040 Latex allergy status: Secondary | ICD-10-CM

## 2024-05-06 DIAGNOSIS — I480 Paroxysmal atrial fibrillation: Secondary | ICD-10-CM | POA: Diagnosis present

## 2024-05-06 DIAGNOSIS — I69398 Other sequelae of cerebral infarction: Secondary | ICD-10-CM

## 2024-05-06 DIAGNOSIS — Z883 Allergy status to other anti-infective agents status: Secondary | ICD-10-CM

## 2024-05-06 DIAGNOSIS — Z825 Family history of asthma and other chronic lower respiratory diseases: Secondary | ICD-10-CM

## 2024-05-06 DIAGNOSIS — Z87442 Personal history of urinary calculi: Secondary | ICD-10-CM

## 2024-05-06 DIAGNOSIS — J479 Bronchiectasis, uncomplicated: Secondary | ICD-10-CM | POA: Diagnosis not present

## 2024-05-06 DIAGNOSIS — Z882 Allergy status to sulfonamides status: Secondary | ICD-10-CM

## 2024-05-06 DIAGNOSIS — Z7901 Long term (current) use of anticoagulants: Secondary | ICD-10-CM | POA: Diagnosis not present

## 2024-05-06 DIAGNOSIS — R634 Abnormal weight loss: Secondary | ICD-10-CM | POA: Diagnosis not present

## 2024-05-06 DIAGNOSIS — Z8551 Personal history of malignant neoplasm of bladder: Secondary | ICD-10-CM

## 2024-05-06 DIAGNOSIS — J9610 Chronic respiratory failure, unspecified whether with hypoxia or hypercapnia: Secondary | ICD-10-CM | POA: Diagnosis not present

## 2024-05-06 DIAGNOSIS — J698 Pneumonitis due to inhalation of other solids and liquids: Secondary | ICD-10-CM | POA: Diagnosis not present

## 2024-05-06 DIAGNOSIS — I5031 Acute diastolic (congestive) heart failure: Secondary | ICD-10-CM | POA: Diagnosis not present

## 2024-05-06 LAB — COMPREHENSIVE METABOLIC PANEL WITH GFR
ALT: 23 U/L (ref 0–44)
AST: 27 U/L (ref 15–41)
Albumin: 3.2 g/dL — ABNORMAL LOW (ref 3.5–5.0)
Alkaline Phosphatase: 140 U/L — ABNORMAL HIGH (ref 38–126)
Anion gap: 11 (ref 5–15)
BUN: 14 mg/dL (ref 8–23)
CO2: 28 mmol/L (ref 22–32)
Calcium: 9.2 mg/dL (ref 8.9–10.3)
Chloride: 96 mmol/L — ABNORMAL LOW (ref 98–111)
Creatinine, Ser: 0.82 mg/dL (ref 0.61–1.24)
GFR, Estimated: >60 mL/min (ref >60)
Glucose, Bld: 100 mg/dL — ABNORMAL HIGH (ref 70–99)
Potassium: 4.3 mmol/L (ref 3.5–5.1)
Sodium: 134 mmol/L — ABNORMAL LOW (ref 135–145)
Total Bilirubin: 0.6 mg/dL (ref 0.0–1.2)
Total Protein: 7.2 g/dL (ref 6.5–8.1)

## 2024-05-06 LAB — CBC
HCT: 32.4 % — ABNORMAL LOW (ref 39.0–52.0)
Hemoglobin: 10.4 g/dL — ABNORMAL LOW (ref 13.0–17.0)
MCH: 28.5 pg (ref 26.0–34.0)
MCHC: 32.1 g/dL (ref 30.0–36.0)
MCV: 88.8 fL (ref 80.0–100.0)
Platelets: 510 K/uL — ABNORMAL HIGH (ref 150–400)
RBC: 3.65 MIL/uL — ABNORMAL LOW (ref 4.22–5.81)
RDW: 14.3 % (ref 11.5–15.5)
WBC: 13.5 K/uL — ABNORMAL HIGH (ref 4.0–10.5)
nRBC: 0 % (ref 0.0–0.2)

## 2024-05-06 LAB — URINALYSIS, W/ REFLEX TO CULTURE (INFECTION SUSPECTED)
Bacteria, UA: NONE SEEN
Bilirubin Urine: NEGATIVE
Glucose, UA: NEGATIVE mg/dL
Hgb urine dipstick: NEGATIVE
Ketones, ur: NEGATIVE mg/dL
Leukocytes,Ua: NEGATIVE
Nitrite: NEGATIVE
Specific Gravity, Urine: 1.022 (ref 1.005–1.030)
pH: 7 (ref 5.0–8.0)

## 2024-05-06 LAB — TROPONIN T, HIGH SENSITIVITY
Troponin T High Sensitivity: <15 ng/L (ref 0–19)
Troponin T High Sensitivity: 21 ng/L — ABNORMAL HIGH (ref 0–19)

## 2024-05-06 LAB — RESP PANEL BY RT-PCR (RSV, FLU A&B, COVID)  RVPGX2
Influenza A by PCR: NEGATIVE
Influenza B by PCR: NEGATIVE
Resp Syncytial Virus by PCR: NEGATIVE
SARS Coronavirus 2 by RT PCR: NEGATIVE

## 2024-05-06 LAB — CBG MONITORING, ED: Glucose-Capillary: 95 mg/dL (ref 70–99)

## 2024-05-06 LAB — PROTIME-INR
INR: 1.6 — ABNORMAL HIGH (ref 0.8–1.2)
Prothrombin Time: 19.6 s — ABNORMAL HIGH (ref 11.4–15.2)

## 2024-05-06 LAB — LACTIC ACID, PLASMA: Lactic Acid, Venous: 1.2 mmol/L (ref 0.5–1.9)

## 2024-05-06 LAB — GLUCOSE, CAPILLARY: Glucose-Capillary: 110 mg/dL — ABNORMAL HIGH (ref 70–99)

## 2024-05-06 LAB — PRO BRAIN NATRIURETIC PEPTIDE: Pro Brain Natriuretic Peptide: 5188 pg/mL — ABNORMAL HIGH (ref <300.0)

## 2024-05-06 MED ORDER — LEVALBUTEROL HCL 0.63 MG/3ML IN NEBU
INHALATION_SOLUTION | RESPIRATORY_TRACT | Status: AC
  Filled 2024-05-06: qty 3

## 2024-05-06 MED ORDER — GUAIFENESIN ER 600 MG PO TB12
600.0000 mg | ORAL_TABLET | Freq: Two times a day (BID) | ORAL | Status: DC
  Administered 2024-05-07 – 2024-05-17 (×22): 600 mg via ORAL
  Filled 2024-05-06 (×22): qty 1

## 2024-05-06 MED ORDER — SODIUM CHLORIDE 0.9 % IV SOLN
2.0000 g | Freq: Three times a day (TID) | INTRAVENOUS | Status: DC
  Administered 2024-05-07 – 2024-05-08 (×5): 2 g via INTRAVENOUS
  Filled 2024-05-06 (×5): qty 12.5

## 2024-05-06 MED ORDER — MELATONIN 3 MG PO TABS
6.0000 mg | ORAL_TABLET | Freq: Every evening | ORAL | Status: DC | PRN
  Administered 2024-05-15: 6 mg via ORAL
  Filled 2024-05-06: qty 2

## 2024-05-06 MED ORDER — LEVALBUTEROL HCL 0.63 MG/3ML IN NEBU
0.6300 mg | INHALATION_SOLUTION | Freq: Once | RESPIRATORY_TRACT | Status: AC
  Administered 2024-05-06: 0.63 mg via RESPIRATORY_TRACT

## 2024-05-06 MED ORDER — BUSPIRONE HCL 10 MG PO TABS
10.0000 mg | ORAL_TABLET | Freq: Two times a day (BID) | ORAL | Status: DC
  Administered 2024-05-07 – 2024-05-17 (×22): 10 mg via ORAL
  Filled 2024-05-06 (×22): qty 1

## 2024-05-06 MED ORDER — SODIUM CHLORIDE 0.9 % IV SOLN
1.0000 g | Freq: Once | INTRAVENOUS | Status: AC
  Administered 2024-05-06: 1 g via INTRAVENOUS
  Filled 2024-05-06: qty 10

## 2024-05-06 MED ORDER — PANTOPRAZOLE SODIUM 40 MG PO TBEC
40.0000 mg | DELAYED_RELEASE_TABLET | Freq: Every day | ORAL | Status: DC
  Administered 2024-05-07 – 2024-05-17 (×11): 40 mg via ORAL
  Filled 2024-05-06 (×11): qty 1

## 2024-05-06 MED ORDER — SODIUM CHLORIDE 0.9 % IN NEBU
3.0000 mL | INHALATION_SOLUTION | Freq: Two times a day (BID) | RESPIRATORY_TRACT | Status: DC
  Administered 2024-05-07 (×2): 3 mL via RESPIRATORY_TRACT
  Filled 2024-05-06 (×3): qty 3

## 2024-05-06 MED ORDER — SODIUM CHLORIDE 0.9 % IV SOLN
500.0000 mg | Freq: Once | INTRAVENOUS | Status: DC
  Filled 2024-05-06: qty 5

## 2024-05-06 MED ORDER — BUDESON-GLYCOPYRROL-FORMOTEROL 160-9-4.8 MCG/ACT IN AERO
2.0000 | INHALATION_SPRAY | Freq: Two times a day (BID) | RESPIRATORY_TRACT | Status: DC
  Administered 2024-05-07 – 2024-05-17 (×19): 2 via RESPIRATORY_TRACT
  Filled 2024-05-06 (×2): qty 5.9

## 2024-05-06 MED ORDER — FUROSEMIDE 10 MG/ML IJ SOLN
40.0000 mg | Freq: Once | INTRAMUSCULAR | Status: AC
  Administered 2024-05-06: 40 mg via INTRAVENOUS
  Filled 2024-05-06: qty 4

## 2024-05-06 MED ORDER — SODIUM CHLORIDE 0.9% FLUSH
3.0000 mL | Freq: Two times a day (BID) | INTRAVENOUS | Status: DC
  Administered 2024-05-07 – 2024-05-17 (×21): 3 mL via INTRAVENOUS

## 2024-05-06 MED ORDER — ALPRAZOLAM 0.25 MG PO TABS
0.2500 mg | ORAL_TABLET | Freq: Two times a day (BID) | ORAL | Status: DC | PRN
Start: 2024-05-06 — End: 2024-05-17
  Administered 2024-05-08 – 2024-05-16 (×11): 0.25 mg via ORAL
  Filled 2024-05-06 (×11): qty 1

## 2024-05-06 MED ORDER — POLYETHYLENE GLYCOL 3350 17 G PO PACK: 17.0000 g | PACK | Freq: Every day | ORAL | Status: DC | PRN

## 2024-05-06 MED ORDER — IPRATROPIUM-ALBUTEROL 0.5-2.5 (3) MG/3ML IN SOLN: 3.0000 mL | Freq: Four times a day (QID) | RESPIRATORY_TRACT | Status: DC

## 2024-05-06 MED ORDER — ONDANSETRON HCL 4 MG/2ML IJ SOLN
4.0000 mg | Freq: Four times a day (QID) | INTRAMUSCULAR | Status: DC | PRN
  Filled 2024-05-06: qty 2

## 2024-05-06 MED ORDER — DOXYCYCLINE HYCLATE 100 MG PO TABS
100.0000 mg | ORAL_TABLET | Freq: Once | ORAL | Status: AC
  Administered 2024-05-06: 100 mg via ORAL
  Filled 2024-05-06: qty 1

## 2024-05-06 MED ORDER — ENSURE ENLIVE PO LIQD
237.0000 mL | Freq: Two times a day (BID) | ORAL | Status: DC
  Administered 2024-05-07 (×2): 237 mL via ORAL
  Filled 2024-05-06 (×4): qty 237

## 2024-05-06 MED ORDER — METOPROLOL SUCCINATE ER 25 MG PO TB24
25.0000 mg | ORAL_TABLET | Freq: Two times a day (BID) | ORAL | Status: DC
  Administered 2024-05-07 – 2024-05-17 (×22): 25 mg via ORAL
  Filled 2024-05-06 (×23): qty 1

## 2024-05-06 MED ORDER — APIXABAN 5 MG PO TABS
5.0000 mg | ORAL_TABLET | Freq: Two times a day (BID) | ORAL | Status: DC
  Administered 2024-05-07 – 2024-05-17 (×22): 5 mg via ORAL
  Filled 2024-05-06 (×22): qty 1

## 2024-05-06 MED ORDER — LEVALBUTEROL HCL 0.63 MG/3ML IN NEBU
0.6300 mg | INHALATION_SOLUTION | Freq: Four times a day (QID) | RESPIRATORY_TRACT | Status: DC
  Administered 2024-05-07 – 2024-05-08 (×4): 0.63 mg via RESPIRATORY_TRACT
  Filled 2024-05-06 (×7): qty 3

## 2024-05-06 MED ORDER — ACETAMINOPHEN 500 MG PO TABS
1000.0000 mg | ORAL_TABLET | Freq: Four times a day (QID) | ORAL | Status: DC | PRN
  Administered 2024-05-08: 1000 mg via ORAL
  Filled 2024-05-06: qty 2

## 2024-05-06 MED ORDER — IPRATROPIUM BROMIDE 0.02 % IN SOLN
0.5000 mg | Freq: Four times a day (QID) | RESPIRATORY_TRACT | Status: DC
  Administered 2024-05-07 – 2024-05-08 (×5): 0.5 mg via RESPIRATORY_TRACT
  Filled 2024-05-06 (×5): qty 2.5

## 2024-05-06 NOTE — H&P (Signed)
 History and Physical    EDWORD CU FMW:[id number] DOB: Sep 19, 1945 DOA: 05/06/2024  PCP: Candise [name], MD   Patient coming from: Transfer from [facility] ED   Chief Complaint:  Chief Complaint  Patient presents with   Shortness of Breath    HPI:  Matthew Hall is a 78 y.o. 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      Recent hx of teeth extraction, treated with course of clindamycin. And had recent onset of fevers, PCP concerned about possible pneumonia and Rx'd for azithromycin  10/24. Per outside ED mainly c/o shortness of breath in addition to productive cough, fever, body aches, and congestion. On my interview mostly c/o generalized weakness since his dental procedure. He did not endorse much issues with SOB / Cough, although he was coughing during interview and appears to have some tachypnea. Denies any new rash. Not on steroids PTA. No improvement with Azithromycin  as outpatient   Review of Systems:  ROS complete and negative except as marked above   Allergies  Allergen Reactions   Simvastatin Rash   Cortisone Nausea And Vomiting   Albuterol  Palpitations and Other (See Comments)    Pt is okay to take xopenex , albuterol  raises his heart rate   Augmentin [Amoxicillin-Pot Clavulanate] Rash   Ciprofloxacin Rash   Contrast Media [Iodinated Contrast Media] Rash   Flagyl  [Metronidazole ] Rash   Latex Hives and Rash   Sulfa  Antibiotics Rash    Prior to Admission medications   Medication Sig Start Date End Date Taking? Authorizing Provider  Acetaminophen   (TYLENOL ) 325 MG CAPS Take 1-2 capsules by mouth every 6 (six) hours as needed (pain).    [provider]  ALPRAZolam  (XANAX ) 0.25 MG tablet Take 1 tablet (0.25 mg total) by mouth 2 (two) times daily as needed for anxiety or sleep. 02/08/24 06/02/24  McGowenAleene DEL, MD  apixaban  (ELIQUIS ) 5 MG TABS tablet Take 1 tablet (5 mg total) by mouth 2 (two) times daily. 03/27/24   [name], [name], MD  azithromycin  (ZITHROMAX ) 250 MG tablet Take 2 tablets by mouth on day 1 and 1 tablet daily of days 2-4 05/02/24   [name], [name], MD  budesonide -glycopyrrolate -formoterol  (BREZTRI  AEROSPHERE) 160-9-4.8 MCG/ACT AERO inhaler Inhale 2 puffs into the lungs 2 (two) times daily. 09/10/23   [name], [name], PA  busPIRone  (BUSPAR ) 10 MG tablet Take 1 tablet (10 mg total) by mouth 2 (two) times daily. 11/19/23   [name], [name], PA  Cyanocobalamin  (VITAMIN B12 PO) Take 1 tablet by mouth daily.    [provider]  feeding supplement (ENSURE ENLIVE / ENSURE PLUS) LIQD Take 237 mLs by mouth 2 (two) times daily between meals. 07/15/23   [name], [name], MD  guaiFENesin  (MUCINEX ) 600 MG 12 hr tablet Take 1 tablet (600 mg total) by mouth 2 (two) times daily. 10/03/23   [name], [name], PA  hydrochlorothiazide  (HYDRODIURIL ) 12.5 MG tablet Take 1 tablet (12.5 mg total) by mouth as needed. 10/03/23   [name], [name], PA  HYDROcodone -acetaminophen  (NORCO/VICODIN) 5-325 MG tablet Take 1 tablet by mouth every 4 (four) to 6 (six) hours as needed for  pain. Patient taking differently: Take 0.5 tablets by mouth every 4 (four) hours as needed. 04/10/24     levalbuterol  (XOPENEX  HFA) 45 MCG/ACT inhaler Inhale 2 puffs into the lungs every 6 (six) hours as needed for wheezing. 10/01/23 09/30/24  [name], [name], NP  levalbuterol  (XOPENEX ) 0.63 MG/3ML nebulizer solution Take 3 mLs (0.63 mg total) by nebulization every 6 (six) hours as needed for wheezing or shortness of breath. 07/17/23   [name], [name], NP  metFORMIN   (GLUCOPHAGE -XR) 500 MG 24 hr tablet Take 1 tablet (500 mg total) by mouth daily. 02/14/24   [name], [name], PA  metoprolol  succinate (TOPROL -XL) 25 MG 24 hr tablet Take 1 tablet (25 mg total) by mouth 2 (two) times daily. 01/17/24   Debera [name], MD  omeprazole  (PRILOSEC) 20 MG capsule Take 1 capsule (20 mg total) by mouth daily. 09/10/23   [name], [name], PA  ondansetron  (ZOFRAN ) 4 MG tablet Take 1 tablet (4 mg total) by mouth 3 (three) times daily as needed for nausea/vomiting. 12/21/23     triamcinolone  (NASACORT ) 55 MCG/ACT AERO nasal inhaler Place 1 spray into the nose daily. 10/03/23   Lowella Benton CROME, PA    Past Medical History:  Diagnosis Date   Anxiety    Aortic atherosclerosis    Atrial fibrillation (HCC)    Bladder cancer (HCC)    Chronic systolic heart failure (HCC)    Colon polyps    COPD (chronic obstructive pulmonary disease) (HCC)    Diabetes mellitus with complication (HCC)    Diabetic peripheral neuropathy (HCC) 12/27/2020   Diverticulosis    Essential hypertension    GERD (gastroesophageal reflux disease)    History of CVA with residual deficit    R foot drop   History of pneumonia    Nephrolithiasis    OSA (obstructive sleep apnea)    Osteoarthritis    Pulmonary nodule    surveillance by Dr. 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 Vernice who felt like EF was 55 to 60% and no impairment of LV function, nl RV fxn, Mod tricusp regurg.    TRANSURETHRAL RESECTION OF BLADDER TUMOR  1996   VIDEO BRONCHOSCOPY N/A 06/19/2023   Procedure: VIDEO BRONCHOSCOPY WITHOUT FLUORO;  Surgeon: Gretta Leita SQUIBB, DO;  Location: [facility];  Service: Cardiopulmonary;  Laterality: N/A;     reports that he quit smoking about 12 years ago. His smoking use included cigarettes. He started smoking about 42 years ago. He has a 30 pack-year smoking history. He has never used smokeless tobacco. He reports that he does not drink alcohol  and does not use drugs.  Family History  Problem Relation Age of Onset   Diabetes Mother    CAD Mother    Emphysema Father    Diabetes Sister    Stroke Sister    Lung cancer Brother  Liver cancer Brother    Colon cancer Neg Hx      Physical Exam: Vitals:   05/06/24 2100 05/06/24 2130 05/06/24 2200 05/06/24 2229  BP: (!) 158/83 (!) 145/88  (!) 148/82  Pulse: 92 98  100  Resp: (!) 28 (!) 27  18  Temp:    98.9 F (37.2 C)  TempSrc:   (P) Oral Oral  SpO2: 98% 97%  97%  Weight:    62 kg    Gen: Awake, alert, chronically ill appearing   HEENT: Post dental extraction he appears well healed, no areas appear actively inflammed  CV: Regular, normal S1, S2, no murmurs  Resp: Mild increased WOB, tachypneic, there are diffuse rales.  Abd: Flat, normoactive, nontender MSK: Symmetric, trace edema near ankles.  Skin: Rare petechiae most in the proximal portion of the lower leg bilaterally, pic attached in media. No other rahes or lesions to exposed skin.  Neuro: Alert and interactive  Psych: euthymic, appropriate    Data review:   Labs reviewed, notable for:   ProBNP 5188  HS trop < 15 -> 21  WBC 13  UA bland, tr protein, no RBC    Micro:  Results for orders placed or performed during the hospital encounter of 05/06/24  Resp panel by RT-PCR (RSV, Flu A&B, Covid) Anterior Nasal Swab     Status: None   Collection Time: 05/06/24  6:16 PM   Specimen: Anterior Nasal Swab  Result Value Ref Range Status    SARS Coronavirus 2 by RT PCR NEGATIVE NEGATIVE Final    Comment: (NOTE) SARS-CoV-2 target nucleic acids are NOT DETECTED.  The SARS-CoV-2 RNA is generally detectable in upper respiratory specimens during the acute phase of infection. The lowest concentration of SARS-CoV-2 viral copies this assay can detect is 138 copies/mL. A negative result does not preclude SARS-Cov-2 infection and should not be used as the sole basis for treatment or other patient management decisions. A negative result may occur with  improper specimen collection/handling, submission of specimen other than nasopharyngeal swab, presence of viral mutation(s) within the areas targeted by this assay, and inadequate number of viral copies(<138 copies/mL). A negative result must be combined with clinical observations, patient history, and epidemiological information. The expected result is Negative.  Fact Sheet for Patients:  bloggercourse.com  Fact Sheet for Healthcare Providers:  seriousbroker.it  This test is no t yet approved or cleared by the [place]  FDA and  has been authorized for detection and/or diagnosis of SARS-CoV-2 by FDA under an Emergency Use Authorization (EUA). This EUA will remain  in effect (meaning this test can be used) for the duration of the COVID-19 declaration under Section 564(b)(1) of the Act, 21 U.S.C.section 360bbb-3(b)(1), unless the authorization is terminated  or revoked sooner.       Influenza A by PCR NEGATIVE NEGATIVE Final   Influenza B by PCR NEGATIVE NEGATIVE Final    Comment: (NOTE) The Xpert Xpress SARS-CoV-2/FLU/RSV plus assay is intended as an aid in the diagnosis of influenza from Nasopharyngeal swab specimens and should not be used as a sole basis for treatment. Nasal washings and aspirates are unacceptable for Xpert Xpress SARS-CoV-2/FLU/RSV testing.  Fact Sheet for  Patients: bloggercourse.com  Fact Sheet for Healthcare Providers: seriousbroker.it  This test is not yet approved or cleared by the [place]  FDA and has been authorized for detection and/or diagnosis of SARS-CoV-2 by FDA under an Emergency Use Authorization (EUA). This EUA will remain in effect (meaning this test can  be used) for the duration of the COVID-19 declaration under Section 564(b)(1) of the Act, 21 U.S.C. section 360bbb-3(b)(1), unless the authorization is terminated or revoked.     Resp Syncytial Virus by PCR NEGATIVE NEGATIVE Final    Comment: (NOTE) Fact Sheet for Patients: bloggercourse.com  Fact Sheet for Healthcare Providers: seriousbroker.it  This test is not yet approved or cleared by the [place]  FDA and has been authorized for detection and/or diagnosis of SARS-CoV-2 by FDA under an Emergency Use Authorization (EUA). This EUA will remain in effect (meaning this test can be used) for the duration of the COVID-19 declaration under Section 564(b)(1) of the Act, 21 U.S.C. section 360bbb-3(b)(1), unless the authorization is terminated or revoked.  Performed at [facility], [address] [place], [place] [address]     Imaging reviewed:  DG Chest Portable 1 View Result Date: 05/06/2024 EXAM: 1 VIEW(S) XRAY OF THE CHEST 05/06/2024 06:50:00 PM COMPARISON: Chest x-ray 04/30/2024. CT of the chest 12/17/2023. CLINICAL HISTORY: Short of breath. Independent living had CNA check on him. Seen last week for dehydration. Returns this week- decreased oral intake-SOB-coughing. Low grade fever. FINDINGS: LUNGS AND PLEURA: Scattered chronic appearing reticular opacities are again seen. No new focal lung infiltrates. No pulmonary edema. No pleural effusion. No pneumothorax. HEART AND MEDIASTINUM: No acute abnormality of the cardiac and mediastinal  silhouettes. BONES AND SOFT TISSUES: No acute osseous abnormality. IMPRESSION: 1. No acute process. 2. Scattered chronic appearing reticular opacities, stable compared to prior studies. Electronically signed by: Greig Pique MD 05/06/2024 07:14 PM EDT RP Workstation: [id number]    EKG:  Personally reviewed, artificant limiting interpretation, likely afib, flutter with intermittent abberantly conducted beats, rate is 108, RAD, IVCD, PRWP, no overt ischemic changes     ED Course:  Treated with Ceftriaxone , Doxycycline , Lasix  20 mg IV, xopenex     Assessment/Plan:  78 y.o. 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 He has rales bilaterally likely from underlying lung disease, no significant wheezing. On home O2 2L, however tacypneic in the high 20s, low 30s, intermittently tachycardic, borderline temp 37.6C. Flu / COVID / RSV is negative. CXR demonstrates reticular opacities which are chronic.  -- Routine pulm consult in AM considering complex underlying lung disease  -- CT chest to better elucidate acute process considering his significant underlying lung disease  -- Broaden Abx to Cefepime  considering his structural lung disease; consider addition  of vancomycin  or stenotrophomonas coverage with Minocycline  or bactrim  (FQ resistant in past) if worsening course  -- Hold off on steroids as does not seem to have much wheezing  -- Received diuresis at OSH with concern for HF, doubt related to HF exacerbation, hold on further diuresis. Suspect BNP up in setting of strain from his underlying respiratory process,. Check echo.   -- Check Sputum culture, MRSA nares, RVP, AFB considering  prior concern for NTB disease; routine isolation precations OK.  -- Check ESR, CRP, ANA, ANCA, C3/4 with his prior hx vasculitis  -- Continue home Breztri . saline neb BID, Duonebs (sub alb for xopenex ) q6 hr, IS, guaifenisen, OOB to chair during the day  -- Home O2 desat screen prior to discharge   Acute myocardial injury  Minimal elevation just above reference limit < 15 -> 21 hs troponin. EKG is limited by artifact but no overt ischemic changes. And he has no component of chest pain. Suspect demand in setting of his acute respiratory process above.  -- Management directed at his respiratory process   Recent dental extraction  -- No signs of complication on exam, well healed.   Deconditioning  -- PT evaluation   Chronic medical problems:  OSA: will check if using CPAP  Afib: Currently in Afib, continue home eliquis , metoprolol    HFmrEF: Last TTE 10/'24 LVEF 40-45%, indeterminate diastolic function, moderate band of RV, moderately enlarged RV with normal RV systolic function, severe RA dilation, mod TR. See above, proBNP elevated, treated with Lasix  [facility] at [facility] ED, doubt HF exacerbation clinically. Check BNP, echo. Low Na diet  CVA: On DOAC per above  DM2: Home regimen metformin . Not currently requiring ssi can add if he runs high, daily POCT  HTN: On Bblocker per above  GERD: sub home PPI  Cutaneous vasculitis: Scant petechiae, doubt active, continue to monitor. See pic in media.  Mononeuritis multiplex: Noted, has seen neurology for foot drop  MCI: Noted,  not on medication  Anxiety: Continue home Xanax  BID prn, buspar .  Remote hx bladder CA: remote TURBT, OP f/u   Body mass index is 19.07 kg/m. Underweight    DVT prophylaxis:  Eliquis  Code Status:  Full Code Diet:  Diet Orders (From admission, onward)     Start     Ordered   05/06/24 1831  Diet NPO time specified  (Undifferentiated presentation (screening labs and basic nursing orders))  Diet effective now        05/06/24 1831           Family Communication:  None   Consults:  None   Admission status:   Inpatient, Step Down Unit  Severity of Illness: The appropriate patient status for this patient is INPATIENT. Inpatient status is judged to be reasonable and necessary in order to provide the required intensity of service to ensure the patient's safety. The patient's presenting symptoms, physical exam findings, and initial radiographic and laboratory data in the context of their chronic comorbidities is felt to place them at high risk for further clinical deterioration. Furthermore, it is not anticipated that the patient will be medically stable for discharge from the hospital within 2 midnights of admission.   * I certify that at the point of admission it is my clinical judgment that the patient will require inpatient hospital care spanning beyond 2 midnights from the point of admission due to high intensity of service, high risk for further deterioration and high frequency of surveillance required.*   Dorn Dawson, MD [facility]  How to contact the [facility] Attending or Consulting provider 7A - 7P or covering provider during after hours 7P -7A, for this patient.  Check the care team in [facility] and look for a) attending/consulting [facility] provider listed and b) the [facility] team listed Log into [email or web address] and use [place]'s universal password to access. If you do not have the password, please contact the hospital operator. Locate the [facility] provider  you are looking for under [facility] and page to a number that you can be directly reached. If you still have difficulty reaching the provider, please page the [facility] (Director on Call) for the Hospitalists listed on amion for assistance.  05/06/2024, 10:54 PM

## 2024-05-06 NOTE — ED Triage Notes (Signed)
 Independent living had CNA check on him. Seen last week for dehydration. Returns this week- decreased oral intake-SOB-coughing. Low grade fever.

## 2024-05-06 NOTE — ED Provider Notes (Signed)
 [place] EMERGENCY DEPARTMENT AT [facility] Provider Note   CSN: [id number] Arrival date & time: 05/06/24  1754     Patient presents with: Shortness of Breath   MUHSIN DORIS is a 78 y.o. male.  Patient with past history significant for atrial fibrillation, COPD, type 2 diabetes, chronic respiratory failure with hypoxia, chronic heart failure presents emergency department concerns of shortness of breath.  Reports that he was seen in the emergency department recently and was advised that he was dehydrated.  States that he saw his PCP earlier this week and was started on azithromycin  for concerns of pneumonia.  He endorses productive cough, subjective fevers, body aches, and congestion.  Endorses increased difficulty breathing in all positions.  States he has been compliant with his Eliquis  and his hydrochlorothiazide .  He is not currently on any other diuretic.  Denies any appreciable weight gain.   Shortness of Breath      Prior to Admission medications   Medication Sig Start Date End Date Taking? Authorizing Provider  Acetaminophen  (TYLENOL ) 325 MG CAPS Take 1-2 capsules by mouth every 6 (six) hours as needed (pain).    [provider]  ALPRAZolam  (XANAX ) 0.25 MG tablet Take 1 tablet (0.25 mg total) by mouth 2 (two) times daily as needed for anxiety or sleep. 02/08/24 06/02/24  McGowenAleene DEL, MD  apixaban  (ELIQUIS ) 5 MG TABS tablet Take 1 tablet (5 mg total) by mouth 2 (two) times daily. 03/27/24   [name], [name], MD  azithromycin  (ZITHROMAX ) 250 MG tablet Take 2 tablets by mouth on day 1 and 1 tablet daily of days 2-4 05/02/24   [name], [name], MD  budesonide -glycopyrrolate -formoterol  (BREZTRI  AEROSPHERE) 160-9-4.8 MCG/ACT AERO inhaler Inhale 2 puffs into the lungs 2 (two) times daily. 09/10/23   [name], [name], PA  busPIRone  (BUSPAR ) 10 MG tablet Take 1 tablet (10 mg total) by mouth 2 (two) times daily. 11/19/23   [name], [name], PA  Cyanocobalamin   (VITAMIN B12 PO) Take 1 tablet by mouth daily.    [provider]  feeding supplement (ENSURE ENLIVE / ENSURE PLUS) LIQD Take 237 mLs by mouth 2 (two) times daily between meals. 07/15/23   [name], [name], MD  guaiFENesin  (MUCINEX ) 600 MG 12 hr tablet Take 1 tablet (600 mg total) by mouth 2 (two) times daily. 10/03/23   [name], [name], PA  hydrochlorothiazide  (HYDRODIURIL ) 12.5 MG tablet Take 1 tablet (12.5 mg total) by mouth as needed. 10/03/23   [name], [name], PA  HYDROcodone -acetaminophen  (NORCO/VICODIN) 5-325 MG tablet Take 1 tablet by mouth every 4 (four) to 6 (six) hours as needed for pain. Patient taking differently: Take 0.5 tablets by mouth every 4 (four) hours as needed. 04/10/24     levalbuterol  (XOPENEX  HFA) 45 MCG/ACT inhaler Inhale 2 puffs into the lungs every 6 (six) hours as needed for wheezing. 10/01/23 09/30/24  [name], [name], NP  levalbuterol  (XOPENEX ) 0.63 MG/3ML nebulizer solution Take 3 mLs (0.63 mg total) by nebulization every 6 (six) hours as needed for wheezing or shortness of breath. 07/17/23   [name], [name], NP  metFORMIN  (GLUCOPHAGE -XR) 500 MG 24 hr tablet Take 1 tablet (500 mg total) by mouth daily. 02/14/24   [name], [name], PA  metoprolol  succinate (TOPROL -XL) 25 MG 24 hr tablet Take 1 tablet (25 mg total) by mouth 2 (two) times daily. 01/17/24   Debera [name], MD  omeprazole  (PRILOSEC) 20 MG capsule Take 1 capsule (20 mg total) by mouth daily. 09/10/23  [name], [name], PA  ondansetron  (ZOFRAN ) 4 MG tablet Take 1 tablet (4 mg total) by mouth 3 (three) times daily as needed for nausea/vomiting. 12/21/23     triamcinolone  (NASACORT ) 55 MCG/ACT AERO nasal inhaler Place 1 spray into the nose daily. 10/03/23   [name], [name], PA    Allergies: Simvastatin, Cortisone, Albuterol , Augmentin [amoxicillin-pot clavulanate], Ciprofloxacin, Contrast media [iodinated contrast media], Flagyl  [metronidazole ], Latex, and Sulfa  antibiotics    Review of Systems   Respiratory:  Positive for shortness of breath.   All other systems reviewed and are negative.   Updated Vital Signs BP (!) 145/88   Pulse 98   Temp 99.7 F (37.6 C) (Oral)   Resp (!) 27   SpO2 97%   Physical Exam Vitals and nursing note reviewed.  Constitutional:      General: He is not in acute distress.    Appearance: He is well-developed.  HENT:     Head: Normocephalic and atraumatic.  Eyes:     Conjunctiva/sclera: Conjunctivae normal.  Cardiovascular:     Rate and Rhythm: Normal rate and regular rhythm.     Heart sounds: No murmur heard. Pulmonary:     Effort: Pulmonary effort is normal. No respiratory distress.     Breath sounds: Examination of the right-middle field reveals rales. Examination of the right-lower field reveals rales. Rhonchi and rales present. No decreased breath sounds or wheezing.  Abdominal:     Palpations: Abdomen is soft.     Tenderness: There is no abdominal tenderness.  Musculoskeletal:        General: No swelling.     Cervical back: Neck supple.  Skin:    General: Skin is warm and dry.     Capillary Refill: Capillary refill takes less than 2 seconds.  Neurological:     Mental Status: He is alert.  Psychiatric:        Mood and Affect: Mood normal.     (all labs ordered are listed, but only abnormal results are displayed) Labs Reviewed  CBC - Abnormal; Notable for the following components:      Result Value   WBC 13.5 (*)    RBC 3.65 (*)    Hemoglobin 10.4 (*)    HCT 32.4 (*)    Platelets 510 (*)    All other components within normal limits  COMPREHENSIVE METABOLIC PANEL WITH GFR - Abnormal; Notable for the following components:   Sodium 134 (*)    Chloride 96 (*)    Glucose, Bld 100 (*)    Albumin 3.2 (*)    Alkaline Phosphatase 140 (*)    All other components within normal limits  PROTIME-INR - Abnormal; Notable for the following components:   Prothrombin Time 19.6 (*)    INR 1.6 (*)    All other components within normal  limits  URINALYSIS, W/ REFLEX TO CULTURE (INFECTION SUSPECTED) - Abnormal; Notable for the following components:   Protein, ur TRACE (*)    All other components within normal limits  PRO BRAIN NATRIURETIC PEPTIDE - Abnormal; Notable for the following components:   Pro Brain Natriuretic Peptide 5,188.0 (*)    All other components within normal limits  TROPONIN T, HIGH SENSITIVITY - Abnormal; Notable for the following components:   Troponin T High Sensitivity 21 (*)    All other components within normal limits  RESP PANEL BY RT-PCR (RSV, FLU A&B, COVID)  RVPGX2  CULTURE, BLOOD (ROUTINE X 2)  CULTURE, BLOOD (ROUTINE X 2)  LACTIC ACID,  PLASMA  CBG MONITORING, ED  TROPONIN T, HIGH SENSITIVITY    EKG: None  Radiology: DG Chest Portable 1 View Result Date: 05/06/2024 EXAM: 1 VIEW(S) XRAY OF THE CHEST 05/06/2024 06:50:00 PM COMPARISON: Chest x-ray 04/30/2024. CT of the chest 12/17/2023. CLINICAL HISTORY: Short of breath. Independent living had CNA check on him. Seen last week for dehydration. Returns this week- decreased oral intake-SOB-coughing. Low grade fever. FINDINGS: LUNGS AND PLEURA: Scattered chronic appearing reticular opacities are again seen. No new focal lung infiltrates. No pulmonary edema. No pleural effusion. No pneumothorax. HEART AND MEDIASTINUM: No acute abnormality of the cardiac and mediastinal silhouettes. BONES AND SOFT TISSUES: No acute osseous abnormality. IMPRESSION: 1. No acute process. 2. Scattered chronic appearing reticular opacities, stable compared to prior studies. Electronically signed by: Greig Pique MD 05/06/2024 07:14 PM EDT RP Workstation: [id number]     Procedures   Medications Ordered in the ED  levalbuterol  (XOPENEX ) 0.63 MG/3ML nebulizer solution (  Not Given 05/06/24 1848)  levalbuterol  (XOPENEX ) nebulizer solution 0.63 mg (0.63 mg Nebulization Given 05/06/24 1844)  furosemide  (LASIX ) injection 40 mg (40 mg Intravenous Given 05/06/24 2020)   cefTRIAXone  (ROCEPHIN ) 1 g in sodium chloride  0.9 % 100 mL IVPB (0 g Intravenous Stopped 05/06/24 2101)  doxycycline  (VIBRA -TABS) tablet 100 mg (100 mg Oral Given 05/06/24 2019)                                    Medical Decision Making Amount and/or Complexity of Data Reviewed Labs: ordered. Radiology: ordered.  Risk Prescription drug management. Decision regarding hospitalization.   This patient presents to the ED for concern of shortness of breath, fever, this involves an extensive number of treatment options, and is a complaint that carries with it a high risk of complications and morbidity.  The differential diagnosis includes COVID-19, viral URI, pneumonia, bronchitis, PE, CHF, ACS   Co morbidities that complicate the patient evaluation  Atrial fibrillation, COPD, type 2 diabetes, chronic respiratory failure with hypoxia, chronic heart failure   Additional history obtained:  Additional history obtained from epic chart review   Lab Tests:  I Ordered, and personally interpreted labs.  The pertinent results include: CBC with leukocytosis at 13.5, CMP without evidence of AKI or renal dysfunction, lactic acid normal at 1.2, UA without signs of infection, respiratory panel negative, proBNP elevated at 5188, troponin elevated at 21 after initial was normal at less than 15   Imaging Studies ordered:  I ordered imaging studies including chest x-ray I independently visualized and interpreted imaging which showed no acute cardiopulmonary process I agree with the radiologist interpretation   Cardiac Monitoring: / EKG:  The patient was maintained on a cardiac monitor.  I personally viewed and interpreted the cardiac monitored which showed an underlying rhythm of: Atrial fibrillation   Consultations Obtained:  I requested consultation with the hospitalist,  and discussed lab and imaging findings as well as pertinent plan - they recommend: Spoke with Dr. Dominique or,  hospitalist, who will be admitting patient.   Problem List / ED Course / Critical interventions / Medication management  Patient with past history significant for COPD, atrial fibrillation, chronic respiratory failure with hypoxia, chronic heart failure, type 2 diabetes present to the emergency department today with concerns of shortness of breath.  Reports has been seen in the emergency department and with his PCP recently and reportedly started on azithromycin  for suspected pneumonia.  He endorses  ongoing productive cough, low-grade fevers, increasing shortness of breath at all times.  States that he has been compliant with his Eliquis  and his hydrochlorothiazide . On exam, patient is sick.  He is tachypneic but mentating fine.  SpO2 remained in the lower 90% on 2 L nasal cannula, increased to 3 L.  He is hypoxic on room air.  No focal abdominal tenderness.  Lung sounds appear to show cracking primarily towards the right mid and lower lungs.  Left side unremarkable.  No wheezing heard. Patient's workup is mostly concerning for elevated BNP at 5100.  Concern for possible acute on chronic CHF.  Patient appears to be euvolemic on exam with only 1+ pitting edema bilateral lower extremities.  Will initiate Lasix .  Prophylactic antibiotics including Rocephin  and doxycycline  initiated.  Consult hospitalist placed. Spoke with Dr. Madelyn for, hospitalist, will be admitting patient. I ordered medication including levalbuterol , Rocephin , doxycycline , Lasix  for COPD, suspected pneumonia, CHF Reevaluation of the patient after these medicines showed that the patient improved I have reviewed the patients home medicines and have made adjustments as needed   Social Determinants of Health:  None   Test / Admission - Considered:  Patient requiring admission.  Final diagnoses:  Acute on chronic congestive heart failure, unspecified heart failure type [facility])  Productive cough    ED Discharge Orders     None           Cecily Legrand DELENA DEVONNA 05/06/24 2154    Jerrol Agent, MD 05/07/24 1224

## 2024-05-06 NOTE — ED Notes (Signed)
 Valorie (caregiver)  [phone]  Keenen Roessner (wife)  [phone]

## 2024-05-06 NOTE — Plan of Care (Signed)
 Plan of Care Note for accepted transfer   Patient name: Matthew Hall FMW:[id number] DOB: [birth year]  Facility requesting transfer: [facility] Requesting Provider: Cecily Legrand DELENA DEVONNA   Facility course: 78 year old male with history of A-fib on Eliquis , HFmrEF, COPD on 2L home oxygen , diabetes, hypertension, GERD, CVA, OSA, anxiety, bladder cancer presenting with shortness breath and cough.  Tachycardic and tachypneic on arrival.  Oxygen  saturation 87% on room air, improved with 3 L [place].  WBC count 13.5, hemoglobin 10.4 (baseline 12-13 range), platelet count 510k, sodium 134, troponin negative x 1, COVID/influenza/RSV PCR negative, lactic acid normal, proBNP 5188.  Chest x-ray showing no acute process.  Showing scattered chronic appearing reticular opacities which appear stable to prior studies.  Patient was given doxycycline , ceftriaxone , IV Lasix  40 mg, and Xopenex  neb treatment.  Given no pulmonary edema on chest x-ray and elevated proBNP, PE is on the differential.  ED PA spoke to the patient and he reported compliance with Eliquis .  CTA chest not done as patient is allergic to IV contrast and was not sure if he did okay with premedicated CTs in the past and told ED PA that he almost died after a scan when he was at [facility] in the past.  Plan of care: The patient is accepted for admission to Progressive unit at [facility].  [facility] will assume care on arrival to accepting facility. Until arrival, care as per EDP. However, [facility] available 24/7 for questions and assistance.  Check [email or web address] for on-call coverage.  Nursing staff, please call [facility] Admits & Consults System-Wide number under Amion on patient's arrival so appropriate admitting provider can evaluate the pt.

## 2024-05-06 NOTE — ED Notes (Signed)
Daughter updated as to patient's status. 

## 2024-05-06 NOTE — ED Notes (Signed)
 Updated Valorie (caregiver) on patients status and room assignment. Attempted to update wife without an answer.

## 2024-05-07 ENCOUNTER — Inpatient Hospital Stay (HOSPITAL_COMMUNITY)

## 2024-05-07 ENCOUNTER — Encounter (HOSPITAL_COMMUNITY): Payer: Self-pay

## 2024-05-07 DIAGNOSIS — J9621 Acute and chronic respiratory failure with hypoxia: Secondary | ICD-10-CM | POA: Diagnosis not present

## 2024-05-07 DIAGNOSIS — J479 Bronchiectasis, uncomplicated: Secondary | ICD-10-CM | POA: Diagnosis not present

## 2024-05-07 DIAGNOSIS — I509 Heart failure, unspecified: Secondary | ICD-10-CM

## 2024-05-07 LAB — BASIC METABOLIC PANEL WITH GFR
Anion gap: 13 (ref 5–15)
BUN: 16 mg/dL (ref 8–23)
CO2: 29 mmol/L (ref 22–32)
Calcium: 8.9 mg/dL (ref 8.9–10.3)
Chloride: 90 mmol/L — ABNORMAL LOW (ref 98–111)
Creatinine, Ser: 0.8 mg/dL (ref 0.61–1.24)
GFR, Estimated: >60 mL/min (ref >60)
Glucose, Bld: 159 mg/dL — ABNORMAL HIGH (ref 70–99)
Potassium: 3.2 mmol/L — ABNORMAL LOW (ref 3.5–5.1)
Sodium: 132 mmol/L — ABNORMAL LOW (ref 135–145)

## 2024-05-07 LAB — RESPIRATORY PANEL BY PCR

## 2024-05-07 LAB — SEDIMENTATION RATE: Sed Rate: 87 mm/h — ABNORMAL HIGH (ref 0–16)

## 2024-05-07 LAB — CBC
HCT: 33 % — ABNORMAL LOW (ref 39.0–52.0)
Hemoglobin: 10.7 g/dL — ABNORMAL LOW (ref 13.0–17.0)
MCH: 28.6 pg (ref 26.0–34.0)
MCHC: 32.4 g/dL (ref 30.0–36.0)
MCV: 88.2 fL (ref 80.0–100.0)
Platelets: 564 K/uL — ABNORMAL HIGH (ref 150–400)
RBC: 3.74 MIL/uL — ABNORMAL LOW (ref 4.22–5.81)
RDW: 14.2 % (ref 11.5–15.5)
WBC: 14.2 K/uL — ABNORMAL HIGH (ref 4.0–10.5)
nRBC: 0 % (ref 0.0–0.2)

## 2024-05-07 LAB — PHOSPHORUS: Phosphorus: 2.2 mg/dL — ABNORMAL LOW (ref 2.5–4.6)

## 2024-05-07 LAB — PROCALCITONIN: Procalcitonin: <0.1 ng/mL

## 2024-05-07 LAB — C-REACTIVE PROTEIN: CRP: 21 mg/dL — ABNORMAL HIGH (ref <1.0)

## 2024-05-07 LAB — MRSA NEXT GEN BY PCR, NASAL: MRSA by PCR Next Gen: DETECTED — AB

## 2024-05-07 LAB — MAGNESIUM: Magnesium: 1.6 mg/dL — ABNORMAL LOW (ref 1.7–2.4)

## 2024-05-07 LAB — BRAIN NATRIURETIC PEPTIDE: B Natriuretic Peptide: 549.8 pg/mL — ABNORMAL HIGH (ref 0.0–100.0)

## 2024-05-07 MED ORDER — VANCOMYCIN HCL 1500 MG/300ML IV SOLN
1500.0000 mg | Freq: Once | INTRAVENOUS | Status: AC
  Administered 2024-05-07: 1500 mg via INTRAVENOUS
  Filled 2024-05-07: qty 300

## 2024-05-07 MED ORDER — POTASSIUM PHOSPHATES 15 MMOLE/5ML IV SOLN
30.0000 mmol | Freq: Once | INTRAVENOUS | Status: AC
  Administered 2024-05-07: 30 mmol via INTRAVENOUS
  Filled 2024-05-07: qty 10

## 2024-05-07 MED ORDER — VANCOMYCIN HCL 1250 MG/250ML IV SOLN
1250.0000 mg | INTRAVENOUS | Status: DC
  Administered 2024-05-08: 1250 mg via INTRAVENOUS
  Filled 2024-05-07: qty 250

## 2024-05-07 MED ORDER — MUPIROCIN 2 % EX OINT
1.0000 | TOPICAL_OINTMENT | Freq: Two times a day (BID) | CUTANEOUS | Status: AC
  Administered 2024-05-07 – 2024-05-11 (×10): 1 via NASAL
  Filled 2024-05-07: qty 22

## 2024-05-07 MED ORDER — CHLORHEXIDINE GLUCONATE CLOTH 2 % EX PADS
6.0000 | MEDICATED_PAD | Freq: Every day | CUTANEOUS | Status: AC
  Administered 2024-05-07 – 2024-05-11 (×5): 6 via TOPICAL

## 2024-05-07 MED ORDER — MAGNESIUM SULFATE 2 GM/50ML IV SOLN
2.0000 g | Freq: Once | INTRAVENOUS | Status: AC
  Administered 2024-05-07: 2 g via INTRAVENOUS
  Filled 2024-05-07: qty 50

## 2024-05-07 MED ORDER — FUROSEMIDE 10 MG/ML IJ SOLN
20.0000 mg | Freq: Once | INTRAMUSCULAR | Status: AC
  Administered 2024-05-07: 20 mg via INTRAVENOUS
  Filled 2024-05-07: qty 2

## 2024-05-07 NOTE — TOC Initial Note (Addendum)
 Transition of Care [facility]) - Initial/Assessment Note    Patient Details  Name: Matthew Hall MRN: [id number] Date of Birth: 1945-12-30  Transition of [facility]) CM/SW Contact:    Lauraine FORBES Saa, LCSWA Phone Number: 05/07/2024, 10:23 AM  Clinical Narrative:            10:23 AM Per chart review, patient resides at [facility] with his spouse. Patient has a PCP and insurance. Patient does not have SNF history. Patient has HH history with [facility] and [facility]. Patient has DME (oxygen , rollator, nebulizer) history with [facility]. Patient's preferred pharmacy's are [facility], [facility], [facility], [facility], and [facility] [phone] [place]. No TOC needs identified at this time. TOC will continue to follow.  4:05 PM Per Cpht, patient expressed interest in speaking with someone regarding hospital bill. CSW consulted financial counseling.   Expected Discharge Plan: Home/Self Care Barriers to Discharge: Continued Medical Work up   Patient Goals and CMS Choice            Expected Discharge Plan and Services   Discharge Planning Services: CM Consult   Living arrangements for the past 2 months: Independent Living Facility                                      Prior Living Arrangements/Services Living arrangements for the past 2 months: Independent Living Facility Lives with:: Spouse Patient language and need for interpreter reviewed:: Yes        Need for Family Participation in Patient Care: Yes (Comment) Care giver support system in place?: Yes (comment) Current home services: DME, Homehealth aide Criminal Activity/Legal Involvement Pertinent to Current Situation/Hospitalization: No - Comment as needed  Activities of Daily Living   ADL Screening (condition at time of admission) Independently performs ADLs?: Yes (appropriate for developmental age) Is  the patient deaf or have difficulty hearing?: Yes Does the patient have difficulty seeing, even when wearing glasses/contacts?: No Does the patient have difficulty concentrating, remembering, or making decisions?: No  Permission Sought/Granted Permission sought to share information with : Family Supports, Oceanographer granted to share information with : No (Contact information on chart)  Share Information with NAME: Stone Spirito  Permission granted to share info w AGENCY: [facility]  Permission granted to share info w Relationship: Daughter  Permission granted to share info w Contact Information: [phone]  Emotional Assessment Appearance:: Appears stated age       Alcohol  / Substance Use: Not Applicable Psych Involvement: No (comment)  Admission diagnosis:  Productive cough [R05.8] Acute on chronic respiratory failure with hypoxemia (HCC) [J96.21] Acute on chronic congestive heart failure, unspecified heart failure type (HCC) [I50.9] Patient Active Problem List   Diagnosis Date Noted   Acute on chronic respiratory failure with hypoxemia (HCC) 05/06/2024   Cognitive impairment 01/08/2024   Aphasia 10/28/2023   Lung nodule 10/28/2023   Chronic respiratory failure with hypoxia (HCC) 10/25/2023   Gait abnormality 09/24/2023   DM2 (diabetes mellitus, type 2) (HCC) 07/13/2023   GAD (generalized anxiety disorder) 07/13/2023   Chronic heart failure with mildly reduced ejection fraction (HFmrEF, 41-49%) (HCC) 07/13/2023   Malnutrition of moderate degree 07/13/2023   Paronychia of great toe of left foot 07/02/2023   Weakness of right foot 05/09/2023   History of CVA (cerebrovascular accident) 04/12/2023   Anxiety 04/12/2023  Diabetic peripheral neuropathy (HCC) 12/27/2020   Paresthesia 12/01/2020   Elevated sed rate 05/21/2017   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) 03/01/2017   Vasculitis 02/20/2017   Atrial fibrillation, chronic (HCC)  07/09/2016   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 [place] [address] Phone: [phone] Fax: [phone]  [phone] [place], [place] - [address] AT CORNER OF [facility] [address] [place] [address] Phone: [phone] Fax: [phone]  [facility] - [place], [place] - [address] [place] [address] Phone: [phone] Fax: [phone]  [facility] - [facility] [address] [place] [address] Phone: [phone] Fax: [phone]  [facility] [address] [place] [address] Phone: [phone] Fax: [phone]  [place] [facility] [address] [place] [address] Phone: [phone] Fax: [phone]     Social Drivers of Health (SDOH) Social History: SDOH Screenings   Food Insecurity: No Food Insecurity (05/06/2024)  Housing: Low Risk  (05/06/2024)  Transportation Needs: No Transportation Needs (05/06/2024)  Utilities: Not At Risk (05/06/2024)  Depression (PHQ2-9): Low Risk  (03/19/2024)  Social Connections: Moderately Isolated (05/06/2024)  Tobacco Use: Medium Risk (05/02/2024)   SDOH Interventions:     Readmission Risk Interventions    07/15/2023    2:01 PM 06/22/2023   11:49 AM  Readmission Risk Prevention Plan  Transportation Screening Complete Complete  Home Care Screening  Complete  Medication Review (RN CM)  Complete  Medication Review (RN Care Manager) Complete   PCP or Specialist appointment within 3-5 days of discharge Complete   HRI or Home Care Consult Complete   SW Recovery Care/Counseling Consult Complete   Palliative Care Screening Not Applicable   Skilled Nursing  Facility Not Applicable

## 2024-05-07 NOTE — Progress Notes (Signed)
 Heart Failure Navigator Progress Note  Assessed for Heart & Vascular TOC clinic readiness.  Patient does not meet criteria due to . No TOC - not suspecting HF exacerbation per MD. Thought to be more resp with h/o structural lung disease.   Navigator will sign off at this time.   Stephane Haddock, BSN, Scientist, Clinical (histocompatibility And Immunogenetics) Only

## 2024-05-07 NOTE — Progress Notes (Signed)
 PROGRESS NOTE    Matthew Hall  FMW:[id number] DOB: December 24, 1945 DOA: 05/06/2024 PCP: Candise [name], MD    Brief Narrative:  78 y.o. 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 He has rales bilaterally likely from underlying lung disease, no significant wheezing. On home O2 2L at night, however tacypneic in the high 20s, low 30s, intermittently tachycardic, borderline temp 37.6C. Flu / COVID / RSV is negative. CXR demonstrates reticular opacities which are chronic.  --pulm consult considering complex underlying lung disease  -- CT chest done -- Broaden Abx to Cefepime  considering his structural lung disease -- Hold off on steroids as does not seem to have much wheezing  -- Received diuresis at OSH with concern for HF, doubt related to HF exacerbation, hold on further diuresis. Suspect BNP up in setting of strain from his underlying respiratory process,. -echo pending -- Check Sputum culture, MRSA nares, RVP, AFB considering prior concern for NTB disease; routine isolation precations OK.  -- ESR, CRP-- elevated ANA, ANCA, C3/4 with his prior hx vasculitis are pending  -- Continue home Breztri . saline neb BID, Duonebs (sub alb for  xopenex ) q6 hr, IS, guaifenisen, OOB to chair during the day  -- wean O2 as able   Elevated troponin Minimal elevation just above reference limit < 15 -> 21 hs troponin. EKG is limited by artifact but no overt ischemic changes. And he has no component of chest pain. Suspect demand in setting of his acute respiratory process above.  -- Management directed at his respiratory process    Recent dental extraction  -- No signs of complication on exam, well healed.    Deconditioning  -- PT evaluation   Hypokalemia/hypomagnesemia/hypophosphatemia -replete   DVT prophylaxis:  apixaban  (ELIQUIS ) tablet 5 mg    Code Status: Full Code Family Communication: on phone  Disposition Plan:  Level of care: Progressive Status is: Inpatient     Consultants:  pulm   Subjective: Thinks his breathing is better  Objective: Vitals:   05/07/24 0700 05/07/24 0816 05/07/24 0817 05/07/24 1109  BP: 132/83 133/64  132/63  Pulse: 91 (!) 104 98 89  Resp: (!) 24  20 (!) 25  Temp: 99.3 F (37.4 C)   98.7 F (37.1 C)  TempSrc: Oral   Oral  SpO2: 94%  97% 98%  Weight:      Height:        Intake/Output Summary (Last 24 hours) at 05/07/2024 1219 Last data filed at 05/07/2024 0709 Gross per 24 hour  Intake 860 ml  Output 3100 ml  Net -2240 ml   Filed Weights   05/06/24 2229 05/07/24 0300  Weight: 62 kg 62 kg    Examination:   General: Appearance:    Thin male in no acute distress     Lungs:     respirations unlabored  Heart:    Normal heart rate..    MS:   All extremities are intact.    Neurologic:   Awake, alert       Data Reviewed: I have personally reviewed following labs and imaging studies  CBC: Recent Labs  Lab 04/30/24 2050 05/02/24 1214 05/06/24 1816 05/07/24 0303  WBC 14.5* 10.8* 13.5* 14.2*  NEUTROABS 10.7* 8.2*  --   --   HGB 13.2 10.9* 10.4* 10.7*  HCT 41.2 34.0* 32.4* 33.0*  MCV 89.6 88.2 88.8 88.2  PLT 536* 513.0* 510* 564*   Basic Metabolic  Panel: Recent Labs  Lab 04/30/24 2050 05/02/24 1214 05/06/24 1831 05/07/24 0303  NA 136 137 134* 132*  K 3.2* 3.6 4.3 3.2*  CL 95* 99 96* 90*  CO2 30 28 28 29   GLUCOSE 110* 120* 100* 159*  BUN 15 14 14 16   CREATININE 0.89 0.63 0.82 0.80  CALCIUM  9.9 8.4 9.2 8.9  MG  --   --   --  1.6*  PHOS  --   --   --  2.2*   GFR: Estimated Creatinine Clearance: 64.6 mL/min (by C-G formula based on SCr of 0.8 mg/dL). Liver Function Tests: Recent Labs  Lab 04/30/24 2050 05/06/24 1831  AST 50* 27  ALT 39 23  ALKPHOS 88 140*  BILITOT 0.7 0.6  PROT 8.2* 7.2  ALBUMIN 3.8 3.2*   No results for input(s): LIPASE, AMYLASE in the last 168 hours. No results for input(s): AMMONIA in the last 168 hours. Coagulation Profile: Recent Labs  Lab 05/06/24 1831  INR 1.6*   Cardiac Enzymes: No results for input(s): CKTOTAL, CKMB, CKMBINDEX, TROPONINI in the last 168 hours. BNP (last 3 results) Recent Labs    09/10/23 1513 05/06/24 1831  PROBNP 609* 5,188.0*   HbA1C: No results for input(s): HGBA1C in the last 72 hours. CBG: Recent Labs  Lab 05/06/24 1809 05/06/24 2232  GLUCAP 95 110*   Lipid Profile: No results for input(s): CHOL, HDL, LDLCALC, TRIG, CHOLHDL, LDLDIRECT in the last 72 hours. Thyroid  Function Tests: No results for input(s): TSH, T4TOTAL, FREET4, T3FREE, THYROIDAB in the last 72 hours. Anemia Panel: No results for input(s): VITAMINB12, FOLATE, FERRITIN, TIBC, IRON, RETICCTPCT in the last 72 hours. Sepsis Labs: Recent Labs  Lab 04/30/24 2050 05/06/24 1831 05/07/24 0303  PROCALCITON  --   --  <0.10  LATICACIDVEN 1.2 1.2  --     Recent Results (from the past 240 hours)  Blood Culture (routine x 2)     Status: None   Collection Time: 04/30/24  8:49 PM   Specimen: BLOOD  Result Value Ref Range Status   Specimen Description   Final    BLOOD RIGHT ANTECUBITAL Performed at [facility], [address], [place], [place] [address]    Special Requests   Final    BOTTLES DRAWN AEROBIC AND ANAEROBIC Blood Culture adequate volume Performed at [facility], [address], [place], [place] [address]    Culture   Final    NO GROWTH 5 DAYS Performed at [facility], [address], [place], [place] [address]    Report Status 05/05/2024 FINAL  Final  Blood Culture (routine x 2)     Status: None   Collection Time: 04/30/24  8:50 PM   Specimen: BLOOD  Result Value Ref Range Status   Specimen Description   Final    BLOOD LEFT ANTECUBITAL Performed at [facility], [address] [place],  [place] [address]    Special Requests   Final    BOTTLES DRAWN AEROBIC AND ANAEROBIC Blood Culture adequate volume Performed at [facility], [address], [place], [place] [address]    Culture   Final    NO GROWTH 5 DAYS Performed at [facility], [address], [place], [place] [address]    Report Status 05/05/2024 FINAL  Final  Resp panel by RT-PCR (RSV, Flu A&B, Covid)     Status: None   Collection Time: 04/30/24  8:50 PM   Specimen: Nasal Swab  Result Value Ref Range Status   SARS Coronavirus 2 by RT PCR NEGATIVE NEGATIVE Final    Comment: (NOTE) SARS-CoV-2 target nucleic acids are NOT DETECTED.  The SARS-CoV-2 RNA is generally detectable in upper respiratory specimens during the acute phase of infection. The lowest concentration of SARS-CoV-2 viral copies this assay can detect is 138 copies/mL. A negative result does not preclude SARS-Cov-2 infection and should not be used as the sole basis for treatment or other patient management decisions. A negative result may occur with  improper specimen collection/handling, submission of specimen other than nasopharyngeal swab, presence of viral mutation(s) within the areas targeted by this assay, and inadequate number of viral copies(<138 copies/mL). A negative  result must be combined with clinical observations, patient history, and epidemiological information. The expected result is Negative.  Fact Sheet for Patients:  bloggercourse.com  Fact Sheet for Healthcare Providers:  seriousbroker.it  This test is no t yet approved or cleared by the [place]  FDA and  has been authorized for detection and/or diagnosis of SARS-CoV-2 by FDA under an Emergency Use Authorization (EUA). This EUA will remain  in effect (meaning this test can be used) for the duration of the COVID-19 declaration under Section 564(b)(1) of the Act, 21 U.S.C.section 360bbb-3(b)(1), unless the authorization is terminated  or revoked sooner.       Influenza A by PCR NEGATIVE NEGATIVE Final   Influenza B by PCR NEGATIVE NEGATIVE Final    Comment: (NOTE) The Xpert Xpress SARS-CoV-2/FLU/RSV plus assay is intended as an aid in the diagnosis of influenza from Nasopharyngeal swab specimens and should not be used as a sole basis for treatment. Nasal washings and aspirates are unacceptable for Xpert Xpress SARS-CoV-2/FLU/RSV testing.  Fact Sheet for Patients: bloggercourse.com  Fact Sheet for Healthcare Providers: seriousbroker.it  This test is not yet approved or cleared by the [place]  FDA and has been authorized for detection and/or diagnosis of SARS-CoV-2 by FDA under an Emergency Use Authorization (EUA). This EUA will remain in effect (meaning this test can be used) for the duration of the COVID-19 declaration under Section 564(b)(1) of the Act, 21 U.S.C. section 360bbb-3(b)(1), unless the authorization is terminated or revoked.     Resp Syncytial Virus by PCR NEGATIVE NEGATIVE Final    Comment: (NOTE) Fact Sheet for Patients: bloggercourse.com  Fact Sheet for Healthcare Providers: seriousbroker.it  This  test is not yet approved or cleared by the [place]  FDA and has been authorized for detection and/or diagnosis of SARS-CoV-2 by FDA under an Emergency Use Authorization (EUA). This EUA will remain in effect (meaning this test can be used) for the duration of the COVID-19 declaration under Section 564(b)(1) of the Act, 21 U.S.C. section 360bbb-3(b)(1), unless the authorization is terminated or revoked.  Performed at [facility], [address] [place], [place] [address]   Resp panel by RT-PCR (RSV, Flu A&B, Covid) Anterior Nasal Swab  Status: None   Collection Time: 05/06/24  6:16 PM   Specimen: Anterior Nasal Swab  Result Value Ref Range Status   SARS Coronavirus 2 by RT PCR NEGATIVE NEGATIVE Final    Comment: (NOTE) SARS-CoV-2 target nucleic acids are NOT DETECTED.  The SARS-CoV-2 RNA is generally detectable in upper respiratory specimens during the acute phase of infection. The lowest concentration of SARS-CoV-2 viral copies this assay can detect is 138 copies/mL. A negative result does not preclude SARS-Cov-2 infection and should not be used as the sole basis for treatment or other patient management decisions. A negative result may occur with  improper specimen collection/handling, submission of specimen other than nasopharyngeal swab, presence of viral mutation(s) within the areas targeted by this assay, and inadequate number of viral copies(<138 copies/mL). A negative result must be combined with clinical observations, patient history, and epidemiological information. The expected result is Negative.  Fact Sheet for Patients:  bloggercourse.com  Fact Sheet for Healthcare Providers:  seriousbroker.it  This test is no t yet approved or cleared by the [place]  FDA and  has been authorized for detection and/or diagnosis of SARS-CoV-2 by FDA under an Emergency Use Authorization (EUA). This EUA  will remain  in effect (meaning this test can be used) for the duration of the COVID-19 declaration under Section 564(b)(1) of the Act, 21 U.S.C.section 360bbb-3(b)(1), unless the authorization is terminated  or revoked sooner.       Influenza A by PCR NEGATIVE NEGATIVE Final   Influenza B by PCR NEGATIVE NEGATIVE Final    Comment: (NOTE) The Xpert Xpress SARS-CoV-2/FLU/RSV plus assay is intended as an aid in the diagnosis of influenza from Nasopharyngeal swab specimens and should not be used as a sole basis for treatment. Nasal washings and aspirates are unacceptable for Xpert Xpress SARS-CoV-2/FLU/RSV testing.  Fact Sheet for Patients: bloggercourse.com  Fact Sheet for Healthcare Providers: seriousbroker.it  This test is not yet approved or cleared by the [place]  FDA and has been authorized for detection and/or diagnosis of SARS-CoV-2 by FDA under an Emergency Use Authorization (EUA). This EUA will remain in effect (meaning this test can be used) for the duration of the COVID-19 declaration under Section 564(b)(1) of the Act, 21 U.S.C. section 360bbb-3(b)(1), unless the authorization is terminated or revoked.     Resp Syncytial Virus by PCR NEGATIVE NEGATIVE Final    Comment: (NOTE) Fact Sheet for Patients: bloggercourse.com  Fact Sheet for Healthcare Providers: seriousbroker.it  This test is not yet approved or cleared by the [place]  FDA and has been authorized for detection and/or diagnosis of SARS-CoV-2 by FDA under an Emergency Use Authorization (EUA). This EUA will remain in effect (meaning this test can be used) for the duration of the COVID-19 declaration under Section 564(b)(1) of the Act, 21 U.S.C. section 360bbb-3(b)(1), unless the authorization is terminated or revoked.  Performed at [facility], [address], [place], [place] [address]   Blood Culture (routine x 2)     Status: None (Preliminary result)   Collection Time: 05/06/24  6:31 PM   Specimen: BLOOD  Result Value Ref Range Status   Specimen Description   Final    BLOOD LEFT ANTECUBITAL Performed at [facility], [address] [place], [place] [address]    Special Requests   Final    Blood Culture adequate volume BOTTLES DRAWN AEROBIC AND ANAEROBIC Performed at [facility], [address], [place], [place] [address]    Culture   Final  NO GROWTH < 12 HOURS Performed at [facility], [address], [place], [place] [address]    Report Status PENDING  Incomplete  Blood Culture (routine x 2)     Status: None (Preliminary result)   Collection Time: 05/06/24  7:15 PM   Specimen: BLOOD  Result Value Ref Range Status   Specimen Description   Final    BLOOD RIGHT ANTECUBITAL Performed at [facility], [address], [place], [place] [address]    Special Requests   Final    Blood Culture adequate volume BOTTLES DRAWN AEROBIC AND ANAEROBIC Performed at [facility], [address] [place], [place] [address]    Culture   Final    NO GROWTH < 12 HOURS Performed at [facility], [address], [place], [place] [address]    Report Status PENDING  Incomplete  MRSA Next Gen by PCR, Nasal     Status: Abnormal   Collection Time: 05/06/24 10:41 PM   Specimen: Nasal Mucosa; Nasal Swab  Result Value Ref Range Status   MRSA by PCR Next Gen DETECTED (A) NOT DETECTED Final    Comment: RESULTS CALLED TO READ BACK BY AND VERIFED WITH RN M.LAYTON ON 05/07/24 AT 0021 BY NM (NOTE) The GeneXpert MRSA Assay (FDA approved for NASAL specimens only), is one component of a comprehensive MRSA colonization surveillance program. It is not intended to diagnose MRSA infection nor to guide or monitor treatment for MRSA infections. Test performance is not  FDA approved in patients less than 31 years old. Performed at [facility], [address] [place], [place] [address]   Respiratory (~20 pathogens) panel by PCR     Status: None   Collection Time: 05/06/24 11:35 PM   Specimen: Nasopharyngeal Swab; Respiratory  Result Value Ref Range Status   Adenovirus NOT DETECTED NOT DETECTED Final   Coronavirus 229E NOT DETECTED NOT DETECTED Final    Comment: (NOTE) The Coronavirus on the Respiratory Panel, DOES NOT test for the novel  Coronavirus (2019 nCoV)    Coronavirus HKU1 NOT DETECTED NOT DETECTED Final   Coronavirus NL63 NOT DETECTED NOT DETECTED Final   Coronavirus OC43 NOT DETECTED NOT DETECTED Final   Metapneumovirus NOT DETECTED NOT DETECTED Final   Rhinovirus / Enterovirus NOT DETECTED NOT DETECTED Final   Influenza A NOT DETECTED NOT DETECTED Final   Influenza B NOT DETECTED NOT DETECTED Final   Parainfluenza Virus 1 NOT DETECTED NOT DETECTED Final   Parainfluenza Virus 2 NOT DETECTED NOT DETECTED Final   Parainfluenza Virus 3 NOT DETECTED NOT DETECTED Final   Parainfluenza Virus 4 NOT DETECTED NOT DETECTED Final   Respiratory Syncytial Virus NOT DETECTED NOT DETECTED Final   Bordetella pertussis NOT DETECTED NOT DETECTED Final   Bordetella Parapertussis NOT DETECTED NOT DETECTED Final   Chlamydophila pneumoniae NOT DETECTED NOT DETECTED Final   Mycoplasma pneumoniae NOT DETECTED NOT DETECTED Final    Comment: Performed at [facility], [address] [place], [place] [address]         Radiology Studies: CT CHEST WO CONTRAST Result Date: 05/07/2024 EXAM: CT CHEST WITHOUT CONTRAST 05/07/2024 01:32:53 AM TECHNIQUE: CT of the chest was performed without the administration of intravenous contrast. Multiplanar reformatted images are provided for review. Automated exposure control, iterative reconstruction, and/or weight based adjustment of the mA/kV was utilized to reduce the radiation dose to as low as reasonably  achievable. COMPARISON: Chest CT 692 00025. CLINICAL HISTORY: Hx of  COPD, bronchiectasis, cutaneous vasculitis, here w acute respiratory failure, eval for flare, pneumonia. FINDINGS: MEDIASTINUM: Heart is enlarged. There is trace pericardial fluid. There are atherosclerotic calcifications of the aorta and coronary arteries. The central airways are clear. LYMPH NODES: No mediastinal, hilar or axillary lymphadenopathy. LUNGS AND PLEURA: There are trace bilateral pleural effusions. There is some areas of air trapping throughout both lungs. Bronchiectasis, bronchial wall thickening and mucus plugging peripherally are again noted predominantly in the right lower lobe, lingula and right middle lobe. Scattered reticular opacities and scarring in both lung apices along with calcified pleural plaques appear unchanged. There is no new focal lung consolidation. Right lower lobe nodule measures 5 mm on image 5/72, similar to prior. No pneumothorax. SOFT TISSUES/BONES: No acute abnormality of the bones or soft tissues. UPPER ABDOMEN: Limited images of the upper abdomen demonstrates no acute abnormality. IMPRESSION: 1. No new focal lung consolidation. 2. Trace bilateral pleural effusions. 3. 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 If patient is 78 years old and emphysema is present, consider evaluation for a low-dose CT lung cancer screening program. 4. Stable bronchiectasis, bronchial wall thickening, and mucus plugging peripherally, predominantly in the right lower lobe, lingula, and right middle lobe. 5. Right lower lobe solid pulmonary nodule measuring 5 mm, unchanged. For an incidental solid pulmonary nodule 5 mm, no routine follow-up imaging is recommended per Fleischner Society Guidelines (applicable if patient is 78 years old, without cancer history or immunocompromise). Electronically signed by: Greig Pique MD 05/07/2024 01:46 AM EDT RP Workstation:  [id number]   DG Chest Portable 1 View Result Date: 05/06/2024 EXAM: 1 VIEW(S) XRAY OF THE CHEST 05/06/2024 06:50:00 PM COMPARISON: Chest x-ray 04/30/2024. CT of the chest 12/17/2023. CLINICAL HISTORY: Short of breath. Independent living had CNA check on him. Seen last week for dehydration. Returns this week- decreased oral intake-SOB-coughing. Low grade fever. FINDINGS: LUNGS AND PLEURA: Scattered chronic appearing reticular opacities are again seen. No new focal lung infiltrates. No pulmonary edema. No pleural effusion. No pneumothorax. HEART AND MEDIASTINUM: No acute abnormality of the cardiac and mediastinal silhouettes. BONES AND SOFT TISSUES: No acute osseous abnormality. IMPRESSION: 1. No acute process. 2. Scattered chronic appearing reticular opacities, stable compared to prior studies. Electronically signed by: Greig Pique MD 05/06/2024 07:14 PM EDT RP Workstation: [id number]        Scheduled Meds:  apixaban   5 mg Oral BID   budesonide -glycopyrrolate -formoterol   2 puff Inhalation BID   busPIRone   10 mg Oral BID   Chlorhexidine  Gluconate Cloth  6 each Topical Daily   feeding supplement  237 mL Oral BID BM   guaiFENesin   600 mg Oral BID   levalbuterol   0.63 mg Nebulization Q6H   And   ipratropium  0.5 mg Nebulization Q6H   metoprolol  succinate  25 mg Oral BID   mupirocin  ointment  1 Application Nasal BID   pantoprazole   40 mg Oral Daily   sodium chloride   3 mL Nebulization BID   sodium chloride  flush  3 mL Intravenous Q12H   Continuous Infusions:  ceFEPime  (MAXIPIME ) IV 2 g (05/07/24 0825)   magnesium  sulfate bolus IVPB 2 g (05/07/24 1126)   potassium PHOSPHATE  IVPB (in mmol) 30 mmol (05/07/24 1129)   [START ON 05/08/2024] vancomycin        LOS: 1 day    Time spent: 45 minutes spent on chart review, discussion with nursing staff, consultants, updating family and interview/physical exam; more than 50% of that time was spent in  counseling and/or coordination of  care.    Harlene RAYMOND Bowl, DO [facility] Available via Epic secure chat 7am-7pm After these hours, please refer to coverage provider listed on [email or web address] 05/07/2024, 12:19 PM

## 2024-05-07 NOTE — Plan of Care (Signed)
  Problem: Education: Goal: Knowledge of General Education information will improve Description: Including pain rating scale, medication(s)/side effects and non-pharmacologic comfort measures Outcome: Progressing   Problem: Clinical Measurements: Goal: Will remain free from infection Outcome: Progressing Goal: Diagnostic test results will improve Outcome: Progressing Goal: Respiratory complications will improve Outcome: Progressing   Problem: Activity: Goal: Risk for activity intolerance will decrease Outcome: Progressing   Problem: Nutrition: Goal: Adequate nutrition will be maintained Outcome: Progressing   Problem: Coping: Goal: Level of anxiety will decrease Outcome: Progressing   Problem: Safety: Goal: Ability to remain free from injury will improve Outcome: Progressing

## 2024-05-07 NOTE — Consult Note (Signed)
 Pulmonary Consult Note  Date of Service: 05/07/2024  CC: Shortness of breath  HPI: Matthew Hall 78 y.o. 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  Patient describes progressive dyspnea over past few weeks after recent dental extraction.  Patient had seen PCP on 10/24 and was started on azithromycin  for concerns of pneumonia.  Per ED note, patient with subjective fevers, body aches and productive cough.  Patient was admitted to [facility] team on 10/28.  PCCM consulted 10/29 for dyspnea evaluation.  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 (R-levaquin ), 60k colonies >>tx w/ minocycline  for 2 weeks  Past Medical History:  Diagnosis Date   Anxiety    Aortic atherosclerosis    Atrial fibrillation (HCC)    Bladder cancer (HCC)    Chronic systolic heart failure (HCC)    Colon polyps    COPD (chronic obstructive pulmonary disease) (HCC)    Diabetes mellitus with complication (HCC)    Diabetic peripheral neuropathy (HCC) 12/27/2020   Diverticulosis    Essential hypertension    GERD (gastroesophageal reflux disease)    History of CVA with residual deficit    R foot drop   History of pneumonia    Nephrolithiasis    OSA (obstructive sleep apnea)    Osteoarthritis    Pulmonary nodule    surveillance by Dr. 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 ?hx of mononeuritis multiplex   Surg Hx Past Surgical History:  Procedure Laterality Date   BRONCHIAL WASHINGS  06/19/2023   Procedure: BRONCHIAL WASHINGS;  Surgeon: Gretta Leita SQUIBB, DO;  Location: [facility];  Service: Cardiopulmonary;;   COLONOSCOPY     Kidney stones removed  1996   SKIN CANCER EXCISION Left    2003   TRANSTHORACIC ECHOCARDIOGRAM     04/2023 EF 40-45 on echo transcription but reviewed  by Dr. Vernice who felt like EF was 55 to 60% and no impairment of LV function, nl RV fxn, Mod tricusp regurg.   TRANSURETHRAL RESECTION OF BLADDER TUMOR  1996   VIDEO BRONCHOSCOPY N/A 06/19/2023   Procedure: VIDEO BRONCHOSCOPY WITHOUT FLUORO;  Surgeon: Gretta Leita SQUIBB, DO;  Location: [facility];  Service: Cardiopulmonary;  Laterality: N/A;     Social Hx Former smoker, [phone], 30 pack years   Phys Exam General: No acute distress resting comfortably in room. CV: Normal S1/S2. No extra heart sounds. Warm and well-perfused. Pulm: Breathing comfortably on 2.5L O2.  Diffuse crackles throughout.  Minimal intermittent expiratory wheeze.  No increased WOB. Abd: Soft, non-tender, non-distended. Skin:  Warm, dry. Psych: Pleasant and appropriate.    Studies WBC 13.5 > 14.2 Hgb 10.4 > 10.7 Cr 0.8 Pro-Cal <0.1 BNP 549  CT Chest 10/29: Trace bilateral pleural effusions.  No new focal lung consolidation.  Air trapping.  Stable bronchiectasis, bronchial wall thickening, and mucous plugging.  Unchanged RLL pulmonary nodule.  Assessment Bronchiectasis appears stable on CT, no signs of pneumonia. Less likely COPD given symptom history and minimal wheezing on exam. Possible component of ADHF given mildly elevated BNP and subjective improvement with IV diuresis.  Less likely pulmonary nontuberculosis Mycobacterium given prior negative AFB from BAL Dec 2024, additional testing this admission pending.  Plan - Follow AFB testing - Continue COPD triple therapy  - Continue chest PT - Rest per primary team

## 2024-05-07 NOTE — Telephone Encounter (Signed)
 FYI  Please see below

## 2024-05-07 NOTE — Progress Notes (Addendum)
 Pharmacy Antibiotic Note  Matthew Hall is a 78 y.o. male admitted on 05/06/2024 with shortness of breath.  Pharmacy has been consulted for vancomycin  dosing for community acquired pneumonia. PMH includes recent tooth extraction (s/p course of clindamycin), structural lung disease, bronchiectasis, COPD (2L of O2), recently started course of azithromycin  with no improvement.  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 (R-levaquin ), 60k colonies >>tx w/ minocycline  for 2 weeks  Plan: -Cefepime  2g IV every 8 hours -Vancomycin  1500mg  IV x1 -Vancomycin  1250mg  IV every 24 hours (AUC 493, Vd 0.72, TBW, sCr 0.82) -Monitor renal function -Follow up signs of clinical improvement, LOT, de-escalation of antibiotics  Weight: 62 kg (136 lb 11.2 oz)  Temp (24hrs), Avg:99.4 F (37.4 C), Min:98.9 F (37.2 C), Max:99.7 F (37.6 C)  Recent Labs  Lab 04/30/24 2050 05/02/24 1214 05/06/24 1816 05/06/24 1831  WBC 14.5* 10.8* 13.5*  --   CREATININE 0.89 0.63  --  0.82  LATICACIDVEN 1.2  --   --  1.2    Estimated Creatinine Clearance: 63 mL/min (by C-G formula based on SCr of 0.82 mg/dL).    Allergies  Allergen Reactions   Simvastatin Rash   Cortisone Nausea And Vomiting   Albuterol  Palpitations and Other (See Comments)    Pt is okay to take xopenex , albuterol  raises his heart rate   Augmentin [Amoxicillin-Pot Clavulanate] Rash   Ciprofloxacin Rash   Contrast Media [Iodinated Contrast Media] Rash   Flagyl  [Metronidazole ] Rash   Latex Hives and Rash   Sulfa  Antibiotics Rash    Antimicrobials this admission: Cefepime  10/28 >>  Vancomycin  10/29 >>   Microbiology results: 10/28 BCx:  10/28 MRSA PCR: +  Thank you for allowing pharmacy to be a part of this patient's care.  Lynwood Poplar, PharmD, BCPS Clinical Pharmacist 05/07/2024 1:44 AM

## 2024-05-08 ENCOUNTER — Inpatient Hospital Stay (HOSPITAL_COMMUNITY)

## 2024-05-08 ENCOUNTER — Other Ambulatory Visit (HOSPITAL_COMMUNITY): Payer: Self-pay

## 2024-05-08 DIAGNOSIS — I5031 Acute diastolic (congestive) heart failure: Secondary | ICD-10-CM

## 2024-05-08 DIAGNOSIS — E43 Unspecified severe protein-calorie malnutrition: Secondary | ICD-10-CM | POA: Insufficient documentation

## 2024-05-08 DIAGNOSIS — J9621 Acute and chronic respiratory failure with hypoxia: Secondary | ICD-10-CM | POA: Diagnosis not present

## 2024-05-08 LAB — ECHOCARDIOGRAM COMPLETE
AR max vel: 1.65 cm2
AV Area VTI: 1.45 cm2
AV Area mean vel: 1.31 cm2
AV Mean grad: 5 mmHg
AV Peak grad: 8.5 mmHg
Ao pk vel: 1.46 m/s
Area-P 1/2: 5.16 cm2
Calc EF: 53.8 %
Height: 71 in
MV M vel: 2.69 m/s
MV Peak grad: 28.9 mmHg
MV VTI: 1.84 cm2
S' Lateral: 3.4 cm
Single Plane A2C EF: 55.5 %
Single Plane A4C EF: 53.5 %
Weight: 2174.4 [oz_av]

## 2024-05-08 LAB — CBC
HCT: 33 % — ABNORMAL LOW (ref 39.0–52.0)
Hemoglobin: 10.4 g/dL — ABNORMAL LOW (ref 13.0–17.0)
MCH: 28.5 pg (ref 26.0–34.0)
MCHC: 31.5 g/dL (ref 30.0–36.0)
MCV: 90.4 fL (ref 80.0–100.0)
Platelets: 549 K/uL — ABNORMAL HIGH (ref 150–400)
RBC: 3.65 MIL/uL — ABNORMAL LOW (ref 4.22–5.81)
RDW: 14.1 % (ref 11.5–15.5)
WBC: 11.3 K/uL — ABNORMAL HIGH (ref 4.0–10.5)
nRBC: 0 % (ref 0.0–0.2)

## 2024-05-08 LAB — ANTINUCLEAR ANTIBODIES, IFA: ANA Ab, IFA: NEGATIVE

## 2024-05-08 LAB — ANCA PROFILE
Anti-MPO Antibodies: <0.2 U (ref 0.0–0.9)
Anti-PR3 Antibodies: <0.2 U (ref 0.0–0.9)
Atypical P-ANCA titer: <1:20 {titer}
C-ANCA: <1:20 {titer}
P-ANCA: <1:20 {titer}

## 2024-05-08 LAB — C4 COMPLEMENT: Complement C4, Body Fluid: 28 mg/dL (ref 12–38)

## 2024-05-08 LAB — C3 COMPLEMENT: C3 Complement: 163 mg/dL (ref 82–167)

## 2024-05-08 LAB — GLUCOSE, CAPILLARY: Glucose-Capillary: 147 mg/dL — ABNORMAL HIGH (ref 70–99)

## 2024-05-08 MED ORDER — LEVALBUTEROL HCL 0.63 MG/3ML IN NEBU
0.6300 mg | INHALATION_SOLUTION | Freq: Four times a day (QID) | RESPIRATORY_TRACT | Status: DC | PRN
  Administered 2024-05-10 – 2024-05-11 (×2): 0.63 mg via RESPIRATORY_TRACT
  Filled 2024-05-08 (×2): qty 3

## 2024-05-08 MED ORDER — GLUCERNA SHAKE PO LIQD
237.0000 mL | Freq: Two times a day (BID) | ORAL | Status: DC
  Administered 2024-05-09 – 2024-05-17 (×15): 237 mL via ORAL

## 2024-05-08 MED ORDER — ADULT MULTIVITAMIN W/MINERALS CH
1.0000 | ORAL_TABLET | Freq: Every day | ORAL | Status: DC
  Administered 2024-05-08 – 2024-05-17 (×10): 1 via ORAL
  Filled 2024-05-08 (×10): qty 1

## 2024-05-08 MED ORDER — THIAMINE MONONITRATE 100 MG PO TABS
100.0000 mg | ORAL_TABLET | Freq: Every day | ORAL | Status: AC
  Administered 2024-05-08 – 2024-05-16 (×9): 100 mg via ORAL
  Filled 2024-05-08 (×9): qty 1

## 2024-05-08 NOTE — Progress Notes (Signed)
 Mobility Specialist Progress Note;   05/08/24 1048  Mobility  Activity Pivoted/transferred from bed to chair  Level of Assistance Contact guard assist, steadying assist  Assistive Device None  Distance Ambulated (ft) 5 ft  Activity Response Tolerated well  Mobility Referral Yes  Mobility visit 1 Mobility  Mobility Specialist Start Time (ACUTE ONLY) 1048  Mobility Specialist Stop Time (ACUTE ONLY) 1058  Mobility Specialist Time Calculation (min) (ACUTE ONLY) 10 min   Pt agreeable to mobility. Required MinG assistance to transfer from bed to chair w/o AD. Shoes donned for pt. VSS throughout on 2LO2. Pt left comfortably in bed with all needs met, alarm on.   Lauraine Erm Mobility Specialist Please contact via SecureChat or [employer] [phone]

## 2024-05-08 NOTE — Progress Notes (Signed)
 PROGRESS NOTE    Matthew Hall  FMW:[id number] DOB: 12-19-1945 DOA: 05/06/2024 PCP: Candise [name], MD    Brief Narrative:  78 y.o. 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 He has rales bilaterally likely from underlying lung disease, no significant wheezing. On home O2 2L at night, however tacypneic in the high 20s, low 30s, intermittently tachycardic, borderline temp 37.6C. Flu / COVID / RSV is negative. CXR demonstrates reticular opacities which are chronic.  --pulm consult considering complex underlying lung disease  -- CT chest done -- stop abx as per recs from pccm -- Hold off on steroids as does not seem to have much wheezing  -- Received diuresis at OSH with concern for HF, doubt related to HF exacerbation, hold on further diuresis. Suspect BNP up in setting of strain from his underlying respiratory process,. -echo pending -- Check Sputum culture, MRSA nares, RVP, AFB considering prior concern for NTB disease; routine isolation precations OK.  -- ESR, CRP-- elevated ANA, ANCA, C3/4 with his prior hx vasculitis are pending  -- Continue home Breztri . saline neb BID, Duonebs (sub alb for xopenex ) q6 hr, IS, guaifenisen, OOB  to chair during the day  -- wean O2 as able-- only wears at night -lasix  x 2 doses- down 3.3L   Elevated troponin Minimal elevation just above reference limit < 15 -> 21 hs troponin. EKG is limited by artifact but no overt ischemic changes. And he has no component of chest pain. Suspect demand in setting of his acute respiratory process above.  -- Management directed at his respiratory process    Recent dental extraction  -- No signs of complication on exam, well healed.    Deconditioning  -- PT evaluation   Hypokalemia/hypomagnesemia/hypophosphatemia -replete - Await labs on 10/30  Nutrition Status: Nutrition Problem: Severe Malnutrition Etiology: social / environmental circumstances Signs/Symptoms: percent weight loss, severe muscle depletion, moderate fat depletion Percent weight loss: 22 % (in one year) Interventions: Glucerna shake, MVI   Prior discharge: Needs labs, echo, and weaned to room air during the day   DVT prophylaxis:  apixaban  (ELIQUIS ) tablet 5 mg    Code Status: Full Code Family Communication: on phone 10/29  Disposition Plan:  Level of care: Progressive Status is: Inpatient     Consultants:  pulm   Subjective: Tolerating diet, no pain with teeth  Objective: Vitals:   05/08/24 0829 05/08/24 0836 05/08/24 0949 05/08/24 1105  BP:   119/70 (!) 105/57  Pulse:   90 79  Resp:    (!) 23  Temp:    98 F (36.7 C)  TempSrc:    Oral  SpO2: 96% 98%  96%  Weight:      Height:        Intake/Output Summary (Last 24 hours) at 05/08/2024 1208 Last data filed at 05/07/2024 1939 Gross  per 24 hour  Intake 120 ml  Output 1200 ml  Net -1080 ml   Filed Weights   05/06/24 2229 05/07/24 0300 05/08/24 0503  Weight: 62 kg 62 kg 61.6 kg    Examination:   General: Appearance:    Thin male in no acute distress     Lungs:     respirations unlabored, diminished  Heart:    Normal heart rate..    MS:   All extremities are intact.    Neurologic:    Awake, alert       Data Reviewed: I have personally reviewed following labs and imaging studies  CBC: Recent Labs  Lab 05/02/24 1214 05/06/24 1816 05/07/24 0303  WBC 10.8* 13.5* 14.2*  NEUTROABS 8.2*  --   --   HGB 10.9* 10.4* 10.7*  HCT 34.0* 32.4* 33.0*  MCV 88.2 88.8 88.2  PLT 513.0* 510* 564*   Basic Metabolic Panel: Recent Labs  Lab 05/02/24 1214 05/06/24 1831 05/07/24 0303  NA 137 134* 132*  K 3.6 4.3 3.2*  CL 99 96* 90*  CO2 28 28 29   GLUCOSE 120* 100* 159*  BUN 14 14 16   CREATININE 0.63 0.82 0.80  CALCIUM  8.4 9.2 8.9  MG  --   --  1.6*  PHOS  --   --  2.2*   GFR: Estimated Creatinine Clearance: 64.2 mL/min (by C-G formula based on SCr of 0.8 mg/dL). Liver Function Tests: Recent Labs  Lab 05/06/24 1831  AST 27  ALT 23  ALKPHOS 140*  BILITOT 0.6  PROT 7.2  ALBUMIN 3.2*   No results for input(s): LIPASE, AMYLASE in the last 168 hours. No results for input(s): AMMONIA in the last 168 hours. Coagulation Profile: Recent Labs  Lab 05/06/24 1831  INR 1.6*   Cardiac Enzymes: No results for input(s): CKTOTAL, CKMB, CKMBINDEX, TROPONINI in the last 168 hours. BNP (last 3 results) Recent Labs    09/10/23 1513 05/06/24 1831  PROBNP 609* 5,188.0*   HbA1C: No results for input(s): HGBA1C in the last 72 hours. CBG: Recent Labs  Lab 05/06/24 1809 05/06/24 2232 05/08/24 0802  GLUCAP 95 110* 147*   Lipid Profile: No results for input(s): CHOL, HDL, LDLCALC, TRIG, CHOLHDL, LDLDIRECT in the last 72 hours. Thyroid  Function Tests: No results for input(s): TSH, T4TOTAL, FREET4, T3FREE, THYROIDAB in the last 72 hours. Anemia Panel: No results for input(s): VITAMINB12, FOLATE, FERRITIN, TIBC, IRON, RETICCTPCT in the last 72 hours. Sepsis Labs: Recent Labs  Lab 05/06/24 1831 05/07/24 0303  PROCALCITON  --  <0.10  LATICACIDVEN 1.2  --     Recent Results (from the past 240 hours)  Blood Culture  (routine x 2)     Status: None   Collection Time: 04/30/24  8:49 PM   Specimen: BLOOD  Result Value Ref Range Status   Specimen Description   Final    BLOOD RIGHT ANTECUBITAL Performed at [facility], [address] [place], [place] [address]    Special Requests   Final    BOTTLES DRAWN AEROBIC AND ANAEROBIC Blood Culture adequate volume Performed at [facility], [address], [place], [place] [address]    Culture   Final    NO GROWTH 5 DAYS Performed at [facility], [address] [place], [place] [address]    Report Status 05/05/2024 FINAL  Final  Blood Culture (routine x 2)     Status: None   Collection Time: 04/30/24  8:50 PM   Specimen:  BLOOD  Result Value Ref Range Status   Specimen Description   Final    BLOOD LEFT ANTECUBITAL Performed at [facility], [address], [place], [place] [address]    Special Requests   Final    BOTTLES DRAWN AEROBIC AND ANAEROBIC Blood Culture adequate volume Performed at [facility], [address] [place], [place] [address]    Culture   Final    NO GROWTH 5 DAYS Performed at [facility], [address], [place], [place] [address]    Report Status 05/05/2024 FINAL  Final  Resp panel by RT-PCR (RSV, Flu A&B, Covid)     Status: None   Collection Time: 04/30/24  8:50 PM   Specimen: Nasal Swab  Result Value Ref Range Status   SARS Coronavirus 2 by RT PCR NEGATIVE NEGATIVE Final    Comment: (NOTE) SARS-CoV-2 target nucleic acids are NOT DETECTED.  The SARS-CoV-2 RNA is generally detectable in upper respiratory specimens during the acute phase of infection. The lowest concentration of SARS-CoV-2 viral copies this assay can detect is 138 copies/mL. A negative result does not preclude SARS-Cov-2 infection and should not be used as the sole basis for treatment or other patient management decisions. A negative result may occur with   improper specimen collection/handling, submission of specimen other than nasopharyngeal swab, presence of viral mutation(s) within the areas targeted by this assay, and inadequate number of viral copies(<138 copies/mL). A negative result must be combined with clinical observations, patient history, and epidemiological information. The expected result is Negative.  Fact Sheet for Patients:  bloggercourse.com  Fact Sheet for Healthcare Providers:  seriousbroker.it  This test is no t yet approved or cleared by the [place]  FDA and  has been authorized for detection and/or diagnosis of SARS-CoV-2 by FDA under an Emergency Use Authorization (EUA). This EUA will remain  in effect (meaning this test can be used) for the duration of the COVID-19 declaration under Section 564(b)(1) of the Act, 21 U.S.C.section 360bbb-3(b)(1), unless the authorization is terminated  or revoked sooner.       Influenza A by PCR NEGATIVE NEGATIVE Final   Influenza B by PCR NEGATIVE NEGATIVE Final    Comment: (NOTE) The Xpert Xpress SARS-CoV-2/FLU/RSV plus assay is intended as an aid in the diagnosis of influenza from Nasopharyngeal swab specimens and should not be used as a sole basis for treatment. Nasal washings and aspirates are unacceptable for Xpert Xpress SARS-CoV-2/FLU/RSV testing.  Fact Sheet for Patients: bloggercourse.com  Fact Sheet for Healthcare Providers: seriousbroker.it  This test is not yet approved or cleared by the [place]  FDA and has been authorized for detection and/or diagnosis of SARS-CoV-2 by FDA under an Emergency Use Authorization (EUA). This EUA will remain in effect (meaning this test can be used) for the duration of the COVID-19 declaration under Section 564(b)(1) of the Act, 21 U.S.C. section 360bbb-3(b)(1), unless the authorization is terminated or revoked.      Resp Syncytial Virus by PCR NEGATIVE NEGATIVE Final    Comment: (NOTE) Fact Sheet for Patients: bloggercourse.com  Fact Sheet for Healthcare Providers: seriousbroker.it  This test is not yet approved or cleared by the [place]  FDA and has been authorized for detection and/or diagnosis of SARS-CoV-2 by FDA under an Emergency Use Authorization (EUA). This EUA will remain in effect (meaning this test can be used) for the duration of the COVID-19 declaration under Section 564(b)(1) of the Act, 21 U.S.C. section 360bbb-3(b)(1), unless the authorization is terminated  or revoked.  Performed at [facility], [address] [place], [place] [address]   Resp panel by RT-PCR (RSV, Flu A&B, Covid) Anterior Nasal Swab     Status: None   Collection Time: 05/06/24  6:16 PM   Specimen: Anterior Nasal Swab  Result Value Ref Range Status   SARS Coronavirus 2 by RT PCR NEGATIVE NEGATIVE Final    Comment: (NOTE) SARS-CoV-2 target nucleic acids are NOT DETECTED.  The SARS-CoV-2 RNA is generally detectable in upper respiratory specimens during the acute phase of infection. The lowest concentration of SARS-CoV-2 viral copies this assay can detect is 138 copies/mL. A negative result does not preclude SARS-Cov-2 infection and should not be used as the sole basis for treatment or other patient management decisions. A negative result may occur with  improper specimen collection/handling, submission of specimen other than nasopharyngeal swab, presence of viral mutation(s) within the areas targeted by this assay, and inadequate number of viral copies(<138 copies/mL). A negative result must be combined with clinical observations, patient history, and epidemiological information. The expected result is Negative.  Fact Sheet for Patients:  bloggercourse.com  Fact Sheet for Healthcare Providers:   seriousbroker.it  This test is no t yet approved or cleared by the [place]  FDA and  has been authorized for detection and/or diagnosis of SARS-CoV-2 by FDA under an Emergency Use Authorization (EUA). This EUA will remain  in effect (meaning this test can be used) for the duration of the COVID-19 declaration under Section 564(b)(1) of the Act, 21 U.S.C.section 360bbb-3(b)(1), unless the authorization is terminated  or revoked sooner.       Influenza A by PCR NEGATIVE NEGATIVE Final   Influenza B by PCR NEGATIVE NEGATIVE Final    Comment: (NOTE) The Xpert Xpress SARS-CoV-2/FLU/RSV plus assay is intended as an aid in the diagnosis of influenza from Nasopharyngeal swab specimens and should not be used as a sole basis for treatment. Nasal washings and aspirates are unacceptable for Xpert Xpress SARS-CoV-2/FLU/RSV testing.  Fact Sheet for Patients: bloggercourse.com  Fact Sheet for Healthcare Providers: seriousbroker.it  This test is not yet approved or cleared by the [place]  FDA and has been authorized for detection and/or diagnosis of SARS-CoV-2 by FDA under an Emergency Use Authorization (EUA). This EUA will remain in effect (meaning this test can be used) for the duration of the COVID-19 declaration under Section 564(b)(1) of the Act, 21 U.S.C. section 360bbb-3(b)(1), unless the authorization is terminated or revoked.     Resp Syncytial Virus by PCR NEGATIVE NEGATIVE Final    Comment: (NOTE) Fact Sheet for Patients: bloggercourse.com  Fact Sheet for Healthcare Providers: seriousbroker.it  This test is not yet approved or cleared by the [place]  FDA and has been authorized for detection and/or diagnosis of SARS-CoV-2 by FDA under an Emergency Use Authorization (EUA). This EUA will remain in effect (meaning this test can be used) for  the duration of the COVID-19 declaration under Section 564(b)(1) of the Act, 21 U.S.C. section 360bbb-3(b)(1), unless the authorization is terminated or revoked.  Performed at [facility], [address] [place], [place] [address]   Blood Culture (routine x 2)     Status: None (Preliminary result)   Collection Time: 05/06/24  6:31 PM   Specimen: BLOOD  Result Value Ref Range Status   Specimen Description   Final    BLOOD LEFT ANTECUBITAL Performed at [facility], [address], [place], [place] [address]    Special Requests  Final    Blood Culture adequate volume BOTTLES DRAWN AEROBIC AND ANAEROBIC Performed at [facility], [address], [place], [place] [address]    Culture   Final    NO GROWTH 2 DAYS Performed at [facility], [address], [place], [place] [address]    Report Status PENDING  Incomplete  Blood Culture (routine x 2)     Status: None (Preliminary result)   Collection Time: 05/06/24  7:15 PM   Specimen: BLOOD  Result Value Ref Range Status   Specimen Description   Final    BLOOD RIGHT ANTECUBITAL Performed at [facility], [address] [place], [place] [address]    Special Requests   Final    Blood Culture adequate volume BOTTLES DRAWN AEROBIC AND ANAEROBIC Performed at [facility], [address] [place], [place] [address]    Culture   Final    NO GROWTH 2 DAYS Performed at [facility], [address], [place], [place] [address]    Report Status PENDING  Incomplete  MRSA Next Gen by PCR, Nasal     Status: Abnormal   Collection Time: 05/06/24 10:41 PM   Specimen: Nasal Mucosa; Nasal Swab  Result Value Ref Range Status   MRSA by PCR Next Gen DETECTED (A) NOT DETECTED Final    Comment: RESULTS CALLED TO READ BACK BY AND VERIFED WITH RN M.LAYTON ON 05/07/24 AT 0021 BY NM (NOTE) The GeneXpert MRSA Assay (FDA approved for NASAL  specimens only), is one component of a comprehensive MRSA colonization surveillance program. It is not intended to diagnose MRSA infection nor to guide or monitor treatment for MRSA infections. Test performance is not FDA approved in patients less than 67 years old. Performed at [facility], [address] [place], [place] [address]   Respiratory (~20 pathogens) panel by PCR     Status: None   Collection Time: 05/06/24 11:35 PM   Specimen: Nasopharyngeal Swab; Respiratory  Result Value Ref Range Status   Adenovirus NOT DETECTED NOT DETECTED Final   Coronavirus 229E NOT DETECTED NOT DETECTED Final    Comment: (NOTE) The Coronavirus on the Respiratory Panel, DOES NOT test for the novel  Coronavirus (2019 nCoV)    Coronavirus HKU1 NOT DETECTED NOT DETECTED Final   Coronavirus NL63 NOT DETECTED NOT DETECTED Final   Coronavirus OC43 NOT DETECTED NOT DETECTED Final   Metapneumovirus NOT DETECTED NOT DETECTED Final   Rhinovirus / Enterovirus NOT DETECTED NOT DETECTED Final   Influenza A NOT DETECTED NOT DETECTED Final   Influenza B NOT DETECTED NOT DETECTED Final   Parainfluenza Virus 1 NOT DETECTED NOT DETECTED Final   Parainfluenza Virus 2 NOT DETECTED NOT DETECTED Final   Parainfluenza Virus 3 NOT DETECTED NOT DETECTED Final   Parainfluenza Virus 4 NOT DETECTED NOT DETECTED Final   Respiratory Syncytial Virus NOT DETECTED NOT DETECTED Final   Bordetella pertussis NOT DETECTED NOT DETECTED Final   Bordetella Parapertussis NOT DETECTED NOT DETECTED Final   Chlamydophila pneumoniae NOT DETECTED NOT DETECTED Final   Mycoplasma pneumoniae NOT DETECTED NOT DETECTED Final    Comment: Performed at [facility], [address] [place], [place] [address]         Radiology Studies: CT CHEST WO CONTRAST Result Date: 05/07/2024 EXAM: CT CHEST WITHOUT CONTRAST 05/07/2024 01:32:53 AM TECHNIQUE: CT of the chest was performed without the administration of intravenous contrast.  Multiplanar reformatted images are provided for review. Automated  exposure control, iterative reconstruction, and/or weight based adjustment of the mA/kV was utilized to reduce the radiation dose to as low as reasonably achievable. COMPARISON: Chest CT 692 00025. CLINICAL HISTORY: Hx of COPD, bronchiectasis, cutaneous vasculitis, here w acute respiratory failure, eval for flare, pneumonia. FINDINGS: MEDIASTINUM: Heart is enlarged. There is trace pericardial fluid. There are atherosclerotic calcifications of the aorta and coronary arteries. The central airways are clear. LYMPH NODES: No mediastinal, hilar or axillary lymphadenopathy. LUNGS AND PLEURA: There are trace bilateral pleural effusions. There is some areas of air trapping throughout both lungs. Bronchiectasis, bronchial wall thickening and mucus plugging peripherally are again noted predominantly in the right lower lobe, lingula and right middle lobe. Scattered reticular opacities and scarring in both lung apices along with calcified pleural plaques appear unchanged. There is no new focal lung consolidation. Right lower lobe nodule measures 5 mm on image 5/72, similar to prior. No pneumothorax. SOFT TISSUES/BONES: No acute abnormality of the bones or soft tissues. UPPER ABDOMEN: Limited images of the upper abdomen demonstrates no acute abnormality. IMPRESSION: 1. No new focal lung consolidation. 2. Trace bilateral pleural effusions. 3. 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 If patient is 78 years old and emphysema is present, consider evaluation for a low-dose CT lung cancer screening program. 4. Stable bronchiectasis, bronchial wall thickening, and mucus plugging peripherally, predominantly in the right lower lobe, lingula, and right middle lobe. 5. Right lower lobe solid pulmonary nodule measuring 5 mm, unchanged. For an incidental solid pulmonary nodule 5 mm, no routine follow-up imaging is  recommended per Fleischner Society Guidelines (applicable if patient is 79 years old, without cancer history or immunocompromise). Electronically signed by: Greig Pique MD 05/07/2024 01:46 AM EDT RP Workstation: [id number]   DG Chest Portable 1 View Result Date: 05/06/2024 EXAM: 1 VIEW(S) XRAY OF THE CHEST 05/06/2024 06:50:00 PM COMPARISON: Chest x-ray 04/30/2024. CT of the chest 12/17/2023. CLINICAL HISTORY: Short of breath. Independent living had CNA check on him. Seen last week for dehydration. Returns this week- decreased oral intake-SOB-coughing. Low grade fever. FINDINGS: LUNGS AND PLEURA: Scattered chronic appearing reticular opacities are again seen. No new focal lung infiltrates. No pulmonary edema. No pleural effusion. No pneumothorax. HEART AND MEDIASTINUM: No acute abnormality of the cardiac and mediastinal silhouettes. BONES AND SOFT TISSUES: No acute osseous abnormality. IMPRESSION: 1. No acute process. 2. Scattered chronic appearing reticular opacities, stable compared to prior studies. Electronically signed by: Greig Pique MD 05/06/2024 07:14 PM EDT RP Workstation: [id number]        Scheduled Meds:  apixaban   5 mg Oral BID   budesonide -glycopyrrolate -formoterol   2 puff Inhalation BID   busPIRone   10 mg Oral BID   Chlorhexidine  Gluconate Cloth  6 each Topical Daily   feeding supplement (GLUCERNA SHAKE)  237 mL Oral BID BM   guaiFENesin   600 mg Oral BID   metoprolol  succinate  25 mg Oral BID   multivitamin with minerals  1 tablet Oral Daily   mupirocin  ointment  1 Application Nasal BID   pantoprazole   40 mg Oral Daily   sodium chloride  flush  3 mL Intravenous Q12H   thiamine  100 mg Oral Daily   Continuous Infusions:     LOS: 2 days    Time spent: 45 minutes spent on chart review, discussion with nursing staff, consultants, updating family and interview/physical exam; more than 50% of that time was spent in counseling and/or coordination of care.    Jull Harral U  Sereniti Wan,  DO [facility] Available via Epic secure chat 7am-7pm After these hours, please refer to coverage provider listed on [email or web address] 05/08/2024, 12:08 PM

## 2024-05-08 NOTE — Progress Notes (Addendum)
 Initial Nutrition Assessment  DOCUMENTATION CODES:   Severe malnutrition in context of social or environmental circumstances  INTERVENTION:  The patient is at risk for refeeding syndrome. Add Thiamine 100 mg PO daily for 7 days. Add Multivitamin PO daily. Monitor magnesium , potassium, and phosphorus daily for at least 3 days, with replacement per protocol. Labs ordered. Liberalize diet to regular diet from 2-g Na diet to encourage PO intake. Change E+HP to Glucerna BID. Each supplement provides 220 kcal and 10 grams of protein.   NUTRITION DIAGNOSIS:   Severe Malnutrition related to social / environmental circumstances as evidenced by percent weight loss, severe muscle depletion, moderate fat depletion.   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 Presented with SOB and cough s/p 5-tooth extraction 3-4 weeks and and found to have bronchiectasis exacerbation, and deconditioning.  Visited the patient with RN at bedside. The patient states his UBW is 174 lbs which he weighed a year ago. He lives in an independent living facility with his wife and likes the food that is provided there. He typically has eggs with with oatmeal and fruit for breakfast, chicken/fish with soup and a fruit bowl for lunch and dinner. He drinks about one Ensure daily. He was eating well up until he had multiple teeth extracted 3 weeks ago. His appetite is improving and he ate 100% of his breakfast today. He wishes he did not have a restricted diet. He tells me his dentition does not affect his PO intake. He is amenable to switching Ensure to Glucerna and  will aim to drink two a day. He uses a walker to get around at home and is typically active.  Scheduled Meds:  apixaban   5 mg Oral BID   budesonide -glycopyrrolate -formoterol   2 puff Inhalation BID   busPIRone   10 mg Oral BID   Chlorhexidine  Gluconate Cloth  6 each Topical Daily   feeding supplement  237 mL Oral BID BM   guaiFENesin   600 mg Oral BID   metoprolol  succinate  25 mg Oral BID   mupirocin  ointment  1 Application Nasal BID   pantoprazole   40 mg Oral Daily   sodium chloride  flush  3 mL Intravenous Q12H   Labs:     Latest Ref Rng & Units 05/07/2024    3:03 AM 05/06/2024    6:31 PM 05/02/2024   12:14 PM  CMP  Glucose 70 - 99 mg/dL 840  899  879   BUN 8 - 23 mg/dL 16  14  14    Creatinine 0.61 - 1.24 mg/dL 9.19  9.17  9.36   Sodium 135 - 145 mmol/L 132  134  137   Potassium 3.5 - 5.1 mmol/L 3.2  4.3  3.6   Chloride 98 - 111 mmol/L 90  96  99   CO2 22 - 32 mmol/L 29  28  28    Calcium  8.9 - 10.3 mg/dL 8.9  9.2  8.4   Total Protein 6.5 - 8.1 g/dL  7.2    Total Bilirubin 0.0 - 1.2 mg/dL  0.6    Alkaline Phos 38 - 126 U/L  140    AST 15 -  41 U/L  27    ALT 0 - 44 U/L  23        I/O: -3.5 L since admit  Meal Intake: 50%   NUTRITION - FOCUSED PHYSICAL EXAM:  Flowsheet Row Most Recent Value  Orbital Region Mild depletion  Upper Arm Region Moderate depletion  Thoracic and Lumbar Region Moderate depletion  Buccal Region Severe depletion  Temple Region Severe depletion  Clavicle Bone Region Severe depletion  Clavicle and Acromion Bone Region Severe depletion  Scapular Bone Region Severe depletion  Dorsal Hand Severe depletion  Patellar Region Severe depletion  Anterior Thigh Region Severe depletion  Posterior Calf Region Severe depletion  Edema (RD Assessment) None  Hair Reviewed  Eyes Reviewed  Mouth Reviewed  Skin Reviewed  Nails Reviewed    Diet Order             Diet 2 gram sodium Room service appropriate? Yes; Fluid consistency: Thin  Diet effective  now                 E+HP BID  EDUCATION NEEDS:   Education needs have been addressed  Skin:  Skin Assessment: Reviewed RN Assessment  Last BM:  10/28 type 4  Height:   Ht Readings from Last 1 Encounters:  05/06/24 5' 11 (1.803 m)    Weight:   3 Kg (4%) loss in 1 month, 5% loss in 3 months 38 lbs (22%) weight loss in 1 year  Ideal Body Weight:  78 kg  BMI:  Body mass index is 18.95 kg/m.  Estimated Nutritional Needs:   Kcal:  1900-2200  Protein:  110-130  Fluid:  1900-2200    Matthew Ruth, MS, RDN, LDN [place]. [facility] See AMION for contact information

## 2024-05-09 ENCOUNTER — Other Ambulatory Visit (HOSPITAL_BASED_OUTPATIENT_CLINIC_OR_DEPARTMENT_OTHER): Payer: Self-pay

## 2024-05-09 ENCOUNTER — Other Ambulatory Visit (HOSPITAL_COMMUNITY): Payer: Self-pay

## 2024-05-09 DIAGNOSIS — J9621 Acute and chronic respiratory failure with hypoxia: Secondary | ICD-10-CM | POA: Diagnosis not present

## 2024-05-09 LAB — CBC
HCT: 34 % — ABNORMAL LOW (ref 39.0–52.0)
Hemoglobin: 10.7 g/dL — ABNORMAL LOW (ref 13.0–17.0)
MCH: 28.3 pg (ref 26.0–34.0)
MCHC: 31.5 g/dL (ref 30.0–36.0)
MCV: 89.9 fL (ref 80.0–100.0)
Platelets: 625 K/uL — ABNORMAL HIGH (ref 150–400)
RBC: 3.78 MIL/uL — ABNORMAL LOW (ref 4.22–5.81)
RDW: 14 % (ref 11.5–15.5)
WBC: 12.4 K/uL — ABNORMAL HIGH (ref 4.0–10.5)
nRBC: 0 % (ref 0.0–0.2)

## 2024-05-09 LAB — IRON AND TIBC
Iron: 18 ug/dL — ABNORMAL LOW (ref 45–182)
Saturation Ratios: 7 % — ABNORMAL LOW (ref 17.9–39.5)
TIBC: 262 ug/dL (ref 250–450)
UIBC: 244 ug/dL

## 2024-05-09 LAB — FERRITIN: Ferritin: 130 ng/mL (ref 24–336)

## 2024-05-09 LAB — BASIC METABOLIC PANEL WITH GFR
Anion gap: 12 (ref 5–15)
BUN: 16 mg/dL (ref 8–23)
CO2: 27 mmol/L (ref 22–32)
Calcium: 8.4 mg/dL — ABNORMAL LOW (ref 8.9–10.3)
Chloride: 96 mmol/L — ABNORMAL LOW (ref 98–111)
Creatinine, Ser: 0.84 mg/dL (ref 0.61–1.24)
GFR, Estimated: >60 mL/min (ref >60)
Glucose, Bld: 135 mg/dL — ABNORMAL HIGH (ref 70–99)
Potassium: 3.5 mmol/L (ref 3.5–5.1)
Sodium: 135 mmol/L (ref 135–145)

## 2024-05-09 LAB — MAGNESIUM: Magnesium: 2 mg/dL (ref 1.7–2.4)

## 2024-05-09 LAB — PHOSPHORUS: Phosphorus: 2.8 mg/dL (ref 2.5–4.6)

## 2024-05-09 LAB — GLUCOSE, CAPILLARY: Glucose-Capillary: 115 mg/dL — ABNORMAL HIGH (ref 70–99)

## 2024-05-09 MED ORDER — FUROSEMIDE 20 MG PO TABS: 20.0000 mg | ORAL_TABLET | Freq: Every day | ORAL | Status: DC | PRN

## 2024-05-09 MED ORDER — FUROSEMIDE 20 MG PO TABS
20.0000 mg | ORAL_TABLET | Freq: Every day | ORAL | 0 refills | Status: DC | PRN
  Filled 2024-05-09: qty 30, 30d supply, fill #0

## 2024-05-09 MED ORDER — TRIAMCINOLONE ACETONIDE 55 MCG/ACT NA AERO
1.0000 | INHALATION_SPRAY | Freq: Every day | NASAL | Status: DC
  Administered 2024-05-09 – 2024-05-17 (×7): 1 via NASAL
  Filled 2024-05-09: qty 10.8

## 2024-05-09 NOTE — Progress Notes (Addendum)
 Called [phone], Silvano at daughter's request (only daughter listed under demographics).  She apparently is a private pay home care agency who is helping with patient's wife.  She has concerns about patient going home as patient's daughter is out of town on a cruise.  Updated her that patient was seen by physical therapy and walked greater than 300 feet on room air.  She also had concerns about patient's white blood cell count being 11.3 on 10/30.  Will recheck today to ensure a return to normal for family's comfort.  Patient has been afebrile Eliazar states that patient's normal temperature is 96).  Unfortunately, phone was disconnected and I was unable to finish my conversation with Silvano to find out when daughter plans to return from her cruise.  Patient has been seen by his pulmonologist (Dr. Jude) he is in agreement that patient appeared to be volume overloaded.  Patient have responded well to IV Lasix  with close to 4 L of diuresis--Will use as needed Lasix  going forward.  Electrolytes have been replaced.  Patient has been weaned back to room air.  I was also informed by the nurse that family plans to appeal discharge.  Discussed with Kristie, TOC. Harlene Bowl DO  Back Holly at her request but no answer so left message Updated by [facility] that daughter returns on Sunday from her vacation.

## 2024-05-09 NOTE — Care Management Important Message (Signed)
 Important Message  Patient Details  Name: Matthew Hall MRN: [id number] Date of Birth: June 13, 1946   Important Message Given:  Yes - Medicare IM     Claretta Deed 05/09/2024, 1:37 PM

## 2024-05-09 NOTE — Discharge Summary (Addendum)
 Physician Discharge Summary  YAMIN SWINGLER FMW:[id number] DOB: 1946-01-05 DOA: 05/06/2024  PCP: Candise [name], MD  Admit date: 05/06/2024 Discharge date: 05/09/2024  Admitted From: [facility]F Discharge disposition: ILF   Recommendations for Outpatient Follow-Up:   CBC/BMP at next visit New medication: Lasix  as needed Patient will need to follow-up with pulmonology as well as cardiology      Discharge Condition: Improved.  Diet recommendation: Low sodium, heart healthy  Wound care: None.  Code status: Full.   History of Present Illness:   Matthew Hall is a 78 y.o. 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       Recent hx of teeth extraction, treated with course of clindamycin. And had recent onset of fevers, PCP concerned about possible pneumonia and Rx'd for azithromycin  10/24. Per outside ED mainly c/o shortness of breath in addition to productive cough, fever, body aches, and congestion. On my interview mostly c/o generalized weakness since his dental procedure. He did not endorse much issues with SOB / Cough, although he was coughing during interview and appears to have some tachypnea. Denies any new rash. Not on steroids PTA. No improvement with Azithromycin  as outpatient    Hospital Course by Problem:   Suspected acute exacerbation of diastolic heart failure Chronic hypoxic respiratory failure - 2L at night --pulm consulted considering complex underlying lung disease -Not convinced that this is a COPD exacerbation, certainly not bronchiectasis  exacerbation and he does not have CAP so doubt need for antibiotics - He has improved with diuresis, has mildly elevated BNP previous echo showed EF 40 to 45% and small effusions noted on imaging, decompensated heart failure possible -- CT chest done-no new lung consolidations, trace pleural effusions -- stop abx as per recs from pccm as procalcitonin negative -- Hold off on steroids as no wheezing  -- Received diuresis at OSH with concern for HF-have given Lasix  x 2 in total with close to 4 L of diuresis and improvement in breathing-Will use as needed Lasix  for now -Weights not accurate as they are not standing.  -echo: EF 50 to 55% -- ESR, CRP-- elevated ANA, ANCA, C3/4 with his prior hx vasculitis are negative -- Continue home Breztri . saline neb BID, Duonebs (sub alb for xopenex ) q6 hr, IS, guaifenisen, OOB to chair during the day  -- wean O2 to off during the day-- only wears at night    Elevated troponin Minimal elevation just above reference limit < 15 -> 21 hs troponin. EKG is limited by artifact but no overt ischemic changes. And he has no component of chest pain. Suspect demand in setting of his acute respiratory process above.  -- Outpatient follow-up with cardiology   Recent dental extraction  -- No signs of complication on exam, well healed.  -Patient denies any mouth pain or discomfort -Eating well-80% of meal was documented as eaten on 10/31   Deconditioning  -- PT evaluation -walked 300 feet on room air   Hypokalemia/hypomagnesemia/hypophosphatemia -repleted   Anemia -suspect related to poor po intake/blood loss from recent dental extractions -hgb stable -PCP follow up -while here will add on  iron studies and plts also increased  Nutrition Status: Nutrition Problem: Severe Malnutrition Etiology: social / environmental circumstances Signs/Symptoms: percent weight loss, severe muscle depletion, moderate fat depletion Percent weight loss: 22 % (in one  year) Interventions: Glucerna shake, MVI    Medical Consultants:   Pulmonary-signed off (see recommendations above)   Discharge Exam:   Vitals:   05/09/24 0815 05/09/24 1126  BP:  133/73  Pulse:  86  Resp:  20  Temp:  98.2 F (36.8 C)  SpO2: 99% 95%   Vitals:   05/09/24 0320 05/09/24 0740 05/09/24 0815 05/09/24 1126  BP: 115/63 129/67  133/73  Pulse: 78 85  86  Resp: (!) 22 20  20   Temp: 98 F (36.7 C) 98.3 F (36.8 C)  98.2 F (36.8 C)  TempSrc: Oral Oral  Oral  SpO2: 95% 100% 99% 95%  Weight: 62.8 kg     Height:        General exam: Appears calm and comfortable.  Walked 300 feet with physical therapy on room air   The results of significant diagnostics from this hospitalization (including imaging, microbiology, ancillary and laboratory) are listed below for reference.     Procedures and Diagnostic Studies:   CT CHEST WO CONTRAST Result Date: 05/07/2024 EXAM: CT CHEST WITHOUT CONTRAST 05/07/2024 01:32:53 AM TECHNIQUE: CT of the chest was performed without the administration of intravenous contrast. Multiplanar reformatted images are provided for review. Automated exposure control, iterative reconstruction, and/or weight based adjustment of the mA/kV was utilized to reduce the radiation dose to as low as reasonably achievable. COMPARISON: Chest CT 692 [id number]. CLINICAL HISTORY: Hx of COPD, bronchiectasis, cutaneous vasculitis, here w acute respiratory failure, eval for flare, pneumonia. FINDINGS: MEDIASTINUM: Heart is enlarged. There is trace pericardial fluid. There are atherosclerotic calcifications of the aorta and coronary arteries. The central airways are clear. LYMPH NODES: No mediastinal, hilar or axillary lymphadenopathy. LUNGS AND PLEURA: There are trace bilateral pleural effusions. There is some areas of air trapping throughout both lungs. Bronchiectasis, bronchial wall thickening and mucus plugging peripherally are again noted predominantly in the right lower  lobe, lingula and right middle lobe. Scattered reticular opacities and scarring in both lung apices along with calcified pleural plaques appear unchanged. There is no new focal lung consolidation. Right lower lobe nodule measures 5 mm on image 5/72, similar to prior. No pneumothorax. SOFT TISSUES/BONES: No acute abnormality of the bones or soft tissues. UPPER ABDOMEN: Limited images of the upper abdomen demonstrates no acute abnormality. IMPRESSION: 1. No new focal lung consolidation. 2. Trace bilateral pleural effusions. 3. 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 If patient is 78 years old and emphysema is present, consider evaluation for a low-dose CT lung cancer screening program. 4. Stable bronchiectasis, bronchial wall thickening, and mucus plugging peripherally, predominantly in the right lower lobe, lingula, and right middle lobe. 5. Right lower lobe solid pulmonary nodule measuring 5 mm, unchanged. For an incidental solid pulmonary nodule 5 mm, no routine follow-up imaging is recommended per Fleischner Society Guidelines (applicable if patient is 78 years old, without cancer history or immunocompromise). Electronically signed by: Greig Pique MD 05/07/2024 01:46 AM EDT RP Workstation: [id number]   DG Chest Portable 1 View Result Date: 05/06/2024 EXAM: 1 VIEW(S) XRAY OF THE CHEST 05/06/2024 06:50:00 PM COMPARISON: Chest x-ray 04/30/2024. CT of the chest 12/17/2023. CLINICAL HISTORY: Short of breath. Independent living had CNA check on him. Seen last week for dehydration. Returns this week- decreased oral  intake-SOB-coughing. Low grade fever. FINDINGS: LUNGS AND PLEURA: Scattered chronic appearing reticular opacities are again seen. No new focal lung infiltrates. No pulmonary edema. No pleural effusion. No pneumothorax. HEART AND MEDIASTINUM: No acute abnormality of the cardiac and mediastinal silhouettes. BONES AND SOFT TISSUES: No acute  osseous abnormality. IMPRESSION: 1. No acute process. 2. Scattered chronic appearing reticular opacities, stable compared to prior studies. Electronically signed by: Greig Pique MD 05/06/2024 07:14 PM EDT RP Workstation: [id number]     Labs:   Basic Metabolic Panel: Recent Labs  Lab 05/06/24 1831 05/07/24 0303 05/09/24 0235  NA 134* 132* 135  K 4.3 3.2* 3.5  CL 96* 90* 96*  CO2 28 29 27   GLUCOSE 100* 159* 135*  BUN 14 16 16   CREATININE 0.82 0.80 0.84  CALCIUM  9.2 8.9 8.4*  MG  --  1.6* 2.0  PHOS  --  2.2* 2.8   GFR Estimated Creatinine Clearance: 62.3 mL/min (by C-G formula based on SCr of 0.84 mg/dL). Liver Function Tests: Recent Labs  Lab 05/06/24 1831  AST 27  ALT 23  ALKPHOS 140*  BILITOT 0.6  PROT 7.2  ALBUMIN 3.2*   No results for input(s): LIPASE, AMYLASE in the last 168 hours. No results for input(s): AMMONIA in the last 168 hours. Coagulation profile Recent Labs  Lab 05/06/24 1831  INR 1.6*    CBC: Recent Labs  Lab 05/06/24 1816 05/07/24 0303 05/08/24 1143  WBC 13.5* 14.2* 11.3*  HGB 10.4* 10.7* 10.4*  HCT 32.4* 33.0* 33.0*  MCV 88.8 88.2 90.4  PLT 510* 564* 549*   Cardiac Enzymes: No results for input(s): CKTOTAL, CKMB, CKMBINDEX, TROPONINI in the last 168 hours. BNP: Invalid input(s): POCBNP CBG: Recent Labs  Lab 05/06/24 1809 05/06/24 2232 05/08/24 0802 05/09/24 0521  GLUCAP 95 110* 147* 115*   D-Dimer No results for input(s): DDIMER in the last 72 hours. Hgb A1c No results for input(s): HGBA1C in the last 72 hours. Lipid Profile No results for input(s): CHOL, HDL, LDLCALC, TRIG, CHOLHDL, LDLDIRECT in the last 72 hours. Thyroid  function studies No results for input(s): TSH, T4TOTAL, T3FREE, THYROIDAB in the last 72 hours.  Invalid input(s): FREET3 Anemia work up No results for input(s): VITAMINB12, FOLATE, FERRITIN, TIBC, IRON, RETICCTPCT in the last 72  hours. Microbiology Recent Results (from the past 240 hours)  Blood Culture (routine x 2)     Status: None   Collection Time: 04/30/24  8:49 PM   Specimen: BLOOD  Result Value Ref Range Status   Specimen Description   Final    BLOOD RIGHT ANTECUBITAL Performed at [facility], [address], [place], [place] [address]    Special Requests   Final    BOTTLES DRAWN AEROBIC AND ANAEROBIC Blood Culture adequate volume Performed at [facility], [address] [place], [place] [address]    Culture   Final    NO GROWTH 5 DAYS Performed at [facility], [address] [place], [place] [address]    Report Status 05/05/2024 FINAL  Final  Blood Culture (routine x 2)     Status: None   Collection Time: 04/30/24  8:50 PM   Specimen: BLOOD  Result Value Ref Range Status   Specimen Description   Final    BLOOD LEFT ANTECUBITAL Performed at [facility], [address] [place], [place] [address]    Special Requests   Final    BOTTLES DRAWN AEROBIC AND ANAEROBIC Blood Culture adequate volume Performed at [facility], [address] [place], [place] [address]    Culture   Final    NO GROWTH 5 DAYS Performed at [facility], [address] [place], [place] [address]    Report Status 05/05/2024 FINAL  Final  Resp panel by RT-PCR (RSV, Flu A&B, Covid)     Status: None   Collection Time: 04/30/24  8:50 PM   Specimen: Nasal Swab  Result Value Ref Range Status   SARS Coronavirus 2 by RT PCR NEGATIVE NEGATIVE Final    Comment: (NOTE) SARS-CoV-2 target nucleic acids are NOT DETECTED.  The SARS-CoV-2 RNA is generally detectable in upper respiratory specimens during the acute phase of infection. The lowest concentration of SARS-CoV-2 viral copies this assay can detect is 138 copies/mL. A negative result does not preclude SARS-Cov-2 infection and should not be used as the sole basis for treatment  or other patient management decisions. A negative result may occur with  improper specimen collection/handling, submission of specimen other than nasopharyngeal swab, presence of viral mutation(s) within the areas targeted by this assay, and inadequate number of viral copies(<138 copies/mL). A negative result must be combined with clinical observations, patient history, and epidemiological information. The expected result is Negative.  Fact Sheet for Patients:  bloggercourse.com  Fact Sheet for Healthcare Providers:  seriousbroker.it  This test is no t yet approved or cleared by the [place]  FDA and  has been authorized for detection and/or diagnosis of SARS-CoV-2 by FDA under an Emergency Use Authorization (EUA). This EUA will remain  in effect (meaning this test can be used) for the duration of the COVID-19 declaration under Section 564(b)(1) of the Act, 21 U.S.C.section 360bbb-3(b)(1), unless the authorization is terminated  or revoked sooner.       Influenza A by PCR NEGATIVE NEGATIVE Final   Influenza B by PCR NEGATIVE NEGATIVE Final    Comment: (NOTE) The Xpert Xpress SARS-CoV-2/FLU/RSV plus assay is intended as an aid in the diagnosis of influenza from Nasopharyngeal swab specimens and should not be used as a sole basis for treatment. Nasal washings and aspirates are unacceptable for Xpert Xpress SARS-CoV-2/FLU/RSV testing.  Fact Sheet for Patients: bloggercourse.com  Fact Sheet for Healthcare Providers: seriousbroker.it  This test is not yet approved or cleared by the [place]  FDA and has been authorized for detection and/or diagnosis of SARS-CoV-2 by FDA under an Emergency Use Authorization (EUA). This EUA will remain in effect (meaning this test can be used) for the duration of the COVID-19 declaration under Section 564(b)(1) of the Act, 21 U.S.C. section  360bbb-3(b)(1), unless the authorization is terminated or revoked.     Resp Syncytial Virus by PCR NEGATIVE NEGATIVE Final    Comment: (NOTE) Fact Sheet for Patients: bloggercourse.com  Fact Sheet for Healthcare Providers: seriousbroker.it  This test is not yet approved or cleared by the [place]  FDA and has been authorized for detection and/or diagnosis of SARS-CoV-2 by FDA under an Emergency Use Authorization (EUA). This EUA will remain in effect (meaning this test can be used) for the duration of the COVID-19 declaration under Section 564(b)(1) of the Act, 21 U.S.C. section 360bbb-3(b)(1), unless the authorization is terminated or revoked.  Performed at [facility], [address] [place], [place] [address]   Resp panel by RT-PCR (RSV, Flu A&B, Covid) Anterior Nasal Swab     Status: None   Collection Time: 05/06/24  6:16 PM   Specimen: Anterior Nasal Swab  Result Value Ref Range Status  SARS Coronavirus 2 by RT PCR NEGATIVE NEGATIVE Final    Comment: (NOTE) SARS-CoV-2 target nucleic acids are NOT DETECTED.  The SARS-CoV-2 RNA is generally detectable in upper respiratory specimens during the acute phase of infection. The lowest concentration of SARS-CoV-2 viral copies this assay can detect is 138 copies/mL. A negative result does not preclude SARS-Cov-2 infection and should not be used as the sole basis for treatment or other patient management decisions. A negative result may occur with  improper specimen collection/handling, submission of specimen other than nasopharyngeal swab, presence of viral mutation(s) within the areas targeted by this assay, and inadequate number of viral copies(<138 copies/mL). A negative result must be combined with clinical observations, patient history, and epidemiological information. The expected result is Negative.  Fact Sheet for Patients:   bloggercourse.com  Fact Sheet for Healthcare Providers:  seriousbroker.it  This test is no t yet approved or cleared by the [place]  FDA and  has been authorized for detection and/or diagnosis of SARS-CoV-2 by FDA under an Emergency Use Authorization (EUA). This EUA will remain  in effect (meaning this test can be used) for the duration of the COVID-19 declaration under Section 564(b)(1) of the Act, 21 U.S.C.section 360bbb-3(b)(1), unless the authorization is terminated  or revoked sooner.       Influenza A by PCR NEGATIVE NEGATIVE Final   Influenza B by PCR NEGATIVE NEGATIVE Final    Comment: (NOTE) The Xpert Xpress SARS-CoV-2/FLU/RSV plus assay is intended as an aid in the diagnosis of influenza from Nasopharyngeal swab specimens and should not be used as a sole basis for treatment. Nasal washings and aspirates are unacceptable for Xpert Xpress SARS-CoV-2/FLU/RSV testing.  Fact Sheet for Patients: bloggercourse.com  Fact Sheet for Healthcare Providers: seriousbroker.it  This test is not yet approved or cleared by the [place]  FDA and has been authorized for detection and/or diagnosis of SARS-CoV-2 by FDA under an Emergency Use Authorization (EUA). This EUA will remain in effect (meaning this test can be used) for the duration of the COVID-19 declaration under Section 564(b)(1) of the Act, 21 U.S.C. section 360bbb-3(b)(1), unless the authorization is terminated or revoked.     Resp Syncytial Virus by PCR NEGATIVE NEGATIVE Final    Comment: (NOTE) Fact Sheet for Patients: bloggercourse.com  Fact Sheet for Healthcare Providers: seriousbroker.it  This test is not yet approved or cleared by the [place]  FDA and has been authorized for detection and/or diagnosis of SARS-CoV-2 by FDA under an Emergency Use  Authorization (EUA). This EUA will remain in effect (meaning this test can be used) for the duration of the COVID-19 declaration under Section 564(b)(1) of the Act, 21 U.S.C. section 360bbb-3(b)(1), unless the authorization is terminated or revoked.  Performed at [facility], [address], [place], [place] [address]   Blood Culture (routine x 2)     Status: None (Preliminary result)   Collection Time: 05/06/24  6:31 PM   Specimen: BLOOD  Result Value Ref Range Status   Specimen Description   Final    BLOOD LEFT ANTECUBITAL Performed at [facility], [address], [place], [place] [address]    Special Requests   Final    Blood Culture adequate volume BOTTLES DRAWN AEROBIC AND ANAEROBIC Performed at [facility], [address], [place], [place] [address]    Culture   Final    NO GROWTH 3 DAYS Performed at [facility], [address] [place], [place] [address]    Report  Status PENDING  Incomplete  Blood Culture (routine x 2)     Status: None (Preliminary result)   Collection Time: 05/06/24  7:15 PM   Specimen: BLOOD  Result Value Ref Range Status   Specimen Description   Final    BLOOD RIGHT ANTECUBITAL Performed at [facility], [address] [place], [place] [address]    Special Requests   Final    Blood Culture adequate volume BOTTLES DRAWN AEROBIC AND ANAEROBIC Performed at [facility], [address], [place], [place] [address]    Culture   Final    NO GROWTH 3 DAYS Performed at [facility], [address], [place], [place] [address]    Report Status PENDING  Incomplete  MRSA Next Gen by PCR, Nasal     Status: Abnormal   Collection Time: 05/06/24 10:41 PM   Specimen: Nasal Mucosa; Nasal Swab  Result Value Ref Range Status   MRSA by PCR Next Gen DETECTED (A) NOT DETECTED Final    Comment: RESULTS CALLED TO READ BACK BY AND VERIFED WITH RN  M.LAYTON ON 05/07/24 AT 0021 BY NM (NOTE) The GeneXpert MRSA Assay (FDA approved for NASAL specimens only), is one component of a comprehensive MRSA colonization surveillance program. It is not intended to diagnose MRSA infection nor to guide or monitor treatment for MRSA infections. Test performance is not FDA approved in patients less than 54 years old. Performed at [facility], [address], [place], [place] [address]   Respiratory (~20 pathogens) panel by PCR     Status: None   Collection Time: 05/06/24 11:35 PM   Specimen: Nasopharyngeal Swab; Respiratory  Result Value Ref Range Status   Adenovirus NOT DETECTED NOT DETECTED Final   Coronavirus 229E NOT DETECTED NOT DETECTED Final    Comment: (NOTE) The Coronavirus on the Respiratory Panel, DOES NOT test for the novel  Coronavirus (2019 nCoV)    Coronavirus HKU1 NOT DETECTED NOT DETECTED Final   Coronavirus NL63 NOT DETECTED NOT DETECTED Final   Coronavirus OC43 NOT DETECTED NOT DETECTED Final   Metapneumovirus NOT DETECTED NOT DETECTED Final   Rhinovirus / Enterovirus NOT DETECTED NOT DETECTED Final   Influenza A NOT DETECTED NOT DETECTED Final   Influenza B NOT DETECTED NOT DETECTED Final   Parainfluenza Virus 1 NOT DETECTED NOT DETECTED Final   Parainfluenza Virus 2 NOT DETECTED NOT DETECTED Final   Parainfluenza Virus 3 NOT DETECTED NOT DETECTED Final   Parainfluenza Virus 4 NOT DETECTED NOT DETECTED Final   Respiratory Syncytial Virus NOT DETECTED NOT DETECTED Final   Bordetella pertussis NOT DETECTED NOT DETECTED Final   Bordetella Parapertussis NOT DETECTED NOT DETECTED Final   Chlamydophila pneumoniae NOT DETECTED NOT DETECTED Final   Mycoplasma pneumoniae NOT DETECTED NOT DETECTED Final    Comment: Performed at [facility], [address], [place], [place] [address]     Discharge Instructions:   Discharge Instructions     Diet general   Complete by: As directed    Increase activity slowly    Complete by: As directed       Allergies as of 05/09/2024       Reactions   Simvastatin Rash   Cortisone Nausea And Vomiting   Albuterol  Palpitations, Other (See Comments)   Pt is okay to take xopenex , albuterol  raises his heart rate   Augmentin [amoxicillin-pot Clavulanate] Rash   Ciprofloxacin Rash   Contrast Media [iodinated Contrast Media] Rash   Flagyl  [  metronidazole ] Rash   Latex Hives, Rash   Sulfa  Antibiotics Rash        Medication List     TAKE these medications    ALPRAZolam  0.25 MG tablet Commonly known as: XANAX  Take 1 tablet (0.25 mg total) by mouth 2 (two) times daily as needed for anxiety or sleep.   Breztri  Aerosphere 160-9-4.8 MCG/ACT Aero inhaler Generic drug: budesonide -glycopyrrolate -formoterol  Inhale 2 puffs into the lungs 2 (two) times daily.   busPIRone  10 MG tablet Commonly known as: BUSPAR  Take 1 tablet (10 mg total) by mouth 2 (two) times daily.   Eliquis  5 MG Tabs tablet Generic drug: apixaban  Take 1 tablet (5 mg total) by mouth 2 (two) times daily.   esomeprazole  20 MG capsule Commonly known as: NEXIUM  Take 20 mg by mouth daily at 12 noon.   feeding supplement Liqd Take 237 mLs by mouth 2 (two) times daily between meals.   furosemide  20 MG tablet Commonly known as: LASIX  Take 1 tablet (20 mg total) by mouth daily as needed for fluid or edema.   guaiFENesin  600 MG 12 hr tablet Commonly known as: MUCINEX  Take 1 tablet (600 mg total) by mouth 2 (two) times daily.   hydrochlorothiazide  12.5 MG tablet Commonly known as: HYDRODIURIL  Take 1 tablet (12.5 mg total) by mouth as needed. What changed: reasons to take this   HYDROcodone -acetaminophen  5-325 MG tablet Commonly known as: NORCO/VICODIN Take 1 tablet by mouth every 4 (four) to 6 (six) hours as needed for pain.   ibuprofen 200 MG tablet Commonly known as: ADVIL Take 200 mg by mouth every 6 (six) hours as needed for mild pain (pain score 1-3) or moderate pain (pain score  4-6).   levalbuterol  0.63 MG/3ML nebulizer solution Commonly known as: Xopenex  Take 3 mLs (0.63 mg total) by nebulization every 6 (six) hours as needed for wheezing or shortness of breath.   levalbuterol  45 MCG/ACT inhaler Commonly known as: XOPENEX  HFA Inhale 2 puffs into the lungs every 6 (six) hours as needed for wheezing.   metFORMIN  500 MG 24 hr tablet Commonly known as: GLUCOPHAGE -XR Take 1 tablet (500 mg total) by mouth daily. What changed:  when to take this additional instructions   metoprolol  succinate 25 MG 24 hr tablet Commonly known as: TOPROL -XL Take 1 tablet (25 mg total) by mouth 2 (two) times daily.   omeprazole  20 MG capsule Commonly known as: PRILOSEC Take 1 capsule (20 mg total) by mouth daily.   ondansetron  4 MG tablet Commonly known as: ZOFRAN  Take 1 tablet (4 mg total) by mouth 3 (three) times daily as needed for nausea/vomiting.   triamcinolone  55 MCG/ACT Aero nasal inhaler Commonly known as: NASACORT  Place 1 spray into the nose daily. What changed: when to take this   Tylenol  325 MG Caps Generic drug: Acetaminophen  Take 1-2 capsules by mouth every 6 (six) hours as needed (pain).   VITAMIN B12 PO Take 1 tablet by mouth daily.        Follow-up Information     [name], [name], MD Follow up in 1 week(s).   Specialty: Family Medicine Contact information: [address] [place] [place] [address] [phone]         Jude Harden GAILS, MD Follow up.   Specialty: Pulmonary Disease Contact information: [address] [place] [address] [phone]         Debera [name], MD Follow up.   Specialty: Cardiology Contact information: [address] [place] [address] [phone]  Time coordinating discharge: 45 min  Signed:  Harlene RAYMOND Bowl DO  [facility] 05/09/2024, 12:18 PM

## 2024-05-09 NOTE — Progress Notes (Signed)
 Called daughter to discuss d/c, no answer and VM full

## 2024-05-09 NOTE — Plan of Care (Signed)
   Problem: Coping: Goal: Level of anxiety will decrease Outcome: Progressing

## 2024-05-09 NOTE — Evaluation (Signed)
 Physical Therapy Evaluation Patient Details Name: Matthew Hall MRN: [id number] DOB: [birth year] Today's Date: 05/09/2024  History of Present Illness  Pt is a 78 y.o. 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 PMH - recent CVA's, rt foot drop, atrial fibrillation, COPD, hypertension, vasculitis, diabetes, diabetic neuropathy, bladder cancer.  Clinical Impression  PTA, pt lives with his spouse at [facility] and uses a Rollator for mobility. Pt presents with decreased cardiopulmonary endurance and weakness in comparison to his baseline. He is mobilizing fairly, ambulating 300 ft with a Rollator and CGA. SpO2 91-96% on RA, HR 83-100. Encouraged pt to sit upright throughout the day and progress activity as tolerated. Recommend follow up PT at ILF to address deficits and maximize functional mobility.         If plan is discharge home, recommend the following: Assistance with cooking/housework;Assist for transportation   Can travel by private Data Processing Manager (4 wheels)  Recommendations for Other Services       Functional Status Assessment Patient has had a recent decline in their functional status and demonstrates the ability to make significant improvements in function in a reasonable and predictable amount of time.     Precautions / Restrictions Precautions Precautions: Fall Restrictions Weight Bearing Restrictions Per Provider Order: No      Mobility  Bed Mobility Overal bed mobility: Modified Independent                  Transfers Overall transfer level: Needs assistance Equipment used: Rollator (4 wheels) Transfers: Sit to/from Stand Sit to Stand: Supervision                Ambulation/Gait Ambulation/Gait assistance: Contact guard assist Gait Distance (Feet): 300 Feet Assistive device: Rollator (4 wheels) Gait Pattern/deviations:  Step-through pattern, Decreased stride length Gait velocity: decreased Gait velocity interpretation: <1.8 ft/sec, indicate of risk for recurrent falls   General Gait Details: slow but steady pace, difficulty with obstacle negotiation  Stairs            Wheelchair Mobility     Tilt Bed    Modified Rankin (Stroke Patients Only)       Balance Overall balance assessment: Mild deficits observed, not formally tested                                           Pertinent Vitals/Pain Pain Assessment Pain Assessment: No/denies pain    Home Living Family/patient expects to be discharged to:: Private residence Living Arrangements: Spouse/significant other Available Help at Discharge: Family;Available PRN/intermittently Type of Home: Independent living facility Home Access: Level entry       Home Layout: One level Home Equipment: Scientist, Research (medical) (4 wheels);Rolling Walker (2 wheels);Hand held shower head;Adaptive equipment;Shower seat      Prior Function Prior Level of Function : Independent/Modified Independent             Mobility Comments: ambulates with Rollator ADLs Comments: Ind with ADLs/selfcare, does not drive. pharmacy pre-packages medications for pt     Extremity/Trunk Assessment   Upper Extremity Assessment Upper Extremity Assessment: Overall WFL for tasks assessed    Lower Extremity Assessment Lower Extremity Assessment: Generalized weakness    Cervical / Trunk Assessment Cervical / Trunk Assessment: Kyphotic  Communication   Communication Communication: No apparent  difficulties    Cognition Arousal: Alert Behavior During Therapy: WFL for tasks assessed/performed   PT - Cognitive impairments: No apparent impairments                         Following commands: Intact       Cueing Cueing Techniques: Verbal cues     General Comments      Exercises     Assessment/Plan    PT Assessment  Patient needs continued PT services  PT Problem List Decreased strength;Decreased activity tolerance;Decreased balance;Decreased mobility;Cardiopulmonary status limiting activity       PT Treatment Interventions DME instruction;Gait training;Functional mobility training;Therapeutic activities;Therapeutic exercise;Balance training;Patient/family education    PT Goals (Current goals can be found in the Care Plan section)  Acute Rehab PT Goals Patient Stated Goal: to feel better PT Goal Formulation: With patient Time For Goal Achievement: 05/23/24 Potential to Achieve Goals: Good    Frequency Min 2X/week     Co-evaluation               AM-PAC PT 6 Clicks Mobility  Outcome Measure Help needed turning from your back to your side while in a flat bed without using bedrails?: None Help needed moving from lying on your back to sitting on the side of a flat bed without using bedrails?: None Help needed moving to and from a bed to a chair (including a wheelchair)?: A Little Help needed standing up from a chair using your arms (e.g., wheelchair or bedside chair)?: A Little Help needed to walk in hospital room?: A Little Help needed climbing 3-5 steps with a railing? : A Little 6 Click Score: 20    End of Session Equipment Utilized During Treatment: Gait belt Activity Tolerance: Patient tolerated treatment well Patient left: in chair;with call bell/phone within reach;with chair alarm set Nurse Communication: Mobility status PT Visit Diagnosis: Unsteadiness on feet (R26.81);Muscle weakness (generalized) (M62.81);Difficulty in walking, not elsewhere classified (R26.2)    Time: 8899-8877 PT Time Calculation (min) (ACUTE ONLY): 22 min   Charges:   PT Evaluation $PT Eval Low Complexity: 1 Low   PT General Charges $$ ACUTE PT VISIT: 1 Visit         Aleck Daring, PT, DPT Acute Rehabilitation Services Office [phone]   Aleck ONEIDA Daring 05/09/2024, 11:33 AM

## 2024-05-09 NOTE — TOC Progression Note (Signed)
 Transition of Care [facility]) - Progression Note    Patient Details  Name: Matthew Hall MRN: [id number] Date of Birth: 1945/07/20  Transition [facility] CM/SW Contact  Roxie KANDICE Stain, RN Phone Number: 05/09/2024, 3:05 PM  Clinical Narrative:     Daughter, Rosina, [place] and unable to accept phone calls. This NCM reached out to daughters by text message. Rosina  wants [facility] to provide PT and [facility] will provide nursing needs.  [facility] and [facility] are aware.  Expected Discharge Plan: Home/Self Care Barriers to Discharge: Continued Medical Work up               Expected Discharge Plan and Services   Discharge Planning Services: CM Consult   Living arrangements for the past 2 months: Independent Living Facility Expected Discharge Date: 05/09/24                                     Social Drivers of Health (SDOH) Interventions SDOH Screenings   Food Insecurity: No Food Insecurity (05/06/2024)  Housing: Low Risk  (05/06/2024)  Transportation Needs: No Transportation Needs (05/06/2024)  Utilities: Not At Risk (05/06/2024)  Depression (PHQ2-9): Low Risk  (03/19/2024)  Social Connections: Moderately Isolated (05/06/2024)  Tobacco Use: Medium Risk (05/07/2024)    Readmission Risk Interventions    07/15/2023    2:01 PM 06/22/2023   11:49 AM  Readmission Risk Prevention Plan  Transportation Screening Complete Complete  Home Care Screening  Complete  Medication Review (RN CM)  Complete  Medication Review (RN Care Manager) Complete   PCP or Specialist appointment within 3-5 days of discharge Complete   HRI or Home Care Consult Complete   SW Recovery Care/Counseling Consult Complete   Palliative Care Screening Not Applicable   Skilled Nursing Facility Not Applicable

## 2024-05-09 NOTE — Progress Notes (Signed)
 Called both daughter and son in law again with NO ANSWER JV

## 2024-05-10 DIAGNOSIS — J9621 Acute and chronic respiratory failure with hypoxia: Secondary | ICD-10-CM | POA: Diagnosis not present

## 2024-05-10 DIAGNOSIS — I509 Heart failure, unspecified: Secondary | ICD-10-CM

## 2024-05-10 LAB — TECHNOLOGIST SMEAR REVIEW: Plt Morphology: NORMAL

## 2024-05-10 LAB — CBC
HCT: 31.9 % — ABNORMAL LOW (ref 39.0–52.0)
Hemoglobin: 10.1 g/dL — ABNORMAL LOW (ref 13.0–17.0)
MCH: 28 pg (ref 26.0–34.0)
MCHC: 31.7 g/dL (ref 30.0–36.0)
MCV: 88.4 fL (ref 80.0–100.0)
Platelets: 587 K/uL — ABNORMAL HIGH (ref 150–400)
RBC: 3.61 MIL/uL — ABNORMAL LOW (ref 4.22–5.81)
RDW: 13.9 % (ref 11.5–15.5)
WBC: 11.6 K/uL — ABNORMAL HIGH (ref 4.0–10.5)
nRBC: 0 % (ref 0.0–0.2)

## 2024-05-10 LAB — CBC WITH DIFFERENTIAL/PLATELET
Abs Immature Granulocytes: 0.17 K/uL — ABNORMAL HIGH (ref 0.00–0.07)
Basophils Absolute: 0.1 K/uL (ref 0.0–0.1)
Basophils Relative: 1 %
Eosinophils Absolute: 0.2 K/uL (ref 0.0–0.5)
Eosinophils Relative: 2 %
HCT: 32.1 % — ABNORMAL LOW (ref 39.0–52.0)
Hemoglobin: 10 g/dL — ABNORMAL LOW (ref 13.0–17.0)
Immature Granulocytes: 1 %
Lymphocytes Relative: 8 %
Lymphs Abs: 1 K/uL (ref 0.7–4.0)
MCH: 28.2 pg (ref 26.0–34.0)
MCHC: 31.2 g/dL (ref 30.0–36.0)
MCV: 90.7 fL (ref 80.0–100.0)
Monocytes Absolute: 1.5 K/uL — ABNORMAL HIGH (ref 0.1–1.0)
Monocytes Relative: 11 %
Neutro Abs: 10 K/uL — ABNORMAL HIGH (ref 1.7–7.7)
Neutrophils Relative %: 77 %
Platelets: 579 K/uL — ABNORMAL HIGH (ref 150–400)
RBC: 3.54 MIL/uL — ABNORMAL LOW (ref 4.22–5.81)
RDW: 14 % (ref 11.5–15.5)
WBC: 12.9 K/uL — ABNORMAL HIGH (ref 4.0–10.5)
nRBC: 0 % (ref 0.0–0.2)

## 2024-05-10 LAB — GLUCOSE, CAPILLARY: Glucose-Capillary: 108 mg/dL — ABNORMAL HIGH (ref 70–99)

## 2024-05-10 LAB — BASIC METABOLIC PANEL WITH GFR
Anion gap: 9 (ref 5–15)
BUN: 16 mg/dL (ref 8–23)
CO2: 28 mmol/L (ref 22–32)
Calcium: 8.2 mg/dL — ABNORMAL LOW (ref 8.9–10.3)
Chloride: 98 mmol/L (ref 98–111)
Creatinine, Ser: 0.87 mg/dL (ref 0.61–1.24)
GFR, Estimated: >60 mL/min (ref >60)
Glucose, Bld: 141 mg/dL — ABNORMAL HIGH (ref 70–99)
Potassium: 3.8 mmol/L (ref 3.5–5.1)
Sodium: 135 mmol/L (ref 135–145)

## 2024-05-10 MED ORDER — ACETAMINOPHEN 325 MG PO TABS: 325.0000 mg | ORAL_TABLET | Freq: Four times a day (QID) | ORAL | Status: DC | PRN

## 2024-05-10 MED ORDER — FUROSEMIDE 20 MG PO TABS
20.0000 mg | ORAL_TABLET | Freq: Every day | ORAL | Status: DC
  Administered 2024-05-10: 20 mg via ORAL
  Filled 2024-05-10: qty 1

## 2024-05-10 MED ORDER — ACETAMINOPHEN 500 MG PO TABS
500.0000 mg | ORAL_TABLET | Freq: Four times a day (QID) | ORAL | Status: DC | PRN
  Administered 2024-05-10: 1000 mg via ORAL
  Administered 2024-05-11: 500 mg via ORAL
  Administered 2024-05-12 (×2): 1000 mg via ORAL
  Administered 2024-05-16: 500 mg via ORAL
  Filled 2024-05-10 (×3): qty 2
  Filled 2024-05-10: qty 1
  Filled 2024-05-10: qty 2

## 2024-05-10 MED ORDER — POLYSACCHARIDE IRON COMPLEX 150 MG PO CAPS
150.0000 mg | ORAL_CAPSULE | Freq: Every day | ORAL | Status: DC
  Administered 2024-05-10 – 2024-05-17 (×8): 150 mg via ORAL
  Filled 2024-05-10 (×8): qty 1

## 2024-05-10 NOTE — Plan of Care (Signed)
  Problem: Clinical Measurements: Goal: Will remain free from infection Outcome: Progressing Goal: Diagnostic test results will improve Outcome: Progressing Goal: Respiratory complications will improve Outcome: Progressing Goal: Cardiovascular complication will be avoided Outcome: Progressing   Problem: Nutrition: Goal: Adequate nutrition will be maintained Outcome: Progressing   Problem: Coping: Goal: Level of anxiety will decrease Outcome: Progressing   Problem: Pain Managment: Goal: General experience of comfort will improve and/or be controlled Outcome: Progressing   Problem: Safety: Goal: Ability to remain free from injury will improve Outcome: Progressing

## 2024-05-10 NOTE — Progress Notes (Signed)
 Called daughter- no answer and mail box full JV

## 2024-05-10 NOTE — Plan of Care (Signed)
  Problem: Health Behavior/Discharge Planning: Goal: Ability to manage health-related needs will improve Outcome: Progressing   Problem: Clinical Measurements: Goal: Respiratory complications will improve Outcome: Progressing   Problem: Activity: Goal: Risk for activity intolerance will decrease Outcome: Progressing   

## 2024-05-10 NOTE — Progress Notes (Signed)
 PROGRESS NOTE    Matthew Hall  FMW:[id number] DOB: 09-07-1945 DOA: 05/06/2024 PCP: Candise [name], MD    Brief Narrative:  78 y.o. 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 D/C held as family out of town on vacation   Assessment and Plan: Suspected acute exacerbation of diastolic heart failure Chronic hypoxic respiratory failure - 2L at night --pulm consulted considering complex underlying lung disease -Not convinced that this is a COPD exacerbation, certainly not bronchiectasis exacerbation and he does not have CAP so doubt need for antibiotics - He has improved with diuresis, has mildly elevated BNP previous echo showed EF 40 to 45% and small effusions noted on imaging, decompensated heart failure possible -- CT chest done-no new lung consolidations, trace pleural effusions -- stop abx as per recs from pccm as procalcitonin negative -- Hold off on steroids as no wheezing  -- Received diuresis at OSH with concern for HF-have given Lasix  x 2 in total with close to 4 L of diuresis and improvement in breathing-since patient still hospitalized, will give Lasix  daily so we can monitor his labs -Weights not accurate as they are not standing.  -echo: EF 50 to 55%-improved from prior -- ESR, CRP-done and elevated but nonspecific- -- ANA, ANCA, C3/4 with his prior hx vasculitis are negative -- Continue home Breztri . saline neb BID, Duonebs (sub alb for xopenex ) q6 hr, IS, guaifenisen, OOB to chair during the day  -- wean O2  to off during the day-- only wears at night   Mild elevation of white blood cell count - No fever - Smear ordered - Family concerned about malignancies-can place outpatient referral to hematology/oncology for further workup - White blood cell count has been elevated for years on and off   Elevated troponin Minimal elevation just above reference limit < 15 -> 21 hs troponin. EKG is limited by artifact but no overt ischemic changes. And he has no component of chest pain. Suspect demand in setting of his acute respiratory process above.  -- Outpatient follow-up with cardiology   Recent dental extraction  -- No signs of complication on exam, well healed.  -Patient denies any mouth pain or discomfort -Eating well-80% of meal was documented as eaten on 10/31-fortunately no documentation since but patient reports eating well   Deconditioning  -- PT evaluation -walked 300 feet on room air with PT   Hypokalemia/hypomagnesemia/hypophosphatemia -repleted   Anemia-iron deficiency -suspect related to poor po intake/blood loss from recent dental extractions -hgb stable -PCP follow up - Iron p.o. for now - Suspect that is why patient's platelets are elevated   Nutrition Status: Nutrition Problem: Severe Malnutrition Etiology: social / environmental circumstances Signs/Symptoms: percent weight loss, severe muscle depletion, moderate fat depletion Percent weight loss: 22 % (in one year) Interventions: Glucerna shake, MVI   DVT prophylaxis:  apixaban  (ELIQUIS ) tablet 5 mg    Code Status: Full Code Family Communication: See previous message about calling daughter  Disposition Plan:  Level of care: Med-Surg Status is: Inpatient     Consultants:  Pulmonary  Subjective: Patient continues to state he feels well, no complaints He  says mouth occasionally gets sore but easily corrected with 1 Tylenol   Objective: Vitals:   05/10/24 0700 05/10/24 0735 05/10/24 1026 05/10/24 1137  BP:   (!) 142/84 (!) 144/66 (!) 138/56  Pulse:  87 83 83  Resp:  (!) 27  20  Temp:  98.1 F (36.7 C)  98.2 F (36.8 C)  TempSrc:  Oral  Oral  SpO2:  92%  93%  Weight: 60.3 kg     Height:        Intake/Output Summary (Last 24 hours) at 05/10/2024 1323 Last data filed at 05/10/2024 0700 Gross per 24 hour  Intake 363 ml  Output 1200 ml  Net -837 ml   Filed Weights   05/08/24 0503 05/09/24 0320 05/10/24 0700  Weight: 61.6 kg 62.8 kg 60.3 kg    Examination:   General: Appearance:    Thin male in no acute distress   Able to speak in complete sentences with no issue  Lungs:    On room air, respirations unlabored  Heart:    Normal heart rate.    MS:   All extremities are intact.    Neurologic:   Awake, alert, oriented x 3. No apparent focal neurological           defect.        Data Reviewed: I have personally reviewed following labs and imaging studies  CBC: Recent Labs  Lab 05/07/24 0303 05/08/24 1143 05/09/24 1352 05/10/24 0214 05/10/24 1244  WBC 14.2* 11.3* 12.4* 11.6* 12.9*  NEUTROABS  --   --   --   --  10.0*  HGB 10.7* 10.4* 10.7* 10.1* 10.0*  HCT 33.0* 33.0* 34.0* 31.9* 32.1*  MCV 88.2 90.4 89.9 88.4 90.7  PLT 564* 549* 625* 587* 579*   Basic Metabolic Panel: Recent Labs  Lab 05/06/24 1831 05/07/24 0303 05/09/24 0235 05/10/24 0214  NA 134* 132* 135 135  K 4.3 3.2* 3.5 3.8  CL 96* 90* 96* 98  CO2 28 29 27 28   GLUCOSE 100* 159* 135* 141*  BUN 14 16 16 16   CREATININE 0.82 0.80 0.84 0.87  CALCIUM  9.2 8.9 8.4* 8.2*  MG  --  1.6* 2.0  --   PHOS  --  2.2* 2.8  --    GFR: Estimated Creatinine Clearance: 57.8 mL/min (by C-G formula based on SCr of 0.87 mg/dL). Liver Function Tests: Recent Labs  Lab 05/06/24 1831  AST 27  ALT 23  ALKPHOS 140*  BILITOT 0.6  PROT 7.2  ALBUMIN 3.2*   No results for input(s): LIPASE, AMYLASE in the last 168 hours. No results for input(s): AMMONIA in the last 168 hours. Coagulation Profile: Recent Labs  Lab  05/06/24 1831  INR 1.6*   Cardiac Enzymes: No results for input(s): CKTOTAL, CKMB, CKMBINDEX, TROPONINI in the last 168 hours. BNP (last 3 results) Recent Labs    09/10/23 1513 05/06/24 1831  PROBNP 609* 5,188.0*   HbA1C: No results for input(s): HGBA1C in the last 72 hours. CBG: Recent Labs  Lab 05/06/24 1809 05/06/24 2232 05/08/24 0802 05/09/24 0521 05/10/24 0612  GLUCAP 95 110* 147* 115* 108*   Lipid Profile: No results for input(s): CHOL, HDL, LDLCALC, TRIG, CHOLHDL, LDLDIRECT in the last 72 hours. Thyroid  Function Tests: No results for input(s): TSH, T4TOTAL, FREET4, T3FREE, THYROIDAB in the last 72 hours. Anemia Panel: Recent Labs    05/09/24 1849  FERRITIN 130  TIBC 262  IRON 18*   Sepsis Labs: Recent Labs  Lab 05/06/24 1831  05/07/24 0303  PROCALCITON  --  <0.10  LATICACIDVEN 1.2  --     Recent Results (from the past 240 hours)  Blood Culture (routine x 2)     Status: None   Collection Time: 04/30/24  8:49 PM   Specimen: BLOOD  Result Value Ref Range Status   Specimen Description   Final    BLOOD RIGHT ANTECUBITAL Performed at [facility], [address], [place], [place] [address]    Special Requests   Final    BOTTLES DRAWN AEROBIC AND ANAEROBIC Blood Culture adequate volume Performed at [facility], [address], [place], [place] [address]    Culture   Final    NO GROWTH 5 DAYS Performed at [facility], [address], [place], [place] [address]    Report Status 05/05/2024 FINAL  Final  Blood Culture (routine x 2)     Status: None   Collection Time: 04/30/24  8:50 PM   Specimen: BLOOD  Result Value Ref Range Status   Specimen Description   Final    BLOOD LEFT ANTECUBITAL Performed at [facility], [address], [place], [place] [address]    Special Requests   Final    BOTTLES DRAWN AEROBIC AND ANAEROBIC Blood Culture adequate  volume Performed at [facility], [address] [place], [place] [address]    Culture   Final    NO GROWTH 5 DAYS Performed at [facility], [address] [place], [place] [address]    Report Status 05/05/2024 FINAL  Final  Resp panel by RT-PCR (RSV, Flu A&B, Covid)     Status: None   Collection Time: 04/30/24  8:50 PM   Specimen: Nasal Swab  Result Value Ref Range Status   SARS Coronavirus 2 by RT PCR NEGATIVE NEGATIVE Final    Comment: (NOTE) SARS-CoV-2 target nucleic acids are NOT DETECTED.  The SARS-CoV-2 RNA is generally detectable in upper respiratory specimens during the acute phase of infection. The lowest concentration of SARS-CoV-2 viral copies this assay can detect is 138 copies/mL. A negative result does not preclude SARS-Cov-2 infection and should not be used as the sole basis for treatment or other patient management decisions. A negative result may occur with  improper specimen collection/handling, submission of specimen other than nasopharyngeal swab, presence of viral mutation(s) within the areas targeted by this assay, and inadequate number of viral copies(<138 copies/mL). A negative result must be combined with clinical observations, patient history, and epidemiological information. The expected result is Negative.  Fact Sheet for Patients:  bloggercourse.com  Fact Sheet for Healthcare Providers:  seriousbroker.it  This test is no t yet approved or cleared by the [place]  FDA and  has been authorized for detection and/or diagnosis of SARS-CoV-2 by FDA under an Emergency Use Authorization (EUA). This EUA will remain  in effect (meaning this test can be used) for the duration of the COVID-19 declaration under Section 564(b)(1) of the Act, 21 U.S.C.section 360bbb-3(b)(1), unless the authorization is terminated  or revoked sooner.       Influenza A by PCR NEGATIVE NEGATIVE  Final   Influenza B by PCR NEGATIVE NEGATIVE Final    Comment: (NOTE) The Xpert Xpress SARS-CoV-2/FLU/RSV plus assay is intended as an aid in the diagnosis of influenza from Nasopharyngeal swab specimens and should not be used as a sole basis for treatment. Nasal washings and aspirates are unacceptable for Xpert Xpress SARS-CoV-2/FLU/RSV testing.  Fact Sheet for Patients: bloggercourse.com  Fact Sheet for Healthcare Providers: seriousbroker.it  This test is not yet approved or cleared by the [place]  FDA and has been authorized for detection and/or diagnosis of SARS-CoV-2 by FDA under an Emergency Use Authorization (EUA). This EUA will remain in effect (meaning this test can be used) for the duration of the COVID-19 declaration under Section 564(b)(1) of the Act, 21 U.S.C. section 360bbb-3(b)(1), unless the authorization is terminated or revoked.     Resp Syncytial Virus by PCR NEGATIVE NEGATIVE Final    Comment: (NOTE) Fact Sheet for Patients: bloggercourse.com  Fact Sheet for Healthcare Providers: seriousbroker.it  This test is not yet approved or cleared by the [place]  FDA and has been authorized for detection and/or diagnosis of SARS-CoV-2 by FDA under an Emergency Use Authorization (EUA). This EUA will remain in effect (meaning this test can be used) for the duration of the COVID-19 declaration under Section 564(b)(1) of the Act, 21 U.S.C. section 360bbb-3(b)(1), unless the authorization is terminated or revoked.  Performed at [facility], [address], [place], [place] [address]   Resp panel by RT-PCR (RSV, Flu A&B, Covid) Anterior Nasal Swab     Status: None   Collection Time: 05/06/24  6:16 PM   Specimen: Anterior Nasal Swab  Result Value Ref Range Status   SARS Coronavirus 2 by RT PCR NEGATIVE NEGATIVE Final    Comment:  (NOTE) SARS-CoV-2 target nucleic acids are NOT DETECTED.  The SARS-CoV-2 RNA is generally detectable in upper respiratory specimens during the acute phase of infection. The lowest concentration of SARS-CoV-2 viral copies this assay can detect is 138 copies/mL. A negative result does not preclude SARS-Cov-2 infection and should not be used as the sole basis for treatment or other patient management decisions. A negative result may occur with  improper specimen collection/handling, submission of specimen other than nasopharyngeal swab, presence of viral mutation(s) within the areas targeted by this assay, and inadequate number of viral copies(<138 copies/mL). A negative result must be combined with clinical observations, patient history, and epidemiological information. The expected result is Negative.  Fact Sheet for Patients:  bloggercourse.com  Fact Sheet for Healthcare Providers:  seriousbroker.it  This test is no t yet approved or cleared by the [place]  FDA and  has been authorized for detection and/or diagnosis of SARS-CoV-2 by FDA under an Emergency Use Authorization (EUA). This EUA will remain  in effect (meaning this test can be used) for the duration of the COVID-19 declaration under Section 564(b)(1) of the Act, 21 U.S.C.section 360bbb-3(b)(1), unless the authorization is terminated  or revoked sooner.       Influenza A by PCR NEGATIVE NEGATIVE Final   Influenza B by PCR NEGATIVE NEGATIVE Final    Comment: (NOTE) The Xpert Xpress SARS-CoV-2/FLU/RSV plus assay is intended as an aid in the diagnosis of influenza from Nasopharyngeal swab specimens and should not be used as a sole basis for treatment. Nasal washings and aspirates are unacceptable for Xpert Xpress SARS-CoV-2/FLU/RSV testing.  Fact Sheet for Patients: bloggercourse.com  Fact Sheet for Healthcare  Providers: seriousbroker.it  This test is not yet approved or cleared by the [place]  FDA and has been authorized for detection and/or diagnosis of SARS-CoV-2 by FDA under an Emergency Use Authorization (EUA). This EUA will remain in effect (meaning this test can be used) for the duration of the COVID-19 declaration under Section 564(b)(1) of the Act, 21 U.S.C. section 360bbb-3(b)(1), unless the authorization is terminated or revoked.     Resp Syncytial  Virus by PCR NEGATIVE NEGATIVE Final    Comment: (NOTE) Fact Sheet for Patients: bloggercourse.com  Fact Sheet for Healthcare Providers: seriousbroker.it  This test is not yet approved or cleared by the [place]  FDA and has been authorized for detection and/or diagnosis of SARS-CoV-2 by FDA under an Emergency Use Authorization (EUA). This EUA will remain in effect (meaning this test can be used) for the duration of the COVID-19 declaration under Section 564(b)(1) of the Act, 21 U.S.C. section 360bbb-3(b)(1), unless the authorization is terminated or revoked.  Performed at [facility], [address] [place], [place] [address]   Blood Culture (routine x 2)     Status: None (Preliminary result)   Collection Time: 05/06/24  6:31 PM   Specimen: BLOOD  Result Value Ref Range Status   Specimen Description   Final    BLOOD LEFT ANTECUBITAL Performed at [facility], [address] [place], [place] [address]    Special Requests   Final    Blood Culture adequate volume BOTTLES DRAWN AEROBIC AND ANAEROBIC Performed at [facility], [address] [place], [place] [address]    Culture   Final    NO GROWTH 4 DAYS Performed at [facility], [address] [place], [place] [address]    Report Status PENDING  Incomplete  Blood Culture (routine x 2)     Status: None (Preliminary  result)   Collection Time: 05/06/24  7:15 PM   Specimen: BLOOD  Result Value Ref Range Status   Specimen Description   Final    BLOOD RIGHT ANTECUBITAL Performed at [facility], [address] [place], [place] [address]    Special Requests   Final    Blood Culture adequate volume BOTTLES DRAWN AEROBIC AND ANAEROBIC Performed at [facility], [address], [place], [place] [address]    Culture   Final    NO GROWTH 4 DAYS Performed at [facility], [address] [place], [place] [address]    Report Status PENDING  Incomplete  MRSA Next Gen by PCR, Nasal     Status: Abnormal   Collection Time: 05/06/24 10:41 PM   Specimen: Nasal Mucosa; Nasal Swab  Result Value Ref Range Status   MRSA by PCR Next Gen DETECTED (A) NOT DETECTED Final    Comment: RESULTS CALLED TO READ BACK BY AND VERIFED WITH RN M.LAYTON ON 05/07/24 AT 0021 BY NM (NOTE) The GeneXpert MRSA Assay (FDA approved for NASAL specimens only), is one component of a comprehensive MRSA colonization surveillance program. It is not intended to diagnose MRSA infection nor to guide or monitor treatment for MRSA infections. Test performance is not FDA approved in patients less than 5 years old. Performed at [facility], [address] [place], [place] [address]   Respiratory (~20 pathogens) panel by PCR     Status: None   Collection Time: 05/06/24 11:35 PM   Specimen: Nasopharyngeal Swab; Respiratory  Result Value Ref Range Status   Adenovirus NOT DETECTED NOT DETECTED Final   Coronavirus 229E NOT DETECTED NOT DETECTED Final    Comment: (NOTE) The Coronavirus on the Respiratory Panel, DOES NOT test for the novel  Coronavirus (2019 nCoV)    Coronavirus HKU1 NOT DETECTED NOT DETECTED Final   Coronavirus NL63 NOT DETECTED NOT DETECTED Final   Coronavirus OC43 NOT DETECTED NOT DETECTED Final   Metapneumovirus NOT DETECTED NOT DETECTED Final   Rhinovirus / Enterovirus  NOT DETECTED NOT  DETECTED Final   Influenza A NOT DETECTED NOT DETECTED Final   Influenza B NOT DETECTED NOT DETECTED Final   Parainfluenza Virus 1 NOT DETECTED NOT DETECTED Final   Parainfluenza Virus 2 NOT DETECTED NOT DETECTED Final   Parainfluenza Virus 3 NOT DETECTED NOT DETECTED Final   Parainfluenza Virus 4 NOT DETECTED NOT DETECTED Final   Respiratory Syncytial Virus NOT DETECTED NOT DETECTED Final   Bordetella pertussis NOT DETECTED NOT DETECTED Final   Bordetella Parapertussis NOT DETECTED NOT DETECTED Final   Chlamydophila pneumoniae NOT DETECTED NOT DETECTED Final   Mycoplasma pneumoniae NOT DETECTED NOT DETECTED Final    Comment: Performed at [facility], [address] [place], [place] [address]         Radiology Studies: ECHOCARDIOGRAM COMPLETE Result Date: 05/08/2024    ECHOCARDIOGRAM REPORT   Patient Name:   EDEL RIVERO Date of Exam: 05/08/2024 Medical Rec #:  [id number]         Height:       71.0 in Accession #:    [id number]        Weight:       135.9 lb Date of Birth:  1946/04/04        BSA:          1.789 m Patient Age:    78 years          BP:           108/76 mmHg Patient Gender: M                 HR:           71 bpm. Exam Location:  Inpatient Procedure: 2D Echo, Cardiac Doppler and Color Doppler (Both Spectral and Color            Flow Doppler were utilized during procedure). Indications:    CHF I50.31  History:        Patient has prior history of Echocardiogram examinations, most                 recent 04/13/2023. CHF, Stroke, Signs/Symptoms:Shortness of                 Breath; Risk Factors:Hypertension and Diabetes.  Sonographer:    BERNARDA ROCKS Referring Phys: [id number] Sebastain Fishbaugh U Raynald Rouillard IMPRESSIONS  1. Left ventricular ejection fraction, by estimation, is 50 to 55%. The left ventricle has low normal function. The left ventricle demonstrates global hypokinesis. Left ventricular diastolic parameters were normal.  2. Right ventricular systolic function is normal.  The right ventricular size is normal. There is moderately elevated pulmonary artery systolic pressure. The estimated right ventricular systolic pressure is 46.6 mmHg.  3. Left atrial size was severely dilated.  4. Right atrial size was severely dilated.  5. The mitral valve is degenerative. Trivial mitral valve regurgitation. No evidence of mitral stenosis. The mean mitral valve gradient is 3.0 mmHg.  6. The aortic valve is normal in structure. Aortic valve regurgitation is not visualized. No aortic stenosis is present.  7. The inferior vena cava is dilated in size with <50% respiratory variability, suggesting right atrial pressure of 15 mmHg. FINDINGS  Left Ventricle: Left ventricular ejection fraction, by estimation, is 50 to 55%. The left ventricle has low normal function. The left ventricle demonstrates global hypokinesis. The left ventricular internal cavity size was normal in size. There is no left ventricular hypertrophy. Abnormal (paradoxical) septal motion, consistent with left bundle branch block. Left ventricular diastolic parameters were  normal. Normal left ventricular filling pressure. Right Ventricle: The right ventricular size is normal. No increase in right ventricular wall thickness. Right ventricular systolic function is normal. There is moderately elevated pulmonary artery systolic pressure. The tricuspid regurgitant velocity is 2.81 m/s, and with an assumed right atrial pressure of 15 mmHg, the estimated right ventricular systolic pressure is 46.6 mmHg. Left Atrium: Left atrial size was severely dilated. Right Atrium: Right atrial size was severely dilated. Pericardium: There is no evidence of pericardial effusion. Mitral Valve: The mitral valve is degenerative in appearance. There is mild calcification of the mitral valve leaflet(s). Mild mitral annular calcification. Trivial mitral valve regurgitation. No evidence of mitral valve stenosis. MV peak gradient, 8.1 mmHg. The mean mitral valve  gradient is 3.0 mmHg. Tricuspid Valve: The tricuspid valve is normal in structure. Tricuspid valve regurgitation is mild . No evidence of tricuspid stenosis. Aortic Valve: The aortic valve is normal in structure. Aortic valve regurgitation is not visualized. No aortic stenosis is present. Aortic valve mean gradient measures 5.0 mmHg. Aortic valve peak gradient measures 8.5 mmHg. Aortic valve area, by VTI measures 1.45 cm. Pulmonic Valve: The pulmonic valve was normal in structure. Pulmonic valve regurgitation is trivial. No evidence of pulmonic stenosis. Aorta: The aortic root is normal in size and structure. Venous: The inferior vena cava is dilated in size with less than 50% respiratory variability, suggesting right atrial pressure of 15 mmHg. IAS/Shunts: No atrial level shunt detected by color flow Doppler.  LEFT VENTRICLE PLAX 2D LVIDd:         5.50 cm      Diastology LVIDs:         3.40 cm      LV e' medial:    8.16 cm/s LV PW:         1.00 cm      LV E/e' medial:  14.2 LV IVS:        0.90 cm      LV e' lateral:   14.80 cm/s LVOT diam:     2.00 cm      LV E/e' lateral: 7.8 LV SV:         41 LV SV Index:   23 LVOT Area:     3.14 cm  LV Volumes (MOD) LV vol d, MOD A2C: 114.0 ml LV vol d, MOD A4C: 112.0 ml LV vol s, MOD A2C: 50.7 ml LV vol s, MOD A4C: 52.1 ml LV SV MOD A2C:     63.3 ml LV SV MOD A4C:     112.0 ml LV SV MOD BP:      61.5 ml RIGHT VENTRICLE            IVC RV Basal diam:  3.50 cm    IVC diam: 2.60 cm TAPSE (M-mode): 1.9 cm RVSP:           46.6 mmHg LEFT ATRIUM              Index        RIGHT ATRIUM            Index LA diam:        5.20 cm  2.91 cm/m   RA Pressure: 15.00 mmHg LA Vol (A2C):   127.0 ml 70.98 ml/m  RA Area:     30.30 cm LA Vol (A4C):   110.0 ml 61.48 ml/m  RA Volume:   111.00 ml  62.04 ml/m LA Biplane Vol: 123.0 ml 68.74 ml/m  AORTIC VALVE  PULMONIC VALVE AV Area (Vmax):    1.65 cm      PV Vmax:          0.87 m/s AV Area (Vmean):   1.31 cm      PV Peak grad:      3.0 mmHg AV Area (VTI):     1.45 cm      PR End Diast Vel: 3.76 msec AV Vmax:           146.00 cm/s AV Vmean:          101.000 cm/s AV VTI:            0.284 m AV Peak Grad:      8.5 mmHg AV Mean Grad:      5.0 mmHg LVOT Vmax:         76.50 cm/s LVOT Vmean:        42.100 cm/s LVOT VTI:          0.131 m LVOT/AV VTI ratio: 0.46  AORTA Ao Root diam: 2.90 cm Ao Asc diam:  2.70 cm MITRAL VALVE                TRICUSPID VALVE MV Area (PHT): 5.16 cm     TR Peak grad:   31.6 mmHg MV Area VTI:   1.84 cm     TR Vmax:        281.00 cm/s MV Peak grad:  8.1 mmHg     Estimated RAP:  15.00 mmHg MV Mean grad:  3.0 mmHg     RVSP:           46.6 mmHg MV Vmax:       1.42 m/s MV Vmean:      78.9 cm/s    SHUNTS MV Decel Time: 147 msec     Systemic VTI:  0.13 m MR Peak grad: 28.9 mmHg     Systemic Diam: 2.00 cm MR Vmax:      269.00 cm/s MV E velocity: 116.00 cm/s MV A velocity: 59.40 cm/s MV E/A ratio:  1.95 Wilbert Bihari MD Electronically signed by Wilbert Bihari MD Signature Date/Time: 05/08/2024/3:37:14 PM    Final         Scheduled Meds:  apixaban   5 mg Oral BID   budesonide -glycopyrrolate -formoterol   2 puff Inhalation BID   busPIRone   10 mg Oral BID   Chlorhexidine  Gluconate Cloth  6 each Topical Daily   feeding supplement (GLUCERNA SHAKE)  237 mL Oral BID BM   furosemide   20 mg Oral Daily   guaiFENesin   600 mg Oral BID   iron polysaccharides  150 mg Oral Daily   metoprolol  succinate  25 mg Oral BID   multivitamin with minerals  1 tablet Oral Daily   mupirocin  ointment  1 Application Nasal BID   pantoprazole   40 mg Oral Daily   sodium chloride  flush  3 mL Intravenous Q12H   thiamine  100 mg Oral Daily   triamcinolone   1 spray Nasal Daily   Continuous Infusions:   LOS: 4 days    Time spent: 45 minutes spent on chart review, discussion with nursing staff, consultants, updating family and interview/physical exam; more than 50% of that time was spent in counseling and/or coordination of care.    Harlene RAYMOND Bowl, DO [facility] Available via Epic secure chat 7am-7pm After these hours, please refer to coverage provider listed on [email or web address] 05/10/2024, 1:23 PM

## 2024-05-10 NOTE — Progress Notes (Signed)
 Mobility Specialist Progress Note;   05/10/24 1101  Mobility  Activity Ambulated with assistance  Level of Assistance Contact guard assist, steadying assist  Assistive Device Four wheel walker  Distance Ambulated (ft) 250 ft  Activity Response Tolerated well  Mobility Referral Yes  Mobility visit 1 Mobility  Mobility Specialist Start Time (ACUTE ONLY) 1101  Mobility Specialist Stop Time (ACUTE ONLY) 1130  Mobility Specialist Time Calculation (min) (ACUTE ONLY) 29 min   Pt agreeable to mobility. Required light MinG assistance during ambulation for safety. VSS on RA. Only c/o feeling tired. Deferred sitting in chair at this time d/t wanting to rest. Pt left in bed with all needs met, alarm on.   Lauraine Erm Mobility Specialist Please contact via SecureChat or [employer] [phone]

## 2024-05-11 ENCOUNTER — Encounter: Payer: Self-pay | Admitting: Family Medicine

## 2024-05-11 DIAGNOSIS — J9621 Acute and chronic respiratory failure with hypoxia: Secondary | ICD-10-CM | POA: Diagnosis not present

## 2024-05-11 LAB — BASIC METABOLIC PANEL WITH GFR
Anion gap: 10 (ref 5–15)
BUN: 16 mg/dL (ref 8–23)
CO2: 28 mmol/L (ref 22–32)
Calcium: 8.6 mg/dL — ABNORMAL LOW (ref 8.9–10.3)
Chloride: 99 mmol/L (ref 98–111)
Creatinine, Ser: 0.77 mg/dL (ref 0.61–1.24)
GFR, Estimated: >60 mL/min (ref >60)
Glucose, Bld: 137 mg/dL — ABNORMAL HIGH (ref 70–99)
Potassium: 3.6 mmol/L (ref 3.5–5.1)
Sodium: 137 mmol/L (ref 135–145)

## 2024-05-11 LAB — CBC
HCT: 33.5 % — ABNORMAL LOW (ref 39.0–52.0)
Hemoglobin: 10.5 g/dL — ABNORMAL LOW (ref 13.0–17.0)
MCH: 28.2 pg (ref 26.0–34.0)
MCHC: 31.3 g/dL (ref 30.0–36.0)
MCV: 89.8 fL (ref 80.0–100.0)
Platelets: 609 K/uL — ABNORMAL HIGH (ref 150–400)
RBC: 3.73 MIL/uL — ABNORMAL LOW (ref 4.22–5.81)
RDW: 14.1 % (ref 11.5–15.5)
WBC: 11.9 K/uL — ABNORMAL HIGH (ref 4.0–10.5)
nRBC: 0 % (ref 0.0–0.2)

## 2024-05-11 LAB — BRAIN NATRIURETIC PEPTIDE: B Natriuretic Peptide: 337.8 pg/mL — ABNORMAL HIGH (ref 0.0–100.0)

## 2024-05-11 LAB — GLUCOSE, CAPILLARY: Glucose-Capillary: 107 mg/dL — ABNORMAL HIGH (ref 70–99)

## 2024-05-11 LAB — CULTURE, BLOOD (ROUTINE X 2)
Culture: NO GROWTH
Culture: NO GROWTH
Special Requests: ADEQUATE
Special Requests: ADEQUATE

## 2024-05-11 MED ORDER — FUROSEMIDE 20 MG PO TABS: 20.0000 mg | ORAL_TABLET | ORAL | Status: DC

## 2024-05-11 MED ORDER — POTASSIUM CHLORIDE CRYS ER 20 MEQ PO TBCR
40.0000 meq | EXTENDED_RELEASE_TABLET | Freq: Once | ORAL | Status: AC
  Administered 2024-05-11: 40 meq via ORAL
  Filled 2024-05-11: qty 2

## 2024-05-11 MED ORDER — FUROSEMIDE 20 MG PO TABS
20.0000 mg | ORAL_TABLET | Freq: Every day | ORAL | Status: DC
  Administered 2024-05-11 – 2024-05-17 (×7): 20 mg via ORAL
  Filled 2024-05-11 (×7): qty 1

## 2024-05-11 NOTE — Progress Notes (Signed)
 Mobility Specialist Progress Note;   05/11/24 1114  Mobility  Activity Ambulated with assistance  Level of Assistance Contact guard assist, steadying assist  Assistive Device Four wheel walker  Distance Ambulated (ft) 150 ft  Activity Response Tolerated well  Mobility Referral Yes  Mobility visit 1 Mobility  Mobility Specialist Start Time (ACUTE ONLY) 1114  Mobility Specialist Stop Time (ACUTE ONLY) 1131  Mobility Specialist Time Calculation (min) (ACUTE ONLY) 17 min   Pt agreeable to mobility. Required MinG assistance during ambulation for safety. VSS on RA when able to receive accurate pleth during ambulation. No c/o when asked. Pt returned back to bed and left with all needs met, alarm on.   Lauraine Erm Mobility Specialist Please contact via SecureChat or [employer] [phone]

## 2024-05-11 NOTE — Plan of Care (Signed)
   Problem: Health Behavior/Discharge Planning: Goal: Ability to manage health-related needs will improve Outcome: Progressing   Problem: Activity: Goal: Risk for activity intolerance will decrease Outcome: Progressing   Problem: Nutrition: Goal: Adequate nutrition will be maintained Outcome: Progressing   Problem: Coping: Goal: Level of anxiety will decrease Outcome: Progressing

## 2024-05-11 NOTE — Progress Notes (Signed)
 PROGRESS NOTE    Matthew Hall  FMW:[id number] DOB: [birth year] DOA: 05/06/2024 PCP: Candise [name], MD    Brief Narrative:  78 y.o. 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 D/C held as family out of town on vacation   Assessment and Plan: Suspected acute exacerbation of diastolic heart failure Chronic hypoxic respiratory failure - 2L at night --pulm consulted considering complex underlying lung disease -Not convinced that this is a COPD exacerbation, certainly not bronchiectasis exacerbation and he does not have CAP so doubt need for antibiotics - He has improved with diuresis, has mildly elevated BNP previous echo showed EF 40 to 45% and small effusions noted on imaging, decompensated heart failure possible -- CT chest done-no new lung consolidations, trace pleural effusions -- stop abx as per recs from pccm as procalcitonin negative -- Hold off on steroids as no wheezing  -- Received diuresis at OSH with concern for HF-have given IV Lasix  x 2 in total with close to 4 L of diuresis and improvement in breathing- will give Lasix  daily (will need to have close outpatient follow-up to make sure we are not over diuresing)-- now down 5.7L -Weights not accurate as they are not standing.  -echo: EF 50 to 55%-improved from prior-- needs outpatient follow up -- ESR, CRP-done and elevated but nonspecific- -- ANA, ANCA, C3/4 with his prior hx vasculitis are negative -- Continue home Breztri . saline neb BID, Duonebs (sub alb for  xopenex ) q6 hr, IS, guaifenisen, OOB to chair during the day  -- wean O2 to off during the day-- only wears at night   Mild elevation of white blood cell count - No fever - Smear normal - Family concerned about malignancies-can place outpatient referral to hematology/oncology for further workup - White blood cell count has been elevated for years on and off   Elevated troponin Minimal elevation just above reference limit < 15 -> 21 hs troponin. EKG is limited by artifact but no overt ischemic changes. And he has no component of chest pain. Suspect demand in setting of his acute respiratory process above.  -- Outpatient follow-up with cardiology   Recent dental extraction  -- No signs of complication on exam, well healed.  -Patient denies any mouth pain or discomfort -Eating well-75 to 100% thus far today of meals plus patient states he had 3 protein shakes yesterday   Deconditioning  -- PT evaluation -walked 300 feet on room air with PT   Hypokalemia/hypomagnesemia/hypophosphatemia -repleted   Anemia-iron deficiency -suspect related to poor po intake/blood loss from recent dental extractions -hgb stable but lower than baseline -PCP follow up - Iron p.o. for now   Nutrition Status: Nutrition Problem: Severe Malnutrition Etiology: social / environmental circumstances Signs/Symptoms: percent weight loss, severe muscle depletion, moderate fat depletion Percent weight loss: 22 % (in one year) Interventions: Glucerna shake, MVI   DVT prophylaxis:  apixaban  (ELIQUIS ) tablet 5 mg    Code Status: Full Code Family Communication: See previous message about calling daughter  Disposition Plan:  Level of care: Med-Surg Status is: Inpatient     Consultants:  Pulmonary  Subjective: Tired  this morning and would like to have a several hour nap but says his breathing is better and back to his baseline Says mouth is sore but he continues to eat well   Objective: Vitals:    05/11/24 0737 05/11/24 0908 05/11/24 0918 05/11/24 1105  BP: 102/77  130/72 (!) 120/57  Pulse: 92 77 88 81  Resp: (!) 23 20  (!) 25  Temp: 98.7 F (37.1 C)   98.3 F (36.8 C)  TempSrc: Oral   Axillary  SpO2: 91% 95%  94%  Weight:      Height:        Intake/Output Summary (Last 24 hours) at 05/11/2024 1339 Last data filed at 05/11/2024 1200 Gross per 24 hour  Intake 476 ml  Output 1750 ml  Net -1274 ml   Filed Weights   05/09/24 0320 05/10/24 0700 05/11/24 0559  Weight: 62.8 kg 60.3 kg 62.3 kg    Examination:   General: Appearance:    Thin male in no acute distress   Able to speak in complete sentences with no issue  Lungs:    On room air, respirations unlabored-no wheezing  Heart:    Normal heart rate.    MS:   All extremities are intact.    Neurologic:   Awake, alert, pleasant cooperative       Data Reviewed: I have personally reviewed following labs and imaging studies  CBC: Recent Labs  Lab 05/08/24 1143 05/09/24 1352 05/10/24 0214 05/10/24 1244 05/11/24 0152  WBC 11.3* 12.4* 11.6* 12.9* 11.9*  NEUTROABS  --   --   --  10.0*  --   HGB 10.4* 10.7* 10.1* 10.0* 10.5*  HCT 33.0* 34.0* 31.9* 32.1* 33.5*  MCV 90.4 89.9 88.4 90.7 89.8  PLT 549* 625* 587* 579* 609*   Basic Metabolic Panel: Recent Labs  Lab 05/06/24 1831 05/07/24 0303 05/09/24 0235 05/10/24 0214 05/11/24 0152  NA 134* 132* 135 135 137  K 4.3 3.2* 3.5 3.8 3.6  CL 96* 90* 96* 98 99  CO2 28 29 27 28 28   GLUCOSE 100* 159* 135* 141* 137*  BUN 14 16 16 16 16   CREATININE 0.82 0.80 0.84 0.87 0.77  CALCIUM  9.2 8.9 8.4* 8.2* 8.6*  MG  --  1.6* 2.0  --   --   PHOS  --  2.2* 2.8  --   --    GFR: Estimated Creatinine Clearance: 64.9 mL/min (by C-G formula based on SCr of 0.77 mg/dL). Liver Function Tests: Recent Labs  Lab 05/06/24 1831  AST 27  ALT 23  ALKPHOS 140*  BILITOT 0.6  PROT 7.2  ALBUMIN 3.2*   No results for input(s): LIPASE, AMYLASE in the last 168 hours. No results  for input(s): AMMONIA in the last 168 hours. Coagulation Profile: Recent Labs  Lab 05/06/24 1831  INR 1.6*   Cardiac Enzymes: No results for input(s): CKTOTAL, CKMB, CKMBINDEX, TROPONINI in the last 168 hours.   HbA1C: No results for input(s): HGBA1C in the last 72 hours. CBG: Recent Labs  Lab 05/06/24 2232 05/08/24 0802 05/09/24 0521 05/10/24 0612 05/11/24 0543  GLUCAP 110* 147* 115* 108* 107*   Lipid Profile: No results for input(s): CHOL, HDL, LDLCALC, TRIG, CHOLHDL, LDLDIRECT in the last 72 hours. Thyroid  Function Tests: No results for input(s): TSH, T4TOTAL, FREET4, T3FREE, THYROIDAB in the last 72 hours. Anemia Panel: Recent Labs    05/09/24 1849  FERRITIN 130  TIBC 262  IRON 18*   Sepsis Labs: Recent Labs  Lab 05/06/24 1831 05/07/24 0303  PROCALCITON  --  <0.10  LATICACIDVEN 1.2  --     Recent Results (from the past 240 hours)  Resp panel by RT-PCR (RSV, Flu A&B, Covid) Anterior Nasal Swab     Status: None   Collection Time: 05/06/24  6:16 PM   Specimen: Anterior Nasal Swab  Result Value Ref Range Status   SARS Coronavirus 2 by RT PCR NEGATIVE NEGATIVE Final    Comment: (NOTE) SARS-CoV-2 target nucleic acids are NOT DETECTED.  The SARS-CoV-2 RNA is generally detectable in upper respiratory specimens during the acute phase of infection. The lowest concentration of SARS-CoV-2 viral copies this assay can detect is 138 copies/mL. A negative result does not preclude SARS-Cov-2 infection and should not be used as the sole basis for treatment or other patient management decisions. A negative result may occur with  improper specimen collection/handling, submission of specimen other than nasopharyngeal swab, presence of viral mutation(s) within the areas targeted by this assay, and inadequate number of viral copies(<138 copies/mL). A negative result must be combined with clinical observations, patient history, and  epidemiological information. The expected result is Negative.  Fact Sheet for Patients:  bloggercourse.com  Fact Sheet for Healthcare Providers:  seriousbroker.it  This test is no t yet approved or cleared by the [place]  FDA and  has been authorized for detection and/or diagnosis of SARS-CoV-2 by FDA under an Emergency Use Authorization (EUA). This EUA will remain  in effect (meaning this test can be used) for the duration of the COVID-19 declaration under Section 564(b)(1) of the Act, 21 U.S.C.section 360bbb-3(b)(1), unless the authorization is terminated  or revoked sooner.       Influenza A by PCR NEGATIVE NEGATIVE Final   Influenza B by PCR NEGATIVE NEGATIVE Final    Comment: (NOTE) The Xpert Xpress SARS-CoV-2/FLU/RSV plus assay is intended as an aid in the diagnosis of influenza from Nasopharyngeal swab specimens and should not be used as a sole basis for treatment. Nasal washings and aspirates are unacceptable for Xpert Xpress SARS-CoV-2/FLU/RSV testing.  Fact Sheet for Patients: bloggercourse.com  Fact Sheet for Healthcare Providers: seriousbroker.it  This test is not yet approved or cleared by the [place]  FDA and has been authorized for detection and/or diagnosis of SARS-CoV-2 by FDA under an Emergency Use Authorization (EUA). This EUA will remain in effect (meaning this test can be used) for the duration of the COVID-19 declaration under Section 564(b)(1) of the Act, 21 U.S.C. section 360bbb-3(b)(1), unless the authorization is terminated or revoked.     Resp Syncytial Virus by PCR NEGATIVE NEGATIVE Final    Comment: (NOTE) Fact Sheet for Patients: bloggercourse.com  Fact Sheet for Healthcare Providers: seriousbroker.it  This test is not yet approved or cleared by the [place]  FDA and has been  authorized for detection and/or diagnosis of SARS-CoV-2 by FDA under an Emergency Use Authorization (EUA). This EUA will remain in effect (meaning this test can be used) for the duration of the COVID-19 declaration under Section 564(b)(1) of the Act, 21 U.S.C. section 360bbb-3(b)(1), unless the authorization is terminated or revoked.  Performed at [facility], [address], [place], [place] [address]   Blood Culture (routine x 2)     Status: None   Collection Time: 05/06/24  6:31 PM   Specimen: BLOOD  Result Value Ref Range Status   Specimen Description   Final    BLOOD LEFT ANTECUBITAL Performed at [facility], [address] [place],  [place] [address]    Special Requests   Final    Blood Culture adequate volume BOTTLES DRAWN AEROBIC AND ANAEROBIC Performed at [facility], [address] [place], [place] [address]    Culture   Final    NO GROWTH 5 DAYS Performed at [facility], [address], [place], [place] [address]    Report Status 05/11/2024 FINAL  Final  Blood Culture (routine x 2)     Status: None   Collection Time: 05/06/24  7:15 PM   Specimen: BLOOD  Result Value Ref Range Status   Specimen Description   Final    BLOOD RIGHT ANTECUBITAL Performed at [facility], [address], [place], [place] [address]    Special Requests   Final    Blood Culture adequate volume BOTTLES DRAWN AEROBIC AND ANAEROBIC Performed at [facility], [address] [place], [place] [address]    Culture   Final    NO GROWTH 5 DAYS Performed at [facility], [address] [place], [place] [address]    Report Status 05/11/2024 FINAL  Final  MRSA Next Gen by PCR, Nasal     Status: Abnormal   Collection Time: 05/06/24 10:41 PM   Specimen: Nasal Mucosa; Nasal Swab  Result Value Ref Range Status   MRSA by PCR Next Gen DETECTED (A) NOT DETECTED Final    Comment:  RESULTS CALLED TO READ BACK BY AND VERIFED WITH RN M.LAYTON ON 05/07/24 AT 0021 BY NM (NOTE) The GeneXpert MRSA Assay (FDA approved for NASAL specimens only), is one component of a comprehensive MRSA colonization surveillance program. It is not intended to diagnose MRSA infection nor to guide or monitor treatment for MRSA infections. Test performance is not FDA approved in patients less than 76 years old. Performed at [facility], [address], [place], [place] [address]   Respiratory (~20 pathogens) panel by PCR     Status: None   Collection Time: 05/06/24 11:35 PM   Specimen: Nasopharyngeal Swab; Respiratory  Result Value Ref Range Status   Adenovirus NOT DETECTED NOT DETECTED Final   Coronavirus 229E NOT DETECTED NOT DETECTED Final    Comment: (NOTE) The Coronavirus on the Respiratory Panel, DOES NOT test for the novel  Coronavirus (2019 nCoV)    Coronavirus HKU1 NOT DETECTED NOT DETECTED Final   Coronavirus NL63 NOT DETECTED NOT DETECTED Final   Coronavirus OC43 NOT DETECTED NOT DETECTED Final   Metapneumovirus NOT DETECTED NOT DETECTED Final   Rhinovirus / Enterovirus NOT DETECTED NOT DETECTED Final   Influenza A NOT DETECTED NOT DETECTED Final   Influenza B NOT DETECTED NOT DETECTED Final   Parainfluenza Virus 1 NOT DETECTED NOT DETECTED Final   Parainfluenza Virus 2 NOT DETECTED NOT DETECTED Final   Parainfluenza Virus 3 NOT DETECTED NOT DETECTED Final   Parainfluenza Virus 4 NOT DETECTED NOT DETECTED Final   Respiratory Syncytial Virus NOT DETECTED NOT DETECTED Final   Bordetella pertussis NOT DETECTED NOT DETECTED Final   Bordetella Parapertussis NOT DETECTED NOT DETECTED Final   Chlamydophila pneumoniae NOT DETECTED NOT DETECTED Final   Mycoplasma pneumoniae NOT DETECTED NOT DETECTED Final    Comment: Performed at [facility], [address] [place], [place] [address]         Radiology Studies: No results found.       Scheduled Meds:   apixaban   5 mg Oral BID   budesonide -glycopyrrolate -formoterol   2 puff Inhalation BID  busPIRone   10 mg Oral BID   feeding supplement (GLUCERNA SHAKE)  237 mL Oral BID BM   furosemide   20 mg Oral Daily   guaiFENesin   600 mg Oral BID   iron polysaccharides  150 mg Oral Daily   metoprolol  succinate  25 mg Oral BID   multivitamin with minerals  1 tablet Oral Daily   pantoprazole   40 mg Oral Daily   sodium chloride  flush  3 mL Intravenous Q12H   thiamine  100 mg Oral Daily   triamcinolone   1 spray Nasal Daily   Continuous Infusions:   LOS: 5 days    Time spent: 45 minutes spent on chart review, discussion with nursing staff, consultants, updating family and interview/physical exam; more than 50% of that time was spent in counseling and/or coordination of care.    Harlene RAYMOND Bowl, DO [facility] Available via Epic secure chat 7am-7pm After these hours, please refer to coverage provider listed on [email or web address] 05/11/2024, 1:39 PM

## 2024-05-12 ENCOUNTER — Inpatient Hospital Stay (HOSPITAL_COMMUNITY)

## 2024-05-12 ENCOUNTER — Other Ambulatory Visit: Payer: Self-pay

## 2024-05-12 DIAGNOSIS — J9621 Acute and chronic respiratory failure with hypoxia: Secondary | ICD-10-CM | POA: Diagnosis not present

## 2024-05-12 LAB — RESPIRATORY PANEL BY PCR

## 2024-05-12 LAB — URINALYSIS, ROUTINE W REFLEX MICROSCOPIC
Bilirubin Urine: NEGATIVE
Glucose, UA: NEGATIVE mg/dL
Hgb urine dipstick: NEGATIVE
Ketones, ur: NEGATIVE mg/dL
Leukocytes,Ua: NEGATIVE
Nitrite: NEGATIVE
Protein, ur: 30 mg/dL — AB
Specific Gravity, Urine: 1.012 (ref 1.005–1.030)
pH: 6 (ref 5.0–8.0)

## 2024-05-12 LAB — EXPECTORATED SPUTUM ASSESSMENT W GRAM STAIN, RFLX TO RESP C: Special Requests: NORMAL

## 2024-05-12 LAB — LACTIC ACID, PLASMA: Lactic Acid, Venous: 1.3 mmol/L (ref 0.5–1.9)

## 2024-05-12 LAB — BASIC METABOLIC PANEL WITH GFR
Anion gap: 9 (ref 5–15)
BUN: 19 mg/dL (ref 8–23)
CO2: 26 mmol/L (ref 22–32)
Calcium: 8.3 mg/dL — ABNORMAL LOW (ref 8.9–10.3)
Chloride: 99 mmol/L (ref 98–111)
Creatinine, Ser: 0.83 mg/dL (ref 0.61–1.24)
GFR, Estimated: >60 mL/min (ref >60)
Glucose, Bld: 159 mg/dL — ABNORMAL HIGH (ref 70–99)
Potassium: 4.1 mmol/L (ref 3.5–5.1)
Sodium: 134 mmol/L — ABNORMAL LOW (ref 135–145)

## 2024-05-12 LAB — CBC
HCT: 33.2 % — ABNORMAL LOW (ref 39.0–52.0)
Hemoglobin: 10.3 g/dL — ABNORMAL LOW (ref 13.0–17.0)
MCH: 27.8 pg (ref 26.0–34.0)
MCHC: 31 g/dL (ref 30.0–36.0)
MCV: 89.7 fL (ref 80.0–100.0)
Platelets: 641 K/uL — ABNORMAL HIGH (ref 150–400)
RBC: 3.7 MIL/uL — ABNORMAL LOW (ref 4.22–5.81)
RDW: 14.1 % (ref 11.5–15.5)
WBC: 15 K/uL — ABNORMAL HIGH (ref 4.0–10.5)
nRBC: 0 % (ref 0.0–0.2)

## 2024-05-12 LAB — RESP PANEL BY RT-PCR (RSV, FLU A&B, COVID)  RVPGX2
Influenza A by PCR: NEGATIVE
Influenza B by PCR: NEGATIVE
Resp Syncytial Virus by PCR: NEGATIVE
SARS Coronavirus 2 by RT PCR: NEGATIVE

## 2024-05-12 LAB — GLUCOSE, CAPILLARY: Glucose-Capillary: 95 mg/dL (ref 70–99)

## 2024-05-12 MED ORDER — DEXTROSE 5 % IV SOLN
250.0000 mg | INTRAVENOUS | Status: DC
  Administered 2024-05-12: 250 mg via INTRAVENOUS
  Filled 2024-05-12 (×2): qty 2.5

## 2024-05-12 MED ORDER — HYDRALAZINE HCL 20 MG/ML IJ SOLN
5.0000 mg | Freq: Four times a day (QID) | INTRAMUSCULAR | Status: DC | PRN
  Administered 2024-05-12: 5 mg via INTRAVENOUS
  Filled 2024-05-12: qty 1

## 2024-05-12 MED ORDER — SODIUM CHLORIDE 0.9 % IV SOLN
1.0000 g | INTRAVENOUS | Status: DC
  Administered 2024-05-12: 1 g via INTRAVENOUS
  Filled 2024-05-12: qty 10

## 2024-05-12 NOTE — Progress Notes (Signed)
 Mobility Specialist Progress Note;   05/12/24 1308  Mobility  Activity Ambulated with assistance;Pivoted/transferred from bed to chair  Level of Assistance Contact guard assist, steadying assist  Assistive Device Four wheel walker  Distance Ambulated (ft) 150 ft  Activity Response Tolerated well  Mobility Referral Yes  Mobility visit 1 Mobility  Mobility Specialist Start Time (ACUTE ONLY) 1308  Mobility Specialist Stop Time (ACUTE ONLY) 1335  Mobility Specialist Time Calculation (min) (ACUTE ONLY) 27 min    Pre-mobility: SPO2 90% RA During-mobility: SPO2 88% RA; SPO2 92%> 1LO2 Post-mobility: SPO2 93% 1LO2  Pt agreeable to mobility. Required MinG assistance to safely ambulate in hallway. Pt displaying increased WOB, applied 1LO2 to pt for ambulation and to maintain sats. No other c/o when asked. Agreeable to sit up in chair at [facility]. Pt left in chair with all needs met, alarm on. RN notified.   Lauraine Erm Mobility Specialist Please contact via SecureChat or [employer] [phone]

## 2024-05-12 NOTE — TOC Progression Note (Addendum)
 Transition of Care [facility]) - Progression Note    Patient Details  Name: Matthew Hall MRN: [id number] Date of Birth: 11/01/1945  [facility] CM/SW Contact  Roxie KANDICE Stain, RN Phone Number: 05/12/2024, 12:45 PM  Clinical Narrative:     12:45 Patient will need home health PT, OT , SLP orders. [facility] will provide these services.  Fax home health orders and discharge summary to [phone].  [phone] home health orders faxed. Will need to fax discharge summary at time of dc.  Expected Discharge Plan: Home/Self Care Barriers to Discharge: Continued Medical Work up               Expected Discharge Plan and Services   Discharge Planning Services: CM Consult   Living arrangements for the past 2 months: Independent Living Facility Expected Discharge Date: 05/09/24                                     Social Drivers of Health (SDOH) Interventions SDOH Screenings   Food Insecurity: No Food Insecurity (05/06/2024)  Housing: Low Risk  (05/06/2024)  Transportation Needs: No Transportation Needs (05/06/2024)  Utilities: Not At Risk (05/06/2024)  Depression (PHQ2-9): Low Risk  (03/19/2024)  Social Connections: Moderately Isolated (05/06/2024)  Tobacco Use: Medium Risk (05/07/2024)    Readmission Risk Interventions    07/15/2023    2:01 PM 06/22/2023   11:49 AM  Readmission Risk Prevention Plan  Transportation Screening Complete Complete  Home Care Screening  Complete  Medication Review (RN CM)  Complete  Medication Review (RN Care Manager) Complete   PCP or Specialist appointment within 3-5 days of discharge Complete   HRI or Home Care Consult Complete   SW Recovery Care/Counseling Consult Complete   Palliative Care Screening Not Applicable   Skilled Nursing Facility Not Applicable

## 2024-05-12 NOTE — Progress Notes (Signed)
 PROGRESS NOTE    Matthew Hall  FMW:[id number] DOB: 08-Nov-1945 DOA: 05/06/2024 PCP: Candise [name], MD    Brief Narrative:  78 y.o. 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  Seen by pulmonology and they agree it is not pneumonia/bronchiectasis D/C held as family out of town on vacation but overnight on 11/3 patient's white blood count jumped from 11-15 and had a temperature of 99.6.  Patient denies dysuria, denies cough, denies feeling unwell.  Called daughter discussed but will plan to monitor overnight again and check chest x-ray and urinalysis.  If continues may need CT scan of jaw to rule out smoldering infection after 5 teeth removed   Assessment and Plan: Suspected acute exacerbation of diastolic heart failure Chronic hypoxic respiratory failure - 2L at night --pulm consulted considering complex underlying lung disease -Not convinced that this is a COPD exacerbation, certainly not bronchiectasis exacerbation and he does not have CAP so doubt need for antibiotics - He has improved with diuresis, has mildly elevated BNP previous echo showed EF 40 to 45% and small effusions noted on imaging, decompensated heart failure possible -- CT chest done-no new lung consolidations, trace pleural effusions -- stop abx as per recs from pccm as procalcitonin negative -- Hold off on steroids as no wheezing  -- Received diuresis at OSH with concern for HF-have given IV Lasix  x 2 in total with close to 4 L of diuresis and improvement in breathing- will give  Lasix  daily (will need to have close outpatient follow-up to make sure we are not over diuresing)-- now down 6.7L -Weights not accurate as they are not standing.  -echo: EF 50 to 55%-improved from prior-- needs outpatient follow up -- ESR, CRP-done and elevated but nonspecific- -- ANA, ANCA, C3/4 with his prior hx vasculitis are negative -- Continue home Breztri . saline neb BID, Duonebs (sub alb for xopenex ) q6 hr, IS, guaifenisen, OOB to chair during the day  -- wean O2 to off during the day-- only wears at night   Leukocytosis - No true fever but trending up it appears - White blood cell count went from 11-15 overnight - Smear normal - Family concerned about malignancies-can place outpatient referral to hematology/oncology for further workup - White blood cell count has been elevated for years on and off so not sure what a white blood cell count of 15 is indicative of but in light of a temperature higher than his baseline-Will check urinalysis, chest x-ray-patient has absolutely no new complaints   Elevated troponin Minimal elevation just above reference limit < 15 -> 21 hs troponin. EKG is limited by artifact but no overt ischemic changes. And he has no component of chest pain. Suspect demand in setting of his acute respiratory process above.  -- Outpatient follow-up with cardiology   Recent dental extraction  -- No signs of complication on exam, well healed.  -Patient denies any mouth pain or discomfort -Eating well-75 to 100% thus far today of meals plus patient states he had 3 protein shakes yesterday   Deconditioning  -- PT evaluation -walked 300 feet on room air with PT  Hypokalemia/hypomagnesemia/hypophosphatemia -repleted   Anemia-iron deficiency -suspect related to poor po intake/blood loss from recent dental extractions -hgb stable but lower than baseline -PCP follow up - Iron p.o. for now   Nutrition Status: Nutrition Problem: Severe Malnutrition Etiology: social /  environmental circumstances Signs/Symptoms: percent weight loss, severe muscle depletion, moderate fat depletion Percent weight loss: 22 % (in one year) Interventions: Glucerna shake, MVI   DVT prophylaxis:  apixaban  (ELIQUIS ) tablet 5 mg    Code Status: Full Code Family Communication: Left message  Disposition Plan:  Level of care: Med-Surg Status is: Inpatient     Consultants:  Pulmonary  Subjective: Denies worsening cough, denies worsening jaw pain, denies dysuria-says he feels fine   Objective: Vitals:   05/12/24 0345 05/12/24 0754 05/12/24 0800 05/12/24 1219  BP: 118/65 127/67  134/69  Pulse: 85 62  80  Resp: 16 18  20   Temp: 99.6 F (37.6 C) 98.1 F (36.7 C)  97.8 F (36.6 C)  TempSrc: Oral Oral  Oral  SpO2: 94% 91% 96% 90%  Weight: 62.3 kg     Height:        Intake/Output Summary (Last 24 hours) at 05/12/2024 1323 Last data filed at 05/12/2024 0810 Gross per 24 hour  Intake 130 ml  Output 1150 ml  Net -1020 ml   Filed Weights   05/10/24 0700 05/11/24 0559 05/12/24 0345  Weight: 60.3 kg 62.3 kg 62.3 kg    Examination:   General: Appearance:    Thin male in no acute distress   Able to speak in complete sentences with no issue  Lungs:    On room air, respirations unlabored-no wheezing  Heart:    Normal heart rate.    MS:   All extremities are intact.    Neurologic:   Awake, alert, pleasant cooperative       Data Reviewed: I have personally reviewed following labs and imaging studies  CBC: Recent Labs  Lab 05/09/24 1352 05/10/24 0214 05/10/24 1244 05/11/24 0152 05/12/24 0222  WBC 12.4* 11.6* 12.9* 11.9* 15.0*  NEUTROABS  --   --  10.0*  --   --   HGB 10.7* 10.1* 10.0* 10.5* 10.3*  HCT 34.0* 31.9* 32.1* 33.5* 33.2*  MCV 89.9 88.4 90.7 89.8 89.7  PLT 625* 587* 579* 609* 641*   Basic Metabolic Panel: Recent Labs  Lab 05/07/24 0303 05/09/24 0235 05/10/24 0214 05/11/24 0152 05/12/24 0222  NA 132* 135 135 137 134*  K 3.2* 3.5 3.8  3.6 4.1  CL 90* 96* 98 99 99  CO2 29 27 28 28 26   GLUCOSE 159* 135* 141* 137* 159*  BUN 16 16 16 16 19   CREATININE 0.80 0.84 0.87 0.77 0.83  CALCIUM  8.9 8.4* 8.2* 8.6* 8.3*  MG 1.6* 2.0  --   --   --   PHOS 2.2* 2.8  --   --   --    GFR: Estimated Creatinine Clearance: 62.6 mL/min (by C-G formula based on SCr of 0.83 mg/dL). Liver Function Tests: Recent Labs  Lab 05/06/24 1831  AST 27  ALT 23  ALKPHOS 140*  BILITOT 0.6  PROT 7.2  ALBUMIN 3.2*   No results for input(s): LIPASE, AMYLASE in the last 168 hours. No results for input(s): AMMONIA in the last 168 hours. Coagulation Profile: Recent Labs  Lab 05/06/24 1831  INR 1.6*   Cardiac Enzymes: No results for input(s): CKTOTAL, CKMB, CKMBINDEX, TROPONINI in the last 168 hours.   HbA1C: No results for input(s): HGBA1C  in the last 72 hours. CBG: Recent Labs  Lab 05/08/24 0802 05/09/24 0521 05/10/24 0612 05/11/24 0543 05/12/24 0612  GLUCAP 147* 115* 108* 107* 95   Lipid Profile: No results for input(s): CHOL, HDL, LDLCALC, TRIG, CHOLHDL, LDLDIRECT in the last 72 hours. Thyroid  Function Tests: No results for input(s): TSH, T4TOTAL, FREET4, T3FREE, THYROIDAB in the last 72 hours. Anemia Panel: Recent Labs    05/09/24 1849  FERRITIN 130  TIBC 262  IRON 18*   Sepsis Labs: Recent Labs  Lab 05/06/24 1831 05/07/24 0303  PROCALCITON  --  <0.10  LATICACIDVEN 1.2  --     Recent Results (from the past 240 hours)  Resp panel by RT-PCR (RSV, Flu A&B, Covid) Anterior Nasal Swab     Status: None   Collection Time: 05/06/24  6:16 PM   Specimen: Anterior Nasal Swab  Result Value Ref Range Status   SARS Coronavirus 2 by RT PCR NEGATIVE NEGATIVE Final    Comment: (NOTE) SARS-CoV-2 target nucleic acids are NOT DETECTED.  The SARS-CoV-2 RNA is generally detectable in upper respiratory specimens during the acute phase of infection. The lowest concentration of SARS-CoV-2 viral  copies this assay can detect is 138 copies/mL. A negative result does not preclude SARS-Cov-2 infection and should not be used as the sole basis for treatment or other patient management decisions. A negative result may occur with  improper specimen collection/handling, submission of specimen other than nasopharyngeal swab, presence of viral mutation(s) within the areas targeted by this assay, and inadequate number of viral copies(<138 copies/mL). A negative result must be combined with clinical observations, patient history, and epidemiological information. The expected result is Negative.  Fact Sheet for Patients:  bloggercourse.com  Fact Sheet for Healthcare Providers:  seriousbroker.it  This test is no t yet approved or cleared by the [place]  FDA and  has been authorized for detection and/or diagnosis of SARS-CoV-2 by FDA under an Emergency Use Authorization (EUA). This EUA will remain  in effect (meaning this test can be used) for the duration of the COVID-19 declaration under Section 564(b)(1) of the Act, 21 U.S.C.section 360bbb-3(b)(1), unless the authorization is terminated  or revoked sooner.       Influenza A by PCR NEGATIVE NEGATIVE Final   Influenza B by PCR NEGATIVE NEGATIVE Final    Comment: (NOTE) The Xpert Xpress SARS-CoV-2/FLU/RSV plus assay is intended as an aid in the diagnosis of influenza from Nasopharyngeal swab specimens and should not be used as a sole basis for treatment. Nasal washings and aspirates are unacceptable for Xpert Xpress SARS-CoV-2/FLU/RSV testing.  Fact Sheet for Patients: bloggercourse.com  Fact Sheet for Healthcare Providers: seriousbroker.it  This test is not yet approved or cleared by the [place]  FDA and has been authorized for detection and/or diagnosis of SARS-CoV-2 by FDA under an Emergency Use Authorization (EUA). This  EUA will remain in effect (meaning this test can be used) for the duration of the COVID-19 declaration under Section 564(b)(1) of the Act, 21 U.S.C. section 360bbb-3(b)(1), unless the authorization is terminated or revoked.     Resp Syncytial Virus by PCR NEGATIVE NEGATIVE Final    Comment: (NOTE) Fact Sheet for Patients: bloggercourse.com  Fact Sheet for Healthcare Providers: seriousbroker.it  This test is not yet approved or cleared by the [place]  FDA and has been authorized for detection and/or diagnosis of SARS-CoV-2 by FDA under an Emergency Use Authorization (EUA). This EUA will remain in effect (meaning this test can be used) for the duration  of the COVID-19 declaration under Section 564(b)(1) of the Act, 21 U.S.C. section 360bbb-3(b)(1), unless the authorization is terminated or revoked.  Performed at [facility], [address] [place], [place] [address]   Blood Culture (routine x 2)     Status: None   Collection Time: 05/06/24  6:31 PM   Specimen: BLOOD  Result Value Ref Range Status   Specimen Description   Final    BLOOD LEFT ANTECUBITAL Performed at [facility], [address], [place], [place] [address]    Special Requests   Final    Blood Culture adequate volume BOTTLES DRAWN AEROBIC AND ANAEROBIC Performed at [facility], [address], [place], [place] [address]    Culture   Final    NO GROWTH 5 DAYS Performed at [facility], [address] [place], [place] [address]    Report Status 05/11/2024 FINAL  Final  Blood Culture (routine x 2)     Status: None   Collection Time: 05/06/24  7:15 PM   Specimen: BLOOD  Result Value Ref Range Status   Specimen Description   Final    BLOOD RIGHT ANTECUBITAL Performed at [facility], [address] [place], [place] [address]    Special Requests   Final    Blood  Culture adequate volume BOTTLES DRAWN AEROBIC AND ANAEROBIC Performed at [facility], [address], [place], [place] [address]    Culture   Final    NO GROWTH 5 DAYS Performed at [facility], [address], [place], [place] [address]    Report Status 05/11/2024 FINAL  Final  MRSA Next Gen by PCR, Nasal     Status: Abnormal   Collection Time: 05/06/24 10:41 PM   Specimen: Nasal Mucosa; Nasal Swab  Result Value Ref Range Status   MRSA by PCR Next Gen DETECTED (A) NOT DETECTED Final    Comment: RESULTS CALLED TO READ BACK BY AND VERIFED WITH RN M.LAYTON ON 05/07/24 AT 0021 BY NM (NOTE) The GeneXpert MRSA Assay (FDA approved for NASAL specimens only), is one component of a comprehensive MRSA colonization surveillance program. It is not intended to diagnose MRSA infection nor to guide or monitor treatment for MRSA infections. Test performance is not FDA approved in patients less than 36 years old. Performed at [facility], [address] [place], [place] [address]   Respiratory (~20 pathogens) panel by PCR     Status: None   Collection Time: 05/06/24 11:35 PM   Specimen: Nasopharyngeal Swab; Respiratory  Result Value Ref Range Status   Adenovirus NOT DETECTED NOT DETECTED Final   Coronavirus 229E NOT DETECTED NOT DETECTED Final    Comment: (NOTE) The Coronavirus on the Respiratory Panel, DOES NOT test for the novel  Coronavirus (2019 nCoV)    Coronavirus HKU1 NOT DETECTED NOT DETECTED Final   Coronavirus NL63 NOT DETECTED NOT DETECTED Final   Coronavirus OC43 NOT DETECTED NOT DETECTED Final   Metapneumovirus NOT DETECTED NOT DETECTED Final   Rhinovirus / Enterovirus NOT DETECTED NOT DETECTED Final   Influenza A NOT DETECTED NOT DETECTED Final   Influenza B NOT DETECTED NOT DETECTED Final   Parainfluenza Virus 1 NOT DETECTED NOT DETECTED Final   Parainfluenza Virus 2 NOT DETECTED NOT DETECTED Final   Parainfluenza Virus 3 NOT DETECTED NOT  DETECTED Final   Parainfluenza Virus 4 NOT DETECTED NOT DETECTED Final   Respiratory Syncytial Virus NOT DETECTED NOT DETECTED Final  Bordetella pertussis NOT DETECTED NOT DETECTED Final   Bordetella Parapertussis NOT DETECTED NOT DETECTED Final   Chlamydophila pneumoniae NOT DETECTED NOT DETECTED Final   Mycoplasma pneumoniae NOT DETECTED NOT DETECTED Final    Comment: Performed at [facility], [address] [place], [place] [address]         Radiology Studies: No results found.       Scheduled Meds:  apixaban   5 mg Oral BID   budesonide -glycopyrrolate -formoterol   2 puff Inhalation BID   busPIRone   10 mg Oral BID   feeding supplement (GLUCERNA SHAKE)  237 mL Oral BID BM   furosemide   20 mg Oral Daily   guaiFENesin   600 mg Oral BID   iron polysaccharides  150 mg Oral Daily   metoprolol  succinate  25 mg Oral BID   multivitamin with minerals  1 tablet Oral Daily   pantoprazole   40 mg Oral Daily   sodium chloride  flush  3 mL Intravenous Q12H   thiamine  100 mg Oral Daily   triamcinolone   1 spray Nasal Daily   Continuous Infusions:   LOS: 6 days    Time spent: 45 minutes spent on chart review, discussion with nursing staff, consultants, updating family and interview/physical exam; more than 50% of that time was spent in counseling and/or coordination of care.    Harlene RAYMOND Bowl, DO [facility] Available via Epic secure chat 7am-7pm After these hours, please refer to coverage provider listed on [email or web address] 05/12/2024, 1:23 PM

## 2024-05-12 NOTE — Progress Notes (Addendum)
 Patient with temperature >100.5.  Sent blood cultures and start abx.  Sputum culture sent as well.  Unclear source as x ray and urine are not suggestive of infection.  Recent dental infection-- will get CT scan of jaw.   -monitor for rashes/lesions ID consult in AM Harlene Bowl DO

## 2024-05-12 NOTE — Progress Notes (Signed)
 Called daughter- LM. JV

## 2024-05-12 NOTE — Significant Event (Signed)
 Rapid Response Event Note   Reason for Call :  Tachycardia, tachypnea, anxiety/restlessness VS: BP 176/90, HR 128, RR 34  Asked RN to check a rectal temperature, as oral temperature was 99.5F. Rectal temperature 103.59F.   Initial Focused Assessment:  Pt lying in bed, AO. Breathing is labored. Cough is congested. Pt is coughing up tan, thick sputum. Skin is warm, dry, tenting. Patient became diaphoretic as fever began breaking. Clear breath sounds- after coughing and clearing secretions. Abdomen is soft. Faint bowel sounds.    Interventions:  -PRN Tylenol   & ice packs for fever -Oral suction set up at bedside.  -Rectal temp rechecked 1 hour after PO Tylenol : 102.47F  Plan of Care:  -New orders from MD -Frequent VS check- recheck temperature q1h until fever subsides  Event Summary:  MD Notified: Dr. Juvenal Call Time: 1652 Arrival Time: 1705 End Time: 1805  Leonor LITTIE Danker, RN

## 2024-05-12 NOTE — Plan of Care (Signed)
  Problem: Education: Goal: Knowledge of General Education information will improve Description: Including pain rating scale, medication(s)/side effects and non-pharmacologic comfort measures Outcome: Progressing   Problem: Health Behavior/Discharge Planning: Goal: Ability to manage health-related needs will improve Outcome: Progressing   Problem: Clinical Measurements: Goal: Ability to maintain clinical measurements within normal limits will improve Outcome: Progressing Goal: Will remain free from infection Outcome: Progressing Goal: Diagnostic test results will improve Outcome: Progressing Goal: Respiratory complications will improve Outcome: Progressing Goal: Cardiovascular complication will be avoided Outcome: Progressing   Problem: Activity: Goal: Risk for activity intolerance will decrease Outcome: Progressing   Problem: Nutrition: Goal: Adequate nutrition will be maintained Outcome: Progressing   Problem: Coping: Goal: Level of anxiety will decrease Outcome: Progressing   Problem: Elimination: Goal: Will not experience complications related to bowel motility Outcome: Progressing Goal: Will not experience complications related to urinary retention Outcome: Progressing   Problem: Pain Managment: Goal: General experience of comfort will improve and/or be controlled Outcome: Progressing   Problem: Safety: Goal: Ability to remain free from injury will improve Outcome: Progressing   Problem: Skin Integrity: Goal: Risk for impaired skin integrity will decrease Outcome: Progressing   Problem: Education: Goal: Ability to demonstrate management of disease process will improve Outcome: Progressing Goal: Ability to verbalize understanding of medication therapies will improve Outcome: Progressing Goal: Individualized Educational Video(s) Outcome: Progressing   Problem: Activity: Goal: Capacity to carry out activities will improve Outcome: Progressing

## 2024-05-13 DIAGNOSIS — J9610 Chronic respiratory failure, unspecified whether with hypoxia or hypercapnia: Secondary | ICD-10-CM

## 2024-05-13 DIAGNOSIS — R634 Abnormal weight loss: Secondary | ICD-10-CM

## 2024-05-13 DIAGNOSIS — J479 Bronchiectasis, uncomplicated: Secondary | ICD-10-CM | POA: Diagnosis not present

## 2024-05-13 DIAGNOSIS — D72829 Elevated white blood cell count, unspecified: Secondary | ICD-10-CM

## 2024-05-13 DIAGNOSIS — R509 Fever, unspecified: Secondary | ICD-10-CM

## 2024-05-13 DIAGNOSIS — B370 Candidal stomatitis: Secondary | ICD-10-CM

## 2024-05-13 DIAGNOSIS — J9621 Acute and chronic respiratory failure with hypoxia: Secondary | ICD-10-CM | POA: Diagnosis not present

## 2024-05-13 LAB — CBC
HCT: 32.8 % — ABNORMAL LOW (ref 39.0–52.0)
Hemoglobin: 10.4 g/dL — ABNORMAL LOW (ref 13.0–17.0)
MCH: 28 pg (ref 26.0–34.0)
MCHC: 31.7 g/dL (ref 30.0–36.0)
MCV: 88.4 fL (ref 80.0–100.0)
Platelets: 659 K/uL — ABNORMAL HIGH (ref 150–400)
RBC: 3.71 MIL/uL — ABNORMAL LOW (ref 4.22–5.81)
RDW: 14.2 % (ref 11.5–15.5)
WBC: 28.9 K/uL — ABNORMAL HIGH (ref 4.0–10.5)
nRBC: 0 % (ref 0.0–0.2)

## 2024-05-13 LAB — BASIC METABOLIC PANEL WITH GFR
Anion gap: 11 (ref 5–15)
BUN: 18 mg/dL (ref 8–23)
CO2: 29 mmol/L (ref 22–32)
Calcium: 8.7 mg/dL — ABNORMAL LOW (ref 8.9–10.3)
Chloride: 98 mmol/L (ref 98–111)
Creatinine, Ser: 0.65 mg/dL (ref 0.61–1.24)
GFR, Estimated: >60 mL/min (ref >60)
Glucose, Bld: 126 mg/dL — ABNORMAL HIGH (ref 70–99)
Potassium: 4 mmol/L (ref 3.5–5.1)
Sodium: 138 mmol/L (ref 135–145)

## 2024-05-13 LAB — GLUCOSE, CAPILLARY: Glucose-Capillary: 128 mg/dL — ABNORMAL HIGH (ref 70–99)

## 2024-05-13 LAB — PROCALCITONIN: Procalcitonin: 1.19 ng/mL

## 2024-05-13 MED ORDER — SODIUM CHLORIDE 0.9 % IV SOLN
2.0000 g | INTRAVENOUS | Status: DC
  Administered 2024-05-13 – 2024-05-14 (×2): 2 g via INTRAVENOUS
  Filled 2024-05-13 (×2): qty 20

## 2024-05-13 MED ORDER — FLUCONAZOLE 200 MG PO TABS
200.0000 mg | ORAL_TABLET | Freq: Every day | ORAL | Status: DC
  Administered 2024-05-13 – 2024-05-17 (×5): 200 mg via ORAL
  Filled 2024-05-13 (×5): qty 1

## 2024-05-13 MED ORDER — DOXYCYCLINE HYCLATE 100 MG PO TABS
100.0000 mg | ORAL_TABLET | Freq: Two times a day (BID) | ORAL | Status: DC
Start: 2024-05-13 — End: 2024-05-15
  Administered 2024-05-13 – 2024-05-14 (×4): 100 mg via ORAL
  Filled 2024-05-13 (×4): qty 1

## 2024-05-13 NOTE — Progress Notes (Signed)
 PT Cancellation Note  Patient Details Name: Matthew Hall MRN: [id number] DOB: 05-06-1946   Cancelled Treatment:    Reason Eval/Treat Not Completed: (P) Fatigue/lethargy limiting ability to participate Attempt ~ 1355 but pt had just got back to bed per RN. Reattempt at 1700, pt defers. (pt just starting to eat and defers OOB/EOB transfers at this time.) Will continue efforts per PT plan of care as schedule permits.   Christel Bai M Takera Rayl 05/13/2024, 5:02 PM

## 2024-05-13 NOTE — Discharge Instructions (Signed)

## 2024-05-13 NOTE — Consult Note (Signed)
 [facility]    Date of Admission:  05/06/2024     Total days of antibiotics 3               Reason for Consult: Fever, PNA?    Referring Provider: Juvenal  Primary Care Provider: Candise [name], MD   Assessment: Matthew Hall is a 78 y.o. male admitted with:   Shortness of Breath -  Fever (day 7 to 80 F) -  Leukocytosis -  Bronchiectasis Flare -  Chronic respiratory failure 2 LPM. Viral testing at admission is negative. CXR w/o any new features. Antibiotics stopped after concern was more for fluid overload with BNP elevated. Cultures are pending from yesterday 11/3 after acute respiratory event where rapid response was called. In talking with Matthew Hall, it sounds like he had a pretty significant chocking episode where he may have had aspiration event. Given his underlying structural lung disease, he is more at risk for aspiration pneumonitis/pneumonia. He had rapid improvement over the last 12 hours to what he ascirbes to, which is reassuring. I suspect more pneumonitis.  - Will continue doxycycline  and changed to ceftriaxone  IV and follow up CBC in AM.  - CT max/face is not concerning for infection - Suspect he had acute aspiration pneumonitis/pneumonia event.   Weight loss -  Unintentional weight loss following his stroke. Dental procedure 52m ago with 10-d of clindamycin used for treatment. CT max/face reviewed and no concern for any infection post removal. Echo is w/o concern as well.    H/O Vasculitis -  ESR, CRP are elevated - unclear what to make of these given lack of specificity. In speaking with him this has not recurred since 2018.   Oral thrush -  Add fluconazole 200 mg daily for a few days to help.   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 Overton in our ID clinic - will order AFB for more data to use in outpatient decision to help with treatment  decisions.  - standard isolation - this is NOT TB we are worried about - AFB smear/culture from sputum please   I called and d/w his daughter, Matthew Hall on the phone for about 20 minutes to discuss and update with our thoughts.   Plan: - Continue doxycycline  and add ceftriaxone   - Repeat CBC in AM   - standard isolation - this is NOT TB we are worried about - AFB smear/culture from sputum please     Principal Problem:   Acute on chronic respiratory failure with hypoxemia (HCC) Active Problems:   Acute on chronic congestive heart failure (HCC)   Protein-calorie malnutrition, severe    apixaban   5 mg Oral BID   budesonide -glycopyrrolate -formoterol   2 puff Inhalation BID   busPIRone   10 mg Oral BID   doxycycline   100 mg Oral Q12H   feeding supplement (GLUCERNA SHAKE)  237 mL Oral BID BM   furosemide   20 mg Oral Daily   guaiFENesin   600 mg Oral BID   iron polysaccharides  150 mg Oral Daily   metoprolol  succinate  25 mg Oral BID   multivitamin with minerals  1 tablet Oral Daily   pantoprazole   40 mg Oral Daily   sodium chloride  flush  3 mL Intravenous Q12H   thiamine  100 mg Oral Daily   triamcinolone   1 spray Nasal Daily    HPI: TRAVANTI Hall is  a 78 y.o. male admitted for shortness of breath.   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 He recalls the fever was during the day. He has had a slight cough but not much productive.   He did get chocked on his pills earlier in the day yesterday. Kept coughing to get them up.   Has lost > 20 lbs in the last few months since having a  stroke. He recalls the last month he has woken up in a sweat. Had dental procedures  RVP negative yesterday. 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  He said he felt like he was dying yesterday but much better today.    Review of Systems: Review of Systems  Constitutional:  Positive for weight loss. Negative for chills and fever.  HENT:  Negative for tinnitus.   Eyes:  Negative for blurred vision and photophobia.  Respiratory:  Positive for cough and shortness of breath. Negative for sputum production and wheezing.   Cardiovascular:  Negative for chest pain.  Gastrointestinal:  Negative for diarrhea, nausea and vomiting.  Genitourinary:  Negative for dysuria.  Skin:  Negative for rash.  Neurological:  Negative for headaches.    Past Medical History:  Diagnosis Date   Anemia, chronic disease    Anxiety    Aortic atherosclerosis    Atrial fibrillation (HCC)    Bladder cancer (HCC)    Bronchiectasis (HCC)    Chronic systolic heart failure (HCC)    Colon polyps    COPD (chronic obstructive pulmonary disease) (HCC)    Diabetes mellitus with complication (HCC)    Diabetic peripheral neuropathy (HCC) 12/27/2020   Diverticulosis    Essential hypertension    GERD (gastroesophageal reflux disease)    History of CVA with residual deficit    R foot drop   Nephrolithiasis    OSA (obstructive sleep apnea)    Osteoarthritis    Pulmonary nodule    surveillance by Dr. 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 ?hx of mononeuritis multiplex    Social History   Tobacco Use   Smoking status: Former    Current packs/day: 0.00    Average packs/day: 1 pack/day for 30.0 years (30.0 ttl pk-yrs)    Types: Cigarettes    Start date: 07/06/1981    Quit date: 07/07/2011    Years since quitting: 12.8   Smokeless tobacco: Never  Vaping Use    Vaping status: Never Used  Substance Use Topics   Alcohol  use: No    Alcohol /week: 0.0 standard drinks of alcohol    Drug use: No    Family History  Problem Relation Age of Onset   Diabetes Mother    CAD Mother    Emphysema Father    Diabetes Sister    Stroke Sister    Lung cancer Brother    Liver cancer Brother    Colon cancer Neg Hx    Allergies  Allergen Reactions  Simvastatin Rash   Cortisone Nausea And Vomiting   Albuterol  Palpitations and Other (See Comments)    Pt is okay to take xopenex , albuterol  raises his heart rate   Augmentin [Amoxicillin-Pot Clavulanate] Rash   Ciprofloxacin Rash   Contrast Media [Iodinated Contrast Media] Rash   Flagyl  [Metronidazole ] Rash   Latex Hives and Rash   Sulfa  Antibiotics Rash    OBJECTIVE: Blood pressure 135/70, pulse 72, temperature 98.2 F (36.8 C), temperature source Oral, resp. rate 20, height 5' 11 (1.803 m), weight 61 kg, SpO2 96%.  Physical Exam Vitals reviewed.  Constitutional:      Appearance: Normal appearance. He is not ill-appearing.  HENT:     Head: Normocephalic.     Mouth/Throat:     Mouth: Mucous membranes are moist.     Pharynx: Oropharynx is clear.  Eyes:     General: No scleral icterus. Pulmonary:     Effort: No tachypnea, accessory muscle usage or respiratory distress.     Breath sounds: Normal breath sounds. No wheezing or rhonchi.  Musculoskeletal:        General: Normal range of motion.     Cervical back: Normal range of motion.  Skin:    Coloration: Skin is not jaundiced or pale.  Neurological:     Mental Status: He is alert and oriented to person, place, and time.  Psychiatric:        Mood and Affect: Mood normal.        Judgment: Judgment normal.     Lab Results Lab Results  Component Value Date   WBC 28.9 (H) 05/13/2024   HGB 10.4 (L) 05/13/2024   HCT 32.8 (L) 05/13/2024   MCV 88.4 05/13/2024   PLT 659 (H) 05/13/2024    Lab Results  Component Value Date   CREATININE 0.65  05/13/2024   BUN 18 05/13/2024   NA 138 05/13/2024   K 4.0 05/13/2024   CL 98 05/13/2024   CO2 29 05/13/2024    Lab Results  Component Value Date   ALT 23 05/06/2024   AST 27 05/06/2024   ALKPHOS 140 (H) 05/06/2024   BILITOT 0.6 05/06/2024     Microbiology: Recent Results (from the past 240 hours)  Resp panel by RT-PCR (RSV, Flu A&B, Covid) Anterior Nasal Swab     Status: None   Collection Time: 05/06/24  6:16 PM   Specimen: Anterior Nasal Swab  Result Value Ref Range Status   SARS Coronavirus 2 by RT PCR NEGATIVE NEGATIVE Final    Comment: (NOTE) SARS-CoV-2 target nucleic acids are NOT DETECTED.  The SARS-CoV-2 RNA is generally detectable in upper respiratory specimens during the acute phase of infection. The lowest concentration of SARS-CoV-2 viral copies this assay can detect is 138 copies/mL. A negative result does not preclude SARS-Cov-2 infection and should not be used as the sole basis for treatment or other patient management decisions. A negative result may occur with  improper specimen collection/handling, submission of specimen other than nasopharyngeal swab, presence of viral mutation(s) within the areas targeted by this assay, and inadequate number of viral copies(<138 copies/mL). A negative result must be combined with clinical observations, patient history, and epidemiological information. The expected result is Negative.  Fact Sheet for Patients:  bloggercourse.com  Fact Sheet for Healthcare Providers:  seriousbroker.it  This test is no t yet approved or cleared by the [place]  FDA and  has been authorized for detection and/or diagnosis of SARS-CoV-2 by FDA under an Emergency Use Authorization (EUA).  This EUA will remain  in effect (meaning this test can be used) for the duration of the COVID-19 declaration under Section 564(b)(1) of the Act, 21 U.S.C.section 360bbb-3(b)(1), unless the authorization  is terminated  or revoked sooner.       Influenza A by PCR NEGATIVE NEGATIVE Final   Influenza B by PCR NEGATIVE NEGATIVE Final    Comment: (NOTE) The Xpert Xpress SARS-CoV-2/FLU/RSV plus assay is intended as an aid in the diagnosis of influenza from Nasopharyngeal swab specimens and should not be used as a sole basis for treatment. Nasal washings and aspirates are unacceptable for Xpert Xpress SARS-CoV-2/FLU/RSV testing.  Fact Sheet for Patients: bloggercourse.com  Fact Sheet for Healthcare Providers: seriousbroker.it  This test is not yet approved or cleared by the [place]  FDA and has been authorized for detection and/or diagnosis of SARS-CoV-2 by FDA under an Emergency Use Authorization (EUA). This EUA will remain in effect (meaning this test can be used) for the duration of the COVID-19 declaration under Section 564(b)(1) of the Act, 21 U.S.C. section 360bbb-3(b)(1), unless the authorization is terminated or revoked.     Resp Syncytial Virus by PCR NEGATIVE NEGATIVE Final    Comment: (NOTE) Fact Sheet for Patients: bloggercourse.com  Fact Sheet for Healthcare Providers: seriousbroker.it  This test is not yet approved or cleared by the [place]  FDA and has been authorized for detection and/or diagnosis of SARS-CoV-2 by FDA under an Emergency Use Authorization (EUA). This EUA will remain in effect (meaning this test can be used) for the duration of the COVID-19 declaration under Section 564(b)(1) of the Act, 21 U.S.C. section 360bbb-3(b)(1), unless the authorization is terminated or revoked.  Performed at [facility], [address] [place], [place] [address]   Blood Culture (routine x 2)     Status: None   Collection Time: 05/06/24  6:31 PM   Specimen: BLOOD  Result Value Ref Range Status   Specimen Description   Final    BLOOD LEFT  ANTECUBITAL Performed at [facility], [address] [place], [place] [address]    Special Requests   Final    Blood Culture adequate volume BOTTLES DRAWN AEROBIC AND ANAEROBIC Performed at [facility], [address] [place], [place] [address]    Culture   Final    NO GROWTH 5 DAYS Performed at [facility], [address], [place], [place] [address]    Report Status 05/11/2024 FINAL  Final  Blood Culture (routine x 2)     Status: None   Collection Time: 05/06/24  7:15 PM   Specimen: BLOOD  Result Value Ref Range Status   Specimen Description   Final    BLOOD RIGHT ANTECUBITAL Performed at [facility], [address] [place], [place] [address]    Special Requests   Final    Blood Culture adequate volume BOTTLES DRAWN AEROBIC AND ANAEROBIC Performed at [facility], [address] [place], [place] [address]    Culture   Final    NO GROWTH 5 DAYS Performed at [facility], [address], [place], [place] [address]    Report Status 05/11/2024 FINAL  Final  MRSA Next Gen by PCR, Nasal     Status: Abnormal   Collection Time: 05/06/24 10:41 PM   Specimen: Nasal Mucosa; Nasal Swab  Result Value Ref Range Status   MRSA by PCR Next Gen DETECTED (A) NOT DETECTED Final    Comment: RESULTS CALLED TO READ  BACK BY AND VERIFED WITH RN M.LAYTON ON 05/07/24 AT 0021 BY NM (NOTE) The GeneXpert MRSA Assay (FDA approved for NASAL specimens only), is one component of a comprehensive MRSA colonization surveillance program. It is not intended to diagnose MRSA infection nor to guide or monitor treatment for MRSA infections. Test performance is not FDA approved in patients less than 1 years old. Performed at [facility], [address] [place], [place] [address]   Respiratory (~20 pathogens) panel by PCR     Status: None   Collection Time: 05/06/24 11:35 PM   Specimen: Nasopharyngeal  Swab; Respiratory  Result Value Ref Range Status   Adenovirus NOT DETECTED NOT DETECTED Final   Coronavirus 229E NOT DETECTED NOT DETECTED Final    Comment: (NOTE) The Coronavirus on the Respiratory Panel, DOES NOT test for the novel  Coronavirus (2019 nCoV)    Coronavirus HKU1 NOT DETECTED NOT DETECTED Final   Coronavirus NL63 NOT DETECTED NOT DETECTED Final   Coronavirus OC43 NOT DETECTED NOT DETECTED Final   Metapneumovirus NOT DETECTED NOT DETECTED Final   Rhinovirus / Enterovirus NOT DETECTED NOT DETECTED Final   Influenza A NOT DETECTED NOT DETECTED Final   Influenza B NOT DETECTED NOT DETECTED Final   Parainfluenza Virus 1 NOT DETECTED NOT DETECTED Final   Parainfluenza Virus 2 NOT DETECTED NOT DETECTED Final   Parainfluenza Virus 3 NOT DETECTED NOT DETECTED Final   Parainfluenza Virus 4 NOT DETECTED NOT DETECTED Final   Respiratory Syncytial Virus NOT DETECTED NOT DETECTED Final   Bordetella pertussis NOT DETECTED NOT DETECTED Final   Bordetella Parapertussis NOT DETECTED NOT DETECTED Final   Chlamydophila pneumoniae NOT DETECTED NOT DETECTED Final   Mycoplasma pneumoniae NOT DETECTED NOT DETECTED Final    Comment: Performed at [facility], [address], [place], [place] [address]  Respiratory (~20 pathogens) panel by PCR     Status: None   Collection Time: 05/12/24  5:06 PM   Specimen: Nasopharyngeal Swab; Respiratory  Result Value Ref Range Status   Adenovirus NOT DETECTED NOT DETECTED Final   Coronavirus 229E NOT DETECTED NOT DETECTED Final    Comment: (NOTE) The Coronavirus on the Respiratory Panel, DOES NOT test for the novel  Coronavirus (2019 nCoV)    Coronavirus HKU1 NOT DETECTED NOT DETECTED Final   Coronavirus NL63 NOT DETECTED NOT DETECTED Final   Coronavirus OC43 NOT DETECTED NOT DETECTED Final   Metapneumovirus NOT DETECTED NOT DETECTED Final   Rhinovirus / Enterovirus NOT DETECTED NOT DETECTED Final   Influenza A NOT DETECTED NOT DETECTED Final    Influenza B NOT DETECTED NOT DETECTED Final   Parainfluenza Virus 1 NOT DETECTED NOT DETECTED Final   Parainfluenza Virus 2 NOT DETECTED NOT DETECTED Final   Parainfluenza Virus 3 NOT DETECTED NOT DETECTED Final   Parainfluenza Virus 4 NOT DETECTED NOT DETECTED Final   Respiratory Syncytial Virus NOT DETECTED NOT DETECTED Final   Bordetella pertussis NOT DETECTED NOT DETECTED Final   Bordetella Parapertussis NOT DETECTED NOT DETECTED Final   Chlamydophila pneumoniae NOT DETECTED NOT DETECTED Final   Mycoplasma pneumoniae NOT DETECTED NOT DETECTED Final    Comment: Performed at [facility], [address], [place], [place] [address]  Resp panel by RT-PCR (RSV, Flu A&B, Covid)     Status: None   Collection Time: 05/12/24  6:07 PM  Result Value Ref Range Status   SARS Coronavirus 2 by RT PCR NEGATIVE NEGATIVE Final   Influenza A by  PCR NEGATIVE NEGATIVE Final   Influenza B by PCR NEGATIVE NEGATIVE Final    Comment: (NOTE) The Xpert Xpress SARS-CoV-2/FLU/RSV plus assay is intended as an aid in the diagnosis of influenza from Nasopharyngeal swab specimens and should not be used as a sole basis for treatment. Nasal washings and aspirates are unacceptable for Xpert Xpress SARS-CoV-2/FLU/RSV testing.  Fact Sheet for Patients: bloggercourse.com  Fact Sheet for Healthcare Providers: seriousbroker.it  This test is not yet approved or cleared by the [place]  FDA and has been authorized for detection and/or diagnosis of SARS-CoV-2 by FDA under an Emergency Use Authorization (EUA). This EUA will remain in effect (meaning this test can be used) for the duration of the COVID-19 declaration under Section 564(b)(1) of the Act, 21 U.S.C. section 360bbb-3(b)(1), unless the authorization is terminated or revoked.     Resp Syncytial Virus by PCR NEGATIVE NEGATIVE Final    Comment: (NOTE) Fact Sheet for  Patients: bloggercourse.com  Fact Sheet for Healthcare Providers: seriousbroker.it  This test is not yet approved or cleared by the [place]  FDA and has been authorized for detection and/or diagnosis of SARS-CoV-2 by FDA under an Emergency Use Authorization (EUA). This EUA will remain in effect (meaning this test can be used) for the duration of the COVID-19 declaration under Section 564(b)(1) of the Act, 21 U.S.C. section 360bbb-3(b)(1), unless the authorization is terminated or revoked.  Performed at [facility], [address], [place], [place] [address]   Culture, blood (Routine X 2) w Reflex to ID Panel     Status: None (Preliminary result)   Collection Time: 05/12/24  6:31 PM   Specimen: BLOOD  Result Value Ref Range Status   Specimen Description BLOOD SITE NOT SPECIFIED  Final   Special Requests   Final    BOTTLES DRAWN AEROBIC AND ANAEROBIC Blood Culture results may not be optimal due to an inadequate volume of blood received in culture bottles   Culture   Final    NO GROWTH < 12 HOURS Performed at [facility], [address], [place], [place] [address]    Report Status PENDING  Incomplete  Culture, blood (Routine X 2) w Reflex to ID Panel     Status: None (Preliminary result)   Collection Time: 05/12/24  6:33 PM   Specimen: BLOOD RIGHT HAND  Result Value Ref Range Status   Specimen Description BLOOD RIGHT HAND  Final   Special Requests   Final    BOTTLES DRAWN AEROBIC AND ANAEROBIC Blood Culture adequate volume   Culture   Final    NO GROWTH < 12 HOURS Performed at [facility], [address], [place], [place] [address]    Report Status PENDING  Incomplete  Expectorated Sputum Assessment w Gram Stain, Rflx to Resp Cult     Status: None   Collection Time: 05/12/24  8:30 PM   Specimen: Expectorated Sputum  Result Value Ref Range Status   Specimen Description EXPECTORATED SPUTUM  Final    Special Requests Normal  Final   Sputum evaluation   Final    THIS SPECIMEN IS ACCEPTABLE FOR SPUTUM CULTURE Performed at [facility], [address] [place], [place] [address]    Report Status 05/12/2024 FINAL  Final  Culture, Respiratory w Gram Stain     Status: None (Preliminary result)   Collection Time: 05/12/24  8:30 PM  Result Value Ref Range Status   Specimen Description EXPECTORATED SPUTUM  Final  Special Requests Normal Reflexed from [id number]  Final   Gram Stain   Final    FEW WBC PRESENT,BOTH PMN AND MONONUCLEAR MODERATE GRAM NEGATIVE RODS FEW GRAM POSITIVE COCCI    Culture   Final    CULTURE REINCUBATED FOR BETTER GROWTH Performed at [facility], [address], [place], [place] [address]    Report Status PENDING  Incomplete    Corean Fireman, MSN, NP-C [facility] Pager: [phone]  05/13/2024 12:17 PM    Total Encounter Time: 40 m

## 2024-05-13 NOTE — Progress Notes (Signed)
 [facility] night cross cover note:   I have been following for result of CT maxillofacial without contrast, as ordered by rounding hospitalist.  This imaging study has been completed, although formal read is not yet available, including when I access the imaging through PAC's. Will continue to follow for this result.   I also followed-up on labs ordered by rounding hospitalist for further evaluation of objective fever that occurred yesterday (11/3).  Respiratory viral panel was pan negative.  COVID, influenza, RSV PCR were all negative.  Lactic acid 1.3.  Blood cultures x 2 were collected yesterday followed by initiation of azithromycin  and Rocephin  for potential pna.  Expectorated sputum sample with gram stain/culture, preliminarily shows moderate gram-negative rods as well as a few gram-positive cocci.   Updated CBC this morning shows interval increase in the patient's leukocytosis, with white blood cell count now 28,900 compared to yesterday morning's value of 15,000.  This morning's BMP is currently pending.  Additionally, in monitoring the patient's vital signs overnight, patient's objective fever that occurred towards the end of yesterday's dayshift, with temperature max of 103.4 around 1700, has resolved, with the patient afebrile thus far on this evening's night shift, with most recent temperature noted to be 99.7.  Tachycardia noted at the end of yesterday's day shift is also improved, with most recent heart rates in the 70s to 80s; most recent systolic blood pressures in the 120s mmHg.  He remains mildly tachypneic, with respiratory rate in the low 20s, although this appears significantly improved relative to respiratory rate in the 30s towards the end of yesterday's dayshift.  Additionally, supplemental oxygen  requirement appears stable, with the patient maintaining O2 sats in the high 90s on 4 L nasal cannula, unchanged relative to the end of day shift yesterday.  I have added-on procalcitonin  level.      Eva Pore, DO Hospitalist

## 2024-05-13 NOTE — Progress Notes (Signed)
 Ceftriaxone jennell for PNA. Since pt had recent MRSA PCR+, ok to change azith to doxy per Dr. Vann.   Sergio Batch, PharmD, BCIDP, AAHIVP, CPP Infectious Disease Pharmacist 05/13/2024 8:56 AM

## 2024-05-13 NOTE — Progress Notes (Signed)
 PROGRESS NOTE    Matthew Hall  FMW:[id number] DOB: Aug 31, 1945 DOA: 05/06/2024 PCP: Candise [name], MD    Brief Narrative:  78 y.o. 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  Seen by pulmonology at admission and they agree it i was not pneumonia/bronchiectasis most likely volume overload.  He responded very well to diuresis, returning to his home levels of oxygen -room air during the day and 2 L at night. D/C held as family out of town on vacation but overnight on 11/3 patient's white blood count jumped from 11--->15 and now is 28 and had a temperature of 99.6 at spiked to 103.    ID consulted.   Assessment and Plan: Suspected acute exacerbation of diastolic heart failure Chronic hypoxic respiratory failure - 2L at night --pulm consulted considering complex underlying lung disease -Not convinced that this is a COPD exacerbation, certainly not bronchiectasis exacerbation and he does not have CAP so doubt need for antibiotics - He has improved with diuresis, has mildly elevated BNP previous echo showed EF 40 to 45% and small effusions noted on imaging, decompensated heart failure possible -- Upon admission CT chest done-no new lung consolidations, trace pleural effusions -- Received diuresis at OSH with concern for HF-have given IV Lasix  x 2 in total with close to 4 L of diuresis and improvement in breathing- will give Lasix  daily (will need to have close outpatient follow-up to make sure we are not over diuresing) -Weights not accurate as they are not  standing.  -echo: EF 50 to 55%-improved from prior-- needs outpatient follow up -- ESR, CRP-done and elevated but nonspecific -- ANA, ANCA, C3/4 with his prior hx vasculitis are negative -- Continue home Breztri . saline neb BID, Duonebs (sub alb for xopenex ) q6 hr, IS, guaifenisen, OOB to chair during the day  -- wean O2 to off during the day-- only wears at night   Leukocytosis plus fever - Smear normal -Denies diarrhea, denies skin breakdown, urinalysis done, x-ray done with no indication of infection -MRSA swab + -- pharmacy to help adjust abx -sputum culture pending - ID consult - Family concerned about malignancies-can place outpatient referral to hematology/oncology for further workup    Elevated troponin Minimal elevation just above reference limit < 15 -> 21 hs troponin. EKG is limited by artifact but no overt ischemic changes. And he has no component of chest pain. Suspect demand in setting of his acute respiratory process above.  -- Outpatient follow-up with cardiology   Recent dental extraction  -- No signs of complication on exam, well healed.  -Patient denies any mouth pain or discomfort -Eating well-75 to 100% thus far today of meals plus patient states he had 3 protein shakes yesterday CT maxillofacial does not show any osteomyelitis or abscess   Deconditioning  -- PT evaluation -walked 300 feet on room air with PT   Hypokalemia/hypomagnesemia/hypophosphatemia -repleted   Anemia-iron deficiency -suspect related to poor po intake/blood loss from recent dental extractions -hgb stable but lower than baseline -PCP follow up - Iron p.o. for now   Nutrition Status: Nutrition Problem: Severe Malnutrition Etiology: social / environmental circumstances Signs/Symptoms:  percent weight loss, severe muscle depletion, moderate fat depletion Percent weight loss: 22 % (in one year) Interventions: Glucerna shake, MVI   DVT prophylaxis:  apixaban  (ELIQUIS ) tablet 5 mg     Code Status: Full Code Family Communication: Daughter at bedside  Disposition Plan:  Level of care: Progressive Status is: Inpatient     Consultants:  Pulmonary-signed off ID  Subjective: Overnight felt very poor when he had a temperature of 103 Denies diarrhea   Objective: Vitals:   05/13/24 0330 05/13/24 0800 05/13/24 0802 05/13/24 1133  BP: 114/76 (!) 117/55 118/61 135/70  Pulse: 80 84 83 72  Resp: (!) 23 (!) 23 (!) 31 20  Temp: 98.1 F (36.7 C) 98.3 F (36.8 C)  98.2 F (36.8 C)  TempSrc: Oral Oral  Oral  SpO2: 97% 96% 96%   Weight: 61 kg     Height:        Intake/Output Summary (Last 24 hours) at 05/13/2024 1503 Last data filed at 05/13/2024 1100 Gross per 24 hour  Intake 1005.58 ml  Output 1200 ml  Net -194.42 ml   Filed Weights   05/11/24 0559 05/12/24 0345 05/13/24 0330  Weight: 62.3 kg 62.3 kg 61 kg    Examination:   General: Appearance:    Thin male in no acute distress   No wounds on feet  Lungs:   On [place], mild increased work of breathing  Heart:    Normal heart rate.    MS:   All extremities are intact.    Neurologic:   Awake, alert, pleasant cooperative       Data Reviewed: I have personally reviewed following labs and imaging studies  CBC: Recent Labs  Lab 05/10/24 0214 05/10/24 1244 05/11/24 0152 05/12/24 0222 05/13/24 0227  WBC 11.6* 12.9* 11.9* 15.0* 28.9*  NEUTROABS  --  10.0*  --   --   --   HGB 10.1* 10.0* 10.5* 10.3* 10.4*  HCT 31.9* 32.1* 33.5* 33.2* 32.8*  MCV 88.4 90.7 89.8 89.7 88.4  PLT 587* 579* 609* 641* 659*   Basic Metabolic Panel: Recent Labs  Lab 05/07/24 0303 05/09/24 0235 05/10/24 0214 05/11/24 0152 05/12/24 0222 05/13/24 0227  NA 132* 135 135 137 134* 138  K 3.2* 3.5 3.8 3.6 4.1 4.0  CL 90* 96* 98 99 99 98  CO2 29 27 28 28 26 29   GLUCOSE 159* 135* 141* 137* 159* 126*  BUN 16 16 16 16 19 18   CREATININE 0.80 0.84 0.87 0.77 0.83 0.65  CALCIUM  8.9 8.4* 8.2* 8.6* 8.3* 8.7*  MG 1.6* 2.0  --   --    --   --   PHOS 2.2* 2.8  --   --   --   --    GFR: Estimated Creatinine Clearance: 63.5 mL/min (by C-G formula based on SCr of 0.65 mg/dL). Liver Function Tests: Recent Labs  Lab 05/06/24 1831  AST 27  ALT 23  ALKPHOS 140*  BILITOT 0.6  PROT 7.2  ALBUMIN 3.2*   No results for input(s): LIPASE, AMYLASE in the last 168 hours. No results for input(s): AMMONIA in the last 168 hours. Coagulation Profile: Recent Labs  Lab 05/06/24 1831  INR 1.6*   Cardiac Enzymes: No results for input(s): CKTOTAL, CKMB, CKMBINDEX, TROPONINI in the last 168 hours.   HbA1C: No results for input(s): HGBA1C in the last 72 hours. CBG: Recent Labs  Lab 05/09/24 0521 05/10/24 0612 05/11/24 0543 05/12/24 0612 05/13/24 0403  GLUCAP 115* 108* 107* 95  128*   Lipid Profile: No results for input(s): CHOL, HDL, LDLCALC, TRIG, CHOLHDL, LDLDIRECT in the last 72 hours. Thyroid  Function Tests: No results for input(s): TSH, T4TOTAL, FREET4, T3FREE, THYROIDAB in the last 72 hours. Anemia Panel: No results for input(s): VITAMINB12, FOLATE, FERRITIN, TIBC, IRON, RETICCTPCT in the last 72 hours.  Sepsis Labs: Recent Labs  Lab 05/06/24 1831 05/07/24 0303 05/12/24 1831 05/13/24 0227  PROCALCITON  --  <0.10  --  1.19  LATICACIDVEN 1.2  --  1.3  --     Recent Results (from the past 240 hours)  Resp panel by RT-PCR (RSV, Flu A&B, Covid) Anterior Nasal Swab     Status: None   Collection Time: 05/06/24  6:16 PM   Specimen: Anterior Nasal Swab  Result Value Ref Range Status   SARS Coronavirus 2 by RT PCR NEGATIVE NEGATIVE Final    Comment: (NOTE) SARS-CoV-2 target nucleic acids are NOT DETECTED.  The SARS-CoV-2 RNA is generally detectable in upper respiratory specimens during the acute phase of infection. The lowest concentration of SARS-CoV-2 viral copies this assay can detect is 138 copies/mL. A negative result does not preclude  SARS-Cov-2 infection and should not be used as the sole basis for treatment or other patient management decisions. A negative result may occur with  improper specimen collection/handling, submission of specimen other than nasopharyngeal swab, presence of viral mutation(s) within the areas targeted by this assay, and inadequate number of viral copies(<138 copies/mL). A negative result must be combined with clinical observations, patient history, and epidemiological information. The expected result is Negative.  Fact Sheet for Patients:  bloggercourse.com  Fact Sheet for Healthcare Providers:  seriousbroker.it  This test is no t yet approved or cleared by the [place]  FDA and  has been authorized for detection and/or diagnosis of SARS-CoV-2 by FDA under an Emergency Use Authorization (EUA). This EUA will remain  in effect (meaning this test can be used) for the duration of the COVID-19 declaration under Section 564(b)(1) of the Act, 21 U.S.C.section 360bbb-3(b)(1), unless the authorization is terminated  or revoked sooner.       Influenza A by PCR NEGATIVE NEGATIVE Final   Influenza B by PCR NEGATIVE NEGATIVE Final    Comment: (NOTE) The Xpert Xpress SARS-CoV-2/FLU/RSV plus assay is intended as an aid in the diagnosis of influenza from Nasopharyngeal swab specimens and should not be used as a sole basis for treatment. Nasal washings and aspirates are unacceptable for Xpert Xpress SARS-CoV-2/FLU/RSV testing.  Fact Sheet for Patients: bloggercourse.com  Fact Sheet for Healthcare Providers: seriousbroker.it  This test is not yet approved or cleared by the [place]  FDA and has been authorized for detection and/or diagnosis of SARS-CoV-2 by FDA under an Emergency Use Authorization (EUA). This EUA will remain in effect (meaning this test can be used) for the duration of  the COVID-19 declaration under Section 564(b)(1) of the Act, 21 U.S.C. section 360bbb-3(b)(1), unless the authorization is terminated or revoked.     Resp Syncytial Virus by PCR NEGATIVE NEGATIVE Final    Comment: (NOTE) Fact Sheet for Patients: bloggercourse.com  Fact Sheet for Healthcare Providers: seriousbroker.it  This test is not yet approved or cleared by the [place]  FDA and has been authorized for detection and/or diagnosis of SARS-CoV-2 by FDA under an Emergency Use Authorization (EUA). This EUA will remain in effect (meaning this test can be used) for the duration of the COVID-19 declaration under Section 564(b)(1) of the Act, 21 U.S.C. section 360bbb-3(b)(1), unless the  authorization is terminated or revoked.  Performed at [facility], [address] [place], [place] [address]   Blood Culture (routine x 2)     Status: None   Collection Time: 05/06/24  6:31 PM   Specimen: BLOOD  Result Value Ref Range Status   Specimen Description   Final    BLOOD LEFT ANTECUBITAL Performed at [facility], [address], [place], [place] [address]    Special Requests   Final    Blood Culture adequate volume BOTTLES DRAWN AEROBIC AND ANAEROBIC Performed at [facility], [address], [place], [place] [address]    Culture   Final    NO GROWTH 5 DAYS Performed at [facility], [address] [place], [place] [address]    Report Status 05/11/2024 FINAL  Final  Blood Culture (routine x 2)     Status: None   Collection Time: 05/06/24  7:15 PM   Specimen: BLOOD  Result Value Ref Range Status   Specimen Description   Final    BLOOD RIGHT ANTECUBITAL Performed at [facility], [address] [place], [place] [address]    Special Requests   Final    Blood Culture adequate volume BOTTLES DRAWN AEROBIC AND ANAEROBIC Performed at [facility], [address], [place], [place] [address]    Culture   Final    NO GROWTH 5 DAYS Performed at [facility], [address] [place], [place] [address]    Report Status 05/11/2024 FINAL  Final  MRSA Next Gen by PCR, Nasal     Status: Abnormal   Collection Time: 05/06/24 10:41 PM   Specimen: Nasal Mucosa; Nasal Swab  Result Value Ref Range Status   MRSA by PCR Next Gen DETECTED (A) NOT DETECTED Final    Comment: RESULTS CALLED TO READ BACK BY AND VERIFED WITH RN M.LAYTON ON 05/07/24 AT 0021 BY NM (NOTE) The GeneXpert MRSA Assay (FDA approved for NASAL specimens only), is one component of a comprehensive MRSA colonization surveillance program. It is not intended to diagnose MRSA infection nor to guide or monitor treatment for MRSA infections. Test performance is not FDA approved in patients less than 16 years old. Performed at [facility], [address], [place], [place] [address]   Respiratory (~20 pathogens) panel by PCR     Status: None   Collection Time: 05/06/24 11:35 PM   Specimen: Nasopharyngeal Swab; Respiratory  Result Value Ref Range Status   Adenovirus NOT DETECTED NOT DETECTED Final   Coronavirus 229E NOT DETECTED NOT DETECTED Final    Comment: (NOTE) The Coronavirus on the Respiratory Panel, DOES NOT test for the novel  Coronavirus (2019 nCoV)    Coronavirus HKU1 NOT DETECTED NOT DETECTED Final   Coronavirus NL63 NOT DETECTED NOT DETECTED Final   Coronavirus OC43 NOT DETECTED NOT DETECTED Final   Metapneumovirus NOT DETECTED NOT DETECTED Final   Rhinovirus / Enterovirus NOT DETECTED NOT DETECTED Final   Influenza A NOT DETECTED NOT DETECTED Final   Influenza B NOT DETECTED NOT DETECTED Final   Parainfluenza Virus 1 NOT DETECTED NOT DETECTED Final   Parainfluenza Virus 2 NOT DETECTED NOT DETECTED Final   Parainfluenza Virus 3 NOT DETECTED NOT DETECTED Final   Parainfluenza Virus 4 NOT DETECTED NOT DETECTED Final    Respiratory Syncytial Virus NOT DETECTED NOT DETECTED Final   Bordetella pertussis NOT DETECTED NOT DETECTED Final   Bordetella Parapertussis NOT DETECTED NOT DETECTED Final  Chlamydophila pneumoniae NOT DETECTED NOT DETECTED Final   Mycoplasma pneumoniae NOT DETECTED NOT DETECTED Final    Comment: Performed at [facility], [address], [place], [place] [address]  Respiratory (~20 pathogens) panel by PCR     Status: None   Collection Time: 05/12/24  5:06 PM   Specimen: Nasopharyngeal Swab; Respiratory  Result Value Ref Range Status   Adenovirus NOT DETECTED NOT DETECTED Final   Coronavirus 229E NOT DETECTED NOT DETECTED Final    Comment: (NOTE) The Coronavirus on the Respiratory Panel, DOES NOT test for the novel  Coronavirus (2019 nCoV)    Coronavirus HKU1 NOT DETECTED NOT DETECTED Final   Coronavirus NL63 NOT DETECTED NOT DETECTED Final   Coronavirus OC43 NOT DETECTED NOT DETECTED Final   Metapneumovirus NOT DETECTED NOT DETECTED Final   Rhinovirus / Enterovirus NOT DETECTED NOT DETECTED Final   Influenza A NOT DETECTED NOT DETECTED Final   Influenza B NOT DETECTED NOT DETECTED Final   Parainfluenza Virus 1 NOT DETECTED NOT DETECTED Final   Parainfluenza Virus 2 NOT DETECTED NOT DETECTED Final   Parainfluenza Virus 3 NOT DETECTED NOT DETECTED Final   Parainfluenza Virus 4 NOT DETECTED NOT DETECTED Final   Respiratory Syncytial Virus NOT DETECTED NOT DETECTED Final   Bordetella pertussis NOT DETECTED NOT DETECTED Final   Bordetella Parapertussis NOT DETECTED NOT DETECTED Final   Chlamydophila pneumoniae NOT DETECTED NOT DETECTED Final   Mycoplasma pneumoniae NOT DETECTED NOT DETECTED Final    Comment: Performed at [facility], [address] [place], [place] [address]  Resp panel by RT-PCR (RSV, Flu A&B, Covid)     Status: None   Collection Time: 05/12/24  6:07 PM  Result Value Ref Range Status   SARS Coronavirus 2 by RT PCR NEGATIVE NEGATIVE Final   Influenza  A by PCR NEGATIVE NEGATIVE Final   Influenza B by PCR NEGATIVE NEGATIVE Final    Comment: (NOTE) The Xpert Xpress SARS-CoV-2/FLU/RSV plus assay is intended as an aid in the diagnosis of influenza from Nasopharyngeal swab specimens and should not be used as a sole basis for treatment. Nasal washings and aspirates are unacceptable for Xpert Xpress SARS-CoV-2/FLU/RSV testing.  Fact Sheet for Patients: bloggercourse.com  Fact Sheet for Healthcare Providers: seriousbroker.it  This test is not yet approved or cleared by the [place]  FDA and has been authorized for detection and/or diagnosis of SARS-CoV-2 by FDA under an Emergency Use Authorization (EUA). This EUA will remain in effect (meaning this test can be used) for the duration of the COVID-19 declaration under Section 564(b)(1) of the Act, 21 U.S.C. section 360bbb-3(b)(1), unless the authorization is terminated or revoked.     Resp Syncytial Virus by PCR NEGATIVE NEGATIVE Final    Comment: (NOTE) Fact Sheet for Patients: bloggercourse.com  Fact Sheet for Healthcare Providers: seriousbroker.it  This test is not yet approved or cleared by the [place]  FDA and has been authorized for detection and/or diagnosis of SARS-CoV-2 by FDA under an Emergency Use Authorization (EUA). This EUA will remain in effect (meaning this test can be used) for the duration of the COVID-19 declaration under Section 564(b)(1) of the Act, 21 U.S.C. section 360bbb-3(b)(1), unless the authorization is terminated or revoked.  Performed at [facility], [address] [place], [place] [address]   Culture, blood (Routine X 2) w Reflex to ID Panel     Status: None (Preliminary result)   Collection Time: 05/12/24  6:31 PM   Specimen:  BLOOD  Result Value Ref Range Status   Specimen Description BLOOD SITE NOT SPECIFIED  Final   Special  Requests   Final    BOTTLES DRAWN AEROBIC AND ANAEROBIC Blood Culture results may not be optimal due to an inadequate volume of blood received in culture bottles   Culture   Final    NO GROWTH < 12 HOURS Performed at [facility], [address], [place], [place] [address]    Report Status PENDING  Incomplete  Culture, blood (Routine X 2) w Reflex to ID Panel     Status: None (Preliminary result)   Collection Time: 05/12/24  6:33 PM   Specimen: BLOOD RIGHT HAND  Result Value Ref Range Status   Specimen Description BLOOD RIGHT HAND  Final   Special Requests   Final    BOTTLES DRAWN AEROBIC AND ANAEROBIC Blood Culture adequate volume   Culture   Final    NO GROWTH < 12 HOURS Performed at [facility], [address], [place], [place] [address]    Report Status PENDING  Incomplete  Expectorated Sputum Assessment w Gram Stain, Rflx to Resp Cult     Status: None   Collection Time: 05/12/24  8:30 PM   Specimen: Expectorated Sputum  Result Value Ref Range Status   Specimen Description EXPECTORATED SPUTUM  Final   Special Requests Normal  Final   Sputum evaluation   Final    THIS SPECIMEN IS ACCEPTABLE FOR SPUTUM CULTURE Performed at [facility], [address] [place], [place] [address]    Report Status 05/12/2024 FINAL  Final  Culture, Respiratory w Gram Stain     Status: None (Preliminary result)   Collection Time: 05/12/24  8:30 PM  Result Value Ref Range Status   Specimen Description EXPECTORATED SPUTUM  Final   Special Requests Normal Reflexed from [id number]  Final   Gram Stain   Final    FEW WBC PRESENT,BOTH PMN AND MONONUCLEAR MODERATE GRAM NEGATIVE RODS FEW GRAM POSITIVE COCCI    Culture   Final    CULTURE REINCUBATED FOR BETTER GROWTH Performed at [facility], [address], [place], [place] [address]    Report Status PENDING  Incomplete         Radiology Studies: CT MAXILLOFACIAL WO CONTRAST Result Date: 05/13/2024 EXAM: CT OF THE FACE  WITHOUT CONTRAST 05/12/2024 10:16:01 PM TECHNIQUE: CT of the face was performed without the administration of intravenous contrast. Multiplanar reformatted images are provided for review. Automated exposure control, iterative reconstruction, and/or weight based adjustment of the mA/kV was utilized to reduce the radiation dose to as low as reasonably achievable. COMPARISON: CTA head and neck 10/28/2023. CLINICAL HISTORY: 78 year old male. Recent dental infection/abscess with 5 teeth removed, now with fever. FINDINGS: FACIAL BONES: Bilateral maxillary and mandible dental extractions with no adverse features identified. Underlying mandible and maxilla remain intact and aligned. Other facial bones are stable. Chronic leftward nasal septal deviation and spurring. Bilateral paranasal sinuses, visible middle ears and mastoids remain well aerated. Calcified atherosclerosis at the skull base. Stable visible non-contrast parenchyma. ORBITS: Postoperative changes to both globes. SINUSES AND MASTOIDS: Bilateral paranasal sinuses, visible middle ears and mastoids remain well aerated. SOFT TISSUES: Negative visible non-contrast larynx, pharynx, parapharyngeal spaces, retropharyngeal space, sublingual space, submandibular spaces, parotid spaces. Non-contrast bilateral masticator spaces also within normal limits. No soft tissue gas or inflammation is identified. No upper cervical lymphadenopathy. IMPRESSION: 1. Bilateral maxillary and mandible dental extractions with  no adverse features identified. 2. No inflammatory process identified in the non-contrast face. Electronically signed by: Helayne Hurst MD 05/13/2024 04:34 AM EST RP Workstation: [id number]   DG CHEST PORT 1 VIEW Result Date: 05/12/2024 EXAM: 1 VIEW(S) XRAY OF THE CHEST 05/12/2024 07:54:00 AM COMPARISON: None available. CLINICAL HISTORY: Pneumonia FINDINGS: LUNGS AND PLEURA: Chronic bilateral interstitial and reticular lung markings. Nodular density in lingula  corresponds to the scarring and bronchiectasis on CT 05/07/24. No pulmonary edema. No pleural effusion. No pneumothorax. HEART AND MEDIASTINUM: No acute abnormality of the cardiac and mediastinal silhouettes. BONES AND SOFT TISSUES: No acute osseous abnormality. IMPRESSION: 1. No acute findings. 2. Chronic bronchitis. Electronically signed by: Norman Gatlin MD 05/12/2024 02:52 PM EST RP Workstation: [id number]         Scheduled Meds:  apixaban   5 mg Oral BID   budesonide -glycopyrrolate -formoterol   2 puff Inhalation BID   busPIRone   10 mg Oral BID   doxycycline   100 mg Oral Q12H   feeding supplement (GLUCERNA SHAKE)  237 mL Oral BID BM   fluconazole  200 mg Oral Daily   furosemide   20 mg Oral Daily   guaiFENesin   600 mg Oral BID   iron polysaccharides  150 mg Oral Daily   metoprolol  succinate  25 mg Oral BID   multivitamin with minerals  1 tablet Oral Daily   pantoprazole   40 mg Oral Daily   sodium chloride  flush  3 mL Intravenous Q12H   thiamine  100 mg Oral Daily   triamcinolone   1 spray Nasal Daily   Continuous Infusions:  cefTRIAXone  (ROCEPHIN )  IV 2 g (05/13/24 0953)     LOS: 7 days    Time spent: 45 minutes spent on chart review, discussion with nursing staff, consultants, updating family and interview/physical exam; more than 50% of that time was spent in counseling and/or coordination of care.    Matthew RAYMOND Bowl, DO [facility] Available via Epic secure chat 7am-7pm After these hours, please refer to coverage provider listed on [email or web address] 05/13/2024, 3:03 PM

## 2024-05-13 NOTE — Plan of Care (Signed)
  Problem: Education: Goal: Knowledge of General Education information will improve Description: Including pain rating scale, medication(s)/side effects and non-pharmacologic comfort measures 05/13/2024 0527 by Wonda Earing D, RN Outcome: Progressing 05/13/2024 0526 by Wonda, Maeci Kalbfleisch D, RN Outcome: Progressing   Problem: Health Behavior/Discharge Planning: Goal: Ability to manage health-related needs will improve 05/13/2024 0527 by Wonda, Taisley Mordan D, RN Outcome: Not Progressing 05/13/2024 0526 by Wonda, Tylisha Danis D, RN Outcome: Progressing   Problem: Clinical Measurements: Goal: Ability to maintain clinical measurements within normal limits will improve 05/13/2024 0527 by Wonda, Bradie Sangiovanni D, RN Outcome: Not Progressing 05/13/2024 0526 by Wonda, Koy Lamp D, RN Outcome: Progressing Goal: Will remain free from infection 05/13/2024 0527 by Wonda, Elan Mcelvain D, RN Outcome: Not Progressing 05/13/2024 0526 by Wonda, Merril Nagy D, RN Outcome: Progressing Goal: Diagnostic test results will improve 05/13/2024 0527 by Wonda, Arron Tetrault D, RN Outcome: Not Progressing 05/13/2024 0526 by Wonda, Dylann Gallier D, RN Outcome: Progressing Goal: Respiratory complications will improve 05/13/2024 0527 by Wonda Earing D, RN Outcome: Progressing 05/13/2024 0526 by Wonda, Daphane Odekirk D, RN Outcome: Progressing Goal: Cardiovascular complication will be avoided 05/13/2024 0527 by Wonda Earing D, RN Outcome: Progressing 05/13/2024 0526 by Wonda Earing D, RN Outcome: Progressing   Problem: Activity: Goal: Risk for activity intolerance will decrease 05/13/2024 0527 by Wonda Earing D, RN Outcome: Not Progressing 05/13/2024 0526 by Wonda, Kinlie Janice D, RN Outcome: Progressing   Problem: Nutrition: Goal: Adequate nutrition will be maintained 05/13/2024 0527 by Wonda, Sloan Galentine D, RN Outcome: Progressing 05/13/2024 0526 by Wonda, Tamantha Saline D, RN Outcome: Progressing   Problem: Coping: Goal: Level of anxiety will decrease 05/13/2024 0527 by Wonda Earing D, RN Outcome: Not Progressing 05/13/2024 0526 by Wonda, Tommie Dejoseph D, RN Outcome: Progressing   Problem: Elimination: Goal: Will not experience complications related to bowel motility 05/13/2024 0527 by Wonda Earing D, RN Outcome: Progressing 05/13/2024 0526 by Wonda, Hebah Bogosian D, RN Outcome: Progressing Goal: Will not experience complications related to urinary retention 05/13/2024 0527 by Wonda, Deyvi Bonanno D, RN Outcome: Progressing 05/13/2024 0526 by Wonda, Milea Klink D, RN Outcome: Progressing   Problem: Pain Managment: Goal: General experience of comfort will improve and/or be controlled 05/13/2024 0527 by Wonda, Crosley Stejskal D, RN Outcome: Progressing 05/13/2024 0526 by Wonda, Lenvil Swaim D, RN Outcome: Progressing   Problem: Safety: Goal: Ability to remain free from injury will improve 05/13/2024 0527 by Wonda, Oliviarose Punch D, RN Outcome: Progressing 05/13/2024 0526 by Wonda, Aizley Stenseth D, RN Outcome: Progressing   Problem: Skin Integrity: Goal: Risk for impaired skin integrity will decrease 05/13/2024 0527 by Wonda Earing D, RN Outcome: Progressing 05/13/2024 0526 by Wonda, Raynold Blankenbaker D, RN Outcome: Progressing   Problem: Education: Goal: Ability to demonstrate management of disease process will improve 05/13/2024 0527 by Wonda, Worley Radermacher D, RN Outcome: Progressing 05/13/2024 0526 by Wonda, Udell Mazzocco D, RN Outcome: Progressing Goal: Ability to verbalize understanding of medication therapies will improve 05/13/2024 0527 by Wonda Earing D, RN Outcome: Progressing 05/13/2024 0526 by Wonda, Juline Sanderford D, RN Outcome: Progressing Goal: Individualized Educational Video(s) Outcome: Progressing   Problem: Activity: Goal: Capacity to carry out activities will improve 05/13/2024 0527 by Wonda Earing D, RN Outcome: Not Progressing 05/13/2024 0526 by Wonda, Shaheer Bonfield D, RN Outcome: Progressing   Problem: Cardiac: Goal: Ability to achieve and maintain adequate cardiopulmonary perfusion will improve Outcome:  Progressing

## 2024-05-13 NOTE — Progress Notes (Signed)
 Mobility Specialist Progress Note;   05/13/24 1122  Mobility  Activity Pivoted/transferred from bed to chair  Level of Assistance Contact guard assist, steadying assist  Assistive Device None  Distance Ambulated (ft) 5 ft  Activity Response Tolerated well  Mobility Referral Yes  Mobility visit 1 Mobility  Mobility Specialist Start Time (ACUTE ONLY) 1122  Mobility Specialist Stop Time (ACUTE ONLY) 1133  Mobility Specialist Time Calculation (min) (ACUTE ONLY) 11 min   Pt agreeable to mobility. Assisted pt in transferring to chair for lunch. Required MinG assistance to safely transfer. Geomat placed in chair for comfort.  VSS on 3LO2. No c/o when asked. Pt left comfortably in chair with all needs met, alarm on. NT present.   Lauraine Erm Mobility Specialist Please contact via SecureChat or [employer] [phone]

## 2024-05-14 ENCOUNTER — Ambulatory Visit: Admitting: Family Medicine

## 2024-05-14 DIAGNOSIS — B370 Candidal stomatitis: Secondary | ICD-10-CM | POA: Diagnosis not present

## 2024-05-14 DIAGNOSIS — J9621 Acute and chronic respiratory failure with hypoxia: Secondary | ICD-10-CM | POA: Diagnosis not present

## 2024-05-14 DIAGNOSIS — D72829 Elevated white blood cell count, unspecified: Secondary | ICD-10-CM | POA: Diagnosis not present

## 2024-05-14 DIAGNOSIS — I509 Heart failure, unspecified: Secondary | ICD-10-CM | POA: Diagnosis not present

## 2024-05-14 DIAGNOSIS — J698 Pneumonitis due to inhalation of other solids and liquids: Secondary | ICD-10-CM | POA: Diagnosis not present

## 2024-05-14 DIAGNOSIS — J690 Pneumonitis due to inhalation of food and vomit: Secondary | ICD-10-CM

## 2024-05-14 LAB — CBC WITH DIFFERENTIAL/PLATELET
Abs Immature Granulocytes: 0.22 K/uL — ABNORMAL HIGH (ref 0.00–0.07)
Basophils Absolute: 0.1 K/uL (ref 0.0–0.1)
Basophils Relative: 0 %
Eosinophils Absolute: 0.2 K/uL (ref 0.0–0.5)
Eosinophils Relative: 1 %
HCT: 32.1 % — ABNORMAL LOW (ref 39.0–52.0)
Hemoglobin: 10.2 g/dL — ABNORMAL LOW (ref 13.0–17.0)
Immature Granulocytes: 1 %
Lymphocytes Relative: 7 %
Lymphs Abs: 1.3 K/uL (ref 0.7–4.0)
MCH: 28.3 pg (ref 26.0–34.0)
MCHC: 31.8 g/dL (ref 30.0–36.0)
MCV: 89.2 fL (ref 80.0–100.0)
Monocytes Absolute: 1.9 K/uL — ABNORMAL HIGH (ref 0.1–1.0)
Monocytes Relative: 10 %
Neutro Abs: 14.2 K/uL — ABNORMAL HIGH (ref 1.7–7.7)
Neutrophils Relative %: 81 %
Platelets: 662 K/uL — ABNORMAL HIGH (ref 150–400)
RBC: 3.6 MIL/uL — ABNORMAL LOW (ref 4.22–5.81)
RDW: 14.2 % (ref 11.5–15.5)
WBC: 17.8 K/uL — ABNORMAL HIGH (ref 4.0–10.5)
nRBC: 0 % (ref 0.0–0.2)

## 2024-05-14 LAB — HEPATIC FUNCTION PANEL
ALT: 21 U/L (ref 0–44)
AST: 16 U/L (ref 15–41)
Albumin: 2.1 g/dL — ABNORMAL LOW (ref 3.5–5.0)
Alkaline Phosphatase: 88 U/L (ref 38–126)
Bilirubin, Direct: 0.1 mg/dL (ref 0.0–0.2)
Indirect Bilirubin: 0.3 mg/dL (ref 0.3–0.9)
Total Bilirubin: 0.4 mg/dL (ref 0.0–1.2)
Total Protein: 6.5 g/dL (ref 6.5–8.1)

## 2024-05-14 LAB — BASIC METABOLIC PANEL WITH GFR
Anion gap: 11 (ref 5–15)
BUN: 18 mg/dL (ref 8–23)
CO2: 28 mmol/L (ref 22–32)
Calcium: 8.7 mg/dL — ABNORMAL LOW (ref 8.9–10.3)
Chloride: 98 mmol/L (ref 98–111)
Creatinine, Ser: 0.68 mg/dL (ref 0.61–1.24)
GFR, Estimated: >60 mL/min (ref >60)
Glucose, Bld: 115 mg/dL — ABNORMAL HIGH (ref 70–99)
Potassium: 4.1 mmol/L (ref 3.5–5.1)
Sodium: 137 mmol/L (ref 135–145)

## 2024-05-14 LAB — GLUCOSE, CAPILLARY: Glucose-Capillary: 149 mg/dL — ABNORMAL HIGH (ref 70–99)

## 2024-05-14 NOTE — Progress Notes (Signed)
 Nutrition Follow-up  DOCUMENTATION CODES:   Severe malnutrition in context of social or environmental circumstances  INTERVENTION:  Continue Thiamine 100 mg PO daily x1 more day Continue Multivitamin PO daily Continue liberalized diet to maximize intake  Continue Glucerna Shake po TID, each supplement provides 220 kcal and 10 grams of protein    NUTRITION DIAGNOSIS:  Severe Malnutrition related to social / environmental circumstances as evidenced by percent weight loss, severe muscle depletion, moderate fat depletion.  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 Presented with SOB and cough s/p 5-tooth extraction 3-4 weeks and and found to have bronchiectasis exacerbation, and deconditioning.  Developed a fever. ID consulted with blood cultures pending. Suspect aspiration event. Patient does endorse coughing/choking on during pill administration yesterday.   Average Meal Intake 10/31: 50-80% x3 documented meals 11/01: 50% x1 documented meal 11/02: 75-100% x2 documented meals  Patient resting in bed this morning. Reports intake is improving, although he does not prefer the hospital food to his independent living facility. No issues reported chewing or swallowing with recent tooth extractions.   Admit Weight: 62 kg Current Weight: 63.9 kg  Weight stable. No edema on exam. Bowels stable after he reports them being sluggish.    Meds: doxycycline , fluconazole, furosemide , Nifrex, MVI, pantoprazole , thiamine, IV ABX  Iron low with Nifrex ordered. Electrolytes and renal function stable. WBCs trending down.   Labs:  Na+ 137  (wdl) K+ 4.1 (wdl) Iron 18 (L)  CRP 21 (89/70) WBC 28.9>17.8 (H) CBGs 115 - 126 x24 hours   A1c 5.9 (03/2024)  Diet Order:   Diet Order             Diet general           Diet regular Room service appropriate? Yes; Fluid consistency: Thin  Diet effective now             EDUCATION NEEDS:  Education needs have been addressed  Skin:  Skin Assessment: Reviewed RN Assessment  Last BM:  11/5 - type 4 x1  Height:  Ht Readings from Last 1 Encounters:  05/06/24 5' 11 (1.803 m)   Weight:  Wt Readings from Last 1 Encounters:  05/14/24 63.9 kg   Ideal Body Weight:  78 kg  BMI:  Body mass index is 19.65 kg/m.  Estimated Nutritional Needs:   Kcal:  1800-2000 kcals  Protein:  80-95g  Fluid:  1800-2000 ml/day  Blair Deaner MS, RD, LDN Registered Dietitian Clinical Nutrition RD Inpatient Contact Info in Amion

## 2024-05-14 NOTE — Progress Notes (Signed)
 Physical Therapy Treatment Patient Details Name: Matthew Hall MRN: [id number] DOB: June 25, 1946 Today's Date: 05/14/2024   History of Present Illness Pt is a 78 y.o. 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 PMH - recent CVA with R foot drop, atrial fibrillation, COPD, hypertension, vasculitis, diabetes, diabetic neuropathy, bladder cancer.    PT Comments  Pt received in supine, c/o fatigue but agreeable to therapy session with encouragement. Increased time to initiate and perform all tasks, pt tangential and needs reorientation to time/sequence of events past couple days but appears otherwise oriented. Pt needing up to minA for transfers due to decreased eccentric control to sit with fatigue but otherwise at [employer] level using Rollator. Pt continues to benefit from PT services to progress toward functional mobility goals, continue to recommend HHPT upon DC.    If plan is discharge home, recommend the following: Assistance with cooking/housework;Assist for transportation   Can travel by [employer] (4 wheels)    Recommendations for Other Services       Precautions / Restrictions Precautions Precautions: Fall Recall of Precautions/Restrictions: Intact Restrictions Weight Bearing Restrictions Per Provider Order: No     Mobility  Bed Mobility Overal bed mobility: Modified Independent             General bed mobility comments: to R EOB, increased time, HOB elevated    Transfers Overall transfer level: Needs assistance Equipment used: Rollator (4 wheels) Transfers: Sit to/from Stand Sit to Stand: Supervision, Min assist           General transfer comment: supervision to stand to locked rollator, minA for stand>sit due to decreased eccentric control when sitting.    Ambulation/Gait Ambulation/Gait assistance: Contact  guard assist, Supervision Gait Distance (Feet): 200 Feet Assistive device: Rollator (4 wheels) Gait Pattern/deviations: Step-through pattern, Decreased stride length, Decreased dorsiflexion - right, Steppage Gait velocity: decreased     General Gait Details: slow but steady pace, difficulty with obstacle negotiation, pt forgot to mention his R AFO brace so with noted R foot drop, steppage pattern to compensate. Cues for posture, forward gaze and activity pacing; Noisy signal on pulse oximeter, when pt lifting hand off RW, SpO2 92% and greater on 2L O2 [place] when good signal achieved. HR WFL.   Stairs             Wheelchair Mobility     Tilt Bed    Modified Rankin (Stroke Patients Only)       Balance Overall balance assessment: Mild deficits observed, not formally tested (reliant on AD)                                          Communication Communication Communication: No apparent difficulties  Cognition Arousal: Alert Behavior During Therapy: WFL for tasks assessed/performed   PT - Cognitive impairments: Orientation, Memory   Orientation impairments: Time                   PT - Cognition Comments: cooperative, tangential. Pt insists that he had RR called yesterday, but per chart review, this was on Monday. Pt reoriented to date/sequence of events. Following commands: Intact      Cueing Cueing Techniques: Verbal cues  Exercises      General Comments General comments (skin integrity, edema,  etc.): Pt c/o fatigue/poor sleep and requesting to nap at end of session. BP WFL in bed post-exertion.      Pertinent Vitals/Pain Pain Assessment Pain Assessment: No/denies pain    Home Living                          Prior Function            PT Goals (current goals can now be found in the care plan section) Acute Rehab PT Goals Patient Stated Goal: to feel better PT Goal Formulation: With patient Time For Goal Achievement:  05/23/24 Progress towards PT goals: Progressing toward goals    Frequency    Min 2X/week      PT Plan      Co-evaluation              AM-PAC PT 6 Clicks Mobility   Outcome Measure  Help needed turning from your back to your side while in a flat bed without using bedrails?: None Help needed moving from lying on your back to sitting on the side of a flat bed without using bedrails?: None Help needed moving to and from a bed to a chair (including a wheelchair)?: A Little Help needed standing up from a chair using your arms (e.g., wheelchair or bedside chair)?: A Little Help needed to walk in hospital room?: A Little Help needed climbing 3-5 steps with a railing? : A Little 6 Click Score: 20    End of Session Equipment Utilized During Treatment: Gait belt Activity Tolerance: Patient tolerated treatment well Patient left: in chair;with call bell/phone within reach;with chair alarm set Nurse Communication: Mobility status PT Visit Diagnosis: Unsteadiness on feet (R26.81);Muscle weakness (generalized) (M62.81);Difficulty in walking, not elsewhere classified (R26.2)     Time: 8483-8459 PT Time Calculation (min) (ACUTE ONLY): 24 min  Charges:    $Gait Training: 8-22 mins $Therapeutic Activity: 8-22 mins PT General Charges $$ ACUTE PT VISIT: 1 Visit                     Yarnell Arvidson P., PTA Acute Rehabilitation Services Secure Chat Preferred 9a-5:30pm Office: [phone]    Connell HERO [facility] 05/14/2024, 3:59 PM

## 2024-05-14 NOTE — Plan of Care (Signed)
  Problem: Education: Goal: Knowledge of General Education information will improve Description: Including pain rating scale, medication(s)/side effects and non-pharmacologic comfort measures Outcome: Progressing   Problem: Health Behavior/Discharge Planning: Goal: Ability to manage health-related needs will improve Outcome: Progressing   Problem: Clinical Measurements: Goal: Ability to maintain clinical measurements within normal limits will improve Outcome: Progressing Goal: Will remain free from infection Outcome: Progressing Goal: Diagnostic test results will improve Outcome: Progressing Goal: Respiratory complications will improve Outcome: Progressing Goal: Cardiovascular complication will be avoided Outcome: Progressing   Problem: Activity: Goal: Risk for activity intolerance will decrease Outcome: Progressing   Problem: Nutrition: Goal: Adequate nutrition will be maintained Outcome: Progressing   Problem: Coping: Goal: Level of anxiety will decrease Outcome: Progressing   Problem: Elimination: Goal: Will not experience complications related to bowel motility Outcome: Progressing Goal: Will not experience complications related to urinary retention Outcome: Progressing   Problem: Pain Managment: Goal: General experience of comfort will improve and/or be controlled Outcome: Progressing   Problem: Safety: Goal: Ability to remain free from injury will improve Outcome: Progressing   Problem: Skin Integrity: Goal: Risk for impaired skin integrity will decrease Outcome: Progressing   Problem: Education: Goal: Ability to demonstrate management of disease process will improve Outcome: Progressing Goal: Ability to verbalize understanding of medication therapies will improve Outcome: Progressing Goal: Individualized Educational Video(s) Outcome: Progressing   Problem: Activity: Goal: Capacity to carry out activities will improve Outcome: Progressing

## 2024-05-14 NOTE — Progress Notes (Addendum)
 [facility]    Date of Admission:  05/06/2024   Total days of antibiotics 3   ID: Matthew Hall is a 78 y.o. 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) Active Problems:   Acute on chronic congestive heart failure (HCC)   Protein-calorie malnutrition, severe    Subjective: Afebrile  Leukocytosis improving  Medications:   apixaban   5 mg Oral BID   budesonide -glycopyrrolate -formoterol   2 puff Inhalation BID   busPIRone   10 mg Oral BID   doxycycline   100 mg Oral Q12H   feeding supplement (GLUCERNA SHAKE)  237 mL Oral BID BM   fluconazole  200 mg Oral Daily   furosemide   20 mg Oral Daily   guaiFENesin   600 mg Oral BID   iron polysaccharides  150 mg Oral Daily   metoprolol  succinate  25 mg Oral BID   multivitamin with minerals  1 tablet Oral Daily   pantoprazole   40 mg Oral Daily   sodium chloride  flush  3 mL Intravenous Q12H   thiamine  100 mg Oral Daily   triamcinolone   1 spray Nasal Daily    Objective: Vital signs in last 24 hours: Temp:  [98 F (36.7 C)-99 F (37.2 C)] 98.2 F (36.8 C) (11/05 1100) Pulse Rate:  [68-92] 90 (11/05 1100) Resp:  [20-28] 22 (11/05 1100) BP: (100-143)/(58-74) 121/58 (11/05 1100) SpO2:  [94 %-97 %] 94 % (11/05 1100) Weight:  [63.9 kg] 63.9 kg (11/05 0400) Physical Exam  Constitutional: He is oriented to person, place, and time. He appears well-developed and well-nourished. No distress.  HENT:  Mouth/Throat: Oropharynx is clear and moist. No oropharyngeal exudate.  Cardiovascular: Normal rate, regular rhythm and normal heart sounds. Exam reveals no gallop and no friction rub.  No murmur heard.  Pulmonary/Chest: Effort normal and breath sounds normal. No respiratory distress. Mild crackles at right base  Abdominal: Soft. Bowel sounds are normal. He exhibits no distension. There is no tenderness.  Lymphadenopathy:  He has no cervical adenopathy.   Neurological: He is alert and oriented to person, place, and time.  Skin: Skin is warm and dry. No rash noted. No erythema.  Psychiatric: He has a normal mood and affect. His behavior is normal.    Lab Results Recent Labs    05/13/24 0227 05/14/24 0235  WBC 28.9* 17.8*  HGB 10.4* 10.2*  HCT 32.8* 32.1*  NA 138 137  K 4.0 4.1  CL 98 98  CO2 29 28  BUN 18 18  CREATININE 0.65 0.68   Liver Panel Recent Labs    05/14/24 0235  PROT 6.5  ALBUMIN 2.1*  AST 16  ALT 21  ALKPHOS 88  BILITOT 0.4  BILIDIR 0.1  IBILI 0.3    Microbiology: 11/3 sputum cx - pending Blood cx ngtd Studies/Results: CT MAXILLOFACIAL WO CONTRAST Result Date: 05/13/2024 EXAM: CT OF THE FACE WITHOUT CONTRAST 05/12/2024 10:16:01 PM TECHNIQUE: CT of the face was performed without the administration of intravenous contrast. Multiplanar reformatted images are provided for review. Automated exposure control, iterative reconstruction, and/or weight based adjustment of the mA/kV was utilized to reduce the radiation dose to as low as reasonably achievable. COMPARISON: CTA head and neck 10/28/2023. CLINICAL HISTORY: 78 year old male. Recent dental infection/abscess with 5 teeth removed, now with fever. FINDINGS: FACIAL BONES: Bilateral maxillary and mandible dental extractions with no adverse features identified. Underlying mandible and maxilla remain intact and aligned. Other facial  bones are stable. Chronic leftward nasal septal deviation and spurring. Bilateral paranasal sinuses, visible middle ears and mastoids remain well aerated. Calcified atherosclerosis at the skull base. Stable visible non-contrast parenchyma. ORBITS: Postoperative changes to both globes. SINUSES AND MASTOIDS: Bilateral paranasal sinuses, visible middle ears and mastoids remain well aerated. SOFT TISSUES: Negative visible non-contrast larynx, pharynx, parapharyngeal spaces, retropharyngeal space, sublingual space, submandibular spaces, parotid  spaces. Non-contrast bilateral masticator spaces also within normal limits. No soft tissue gas or inflammation is identified. No upper cervical lymphadenopathy. IMPRESSION: 1. Bilateral maxillary and mandible dental extractions with no adverse features identified. 2. No inflammatory process identified in the non-contrast face. Electronically signed by: Helayne Hurst MD 05/13/2024 04:34 AM EST RP Workstation: [id number]     Assessment/Plan: Aspiration pneumonia =appears improving. Currently on day 3 of 7. As he continues to improve, can change to keflex to complete course. Possibly change tomorrow.  Thrush = complete 7 days of fluconazole 200mg  po daily  Hx of bronchiectasis =please send afb culture looking to see if any NTM infection.  Leukocytosis = improving. Likely reactive from pneumonitis  evaluation of this patient requires complex antimicrobial therapy evaluation and counseling and isolation needs for disease transmission risk assessment and mitigation.    [facility] Infectious Diseases Pager: [phone]  05/14/2024, 2:23 PM

## 2024-05-14 NOTE — Progress Notes (Signed)
 PROGRESS NOTE    Matthew Hall  FMW:[id number] DOB: 1945/12/01 DOA: 05/06/2024 PCP: Candise [name], MD    Brief Narrative:  78 y.o. 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  Seen by pulmonology at admission and they agree it was not pneumonia/bronchiectasis most likely volume overload.  He responded very well to diuresis, returning to his home levels of oxygen -room air during the day and 2 L at night.   Assessment and Plan: Suspected acute exacerbation of diastolic heart failure Chronic hypoxic respiratory failure - 2L at night --pulm consulted considering complex underlying lung disease -Not convinced that this is a COPD exacerbation, certainly not bronchiectasis exacerbation and he does not have CAP so doubt need for antibiotics - He has improved with diuresis, has mildly elevated BNP previous echo showed EF 40 to 45% and small effusions noted on imaging, decompensated heart failure possible -- Upon admission CT chest done-no new lung consolidations, trace pleural effusions -- Received diuresis at OSH with concern for HF-have given IV Lasix  x 2 in total with close to 4 L of diuresis and improvement in breathing- will give Lasix  daily (will need to have close outpatient follow-up to make sure we are not over diuresing) -Weights not accurate as they are not standing.  -echo: EF 50 to 55%-improved from prior-- needs outpatient follow up -- ESR, CRP-done and elevated but nonspecific -- ANA, ANCA, C3/4 with his prior hx vasculitis are negative --  Continue home Breztri . saline neb BID, Duonebs (sub alb for xopenex ) q6 hr, IS, guaifenisen, OOB to chair during the day  -- wean O2 to off during the day-- only wears at night Renal function is stable.  Electrolytes are stable.   Leukocytosis plus fever/aspiration pneumonia Patient developed fever.  Infectious disease was consulted.  Blood cultures are pending.  COVID-19 influenza and RSV PCR negative.  Respiratory viral panel negative.  CT maxillofacial was negative for acute infection. Infectious disease has seen the patient and thinks that patient may have had an aspiration event.  WBC is improving. Patient is noted to be on ceftriaxone  and doxycycline . Noted to be afebrile.  Elevated troponin Minimal elevation just above reference limit < 15 -> 21 hs troponin. EKG is limited by artifact but no overt ischemic changes. And he has no component of chest pain. Suspect demand in setting of his acute respiratory process above.  -- Outpatient follow-up with cardiology   Recent dental extraction  -- No signs of complication on exam, well healed.  -Patient denies any mouth pain or discomfort CT maxillofacial does not show any osteomyelitis or abscess  Atrial fibrillation, paroxysmal Stable.  On anticoagulation with Eliquis .  Noted to be on metoprolol .   Deconditioning  -- PT evaluation -walked 300 feet on room air with PT   Hypokalemia/hypomagnesemia/hypophosphatemia -repleted   Anemia-iron deficiency -suspect related to poor po intake/blood loss from recent dental extractions -hgb stable but lower than baseline -PCP follow up - Iron p.o. for now   Nutrition Status: Nutrition Problem: Severe Malnutrition Etiology: social / environmental circumstances Signs/Symptoms: percent weight loss, severe muscle depletion, moderate fat depletion Percent weight loss: 22 % (  in one year) Interventions: Glucerna shake, MVI   DVT prophylaxis: On Eliquis    Code Status: Full Code Family  Communication: Daughter at bedside Disposition: Home with home health when improved    Consultants:  Pulmonary-signed off ID  Subjective: Feels well for the most part.  Denies any shortness of breath or chest pain this morning.  No dizziness or lightheadedness.   Objective: Vitals:   05/13/24 1922 05/13/24 2230 05/14/24 0400 05/14/24 0700  BP: (!) 143/74 (!) 116/58 109/71 100/70  Pulse: 92 83 77 68  Resp: (!) 24 (!) 24 (!) 21 20  Temp: 98.6 F (37 C) 98.3 F (36.8 C) 98 F (36.7 C) 98.1 F (36.7 C)  TempSrc: Oral Oral Oral Oral  SpO2: 94% 96% 96% 97%  Weight:   63.9 kg   Height:        Intake/Output Summary (Last 24 hours) at 05/14/2024 1005 Last data filed at 05/14/2024 0511 Gross per 24 hour  Intake 340 ml  Output 1400 ml  Net -1060 ml   Filed Weights   05/12/24 0345 05/13/24 0330 05/14/24 0400  Weight: 62.3 kg 61 kg 63.9 kg    Examination:  General appearance: Awake alert.  In no distress Resp: Clear to auscultation bilaterally.  Normal effort Cardio: S1-S2 is normal regular.  No S3-S4.  No rubs murmurs or bruit GI: Abdomen is soft.  Nontender nondistended.  Bowel sounds are present normal.  No masses organomegaly Extremities: No edema.  Full range of motion of lower extremities. Neurologic: Alert and oriented x3.  No focal neurological deficits.     Data Reviewed:   CBC: Recent Labs  Lab 05/10/24 1244 05/11/24 0152 05/12/24 0222 05/13/24 0227 05/14/24 0235  WBC 12.9* 11.9* 15.0* 28.9* 17.8*  NEUTROABS 10.0*  --   --   --  14.2*  HGB 10.0* 10.5* 10.3* 10.4* 10.2*  HCT 32.1* 33.5* 33.2* 32.8* 32.1*  MCV 90.7 89.8 89.7 88.4 89.2  PLT 579* 609* 641* 659* 662*   Basic Metabolic Panel: Recent Labs  Lab 05/09/24 0235 05/10/24 0214 05/11/24 0152 05/12/24 0222 05/13/24 0227 05/14/24 0235  NA 135 135 137 134* 138 137  K 3.5 3.8 3.6 4.1 4.0 4.1  CL 96* 98 99 99 98 98  CO2 27 28 28 26 29 28   GLUCOSE 135* 141* 137* 159* 126* 115*  BUN 16 16 16  19 18 18   CREATININE 0.84 0.87 0.77 0.83 0.65 0.68  CALCIUM  8.4* 8.2* 8.6* 8.3* 8.7* 8.7*  MG 2.0  --   --   --   --   --   PHOS 2.8  --   --   --   --   --    GFR: Estimated Creatinine Clearance: 66.6 mL/min (by C-G formula based on SCr of 0.68 mg/dL). Liver Function Tests: Recent Labs  Lab 05/14/24 0235  AST 16  ALT 21  ALKPHOS 88  BILITOT 0.4  PROT 6.5  ALBUMIN 2.1*   CBG: Recent Labs  Lab 05/10/24 0612 05/11/24 0543 05/12/24 0612 05/13/24 0403 05/14/24 0515  GLUCAP 108* 107* 95 128* 149*    Recent Results (from the past 240 hours)  Resp panel by RT-PCR (RSV, Flu A&B, Covid) Anterior Nasal Swab     Status: None   Collection Time: 05/06/24  6:16 PM   Specimen: Anterior Nasal Swab  Result Value Ref Range Status   SARS Coronavirus 2 by RT PCR NEGATIVE NEGATIVE Final    Comment: (NOTE) SARS-CoV-2 target nucleic acids are  NOT DETECTED.  The SARS-CoV-2 RNA is generally detectable in upper respiratory specimens during the acute phase of infection. The lowest concentration of SARS-CoV-2 viral copies this assay can detect is 138 copies/mL. A negative result does not preclude SARS-Cov-2 infection and should not be used as the sole basis for treatment or other patient management decisions. A negative result may occur with  improper specimen collection/handling, submission of specimen other than nasopharyngeal swab, presence of viral mutation(s) within the areas targeted by this assay, and inadequate number of viral copies(<138 copies/mL). A negative result must be combined with clinical observations, patient history, and epidemiological information. The expected result is Negative.  Fact Sheet for Patients:  bloggercourse.com  Fact Sheet for Healthcare Providers:  seriousbroker.it  This test is no t yet approved or cleared by the [place]  FDA and  has been authorized for detection and/or diagnosis of SARS-CoV-2  by FDA under an Emergency Use Authorization (EUA). This EUA will remain  in effect (meaning this test can be used) for the duration of the COVID-19 declaration under Section 564(b)(1) of the Act, 21 U.S.C.section 360bbb-3(b)(1), unless the authorization is terminated  or revoked sooner.       Influenza A by PCR NEGATIVE NEGATIVE Final   Influenza B by PCR NEGATIVE NEGATIVE Final    Comment: (NOTE) The Xpert Xpress SARS-CoV-2/FLU/RSV plus assay is intended as an aid in the diagnosis of influenza from Nasopharyngeal swab specimens and should not be used as a sole basis for treatment. Nasal washings and aspirates are unacceptable for Xpert Xpress SARS-CoV-2/FLU/RSV testing.  Fact Sheet for Patients: bloggercourse.com  Fact Sheet for Healthcare Providers: seriousbroker.it  This test is not yet approved or cleared by the [place]  FDA and has been authorized for detection and/or diagnosis of SARS-CoV-2 by FDA under an Emergency Use Authorization (EUA). This EUA will remain in effect (meaning this test can be used) for the duration of the COVID-19 declaration under Section 564(b)(1) of the Act, 21 U.S.C. section 360bbb-3(b)(1), unless the authorization is terminated or revoked.     Resp Syncytial Virus by PCR NEGATIVE NEGATIVE Final    Comment: (NOTE) Fact Sheet for Patients: bloggercourse.com  Fact Sheet for Healthcare Providers: seriousbroker.it  This test is not yet approved or cleared by the [place]  FDA and has been authorized for detection and/or diagnosis of SARS-CoV-2 by FDA under an Emergency Use Authorization (EUA). This EUA will remain in effect (meaning this test can be used) for the duration of the COVID-19 declaration under Section 564(b)(1) of the Act, 21 U.S.C. section 360bbb-3(b)(1), unless the authorization is terminated or revoked.  Performed at [facility], [address], [place], [place] [address]   Blood Culture (routine x 2)     Status: None   Collection Time: 05/06/24  6:31 PM   Specimen: BLOOD  Result Value Ref Range Status   Specimen Description   Final    BLOOD LEFT ANTECUBITAL Performed at [facility], [address], [place], [place] [address]    Special Requests   Final    Blood Culture adequate volume BOTTLES DRAWN AEROBIC AND ANAEROBIC Performed at [facility], [address] [place], [place] [address]    Culture   Final    NO GROWTH 5 DAYS Performed at [facility], [address], [place], [place] [address]    Report Status 05/11/2024 FINAL  Final  Blood Culture (routine x 2)     Status: None   Collection Time:  05/06/24  7:15 PM   Specimen: BLOOD  Result Value Ref Range Status   Specimen Description   Final    BLOOD RIGHT ANTECUBITAL Performed at [facility], [address] [place], [place] [address]    Special Requests   Final    Blood Culture adequate volume BOTTLES DRAWN AEROBIC AND ANAEROBIC Performed at [facility], [address] [place], [place] [address]    Culture   Final    NO GROWTH 5 DAYS Performed at [facility], [address] [place], [place] [address]    Report Status 05/11/2024 FINAL  Final  MRSA Next Gen by PCR, Nasal     Status: Abnormal   Collection Time: 05/06/24 10:41 PM   Specimen: Nasal Mucosa; Nasal Swab  Result Value Ref Range Status   MRSA by PCR Next Gen DETECTED (A) NOT DETECTED Final    Comment: RESULTS CALLED TO READ BACK BY AND VERIFED WITH RN M.LAYTON ON 05/07/24 AT 0021 BY NM (NOTE) The GeneXpert MRSA Assay (FDA approved for NASAL specimens only), is one component of a comprehensive MRSA colonization surveillance program. It is not intended to diagnose MRSA infection nor to guide or monitor treatment for MRSA infections. Test performance is not  FDA approved in patients less than 78 years old. Performed at [facility], [address] [place], [place] [address]   Respiratory (~20 pathogens) panel by PCR     Status: None   Collection Time: 05/06/24 11:35 PM   Specimen: Nasopharyngeal Swab; Respiratory  Result Value Ref Range Status   Adenovirus NOT DETECTED NOT DETECTED Final   Coronavirus 229E NOT DETECTED NOT DETECTED Final    Comment: (NOTE) The Coronavirus on the Respiratory Panel, DOES NOT test for the novel  Coronavirus (2019 nCoV)    Coronavirus HKU1 NOT DETECTED NOT DETECTED Final   Coronavirus NL63 NOT DETECTED NOT DETECTED Final   Coronavirus OC43 NOT DETECTED NOT DETECTED Final   Metapneumovirus NOT DETECTED NOT DETECTED Final   Rhinovirus / Enterovirus NOT DETECTED NOT DETECTED Final   Influenza A NOT DETECTED NOT DETECTED Final   Influenza B NOT DETECTED NOT DETECTED Final   Parainfluenza Virus 1 NOT DETECTED NOT DETECTED Final   Parainfluenza Virus 2 NOT DETECTED NOT DETECTED Final   Parainfluenza Virus 3 NOT DETECTED NOT DETECTED Final   Parainfluenza Virus 4 NOT DETECTED NOT DETECTED Final   Respiratory Syncytial Virus NOT DETECTED NOT DETECTED Final   Bordetella pertussis NOT DETECTED NOT DETECTED Final   Bordetella Parapertussis NOT DETECTED NOT DETECTED Final   Chlamydophila pneumoniae NOT DETECTED NOT DETECTED Final   Mycoplasma pneumoniae NOT DETECTED NOT DETECTED Final    Comment: Performed at [facility], [address], [place], [place] [address]  Respiratory (~20 pathogens) panel by PCR     Status: None   Collection Time: 05/12/24  5:06 PM   Specimen: Nasopharyngeal Swab; Respiratory  Result Value Ref Range Status   Adenovirus NOT DETECTED NOT DETECTED Final   Coronavirus 229E NOT DETECTED NOT DETECTED Final    Comment: (NOTE) The Coronavirus on the Respiratory Panel, DOES NOT test for the novel  Coronavirus (2019 nCoV)    Coronavirus HKU1 NOT DETECTED NOT DETECTED Final    Coronavirus NL63 NOT DETECTED NOT DETECTED Final   Coronavirus OC43 NOT DETECTED NOT DETECTED Final   Metapneumovirus NOT DETECTED NOT DETECTED Final   Rhinovirus / Enterovirus NOT DETECTED NOT DETECTED Final   Influenza A NOT DETECTED  NOT DETECTED Final   Influenza B NOT DETECTED NOT DETECTED Final   Parainfluenza Virus 1 NOT DETECTED NOT DETECTED Final   Parainfluenza Virus 2 NOT DETECTED NOT DETECTED Final   Parainfluenza Virus 3 NOT DETECTED NOT DETECTED Final   Parainfluenza Virus 4 NOT DETECTED NOT DETECTED Final   Respiratory Syncytial Virus NOT DETECTED NOT DETECTED Final   Bordetella pertussis NOT DETECTED NOT DETECTED Final   Bordetella Parapertussis NOT DETECTED NOT DETECTED Final   Chlamydophila pneumoniae NOT DETECTED NOT DETECTED Final   Mycoplasma pneumoniae NOT DETECTED NOT DETECTED Final    Comment: Performed at [facility], [address], [place], [place] [address]  Resp panel by RT-PCR (RSV, Flu A&B, Covid)     Status: None   Collection Time: 05/12/24  6:07 PM  Result Value Ref Range Status   SARS Coronavirus 2 by RT PCR NEGATIVE NEGATIVE Final   Influenza A by PCR NEGATIVE NEGATIVE Final   Influenza B by PCR NEGATIVE NEGATIVE Final    Comment: (NOTE) The Xpert Xpress SARS-CoV-2/FLU/RSV plus assay is intended as an aid in the diagnosis of influenza from Nasopharyngeal swab specimens and should not be used as a sole basis for treatment. Nasal washings and aspirates are unacceptable for Xpert Xpress SARS-CoV-2/FLU/RSV testing.  Fact Sheet for Patients: bloggercourse.com  Fact Sheet for Healthcare Providers: seriousbroker.it  This test is not yet approved or cleared by the [place]  FDA and has been authorized for detection and/or diagnosis of SARS-CoV-2 by FDA under an Emergency Use Authorization (EUA). This EUA will remain in effect (meaning this test can be used) for the duration of the COVID-19  declaration under Section 564(b)(1) of the Act, 21 U.S.C. section 360bbb-3(b)(1), unless the authorization is terminated or revoked.     Resp Syncytial Virus by PCR NEGATIVE NEGATIVE Final    Comment: (NOTE) Fact Sheet for Patients: bloggercourse.com  Fact Sheet for Healthcare Providers: seriousbroker.it  This test is not yet approved or cleared by the [place]  FDA and has been authorized for detection and/or diagnosis of SARS-CoV-2 by FDA under an Emergency Use Authorization (EUA). This EUA will remain in effect (meaning this test can be used) for the duration of the COVID-19 declaration under Section 564(b)(1) of the Act, 21 U.S.C. section 360bbb-3(b)(1), unless the authorization is terminated or revoked.  Performed at [facility], [address], [place], [place] [address]   Culture, blood (Routine X 2) w Reflex to ID Panel     Status: None (Preliminary result)   Collection Time: 05/12/24  6:31 PM   Specimen: BLOOD  Result Value Ref Range Status   Specimen Description BLOOD SITE NOT SPECIFIED  Final   Special Requests   Final    BOTTLES DRAWN AEROBIC AND ANAEROBIC Blood Culture results may not be optimal due to an inadequate volume of blood received in culture bottles   Culture   Final    NO GROWTH 2 DAYS Performed at [facility], [address] [place], [place] [address]    Report Status PENDING  Incomplete  Culture, blood (Routine X 2) w Reflex to ID Panel     Status: None (Preliminary result)   Collection Time: 05/12/24  6:33 PM   Specimen: BLOOD RIGHT HAND  Result Value Ref Range Status   Specimen Description BLOOD RIGHT HAND  Final   Special Requests   Final    BOTTLES DRAWN AEROBIC AND ANAEROBIC Blood Culture adequate volume   Culture  Final    NO GROWTH 2 DAYS Performed at [facility], [address], [place], [place] [address]    Report Status PENDING  Incomplete  Expectorated  Sputum Assessment w Gram Stain, Rflx to Resp Cult     Status: None   Collection Time: 05/12/24  8:30 PM   Specimen: Expectorated Sputum  Result Value Ref Range Status   Specimen Description EXPECTORATED SPUTUM  Final   Special Requests Normal  Final   Sputum evaluation   Final    THIS SPECIMEN IS ACCEPTABLE FOR SPUTUM CULTURE Performed at [facility], [address], [place], [place] [address]    Report Status 05/12/2024 FINAL  Final  Culture, Respiratory w Gram Stain     Status: None (Preliminary result)   Collection Time: 05/12/24  8:30 PM  Result Value Ref Range Status   Specimen Description EXPECTORATED SPUTUM  Final   Special Requests Normal Reflexed from [id number]  Final   Gram Stain   Final    FEW WBC PRESENT,BOTH PMN AND MONONUCLEAR MODERATE GRAM NEGATIVE RODS FEW GRAM POSITIVE COCCI    Culture   Final    CULTURE REINCUBATED FOR BETTER GROWTH Performed at [facility], [address] [place], [place] [address]    Report Status PENDING  Incomplete         Radiology Studies: CT MAXILLOFACIAL WO CONTRAST Result Date: 05/13/2024 EXAM: CT OF THE FACE WITHOUT CONTRAST 05/12/2024 10:16:01 PM TECHNIQUE: CT of the face was performed without the administration of intravenous contrast. Multiplanar reformatted images are provided for review. Automated exposure control, iterative reconstruction, and/or weight based adjustment of the mA/kV was utilized to reduce the radiation dose to as low as reasonably achievable. COMPARISON: CTA head and neck 10/28/2023. CLINICAL HISTORY: 78 year old male. Recent dental infection/abscess with 5 teeth removed, now with fever. FINDINGS: FACIAL BONES: Bilateral maxillary and mandible dental extractions with no adverse features identified. Underlying mandible and maxilla remain intact and aligned. Other facial bones are stable. Chronic leftward nasal septal deviation and spurring. Bilateral paranasal sinuses, visible middle ears and mastoids  remain well aerated. Calcified atherosclerosis at the skull base. Stable visible non-contrast parenchyma. ORBITS: Postoperative changes to both globes. SINUSES AND MASTOIDS: Bilateral paranasal sinuses, visible middle ears and mastoids remain well aerated. SOFT TISSUES: Negative visible non-contrast larynx, pharynx, parapharyngeal spaces, retropharyngeal space, sublingual space, submandibular spaces, parotid spaces. Non-contrast bilateral masticator spaces also within normal limits. No soft tissue gas or inflammation is identified. No upper cervical lymphadenopathy. IMPRESSION: 1. Bilateral maxillary and mandible dental extractions with no adverse features identified. 2. No inflammatory process identified in the non-contrast face. Electronically signed by: Helayne Hurst MD 05/13/2024 04:34 AM EST RP Workstation: [id number]    Scheduled Meds:  apixaban   5 mg Oral BID   budesonide -glycopyrrolate -formoterol   2 puff Inhalation BID   busPIRone   10 mg Oral BID   doxycycline   100 mg Oral Q12H   feeding supplement (GLUCERNA SHAKE)  237 mL Oral BID BM   fluconazole  200 mg Oral Daily   furosemide   20 mg Oral Daily   guaiFENesin   600 mg Oral BID   iron polysaccharides  150 mg Oral Daily   metoprolol  succinate  25 mg Oral BID   multivitamin with minerals  1 tablet Oral Daily   pantoprazole   40 mg Oral Daily   sodium chloride  flush  3 mL Intravenous Q12H   thiamine  100 mg Oral Daily   triamcinolone   1 spray  Nasal Daily   Continuous Infusions:  cefTRIAXone  (ROCEPHIN )  IV 2 g (05/14/24 0955)     LOS: 8 days   Joette Pebbles, [facility] Available via Epic secure chat 7am-7pm After these hours, please refer to coverage provider listed on [email or web address] 05/14/2024, 10:05 AM

## 2024-05-15 DIAGNOSIS — J9621 Acute and chronic respiratory failure with hypoxia: Secondary | ICD-10-CM | POA: Diagnosis not present

## 2024-05-15 LAB — BASIC METABOLIC PANEL WITH GFR
Anion gap: 11 (ref 5–15)
BUN: 20 mg/dL (ref 8–23)
CO2: 27 mmol/L (ref 22–32)
Calcium: 8.7 mg/dL — ABNORMAL LOW (ref 8.9–10.3)
Chloride: 97 mmol/L — ABNORMAL LOW (ref 98–111)
Creatinine, Ser: 0.6 mg/dL — ABNORMAL LOW (ref 0.61–1.24)
GFR, Estimated: >60 mL/min (ref >60)
Glucose, Bld: 128 mg/dL — ABNORMAL HIGH (ref 70–99)
Potassium: 4.1 mmol/L (ref 3.5–5.1)
Sodium: 135 mmol/L (ref 135–145)

## 2024-05-15 LAB — CBC
HCT: 31.8 % — ABNORMAL LOW (ref 39.0–52.0)
Hemoglobin: 9.9 g/dL — ABNORMAL LOW (ref 13.0–17.0)
MCH: 28 pg (ref 26.0–34.0)
MCHC: 31.1 g/dL (ref 30.0–36.0)
MCV: 90.1 fL (ref 80.0–100.0)
Platelets: 688 K/uL — ABNORMAL HIGH (ref 150–400)
RBC: 3.53 MIL/uL — ABNORMAL LOW (ref 4.22–5.81)
RDW: 14.2 % (ref 11.5–15.5)
WBC: 15.8 K/uL — ABNORMAL HIGH (ref 4.0–10.5)
nRBC: 0 % (ref 0.0–0.2)

## 2024-05-15 LAB — ACID FAST SMEAR (AFB, MYCOBACTERIA): Acid Fast Smear: NEGATIVE

## 2024-05-15 LAB — GLUCOSE, CAPILLARY: Glucose-Capillary: 132 mg/dL — ABNORMAL HIGH (ref 70–99)

## 2024-05-15 MED ORDER — CEPHALEXIN 500 MG PO CAPS
500.0000 mg | ORAL_CAPSULE | Freq: Four times a day (QID) | ORAL | Status: DC
  Administered 2024-05-15 – 2024-05-17 (×9): 500 mg via ORAL
  Filled 2024-05-15 (×10): qty 1

## 2024-05-15 NOTE — Progress Notes (Signed)
 PROGRESS NOTE    Matthew Hall  FMW:[id number] DOB: 1945-07-23 DOA: 05/06/2024 PCP: Candise [name], MD    Brief Narrative:  78 y.o. 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  Seen by pulmonology at admission and they agree it was not pneumonia/bronchiectasis most likely volume overload.  He responded very well to diuresis, returning to his home levels of oxygen -room air during the day and 2 L at night.   Assessment and Plan: Suspected acute exacerbation of diastolic heart failure Chronic hypoxic respiratory failure - 2L at night --pulm consulted considering complex underlying lung disease -Not convinced that this is a COPD exacerbation, certainly not bronchiectasis exacerbation and he does not have CAP so doubt need for antibiotics - He has improved with diuresis, has mildly elevated BNP previous echo showed EF 40 to 45% and small effusions noted on imaging, decompensated heart failure possible -- Upon admission CT chest done-no new lung consolidations, trace pleural effusions -- Received diuresis at OSH with concern for HF.  Currently on 20 mg of furosemide  daily.  -echo: EF 50 to 55%-improved from prior-- needs outpatient follow up -- ESR, CRP-done and elevated but nonspecific -- ANA, ANCA, C3/4 with his prior hx vasculitis are negative -- Continue home Breztri . saline neb BID, Duonebs (sub alb for xopenex ) q6 hr, IS, guaifenisen, OOB to chair during the day  -- wean O2 to off during the day-- only wears at night Respiratory status has  improved.   Leukocytosis plus fever/aspiration pneumonia Patient developed fever.  Infectious disease was consulted.  Blood cultures are negative so far.  COVID-19 influenza and RSV PCR negative.  Respiratory viral panel negative.  CT maxillofacial was negative for acute infection. Infectious disease has seen the patient and thinks that patient may have had an aspiration event.   Patient was initially treated with ceftriaxone  and doxycycline .  Changed over to cephalexin by ID today.  Remains afebrile with improvement in WBC.  Elevated troponin Minimal elevation just above reference limit < 15 -> 21 hs troponin. EKG is limited by artifact but no overt ischemic changes. And he has no component of chest pain. Suspect demand in setting of his acute respiratory process above.  -- Outpatient follow-up with cardiology   Recent dental extraction  -- No signs of complication on exam, well healed.  -Patient denies any mouth pain or discomfort CT maxillofacial does not show any osteomyelitis or abscess  Atrial fibrillation, paroxysmal Stable.  On anticoagulation with Eliquis .  Noted to be on metoprolol .   Deconditioning  -- PT evaluation -walked 300 feet on room air with PT   Hypokalemia/hypomagnesemia/hypophosphatemia -repleted   Anemia-iron deficiency -suspect related to poor po intake/blood loss from recent dental extractions -hgb stable but lower than baseline -PCP follow up - Iron p.o. for now   Nutrition Status: Nutrition Problem: Severe Malnutrition Etiology: social / environmental circumstances Signs/Symptoms: percent weight loss, severe muscle depletion, moderate fat depletion Percent weight loss: 22 % (in one year) Interventions: Glucerna shake, MVI   DVT prophylaxis: On Eliquis    Code Status: Full Code Family Communication: No family at bedside.  Discussed with patient.  Will  update daughter later today. Disposition: Home with home health when improved    Consultants:   Pulmonary-signed off ID  Subjective: Patient feels well.  No complaints offered this morning.   Objective: Vitals:   05/14/24 2336 05/15/24 0349 05/15/24 0716 05/15/24 0817  BP: 115/76 113/80 (!) 128/58   Pulse: 78 83 81   Resp: (!) 27 (!) 25 20 20   Temp: 99.1 F (37.3 C) 98.1 F (36.7 C) 98.3 F (36.8 C)   TempSrc: Oral Oral Oral   SpO2: 94% 92% 92%   Weight:      Height:        Intake/Output Summary (Last 24 hours) at 05/15/2024 0929 Last data filed at 05/15/2024 0352 Gross per 24 hour  Intake 240 ml  Output 1050 ml  Net -810 ml   Filed Weights   05/12/24 0345 05/13/24 0330 05/14/24 0400  Weight: 62.3 kg 61 kg 63.9 kg    Examination:  General appearance: Awake alert.  In no distress Resp: Normal effort at rest.  Coarse breath sound bilaterally with few crackles at the bases.  No wheezing or rhonchi. Cardio: S1-S2 is normal regular.  No S3-S4.  No rubs murmurs or bruit GI: Abdomen is soft.  Nontender nondistended.  Bowel sounds are present normal.  No masses organomegaly Extremities: No edema.  Full range of motion of lower extremities. Neurologic: Alert and oriented x3.  No focal neurological deficits.     Data Reviewed:   CBC: Recent Labs  Lab 05/10/24 1244 05/11/24 0152 05/12/24 0222 05/13/24 0227 05/14/24 0235 05/15/24 0218  WBC 12.9* 11.9* 15.0* 28.9* 17.8* 15.8*  NEUTROABS 10.0*  --   --   --  14.2*  --   HGB 10.0* 10.5* 10.3* 10.4* 10.2* 9.9*  HCT 32.1* 33.5* 33.2* 32.8* 32.1* 31.8*  MCV 90.7 89.8 89.7 88.4 89.2 90.1  PLT 579* 609* 641* 659* 662* 688*   Basic Metabolic Panel: Recent Labs  Lab 05/09/24 0235 05/10/24 0214 05/11/24 0152 05/12/24 0222 05/13/24 0227 05/14/24 0235 05/15/24 0218  NA 135   < > 137 134* 138 137 135  K 3.5   < > 3.6 4.1 4.0 4.1 4.1  CL 96*   < > 99 99 98 98 97*  CO2 27   < > 28 26 29 28 27   GLUCOSE 135*   < > 137* 159* 126* 115* 128*  BUN 16   < > 16 19 18 18 20   CREATININE 0.84   < > 0.77 0.83 0.65 0.68  0.60*  CALCIUM  8.4*   < > 8.6* 8.3* 8.7* 8.7* 8.7*  MG 2.0  --   --   --   --   --   --   PHOS 2.8  --   --   --   --   --   --    < > = values in this interval not displayed.   GFR: Estimated Creatinine Clearance: 66.6 mL/min (A) (by C-G formula based on SCr of 0.6 mg/dL (L)). Liver Function Tests: Recent Labs  Lab 05/14/24 0235  AST 16  ALT 21  ALKPHOS 88  BILITOT 0.4  PROT 6.5  ALBUMIN 2.1*   CBG: Recent Labs  Lab 05/11/24 0543 05/12/24 0612 05/13/24 0403 05/14/24 0515 05/15/24 0355  GLUCAP 107* 95 128* 149* 132*    Recent Results (from the past 240 hours)  Resp panel by RT-PCR (RSV, Flu A&B, Covid) Anterior Nasal Swab     Status: None   Collection Time:  05/06/24  6:16 PM   Specimen: Anterior Nasal Swab  Result Value Ref Range Status   SARS Coronavirus 2 by RT PCR NEGATIVE NEGATIVE Final    Comment: (NOTE) SARS-CoV-2 target nucleic acids are NOT DETECTED.  The SARS-CoV-2 RNA is generally detectable in upper respiratory specimens during the acute phase of infection. The lowest concentration of SARS-CoV-2 viral copies this assay can detect is 138 copies/mL. A negative result does not preclude SARS-Cov-2 infection and should not be used as the sole basis for treatment or other patient management decisions. A negative result may occur with  improper specimen collection/handling, submission of specimen other than nasopharyngeal swab, presence of viral mutation(s) within the areas targeted by this assay, and inadequate number of viral copies(<138 copies/mL). A negative result must be combined with clinical observations, patient history, and epidemiological information. The expected result is Negative.  Fact Sheet for Patients:  bloggercourse.com  Fact Sheet for Healthcare Providers:  seriousbroker.it  This test is no t yet approved or cleared by the [place]  FDA and  has been authorized for detection and/or  diagnosis of SARS-CoV-2 by FDA under an Emergency Use Authorization (EUA). This EUA will remain  in effect (meaning this test can be used) for the duration of the COVID-19 declaration under Section 564(b)(1) of the Act, 21 U.S.C.section 360bbb-3(b)(1), unless the authorization is terminated  or revoked sooner.       Influenza A by PCR NEGATIVE NEGATIVE Final   Influenza B by PCR NEGATIVE NEGATIVE Final    Comment: (NOTE) The Xpert Xpress SARS-CoV-2/FLU/RSV plus assay is intended as an aid in the diagnosis of influenza from Nasopharyngeal swab specimens and should not be used as a sole basis for treatment. Nasal washings and aspirates are unacceptable for Xpert Xpress SARS-CoV-2/FLU/RSV testing.  Fact Sheet for Patients: bloggercourse.com  Fact Sheet for Healthcare Providers: seriousbroker.it  This test is not yet approved or cleared by the [place]  FDA and has been authorized for detection and/or diagnosis of SARS-CoV-2 by FDA under an Emergency Use Authorization (EUA). This EUA will remain in effect (meaning this test can be used) for the duration of the COVID-19 declaration under Section 564(b)(1) of the Act, 21 U.S.C. section 360bbb-3(b)(1), unless the authorization is terminated or revoked.     Resp Syncytial Virus by PCR NEGATIVE NEGATIVE Final    Comment: (NOTE) Fact Sheet for Patients: bloggercourse.com  Fact Sheet for Healthcare Providers: seriousbroker.it  This test is not yet approved or cleared by the [place]  FDA and has been authorized for detection and/or diagnosis of SARS-CoV-2 by FDA under an Emergency Use Authorization (EUA). This EUA will remain in effect (meaning this test can be used) for the duration of the COVID-19 declaration under Section 564(b)(1) of the Act, 21 U.S.C. section 360bbb-3(b)(1), unless the authorization is terminated  or revoked.  Performed at [facility], [address], [place], [place] [address]   Blood Culture (routine x 2)     Status: None   Collection Time: 05/06/24  6:31 PM   Specimen: BLOOD  Result Value Ref Range Status   Specimen Description   Final    BLOOD LEFT ANTECUBITAL Performed at [facility], [address], [place], [place] [address]    Special Requests   Final    Blood Culture adequate volume BOTTLES DRAWN AEROBIC AND ANAEROBIC Performed at [facility], [address] [place], [place] [address]    Culture   Final    NO GROWTH 5 DAYS Performed  at [facility], [address], [place], [place] [address]    Report Status 05/11/2024 FINAL  Final  Blood Culture (routine x 2)     Status: None   Collection Time: 05/06/24  7:15 PM   Specimen: BLOOD  Result Value Ref Range Status   Specimen Description   Final    BLOOD RIGHT ANTECUBITAL Performed at [facility], [address], [place], [place] [address]    Special Requests   Final    Blood Culture adequate volume BOTTLES DRAWN AEROBIC AND ANAEROBIC Performed at [facility], [address], [place], [place] [address]    Culture   Final    NO GROWTH 5 DAYS Performed at [facility], [address], [place], [place] [address]    Report Status 05/11/2024 FINAL  Final  MRSA Next Gen by PCR, Nasal     Status: Abnormal   Collection Time: 05/06/24 10:41 PM   Specimen: Nasal Mucosa; Nasal Swab  Result Value Ref Range Status   MRSA by PCR Next Gen DETECTED (A) NOT DETECTED Final    Comment: RESULTS CALLED TO READ BACK BY AND VERIFED WITH RN M.LAYTON ON 05/07/24 AT 0021 BY NM (NOTE) The GeneXpert MRSA Assay (FDA approved for NASAL specimens only), is one component of a comprehensive MRSA colonization surveillance program. It is not intended to diagnose MRSA infection nor to guide or monitor treatment for MRSA  infections. Test performance is not FDA approved in patients less than 20 years old. Performed at [facility], [address] [place], [place] [address]   Respiratory (~20 pathogens) panel by PCR     Status: None   Collection Time: 05/06/24 11:35 PM   Specimen: Nasopharyngeal Swab; Respiratory  Result Value Ref Range Status   Adenovirus NOT DETECTED NOT DETECTED Final   Coronavirus 229E NOT DETECTED NOT DETECTED Final    Comment: (NOTE) The Coronavirus on the Respiratory Panel, DOES NOT test for the novel  Coronavirus (2019 nCoV)    Coronavirus HKU1 NOT DETECTED NOT DETECTED Final   Coronavirus NL63 NOT DETECTED NOT DETECTED Final   Coronavirus OC43 NOT DETECTED NOT DETECTED Final   Metapneumovirus NOT DETECTED NOT DETECTED Final   Rhinovirus / Enterovirus NOT DETECTED NOT DETECTED Final   Influenza A NOT DETECTED NOT DETECTED Final   Influenza B NOT DETECTED NOT DETECTED Final   Parainfluenza Virus 1 NOT DETECTED NOT DETECTED Final   Parainfluenza Virus 2 NOT DETECTED NOT DETECTED Final   Parainfluenza Virus 3 NOT DETECTED NOT DETECTED Final   Parainfluenza Virus 4 NOT DETECTED NOT DETECTED Final   Respiratory Syncytial Virus NOT DETECTED NOT DETECTED Final   Bordetella pertussis NOT DETECTED NOT DETECTED Final   Bordetella Parapertussis NOT DETECTED NOT DETECTED Final   Chlamydophila pneumoniae NOT DETECTED NOT DETECTED Final   Mycoplasma pneumoniae NOT DETECTED NOT DETECTED Final    Comment: Performed at [facility], [address] [place], [place] [address]  Respiratory (~20 pathogens) panel by PCR     Status: None   Collection Time: 05/12/24  5:06 PM   Specimen: Nasopharyngeal Swab; Respiratory  Result Value Ref Range Status   Adenovirus NOT DETECTED NOT DETECTED Final   Coronavirus 229E NOT DETECTED NOT DETECTED Final    Comment: (NOTE) The Coronavirus on the Respiratory Panel, DOES NOT test for the novel  Coronavirus (2019 nCoV)    Coronavirus HKU1 NOT  DETECTED NOT DETECTED Final   Coronavirus NL63 NOT  DETECTED NOT DETECTED Final   Coronavirus OC43 NOT DETECTED NOT DETECTED Final   Metapneumovirus NOT DETECTED NOT DETECTED Final   Rhinovirus / Enterovirus NOT DETECTED NOT DETECTED Final   Influenza A NOT DETECTED NOT DETECTED Final   Influenza B NOT DETECTED NOT DETECTED Final   Parainfluenza Virus 1 NOT DETECTED NOT DETECTED Final   Parainfluenza Virus 2 NOT DETECTED NOT DETECTED Final   Parainfluenza Virus 3 NOT DETECTED NOT DETECTED Final   Parainfluenza Virus 4 NOT DETECTED NOT DETECTED Final   Respiratory Syncytial Virus NOT DETECTED NOT DETECTED Final   Bordetella pertussis NOT DETECTED NOT DETECTED Final   Bordetella Parapertussis NOT DETECTED NOT DETECTED Final   Chlamydophila pneumoniae NOT DETECTED NOT DETECTED Final   Mycoplasma pneumoniae NOT DETECTED NOT DETECTED Final    Comment: Performed at [facility], [address], [place], [place] [address]  Resp panel by RT-PCR (RSV, Flu A&B, Covid)     Status: None   Collection Time: 05/12/24  6:07 PM  Result Value Ref Range Status   SARS Coronavirus 2 by RT PCR NEGATIVE NEGATIVE Final   Influenza A by PCR NEGATIVE NEGATIVE Final   Influenza B by PCR NEGATIVE NEGATIVE Final    Comment: (NOTE) The Xpert Xpress SARS-CoV-2/FLU/RSV plus assay is intended as an aid in the diagnosis of influenza from Nasopharyngeal swab specimens and should not be used as a sole basis for treatment. Nasal washings and aspirates are unacceptable for Xpert Xpress SARS-CoV-2/FLU/RSV testing.  Fact Sheet for Patients: bloggercourse.com  Fact Sheet for Healthcare Providers: seriousbroker.it  This test is not yet approved or cleared by the [place]  FDA and has been authorized for detection and/or diagnosis of SARS-CoV-2 by FDA under an Emergency Use Authorization (EUA). This EUA will remain in effect (meaning this test can be used) for  the duration of the COVID-19 declaration under Section 564(b)(1) of the Act, 21 U.S.C. section 360bbb-3(b)(1), unless the authorization is terminated or revoked.     Resp Syncytial Virus by PCR NEGATIVE NEGATIVE Final    Comment: (NOTE) Fact Sheet for Patients: bloggercourse.com  Fact Sheet for Healthcare Providers: seriousbroker.it  This test is not yet approved or cleared by the [place]  FDA and has been authorized for detection and/or diagnosis of SARS-CoV-2 by FDA under an Emergency Use Authorization (EUA). This EUA will remain in effect (meaning this test can be used) for the duration of the COVID-19 declaration under Section 564(b)(1) of the Act, 21 U.S.C. section 360bbb-3(b)(1), unless the authorization is terminated or revoked.  Performed at [facility], [address] [place], [place] [address]   Culture, blood (Routine X 2) w Reflex to ID Panel     Status: None (Preliminary result)   Collection Time: 05/12/24  6:31 PM   Specimen: BLOOD  Result Value Ref Range Status   Specimen Description BLOOD SITE NOT SPECIFIED  Final   Special Requests   Final    BOTTLES DRAWN AEROBIC AND ANAEROBIC Blood Culture results may not be optimal due to an inadequate volume of blood received in culture bottles   Culture   Final    NO GROWTH 3 DAYS Performed at [facility], [address], [place], [place] [address]    Report Status PENDING  Incomplete  Culture, blood (Routine X 2) w Reflex to ID Panel     Status: None (Preliminary result)   Collection Time: 05/12/24  6:33 PM   Specimen: BLOOD RIGHT HAND  Result  Value Ref Range Status   Specimen Description BLOOD RIGHT HAND  Final   Special Requests   Final    BOTTLES DRAWN AEROBIC AND ANAEROBIC Blood Culture adequate volume   Culture   Final    NO GROWTH 3 DAYS Performed at [facility], [address], [place], [place] [address]    Report Status PENDING   Incomplete  Expectorated Sputum Assessment w Gram Stain, Rflx to Resp Cult     Status: None   Collection Time: 05/12/24  8:30 PM   Specimen: Expectorated Sputum  Result Value Ref Range Status   Specimen Description EXPECTORATED SPUTUM  Final   Special Requests Normal  Final   Sputum evaluation   Final    THIS SPECIMEN IS ACCEPTABLE FOR SPUTUM CULTURE Performed at [facility], [address], [place], [place] [address]    Report Status 05/12/2024 FINAL  Final  Culture, Respiratory w Gram Stain     Status: None (Preliminary result)   Collection Time: 05/12/24  8:30 PM  Result Value Ref Range Status   Specimen Description EXPECTORATED SPUTUM  Final   Special Requests Normal Reflexed from [id number]  Final   Gram Stain   Final    FEW WBC PRESENT,BOTH PMN AND MONONUCLEAR MODERATE GRAM NEGATIVE RODS FEW GRAM POSITIVE COCCI    Culture   Final    CULTURE REINCUBATED FOR BETTER GROWTH Performed at [facility], [address] [place], [place] [address]    Report Status PENDING  Incomplete      Radiology Studies: No results found.   Scheduled Meds:  apixaban   5 mg Oral BID   budesonide -glycopyrrolate -formoterol   2 puff Inhalation BID   busPIRone   10 mg Oral BID   cephALEXin  500 mg Oral QID   feeding supplement (GLUCERNA SHAKE)  237 mL Oral BID BM   fluconazole  200 mg Oral Daily   furosemide   20 mg Oral Daily   guaiFENesin   600 mg Oral BID   iron polysaccharides  150 mg Oral Daily   metoprolol  succinate  25 mg Oral BID   multivitamin with minerals  1 tablet Oral Daily   pantoprazole   40 mg Oral Daily   sodium chloride  flush  3 mL Intravenous Q12H   thiamine  100 mg Oral Daily   triamcinolone   1 spray Nasal Daily   Continuous Infusions:     LOS: 9 days   Shanikia [employer], [facility] Available via Epic secure chat 7am-7pm After these hours, please refer to coverage provider listed on [email or web address] 05/15/2024, 9:29 AM

## 2024-05-15 NOTE — Plan of Care (Signed)

## 2024-05-15 NOTE — Plan of Care (Signed)

## 2024-05-15 NOTE — Progress Notes (Signed)
 Mobility Specialist Progress Note;   05/15/24 0920  Mobility  Activity Ambulated with assistance  Level of Assistance Contact guard assist, steadying assist  Assistive Device Four wheel walker  Distance Ambulated (ft) 150 ft  Activity Response Tolerated well  Mobility Referral Yes  Mobility visit 1 Mobility  Mobility Specialist Start Time (ACUTE ONLY) 0920  Mobility Specialist Stop Time (ACUTE ONLY) 0941  Mobility Specialist Time Calculation (min) (ACUTE ONLY) 21 min   Pt agreeable to mobility. Required MinG assistance during ambulation for safety. Ambulated on 1LO2 to maintain SPO2 92%>. No c/o when asked. Pt returned back to bed and left with all needs met, alarm on.   Lauraine Erm Mobility Specialist Please contact via SecureChat or [employer] [phone]

## 2024-05-16 DIAGNOSIS — J9621 Acute and chronic respiratory failure with hypoxia: Secondary | ICD-10-CM | POA: Diagnosis not present

## 2024-05-16 LAB — CBC
HCT: 31.6 % — ABNORMAL LOW (ref 39.0–52.0)
Hemoglobin: 9.7 g/dL — ABNORMAL LOW (ref 13.0–17.0)
MCH: 27.6 pg (ref 26.0–34.0)
MCHC: 30.7 g/dL (ref 30.0–36.0)
MCV: 89.8 fL (ref 80.0–100.0)
Platelets: 728 K/uL — ABNORMAL HIGH (ref 150–400)
RBC: 3.52 MIL/uL — ABNORMAL LOW (ref 4.22–5.81)
RDW: 14.1 % (ref 11.5–15.5)
WBC: 15 K/uL — ABNORMAL HIGH (ref 4.0–10.5)
nRBC: 0 % (ref 0.0–0.2)

## 2024-05-16 LAB — ACID FAST SMEAR (AFB, MYCOBACTERIA): Acid Fast Smear: NEGATIVE

## 2024-05-16 LAB — BASIC METABOLIC PANEL WITH GFR
Anion gap: 12 (ref 5–15)
BUN: 16 mg/dL (ref 8–23)
CO2: 27 mmol/L (ref 22–32)
Calcium: 8.9 mg/dL (ref 8.9–10.3)
Chloride: 96 mmol/L — ABNORMAL LOW (ref 98–111)
Creatinine, Ser: 0.7 mg/dL (ref 0.61–1.24)
GFR, Estimated: >60 mL/min (ref >60)
Glucose, Bld: 125 mg/dL — ABNORMAL HIGH (ref 70–99)
Potassium: 3.9 mmol/L (ref 3.5–5.1)
Sodium: 135 mmol/L (ref 135–145)

## 2024-05-16 LAB — GLUCOSE, CAPILLARY: Glucose-Capillary: 98 mg/dL (ref 70–99)

## 2024-05-16 MED ORDER — SODIUM CHLORIDE 0.45 % IV SOLN: INTRAVENOUS | Status: AC

## 2024-05-16 NOTE — Plan of Care (Signed)
   Problem: Health Behavior/Discharge Planning: Goal: Ability to manage health-related needs will improve Outcome: Progressing   Problem: Clinical Measurements: Goal: Will remain [place] from infection Outcome: Progressing   Problem: Activity: Goal: Risk for activity intolerance will decrease Outcome: Progressing   Problem: Nutrition: Goal: Adequate nutrition will be maintained Outcome: Progressing

## 2024-05-16 NOTE — Progress Notes (Addendum)
 PROGRESS NOTE    Matthew Hall  FMW:[id number] DOB: 02-11-1946 DOA: 05/06/2024 PCP: Candise [name], MD    Brief Narrative:  78 y.o. 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  Seen by pulmonology at admission and they agree it was not pneumonia/bronchiectasis most likely volume overload.  He responded very well to diuresis, returning to his home levels of oxygen -room air during the day and 2 L at night.   Assessment and Plan: Suspected acute exacerbation of diastolic heart failure Chronic hypoxic respiratory failure - 2L at night --pulm consulted considering complex underlying lung disease -Not convinced that this is a COPD exacerbation, certainly not bronchiectasis exacerbation. -- Upon admission CT chest done-no new lung consolidations, trace pleural effusions -- Received diuresis at OSH with concern for HF.  Currently on 20 mg of furosemide  daily.  -echo: EF 50 to 55%-improved from prior-- needs outpatient follow up -- ESR, CRP-done and elevated but nonspecific -- ANA, ANCA, C3/4 with his prior hx vasculitis are negative -- Continue home Breztri . saline neb BID, Duonebs (sub alb for xopenex ) q6 hr, IS, guaifenisen, OOB to chair during the day  -- wean O2 to off during the day-- only wears at night Respiratory status has improved.   Leukocytosis plus fever/aspiration pneumonia Patient developed fever.  Infectious disease was consulted.  Blood cultures are negative so far.  COVID-19 influenza and RSV PCR negative.  Respiratory viral panel  negative.  CT maxillofacial was negative for acute infection. Infectious disease has seen the patient and thinks that patient may have had an aspiration event.   Patient was initially treated with ceftriaxone  and doxycycline .  Changed over to cephalexin by ID.  Remains afebrile with improvement in WBC.  He is also noted to have thrombocytosis which could reflect either mildly hypovolemic state or reactive to his acute medical issues.  Will gently hydrate him and recheck labs in the morning.  Elevated troponin Minimal elevation just above reference limit < 15 -> 21 hs troponin. EKG is limited by artifact but no overt ischemic changes. And he has no component of chest pain. Suspect demand in setting of his acute respiratory process above.  Outpatient follow-up with cardiology   Recent dental extraction  No signs of complication on exam, well healed.  CT maxillofacial does not show any osteomyelitis or abscess  Atrial fibrillation, paroxysmal Stable.  On anticoagulation with Eliquis .  Noted to be on metoprolol .   Deconditioning  Seen by physical therapy.  Home health is recommended.   Hypokalemia/hypomagnesemia/hypophosphatemia Monitor periodically   Anemia-iron deficiency -suspect related to poor po intake/blood loss from recent dental extractions -hgb stable but lower than baseline -PCP follow up - Iron p.o. for now   Nutrition Status: Nutrition Problem: Severe Malnutrition Etiology: social / environmental circumstances Signs/Symptoms: percent weight loss, severe muscle depletion, moderate fat depletion Percent weight loss: 22 % (in one year) Interventions: Glucerna shake, MVI   DVT prophylaxis: On Eliquis    Code Status: Full Code Family Communication: Daughter being updated periodically Disposition: Home with home health when improved    Consultants:  Pulmonary-signed off ID  Subjective: Patient feels well.  Looking  forward to going home soon.  Objective: Vitals:    05/16/24 0436 05/16/24 0802 05/16/24 0852 05/16/24 0942  BP: 127/63 118/84  118/84  Pulse: 66 69  81  Resp: 20 20    Temp: 98.4 F (36.9 C) 98.4 F (36.9 C)    TempSrc: Oral Oral    SpO2: 97% 96% 95%   Weight: 62 kg     Height:        Intake/Output Summary (Last 24 hours) at 05/16/2024 1006 Last data filed at 05/15/2024 2310 Gross per 24 hour  Intake 476 ml  Output 875.3 ml  Net -399.3 ml   Filed Weights   05/13/24 0330 05/14/24 0400 05/16/24 0436  Weight: 61 kg 63.9 kg 62 kg    Examination:  General appearance: Awake alert.  In no distress Resp: Clear to auscultation bilaterally.  Normal effort Cardio: S1-S2 is normal regular.  No S3-S4.  No rubs murmurs or bruit GI: Abdomen is soft.  Nontender nondistended.  Bowel sounds are present normal.  No masses organomegaly Extremities: No edema.  Full range of motion of lower extremities. Neurologic: Alert and oriented x3.  No focal neurological deficits.    Data Reviewed:   CBC: Recent Labs  Lab 05/10/24 1244 05/11/24 0152 05/12/24 0222 05/13/24 0227 05/14/24 0235 05/15/24 0218 05/16/24 0227  WBC 12.9*   < > 15.0* 28.9* 17.8* 15.8* 15.0*  NEUTROABS 10.0*  --   --   --  14.2*  --   --   HGB 10.0*   < > 10.3* 10.4* 10.2* 9.9* 9.7*  HCT 32.1*   < > 33.2* 32.8* 32.1* 31.8* 31.6*  MCV 90.7   < > 89.7 88.4 89.2 90.1 89.8  PLT 579*   < > 641* 659* 662* 688* 728*   < > = values in this interval not displayed.   Basic Metabolic Panel: Recent Labs  Lab 05/12/24 0222 05/13/24 0227 05/14/24 0235 05/15/24 0218 05/16/24 0227  NA 134* 138 137 135 135  K 4.1 4.0 4.1 4.1 3.9  CL 99 98 98 97* 96*  CO2 26 29 28 27 27   GLUCOSE 159* 126* 115* 128* 125*  BUN 19 18 18 20 16   CREATININE 0.83 0.65 0.68 0.60* 0.70  CALCIUM  8.3* 8.7* 8.7* 8.7* 8.9   GFR: Estimated Creatinine Clearance: 64.6 mL/min (by C-G formula based on SCr of 0.7 mg/dL). Liver Function Tests: Recent Labs  Lab 05/14/24 0235  AST 16  ALT 21  ALKPHOS 88   BILITOT 0.4  PROT 6.5  ALBUMIN 2.1*   CBG: Recent Labs  Lab 05/12/24 0612 05/13/24 0403 05/14/24 0515 05/15/24 0355 05/16/24 0620  GLUCAP 95 128* 149* 132* 98    Recent Results (from the past 240 hours)  Resp panel by RT-PCR (RSV, Flu A&B, Covid) Anterior Nasal Swab     Status: None   Collection Time: 05/06/24  6:16 PM   Specimen: Anterior Nasal Swab  Result Value Ref Range Status   SARS Coronavirus 2 by RT PCR NEGATIVE NEGATIVE Final    Comment: (NOTE) SARS-CoV-2 target nucleic acids are NOT DETECTED.  The SARS-CoV-2 RNA is generally detectable in upper respiratory specimens during the acute phase of infection. The lowest concentration of SARS-CoV-2 viral copies this assay can detect is 138 copies/mL. A negative result does not preclude SARS-Cov-2 infection and should not be used as the sole basis for treatment or other patient management decisions. A negative result may occur with  improper specimen collection/handling, submission of specimen other  than nasopharyngeal swab, presence of viral mutation(s) within the areas targeted by this assay, and inadequate number of viral copies(<138 copies/mL). A negative result must be combined with clinical observations, patient history, and epidemiological information. The expected result is Negative.  Fact Sheet for Patients:  bloggercourse.com  Fact Sheet for Healthcare Providers:  seriousbroker.it  This test is no t yet approved or cleared by the [place]  FDA and  has been authorized for detection and/or diagnosis of SARS-CoV-2 by FDA under an Emergency Use Authorization (EUA). This EUA will remain  in effect (meaning this test can be used) for the duration of the COVID-19 declaration under Section 564(b)(1) of the Act, 21 U.S.C.section 360bbb-3(b)(1), unless the authorization is terminated  or revoked sooner.       Influenza A by PCR NEGATIVE NEGATIVE Final    Influenza B by PCR NEGATIVE NEGATIVE Final    Comment: (NOTE) The Xpert Xpress SARS-CoV-2/FLU/RSV plus assay is intended as an aid in the diagnosis of influenza from Nasopharyngeal swab specimens and should not be used as a sole basis for treatment. Nasal washings and aspirates are unacceptable for Xpert Xpress SARS-CoV-2/FLU/RSV testing.  Fact Sheet for Patients: bloggercourse.com  Fact Sheet for Healthcare Providers: seriousbroker.it  This test is not yet approved or cleared by the [place]  FDA and has been authorized for detection and/or diagnosis of SARS-CoV-2 by FDA under an Emergency Use Authorization (EUA). This EUA will remain in effect (meaning this test can be used) for the duration of the COVID-19 declaration under Section 564(b)(1) of the Act, 21 U.S.C. section 360bbb-3(b)(1), unless the authorization is terminated or revoked.     Resp Syncytial Virus by PCR NEGATIVE NEGATIVE Final    Comment: (NOTE) Fact Sheet for Patients: bloggercourse.com  Fact Sheet for Healthcare Providers: seriousbroker.it  This test is not yet approved or cleared by the [place]  FDA and has been authorized for detection and/or diagnosis of SARS-CoV-2 by FDA under an Emergency Use Authorization (EUA). This EUA will remain in effect (meaning this test can be used) for the duration of the COVID-19 declaration under Section 564(b)(1) of the Act, 21 U.S.C. section 360bbb-3(b)(1), unless the authorization is terminated or revoked.  Performed at [facility], [address] [place], [place] [address]   Blood Culture (routine x 2)     Status: None   Collection Time: 05/06/24  6:31 PM   Specimen: BLOOD  Result Value Ref Range Status   Specimen Description   Final    BLOOD LEFT ANTECUBITAL Performed at [facility], [address],  [place], [place] [address]    Special Requests   Final    Blood Culture adequate volume BOTTLES DRAWN AEROBIC AND ANAEROBIC Performed at [facility], [address], [place], [place] [address]    Culture   Final    NO GROWTH 5 DAYS Performed at [facility], [address] [place], [place] [address]    Report Status 05/11/2024 FINAL  Final  Blood Culture (routine x 2)     Status: None   Collection Time: 05/06/24  7:15 PM   Specimen: BLOOD  Result Value Ref Range Status   Specimen Description   Final    BLOOD RIGHT ANTECUBITAL Performed at [facility], [address] [place], [place] [address]    Special Requests   Final    Blood Culture adequate volume BOTTLES DRAWN AEROBIC AND ANAEROBIC Performed at [facility], [address] [place], [place] [address]  Culture   Final    NO GROWTH 5 DAYS Performed at [facility], [address], [place], [place] [address]    Report Status 05/11/2024 FINAL  Final  MRSA Next Gen by PCR, Nasal     Status: Abnormal   Collection Time: 05/06/24 10:41 PM   Specimen: Nasal Mucosa; Nasal Swab  Result Value Ref Range Status   MRSA by PCR Next Gen DETECTED (A) NOT DETECTED Final    Comment: RESULTS CALLED TO READ BACK BY AND VERIFED WITH RN M.LAYTON ON 05/07/24 AT 0021 BY NM (NOTE) The GeneXpert MRSA Assay (FDA approved for NASAL specimens only), is one component of a comprehensive MRSA colonization surveillance program. It is not intended to diagnose MRSA infection nor to guide or monitor treatment for MRSA infections. Test performance is not FDA approved in patients less than 58 years old. Performed at [facility], [address], [place], [place] [address]   Respiratory (~20 pathogens) panel by PCR     Status: None   Collection Time: 05/06/24 11:35 PM   Specimen: Nasopharyngeal Swab; Respiratory  Result Value Ref Range Status   Adenovirus NOT DETECTED NOT  DETECTED Final   Coronavirus 229E NOT DETECTED NOT DETECTED Final    Comment: (NOTE) The Coronavirus on the Respiratory Panel, DOES NOT test for the novel  Coronavirus (2019 nCoV)    Coronavirus HKU1 NOT DETECTED NOT DETECTED Final   Coronavirus NL63 NOT DETECTED NOT DETECTED Final   Coronavirus OC43 NOT DETECTED NOT DETECTED Final   Metapneumovirus NOT DETECTED NOT DETECTED Final   Rhinovirus / Enterovirus NOT DETECTED NOT DETECTED Final   Influenza A NOT DETECTED NOT DETECTED Final   Influenza B NOT DETECTED NOT DETECTED Final   Parainfluenza Virus 1 NOT DETECTED NOT DETECTED Final   Parainfluenza Virus 2 NOT DETECTED NOT DETECTED Final   Parainfluenza Virus 3 NOT DETECTED NOT DETECTED Final   Parainfluenza Virus 4 NOT DETECTED NOT DETECTED Final   Respiratory Syncytial Virus NOT DETECTED NOT DETECTED Final   Bordetella pertussis NOT DETECTED NOT DETECTED Final   Bordetella Parapertussis NOT DETECTED NOT DETECTED Final   Chlamydophila pneumoniae NOT DETECTED NOT DETECTED Final   Mycoplasma pneumoniae NOT DETECTED NOT DETECTED Final    Comment: Performed at [facility], [address] [place], [place] [address]  Respiratory (~20 pathogens) panel by PCR     Status: None   Collection Time: 05/12/24  5:06 PM   Specimen: Nasopharyngeal Swab; Respiratory  Result Value Ref Range Status   Adenovirus NOT DETECTED NOT DETECTED Final   Coronavirus 229E NOT DETECTED NOT DETECTED Final    Comment: (NOTE) The Coronavirus on the Respiratory Panel, DOES NOT test for the novel  Coronavirus (2019 nCoV)    Coronavirus HKU1 NOT DETECTED NOT DETECTED Final   Coronavirus NL63 NOT DETECTED NOT DETECTED Final   Coronavirus OC43 NOT DETECTED NOT DETECTED Final   Metapneumovirus NOT DETECTED NOT DETECTED Final   Rhinovirus / Enterovirus NOT DETECTED NOT DETECTED Final   Influenza A NOT DETECTED NOT DETECTED Final   Influenza B NOT DETECTED NOT DETECTED Final   Parainfluenza Virus 1 NOT  DETECTED NOT DETECTED Final   Parainfluenza Virus 2 NOT DETECTED NOT DETECTED Final   Parainfluenza Virus 3 NOT DETECTED NOT DETECTED Final   Parainfluenza Virus 4 NOT DETECTED NOT DETECTED Final   Respiratory Syncytial Virus NOT DETECTED NOT DETECTED Final   Bordetella pertussis NOT DETECTED NOT DETECTED Final   Bordetella  Parapertussis NOT DETECTED NOT DETECTED Final   Chlamydophila pneumoniae NOT DETECTED NOT DETECTED Final   Mycoplasma pneumoniae NOT DETECTED NOT DETECTED Final    Comment: Performed at [facility], [address] [place], [place] [address]  Resp panel by RT-PCR (RSV, Flu A&B, Covid)     Status: None   Collection Time: 05/12/24  6:07 PM  Result Value Ref Range Status   SARS Coronavirus 2 by RT PCR NEGATIVE NEGATIVE Final   Influenza A by PCR NEGATIVE NEGATIVE Final   Influenza B by PCR NEGATIVE NEGATIVE Final    Comment: (NOTE) The Xpert Xpress SARS-CoV-2/FLU/RSV plus assay is intended as an aid in the diagnosis of influenza from Nasopharyngeal swab specimens and should not be used as a sole basis for treatment. Nasal washings and aspirates are unacceptable for Xpert Xpress SARS-CoV-2/FLU/RSV testing.  Fact Sheet for Patients: bloggercourse.com  Fact Sheet for Healthcare Providers: seriousbroker.it  This test is not yet approved or cleared by the [place]  FDA and has been authorized for detection and/or diagnosis of SARS-CoV-2 by FDA under an Emergency Use Authorization (EUA). This EUA will remain in effect (meaning this test can be used) for the duration of the COVID-19 declaration under Section 564(b)(1) of the Act, 21 U.S.C. section 360bbb-3(b)(1), unless the authorization is terminated or revoked.     Resp Syncytial Virus by PCR NEGATIVE NEGATIVE Final    Comment: (NOTE) Fact Sheet for Patients: bloggercourse.com  Fact Sheet for Healthcare  Providers: seriousbroker.it  This test is not yet approved or cleared by the [place]  FDA and has been authorized for detection and/or diagnosis of SARS-CoV-2 by FDA under an Emergency Use Authorization (EUA). This EUA will remain in effect (meaning this test can be used) for the duration of the COVID-19 declaration under Section 564(b)(1) of the Act, 21 U.S.C. section 360bbb-3(b)(1), unless the authorization is terminated or revoked.  Performed at [facility], [address], [place], [place] [address]   Culture, blood (Routine X 2) w Reflex to ID Panel     Status: None (Preliminary result)   Collection Time: 05/12/24  6:31 PM   Specimen: BLOOD  Result Value Ref Range Status   Specimen Description BLOOD SITE NOT SPECIFIED  Final   Special Requests   Final    BOTTLES DRAWN AEROBIC AND ANAEROBIC Blood Culture results may not be optimal due to an inadequate volume of blood received in culture bottles   Culture   Final    NO GROWTH 4 DAYS Performed at [facility], [address], [place], [place] [address]    Report Status PENDING  Incomplete  Culture, blood (Routine X 2) w Reflex to ID Panel     Status: None (Preliminary result)   Collection Time: 05/12/24  6:33 PM   Specimen: BLOOD RIGHT HAND  Result Value Ref Range Status   Specimen Description BLOOD RIGHT HAND  Final   Special Requests   Final    BOTTLES DRAWN AEROBIC AND ANAEROBIC Blood Culture adequate volume   Culture   Final    NO GROWTH 4 DAYS Performed at [facility], [address] [place], [place] [address]    Report Status PENDING  Incomplete  Expectorated Sputum Assessment w Gram Stain, Rflx to Resp Cult     Status: None   Collection Time: 05/12/24  8:30 PM   Specimen: Expectorated Sputum  Result Value Ref Range Status   Specimen Description EXPECTORATED SPUTUM  Final  Special Requests Normal  Final   Sputum evaluation   Final    THIS SPECIMEN IS ACCEPTABLE  FOR SPUTUM CULTURE Performed at [facility], [address], [place], [place] [address]    Report Status 05/12/2024 FINAL  Final  Culture, Respiratory w Gram Stain     Status: None (Preliminary result)   Collection Time: 05/12/24  8:30 PM  Result Value Ref Range Status   Specimen Description EXPECTORATED SPUTUM  Final   Special Requests Normal Reflexed from [id number]  Final   Gram Stain   Final    FEW WBC PRESENT,BOTH PMN AND MONONUCLEAR MODERATE GRAM NEGATIVE RODS FEW GRAM POSITIVE COCCI    Culture   Final    ABUNDANT STENOTROPHOMONAS MALTOPHILIA CULTURE REINCUBATED FOR BETTER GROWTH Performed at [facility], [address], [place], [place] [address]    Report Status PENDING  Incomplete  Acid Fast Smear (AFB)     Status: None   Collection Time: 05/12/24  8:30 PM   Specimen: Sputum  Result Value Ref Range Status   AFB Specimen Processing Concentration  Final   Acid Fast Smear Negative  Final    Comment: (NOTE) Performed At: [facility] [address] [place], [place] [id number] Jennette Shorter MD Ey:[id number]    Source (AFB) EXPECTORATED SPUTUM  Final    Comment: Performed at [facility], [address] [place] [address] [place], [place] [address]  Acid Fast Smear (AFB)     Status: None   Collection Time: 05/14/24  3:38 PM   Specimen: Sputum  Result Value Ref Range Status   AFB Specimen Processing Concentration  Final   Acid Fast Smear Negative  Final    Comment: (NOTE) Performed At: [facility] [address] [place], [place] [id number] Jennette Shorter MD Ey:[id number]    Source (AFB) TRACHEAL ASPIRATE  Final    Comment: Performed at [facility], [address] [place], [place] [address]      Radiology Studies: No results found.   Scheduled Meds:  apixaban   5 mg Oral BID   budesonide -glycopyrrolate -formoterol   2 puff Inhalation BID   busPIRone   10 mg Oral BID   cephALEXin  500 mg Oral QID   feeding supplement (GLUCERNA SHAKE)  237  mL Oral BID BM   fluconazole  200 mg Oral Daily   furosemide   20 mg Oral Daily   guaiFENesin   600 mg Oral BID   iron polysaccharides  150 mg Oral Daily   metoprolol  succinate  25 mg Oral BID   multivitamin with minerals  1 tablet Oral Daily   pantoprazole   40 mg Oral Daily   sodium chloride  flush  3 mL Intravenous Q12H   triamcinolone   1 spray Nasal Daily   Continuous Infusions:  sodium chloride  75 mL/hr at 05/16/24 0949      LOS: 10 days   Shatia Sindoni, [facility] Available via Epic secure chat 7am-7pm After these hours, please refer to coverage provider listed on [email or web address] 05/16/2024, 10:06 AM

## 2024-05-16 NOTE — Progress Notes (Signed)
 Mobility Specialist Progress Note;   05/16/24 1229  Mobility  Activity Pivoted/transferred to/from [facility];Pivoted/transferred from chair to bed  Level of Assistance Minimal assist, patient does 75% or more  Assistive Device Front wheel walker  Distance Ambulated (ft) 8 ft  Activity Response Tolerated well  Mobility Referral Yes  Mobility visit 1 Mobility  Mobility Specialist Start Time (ACUTE ONLY) 1229  Mobility Specialist Stop Time (ACUTE ONLY) 1242  Mobility Specialist Time Calculation (min) (ACUTE ONLY) 13 min   Answered pts call light requesting assistance to [facility] then back to bed. Required MinA to stand from chair and safely transfer to [facility]. BM successful, pericare performed for pt. Then transferred pt back to bed. VSS on 2LO2 throughout. Pt left comfortably in bed with all needs met, alarm on.   Lauraine Erm Mobility Specialist Please contact via SecureChat or [employer] [phone]

## 2024-05-16 NOTE — Progress Notes (Signed)
 Physical Therapy Treatment Patient Details Name: Matthew Hall MRN: [id number] DOB: 29-Jan-1946 Today's Date: 05/16/2024   History of Present Illness Pt is a 78 y.o. 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 PMH - recent CVA with R foot drop, atrial fibrillation, COPD, hypertension, vasculitis, diabetes, diabetic neuropathy, bladder cancer.    PT Comments  Pt received asleep but easily awakened by knocking, and agreeable to working with PT today. Pt able to get EOB with increased time on RA. Pt able to don his R AFO with supervision for safety while EOB. Pt was able to ambulate 40 ft on RA before requiring 2L to maintain O2 sats due to being hypoxic, SpO2 dropping to 86% on RA when good pleth signal achieved. Pt recalls safety precautions well such as locking brakes on rollator, reaching back to chair prior to sitting, and pushing off of bed to stand. Pt ambulated well in hall for 26ft but demonstrates poor safety awareness with obstacles, verbal cues required to slow down. Pt would benefit from further ambulating with obstacles to improve safety awareness. Pt continues to benefit from PT services to progress toward functional mobility goals.    If plan is discharge home, recommend the following: Assistance with cooking/housework;Assist for transportation   Can travel by private Psychologist, Clinical (4 wheels)    Recommendations for Other Services       Precautions / Restrictions Precautions Precautions: Fall Recall of Precautions/Restrictions: Intact Restrictions Weight Bearing Restrictions Per Provider Order: No     Mobility  Bed Mobility Overal bed mobility: Modified Independent             General bed mobility comments: to R EOB, increased time, HOB elevated    Transfers Overall transfer level: Needs assistance Equipment used: Rollator (4 wheels) Transfers: Sit  to/from Stand Sit to Stand: Supervision, Contact guard assist           General transfer comment: Supervision to stand to locked rollator. Verbal ques required for patient to step closer to chair prior to sitting. Poor eccentric control    Ambulation/Gait Ambulation/Gait assistance: Contact guard assist, Supervision Gait Distance (Feet): 200 Feet Assistive device: Rollator (4 wheels) Gait Pattern/deviations: Step-through pattern       General Gait Details: Steady pace, pt required verbal ques to slow pace when walking around  obstacles in hallway for pt safety and line mgmt. Pt began ambulation on RA but ended up on 2L O2 due to sats dropping in 80s and poor readings.   Stairs             Wheelchair Mobility     Tilt Bed    Modified Rankin (Stroke Patients Only)       Balance Overall balance assessment: Mild deficits observed, not formally tested                                          Communication Communication Communication: No apparent difficulties  Cognition Arousal: Alert Behavior During Therapy: WFL for tasks assessed/performed                             Following commands: Intact      Cueing Cueing Techniques: Verbal cues  Exercises      General Comments  General comments (skin integrity, edema, etc.): Pt requested to only sit up in chair for 1-2 hours. Mobility and RN notified. BP 117/56 (71) seated. Pt ambulated with AFO today and was able to don it himself.      Pertinent Vitals/Pain Pain Assessment Pain Assessment: No/denies pain    Home Living                          Prior Function            PT Goals (current goals can now be found in the care plan section) Acute Rehab PT Goals Patient Stated Goal: to feel better PT Goal Formulation: With patient Time For Goal Achievement: 05/23/24 Progress towards PT goals: Progressing toward goals    Frequency    Min 2X/week      PT Plan       Co-evaluation              AM-PAC PT 6 Clicks Mobility   Outcome Measure  Help needed turning from your back to your side while in a flat bed without using bedrails?: None Help needed moving from lying on your back to sitting on the side of a flat bed without using bedrails?: None Help needed moving to and from a bed to a chair (including a wheelchair)?: A Little Help needed standing up from a chair using your arms (e.g., wheelchair or bedside chair)?: A Little Help needed to walk in hospital room?: A Little Help needed climbing 3-5 steps with a railing? : A Little 6 Click Score: 20    End of Session Equipment Utilized During Treatment: Gait belt;Oxygen  Activity Tolerance: Patient tolerated treatment well Patient left: in chair;with call bell/phone within reach;with chair alarm set Nurse Communication: Mobility status;Other (comment) (Ambulating sats; pt requires 2L O2 during ambulation.) PT Visit Diagnosis: Unsteadiness on feet (R26.81);Muscle weakness (generalized) (M62.81);Difficulty in walking, not elsewhere classified (R26.2)     Time: 8969-8882 PT Time Calculation (min) (ACUTE ONLY): 47 min  Charges:    $Gait Training: 8-22 mins $Therapeutic Activity: 23-37 mins PT General Charges $$ ACUTE PT VISIT: 1 Visit                     Johnnie Gaynelle RADDLE    [facility] Merl Bommarito 05/16/2024, 12:00 PM

## 2024-05-16 NOTE — Progress Notes (Signed)
 SATURATION QUALIFICATIONS: (This note is used to comply with regulatory documentation for home oxygen )  Patient Saturations on Room Air at Rest = 93%  Patient Saturations on Room Air while Ambulating = 86%  Patient Saturations on 2 Liters of oxygen  while Ambulating = 90% and above  Please briefly explain why patient needs home oxygen : Pt hypoxic on RA with exertional tasks.

## 2024-05-16 NOTE — Plan of Care (Signed)
   Problem: Education: Goal: Knowledge of General Education information will improve Description: Including pain rating scale, medication(s)/side effects and non-pharmacologic comfort measures Outcome: Progressing   Problem: Nutrition: Goal: Adequate nutrition will be maintained Outcome: Progressing   Problem: Coping: Goal: Level of anxiety will decrease Outcome: Progressing

## 2024-05-17 ENCOUNTER — Other Ambulatory Visit (HOSPITAL_BASED_OUTPATIENT_CLINIC_OR_DEPARTMENT_OTHER): Payer: Self-pay

## 2024-05-17 ENCOUNTER — Other Ambulatory Visit (HOSPITAL_COMMUNITY): Payer: Self-pay

## 2024-05-17 DIAGNOSIS — J9621 Acute and chronic respiratory failure with hypoxia: Secondary | ICD-10-CM | POA: Diagnosis not present

## 2024-05-17 LAB — CBC
HCT: 35.1 % — ABNORMAL LOW (ref 39.0–52.0)
Hemoglobin: 11 g/dL — ABNORMAL LOW (ref 13.0–17.0)
MCH: 28.1 pg (ref 26.0–34.0)
MCHC: 31.3 g/dL (ref 30.0–36.0)
MCV: 89.8 fL (ref 80.0–100.0)
Platelets: 688 K/uL — ABNORMAL HIGH (ref 150–400)
RBC: 3.91 MIL/uL — ABNORMAL LOW (ref 4.22–5.81)
RDW: 14.2 % (ref 11.5–15.5)
WBC: 13.9 K/uL — ABNORMAL HIGH (ref 4.0–10.5)
nRBC: 0 % (ref 0.0–0.2)

## 2024-05-17 LAB — CULTURE, BLOOD (ROUTINE X 2)
Culture: NO GROWTH
Culture: NO GROWTH
Special Requests: ADEQUATE

## 2024-05-17 LAB — BASIC METABOLIC PANEL WITH GFR
Anion gap: 12 (ref 5–15)
BUN: 17 mg/dL (ref 8–23)
CO2: 28 mmol/L (ref 22–32)
Calcium: 9.1 mg/dL (ref 8.9–10.3)
Chloride: 99 mmol/L (ref 98–111)
Creatinine, Ser: 0.79 mg/dL (ref 0.61–1.24)
GFR, Estimated: >60 mL/min (ref >60)
Glucose, Bld: 110 mg/dL — ABNORMAL HIGH (ref 70–99)
Potassium: 4.3 mmol/L (ref 3.5–5.1)
Sodium: 139 mmol/L (ref 135–145)

## 2024-05-17 LAB — GLUCOSE, CAPILLARY: Glucose-Capillary: 97 mg/dL (ref 70–99)

## 2024-05-17 MED ORDER — FUROSEMIDE 20 MG PO TABS
20.0000 mg | ORAL_TABLET | Freq: Every day | ORAL | 0 refills | Status: DC
  Filled 2024-05-17: qty 60, 60d supply, fill #0

## 2024-05-17 MED ORDER — POLYSACCHARIDE IRON COMPLEX 150 MG PO CAPS
150.0000 mg | ORAL_CAPSULE | Freq: Every day | ORAL | 0 refills | Status: DC
  Filled 2024-05-17: qty 30, 30d supply, fill #0

## 2024-05-17 MED ORDER — FLUCONAZOLE 200 MG PO TABS
200.0000 mg | ORAL_TABLET | Freq: Every day | ORAL | 0 refills | Status: AC
  Filled 2024-05-17: qty 4, 4d supply, fill #0

## 2024-05-17 MED ORDER — CEPHALEXIN 500 MG PO CAPS
500.0000 mg | ORAL_CAPSULE | Freq: Four times a day (QID) | ORAL | 0 refills | Status: AC
  Filled 2024-05-17: qty 16, 4d supply, fill #0

## 2024-05-17 NOTE — TOC Progression Note (Addendum)
 Transition of Care [facility]) - Progression Note    Patient Details  Name: Matthew Hall MRN: [id number] Date of Birth: [birth year]  [facility] CM/SW Contact  Robynn Eileen Hoose, RN Phone Number: 05/17/2024, 8:38 AM  Clinical Narrative:   I spoke with patient's daughter and she is reporting that the order for PT OT and SLP cannot say home health. If it is listed as home health the agency that serves his community will not accept it and they will have to wait for his PCP to write a different order.  It has to say just PT OT SLP or outpatient PT OT SLP.  Provider made aware.  0900: New Orders for PT OT SLP noted as ambulatory orders per daughter request. Daughter updated on new orders. Daughter requested documentation that patient is using oxygen  only at night, provider made aware.    1444: DC Summary not available to fax to [facility] at this time. Ambulatory PT, OT, SLP orders faxed to [facility] with success receipt returned.   Expected Discharge Plan: Home/Self Care Barriers to Discharge: Continued Medical Work up               Expected Discharge Plan and Services   Discharge Planning Services: CM Consult   Living arrangements for the past 2 months: Independent Living Facility Expected Discharge Date: 05/09/24                                     Social Drivers of Health (SDOH) Interventions SDOH Screenings   Food Insecurity: No Food Insecurity (05/06/2024)  Housing: Low Risk  (05/06/2024)  Transportation Needs: No Transportation Needs (05/06/2024)  Utilities: Not At Risk (05/06/2024)  Depression (PHQ2-9): Low Risk  (03/19/2024)  Social Connections: Moderately Isolated (05/06/2024)  Tobacco Use: Medium Risk (05/07/2024)    Readmission Risk Interventions    07/15/2023    2:01 PM 06/22/2023   11:49 AM  Readmission Risk Prevention Plan  Transportation Screening Complete Complete  Home Care Screening  Complete  Medication Review (RN CM)  Complete   Medication Review (RN Care Manager) Complete   PCP or Specialist appointment within 3-5 days of discharge Complete   HRI or Home Care Consult Complete   SW Recovery Care/Counseling Consult Complete   Palliative Care Screening Not Applicable   Skilled Nursing Facility Not Applicable

## 2024-05-17 NOTE — Discharge Summary (Signed)
 [facility]  Physician Discharge Summary   Patient ID: Matthew Hall MRN: [id number] DOB/AGE: 14-Sep-1945 78 y.o.  Admit date: 05/06/2024 Discharge date: 05/17/2024    PCP: Candise [name], MD  DISCHARGE DIAGNOSES:    Acute on chronic respiratory failure with hypoxemia (HCC)   Acute on chronic systolic congestive heart failure (HCC) Aspiration pneumonia   Protein-calorie malnutrition, severe Elevated troponin Paroxysmal atrial fibrillation    RECOMMENDATIONS FOR OUTPATIENT FOLLOW UP: CBC and basic metabolic panel at follow-up with PCP   Home Health: Outpatient PT OT SLP Equipment/Devices: None.  Patient has oxygen  at home  CODE STATUS: Full code  DISCHARGE CONDITION: fair  Diet recommendation: As before  INITIAL HISTORY: 78 y.o. 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  Seen by pulmonology at admission and they agree it was not pneumonia/bronchiectasis most likely volume overload.  He responded very well to diuresis, returning to his home levels of oxygen -room air during the day and 2 L at night.    HOSPITAL COURSE:   Acute exacerbation of Systolic heart failure Chronic hypoxic respiratory failure - 2L at night --pulm consulted considering complex underlying lung disease -Not convinced that this is a COPD exacerbation, certainly not bronchiectasis exacerbation. -- Upon admission CT chest done-no new lung consolidations, trace pleural effusions -- Received diuresis at OSH with concern for HF.  Currently on 20 mg of  furosemide  daily.  -echo: EF 50 to 55%-improved from prior-- needs outpatient follow up -- ESR, CRP-done and elevated but nonspecific -- ANA, ANCA, C3/4 with his prior hx vasculitis are negative -- Continue home Breztri . saline neb BID, Duonebs (sub alb for xopenex ) q6 hr, IS, guaifenisen, OOB to chair during the day  -- wean O2 to off during the day-- only wears at night Respiratory status has improved.   Leukocytosis plus fever/aspiration pneumonia Patient developed fever.  Infectious disease was consulted.  Blood cultures are negative so far.  COVID-19 influenza and RSV PCR negative.  Respiratory viral panel negative.  CT maxillofacial was negative for acute infection. AFB smears are negative.  Cultures are pending. Infectious disease has seen the patient and thinks that patient may have had an aspiration event.   Patient was initially treated with ceftriaxone  and doxycycline .  Changed over to cephalexin by ID.  Remains afebrile with improvement in WBC.  He is also noted to have thrombocytosis which could reflect either mildly hypovolemic state or reactive to his acute medical issues.  Improved with gentle hydration.  Discussed with his daughter.  Recommended to have these rechecked when he is improved maybe in the next 1 to 2 weeks.     Elevated troponin Minimal elevation just above reference limit < 15 -> 21 hs troponin. EKG is limited by artifact but no overt ischemic changes. And he has no component of chest pain. Suspect demand in setting of his acute respiratory process above.  Outpatient follow-up with cardiology.  Message sent to cardiology to arrange outpatient follow-up.   Recent dental extraction  No signs of complication on exam, well healed.  CT maxillofacial does not show any osteomyelitis or abscess   Atrial fibrillation, paroxysmal  Stable.  On anticoagulation with Eliquis .  Noted to be on metoprolol .   Hypokalemia/hypomagnesemia/hypophosphatemia   Anemia-iron  deficiency -suspect related to poor po intake/blood loss from recent dental extractions -hgb stable but lower than baseline - Iron p.o. for now   Nutrition Status: Nutrition Problem: Severe Malnutrition Etiology: social / environmental circumstances Signs/Symptoms: percent weight loss, severe muscle depletion, moderate fat depletion Percent weight loss: 22 % (in one year) Interventions: Glucerna shake, MVI  Patient is stable.  Improvement in WBC and platelet count noted.  Respiratory status is stable.  Long discussion with patient's daughter.  All of her questions were answered.  He is stable for discharge today.  PERTINENT LABS:  The results of significant diagnostics from this hospitalization (including imaging, microbiology, ancillary and laboratory) are listed below for reference.    Microbiology: Recent Results (from the past 240 hours)  Respiratory (~20 pathogens) panel by PCR     Status: None   Collection Time: 05/12/24  5:06 PM   Specimen: Nasopharyngeal Swab; Respiratory  Result Value Ref Range Status   Adenovirus NOT DETECTED NOT DETECTED Final   Coronavirus 229E NOT DETECTED NOT DETECTED Final    Comment: (NOTE) The Coronavirus on the Respiratory Panel, DOES NOT test for the novel  Coronavirus (2019 nCoV)    Coronavirus HKU1 NOT DETECTED NOT DETECTED Final   Coronavirus NL63 NOT DETECTED NOT DETECTED Final   Coronavirus OC43 NOT DETECTED NOT DETECTED Final   Metapneumovirus NOT DETECTED NOT DETECTED Final   Rhinovirus / Enterovirus NOT DETECTED NOT DETECTED Final   Influenza A NOT DETECTED NOT DETECTED Final   Influenza B NOT DETECTED NOT DETECTED Final   Parainfluenza Virus 1 NOT DETECTED NOT DETECTED Final   Parainfluenza Virus 2 NOT DETECTED NOT DETECTED Final   Parainfluenza Virus 3 NOT DETECTED NOT DETECTED Final   Parainfluenza Virus 4 NOT DETECTED NOT DETECTED Final   Respiratory Syncytial Virus NOT DETECTED NOT DETECTED Final   Bordetella pertussis NOT  DETECTED NOT DETECTED Final   Bordetella Parapertussis NOT DETECTED NOT DETECTED Final   Chlamydophila pneumoniae NOT DETECTED NOT DETECTED Final   Mycoplasma pneumoniae NOT DETECTED NOT DETECTED Final    Comment: Performed at [facility], [address] [place], [place] [address]  Resp panel by RT-PCR (RSV, Flu A&B, Covid)     Status: None   Collection Time: 05/12/24  6:07 PM  Result Value Ref Range Status   SARS Coronavirus 2 by RT PCR NEGATIVE NEGATIVE Final   Influenza A by PCR NEGATIVE NEGATIVE Final   Influenza B by PCR NEGATIVE NEGATIVE Final    Comment: (NOTE) The Xpert Xpress SARS-CoV-2/FLU/RSV plus assay is intended as an aid in the diagnosis of influenza from Nasopharyngeal swab specimens and should not be used as a sole basis for treatment. Nasal washings and aspirates are unacceptable for Xpert Xpress SARS-CoV-2/FLU/RSV testing.  Fact Sheet for Patients: bloggercourse.com  Fact Sheet for Healthcare Providers: seriousbroker.it  This test is not yet approved or cleared by the [place]  FDA and has been authorized for detection and/or diagnosis of SARS-CoV-2 by FDA under an Emergency Use Authorization (EUA). This EUA will remain in effect (meaning this test can be used) for the duration of the COVID-19 declaration under Section 564(b)(1) of the Act, 21 U.S.C. section 360bbb-3(b)(1), unless the authorization is terminated or revoked.     Resp Syncytial Virus by PCR NEGATIVE NEGATIVE Final    Comment: (NOTE) Fact Sheet for Patients: bloggercourse.com  Fact Sheet for Healthcare  Providers: seriousbroker.it  This test is not yet approved or cleared by the [place]  FDA and has been authorized for detection and/or diagnosis of SARS-CoV-2 by FDA under an Emergency Use Authorization (EUA). This EUA will remain in effect (meaning this test can be used) for the  duration of the COVID-19 declaration under Section 564(b)(1) of the Act, 21 U.S.C. section 360bbb-3(b)(1), unless the authorization is terminated or revoked.  Performed at [facility], [address] [place], [place] [address]   Culture, blood (Routine X 2) w Reflex to ID Panel     Status: None   Collection Time: 05/12/24  6:31 PM   Specimen: BLOOD  Result Value Ref Range Status   Specimen Description BLOOD SITE NOT SPECIFIED  Final   Special Requests   Final    BOTTLES DRAWN AEROBIC AND ANAEROBIC Blood Culture results may not be optimal due to an inadequate volume of blood received in culture bottles   Culture   Final    NO GROWTH 5 DAYS Performed at [facility], [address], [place], [place] [address]    Report Status 05/17/2024 FINAL  Final  Culture, blood (Routine X 2) w Reflex to ID Panel     Status: None   Collection Time: 05/12/24  6:33 PM   Specimen: BLOOD RIGHT HAND  Result Value Ref Range Status   Specimen Description BLOOD RIGHT HAND  Final   Special Requests   Final    BOTTLES DRAWN AEROBIC AND ANAEROBIC Blood Culture adequate volume   Culture   Final    NO GROWTH 5 DAYS Performed at [facility], [address], [place], [place] [address]    Report Status 05/17/2024 FINAL  Final  Expectorated Sputum Assessment w Gram Stain, Rflx to Resp Cult     Status: None   Collection Time: 05/12/24  8:30 PM   Specimen: Expectorated Sputum  Result Value Ref Range Status   Specimen Description EXPECTORATED SPUTUM  Final   Special Requests Normal  Final   Sputum evaluation   Final    THIS SPECIMEN IS ACCEPTABLE FOR SPUTUM CULTURE Performed at [facility], [address], [place], [place] [address]    Report Status 05/12/2024 FINAL  Final  Culture, Respiratory w Gram Stain     Status: None (Preliminary result)   Collection Time: 05/12/24  8:30 PM  Result Value Ref Range Status   Specimen Description EXPECTORATED SPUTUM  Final   Special  Requests Normal Reflexed from [id number]  Final   Gram Stain   Final    FEW WBC PRESENT,BOTH PMN AND MONONUCLEAR MODERATE GRAM NEGATIVE RODS FEW GRAM POSITIVE COCCI    Culture   Final    ABUNDANT STENOTROPHOMONAS MALTOPHILIA Sent to [facility] for further susceptibility testing. Performed at [facility], [address], [place], [place] [address]    Report Status PENDING  Incomplete  Acid Fast Smear (AFB)     Status: None   Collection Time: 05/12/24  8:30 PM   Specimen: Sputum  Result Value Ref Range Status   AFB Specimen Processing Concentration  Final   Acid Fast Smear Negative  Final    Comment: (NOTE) Performed At: [facility] [address] [place], [place] [id number] Jennette Shorter MD Ey:[id number]    Source (AFB) EXPECTORATED SPUTUM  Final    Comment: Performed at [facility], [address] [place], [place] [address]  Susceptibility, Aer + Anaerob  Status: Abnormal   Collection Time: 05/12/24  8:30 PM  Result Value Ref Range Status   Suscept, Aer + Anaerob Preliminary report (A)  Final    Comment: (NOTE) Performed At: [facility] [address] [place], [place] [id number] Jennette Shorter MD Ey:[id number]    Source [phone] STENO MAL EXSPU SENSI  Final    Comment: Performed at [facility], [address] [place], [place] [address]  Susceptibility Result     Status: Abnormal   Collection Time: 05/12/24  8:30 PM  Result Value Ref Range Status   Suscept Result 1 Comment (A)  Final    Comment: (NOTE) Stenotrophomonas maltophilia Identification performed by account, not confirmed by this laboratory. Performed At: [facility] [address] [place], [place] [id number] Jennette Shorter MD Ey:[id number]   Acid Fast Smear (AFB)     Status: None   Collection Time: 05/14/24  3:38 PM   Specimen: Sputum  Result Value Ref Range Status   AFB Specimen Processing Concentration  Final   Acid Fast Smear Negative  Final    Comment:  (NOTE) Performed At: [facility] [address] [place], [place] [id number] Jennette Shorter MD Ey:[id number]    Source (AFB) TRACHEAL ASPIRATE  Final    Comment: Performed at [facility], [address] [place], [place] [address]     Labs:   Basic Metabolic Panel: Recent Labs  Lab 05/13/24 0227 05/14/24 0235 05/15/24 0218 05/16/24 0227 05/17/24 0411  NA 138 137 135 135 139  K 4.0 4.1 4.1 3.9 4.3  CL 98 98 97* 96* 99  CO2 29 28 27 27 28   GLUCOSE 126* 115* 128* 125* 110*  BUN 18 18 20 16 17   CREATININE 0.65 0.68 0.60* 0.70 0.79  CALCIUM  8.7* 8.7* 8.7* 8.9 9.1   Liver Function Tests: Recent Labs  Lab 05/14/24 0235  AST 16  ALT 21  ALKPHOS 88  BILITOT 0.4  PROT 6.5  ALBUMIN 2.1*    CBC: Recent Labs  Lab 05/13/24 0227 05/14/24 0235 05/15/24 0218 05/16/24 0227 05/17/24 0411  WBC 28.9* 17.8* 15.8* 15.0* 13.9*  NEUTROABS  --  14.2*  --   --   --   HGB 10.4* 10.2* 9.9* 9.7* 11.0*  HCT 32.8* 32.1* 31.8* 31.6* 35.1*  MCV 88.4 89.2 90.1 89.8 89.8  PLT 659* 662* 688* 728* 688*    BNP: BNP (last 3 results) Recent Labs    08/12/23 1138 05/07/24 0303 05/11/24 0152  BNP 222.1* 549.8* 337.8*    ProBNP (last 3 results) Recent Labs    09/10/23 1513 05/06/24 1831  PROBNP 609* 5,188.0*    CBG: Recent Labs  Lab 05/13/24 0403 05/14/24 0515 05/15/24 0355 05/16/24 0620 05/17/24 0540  GLUCAP 128* 149* 132* 98 97     IMAGING STUDIES CT MAXILLOFACIAL WO CONTRAST Result Date: 05/13/2024 EXAM: CT OF THE FACE WITHOUT CONTRAST 05/12/2024 10:16:01 PM TECHNIQUE: CT of the face was performed without the administration of intravenous contrast. Multiplanar reformatted images are provided for review. Automated exposure control, iterative reconstruction, and/or weight based adjustment of the mA/kV was utilized to reduce the radiation dose to as low as reasonably achievable. COMPARISON: CTA head and neck 10/28/2023. CLINICAL HISTORY: 78 year old male.  Recent dental infection/abscess with 5 teeth removed, now with fever. FINDINGS: FACIAL BONES: Bilateral maxillary and mandible dental extractions with no adverse features identified. Underlying mandible and maxilla remain intact and aligned. Other facial bones are stable. Chronic leftward nasal septal deviation  and spurring. Bilateral paranasal sinuses, visible middle ears and mastoids remain well aerated. Calcified atherosclerosis at the skull base. Stable visible non-contrast parenchyma. ORBITS: Postoperative changes to both globes. SINUSES AND MASTOIDS: Bilateral paranasal sinuses, visible middle ears and mastoids remain well aerated. SOFT TISSUES: Negative visible non-contrast larynx, pharynx, parapharyngeal spaces, retropharyngeal space, sublingual space, submandibular spaces, parotid spaces. Non-contrast bilateral masticator spaces also within normal limits. No soft tissue gas or inflammation is identified. No upper cervical lymphadenopathy. IMPRESSION: 1. Bilateral maxillary and mandible dental extractions with no adverse features identified. 2. No inflammatory process identified in the non-contrast face. Electronically signed by: Helayne Hurst MD 05/13/2024 04:34 AM EST RP Workstation: [id number]   DG CHEST PORT 1 VIEW Result Date: 05/12/2024 EXAM: 1 VIEW(S) XRAY OF THE CHEST 05/12/2024 07:54:00 AM COMPARISON: None available. CLINICAL HISTORY: Pneumonia FINDINGS: LUNGS AND PLEURA: Chronic bilateral interstitial and reticular lung markings. Nodular density in lingula corresponds to the scarring and bronchiectasis on CT 05/07/24. No pulmonary edema. No pleural effusion. No pneumothorax. HEART AND MEDIASTINUM: No acute abnormality of the cardiac and mediastinal silhouettes. BONES AND SOFT TISSUES: No acute osseous abnormality. IMPRESSION: 1. No acute findings. 2. Chronic bronchitis. Electronically signed by: Norman Gatlin MD 05/12/2024 02:52 PM EST RP Workstation: [id number]   ECHOCARDIOGRAM  COMPLETE Result Date: 05/08/2024    ECHOCARDIOGRAM REPORT   Patient Name:   Matthew Hall Date of Exam: 05/08/2024 Medical Rec #:  [id number]         Height:       71.0 in Accession #:    [id number]        Weight:       135.9 lb Date of Birth:  16-Feb-1946        BSA:          1.789 m Patient Age:    78 years          BP:           108/76 mmHg Patient Gender: M                 HR:           71 bpm. Exam Location:  Inpatient Procedure: 2D Echo, Cardiac Doppler and Color Doppler (Both Spectral and Color            Flow Doppler were utilized during procedure). Indications:    CHF I50.31  History:        Patient has prior history of Echocardiogram examinations, most                 recent 04/13/2023. CHF, Stroke, Signs/Symptoms:Shortness of                 Breath; Risk Factors:Hypertension and Diabetes.  Sonographer:    BERNARDA ROCKS Referring Phys: [id number] JESSICA U VANN IMPRESSIONS  1. Left ventricular ejection fraction, by estimation, is 50 to 55%. The left ventricle has low normal function. The left ventricle demonstrates global hypokinesis. Left ventricular diastolic parameters were normal.  2. Right ventricular systolic function is normal. The right ventricular size is normal. There is moderately elevated pulmonary artery systolic pressure. The estimated right ventricular systolic pressure is 46.6 mmHg.  3. Left atrial size was severely dilated.  4. Right atrial size was severely dilated.  5. The mitral valve is degenerative. Trivial mitral valve regurgitation. No evidence of mitral stenosis. The mean mitral valve gradient is 3.0 mmHg.  6. The aortic valve is normal in structure. Aortic valve regurgitation is not visualized. No  aortic stenosis is present.  7. The inferior vena cava is dilated in size with <50% respiratory variability, suggesting right atrial pressure of 15 mmHg. FINDINGS  Left Ventricle: Left ventricular ejection fraction, by estimation, is 50 to 55%. The left ventricle has low normal function.  The left ventricle demonstrates global hypokinesis. The left ventricular internal cavity size was normal in size. There is no left ventricular hypertrophy. Abnormal (paradoxical) septal motion, consistent with left bundle branch block. Left ventricular diastolic parameters were normal. Normal left ventricular filling pressure. Right Ventricle: The right ventricular size is normal. No increase in right ventricular wall thickness. Right ventricular systolic function is normal. There is moderately elevated pulmonary artery systolic pressure. The tricuspid regurgitant velocity is 2.81 m/s, and with an assumed right atrial pressure of 15 mmHg, the estimated right ventricular systolic pressure is 46.6 mmHg. Left Atrium: Left atrial size was severely dilated. Right Atrium: Right atrial size was severely dilated. Pericardium: There is no evidence of pericardial effusion. Mitral Valve: The mitral valve is degenerative in appearance. There is mild calcification of the mitral valve leaflet(s). Mild mitral annular calcification. Trivial mitral valve regurgitation. No evidence of mitral valve stenosis. MV peak gradient, 8.1 mmHg. The mean mitral valve gradient is 3.0 mmHg. Tricuspid Valve: The tricuspid valve is normal in structure. Tricuspid valve regurgitation is mild . No evidence of tricuspid stenosis. Aortic Valve: The aortic valve is normal in structure. Aortic valve regurgitation is not visualized. No aortic stenosis is present. Aortic valve mean gradient measures 5.0 mmHg. Aortic valve peak gradient measures 8.5 mmHg. Aortic valve area, by VTI measures 1.45 cm. Pulmonic Valve: The pulmonic valve was normal in structure. Pulmonic valve regurgitation is trivial. No evidence of pulmonic stenosis. Aorta: The aortic root is normal in size and structure. Venous: The inferior vena cava is dilated in size with less than 50% respiratory variability, suggesting right atrial pressure of 15 mmHg. IAS/Shunts: No atrial level shunt  detected by color flow Doppler.  LEFT VENTRICLE PLAX 2D LVIDd:         5.50 cm      Diastology LVIDs:         3.40 cm      LV e' medial:    8.16 cm/s LV PW:         1.00 cm      LV E/e' medial:  14.2 LV IVS:        0.90 cm      LV e' lateral:   14.80 cm/s LVOT diam:     2.00 cm      LV E/e' lateral: 7.8 LV SV:         41 LV SV Index:   23 LVOT Area:     3.14 cm  LV Volumes (MOD) LV vol d, MOD A2C: 114.0 ml LV vol d, MOD A4C: 112.0 ml LV vol s, MOD A2C: 50.7 ml LV vol s, MOD A4C: 52.1 ml LV SV MOD A2C:     63.3 ml LV SV MOD A4C:     112.0 ml LV SV MOD BP:      61.5 ml RIGHT VENTRICLE            IVC RV Basal diam:  3.50 cm    IVC diam: 2.60 cm TAPSE (M-mode): 1.9 cm RVSP:           46.6 mmHg LEFT ATRIUM              Index  RIGHT ATRIUM            Index LA diam:        5.20 cm  2.91 cm/m   RA Pressure: 15.00 mmHg LA Vol (A2C):   127.0 ml 70.98 ml/m  RA Area:     30.30 cm LA Vol (A4C):   110.0 ml 61.48 ml/m  RA Volume:   111.00 ml  62.04 ml/m LA Biplane Vol: 123.0 ml 68.74 ml/m  AORTIC VALVE                     PULMONIC VALVE AV Area (Vmax):    1.65 cm      PV Vmax:          0.87 m/s AV Area (Vmean):   1.31 cm      PV Peak grad:     3.0 mmHg AV Area (VTI):     1.45 cm      PR End Diast Vel: 3.76 msec AV Vmax:           146.00 cm/s AV Vmean:          101.000 cm/s AV VTI:            0.284 m AV Peak Grad:      8.5 mmHg AV Mean Grad:      5.0 mmHg LVOT Vmax:         76.50 cm/s LVOT Vmean:        42.100 cm/s LVOT VTI:          0.131 m LVOT/AV VTI ratio: 0.46  AORTA Ao Root diam: 2.90 cm Ao Asc diam:  2.70 cm MITRAL VALVE                TRICUSPID VALVE MV Area (PHT): 5.16 cm     TR Peak grad:   31.6 mmHg MV Area VTI:   1.84 cm     TR Vmax:        281.00 cm/s MV Peak grad:  8.1 mmHg     Estimated RAP:  15.00 mmHg MV Mean grad:  3.0 mmHg     RVSP:           46.6 mmHg MV Vmax:       1.42 m/s MV Vmean:      78.9 cm/s    SHUNTS MV Decel Time: 147 msec     Systemic VTI:  0.13 m MR Peak grad: 28.9 mmHg     Systemic  Diam: 2.00 cm MR Vmax:      269.00 cm/s MV E velocity: 116.00 cm/s MV A velocity: 59.40 cm/s MV E/A ratio:  1.95 Wilbert Bihari MD Electronically signed by Wilbert Bihari MD Signature Date/Time: 05/08/2024/3:37:14 PM    Final    CT CHEST WO CONTRAST Result Date: 05/07/2024 EXAM: CT CHEST WITHOUT CONTRAST 05/07/2024 01:32:53 AM TECHNIQUE: CT of the chest was performed without the administration of intravenous contrast. Multiplanar reformatted images are provided for review. Automated exposure control, iterative reconstruction, and/or weight based adjustment of the mA/kV was utilized to reduce the radiation dose to as low as reasonably achievable. COMPARISON: Chest CT [id number]. CLINICAL HISTORY: Hx of COPD, bronchiectasis, cutaneous vasculitis, here w acute respiratory failure, eval for flare, pneumonia. FINDINGS: MEDIASTINUM: Heart is enlarged. There is trace pericardial fluid. There are atherosclerotic calcifications of the aorta and coronary arteries. The central airways are clear. LYMPH NODES: No mediastinal, hilar or axillary lymphadenopathy. LUNGS AND PLEURA: There are trace bilateral pleural effusions. There  is some areas of air trapping throughout both lungs. Bronchiectasis, bronchial wall thickening and mucus plugging peripherally are again noted predominantly in the right lower lobe, lingula and right middle lobe. Scattered reticular opacities and scarring in both lung apices along with calcified pleural plaques appear unchanged. There is no new focal lung consolidation. Right lower lobe nodule measures 5 mm on image 5/72, similar to prior. No pneumothorax. SOFT TISSUES/BONES: No acute abnormality of the bones or soft tissues. UPPER ABDOMEN: Limited images of the upper abdomen demonstrates no acute abnormality. IMPRESSION: 1. No new focal lung consolidation. 2. Trace bilateral pleural effusions. 3. 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 If patient  is 78 years old and emphysema is present, consider evaluation for a low-dose CT lung cancer screening program. 4. Stable bronchiectasis, bronchial wall thickening, and mucus plugging peripherally, predominantly in the right lower lobe, lingula, and right middle lobe. 5. Right lower lobe solid pulmonary nodule measuring 5 mm, unchanged. For an incidental solid pulmonary nodule 5 mm, no routine follow-up imaging is recommended per Fleischner Society Guidelines (applicable if patient is 78 years old, without cancer history or immunocompromise). Electronically signed by: Greig Pique MD 05/07/2024 01:46 AM EDT RP Workstation: [id number]   DG Chest Portable 1 View Result Date: 05/06/2024 EXAM: 1 VIEW(S) XRAY OF THE CHEST 05/06/2024 06:50:00 PM COMPARISON: Chest x-ray 04/30/2024. CT of the chest 12/17/2023. CLINICAL HISTORY: Short of breath. Independent living had CNA check on him. Seen last week for dehydration. Returns this week- decreased oral intake-SOB-coughing. Low grade fever. FINDINGS: LUNGS AND PLEURA: Scattered chronic appearing reticular opacities are again seen. No new focal lung infiltrates. No pulmonary edema. No pleural effusion. No pneumothorax. HEART AND MEDIASTINUM: No acute abnormality of the cardiac and mediastinal silhouettes. BONES AND SOFT TISSUES: No acute osseous abnormality. IMPRESSION: 1. No acute process. 2. Scattered chronic appearing reticular opacities, stable compared to prior studies. Electronically signed by: Greig Pique MD 05/06/2024 07:14 PM EDT RP Workstation: [id number]   DG Chest Port 1 View Result Date: 04/30/2024 CLINICAL DATA:  Possible sepsis EXAM: PORTABLE CHEST 1 VIEW COMPARISON:  08/12/2023, chest CT 12/17/2023 FINDINGS: Chronic lung disease with bronchiectasis and subtle patchy nodular opacities. Mild cardiomegaly. No acute confluent airspace disease, pleural effusion or pneumothorax. Scarring at the right apex. IMPRESSION: Chronic lung disease.  No acute  confluent airspace disease. Electronically Signed   By: Luke Bun M.D.   On: 04/30/2024 20:53    DISCHARGE EXAMINATION: Vitals:   05/17/24 0421 05/17/24 0738 05/17/24 0852 05/17/24 0940  BP: (!) 144/71 (!) 143/65  136/68  Pulse: 61 73  79  Resp: (!) 23 18    Temp: 98.6 F (37 C) 97.7 F (36.5 C)    TempSrc: Oral Oral    SpO2: 99% 100% 94%   Weight: 62.4 kg     Height:       General appearance: Awake alert.  In no distress Resp: Clear to auscultation bilaterally.  Normal effort Cardio: S1-S2 is normal regular.  No S3-S4.  No rubs murmurs or bruit GI: Abdomen is soft.  Nontender nondistended.  Bowel sounds are present normal.  No masses organomegaly   DISPOSITION: Home  Discharge Instructions     (HEART FAILURE PATIENTS) Call MD:  Anytime you have any of the following symptoms: 1) 3 pound weight gain in 24 hours or 5 pounds in 1 week 2) shortness of breath, with or without a dry hacking cough 3) swelling in the hands, feet or  stomach 4) if you have to sleep on extra pillows at night in order to breathe.   Complete by: As directed    Ambulatory referral to Occupational Therapy   Complete by: As directed    Ambulatory referral to Physical Therapy   Complete by: As directed    Ambulatory referral to Speech Therapy   Complete by: As directed    Call MD for:  difficulty breathing, headache or visual disturbances   Complete by: As directed    Call MD for:  extreme fatigue   Complete by: As directed    Call MD for:  persistant dizziness or light-headedness   Complete by: As directed    Call MD for:  persistant nausea and vomiting   Complete by: As directed    Call MD for:  severe uncontrolled pain   Complete by: As directed    Call MD for:  temperature >100.4   Complete by: As directed    Diet - low sodium heart healthy   Complete by: As directed    Diet general   Complete by: As directed    Discharge instructions   Complete by: As directed    Take your medications as  prescribed  You were cared for by a hospitalist during your hospital stay. If you have any questions about your discharge medications or the care you received while you were in the hospital after you are discharged, you can call the unit and asked to speak with the hospitalist on call if the hospitalist that took care of you is not available. Once you are discharged, your primary care physician will handle any further medical issues. Please note that NO REFILLS for any discharge medications will be authorized once you are discharged, as it is imperative that you return to your primary care physician (or establish a relationship with a primary care physician if you do not have one) for your aftercare needs so that they can reassess your need for medications and monitor your lab values. If you do not have a primary care physician, you can call [phone] for a physician referral.   Increase activity slowly   Complete by: As directed    Increase activity slowly   Complete by: As directed          Allergies as of 05/17/2024       Reactions   Simvastatin Rash   Cortisone Nausea And Vomiting   Albuterol  Palpitations, Other (See Comments)   Pt is okay to take xopenex , albuterol  raises his heart rate   Augmentin [amoxicillin-pot Clavulanate] Rash   Ciprofloxacin Rash   Contrast Media [iodinated Contrast Media] Rash   Flagyl  [metronidazole ] Rash   Latex Hives, Rash   Sulfa  Antibiotics Rash        Medication List     TAKE these medications    ALPRAZolam  0.25 MG tablet Commonly known as: XANAX  Take 1 tablet (0.25 mg total) by mouth 2 (two) times daily as needed for anxiety or sleep.   Breztri  Aerosphere 160-9-4.8 MCG/ACT Aero inhaler Generic drug: budesonide -glycopyrrolate -formoterol  Inhale 2 puffs into the lungs 2 (two) times daily.   busPIRone  10 MG tablet Commonly known as: BUSPAR  Take 1 tablet (10 mg total) by mouth 2 (two) times daily.   cephALEXin 500 MG capsule Commonly known  as: KEFLEX Take 1 capsule (500 mg total) by mouth 4 (four) times daily for 4 days.   Eliquis  5 MG Tabs tablet Generic drug: apixaban  Take 1 tablet (5 mg total) by mouth  2 (two) times daily.   esomeprazole  20 MG capsule Commonly known as: NEXIUM  Take 20 mg by mouth daily at 12 noon.   feeding supplement Liqd Take 237 mLs by mouth 2 (two) times daily between meals.   Ferrex 150 150 MG capsule Generic drug: iron polysaccharides Take 1 capsule (150 mg total) by mouth daily.   fluconazole 200 MG tablet Commonly known as: DIFLUCAN Take 1 tablet (200 mg total) by mouth daily for 4 days.   furosemide  20 MG tablet Commonly known as: LASIX  Take 1 tablet (20 mg total) by mouth daily.   guaiFENesin  600 MG 12 hr tablet Commonly known as: MUCINEX  Take 1 tablet (600 mg total) by mouth 2 (two) times daily.   hydrochlorothiazide  12.5 MG tablet Commonly known as: HYDRODIURIL  Take 1 tablet (12.5 mg total) by mouth as needed. What changed: reasons to take this   HYDROcodone -acetaminophen  5-325 MG tablet Commonly known as: NORCO/VICODIN Take 1 tablet by mouth every 4 (four) to 6 (six) hours as needed for pain.   ibuprofen 200 MG tablet Commonly known as: ADVIL Take 200 mg by mouth every 6 (six) hours as needed for mild pain (pain score 1-3) or moderate pain (pain score 4-6).   levalbuterol  0.63 MG/3ML nebulizer solution Commonly known as: Xopenex  Take 3 mLs (0.63 mg total) by nebulization every 6 (six) hours as needed for wheezing or shortness of breath.   levalbuterol  45 MCG/ACT inhaler Commonly known as: XOPENEX  HFA Inhale 2 puffs into the lungs every 6 (six) hours as needed for wheezing.   metFORMIN  500 MG 24 hr tablet Commonly known as: GLUCOPHAGE -XR Take 1 tablet (500 mg total) by mouth daily. What changed:  when to take this additional instructions   metoprolol  succinate 25 MG 24 hr tablet Commonly known as: TOPROL -XL Take 1 tablet (25 mg total) by mouth 2 (two) times  daily.   omeprazole  20 MG capsule Commonly known as: PRILOSEC Take 1 capsule (20 mg total) by mouth daily.   ondansetron  4 MG tablet Commonly known as: ZOFRAN  Take 1 tablet (4 mg total) by mouth 3 (three) times daily as needed for nausea/vomiting.   triamcinolone  55 MCG/ACT Aero nasal inhaler Commonly known as: NASACORT  Place 1 spray into the nose daily. What changed: when to take this   Tylenol  325 MG Caps Generic drug: Acetaminophen  Take 1-2 capsules by mouth every 6 (six) hours as needed (pain).   VITAMIN B12 PO Take 1 tablet by mouth daily.          Follow-up Information     [name], [name], MD Follow up in 1 week(s).   Specialty: Family Medicine Contact information: [address] [place] [address] [phone]         Jude Harden GAILS, MD Follow up.   Specialty: Pulmonary Disease Contact information: [address] [place] [address] [phone]         Debera [name], MD Follow up.   Specialty: Cardiology Contact information: [address] [place] [id number] [phone]         [facility] Follow up.   Specialty: Physical Therapy Why: physical thearpy has been arranged. Contact information: [address] [place] [address] [phone]                 TOTAL DISCHARGE TIME: 35 minutes  Oluwanifemi Petitti Verdene  [facility] Pager on [email or web address]  05/18/2024, 10:36 AM

## 2024-05-19 ENCOUNTER — Telehealth: Payer: Self-pay

## 2024-05-19 NOTE — Transitions of Care (Post Inpatient/ED Visit) (Signed)
   05/19/2024  Name: Matthew Hall MRN: [id number] DOB: 1945/12/20  Today's TOC FU Call Status: Today's TOC FU Call Status:: Unsuccessful Call (1st Attempt) Unsuccessful Call (1st Attempt) Date: 05/19/24  Attempted to reach the patient regarding the most recent Inpatient/ED visit. No VM set up.   Follow Up Plan: Additional outreach attempts will be made to reach the patient to complete the Transitions of Care (Post Inpatient/ED visit) call.   Alan Ee, RN, BSN, CEN [employer]- Transition of Care Team.  [facility] [phone]

## 2024-05-20 ENCOUNTER — Telehealth: Payer: Self-pay

## 2024-05-20 NOTE — Transitions of Care (Post Inpatient/ED Visit) (Signed)
   05/20/2024  Name: Matthew Hall MRN: [id number] DOB: 1945-07-17  Today's TOC FU Call Status: Today's TOC FU Call Status:: Unsuccessful Call (2nd Attempt) Unsuccessful Call (2nd Attempt) Date: 05/20/24  Attempted to reach the patient regarding the most recent Inpatient/ED visit.  Follow Up Plan: Additional outreach attempts will be made to reach the patient to complete the Transitions of Care (Post Inpatient/ED visit) call.   Alan Ee, RN, BSN, CEN [employer]- Transition of Care Team.  [facility] [phone]

## 2024-05-20 NOTE — Telephone Encounter (Signed)
 Received fax from [facility] OT Eval and Plan of Treatment Certification 05/19/24 to 08/10/24   [name] in box front office

## 2024-05-20 NOTE — Telephone Encounter (Signed)
 Home health orders received 05/20/24 for Matthew Hall Home health initiation orders: Yes.  Home health re-certification orders: No. Patient last seen by ordering physician for this condition: 05/02/24. Must be less than 90 days for re-certification and less than 30 days prior for initiation. Visit must have been for the condition the orders are being placed.  Patient meets criteria for Physician to sign orders: Yes.        Current med list has been attached: No        Orders placed on physicians desk for signature: 05/20/24 (date) If patient does not meet criteria for orders to be signed: pt was called to schedule appt. Appt is scheduled for 05/23/24.   Placed on PCP desk to review and sign, if appropriate.   Matthew Hall D [name]

## 2024-05-21 ENCOUNTER — Telehealth: Payer: Self-pay

## 2024-05-21 ENCOUNTER — Ambulatory Visit: Admitting: Physician Assistant

## 2024-05-21 NOTE — Transitions of Care (Post Inpatient/ED Visit) (Signed)
   05/21/2024  Name: Matthew Hall MRN: [id number] DOB: 03-29-1946  Today's TOC FU Call Status: Today's TOC FU Call Status:: Unsuccessful Call (3rd Attempt) Unsuccessful Call (3rd Attempt) Date: 05/21/24  Attempted to reach the patient regarding the most recent Inpatient/ED visit.  Follow Up Plan: No further outreach attempts will be made at this time. We have been unable to contact the patient.  Alan Ee, RN, BSN, CEN [employer]- Transition of Care Team.  [facility] [phone]

## 2024-05-22 ENCOUNTER — Encounter: Payer: Self-pay | Admitting: Family Medicine

## 2024-05-22 ENCOUNTER — Telehealth: Payer: Self-pay

## 2024-05-22 NOTE — Telephone Encounter (Signed)
 Received fax from [facility] SLP Eval and Plan of Treatment Certification 05/21/24 to 08/12/24   [name] in box front office

## 2024-05-22 NOTE — Telephone Encounter (Signed)
Placed on PCP desk to review and sign, if appropriate.  

## 2024-05-23 ENCOUNTER — Encounter: Payer: Self-pay | Admitting: Family Medicine

## 2024-05-23 ENCOUNTER — Other Ambulatory Visit: Payer: Self-pay | Admitting: Urgent Care

## 2024-05-23 ENCOUNTER — Other Ambulatory Visit: Payer: Self-pay

## 2024-05-23 ENCOUNTER — Other Ambulatory Visit (HOSPITAL_BASED_OUTPATIENT_CLINIC_OR_DEPARTMENT_OTHER): Payer: Self-pay

## 2024-05-23 ENCOUNTER — Ambulatory Visit: Admitting: Family Medicine

## 2024-05-23 ENCOUNTER — Other Ambulatory Visit: Payer: Self-pay | Admitting: Family Medicine

## 2024-05-23 VITALS — BP 108/62 | HR 88 | Temp 97.2°F | Ht 71.0 in | Wt 136.6 lb

## 2024-05-23 DIAGNOSIS — R627 Adult failure to thrive: Secondary | ICD-10-CM | POA: Diagnosis not present

## 2024-05-23 DIAGNOSIS — R6251 Failure to thrive (child): Secondary | ICD-10-CM

## 2024-05-23 DIAGNOSIS — F419 Anxiety disorder, unspecified: Secondary | ICD-10-CM

## 2024-05-23 DIAGNOSIS — J690 Pneumonitis due to inhalation of food and vomit: Secondary | ICD-10-CM | POA: Diagnosis not present

## 2024-05-23 DIAGNOSIS — I5032 Chronic diastolic (congestive) heart failure: Secondary | ICD-10-CM | POA: Diagnosis not present

## 2024-05-23 DIAGNOSIS — J9611 Chronic respiratory failure with hypoxia: Secondary | ICD-10-CM

## 2024-05-23 LAB — CBC WITH DIFFERENTIAL/PLATELET
Basophils Absolute: 0.1 K/uL (ref 0.0–0.1)
Basophils Relative: 0.5 % (ref 0.0–3.0)
Eosinophils Absolute: 0.2 K/uL (ref 0.0–0.7)
Eosinophils Relative: 1.1 % (ref 0.0–5.0)
HCT: 34.7 % — ABNORMAL LOW (ref 39.0–52.0)
Hemoglobin: 11 g/dL — ABNORMAL LOW (ref 13.0–17.0)
Lymphocytes Relative: 6.9 % — ABNORMAL LOW (ref 12.0–46.0)
Lymphs Abs: 1.1 K/uL (ref 0.7–4.0)
MCHC: 31.6 g/dL (ref 30.0–36.0)
MCV: 87.9 fl (ref 78.0–100.0)
Monocytes Absolute: 1.3 K/uL — ABNORMAL HIGH (ref 0.1–1.0)
Monocytes Relative: 8.2 % (ref 3.0–12.0)
Neutro Abs: 13.4 K/uL — ABNORMAL HIGH (ref 1.4–7.7)
Neutrophils Relative %: 83.3 % — ABNORMAL HIGH (ref 43.0–77.0)
Platelets: 729 K/uL — ABNORMAL HIGH (ref 150.0–400.0)
RBC: 3.95 Mil/uL — ABNORMAL LOW (ref 4.22–5.81)
RDW: 15.1 % (ref 11.5–15.5)
WBC: 16.1 K/uL — ABNORMAL HIGH (ref 4.0–10.5)

## 2024-05-23 LAB — BASIC METABOLIC PANEL WITH GFR
BUN: 19 mg/dL (ref 6–23)
CO2: 33 meq/L — ABNORMAL HIGH (ref 19–32)
Calcium: 9.3 mg/dL (ref 8.4–10.5)
Chloride: 98 meq/L (ref 96–112)
Creatinine, Ser: 0.78 mg/dL (ref 0.40–1.50)
GFR: 84.47 mL/min (ref >60.00)
Glucose, Bld: 99 mg/dL (ref 70–99)
Potassium: 4.7 meq/L (ref 3.5–5.1)
Sodium: 140 meq/L (ref 135–145)

## 2024-05-23 LAB — CULTURE, RESPIRATORY W GRAM STAIN: Special Requests: NORMAL

## 2024-05-23 LAB — SUSCEPTIBILITY RESULT

## 2024-05-23 LAB — SUSCEPTIBILITY, AER + ANAEROB

## 2024-05-23 MED ORDER — BUSPIRONE HCL 10 MG PO TABS
10.0000 mg | ORAL_TABLET | Freq: Two times a day (BID) | ORAL | 1 refills | Status: AC
  Filled 2024-05-23: qty 180, 90d supply, fill #0
  Filled 2024-05-26: qty 60, 30d supply, fill #0
  Filled 2024-06-20: qty 60, 30d supply, fill #1
  Filled 2024-07-22: qty 60, 30d supply, fill #2

## 2024-05-23 NOTE — Telephone Encounter (Signed)
 Signed and put in box to go up front. Signed:  Gerlene Hockey, MD           05/23/2024

## 2024-05-23 NOTE — Progress Notes (Signed)
 05/23/2024  CC:  Chief Complaint  Patient presents with   Follow-up    Patient is a 78 y.o.  male who presents for  hospital follow up, specifically [facility] Services face-to-face visit. Dates hospitalized: 10/28 to 05/17/2024. Days since d/c from hospital: 6 days Patient was discharged from hospital to home. Reason for admission to hospital: Acute on chronic respiratory failure with hypoxemia, acute on chronic congestive heart failure, aspiration pneumonia, failure to thrive/malnutrition. Date of interactive (phone) contact with patient and/or caregiver: Attempts made on 11/10 and 05/20/2024  I have reviewed patient's discharge summary plus pertinent specific notes, labs, and imaging from the hospitalization.    Weight 62.4 kg upon discharge.  CURRENTLY: Feeling a little better.  Generalized weakness is still his most prominent symptom. He is eating better, drinking fine.  No fevers.  He uses 2 L of oxygen  intermittently. No cough or chest pain.   Discharge medication list: Alprazolam  0.25 mg twice daily as needed, Breztri  inhaler 2 puffs twice a day, buspirone  10 mg twice daily, Keflex 500 mg 4 times a day, Eliquis  5 mg twice daily, Nexium  20 mg a day, Ferrex 150/day, fluconazole 200 mg daily x 4 days, furosemide  20 mg a day, Mucinex  600 mg twice a day, HCTZ 12.5 mg daily, Vicodin 5/325 1 tab every 6 hours as needed pain, ibuprofen 200 mg every 6 hours as needed, Xopenex  every 6 hours as needed, metformin  XR 500 mg, 1 tab daily, Toprol -XL 25 mg twice daily, Zofran  4 mg 3 times daily as needed, Nasacort  nasal spray, vitamin B12 tab daily.   Medication reconciliation was done today and patient is taking meds as recommended by discharging hospitalist/specialist.    ROS as above, plus-->  no wheezing, no dizziness, no HAs, no rashes, no melena/hematochezia.  No polyuria or polydipsia.  No myalgias or arthralgias.  No focal weakness, paresthesias, or tremors.  No acute vision or  hearing abnormalities.  No dysuria or unusual/new urinary urgency or frequency.  No recent changes in lower legs. No n/v/d or abd pain.  No palpitations.    PMH:  Past Medical History:  Diagnosis Date   Anemia, chronic disease    Anxiety    Aortic atherosclerosis    Atrial fibrillation (HCC)    Bladder cancer (HCC)    Bronchiectasis (HCC)    Chronic systolic heart failure (HCC)    Colon polyps    COPD (chronic obstructive pulmonary disease) (HCC)    Diabetes mellitus with complication (HCC)    Diabetic peripheral neuropathy (HCC) 12/27/2020   Diverticulosis    Essential hypertension    GERD (gastroesophageal reflux disease)    History of CVA with residual deficit    R foot drop   Nephrolithiasis    OSA (obstructive sleep apnea)    Osteoarthritis    Pulmonary nodule    surveillance by Dr. 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 Vernice who felt like EF was 55 to 60% and no impairment of LV function, nl RV fxn, Mod tricusp regurg.   TRANSURETHRAL RESECTION OF BLADDER TUMOR  1996   VIDEO BRONCHOSCOPY N/A 06/19/2023   Procedure: VIDEO BRONCHOSCOPY WITHOUT FLUORO;  Surgeon: Gretta Leita SQUIBB, DO;  Location: [facility] ENDOSCOPY;  Service: Cardiopulmonary;  Laterality: N/A;    MEDS:  Outpatient Medications Prior to Visit  Medication Sig Dispense Refill   Acetaminophen  (TYLENOL ) 325 MG CAPS Take 1-2 capsules by mouth every 6 (six) hours as needed (pain).      ALPRAZolam  (XANAX ) 0.25 MG tablet Take 1 tablet (0.25 mg total) by mouth 2 (two) times daily as needed for anxiety or sleep. 90 tablet 0   apixaban  (ELIQUIS ) 5 MG TABS tablet Take 1 tablet (5 mg total) by mouth 2 (two) times daily. 180 tablet 1   budesonide -glycopyrrolate -formoterol  (BREZTRI  AEROSPHERE) 160-9-4.8 MCG/ACT AERO inhaler Inhale 2 puffs into the lungs 2 (two) times daily. 10.7 g 11   busPIRone  (BUSPAR ) 10 MG tablet Take 1 tablet (10 mg total) by mouth 2 (two) times daily. 180 tablet 1   Cyanocobalamin  (VITAMIN B12 PO) Take 1 tablet by mouth daily.     esomeprazole  (NEXIUM ) 20 MG capsule Take 20 mg by mouth daily at 12 noon.     feeding supplement (ENSURE ENLIVE / ENSURE PLUS) LIQD Take 237 mLs by mouth 2 (two) times daily between meals.     furosemide  (LASIX ) 20 MG tablet Take 1 tablet (20 mg total) by mouth daily. 60 tablet 0   guaiFENesin  (MUCINEX ) 600 MG 12 hr tablet Take 1 tablet (600 mg total) by mouth 2 (two) times daily. 60 tablet 5   hydrochlorothiazide  (HYDRODIURIL ) 12.5 MG tablet Take 1 tablet (12.5 mg total) by mouth as needed. (Patient taking differently: Take 12.5 mg by mouth as needed (BP > 148 or 150).) 30 tablet 3   iron polysaccharides (NIFEREX) 150 MG capsule Take 1 capsule (150 mg total) by mouth daily. 90 capsule 0   levalbuterol  (XOPENEX  HFA) 45 MCG/ACT inhaler Inhale 2 puffs into the lungs every 6 (six) hours as needed for wheezing. 15 g 3   levalbuterol  (XOPENEX ) 0.63 MG/3ML nebulizer solution Take 3 mLs (0.63 mg total) by nebulization every 6 (six) hours as needed for wheezing or shortness of breath. 270 mL 5   metFORMIN  (GLUCOPHAGE -XR) 500 MG 24 hr tablet Take 1 tablet (500 mg total) by mouth daily. (Patient taking differently: Take 500 mg by mouth See admin instructions. Take 1 tablet by mouth every 2 - 3 days.) 90 tablet 3   metoprolol  succinate (TOPROL -XL) 25 MG 24 hr tablet Take 1 tablet (25 mg total) by mouth 2 (two) times daily. 180 tablet 2   omeprazole   (PRILOSEC) 20 MG capsule Take 1 capsule (20 mg total) by mouth daily. 90 capsule 2   ondansetron  (ZOFRAN ) 4 MG tablet Take 1 tablet (4 mg total) by mouth 3 (three) times daily as needed for nausea/vomiting. 30 tablet 1   triamcinolone  (NASACORT ) 55 MCG/ACT AERO nasal inhaler Place 1 spray into the nose daily. (Patient taking differently: Place 1 spray into the nose 2 (two) times daily.) 16.9 mL 5   HYDROcodone -acetaminophen  (NORCO/VICODIN) 5-325 MG tablet Take 1 tablet by mouth every 4 (four) to 6 (six) hours as needed for pain. 14 tablet 0   ibuprofen (ADVIL) 200 MG tablet Take 200 mg by mouth every 6 (six) hours as needed for mild pain (  pain score 1-3) or moderate pain (pain score 4-6).     No facility-administered medications prior to visit.    Physical Exam    05/23/2024    9:37 AM 05/17/2024    9:40 AM 05/17/2024    7:38 AM  Vitals with BMI  Height 5' 11    Weight 136 lbs 10 oz    BMI 19.06    Systolic 108 136 856  Diastolic 62 68 65  Pulse 88 79 73   Gen: Chronically ill-appearing.  No acute distress.  Alert and oriented x 4. No pallor or jaundice. Cardiovascular: Irregularly irregular rhythm, rate approximately 75, no murmur. Lungs: He has inspiratory crackles in the anterior fields bilaterally.  He has very soft inspiratory crackles in both bases in the posterior fields.  Good aeration, nonlabored respirations.  No wheezing or prolongation of the expiratory phase. Extremities: No edema.  Pertinent labs/imaging Last CBC Lab Results  Component Value Date   WBC 13.9 (H) 05/17/2024   HGB 11.0 (L) 05/17/2024   HCT 35.1 (L) 05/17/2024   MCV 89.8 05/17/2024   MCH 28.1 05/17/2024   RDW 14.2 05/17/2024   PLT 688 (H) 05/17/2024   Lab Results  Component Value Date   IRON 18 (L) 05/09/2024   TIBC 262 05/09/2024   FERRITIN 130 05/09/2024   Lab Results  Component Value Date   VITAMINB12 >1537 (H) 09/10/2023   Last metabolic panel Lab Results  Component Value Date    GLUCOSE 110 (H) 05/17/2024   NA 139 05/17/2024   K 4.3 05/17/2024   CL 99 05/17/2024   CO2 28 05/17/2024   BUN 17 05/17/2024   CREATININE 0.79 05/17/2024   GFRNONAA >60 05/17/2024   CALCIUM  9.1 05/17/2024   PHOS 2.8 05/09/2024   PROT 6.5 05/14/2024   ALBUMIN 2.1 (L) 05/14/2024   LABGLOB 3.3 05/09/2023   LABGLOB 2.8 05/09/2023   BILITOT 0.4 05/14/2024   ALKPHOS 88 05/14/2024   AST 16 05/14/2024   ALT 21 05/14/2024   ANIONGAP 12 05/17/2024   ASSESSMENT/PLAN:  #1 failure to thrive, malnutrition.  Mildly chronic in nature but recently had a prolonged period of poor food and drink intake due to having some dental work done.  He is getting through this. It is hard to tell if he had any significant acute cardiopulmonary conditions contributing to the way he felt recently. He apparently did have an episode of aspiration when taking a pill in the hospital.  He was put on some antibiotics and has finished these, resp at baseline. He is on his normal home oxygen  regimen of 2 L intermittently.  Appears euvolemic. Since discharge home his blood pressures have ranged 109-121 systolic over 66-73 diastolic.  Heart rate 70-93.  Weight 136 to 137.5 pounds.  Will check CBC and metabolic panel today.  No medication changes today.  Medical decision making of moderate complexity was utilized today.  FOLLOW UP:  4 wks  Signed:  Gerlene Hockey, MD           05/23/2024

## 2024-05-23 NOTE — Telephone Encounter (Signed)
 Forms faxed back.

## 2024-05-25 ENCOUNTER — Encounter: Payer: Self-pay | Admitting: Family Medicine

## 2024-05-25 NOTE — Telephone Encounter (Signed)
 Hi Matthew Hall, We'll go ahead and stop lasix .  His last Hba1c was in September and was good (5.9%). We'll check again in about a month.

## 2024-05-26 ENCOUNTER — Ambulatory Visit: Admitting: Nurse Practitioner

## 2024-05-26 ENCOUNTER — Inpatient Hospital Stay: Admitting: Family Medicine

## 2024-05-26 ENCOUNTER — Ambulatory Visit: Payer: Self-pay | Admitting: Family Medicine

## 2024-05-26 ENCOUNTER — Other Ambulatory Visit (HOSPITAL_BASED_OUTPATIENT_CLINIC_OR_DEPARTMENT_OTHER): Payer: Self-pay

## 2024-05-26 ENCOUNTER — Other Ambulatory Visit: Payer: Self-pay

## 2024-05-26 ENCOUNTER — Other Ambulatory Visit (HOSPITAL_COMMUNITY): Payer: Self-pay

## 2024-05-26 MED FILL — Metformin HCl Tab ER 24HR 500 MG: ORAL | 30 days supply | Qty: 30 | Fill #3 | Status: AC

## 2024-05-26 NOTE — Telephone Encounter (Signed)
 Yes, still stop Lasix .  Since he is doing better clinically I do not want to react too much to his CBC numbers.  Sometimes these remain elevated for a while, particularly in the setting of a recent acute illness (even if infection is not involved). Keep follow-up appointment set for 12/1.  We will follow-up his fluid status and repeat his labs at that time.

## 2024-05-26 NOTE — Telephone Encounter (Signed)
 No further action needed.

## 2024-05-27 ENCOUNTER — Other Ambulatory Visit (HOSPITAL_BASED_OUTPATIENT_CLINIC_OR_DEPARTMENT_OTHER): Payer: Self-pay

## 2024-05-27 ENCOUNTER — Telehealth: Payer: Self-pay

## 2024-05-27 ENCOUNTER — Other Ambulatory Visit: Payer: Self-pay | Admitting: Family Medicine

## 2024-05-27 ENCOUNTER — Other Ambulatory Visit (HOSPITAL_COMMUNITY): Payer: Self-pay

## 2024-05-27 ENCOUNTER — Other Ambulatory Visit: Payer: Self-pay

## 2024-05-27 MED ORDER — ALPRAZOLAM 0.25 MG PO TABS
0.2500 mg | ORAL_TABLET | Freq: Two times a day (BID) | ORAL | 2 refills | Status: AC | PRN
  Filled 2024-05-27 – 2024-06-15 (×2): qty 90, 45d supply, fill #0

## 2024-05-27 NOTE — Telephone Encounter (Signed)
Placed on PCP desk to review and sign, if appropriate.  

## 2024-05-27 NOTE — Telephone Encounter (Signed)
 Requesting: alprazolam  Contract: N/A UDS: N/A Last Visit: 05/23/24 Next Visit: 06/09/24 Last Refill: 02/08/24 (90,0)  Please Advise. Rx pending

## 2024-05-27 NOTE — Telephone Encounter (Signed)
 Received fax from [facility] SLP Eval and Plan of Treatment Certification 05/21/24 to 08/12/24   [name] in box front office

## 2024-05-27 NOTE — Progress Notes (Signed)
 Cardiology Office Note    Date:  05/30/2024  ID:  Matthew Hall, DOB 11-12-1945, MRN [id number] PCP:  Candise [name], MD  Cardiologist:  None  Electrophysiologist:  None   Chief Complaint: Follow up for HFpEF   History of Present Illness: .   Matthew Hall is a 78 y.o. male with visit-pertinent history of permanent atrial fibrillation, hypertension, COPD, diabetic neuropathy, bladder cancer.  Patient is a former smoker but quit in 2012.  Previous echo in 2017 showed EF 65 to 70%, normal wall motion, moderate biatrial enlargement.  Patient takes metoprolol  succinate for rate control and Hyzaar for blood pressure.  Patient was previously on Coumadin  however following ischemic stroke in 04/2023 thought related to inconsistent INR he was transitioned to Eliquis .  Echo at the time was initially read as EF 40 to 45% however on review by Dr. Barbaraann felt EF was normal at 55 to 60%.  Patient was last seen in clinic on 11//2024 by Rosaline Fryer, PA.  Patient had remained stable for cardiac standpoint noted that he had lost 20 pounds in the past year as he was caring for his wife.  On chart review on 05/06/2024 patient presented to the emergency department with concerns for shortness of breath.  Reported that he had recently been seen in the ED and was told he was dehydrated.  He had seen his PCP earlier in the week and was started on azithromycin  given concerns for pneumonia.  Patient reported productive cough, subjective fevers, body aches and congestion, reported increased difficulty breathing in all positions.  Pulmonology consulted with consideration for underlying lung disease however per notes they were not convinced this was a COPD exacerbation and did not feel he had pneumonia, antibiotics were discontinued.  Patient received IV Lasix  with 4 L of diuresis with improvement in breathing. Echocardiogram echo on 05/08/2024 indicated LVEF 50 to 55%, LV with global hypokinesis, diastolic  parameters were normal, RV systolic function and size was normal, moderately elevated PASP, LA was severely dilated, RA severely dilated, trivial mitral valve regurgitation with no evidence of stenosis, aortic valve regurgitation not visualized, no stenosis was present. It was noted that his troponin was mildly elevated 15>>21, recommended outpatient follow up with cardiology.  Patient noted to be severely malnourished.  He was admitted to [facility], during stay patient had possible aspiration pneumonia after developing fever started on Keflex .  Patient was discharged on 05/17/2024.  Today he presents for follow-up with his daughter.  He reports that he has been doing okay, does not feel that he is recovering quickly following his hospitalization.  Patient denies any chest pain, worsening shortness of breath, lower extremity edema, orthopnea or PND.  He does notes ongoing fatigue and problems with nausea.  His daughter notes that he has been very slow recovering from recent hospital stay.  Prior to admission patient had a dental procedure with pulling of multiple teeth resulting in poor intake in the weeks following.  Patient's daughter notes that he is supposed to be wearing 2 L nasal cannula of oxygen  at night per pulmonology, patient notes that he does not regularly wear this.  Patient has oxygen  concentrator for as needed use during the day.  On review of patient's blood pressure and weight log his blood pressures and weights have been stable since discharge.  Patient reports that as he has had a 30 pound weight loss in the last year.  Patient reportedly has resumed doing some PT and OT and feels  that his appetite is improving. ROS: .   Today he denies chest pain, lower extremity edema, palpitations, melena, hematuria, hemoptysis, diaphoresis, presyncope, syncope, orthopnea, and PND.  All other systems are reviewed and otherwise negative. Studies Reviewed: SABRA    EKG:  EKG is ordered today, personally reviewed,  demonstrating  EKG Interpretation Date/Time:  Friday May 30 2024 14:02:45 EST Ventricular Rate:  90 PR Interval:    QRS Duration:  124 QT Interval:  380 QTC Calculation: 464 R Axis:   262  Text Interpretation: Atrial fibrillation with premature ventricular or aberrantly conducted complexes Right superior axis deviation Non-specific intra-ventricular conduction delay Artifact When compared with ECG of 12-May-2024 16:48, Vent. rate has decreased BY  44 BPM Confirmed by Quinton Voth [phone]) on 05/30/2024 5:03:55 PM   CV Studies: Cardiac studies reviewed are outlined and summarized above. Otherwise please see EMR for full report. Cardiac Studies & Procedures   ______________________________________________________________________________________________     ECHOCARDIOGRAM  ECHOCARDIOGRAM COMPLETE 05/08/2024  Narrative ECHOCARDIOGRAM REPORT    Patient Name:   Matthew Hall Date of Exam: 05/08/2024 Medical Rec #:  [id number]         Height:       71.0 in Accession #:    [id number]        Weight:       135.9 lb Date of Birth:  08/30/1945        BSA:          1.789 m Patient Age:    78 years          BP:           108/76 mmHg Patient Gender: M                 HR:           71 bpm. Exam Location:  Inpatient  Procedure: 2D Echo, Cardiac Doppler and Color Doppler (Both Spectral and Color Flow Doppler were utilized during procedure).  Indications:    CHF I50.31  History:        Patient has prior history of Echocardiogram examinations, most recent 04/13/2023. CHF, Stroke, Signs/Symptoms:Shortness of Breath; Risk Factors:Hypertension and Diabetes.  Sonographer:    BERNARDA ROCKS Referring Phys: [id number] JESSICA U VANN  IMPRESSIONS   1. Left ventricular ejection fraction, by estimation, is 50 to 55%. The left ventricle has low normal function. The left ventricle demonstrates global hypokinesis. Left ventricular diastolic parameters were normal. 2. Right ventricular systolic  function is normal. The right ventricular size is normal. There is moderately elevated pulmonary artery systolic pressure. The estimated right ventricular systolic pressure is 46.6 mmHg. 3. Left atrial size was severely dilated. 4. Right atrial size was severely dilated. 5. The mitral valve is degenerative. Trivial mitral valve regurgitation. No evidence of mitral stenosis. The mean mitral valve gradient is 3.0 mmHg. 6. The aortic valve is normal in structure. Aortic valve regurgitation is not visualized. No aortic stenosis is present. 7. The inferior vena cava is dilated in size with <50% respiratory variability, suggesting right atrial pressure of 15 mmHg.  FINDINGS Left Ventricle: Left ventricular ejection fraction, by estimation, is 50 to 55%. The left ventricle has low normal function. The left ventricle demonstrates global hypokinesis. The left ventricular internal cavity size was normal in size. There is no left ventricular hypertrophy. Abnormal (paradoxical) septal motion, consistent with left bundle branch block. Left ventricular diastolic parameters were normal. Normal left ventricular filling pressure.  Right Ventricle: The right ventricular size is normal.  No increase in right ventricular wall thickness. Right ventricular systolic function is normal. There is moderately elevated pulmonary artery systolic pressure. The tricuspid regurgitant velocity is 2.81 m/s, and with an assumed right atrial pressure of 15 mmHg, the estimated right ventricular systolic pressure is 46.6 mmHg.  Left Atrium: Left atrial size was severely dilated.  Right Atrium: Right atrial size was severely dilated.  Pericardium: There is no evidence of pericardial effusion.  Mitral Valve: The mitral valve is degenerative in appearance. There is mild calcification of the mitral valve leaflet(s). Mild mitral annular calcification. Trivial mitral valve regurgitation. No evidence of mitral valve stenosis. MV peak  gradient, 8.1 mmHg. The mean mitral valve gradient is 3.0 mmHg.  Tricuspid Valve: The tricuspid valve is normal in structure. Tricuspid valve regurgitation is mild . No evidence of tricuspid stenosis.  Aortic Valve: The aortic valve is normal in structure. Aortic valve regurgitation is not visualized. No aortic stenosis is present. Aortic valve mean gradient measures 5.0 mmHg. Aortic valve peak gradient measures 8.5 mmHg. Aortic valve area, by VTI measures 1.45 cm.  Pulmonic Valve: The pulmonic valve was normal in structure. Pulmonic valve regurgitation is trivial. No evidence of pulmonic stenosis.  Aorta: The aortic root is normal in size and structure.  Venous: The inferior vena cava is dilated in size with less than 50% respiratory variability, suggesting right atrial pressure of 15 mmHg.  IAS/Shunts: No atrial level shunt detected by color flow Doppler.   LEFT VENTRICLE PLAX 2D LVIDd:         5.50 cm      Diastology LVIDs:         3.40 cm      LV e' medial:    8.16 cm/s LV PW:         1.00 cm      LV E/e' medial:  14.2 LV IVS:        0.90 cm      LV e' lateral:   14.80 cm/s LVOT diam:     2.00 cm      LV E/e' lateral: 7.8 LV SV:         41 LV SV Index:   23 LVOT Area:     3.14 cm  LV Volumes (MOD) LV vol d, MOD A2C: 114.0 ml LV vol d, MOD A4C: 112.0 ml LV vol s, MOD A2C: 50.7 ml LV vol s, MOD A4C: 52.1 ml LV SV MOD A2C:     63.3 ml LV SV MOD A4C:     112.0 ml LV SV MOD BP:      61.5 ml  RIGHT VENTRICLE            IVC RV Basal diam:  3.50 cm    IVC diam: 2.60 cm TAPSE (M-mode): 1.9 cm RVSP:           46.6 mmHg  LEFT ATRIUM              Index        RIGHT ATRIUM            Index LA diam:        5.20 cm  2.91 cm/m   RA Pressure: 15.00 mmHg LA Vol (A2C):   127.0 ml 70.98 ml/m  RA Area:     30.30 cm LA Vol (A4C):   110.0 ml 61.48 ml/m  RA Volume:   111.00 ml  62.04 ml/m LA Biplane Vol: 123.0 ml 68.74 ml/m AORTIC VALVE  PULMONIC VALVE AV Area  (Vmax):    1.65 cm      PV Vmax:          0.87 m/s AV Area (Vmean):   1.31 cm      PV Peak grad:     3.0 mmHg AV Area (VTI):     1.45 cm      PR End Diast Vel: 3.76 msec AV Vmax:           146.00 cm/s AV Vmean:          101.000 cm/s AV VTI:            0.284 m AV Peak Grad:      8.5 mmHg AV Mean Grad:      5.0 mmHg LVOT Vmax:         76.50 cm/s LVOT Vmean:        42.100 cm/s LVOT VTI:          0.131 m LVOT/AV VTI ratio: 0.46  AORTA Ao Root diam: 2.90 cm Ao Asc diam:  2.70 cm  MITRAL VALVE                TRICUSPID VALVE MV Area (PHT): 5.16 cm     TR Peak grad:   31.6 mmHg MV Area VTI:   1.84 cm     TR Vmax:        281.00 cm/s MV Peak grad:  8.1 mmHg     Estimated RAP:  15.00 mmHg MV Mean grad:  3.0 mmHg     RVSP:           46.6 mmHg MV Vmax:       1.42 m/s MV Vmean:      78.9 cm/s    SHUNTS MV Decel Time: 147 msec     Systemic VTI:  0.13 m MR Peak grad: 28.9 mmHg     Systemic Diam: 2.00 cm MR Vmax:      269.00 cm/s MV E velocity: 116.00 cm/s MV A velocity: 59.40 cm/s MV E/A ratio:  1.95  Wilbert Bihari MD Electronically signed by Wilbert Bihari MD Signature Date/Time: 05/08/2024/3:37:14 PM    Final          ______________________________________________________________________________________________       Current Reported Medications:.    Current Meds  Medication Sig   Acetaminophen  (TYLENOL ) 325 MG CAPS Take 1-2 capsules by mouth every 6 (six) hours as needed (pain).   ALPRAZolam  (XANAX ) 0.25 MG tablet Take 1 tablet (0.25 mg total) by mouth 2 (two) times daily as needed for anxiety or sleep.   apixaban  (ELIQUIS ) 5 MG TABS tablet Take 1 tablet (5 mg total) by mouth 2 (two) times daily.   budesonide -glycopyrrolate -formoterol  (BREZTRI  AEROSPHERE) 160-9-4.8 MCG/ACT AERO inhaler Inhale 2 puffs into the lungs 2 (two) times daily.   busPIRone  (BUSPAR ) 10 MG tablet Take 1 tablet (10 mg total) by mouth 2 (two) times daily.   Cyanocobalamin  (VITAMIN B12 PO) Take 1  tablet by mouth daily.   esomeprazole  (NEXIUM ) 20 MG capsule Take 20 mg by mouth daily at 12 noon.   feeding supplement (ENSURE ENLIVE / ENSURE PLUS) LIQD Take 237 mLs by mouth 2 (two) times daily between meals.   guaiFENesin  (MUCINEX ) 600 MG 12 hr tablet Take 1 tablet (600 mg total) by mouth 2 (two) times daily.   hydrochlorothiazide  (HYDRODIURIL ) 12.5 MG tablet Take 1 tablet (12.5 mg total) by mouth as needed. (Patient taking differently: Take 12.5 mg by mouth as needed (  BP > 148 or 150).)   iron  polysaccharides (NIFEREX) 150 MG capsule Take 1 capsule (150 mg total) by mouth daily.   levalbuterol  (XOPENEX  HFA) 45 MCG/ACT inhaler Inhale 2 puffs into the lungs every 6 (six) hours as needed for wheezing.   levalbuterol  (XOPENEX ) 0.63 MG/3ML nebulizer solution Take 3 mLs (0.63 mg total) by nebulization every 6 (six) hours as needed for wheezing or shortness of breath.   metFORMIN  (GLUCOPHAGE -XR) 500 MG 24 hr tablet Take 1 tablet (500 mg total) by mouth daily. (Patient taking differently: Take 500 mg by mouth See admin instructions. Take 1 tablet by mouth every 2 - 3 days.)   metoprolol  succinate (TOPROL -XL) 25 MG 24 hr tablet Take 1 tablet (25 mg total) by mouth 2 (two) times daily.   omeprazole  (PRILOSEC) 20 MG capsule Take 1 capsule (20 mg total) by mouth daily.   ondansetron  (ZOFRAN ) 4 MG tablet Take 1 tablet (4 mg total) by mouth 3 (three) times daily as needed for nausea/vomiting.   triamcinolone  (NASACORT ) 55 MCG/ACT AERO nasal inhaler Place 1 spray into the nose daily. (Patient taking differently: Place 1 spray into the nose 2 (two) times daily.)    Physical Exam:    VS:  BP 108/64   Pulse 68   Ht 5' 11 (1.803 m)   Wt 136 lb (61.7 kg)   SpO2 91%   BMI 18.97 kg/m    Wt Readings from Last 3 Encounters:  05/30/24 136 lb (61.7 kg)  05/23/24 136 lb 9.6 oz (62 kg)  05/17/24 137 lb 9.1 oz (62.4 kg)    GEN: Thin, ill appearing in no acute distress NECK: No JVD; No carotid bruits CARDIAC:  RRR, no murmurs, rubs, gallops RESPIRATORY:  Clear to auscultation without rales, wheezing or rhonchi  ABDOMEN: Soft, non-tender, non-distended EXTREMITIES:  No edema; No acute deformity     Asessement and Plan:.    HFpEF: Echo on 05/08/2024 indicated LVEF 50 to 55%, LV with global hypokinesis, diastolic parameters were normal, RV systolic function and size was normal, moderately elevated PASP, LA was severely dilated, RA severely dilated, trivial mitral valve regurgitation with no evidence of stenosis, aortic valve regurgitation not visualized, no stenosis was present.  Patient was diuresed with IV Lasix  during recent admission, was discharged on Lasix  20 mg daily, reports this has been paused.  Patient reports that his breathing is stable, he uses oxygen  as needed.  Patient appears euvolemic and well compensated on exam.  CVA: Patient with history of ischemic stroke in 04/2023 thought to be related to inconsistent INR while on Coumadin .  Patient was transitioned to Eliquis .  Permanent atrial fibrillation: EKG today indicates atrial fibrillation at 90 bpm.  Patient denies any increased palpitations or feeling of irregular heartbeats.  He denies any bleeding problems on Eliquis .  Continue Eliquis  5 mg twice daily, metoprolol  succinate 25 mg twice daily.  Patient has had progressive weight loss in last 2 years, if he continues to lose weight will need to decrease Eliquis  dosing.  Leukocytosis: Patient with history of intermittent leukocytosis and thrombocytosis, monitored by PCP.  Patient and daughter notes concern with intermittent elevations in blood counts, he has had progressive weight loss in the last 2 years.  Patient to have repeat lab work next week with PCP, is also followed by infectious disease.  If patient has persistent abnormalities would consider referral to hematology/oncology.  Hypertension: Blood pressure today 108/64.  Continue metoprolol , patient reports that he takes 12.5 mg of  hydrochlorothiazide  as  as needed for elevated blood pressure.  He reports he has not had to take since hospital discharge.  On review of patient's home blood pressure log his blood pressure has been well-controlled.  COPD: Patient with history of COPD, followed by pulmonology.  He uses oxygen  2 L nasal cannula as needed and overnight.   Disposition: F/u with Dr. Debera in 06/2024 as scheduled.   Signed, Donnalee Cellucci D Cleopha Indelicato, NP

## 2024-05-28 ENCOUNTER — Encounter (HOSPITAL_BASED_OUTPATIENT_CLINIC_OR_DEPARTMENT_OTHER): Payer: Self-pay | Admitting: Pulmonary Disease

## 2024-05-28 ENCOUNTER — Other Ambulatory Visit: Payer: Self-pay

## 2024-05-28 ENCOUNTER — Other Ambulatory Visit (HOSPITAL_BASED_OUTPATIENT_CLINIC_OR_DEPARTMENT_OTHER): Payer: Self-pay

## 2024-05-29 ENCOUNTER — Other Ambulatory Visit: Payer: Self-pay

## 2024-05-29 NOTE — Telephone Encounter (Signed)
Please advise if CT is still needed.

## 2024-05-30 ENCOUNTER — Other Ambulatory Visit: Payer: Self-pay | Admitting: Family Medicine

## 2024-05-30 ENCOUNTER — Ambulatory Visit: Attending: Cardiology | Admitting: Cardiology

## 2024-05-30 ENCOUNTER — Encounter: Payer: Self-pay | Admitting: Family Medicine

## 2024-05-30 ENCOUNTER — Other Ambulatory Visit (HOSPITAL_BASED_OUTPATIENT_CLINIC_OR_DEPARTMENT_OTHER): Payer: Self-pay

## 2024-05-30 ENCOUNTER — Encounter: Payer: Self-pay | Admitting: Cardiology

## 2024-05-30 VITALS — BP 108/64 | HR 68 | Ht 71.0 in | Wt 136.0 lb

## 2024-05-30 DIAGNOSIS — J449 Chronic obstructive pulmonary disease, unspecified: Secondary | ICD-10-CM

## 2024-05-30 DIAGNOSIS — I639 Cerebral infarction, unspecified: Secondary | ICD-10-CM | POA: Diagnosis not present

## 2024-05-30 DIAGNOSIS — I4821 Permanent atrial fibrillation: Secondary | ICD-10-CM

## 2024-05-30 DIAGNOSIS — I10 Essential (primary) hypertension: Secondary | ICD-10-CM

## 2024-05-30 DIAGNOSIS — I5032 Chronic diastolic (congestive) heart failure: Secondary | ICD-10-CM | POA: Diagnosis not present

## 2024-05-30 DIAGNOSIS — I482 Chronic atrial fibrillation, unspecified: Secondary | ICD-10-CM

## 2024-05-30 MED ORDER — ONDANSETRON HCL 4 MG PO TABS
4.0000 mg | ORAL_TABLET | Freq: Three times a day (TID) | ORAL | 1 refills | Status: AC | PRN
  Filled 2024-05-30: qty 30, 10d supply, fill #0

## 2024-05-30 NOTE — Patient Instructions (Signed)
 Thank you for choosing [facility]!     Medication Instructions:  No medication changes were made during today's visit.  *If you need a refill on your cardiac medications before your next appointment, please call your pharmacy*   Lab Work: No labs were ordered during today's visit.  If you have labs (blood work) drawn today and your tests are completely normal, you will receive your results only by: MyChart Message (if you have MyChart) OR A paper copy in the mail If you have any lab test that is abnormal or we need to change your treatment, we will call you to review the results.   Testing/Procedures: No procedures were ordered during today's visit.    Follow-Up: At [facility], you and your health needs are our priority.  As part of our continuing mission to provide you with exceptional heart care, we have created designated Provider Care Teams.  These Care Teams include your primary Cardiologist (physician) and Advanced Practice Providers (APPs -  Physician Assistants and Nurse Practitioners) who all work together to provide you with the care you need, when you need it. We recommend signing up for the patient portal called MyChart.  Sign up information is provided on this After Visit Summary.  MyChart is used to connect with patients for Virtual Visits (Telemedicine).  Patients are able to view lab/test results, encounter notes, upcoming appointments, etc.  Non-urgent messages can be sent to your provider as well.   To learn more about what you can do with MyChart, go to [email or web address].

## 2024-06-02 ENCOUNTER — Ambulatory Visit: Payer: Self-pay

## 2024-06-02 ENCOUNTER — Telehealth: Payer: Self-pay

## 2024-06-02 ENCOUNTER — Encounter: Payer: Self-pay | Admitting: Family Medicine

## 2024-06-02 NOTE — Telephone Encounter (Signed)
 Received fax from [facility] OT Eval and Plan of Treatment Certification 05/19/24 to 08/10/2024   [name] in box front office

## 2024-06-02 NOTE — Telephone Encounter (Signed)
 Signed and put in box to go up front. Signed:  Gerlene Hockey, MD           06/02/2024

## 2024-06-02 NOTE — Telephone Encounter (Signed)
 Home health orders received 06/02/24 for Matthew Hall Home health initiation orders: Yes.  Home health re-certification orders: No. Patient last seen by ordering physician for this condition: 05/23/24. Must be less than 90 days for re-certification and less than 30 days prior for initiation. Visit must have been for the condition the orders are being placed.  Patient meets criteria for Physician to sign orders: Yes.        Current med list has been attached: Yes        Orders placed on physicians desk for signature: 06/02/24 (date) If patient does not meet criteria for orders to be signed: pt was called to schedule appt. Appt is scheduled for 06/09/24.   Placed on PCP desk to review and sign, if appropriate.   Lonya Johannesen D Micaila Ziemba

## 2024-06-02 NOTE — Progress Notes (Signed)
 Order(s) created erroneously. Erroneous order ID: [id number]  Order moved by: CHART CORRECTION ANALYST, SEVENTEEN  Order move date/time: 06/02/2024 8:24 AM  Source Patient: [id number]  Source Contact: 05/30/2024  Destination Patient: [id number]  Destination Contact: 12/20/2022

## 2024-06-02 NOTE — Telephone Encounter (Signed)
 FYI Only or Action Required?: FYI only for provider: appointment scheduled on 11/25.  Patient was last seen in primary care on 05/23/2024 by [name], [name], MD.  Called Nurse Triage reporting Rash.  Symptoms began several days ago.  Interventions attempted: Nothing.  Symptoms are: gradually worsening.  Triage Disposition: See Physician Within 24 Hours  Patient/caregiver understands and will follow disposition?: Yes     Copied from CRM #[id number]. Topic: Clinical - Red Word Triage >> Jun 02, 2024  4:29 PM Matthew Hall wrote: [facility] that prompted transfer to Nurse Triage: Spots on the feet and ankles that have spread and are also itching and burning.     Reason for Disposition  Taking Coumadin  (warfarin) or other strong blood thinner, or known bleeding disorder (e.g., thrombocytopenia)  Answer Assessment - Initial Assessment Questions 1. APPEARANCE of RASH: What does the rash look like? What color is it? (Note: It is difficult to assess rash color in people with darker-colored skin. When this situation occurs, simply ask the caller to describe what they see.)     Small spots 2. SIZE: How big are the spots? (e.g., inches, cm; or compare to size of pinhead, tip of pen, eraser, pea)      Small 3. LOCATION: Where is the rash located?     Feet and legs  4. ONSET: When did the rash begin?     Noticed 3 days ago  5. FEVER: Do you have a fever? If Yes, ask: What is your temperature, how was it measured, and when did it start?     No 6. CAUSE: What do you think is causing the rash?     History of vasculitis  7. MEDICAL HISTORY: Do you have any medical problems that can cause easy bruising or bleeding? (e.g., leukemia, liver disease, recent chemotherapy)     No 8. MEDICINES: Do you take any medicines which thin the blood such as: aspirin, heparin, ibuprofen (NSAIDS), Plavix, or Coumadin ?     Yes  Protocols used: Rash - Purple Spots or Dots-A-AH

## 2024-06-03 ENCOUNTER — Encounter: Payer: Self-pay | Admitting: Family Medicine

## 2024-06-03 ENCOUNTER — Other Ambulatory Visit (HOSPITAL_BASED_OUTPATIENT_CLINIC_OR_DEPARTMENT_OTHER): Payer: Self-pay

## 2024-06-03 ENCOUNTER — Ambulatory Visit: Admitting: Family Medicine

## 2024-06-03 VITALS — BP 102/62 | HR 73 | Temp 97.9°F | Wt 140.0 lb

## 2024-06-03 DIAGNOSIS — I776 Arteritis, unspecified: Secondary | ICD-10-CM | POA: Diagnosis not present

## 2024-06-03 MED ORDER — METHYLPREDNISOLONE 4 MG PO TBPK
ORAL_TABLET | ORAL | 0 refills | Status: DC
  Filled 2024-06-03: qty 21, 6d supply, fill #0

## 2024-06-03 MED ORDER — DOXYCYCLINE HYCLATE 100 MG PO TABS
100.0000 mg | ORAL_TABLET | Freq: Two times a day (BID) | ORAL | 0 refills | Status: DC
  Filled 2024-06-03: qty 20, 10d supply, fill #0

## 2024-06-03 NOTE — Telephone Encounter (Signed)
 FYI reviewed

## 2024-06-03 NOTE — Progress Notes (Signed)
 Matthew Hall , 05-17-1946, 78 y.o., male MRN: [id number] Patient Care Team    Relationship Specialty Notifications Start End  [name], [name], MD PCP - General Family Medicine  03/19/24   Debera [name], MD PCP - Cardiology Cardiology  05/30/24   Kendall Mickey Zachary DELENA., MD Consulting Physician Urology  04/16/19   Matthew Harden LULLA, MD Consulting Physician Pulmonary Disease  03/23/24   Debera [name], MD Consulting Physician Cardiology  03/23/24     Chief Complaint  Patient presents with   Rash    Since Friday; both feet. burning sensation.      Subjective: Matthew Hall is a 78 y.o. Pt presents for an OV with care taker, daughter was on the phone, with complaints of spots on feet and ankles of 5 days duration.  Associated symptoms include burning and itching.  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 He is a diabetic.  He was established with rheumatology for this condition in the past. He denies any swelling, bleeding or drainage.  He reports that the area does look like his prior history of vasculitis, except for last time it covered his whole body.  He reports it is only below his knees and on his feet currently.  Left foot worse than right.     03/19/2024    9:26 AM 10/03/2023   10:28 AM 06/13/2018    8:02 AM 12/24/2017    1:51 PM 05/21/2017   11:39 AM  Depression screen PHQ 2/9  Decreased Interest 1 1 1  0 0  Down, Depressed, Hopeless 0 0 0 0 0  PHQ - 2 Score 1 1 1  0 0  Altered sleeping 1 1 1 1    Tired, decreased energy 1 1 1 2    Change in appetite 1 1 0 2   Feeling bad or failure about yourself  0 0 0 0   Trouble concentrating 0 0 0 0   Moving slowly or fidgety/restless 0 0 0 0   Suicidal thoughts 0 0 0 0   PHQ-9 Score 4  4  3  5     Difficult doing work/chores Not difficult at all Not difficult at all Not difficult at all Somewhat difficult      Data saved with a previous flowsheet row definition     Allergies  Allergen Reactions   Simvastatin Rash   Cortisone Nausea And Vomiting   Albuterol  Palpitations and Other (See Comments)    Pt is okay to take xopenex , albuterol  raises his heart rate   Augmentin [Amoxicillin-Pot Clavulanate] Rash   Ciprofloxacin Rash   Contrast Media [Iodinated Contrast Media] Rash   Flagyl  [Metronidazole ] Rash   Latex Hives and Rash   Sulfa  Antibiotics Rash   Social History   Social History Narrative   Originally from [place] .   Lives with wife.     Retired consulting civil engineer.   He does not drive due to foot drop.   25-pack-year smoking history, quit 2013.   Caffeine- 1 cup a day   Right handed    Past Medical History:  Diagnosis Date   Anemia, chronic disease    Anxiety    Aortic atherosclerosis    Atrial fibrillation (HCC)    Bladder cancer (HCC)    Bronchiectasis (HCC)    Chronic systolic heart failure (HCC)    Colon polyps    COPD (chronic obstructive pulmonary disease) (HCC)  Diabetes mellitus with complication (HCC)    Diabetic peripheral neuropathy (HCC) 12/27/2020   Diverticulosis    Essential hypertension    GERD (gastroesophageal reflux disease)    History of CVA with residual deficit    R foot drop   Nephrolithiasis    OSA (obstructive sleep apnea)    Osteoarthritis    Pulmonary nodule    surveillance by Dr. 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 Vernice who felt like EF was 55 to 60% and  no impairment of LV function, nl RV fxn, Mod tricusp regurg.  04/2024 EF 50-55%, mod pulm HTN   TRANSURETHRAL RESECTION OF BLADDER TUMOR  1996   VIDEO BRONCHOSCOPY N/A 06/19/2023   Procedure: VIDEO BRONCHOSCOPY WITHOUT FLUORO;  Surgeon: Gretta Leita SQUIBB, DO;  Location: [facility];  Service: Cardiopulmonary;  Laterality: N/A;   Family History  Problem Relation Age of Onset   Diabetes Mother    CAD Mother    Emphysema Father    Diabetes Sister    Stroke Sister    Lung cancer Brother    Liver cancer Brother    Colon cancer Neg Hx    Allergies as of 06/03/2024       Reactions   Simvastatin Rash   Cortisone Nausea And Vomiting   Albuterol  Palpitations, Other (See Comments)   Pt is okay to take xopenex , albuterol  raises his heart rate   Augmentin [amoxicillin-pot Clavulanate] Rash   Ciprofloxacin Rash   Contrast Media [iodinated Contrast Media] Rash   Flagyl  [metronidazole ] Rash   Latex Hives, Rash   Sulfa  Antibiotics Rash        Medication List        Accurate as of June 03, 2024 12:44 PM. If you have any questions, ask your nurse or doctor.          ALPRAZolam  0.25 MG tablet Commonly known as: XANAX  Take 1 tablet (0.25 mg total) by mouth 2 (two) times daily as needed for anxiety or sleep.   Breztri  Aerosphere 160-9-4.8 MCG/ACT Aero inhaler Generic drug: budesonide -glycopyrrolate -formoterol  Inhale 2 puffs into the lungs 2 (two) times daily.   busPIRone  10 MG tablet Commonly known as: BUSPAR  Take 1 tablet (10 mg total) by mouth 2 (two) times daily.   doxycycline  100 MG tablet Commonly known as: VIBRA -TABS Take 1 tablet (100 mg total) by mouth 2 (two) times daily. Started by: Rhys Anchondo   Eliquis  5 MG Tabs tablet Generic drug: apixaban  Take 1 tablet (5 mg total) by mouth 2 (two) times daily.   esomeprazole  20 MG capsule Commonly known as: NEXIUM  Take 20 mg by mouth daily at 12 noon.   feeding supplement Liqd Take 237 mLs by mouth 2 (two) times daily  between meals.   Ferrex 150 150 MG capsule Generic drug: iron  polysaccharides Take 1 capsule (150 mg total) by mouth daily.   furosemide  20  MG tablet Commonly known as: LASIX  Take 1 tablet (20 mg total) by mouth daily.   guaiFENesin  600 MG 12 hr tablet Commonly known as: MUCINEX  Take 1 tablet (600 mg total) by mouth 2 (two) times daily.   hydrochlorothiazide  12.5 MG tablet Commonly known as: HYDRODIURIL  Take 1 tablet (12.5 mg total) by mouth as needed. What changed: reasons to take this   levalbuterol  0.63 MG/3ML nebulizer solution Commonly known as: Xopenex  Take 3 mLs (0.63 mg total) by nebulization every 6 (six) hours as needed for wheezing or shortness of breath.   levalbuterol  45 MCG/ACT inhaler Commonly known as: XOPENEX  HFA Inhale 2 puffs into the lungs every 6 (six) hours as needed for wheezing.   metFORMIN  500 MG 24 hr tablet Commonly known as: GLUCOPHAGE -XR Take 1 tablet (500 mg total) by mouth daily. What changed:  when to take this additional instructions   methylPREDNISolone  4 MG Tbpk tablet Commonly known as: MEDROL  DOSEPAK Per pack instructions Started by: Charlies Bellini   metoprolol  succinate 25 MG 24 hr tablet Commonly known as: TOPROL -XL Take 1 tablet (25 mg total) by mouth 2 (two) times daily.   omeprazole  20 MG capsule Commonly known as: PRILOSEC Take 1 capsule (20 mg total) by mouth daily.   ondansetron  4 MG tablet Commonly known as: ZOFRAN  Take 1 tablet (4 mg total) by mouth 3 (three) times daily as needed for nausea/vomiting.   triamcinolone  55 MCG/ACT Aero nasal inhaler Commonly known as: NASACORT  Place 1 spray into the nose daily. What changed: when to take this   Tylenol  325 MG Caps Generic drug: Acetaminophen  Take 1-2 capsules by mouth every 6 (six) hours as needed (pain).   VITAMIN B12 PO Take 1 tablet by mouth daily.        All past medical history, surgical history, allergies, family history, immunizations andmedications  were updated in the EMR today and reviewed under the history and medication portions of their EMR.     ROS Negative, with the exception of above mentioned in HPI   Objective:  BP 102/62   Pulse 73   Temp 97.9 F (36.6 C)   Wt 140 lb (63.5 kg)   SpO2 97%   BMI 19.53 kg/m  Body mass index is 19.53 kg/m. Physical Exam Vitals and nursing note reviewed. Exam conducted with a chaperone present.  Constitutional:      General: He is not in acute distress.    Appearance: Normal appearance. He is not ill-appearing, toxic-appearing or diaphoretic.  HENT:     Head: Normocephalic and atraumatic.  Eyes:     General: No scleral icterus.       Right eye: No discharge.        Left eye: No discharge.     Extraocular Movements: Extraocular movements intact.     Pupils: Pupils are equal, round, and reactive to light.  Skin:    General: Skin is warm and dry.     Coloration: Skin is not jaundiced or pale.     Findings: No rash.     Comments: B/l LE: Small flat blanchable spots bilateral knee to foot.  Left foot> right.  Mild tenderness to palpation.  No bleeding.  No drainage.  No ulcerations.  Neurological:     Mental Status: He is alert and oriented to person, place, and time. Mental status is at baseline.  Psychiatric:        Mood and Affect: Mood normal.        Behavior: Behavior normal.  Thought Content: Thought content normal.        Judgment: Judgment normal.      No results found. No results found. No results found for this or any previous visit (from the past 24 hours).  Assessment/Plan: Matthew Hall is a 78 y.o. male present for OV for  1. Vasculitis (Primary) Consistent with prior presentation of vasculitis. Started doxycycline  100 mg twice daily and Medrol  Dosepak.  This seems to have worked well for him in the past. He has follow-up with his PCP next week.  Reviewed expectations re: course of current medical issues. Discussed self-management of  symptoms. Outlined signs and symptoms indicating need for more acute intervention. Patient verbalized understanding and all questions were answered. Patient received an After-Visit Summary.    No orders of the defined types were placed in this encounter.  Meds ordered this encounter  Medications   doxycycline  (VIBRA -TABS) 100 MG tablet    Sig: Take 1 tablet (100 mg total) by mouth 2 (two) times daily.    Dispense:  20 tablet    Refill:  0   methylPREDNISolone  (MEDROL  DOSEPAK) 4 MG TBPK tablet    Sig: Per pack instructions    Dispense:  21 each    Refill:  0   Referral Orders  No referral(s) requested today     Note is dictated utilizing voice recognition software. Although note has been proof read prior to signing, occasional typographical errors still can be missed. If any questions arise, please do not hesitate to call for verification.   electronically signed by:  Charlies Bellini, DO  [facility] - [place]

## 2024-06-06 ENCOUNTER — Other Ambulatory Visit: Payer: Self-pay

## 2024-06-09 ENCOUNTER — Ambulatory Visit: Admitting: Family Medicine

## 2024-06-09 ENCOUNTER — Encounter: Payer: Self-pay | Admitting: Family Medicine

## 2024-06-09 VITALS — BP 105/64 | HR 81 | Temp 97.6°F | Ht 71.0 in | Wt 140.8 lb

## 2024-06-09 DIAGNOSIS — D72829 Elevated white blood cell count, unspecified: Secondary | ICD-10-CM

## 2024-06-09 DIAGNOSIS — E8809 Other disorders of plasma-protein metabolism, not elsewhere classified: Secondary | ICD-10-CM | POA: Diagnosis not present

## 2024-06-09 DIAGNOSIS — I776 Arteritis, unspecified: Secondary | ICD-10-CM

## 2024-06-09 DIAGNOSIS — D75839 Thrombocytosis, unspecified: Secondary | ICD-10-CM

## 2024-06-09 DIAGNOSIS — I509 Heart failure, unspecified: Secondary | ICD-10-CM | POA: Diagnosis not present

## 2024-06-09 DIAGNOSIS — J9611 Chronic respiratory failure with hypoxia: Secondary | ICD-10-CM | POA: Diagnosis not present

## 2024-06-09 DIAGNOSIS — D649 Anemia, unspecified: Secondary | ICD-10-CM

## 2024-06-09 LAB — CBC WITH DIFFERENTIAL/PLATELET
Basophils Absolute: 0 K/uL (ref 0.0–0.1)
Basophils Relative: 0.3 % (ref 0.0–3.0)
Eosinophils Absolute: 0.3 K/uL (ref 0.0–0.7)
Eosinophils Relative: 2 % (ref 0.0–5.0)
HCT: 36.5 % — ABNORMAL LOW (ref 39.0–52.0)
Hemoglobin: 11.6 g/dL — ABNORMAL LOW (ref 13.0–17.0)
Lymphocytes Relative: 10.6 % — ABNORMAL LOW (ref 12.0–46.0)
Lymphs Abs: 1.6 K/uL (ref 0.7–4.0)
MCHC: 31.9 g/dL (ref 30.0–36.0)
MCV: 86.2 fl (ref 78.0–100.0)
Monocytes Absolute: 1.5 K/uL — ABNORMAL HIGH (ref 0.1–1.0)
Monocytes Relative: 10 % (ref 3.0–12.0)
Neutro Abs: 11.4 K/uL — ABNORMAL HIGH (ref 1.4–7.7)
Neutrophils Relative %: 77.1 % — ABNORMAL HIGH (ref 43.0–77.0)
Platelets: 427 K/uL — ABNORMAL HIGH (ref 150.0–400.0)
RBC: 4.24 Mil/uL (ref 4.22–5.81)
RDW: 15.1 % (ref 11.5–15.5)
WBC: 14.8 K/uL — ABNORMAL HIGH (ref 4.0–10.5)

## 2024-06-09 NOTE — Progress Notes (Signed)
 OFFICE VISIT  06/09/2024  CC:  Chief Complaint  Patient presents with   Medical Management of Chronic Issues    Patient is a 78 y.o. male who presents for 2-week follow-up failure to thrive/debilitated, chronic hypoxic respiratory failure, chronic congestive heart failure. A/P as of last visit: 1 failure to thrive, malnutrition.  Mildly chronic in nature but recently had a prolonged period of poor food and drink intake due to having some dental work done.  He is getting through this. It is hard to tell if he had any significant acute cardiopulmonary conditions contributing to the way he felt recently. He apparently did have an episode of aspiration when taking a pill in the hospital.  He was put on some antibiotics and has finished these, resp at baseline. He is on his normal home oxygen  regimen of 2 L intermittently.  Appears euvolemic. Since discharge home his blood pressures have ranged 109-121 systolic over 66-73 diastolic.  Heart rate 70-93.  Weight 136 to 137.5 pounds. Will check CBC and metabolic panel today. No medication changes today.  INTERIM HX: He saw Dr. 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  He was put on doxycycline  and Medrol  Dosepak. He feels much much improved.  Breathing feels good/stable.  He is on room air and says O2 sat 95% or better consistently. Denies any lower extremity swelling lately. Appetite is good, no problems chewing or swallowing.  ROS as above, plus--> no fevers, no CP, no wheezing, no cough, no dizziness, no HAs, no rashes, no melena/hematochezia.  No polyuria or polydipsia.  No myalgias or arthralgias.  No focal weakness, paresthesias, or tremors.  No acute vision or hearing abnormalities.  No dysuria or unusual/new urinary urgency or frequency.  No n/v/d or abd pain.  No palpitations.    Past Medical History:  Diagnosis Date   Anemia, chronic disease    Anxiety    Aortic atherosclerosis     Atrial fibrillation (HCC)    Bladder cancer (HCC)    Bronchiectasis (HCC)    Chronic systolic heart failure (HCC)    Colon polyps    COPD (chronic obstructive pulmonary disease) (HCC)    Diabetes mellitus with complication (HCC)    Diabetic peripheral neuropathy (HCC) 12/27/2020   Diverticulosis    Essential hypertension    GERD (gastroesophageal reflux disease)    History of CVA with residual deficit    R foot drop   Nephrolithiasis    OSA (obstructive sleep apnea)    Osteoarthritis    Pulmonary nodule    surveillance by Dr. 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 Vernice who felt like EF was 55 to 60% and no impairment of LV function, nl RV fxn, Mod tricusp regurg.  04/2024 EF 50-55%, global LV hypokinesis,mod pulm HTN   TRANSURETHRAL RESECTION OF BLADDER TUMOR  1996   VIDEO BRONCHOSCOPY N/A 06/19/2023   Procedure: VIDEO BRONCHOSCOPY WITHOUT FLUORO;  Surgeon: Gretta Leita SQUIBB, DO;  Location: [facility];  Service: Cardiopulmonary;  Laterality: N/A;    Outpatient Medications Prior to Visit  Medication Sig Dispense Refill   Acetaminophen  (TYLENOL ) 325 MG CAPS Take 1-2 capsules by mouth every 6 (six) hours as needed (pain).     ALPRAZolam  (XANAX ) 0.25 MG tablet Take 1 tablet (0.25 mg total) by mouth 2 (two) times daily as needed for anxiety or sleep. 90 tablet 2   apixaban  (ELIQUIS ) 5 MG TABS tablet Take 1 tablet (5 mg total) by mouth 2 (two) times daily. 180  tablet 1   budesonide -glycopyrrolate -formoterol  (BREZTRI  AEROSPHERE) 160-9-4.8 MCG/ACT AERO inhaler Inhale 2 puffs into the lungs 2 (two) times daily. 10.7 g 11   busPIRone  (BUSPAR ) 10 MG tablet Take 1 tablet (10 mg total) by mouth 2 (two) times daily. 180 tablet 1   Cyanocobalamin  (VITAMIN B12 PO) Take 1 tablet by mouth daily.     esomeprazole  (NEXIUM ) 20 MG capsule Take 20 mg by mouth daily at 12 noon.     feeding supplement (ENSURE ENLIVE / ENSURE PLUS) LIQD Take 237 mLs by mouth 2 (two) times daily between meals.     guaiFENesin  (MUCINEX ) 600 MG 12 hr tablet Take 1 tablet (600 mg total) by mouth 2 (two) times daily. 60 tablet 5   hydrochlorothiazide  (HYDRODIURIL ) 12.5 MG tablet Take 1 tablet (12.5 mg total) by mouth as needed. (Patient taking differently: Take 12.5 mg by mouth as needed (BP > 148 or 150).) 30 tablet 3   iron  polysaccharides (NIFEREX) 150 MG capsule Take 1 capsule (150 mg total) by mouth daily. 90 capsule 0   levalbuterol  (XOPENEX  HFA) 45 MCG/ACT inhaler Inhale 2 puffs into the lungs every 6 (six) hours as needed for wheezing. 15 g 3   levalbuterol  (XOPENEX ) 0.63 MG/3ML nebulizer solution Take 3 mLs (0.63 mg total) by nebulization every 6 (six) hours as needed for wheezing or shortness of breath. 270 mL 5   metFORMIN  (GLUCOPHAGE -XR) 500 MG 24 hr tablet Take 1 tablet (500 mg total) by mouth daily. (Patient taking differently: Take 500 mg by mouth See admin instructions. Take 1 tablet by mouth every 2 - 3 days.) 90 tablet 3   metoprolol  succinate (TOPROL -XL) 25 MG 24 hr tablet Take 1 tablet (25 mg total) by mouth 2 (two) times daily. 180 tablet 2   omeprazole  (PRILOSEC) 20 MG capsule Take 1 capsule (20 mg total) by mouth daily. 90 capsule 2   ondansetron  (ZOFRAN ) 4 MG tablet Take 1 tablet (4 mg total) by mouth 3 (three) times daily as needed for nausea/vomiting. 30 tablet 1   triamcinolone  (NASACORT ) 55 MCG/ACT AERO nasal inhaler Place 1 spray into the nose daily. (Patient taking  differently: Place 1 spray into the nose 2 (two) times daily.) 16.9 mL 5   furosemide  (LASIX ) 20 MG tablet Take 1 tablet (20 mg total) by mouth daily. (Patient not taking: Reported on 06/09/2024) 60 tablet 0   doxycycline  (VIBRA -TABS) 100 MG tablet Take 1 tablet (100 mg total) by mouth 2 (two) times daily. 20 tablet 0   methylPREDNISolone  (MEDROL  DOSEPAK) 4 MG TBPK tablet Per pack instructions 21 each 0   No facility-administered medications prior to visit.    Allergies  Allergen Reactions   Simvastatin Rash   Cortisone Nausea And Vomiting   Albuterol  Palpitations and Other (See Comments)    Pt is okay to take xopenex , albuterol   raises his heart rate   Augmentin [Amoxicillin-Pot Clavulanate] Rash   Ciprofloxacin Rash   Contrast Media [Iodinated Contrast Media] Rash   Flagyl  [Metronidazole ] Rash   Latex Hives and Rash   Sulfa  Antibiotics Rash    Review of Systems As per HPI  PE:    06/09/2024    1:32 PM 06/03/2024   11:44 AM 05/30/2024    2:09 PM  Vitals with BMI  Height 5' 11  5' 11  Weight 140 lbs 13 oz 140 lbs 136 lbs  BMI 19.65 19.53 18.98  Systolic 105 102 891  Diastolic 64 62 64  Pulse 81 73 68     Physical Exam  Gen: Alert, well appearing.  Patient is oriented to person, place, time, and situation. AFFECT: pleasant, lucid thought and speech. Cardiovascular: Irregularly irregular, rate 80.  No murmur. Lungs have soft inspiratory crackles bilaterally, more so in the lower lung fields.  No wheezing.  Expiratory phase is not significantly prolonged. He has some pursed lipped breathing.  Nonlabored breathing. Extremities: No pitting edema except on the top of the left foot.  There is a small splotch of nonpalpable and nonblanchable violaceous macules on the lateral aspect of the dorsum of the left foot.  Nontender.  There is a mild amount of erythema around it.  No warmth, no streaking.  No skin breakdown.  LABS:  Last CBC Lab Results  Component Value Date   WBC  16.1 (H) 05/23/2024   HGB 11.0 (L) 05/23/2024   HCT 34.7 (L) 05/23/2024   MCV 87.9 05/23/2024   MCH 28.1 05/17/2024   RDW 15.1 05/23/2024   PLT 729.0 (H) 05/23/2024   Lab Results  Component Value Date   IRON  18 (L) 05/09/2024   TIBC 262 05/09/2024   FERRITIN 130 05/09/2024   Last metabolic panel Lab Results  Component Value Date   GLUCOSE 99 05/23/2024   NA 140 05/23/2024   K 4.7 05/23/2024   CL 98 05/23/2024   CO2 33 (H) 05/23/2024   BUN 19 05/23/2024   CREATININE 0.78 05/23/2024   GFR 84.47 05/23/2024   CALCIUM  9.3 05/23/2024   PHOS 2.8 05/09/2024   PROT 6.5 05/14/2024   ALBUMIN 2.1 (L) 05/14/2024   LABGLOB 3.3 05/09/2023   LABGLOB 2.8 05/09/2023   BILITOT 0.4 05/14/2024   ALKPHOS 88 05/14/2024   AST 16 05/14/2024   ALT 21 05/14/2024   ANIONGAP 12 05/17/2024   Last lipids Lab Results  Component Value Date   CHOL 118 09/10/2023   HDL 31.40 (L) 09/10/2023   LDLCALC 69 09/10/2023   TRIG 89.0 09/10/2023   CHOLHDL 4 09/10/2023   Last hemoglobin A1c Lab Results  Component Value Date   HGBA1C 5.9 (A) 03/19/2024   HGBA1C 5.9 03/19/2024   HGBA1C 5.9 03/19/2024   HGBA1C 5.9 03/19/2024   Last thyroid  functions Lab Results  Component Value Date   TSH 1.36 09/10/2023   Last vitamin D  Lab Results  Component Value Date   VD25OH 81.52 09/10/2023   Last vitamin B12 and Folate Lab Results  Component Value Date   VITAMINB12 >1537 (H) 09/10/2023   FOLATE 8.8 09/10/2023   IMPRESSION AND PLAN:  #1 debilitated patient. Getting much much better, great p.o. intake lately. He restarted rehab a couple of days ago.  #2 chronic hypoxic respiratory failure. He actually has no hypoxia at rest anymore.  He has O2 to use at home as needed. He is followed by pulmonology, Dr. Jude. He will continue Breztri   daily and Xopenex  as needed.  #3 heart failure with preserved ejection fraction. Appears euvolemic today. Continue Toprol -XL 25 mg twice daily.  He has Lasix  for  as needed use.  4.  Hypertension. He is no longer requiring daily dose of HCTZ. He will continue Toprol -XL 25 mg twice daily.  #5 diabetes with complication. Good control.  He takes metformin  XR 500 mg, 1 tab every 2 to 3 days typically. We will do next hemoglobin A1c at follow-up in 1 month.  #6 vasculitis of lower extremities. Resolved with doxycycline  and Medrol  Dosepak.  #7 leukocytosis and thrombocytosis--> onset approximately 6 weeks ago.  Unknown etiology. Initially detected when he started getting ill after getting his teeth pulled in early October. Monitor CBC with differential today. Ref hem/onc if leukocytosis and thrombocytosis not signif improved on today's bloodwork.  An After Visit Summary was printed and given to the patient.  FOLLOW UP: Return in about 4 weeks (around 07/07/2024) for f/u DM.  Signed:  Gerlene Hockey, MD           06/09/2024

## 2024-06-10 ENCOUNTER — Telehealth: Payer: Self-pay

## 2024-06-10 NOTE — Telephone Encounter (Signed)
 Received fax from [facility] Request for PT and OT Evals   [name] in box front office

## 2024-06-10 NOTE — Telephone Encounter (Signed)
 Home health orders received 06/10/24 for Helayne LULLA Shams Home health initiation orders: Yes.  Home health re-certification orders: No. Patient last seen by ordering physician for this condition: 06/09/24. Must be less than 90 days for re-certification and less than 30 days prior for initiation. Visit must have been for the condition the orders are being placed.  Patient meets criteria for Physician to sign orders: Yes.        Current med list has been attached: No        Orders placed on physicians desk for signature: 06/10/24 (date) If patient does not meet criteria for orders to be signed: pt was called to schedule appt. Appt is scheduled for 07/08/24.    Placed on PCP desk to review and sign, if appropriate.  Travia Onstad D Karmela Bram

## 2024-06-11 ENCOUNTER — Other Ambulatory Visit (HOSPITAL_BASED_OUTPATIENT_CLINIC_OR_DEPARTMENT_OTHER): Payer: Self-pay

## 2024-06-11 ENCOUNTER — Other Ambulatory Visit: Payer: Self-pay

## 2024-06-11 NOTE — Telephone Encounter (Signed)
 Signed and put in box to go up front. Signed:  Santiago Bumpers, MD           08/02/2023

## 2024-06-12 ENCOUNTER — Ambulatory Visit: Payer: Self-pay | Admitting: Family Medicine

## 2024-06-12 NOTE — Telephone Encounter (Signed)
 No further action needed at this time.

## 2024-06-13 ENCOUNTER — Ambulatory Visit (HOSPITAL_BASED_OUTPATIENT_CLINIC_OR_DEPARTMENT_OTHER): Admission: RE | Admit: 2024-06-13 | Source: Ambulatory Visit

## 2024-06-13 ENCOUNTER — Other Ambulatory Visit (HOSPITAL_BASED_OUTPATIENT_CLINIC_OR_DEPARTMENT_OTHER): Payer: Self-pay

## 2024-06-15 ENCOUNTER — Other Ambulatory Visit: Payer: Self-pay

## 2024-06-16 ENCOUNTER — Other Ambulatory Visit (HOSPITAL_BASED_OUTPATIENT_CLINIC_OR_DEPARTMENT_OTHER): Payer: Self-pay

## 2024-06-16 ENCOUNTER — Other Ambulatory Visit (HOSPITAL_COMMUNITY): Payer: Self-pay

## 2024-06-16 ENCOUNTER — Other Ambulatory Visit: Payer: Self-pay

## 2024-06-17 ENCOUNTER — Encounter: Payer: Self-pay | Admitting: Family Medicine

## 2024-06-17 ENCOUNTER — Telehealth: Payer: Self-pay

## 2024-06-17 ENCOUNTER — Other Ambulatory Visit (HOSPITAL_COMMUNITY): Payer: Self-pay

## 2024-06-17 NOTE — Telephone Encounter (Signed)
 Received fax from [facility]   OT / PT Plan of Care   [name] inbox front office

## 2024-06-18 ENCOUNTER — Ambulatory Visit: Admitting: Family Medicine

## 2024-06-18 ENCOUNTER — Other Ambulatory Visit (HOSPITAL_COMMUNITY): Payer: Self-pay

## 2024-06-18 MED ORDER — FLUZONE HIGH-DOSE 0.5 ML IM SUSY
0.5000 mL | PREFILLED_SYRINGE | Freq: Once | INTRAMUSCULAR | 0 refills | Status: AC
  Filled 2024-06-18: qty 0.5, 1d supply, fill #0

## 2024-06-18 NOTE — Telephone Encounter (Signed)
 Signed and put in box to go up front. Signed:  Gerlene Hockey, MD           06/18/2024

## 2024-06-18 NOTE — Telephone Encounter (Signed)
 Home health orders received 06/18/24 for Matthew Hall Home health initiation orders: Yes.  Home health re-certification orders: No. Patient last seen by ordering physician for this condition: 06/09/24. Must be less than 90 days for re-certification and less than 30 days prior for initiation. Visit must have been for the condition the orders are being placed.  Patient meets criteria for Physician to sign orders: Yes.        Current med list has been attached: Yes        Orders placed on physicians desk for signature: 06/09/24 (date) If patient does not meet criteria for orders to be signed: pt was called to schedule appt. Appt is scheduled for n/a.   Shanda LELON Sharps

## 2024-06-19 ENCOUNTER — Ambulatory Visit: Admitting: Internal Medicine

## 2024-06-20 ENCOUNTER — Other Ambulatory Visit: Payer: Self-pay | Admitting: Family Medicine

## 2024-06-20 ENCOUNTER — Other Ambulatory Visit: Payer: Self-pay

## 2024-06-20 ENCOUNTER — Other Ambulatory Visit (HOSPITAL_COMMUNITY): Payer: Self-pay

## 2024-06-20 ENCOUNTER — Other Ambulatory Visit: Payer: Self-pay | Admitting: Urgent Care

## 2024-06-20 DIAGNOSIS — J441 Chronic obstructive pulmonary disease with (acute) exacerbation: Secondary | ICD-10-CM

## 2024-06-20 MED ORDER — OMEPRAZOLE 20 MG PO CPDR
20.0000 mg | DELAYED_RELEASE_CAPSULE | Freq: Every day | ORAL | 0 refills | Status: AC
  Filled 2024-06-20: qty 30, 30d supply, fill #0
  Filled 2024-07-22: qty 30, 30d supply, fill #1

## 2024-06-20 MED FILL — Metformin HCl Tab ER 24HR 500 MG: ORAL | 30 days supply | Qty: 30 | Fill #4 | Status: AC

## 2024-06-22 ENCOUNTER — Other Ambulatory Visit: Payer: Self-pay

## 2024-06-23 ENCOUNTER — Other Ambulatory Visit: Payer: Self-pay

## 2024-06-24 ENCOUNTER — Other Ambulatory Visit: Payer: Self-pay

## 2024-06-24 ENCOUNTER — Encounter: Payer: Self-pay | Admitting: Internal Medicine

## 2024-06-24 ENCOUNTER — Ambulatory Visit: Admitting: Internal Medicine

## 2024-06-24 VITALS — BP 127/70 | HR 81 | Temp 97.5°F | Resp 16

## 2024-06-24 DIAGNOSIS — D72829 Elevated white blood cell count, unspecified: Secondary | ICD-10-CM

## 2024-06-24 DIAGNOSIS — J470 Bronchiectasis with acute lower respiratory infection: Secondary | ICD-10-CM

## 2024-06-24 NOTE — Progress Notes (Signed)
 [facility]  Patient Active Problem List   Diagnosis Date Noted   Protein-calorie malnutrition, severe 05/08/2024   Cognitive impairment 01/08/2024   Aphasia 10/28/2023   Lung nodule 10/28/2023   Chronic respiratory failure with hypoxia (HCC) 10/25/2023   Gait abnormality 09/24/2023   DM2 (diabetes mellitus, type 2) (HCC) 07/13/2023   GAD (generalized anxiety disorder) 07/13/2023   Chronic heart failure with mildly reduced ejection fraction (HFmrEF, 41-49%) (HCC) 07/13/2023   Paronychia of great toe of left foot 07/02/2023   Weakness of right foot 05/09/2023   History of CVA (cerebrovascular accident) 04/12/2023   Anxiety 04/12/2023   Diabetic peripheral neuropathy (HCC) 12/27/2020   Elevated sed rate 05/21/2017   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   MRN: [id number]  Chief Complaint  Patient presents with   Follow-up    Bronchiectasis without complication    HPI:  Matthew Hall is a 78 y.o. male with bronchiectasis  Patient recent bronchiectasis admission late 04/2024 and afb sputum obtained which he is here to f/u Was being seen by me for bronchiectasis/concern for ntm  Was treated for aspiration pna 7 days and fluconazole  for thrush  He is still weak from admission and starting doing pt/ot  Appetite is good  Not on any antibiotics at this time  He asked about mildly elevated wbc which has been stable since 04/2024 (new since then as well) along with thrombocytosis  No f/c, nightsweat, cough No rash, itch   Allergies[1]    Outpatient Medications Prior to Visit  Medication Sig Dispense Refill   Acetaminophen  (TYLENOL ) 325 MG CAPS  Take 1-2 capsules by mouth every 6 (six) hours as needed (pain).     ALPRAZolam  (XANAX ) 0.25 MG tablet Take 1 tablet (0.25 mg total) by mouth 2 (two) times daily as needed for anxiety or sleep. 90 tablet 2   apixaban  (ELIQUIS ) 5 MG TABS tablet Take 1 tablet (5 mg total) by mouth 2 (two) times daily. 180 tablet 1   budesonide -glycopyrrolate -formoterol  (BREZTRI  AEROSPHERE) 160-9-4.8 MCG/ACT AERO inhaler Inhale 2 puffs into the lungs 2 (two) times daily. 10.7 g 11   busPIRone  (BUSPAR ) 10 MG tablet Take 1 tablet (10 mg total) by mouth 2 (two) times daily. 180 tablet 1   Cyanocobalamin  (VITAMIN B12 PO) Take 1 tablet by mouth daily.     esomeprazole  (NEXIUM ) 20 MG capsule Take 20 mg by mouth daily at 12 noon.     feeding supplement (ENSURE ENLIVE / ENSURE PLUS) LIQD Take 237 mLs by mouth 2 (two) times daily between meals.     guaiFENesin  (MUCINEX ) 600 MG 12 hr tablet Take 1 tablet (600 mg total) by mouth 2 (two) times daily. 60 tablet 5   hydrochlorothiazide  (HYDRODIURIL ) 12.5 MG tablet Take 1 tablet (12.5 mg total) by mouth as needed. (Patient taking differently: Take 12.5 mg by mouth as needed (BP > 148 or 150).) 30 tablet 3   iron  polysaccharides (NIFEREX) 150 MG capsule Take 1 capsule (150 mg total) by mouth daily. 90 capsule 0   levalbuterol  (XOPENEX  HFA) 45 MCG/ACT inhaler Inhale 2 puffs into the lungs every 6 (six) hours as needed for wheezing. 15 g  3   levalbuterol  (XOPENEX ) 0.63 MG/3ML nebulizer solution Take 3 mLs (0.63 mg total) by nebulization every 6 (six) hours as needed for wheezing or shortness of breath. 270 mL 5   metFORMIN  (GLUCOPHAGE -XR) 500 MG 24 hr tablet Take 1 tablet (500 mg total) by mouth daily. (Patient taking differently: Take 500 mg by mouth See admin instructions. Take 1 tablet by mouth every 2 - 3 days.) 90 tablet 3   metoprolol  succinate (TOPROL -XL) 25 MG 24 hr tablet Take 1 tablet (25 mg total) by mouth 2 (two) times daily. 180 tablet 2   omeprazole  (PRILOSEC) 20 MG capsule  Take 1 capsule (20 mg total) by mouth daily. 90 capsule 0   ondansetron  (ZOFRAN ) 4 MG tablet Take 1 tablet (4 mg total) by mouth 3 (three) times daily as needed for nausea/vomiting. 30 tablet 1   triamcinolone  (NASACORT ) 55 MCG/ACT AERO nasal inhaler Place 1 spray into the nose daily. (Patient taking differently: Place 1 spray into the nose 2 (two) times daily.) 16.9 mL 5   furosemide  (LASIX ) 20 MG tablet Take 1 tablet (20 mg total) by mouth daily. (Patient not taking: Reported on 06/09/2024) 60 tablet 0   No facility-administered medications prior to visit.     Social History   Socioeconomic History   Marital status: Married    Spouse name: Not on file   Number of children: 3   Years of education: Not on file   Highest education level: Not on file  Occupational History   Occupation: Pipefilter/retired    Comment: [employer]  Tobacco Use   Smoking status: Former    Current packs/day: 0.00    Average packs/day: 1 pack/day for 30.0 years (30.0 ttl pk-yrs)    Types: Cigarettes    Start date: 07/06/1981    Quit date: 07/07/2011    Years since quitting: 12.9   Smokeless tobacco: Never  Vaping Use   Vaping status: Never Used  Substance and Sexual Activity   Alcohol  use: No    Alcohol /week: 0.0 standard drinks of alcohol    Drug use: No   Sexual activity: Not Currently  Other Topics Concern   Not on file  Social History Narrative   Originally from [place] .   Lives with wife.     Retired consulting civil engineer.   He does not drive due to foot drop.   25-pack-year smoking history, quit 2013.   Caffeine- 1 cup a day   Right handed    Social Drivers of Health   Tobacco Use: Medium Risk (06/24/2024)   Patient History    Smoking Tobacco Use: Former    Smokeless Tobacco Use: Never    Passive Exposure: Not on Actuary Strain: Not on file  Food Insecurity: No Food Insecurity (05/06/2024)   Epic    Worried About Programme Researcher, Broadcasting/film/video in the Last Year: Never  true    Ran Out of Food in the Last Year: Never true  Transportation Needs: No Transportation Needs (05/06/2024)   Epic    Lack of Transportation (Medical): No    Lack of Transportation (Non-Medical): No  Physical Activity: Not on file  Stress: Not on file  Social Connections: Moderately Isolated (05/06/2024)   Social Connection and Isolation Panel    Frequency of Communication with Friends and Family: Twice a week    Frequency of Social Gatherings with Friends and Family: Twice a week    Attends Religious Services: Never    Database Administrator or Organizations:  No    Attends Club or Organization Meetings: Never    Marital Status: Married  Catering Manager Violence: Not At Risk (05/06/2024)   Epic    Fear of Current or Ex-Partner: No    Emotionally Abused: No    Physically Abused: No    Sexually Abused: No  Depression (PHQ2-9): Low Risk (03/19/2024)   Depression (PHQ2-9)    PHQ-2 Score: 4  Alcohol  Screen: Not on file  Housing: Low Risk (05/06/2024)   Epic    Unable to Pay for Housing in the Last Year: No    Number of Times Moved in the Last Year: 0    Homeless in the Last Year: No  Utilities: Not At Risk (05/06/2024)   Epic    Threatened with loss of utilities: No  Health Literacy: Not on file      Review of Systems    All other ros negative Objective:    BP 127/70   Pulse 81   Temp (!) 97.5 F (36.4 C) (Oral)   Resp 16   SpO2 98%  Nursing note and vital signs reviewed.  Physical Exam     General/constitutional: no distress, pleasant HEENT: Normocephalic, PER, Conj Clear, EOMI, Oropharynx clear Neck supple CV: rrr no mrg Lungs: clear to auscultation, normal respiratory effort Abd: Soft, Nontender Ext: no edema Skin: No Rash Neuro: nonfocal MSK: no peripheral joint swelling/tenderness/warmth; back spines nontender    Labs:  Micro:  Serology:  Imaging: Reviewed  05/07/24 chest ct 1. No new focal lung consolidation. 2. Trace bilateral  pleural effusions. 3. 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 If patient is 78 years old and emphysema is present, consider evaluation for a low-dose CT lung cancer screening program. 4. Stable bronchiectasis, bronchial wall thickening, and mucus plugging peripherally, predominantly in the right lower lobe, lingula, and right middle lobe. 5. Right lower lobe solid pulmonary nodule measuring 5 mm, unchanged. For an incidental solid pulmonary nodule 5 mm, no routine follow-up imaging is recommended per Fleischner Society Guidelines (applicable if patient is 78 years old, without cancer history or immunocompromise).   Assessment & Plan:   Problem List Items Addressed This Visit   None Visit Diagnoses       Bronchiectasis with acute lower respiratory infection (HCC)    -  Primary     Leukocytosis, unspecified type             No orders of the defined types were placed in this encounter.  78 yo male here for f/u recent admission late 04/2024 for pneumonia/bronchiectasis exacerbation; an afb sputum was checked in setting of bronchiectasis which he is follow up for    05/12/24 and 05/14/24 expectorated sputum afb smear/cx negative to date  Repeat chest ct 04/2024 showed stable bronchiectasis changes  Wbc up since 04/2024 stable though. If continues to be elevated for the next few months will need hematology evaluation for primary marrow disorder  He is on inhaled steroid for his bronchiectasis but not new medication -- has been on at least a year   Follow up pcp 3 months for labs recheck  See me in 06/2025 or id clinic sooner if concern for recurrent pneumonia       Follow-up: Return in about 1 year (around 06/24/2025).      Constance ONEIDA Passer, MD [facility] 06/24/2024, 9:38 AM      [1]  Allergies Allergen Reactions   Simvastatin  Rash   Cortisone  Nausea And Vomiting   Albuterol  Palpitations and Other (See Comments)    Pt is okay to take xopenex , albuterol  raises his heart rate   Augmentin [Amoxicillin-Pot Clavulanate] Rash   Ciprofloxacin Rash   Contrast Media [Iodinated Contrast Media] Rash   Flagyl  [Metronidazole ] Rash   Latex Hives and Rash   Sulfa  Antibiotics Rash

## 2024-06-24 NOTE — Patient Instructions (Signed)
 Please see your regular primary care doctor in 3 months to recheck labs to monitor your white blood cell and platelet   If the white blood cell continues to be elevated/going up probably need to see hematology    See me in 06/2025 for bronchiectasis monitoring for infection. Will plan a ct scan at this time

## 2024-06-27 LAB — ACID FAST CULTURE WITH REFLEXED SENSITIVITIES (MYCOBACTERIA): Acid Fast Culture: NEGATIVE

## 2024-07-01 LAB — ACID FAST CULTURE WITH REFLEXED SENSITIVITIES (MYCOBACTERIA): Acid Fast Culture: NEGATIVE

## 2024-07-02 ENCOUNTER — Encounter: Payer: Self-pay | Admitting: Family Medicine

## 2024-07-04 NOTE — Telephone Encounter (Signed)
 Yes, appt with me

## 2024-07-07 ENCOUNTER — Ambulatory Visit: Attending: Cardiology | Admitting: Cardiology

## 2024-07-07 ENCOUNTER — Encounter: Payer: Self-pay | Admitting: Cardiology

## 2024-07-07 ENCOUNTER — Other Ambulatory Visit: Payer: Self-pay

## 2024-07-07 ENCOUNTER — Other Ambulatory Visit (HOSPITAL_COMMUNITY): Payer: Self-pay

## 2024-07-07 VITALS — BP 122/62 | HR 78 | Ht 71.0 in | Wt 144.2 lb

## 2024-07-07 DIAGNOSIS — I4821 Permanent atrial fibrillation: Secondary | ICD-10-CM | POA: Diagnosis not present

## 2024-07-07 DIAGNOSIS — I5032 Chronic diastolic (congestive) heart failure: Secondary | ICD-10-CM | POA: Diagnosis not present

## 2024-07-07 MED ORDER — FUROSEMIDE 20 MG PO TABS
20.0000 mg | ORAL_TABLET | ORAL | 0 refills | Status: AC | PRN
  Filled 2024-07-07: qty 90, 90d supply, fill #0

## 2024-07-07 NOTE — Telephone Encounter (Signed)
 Pt is scheduled for follow up 12/30 at 10am.

## 2024-07-07 NOTE — Patient Instructions (Addendum)
 Medication Instructions:   Begin Lasix  20mg  as needed for leg swelling Continue all other medications.     Labwork:  none  Testing/Procedures:  none  Follow-Up:  6 months   Any Other Special Instructions Will Be Listed Below (If Applicable).   If you need a refill on your cardiac medications before your next appointment, please call your pharmacy.

## 2024-07-07 NOTE — Progress Notes (Signed)
 "    Cardiology Office Note  Date: 07/07/2024   ID: Abimelec, Grochowski 1946-03-19, MRN [id number]  History of Present Illness: Matthew Hall is an 78 y.o. male last seen in November by Ms. [facility] NP, I reviewed her note.  Our last encounter was in 2022.  He is here for a follow-up visit.  I reviewed his recent interval records.  He tells me that he and his wife moved into an independent living facility in [place] about 3 weeks ago, [address]  He still has property in [place].  From a cardiac perspective, he does not report any palpitations or exertional chest pain.  States that he does have intermittent lower leg edema, but this has been relatively stable without any marked weight change.  No orthopnea or PND.  We went over his medications.  He is on Eliquis  5 mg twice daily for stroke prophylaxis at this point, also remains on Toprol -XL 25 mg twice daily.  He takes HCTZ 12.5 mg only on an as needed basis if his blood pressure is elevated.  No longer on Lasix  which we discussed resuming as an as needed medication for edema.  I went over his echocardiogram from October and interval lab work as well.  Physical Exam: VS:  BP 122/62   Pulse 78   Ht 5' 11 (1.803 m)   Wt 144 lb 3.2 oz (65.4 kg)   SpO2 96%   BMI 20.11 kg/m , BMI Body mass index is 20.11 kg/m.  Wt Readings from Last 3 Encounters:  07/07/24 144 lb 3.2 oz (65.4 kg)  06/09/24 140 lb 12.8 oz (63.9 kg)  06/03/24 140 lb (63.5 kg)    General: Patient appears comfortable at rest. HEENT: Conjunctiva and lids normal. Neck: Supple, no elevated JVP or carotid bruits. Lungs: Clear to auscultation, nonlabored breathing at rest. Cardiac: Irregularly irregular with soft systolic murmur and no gallop.. Extremities: Right foot in brace.  1+ lower leg edema bilaterally.  ECG:  An ECG dated 05/30/2024 was personally reviewed today and demonstrated:  Atrial fibrillation with IVCD and aberrantly conducted  complex.  Labwork: 09/10/2023: TSH 1.36 05/06/2024: Pro Brain Natriuretic Peptide 5,188.0 05/09/2024: Magnesium  2.0 05/11/2024: B Natriuretic Peptide 337.8 05/14/2024: ALT 21; AST 16 05/23/2024: BUN 19; Creatinine, Ser 0.78; Potassium 4.7; Sodium 140 06/09/2024: Hemoglobin 11.6; Platelets 427.0     Component Value Date/Time   CHOL 118 09/10/2023 1513   TRIG 89.0 09/10/2023 1513   HDL 31.40 (L) 09/10/2023 1513   CHOLHDL 4 09/10/2023 1513   VLDL 17.8 09/10/2023 1513   LDLCALC 69 09/10/2023 1513   Other Studies Reviewed Today:  Echocardiogram 05/08/2024:  1. Left ventricular ejection fraction, by estimation, is 50 to 55%. The  left ventricle has low normal function. The left ventricle demonstrates  global hypokinesis. Left ventricular diastolic parameters were normal.   2. Right ventricular systolic function is normal. The right ventricular  size is normal. There is moderately elevated pulmonary artery systolic  pressure. The estimated right ventricular systolic pressure is 46.6 mmHg.   3. Left atrial size was severely dilated.   4. Right atrial size was severely dilated.   5. The mitral valve is degenerative. Trivial mitral valve regurgitation.  No evidence of mitral stenosis. The mean mitral valve gradient is 3.0  mmHg.   6. The aortic valve is normal in structure. Aortic valve regurgitation is  not visualized. No aortic stenosis is present.   7. The inferior vena cava is dilated in size with <  50% respiratory  variability, suggesting right atrial pressure of 15 mmHg.   Assessment and Plan:  1.  Permanent atrial fibrillation.  CHA2DS2-VASc score is 5.  Asymptomatic in terms of palpitations and heart rate adequately controlled today on Toprol -XL 25 mg twice daily.  He is on Eliquis  5 mg twice daily for stroke prophylaxis, reports no spontaneous bleeding problems.  I reviewed his most recent lab work.   2.  Primary hypertension.  States that he takes HCTZ 12.5 mg only on an as-needed  basis at this point.  3.  History of stroke in October 2024.  4.  History of fluid retention and suspected HFpEF per chart review, echocardiogram in October revealed LVEF 50 to 55% and RV contraction normal with moderately elevated RVSP.  Prescribed Lasix  20 mg tablets to be taken as needed for leg swelling.  Continue to track weight.  Disposition:  Follow up 6 months.  Signed, Jayson JUDITHANN Sierras, M.D., F.A.C.C. [facility]

## 2024-07-08 ENCOUNTER — Other Ambulatory Visit: Payer: Self-pay

## 2024-07-08 ENCOUNTER — Encounter: Payer: Self-pay | Admitting: Family Medicine

## 2024-07-08 ENCOUNTER — Ambulatory Visit: Admitting: Family Medicine

## 2024-07-08 ENCOUNTER — Other Ambulatory Visit (HOSPITAL_COMMUNITY): Payer: Self-pay

## 2024-07-08 ENCOUNTER — Ambulatory Visit: Payer: Self-pay | Admitting: Family Medicine

## 2024-07-08 VITALS — BP 139/70 | HR 81 | Temp 97.6°F | Ht 71.0 in | Wt 144.6 lb

## 2024-07-08 DIAGNOSIS — Z7984 Long term (current) use of oral hypoglycemic drugs: Secondary | ICD-10-CM | POA: Diagnosis not present

## 2024-07-08 DIAGNOSIS — E119 Type 2 diabetes mellitus without complications: Secondary | ICD-10-CM | POA: Diagnosis not present

## 2024-07-08 DIAGNOSIS — D72829 Elevated white blood cell count, unspecified: Secondary | ICD-10-CM

## 2024-07-08 DIAGNOSIS — R5381 Other malaise: Secondary | ICD-10-CM | POA: Diagnosis not present

## 2024-07-08 DIAGNOSIS — E1142 Type 2 diabetes mellitus with diabetic polyneuropathy: Secondary | ICD-10-CM

## 2024-07-08 DIAGNOSIS — D75839 Thrombocytosis, unspecified: Secondary | ICD-10-CM | POA: Diagnosis not present

## 2024-07-08 LAB — CBC WITH DIFFERENTIAL/PLATELET
Basophils Absolute: 0.1 K/uL (ref 0.0–0.1)
Basophils Relative: 0.7 % (ref 0.0–3.0)
Eosinophils Absolute: 0.1 K/uL (ref 0.0–0.7)
Eosinophils Relative: 1.1 % (ref 0.0–5.0)
HCT: 34.5 % — ABNORMAL LOW (ref 39.0–52.0)
Hemoglobin: 10.7 g/dL — ABNORMAL LOW (ref 13.0–17.0)
Lymphocytes Relative: 8.3 % — ABNORMAL LOW (ref 12.0–46.0)
Lymphs Abs: 0.9 K/uL (ref 0.7–4.0)
MCHC: 31.1 g/dL (ref 30.0–36.0)
MCV: 84.1 fl (ref 78.0–100.0)
Monocytes Absolute: 1.4 K/uL — ABNORMAL HIGH (ref 0.1–1.0)
Monocytes Relative: 12.7 % — ABNORMAL HIGH (ref 3.0–12.0)
Neutro Abs: 8.7 K/uL — ABNORMAL HIGH (ref 1.4–7.7)
Neutrophils Relative %: 77.2 % — ABNORMAL HIGH (ref 43.0–77.0)
Platelets: 628 K/uL — ABNORMAL HIGH (ref 150.0–400.0)
RBC: 4.1 Mil/uL — ABNORMAL LOW (ref 4.22–5.81)
RDW: 15.4 % (ref 11.5–15.5)
WBC: 11.2 K/uL — ABNORMAL HIGH (ref 4.0–10.5)

## 2024-07-08 LAB — POCT GLYCOSYLATED HEMOGLOBIN (HGB A1C)
HbA1c POC (<> result, manual entry): 6 %
HbA1c, POC (controlled diabetic range): 6 % (ref 0.0–7.0)
HbA1c, POC (prediabetic range): 6 % (ref 5.7–6.4)
Hemoglobin A1C: 6 % — AB (ref 4.0–5.6)

## 2024-07-08 MED ORDER — TAMSULOSIN HCL 0.4 MG PO CAPS
0.4000 mg | ORAL_CAPSULE | Freq: Every day | ORAL | 1 refills | Status: AC
  Filled 2024-07-08 (×2): qty 30, 30d supply, fill #0
  Filled 2024-07-22: qty 30, 30d supply, fill #1

## 2024-07-08 NOTE — Progress Notes (Signed)
 OFFICE VISIT  07/08/2024  CC:  Chief Complaint  Patient presents with   Medical Management of Chronic Issues    Patient is a 78 y.o. male who presents for 1 mo f/u DM and debilitation. A/P as of last visit: 1 debilitated patient. Getting much much better, great p.o. intake lately. He restarted rehab a couple of days ago.   #2 chronic hypoxic respiratory failure. He actually has no hypoxia at rest anymore.  He has O2 to use at home as needed. He is followed by pulmonology, Dr. Jude. He will continue Breztri  daily and Xopenex  as needed.   #3 heart failure with preserved ejection fraction. Appears euvolemic today. Continue Toprol -XL 25 mg twice daily.  He has Lasix  for as needed use.   4.  Hypertension. He is no longer requiring daily dose of HCTZ. He will continue Toprol -XL 25 mg twice daily.   #5 diabetes with complication. Good control.  He takes metformin  XR 500 mg, 1 tab every 2 to 3 days typically. We will do next hemoglobin A1c at follow-up in 1 month.   #6 vasculitis of lower extremities. Resolved with doxycycline  and Medrol  Dosepak.   #7 leukocytosis and thrombocytosis--> onset approximately 6 weeks ago.  Unknown etiology. Initially detected when he started getting ill after getting his teeth pulled in early October. Monitor CBC with differential today. Ref hem/onc if leukocytosis and thrombocytosis not signif improved on today's bloodwork.  INTERIM HX: Matthew Hall continues to feel better and better.  He is eating well. He does have concern about urinating in the nighttime, losing sleep.  Sometimes only pees a small amount.  Always has a sense of needing to go the bathroom at night.  Stream is not very strong.  Cardiology f/u yesterday:  no changes were made, no new diagnostics ordered. He denies palpitations, dizziness, chest pain, or shortness of breath.  He has not had to use Lasix  lately at all. He has HCTZ 12.5, which he only takes if his blood pressure goes  above 150.  PMP AWARE reviewed today: most recent rx for alprazolam  was filled 06/16/2024, # 90, rx by me. No red flags.  Past Medical History:  Diagnosis Date   Anemia, chronic disease    Anxiety    Aortic atherosclerosis    Atrial fibrillation (HCC)    Bladder cancer (HCC)    Bronchiectasis (HCC)    Chronic systolic heart failure (HCC)    Colon polyps    COPD (chronic obstructive pulmonary disease) (HCC)    Diabetes mellitus with complication (HCC)    Diabetic peripheral neuropathy (HCC) 12/27/2020   Diverticulosis    Essential hypertension    GERD (gastroesophageal reflux disease)    History of CVA with residual deficit    R foot drop   Nephrolithiasis    OSA (obstructive sleep apnea)    Osteoarthritis    Pulmonary nodule    surveillance by Dr. 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 Vernice who felt like EF was 55 to 60% and no impairment of LV function, nl RV fxn, Mod tricusp regurg.  04/2024 EF 50-55%, global LV hypokinesis,mod pulm HTN   TRANSURETHRAL RESECTION OF BLADDER TUMOR  1996   VIDEO BRONCHOSCOPY N/A 06/19/2023   Procedure: VIDEO BRONCHOSCOPY WITHOUT FLUORO;  Surgeon: Gretta Leita SQUIBB, DO;  Location: [facility];  Service: Cardiopulmonary;  Laterality: N/A;    Outpatient Medications Prior to Visit  Medication Sig Dispense Refill   Acetaminophen  (TYLENOL ) 325 MG CAPS Take 1-2 capsules by mouth every 6 (six) hours as needed (pain).      ALPRAZolam  (XANAX ) 0.25 MG tablet Take 1 tablet (0.25 mg total) by mouth 2 (two) times daily as needed for anxiety or sleep. 90 tablet 2   apixaban  (ELIQUIS ) 5 MG TABS tablet Take 1 tablet (5 mg total) by mouth 2 (two) times daily. 180 tablet 1   budesonide -glycopyrrolate -formoterol  (BREZTRI  AEROSPHERE) 160-9-4.8 MCG/ACT AERO inhaler Inhale 2 puffs into the lungs 2 (two) times daily. 10.7 g 11   busPIRone  (BUSPAR ) 10 MG tablet Take 1 tablet (10 mg total) by mouth 2 (two) times daily. 180 tablet 1   Cyanocobalamin  (VITAMIN B12 PO) Take 1 tablet by mouth daily.     esomeprazole  (NEXIUM ) 20 MG capsule Take 20 mg by mouth daily at 12 noon.     feeding supplement (ENSURE ENLIVE / ENSURE PLUS) LIQD Take 237 mLs by mouth 2 (two) times daily between meals.     guaiFENesin  (MUCINEX ) 600 MG 12 hr tablet Take 1 tablet (600 mg total) by mouth 2 (two) times daily. 60 tablet 5   hydrochlorothiazide  (HYDRODIURIL ) 12.5 MG tablet Take 1 tablet (12.5 mg total) by mouth as needed. (Patient taking differently: Take 12.5 mg by mouth as needed (BP > 148 or 150).) 30 tablet 3   levalbuterol  (XOPENEX  HFA) 45 MCG/ACT inhaler Inhale 2 puffs into the lungs every 6 (six) hours as needed for wheezing. 15 g 3   levalbuterol  (XOPENEX ) 0.63 MG/3ML nebulizer solution Take 3 mLs (0.63 mg total) by nebulization every 6 (six) hours as needed for wheezing or shortness of breath. 270 mL 5   metFORMIN  (GLUCOPHAGE -XR) 500 MG 24 hr tablet Take 1 tablet (500 mg total) by mouth daily. (Patient taking differently: Take 500 mg by mouth See admin instructions. Take 1 tablet by mouth every 2 - 3 days.) 90 tablet 3   metoprolol  succinate (TOPROL -XL) 25 MG 24 hr tablet Take 1 tablet (25 mg total) by mouth 2 (two) times daily. 180 tablet 2   omeprazole  (PRILOSEC) 20 MG capsule Take 1 capsule (20 mg total) by mouth daily. 90 capsule 0   ondansetron  (ZOFRAN ) 4 MG tablet Take 1 tablet (4 mg total) by mouth 3 (three) times daily as needed for  nausea/vomiting. 30 tablet 1   triamcinolone  (NASACORT ) 55 MCG/ACT AERO nasal inhaler Place 1 spray into the nose daily. (Patient taking differently: Place 1 spray into the nose 2 (two) times daily.) 16.9 mL 5   furosemide  (LASIX ) 20 MG tablet Take 1 tablet (20 mg total) by mouth daily. (Patient not taking: Reported on 06/09/2024) 60 tablet 0   iron  polysaccharides (NIFEREX) 150 MG capsule Take 1 capsule (150 mg total) by mouth daily. 90 capsule 0   No facility-administered medications prior to visit.    Allergies[1]  Review of Systems As per HPI  PE:    07/08/2024    9:45 AM 07/08/2024  9:37 AM 07/07/2024   11:38 AM  Vitals with BMI  Height  5' 11 5' 11  Weight  144 lbs 10 oz 144 lbs 3 oz  BMI  20.18 20.12  Systolic 139 146 877  Diastolic 70 82 62  Pulse  81 78  02 sat 98% on RA today  Physical Exam  Gen: Alert, well appearing.  Patient is oriented to person, place, time, and situation. AFFECT: pleasant, lucid thought and speech. CV: irreg irreg, rate about 75, soft systolic murmur LUNGS: entire right posterior lung field with insp crackles, most prominent at base. Left lung clear.  Breathing is nonlabored.  Aeration is good. Extremities 1-2+ bilateral lower extremity pitting edema above the sock line.  LABS:  Last CBC Lab Results  Component Value Date   WBC 14.8 (H) 06/09/2024   HGB 11.6 (L) 06/09/2024   HCT 36.5 (L) 06/09/2024   MCV 86.2 06/09/2024   MCH 28.1 05/17/2024   RDW 15.1 06/09/2024   PLT 427.0 (H) 06/09/2024   Last metabolic panel Lab Results  Component Value Date   GLUCOSE 99 05/23/2024   NA 140 05/23/2024   K 4.7 05/23/2024   CL 98 05/23/2024   CO2 33 (H) 05/23/2024   BUN 19 05/23/2024   CREATININE 0.78 05/23/2024   GFR 84.47 05/23/2024   CALCIUM  9.3 05/23/2024   PHOS 2.8 05/09/2024   PROT 6.5 05/14/2024   ALBUMIN 2.1 (L) 05/14/2024   LABGLOB 3.3 05/09/2023   LABGLOB 2.8 05/09/2023   BILITOT 0.4 05/14/2024   ALKPHOS 88 05/14/2024    AST 16 05/14/2024   ALT 21 05/14/2024   ANIONGAP 12 05/17/2024   Last lipids Lab Results  Component Value Date   CHOL 118 09/10/2023   HDL 31.40 (L) 09/10/2023   LDLCALC 69 09/10/2023   TRIG 89.0 09/10/2023   CHOLHDL 4 09/10/2023   Last hemoglobin A1c Lab Results  Component Value Date   HGBA1C 6.0 (A) 07/08/2024   HGBA1C 6.0 07/08/2024   HGBA1C 6.0 07/08/2024   HGBA1C 6.0 07/08/2024   IMPRESSION AND PLAN:  1 debilitated patient. Getting much much better, great p.o. intake lately. Getting rehab at his ALF.   #2 chronic hypoxic respiratory failure. He actually has no hypoxia at rest anymore.  He has O2 to use at home as needed. He is followed by pulmonology, Dr. Jude. He will continue Breztri  daily and Xopenex  as needed.   #3 heart failure with preserved ejection fraction. Appears euvolemic today. Continue Toprol -XL 25 mg twice daily.  He has Lasix  for as needed use.   4.  Hypertension. He is no longer requiring daily dose of HCTZ. He will continue Toprol -XL 25 mg twice daily.   #5 diabetes with complication. Good control.  Hemoglobin A1c today is 6.0% He takes metformin  XR 500 mg, 1 tab every 2 to 3 days typically. Renal function and electrolytes were normal about 6 weeks ago.  #6 BPH with nocturia. Start Flomax 0.4 mg nightly.  #7 leukocytosis and thrombocytosis--> onset approximately 10 weeks ago.  Unknown etiology. Initially detected when he started getting ill after getting his teeth pulled in early October. White blood cell count improved from 16.1 to 14.8 at last check 1 month ago. His platelet count also significantly improved from 729,000 TO 427,000 at last check 1 month ago. Monitor CBC with differential today. Ref hem/onc if leukocytosis and thrombocytosis not continuing to trend down.  An After Visit Summary was printed and given to the patient.  FOLLOW UP:  Return in about 4 weeks (around 08/05/2024) for f/u bph/nocturia.  Signed:  Gerlene Hockey, MD            07/08/2024       [1]  Allergies Allergen Reactions   Simvastatin Rash   Cortisone Nausea And Vomiting   Albuterol  Palpitations and Other (See Comments)    Pt is okay to take xopenex , albuterol  raises his heart rate   Augmentin [Amoxicillin-Pot Clavulanate] Rash   Ciprofloxacin Rash   Contrast Media [Iodinated Contrast Media] Rash   Flagyl  [Metronidazole ] Rash   Latex Hives and Rash   Sulfa  Antibiotics Rash

## 2024-07-09 ENCOUNTER — Encounter: Payer: Self-pay | Admitting: Family Medicine

## 2024-07-14 ENCOUNTER — Other Ambulatory Visit (HOSPITAL_COMMUNITY): Payer: Self-pay

## 2024-07-14 ENCOUNTER — Other Ambulatory Visit: Payer: Self-pay

## 2024-07-17 ENCOUNTER — Encounter: Payer: Self-pay | Admitting: Family Medicine

## 2024-07-17 ENCOUNTER — Other Ambulatory Visit (HOSPITAL_COMMUNITY): Payer: Self-pay

## 2024-07-17 MED ORDER — SOD FLUORIDE-POTASSIUM NITRATE 1.1-5 % DT GEL
DENTAL | 99 refills | Status: AC
  Filled 2024-07-17: qty 100, 30d supply, fill #0

## 2024-07-18 ENCOUNTER — Other Ambulatory Visit: Payer: Self-pay

## 2024-07-18 ENCOUNTER — Other Ambulatory Visit (HOSPITAL_BASED_OUTPATIENT_CLINIC_OR_DEPARTMENT_OTHER): Payer: Self-pay

## 2024-07-18 ENCOUNTER — Other Ambulatory Visit (HOSPITAL_COMMUNITY): Payer: Self-pay

## 2024-07-18 MED ORDER — ONE-A-DAY MENS PO TABS
1.0000 | ORAL_TABLET | Freq: Every day | ORAL | 3 refills | Status: AC
  Filled 2024-07-18 (×2): qty 100, 100d supply, fill #0
  Filled 2024-07-22: qty 30, 30d supply, fill #0

## 2024-07-18 NOTE — Telephone Encounter (Signed)
 Okay, multivitamin prescription sent

## 2024-07-22 ENCOUNTER — Other Ambulatory Visit (HOSPITAL_COMMUNITY): Payer: Self-pay

## 2024-07-22 ENCOUNTER — Other Ambulatory Visit (HOSPITAL_BASED_OUTPATIENT_CLINIC_OR_DEPARTMENT_OTHER): Payer: Self-pay

## 2024-07-22 ENCOUNTER — Other Ambulatory Visit: Payer: Self-pay

## 2024-07-22 MED FILL — Metformin HCl Tab ER 24HR 500 MG: ORAL | 30 days supply | Qty: 30 | Fill #5 | Status: AC

## 2024-07-23 ENCOUNTER — Other Ambulatory Visit (HOSPITAL_COMMUNITY): Payer: Self-pay

## 2024-07-28 ENCOUNTER — Ambulatory Visit: Payer: Self-pay

## 2024-07-28 NOTE — Telephone Encounter (Signed)
 Pt scheduled w/ alternate provider.

## 2024-07-28 NOTE — Telephone Encounter (Signed)
 COPD flare, needs to be evaluated  FYI Only or Action Required?: FYI only for provider: appointment scheduled on 1/20.  Patient was last seen in primary care on 07/08/2024 by [name], [name], MD.  Called Nurse Triage reporting Poor appetite, Fatigue, and Shortness of Breath.  Symptoms began several days ago.  Interventions attempted: Prescription medications: xopenex , wearing O2.  Symptoms are: gradually improving.  Triage Disposition: See HCP Within 4 Hours (Or PCP Triage)  Patient/caregiver understands and will follow disposition?: Yes   Reason for Triage: Patient's daughter Rosina notice pt is having SOB, fatigue, no appetite, been worse last 2 says has been on his on his oxygen  during the day time usually is on it at night   Reason for Disposition  [1] Longstanding difficulty breathing (e.g., CHF, COPD, emphysema) AND [2] WORSE than normal  Answer Assessment - Initial Assessment Questions 1. RESPIRATORY STATUS: Describe your breathing? (e.g., wheezing, shortness of breath, unable to speak, severe coughing)      Increased shortness of breath, wearing O2 at day time even though he used to only wear it at night  2. ONSET: When did this breathing problem begin?      2 days  3. PATTERN Does the difficult breathing come and go, or has it been constant since it started?      Comes and goes  4. SEVERITY: How bad is your breathing? (e.g., mild, moderate, severe)      It's better now per patient  5. RECURRENT SYMPTOM: Have you had difficulty breathing before? If Yes, ask: When was the last time? and What happened that time?      Yes, recently  7. LUNG HISTORY: Do you have any history of lung disease?  (e.g., pulmonary embolus, asthma, emphysema)     COPD  8. CAUSE: What do you think is causing the breathing problem?      COPD  9. OTHER SYMPTOMS: Do you have any other symptoms? (e.g., chest pain, cough, dizziness, fever, runny nose)     Denies  10. O2  SATURATION MONITOR:  Do you use an oxygen  saturation monitor (pulse oximeter) at home? If Yes, ask: What is your reading (oxygen  level) today? What is your usual oxygen  saturation reading? (e.g., 95%)       94% per daughter of pt  Pt lives in independent living facility.  Protocols used: Breathing Difficulty-A-AH

## 2024-07-29 ENCOUNTER — Encounter: Payer: Self-pay | Admitting: Family Medicine

## 2024-07-29 ENCOUNTER — Other Ambulatory Visit (HOSPITAL_BASED_OUTPATIENT_CLINIC_OR_DEPARTMENT_OTHER): Payer: Self-pay

## 2024-07-29 ENCOUNTER — Other Ambulatory Visit (HOSPITAL_COMMUNITY): Payer: Self-pay

## 2024-07-29 ENCOUNTER — Ambulatory Visit: Admitting: Family Medicine

## 2024-07-29 VITALS — BP 118/60 | HR 78 | Temp 97.9°F | Ht 71.0 in | Wt 144.6 lb

## 2024-07-29 DIAGNOSIS — J441 Chronic obstructive pulmonary disease with (acute) exacerbation: Secondary | ICD-10-CM | POA: Diagnosis not present

## 2024-07-29 MED ORDER — DOXYCYCLINE HYCLATE 100 MG PO TABS
100.0000 mg | ORAL_TABLET | Freq: Two times a day (BID) | ORAL | 0 refills | Status: AC
  Filled 2024-07-29: qty 20, 10d supply, fill #0

## 2024-07-29 MED ORDER — PREDNISONE 20 MG PO TABS
20.0000 mg | ORAL_TABLET | Freq: Two times a day (BID) | ORAL | 0 refills | Status: AC
  Filled 2024-07-29: qty 14, 7d supply, fill #0

## 2024-07-29 NOTE — Progress Notes (Signed)
 "  Established Patient Office Visit   Subjective:  Patient ID: Matthew Hall, male    DOB: Jun 05, 1946  Age: 79 y.o. MRN: [id number]  Chief Complaint  Patient presents with   Acute Visit    Increased shortness of breath with COPD     HPI Encounter Diagnoses  Name Primary?   COPD with acute exacerbation (HCC) Yes   1 day history of postnasal drip with chest congestion.  Cough has been productive of clear to pale yellow phlegm.  No fevers or chills.  There has been some tightness in the chest.  Nebulized Xopenex  did help.  History of COPD.  History of leukocytosis and thrombocytosis.  History of atrial fibrillation.     Review of Systems  Constitutional: Negative.   HENT:  Positive for congestion.   Eyes:  Negative for blurred vision, discharge and redness.  Respiratory:  Positive for cough, sputum production and wheezing. Negative for shortness of breath.   Cardiovascular: Negative.   Gastrointestinal:  Negative for abdominal pain.  Genitourinary: Negative.   Musculoskeletal: Negative.  Negative for myalgias.  Skin:  Negative for rash.  Neurological:  Negative for tingling, loss of consciousness and weakness.  Endo/Heme/Allergies:  Negative for polydipsia.    Current Medications[1]   Objective:     BP 118/60   Pulse 78   Temp 97.9 F (36.6 C)   Ht 5' 11 (1.803 m)   Wt 144 lb 9.6 oz (65.6 kg)   SpO2 95%   BMI 20.17 kg/m    Physical Exam Constitutional:      General: He is not in acute distress.    Appearance: Normal appearance. He is not ill-appearing, toxic-appearing or diaphoretic.  HENT:     Head: Normocephalic and atraumatic.     Right Ear: External ear normal.     Left Ear: External ear normal.     Mouth/Throat:     Mouth: Mucous membranes are moist.     Pharynx: Oropharynx is clear. No oropharyngeal exudate or posterior oropharyngeal erythema.  Eyes:     General: No scleral icterus.       Right eye: No discharge.        Left eye: No discharge.      Extraocular Movements: Extraocular movements intact.     Conjunctiva/sclera: Conjunctivae normal.  Cardiovascular:     Rate and Rhythm: Normal rate and regular rhythm.  Pulmonary:     Effort: Pulmonary effort is normal. No respiratory distress.     Breath sounds: Decreased air movement present. Decreased breath sounds and wheezing present.  Musculoskeletal:     Cervical back: No rigidity or tenderness.  Lymphadenopathy:     Cervical: No cervical adenopathy.  Skin:    General: Skin is warm and dry.  Neurological:     Mental Status: He is alert and oriented to person, place, and time.  Psychiatric:        Mood and Affect: Mood normal.        Behavior: Behavior normal.      No results found for any visits on 07/29/24.    The ASCVD Risk score (Arnett DK, et al., 2019) failed to calculate for the following reasons:   The 2019 ASCVD risk score is only valid for ages 84 to 80   Risk score cannot be calculated because patient has a medical history suggesting prior/existing ASCVD   * - Cholesterol units were assumed    Assessment & Plan:   COPD with acute exacerbation (HCC) -  Doxycycline  Hyclate; Take 1 tablet (100 mg total) by mouth 2 (two) times daily for 10 days.  Dispense: 20 tablet; Refill: 0 -     predniSONE ; Take 1 tablet (20 mg total) by mouth 2 (two) times daily with a meal for 7 days.  Dispense: 14 tablet; Refill: 0    Return Has follow-up scheduled with provider on the 27th.  Continue Xopenex  and Mucinex .  Elsie Sim Lent, MD    [1]  Current Outpatient Medications:    Acetaminophen  (TYLENOL ) 325 MG CAPS, Take 1-2 capsules by mouth every 6 (six) hours as needed (pain)., Disp: , Rfl:    ALPRAZolam  (XANAX ) 0.25 MG tablet, Take 1 tablet (0.25 mg total) by mouth 2 (two) times daily as needed for anxiety or sleep., Disp: 90 tablet, Rfl: 2   apixaban  (ELIQUIS ) 5 MG TABS tablet, Take 1 tablet (5 mg total) by mouth 2 (two) times daily., Disp: 180 tablet, Rfl:  1   budesonide -glycopyrrolate -formoterol  (BREZTRI  AEROSPHERE) 160-9-4.8 MCG/ACT AERO inhaler, Inhale 2 puffs into the lungs 2 (two) times daily., Disp: 10.7 g, Rfl: 11   busPIRone  (BUSPAR ) 10 MG tablet, Take 1 tablet (10 mg total) by mouth 2 (two) times daily., Disp: 180 tablet, Rfl: 1   Cyanocobalamin  (VITAMIN B12 PO), Take 1 tablet by mouth daily., Disp: , Rfl:    doxycycline  (VIBRA -TABS) 100 MG tablet, Take 1 tablet (100 mg total) by mouth 2 (two) times daily for 10 days., Disp: 20 tablet, Rfl: 0   esomeprazole  (NEXIUM ) 20 MG capsule, Take 20 mg by mouth daily at 12 noon., Disp: , Rfl:    feeding supplement (ENSURE ENLIVE / ENSURE PLUS) LIQD, Take 237 mLs by mouth 2 (two) times daily between meals., Disp: , Rfl:    furosemide  (LASIX ) 20 MG tablet, Take 1 tablet (20 mg total) by mouth as needed for edema (leg swelling)., Disp: 90 tablet, Rfl: 0   guaiFENesin  (MUCINEX ) 600 MG 12 hr tablet, Take 1 tablet (600 mg total) by mouth 2 (two) times daily., Disp: 60 tablet, Rfl: 5   hydrochlorothiazide  (HYDRODIURIL ) 12.5 MG tablet, Take 1 tablet (12.5 mg total) by mouth as needed. (Patient taking differently: Take 12.5 mg by mouth as needed (BP > 148 or 150).), Disp: 30 tablet, Rfl: 3   levalbuterol  (XOPENEX  HFA) 45 MCG/ACT inhaler, Inhale 2 puffs into the lungs every 6 (six) hours as needed for wheezing., Disp: 15 g, Rfl: 3   levalbuterol  (XOPENEX ) 0.63 MG/3ML nebulizer solution, Take 3 mLs (0.63 mg total) by nebulization every 6 (six) hours as needed for wheezing or shortness of breath., Disp: 270 mL, Rfl: 5   metoprolol  succinate (TOPROL -XL) 25 MG 24 hr tablet, Take 1 tablet (25 mg total) by mouth 2 (two) times daily., Disp: 180 tablet, Rfl: 2   multivitamin (ONE-A-DAY MEN'S) TABS tablet, Take 1 tablet by mouth daily., Disp: 100 tablet, Rfl: 3   omeprazole  (PRILOSEC) 20 MG capsule, Take 1 capsule (20 mg total) by mouth daily., Disp: 90 capsule, Rfl: 0   ondansetron  (ZOFRAN ) 4 MG tablet, Take 1 tablet (4 mg  total) by mouth 3 (three) times daily as needed for nausea/vomiting., Disp: 30 tablet, Rfl: 1   predniSONE  (DELTASONE ) 20 MG tablet, Take 1 tablet (20 mg total) by mouth 2 (two) times daily with a meal for 7 days., Disp: 14 tablet, Rfl: 0   Sod Fluoride -Potassium Nitrate  (PREVIDENT 5000 SENSITIVE) 1.1-5 % GEL, Use as directed to brush teeth., Disp: 100 mL, Rfl: 99   tamsulosin  (FLOMAX ) 0.4  MG CAPS capsule, Take 1 capsule (0.4 mg total) by mouth daily., Disp: 30 capsule, Rfl: 1   triamcinolone  (NASACORT ) 55 MCG/ACT AERO nasal inhaler, Place 1 spray into the nose daily. (Patient taking differently: Place 1 spray into the nose 2 (two) times daily.), Disp: 16.9 mL, Rfl: 5   metFORMIN  (GLUCOPHAGE -XR) 500 MG 24 hr tablet, Take 1 tablet (500 mg total) by mouth daily. (Patient taking differently: Take 500 mg by mouth See admin instructions. Take 1 tablet by mouth every 2 - 3 days.), Disp: 90 tablet, Rfl: 3  "

## 2024-07-30 ENCOUNTER — Encounter: Payer: Self-pay | Admitting: Family Medicine

## 2024-08-01 ENCOUNTER — Inpatient Hospital Stay: Attending: Hematology & Oncology

## 2024-08-01 ENCOUNTER — Inpatient Hospital Stay: Admitting: Family

## 2024-08-01 ENCOUNTER — Encounter: Payer: Self-pay | Admitting: Family

## 2024-08-01 ENCOUNTER — Other Ambulatory Visit: Payer: Self-pay | Admitting: Family

## 2024-08-01 VITALS — BP 140/62 | HR 92 | Temp 97.8°F | Resp 19 | Ht 71.0 in | Wt 141.0 lb

## 2024-08-01 DIAGNOSIS — D72829 Elevated white blood cell count, unspecified: Secondary | ICD-10-CM

## 2024-08-01 DIAGNOSIS — D75839 Thrombocytosis, unspecified: Secondary | ICD-10-CM | POA: Diagnosis not present

## 2024-08-01 DIAGNOSIS — D649 Anemia, unspecified: Secondary | ICD-10-CM

## 2024-08-01 DIAGNOSIS — D509 Iron deficiency anemia, unspecified: Secondary | ICD-10-CM

## 2024-08-01 LAB — CBC WITH DIFFERENTIAL (CANCER CENTER ONLY)
Abs Immature Granulocytes: 0.16 K/uL — ABNORMAL HIGH (ref 0.00–0.07)
Basophils Absolute: 0 K/uL (ref 0.0–0.1)
Basophils Relative: 0 %
Eosinophils Absolute: 0 K/uL (ref 0.0–0.5)
Eosinophils Relative: 0 %
HCT: 35.3 % — ABNORMAL LOW (ref 39.0–52.0)
Hemoglobin: 10.8 g/dL — ABNORMAL LOW (ref 13.0–17.0)
Immature Granulocytes: 1 %
Lymphocytes Relative: 8 %
Lymphs Abs: 1.3 K/uL (ref 0.7–4.0)
MCH: 25.4 pg — ABNORMAL LOW (ref 26.0–34.0)
MCHC: 30.6 g/dL (ref 30.0–36.0)
MCV: 83.1 fL (ref 80.0–100.0)
Monocytes Absolute: 1.6 K/uL — ABNORMAL HIGH (ref 0.1–1.0)
Monocytes Relative: 10 %
Neutro Abs: 12.4 K/uL — ABNORMAL HIGH (ref 1.7–7.7)
Neutrophils Relative %: 81 %
Platelet Count: 732 K/uL — ABNORMAL HIGH (ref 150–400)
RBC: 4.25 MIL/uL (ref 4.22–5.81)
RDW: 15.4 % (ref 11.5–15.5)
WBC Count: 15.4 K/uL — ABNORMAL HIGH (ref 4.0–10.5)
nRBC: 0 % (ref 0.0–0.2)

## 2024-08-01 LAB — IRON AND IRON BINDING CAPACITY (CC-WL,HP ONLY)
Iron: 16 ug/dL — ABNORMAL LOW (ref 45–182)
Saturation Ratios: 4 % — ABNORMAL LOW (ref 17.9–39.5)
TIBC: 374 ug/dL (ref 250–450)
UIBC: 358 ug/dL

## 2024-08-01 LAB — RETICULOCYTES
Immature Retic Fract: 20.7 % — ABNORMAL HIGH (ref 2.3–15.9)
RBC.: 4.21 MIL/uL — ABNORMAL LOW (ref 4.22–5.81)
Retic Count, Absolute: 74.5 K/uL (ref 19.0–186.0)
Retic Ct Pct: 1.8 % (ref 0.4–3.1)

## 2024-08-01 LAB — CMP (CANCER CENTER ONLY)
ALT: 15 U/L (ref 0–44)
AST: 14 U/L — ABNORMAL LOW (ref 15–41)
Albumin: 3.5 g/dL (ref 3.5–5.0)
Alkaline Phosphatase: 94 U/L (ref 38–126)
Anion gap: 10 (ref 5–15)
BUN: 21 mg/dL (ref 8–23)
CO2: 29 mmol/L (ref 22–32)
Calcium: 9.6 mg/dL (ref 8.9–10.3)
Chloride: 101 mmol/L (ref 98–111)
Creatinine: 0.74 mg/dL (ref 0.61–1.24)
GFR, Estimated: >60 mL/min
Glucose, Bld: 139 mg/dL — ABNORMAL HIGH (ref 70–99)
Potassium: 4 mmol/L (ref 3.5–5.1)
Sodium: 140 mmol/L (ref 135–145)
Total Bilirubin: 0.3 mg/dL (ref 0.0–1.2)
Total Protein: 7.8 g/dL (ref 6.5–8.1)

## 2024-08-01 LAB — TECHNOLOGIST SMEAR REVIEW: Plt Morphology: NORMAL

## 2024-08-01 LAB — FERRITIN: Ferritin: 90 ng/mL (ref 24–336)

## 2024-08-01 LAB — VITAMIN B12: Vitamin B-12: 1354 pg/mL — ABNORMAL HIGH (ref 180–914)

## 2024-08-01 LAB — LACTATE DEHYDROGENASE: LDH: 172 U/L (ref 105–235)

## 2024-08-01 LAB — FOLATE: Folate: 12 ng/mL

## 2024-08-01 NOTE — Progress Notes (Unsigned)
 Hematology/Oncology Consultation   Name: Matthew Hall      MRN: [id number]    Location: Room/bed info not found  Date: 08/01/2024 Time:4:29 PM   REFERRING PHYSICIAN:  Aleene Hockey, MD  REASON FOR CONSULT:  Leukocytosis and Thrombocytosis    DIAGNOSIS: Leukocytosis, thrombocytosis and anemia   HISTORY OF PRESENT ILLNESS: Matthew Hall a very pleasant 79 yo caucasian gentleman with leukocytosis noted as far back as 2010. Mild anemia first noted in 06/2023 associated with iron  deficiency and has persisted since that times. Thrombocytosis noted off and on since 08/2023.  He was hospitalized in October 2025 with acute exacerbation of HF with aspiration pneumonia. No recent recurrent or persistent infection noted.  No fever, chills, rash, dizziness, chest pain, palpitations, abdominal pain or changes in bowel or bladder habits.  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  He states that he sustained a crushed vertebrae many years ago while stationed in [place] with the [employer]  He states that he quit smoking years ago but has history of COPD with mild SOB with exertion. He describes this as stable.  He states that his appetite has decreased significantly. His weight is down from 160 lbs in 06/2023 to 141 lbs today for a total of 19 lbs. He has not been hydrating well at home per his daughter.  He has occasional nausea without vomiting.  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 He had history of atrial fib. MRI showed punctate infarcts in the right temporal lobe, right basal ganglia, and left frontal white matter. He was treated with Warfarin and then transitioned to Eliquis  which he is still on.  He denies any obvious blood loss. No abnormal bruising, no petechiae.  He has minimal residual right sided weakness and minimal foot drop. He has been through PT/OT and done well since then.  He has history of neuropathy in his feet and ambulates with a Rolator for added support.   No falls or syncope reported.  He has type II diabetes and is currently on Metformin . Hgb A1c last month was 6.0.  No history of thyroid  disease.  He states he has history of prostate cancer treated with partial prostatectomy while he lived in [place].  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 He has had Mohs surgery to the right side of his face for basal cell ca and now needs this to remove another lesion on his left cheek with Dr. JINNY Hall.  This procedure is currently on hold for teeth extraction first per patient. His brother had history of metastatic liver cancer.  Patient worked many years for [employer] and was exposed to quite a few chemicals during his time there.   ROS: All other 10 point review of systems is negative.   PAST MEDICAL HISTORY:   Past Medical History:  Diagnosis Date   Anemia, chronic disease    Anxiety    Aortic atherosclerosis    Atrial fibrillation (HCC)    Bladder cancer (HCC)    Bronchiectasis (HCC)    Chronic systolic heart failure (HCC)    Colon polyps    COPD (chronic obstructive pulmonary disease) (HCC)    Diabetes mellitus with complication (HCC)    Diabetic peripheral neuropathy (HCC) 12/27/2020   Diverticulosis    Essential hypertension    GERD (gastroesophageal reflux disease)    History of CVA with residual deficit    R  foot drop   Nephrolithiasis    OSA (obstructive sleep apnea)    Osteoarthritis    Pulmonary nodule    surveillance by Dr. 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 ?hx of mononeuritis multiplex    ALLERGIES: Allergies[1]    MEDICATIONS:  Medications Ordered Prior to Encounter[2]   PAST SURGICAL HISTORY Past Surgical History:  Procedure Laterality Date   BRONCHIAL WASHINGS  06/19/2023   Procedure: BRONCHIAL WASHINGS;  Surgeon: Matthew Leita SQUIBB,  DO;  Location: [facility];  Service: Cardiopulmonary;;   COLONOSCOPY     Kidney stones removed  1996   SKIN CANCER EXCISION Left    2003   TRANSTHORACIC ECHOCARDIOGRAM     04/2023 EF 40-45 on echo transcription but reviewed by Dr. Vernice who felt like EF was 55 to 60% and no impairment of LV function, nl RV fxn, Mod tricusp regurg.  04/2024 EF 50-55%, global LV hypokinesis,mod pulm HTN   TRANSURETHRAL RESECTION OF BLADDER TUMOR  1996   VIDEO BRONCHOSCOPY N/A 06/19/2023   Procedure: VIDEO BRONCHOSCOPY WITHOUT FLUORO;  Surgeon: Matthew Leita SQUIBB, DO;  Location: [facility];  Service: Cardiopulmonary;  Laterality: N/A;    FAMILY HISTORY: Family History  Problem Relation Age of Onset   Diabetes Mother    CAD Mother    Emphysema Father    Diabetes Sister    Stroke Sister    Lung cancer Brother    Liver cancer Brother    Colon cancer Neg Hx     SOCIAL HISTORY:  reports that he quit smoking about 13 years ago. His smoking use included cigarettes. He started smoking about 43 years ago. He has a 30 pack-year smoking history. He has never used smokeless tobacco. He reports that he does not drink alcohol  and does not use drugs.  PERFORMANCE STATUS: The patient's performance status is 1 - Symptomatic but completely ambulatory  PHYSICAL EXAM: Most Recent Vital Signs: Blood pressure (!) 140/62, pulse 92, temperature 97.8 F (36.6 C), temperature source Oral, resp. rate 19, height 5' 11 (1.803 m), weight 141 lb (64 kg), SpO2 95%. BP (!) 140/62 (BP Location: Right Arm, Patient Position: Sitting)   Pulse 92   Temp 97.8 F (36.6 C) (Oral)   Resp 19   Ht 5' 11 (1.803 m)   Wt 141 lb (64 kg)   SpO2 95%   BMI 19.67 kg/m   General Appearance:    Alert, cooperative, no distress, appears stated age  Head:    Normocephalic, without obvious abnormality, atraumatic  Eyes:    PERRL, conjunctiva/corneas clear, EOM's intact, fundi    benign, both eyes             Throat:   Lips, mucosa, and  tongue normal; teeth and gums normal  Neck:   Supple, symmetrical, trachea midline, no adenopathy;       thyroid :  No enlargement/tenderness/nodules; no carotid   bruit or JVD  Back:     Symmetric, no curvature, ROM normal, no CVA tenderness  Lungs:     Clear to auscultation bilaterally, respirations unlabored  Chest wall:    No tenderness or deformity  Heart:    Regular rate and rhythm, S1 and S2 normal, no murmur, rub   or gallop  Abdomen:     Soft, non-tender, bowel sounds active all four  quadrants,    no masses, no organomegaly        Extremities:   Extremities normal, atraumatic, no cyanosis or edema  Pulses:   2+ and symmetric all extremities  Skin:   Skin color, texture, turgor normal, no rashes or lesions  Lymph nodes:   Cervical, supraclavicular, and axillary nodes normal  Neurologic:   CNII-XII intact. Normal strength, sensation and reflexes      throughout    LABORATORY DATA:  Results for orders placed or performed in visit on 08/01/24 (from the past 48 hours)  CBC with Differential (Cancer Center Only)     Status: Abnormal   Collection Time: 08/01/24  3:20 PM  Result Value Ref Range   WBC Count 15.4 (H) 4.0 - 10.5 K/uL   RBC 4.25 4.22 - 5.81 MIL/uL   Hemoglobin 10.8 (L) 13.0 - 17.0 g/dL   HCT 64.6 (L) 60.9 - 47.9 %   MCV 83.1 80.0 - 100.0 fL   MCH 25.4 (L) 26.0 - 34.0 pg   MCHC 30.6 30.0 - 36.0 g/dL   RDW 84.5 88.4 - 84.4 %   Platelet Count 732 (H) 150 - 400 K/uL   nRBC 0.0 0.0 - 0.2 %   Neutrophils Relative % 81 %   Neutro Abs 12.4 (H) 1.7 - 7.7 K/uL   Lymphocytes Relative 8 %   Lymphs Abs 1.3 0.7 - 4.0 K/uL   Monocytes Relative 10 %   Monocytes Absolute 1.6 (H) 0.1 - 1.0 K/uL   Eosinophils Relative 0 %   Eosinophils Absolute 0.0 0.0 - 0.5 K/uL   Basophils Relative 0 %   Basophils Absolute 0.0 0.0 - 0.1 K/uL   Immature Granulocytes 1 %   Abs Immature Granulocytes 0.16 (H) 0.00 - 0.07 K/uL    Comment: Performed at [facility], [address] [place], [place] [address]  CMP (Cancer Center only)     Status: Abnormal   Collection Time: 08/01/24  3:20 PM  Result Value Ref Range   Sodium 140 135 - 145 mmol/L   Potassium 4.0 3.5 - 5.1 mmol/L   Chloride 101 98 - 111 mmol/L   CO2 29 22 - 32 mmol/L   Glucose, Bld 139 (H) 70 - 99 mg/dL    Comment: Glucose reference range applies only to samples taken after fasting for at least 8 hours.   BUN 21 8 - 23 mg/dL   Creatinine 9.25 9.38 - 1.24 mg/dL   Calcium  9.6 8.9 - 10.3 mg/dL   Total Protein 7.8 6.5 - 8.1 g/dL   Albumin 3.5 3.5 - 5.0 g/dL   AST 14 (L) 15 - 41 U/L   ALT 15 0 - 44 U/L   Alkaline Phosphatase 94 38 - 126 U/L   Total Bilirubin 0.3 0.0 - 1.2 mg/dL   GFR, Estimated >39 >39 mL/min    Comment: (NOTE) Calculated using the CKD-EPI Creatinine Equation (2021)    Anion gap 10 5 - 15    Comment: Performed at [facility], [address] [place], [place] [address]  Reticulocytes     Status: Abnormal   Collection Time: 08/01/24  3:20 PM  Result Value Ref Range   Retic Ct Pct 1.8 0.4 - 3.1 %   RBC. 4.21 (L) 4.22 - 5.81 MIL/uL   Retic Count, Absolute 74.5 19.0 - 186.0 K/uL   Immature Retic Fract 20.7 (H) 2.3 - 15.9 %    Comment: Performed at [facility]  [address], [address] [place], [place] [address]  Technologist smear review     Status: None   Collection Time: 08/01/24  3:20 PM  Result Value Ref Range   WBC MORPHOLOGY MORPHOLOGY UNREMARKABLE     Comment: Performed at [facility], [address] [place], [place] [address]   RBC MORPHOLOGY MORPHOLOGY UNREMARKABLE     Comment: Performed at [facility], [address] [place], [place] [address]   Plt Morphology Normal platelet morphology     Comment: Performed at [facility], [address] [place], [place] [address]   Clinical Information      leukocytosis and thrombocytosis with anemia. Assessing for myeloproliferative disease.    Comment: Performed at [facility], [address], [place], [place] [address]  Lactate dehydrogenase (LDH)     Status: None   Collection Time: 08/01/24  3:20 PM  Result Value Ref Range   LDH 172 105 - 235 U/L    Comment: Performed at [facility], [address] [place], [place] [address]      RADIOGRAPHY: No results found.     PATHOLOGY: None   ASSESSMENT/PLAN: Mr. Jurgens a very pleasant 79 yo caucasian gentleman with leukocytosis noted as far back as 2010. Mild anemia first noted in 06/2023 associated with iron  deficiency and has persisted since that times. Thrombocytosis noted off and on since 08/2023.  Iron  is low so we will set him up for replacement. This may have caused secondary thrombocytosis.  B 12 reduced to taking once every 3rd day. Folate normal. Epo level is stable at 19.  Myeloproliferative panel pending.  Blood smear reviewed by technologist was unremarkable.  Follow-up in 6 weeks.  All questions were answered. The patient knows to call the clinic with any problems, questions or concerns. We can certainly see the patient much sooner if necessary.  The patient was discussed with Dr. Timmy and he is in agreement with the aforementioned.   Lauraine Pepper, NP           [1]  Allergies Allergen Reactions   Simvastatin Rash   Cortisone Nausea And Vomiting   Albuterol  Palpitations and Other (See Comments)    Pt is okay to take xopenex , albuterol  raises his heart rate   Augmentin [Amoxicillin-Pot Clavulanate] Rash   Ciprofloxacin Rash   Contrast Media [Iodinated Contrast Media] Rash   Flagyl  [Metronidazole ] Rash   Latex Hives and Rash   Sulfa  Antibiotics Rash  [2]  Current Outpatient Medications on File Prior to Visit  Medication Sig Dispense Refill   Acetaminophen  (TYLENOL ) 325 MG CAPS Take 1-2 capsules by mouth every 6 (six) hours as needed (pain).     ALPRAZolam  (XANAX ) 0.25 MG tablet Take 1 tablet (0.25 mg total) by mouth 2 (two)  times daily as needed for anxiety or sleep. 90 tablet 2   apixaban  (ELIQUIS ) 5 MG TABS tablet Take 1 tablet (5 mg total) by mouth 2 (two) times daily. 180 tablet 1   budesonide -glycopyrrolate -formoterol  (BREZTRI  AEROSPHERE) 160-9-4.8 MCG/ACT AERO inhaler Inhale 2 puffs into the lungs 2 (two) times daily. 10.7 g 11   busPIRone  (BUSPAR ) 10 MG tablet Take 1 tablet (10 mg total) by mouth 2 (two) times daily. 180 tablet 1   Cyanocobalamin  (VITAMIN B12 PO) Take 1 tablet by mouth daily.     doxycycline  (VIBRA -TABS) 100 MG tablet Take 1 tablet (100 mg total)  by mouth 2 (two) times daily for 10 days. 20 tablet 0   esomeprazole  (NEXIUM ) 20 MG capsule Take 20 mg by mouth daily at 12 noon.     feeding supplement (ENSURE ENLIVE / ENSURE PLUS) LIQD Take 237 mLs by mouth 2 (two) times daily between meals.     furosemide  (LASIX ) 20 MG tablet Take 1 tablet (20 mg total) by mouth as needed for edema (leg swelling). 90 tablet 0   guaiFENesin  (MUCINEX ) 600 MG 12 hr tablet Take 1 tablet (600 mg total) by mouth 2 (two) times daily. 60 tablet 5   hydrochlorothiazide  (HYDRODIURIL ) 12.5 MG tablet Take 1 tablet (12.5 mg total) by mouth as needed. (Patient taking differently: Take 12.5 mg by mouth as needed (BP > 148 or 150).) 30 tablet 3   levalbuterol  (XOPENEX  HFA) 45 MCG/ACT inhaler Inhale 2 puffs into the lungs every 6 (six) hours as needed for wheezing. 15 g 3   levalbuterol  (XOPENEX ) 0.63 MG/3ML nebulizer solution Take 3 mLs (0.63 mg total) by nebulization every 6 (six) hours as needed for wheezing or shortness of breath. 270 mL 5   metFORMIN  (GLUCOPHAGE -XR) 500 MG 24 hr tablet Take 1 tablet (500 mg total) by mouth daily. (Patient taking differently: Take 500 mg by mouth See admin instructions. Take 1 tablet by mouth every 2 - 3 days.) 90 tablet 3   metoprolol  succinate (TOPROL -XL) 25 MG 24 hr tablet Take 1 tablet (25 mg total) by mouth 2 (two) times daily. 180 tablet 2   multivitamin (ONE-A-DAY MEN'S) TABS tablet Take 1  tablet by mouth daily. 100 tablet 3   omeprazole  (PRILOSEC) 20 MG capsule Take 1 capsule (20 mg total) by mouth daily. 90 capsule 0   ondansetron  (ZOFRAN ) 4 MG tablet Take 1 tablet (4 mg total) by mouth 3 (three) times daily as needed for nausea/vomiting. 30 tablet 1   predniSONE  (DELTASONE ) 20 MG tablet Take 1 tablet (20 mg total) by mouth 2 (two) times daily with a meal for 7 days. 14 tablet 0   Sod Fluoride -Potassium Nitrate  (PREVIDENT 5000 SENSITIVE) 1.1-5 % GEL Use as directed to brush teeth. 100 mL 99   tamsulosin  (FLOMAX ) 0.4 MG CAPS capsule Take 1 capsule (0.4 mg total) by mouth daily. 30 capsule 1   triamcinolone  (NASACORT ) 55 MCG/ACT AERO nasal inhaler Place 1 spray into the nose daily. (Patient taking differently: Place 1 spray into the nose 2 (two) times daily.) 16.9 mL 5   No current facility-administered medications on file prior to visit.

## 2024-08-03 LAB — ERYTHROPOIETIN: Erythropoietin: 19.7 m[IU]/mL — ABNORMAL HIGH (ref 2.6–18.5)

## 2024-08-05 ENCOUNTER — Ambulatory Visit: Admitting: Family Medicine

## 2024-08-05 ENCOUNTER — Encounter: Payer: Self-pay | Admitting: Family

## 2024-08-05 ENCOUNTER — Other Ambulatory Visit: Payer: Self-pay | Admitting: Family Medicine

## 2024-08-05 ENCOUNTER — Encounter: Payer: Self-pay | Admitting: Family Medicine

## 2024-08-06 LAB — MPN/CML FISH

## 2024-08-08 ENCOUNTER — Other Ambulatory Visit: Payer: Self-pay | Admitting: Family

## 2024-08-08 DIAGNOSIS — D509 Iron deficiency anemia, unspecified: Secondary | ICD-10-CM | POA: Insufficient documentation

## 2024-08-11 ENCOUNTER — Ambulatory Visit: Admitting: Family Medicine

## 2024-08-12 ENCOUNTER — Encounter: Payer: Self-pay | Admitting: Family

## 2024-08-14 ENCOUNTER — Inpatient Hospital Stay

## 2024-08-14 VITALS — BP 109/58 | HR 59 | Temp 98.0°F | Resp 18 | Wt 144.0 lb

## 2024-08-14 DIAGNOSIS — D509 Iron deficiency anemia, unspecified: Secondary | ICD-10-CM

## 2024-08-14 MED ORDER — SODIUM CHLORIDE 0.9 % IV SOLN: INTRAVENOUS | Status: DC

## 2024-08-14 MED ORDER — SODIUM CHLORIDE 0.9 % IV SOLN
510.0000 mg | Freq: Once | INTRAVENOUS | Status: AC
  Administered 2024-08-14: 510 mg via INTRAVENOUS
  Filled 2024-08-14: qty 17

## 2024-08-14 NOTE — Patient Instructions (Signed)
 Ferumoxytol  Injection What is this medication? FERUMOXYTOL  (FER ue MOX i tol) treats low levels of iron  in your body (iron  deficiency anemia). Iron  is a mineral that plays an important role in making red blood cells, which carry oxygen from your lungs to the rest of your body. This medicine may be used for other purposes; ask your health care provider or pharmacist if you have questions. COMMON BRAND NAME(S): Feraheme  What should I tell my care team before I take this medication? They need to know if you have any of these conditions: Anemia not caused by low iron  levels High levels of iron  in the blood Magnetic resonance imaging (MRI) test scheduled An unusual or allergic reaction to iron , other medications, foods, dyes, or preservatives Pregnant or trying to get pregnant Breastfeeding How should I use this medication? This medication is injected into a vein. It is given by your care team in a hospital or clinic setting. Talk to your care team the use of this medication in children. Special care may be needed. Overdosage: If you think you have taken too much of this medicine contact a poison control center or emergency room at once. NOTE: This medicine is only for you. Do not share this medicine with others. What if I miss a dose? It is important not to miss your dose. Call your care team if you are unable to keep an appointment. What may interact with this medication? Other iron  products This list may not describe all possible interactions. Give your health care provider a list of all the medicines, herbs, non-prescription drugs, or dietary supplements you use. Also tell them if you smoke, drink alcohol , or use illegal drugs. Some items may interact with your medicine. What should I watch for while using this medication? Your condition will be monitored carefully while you are receiving this medication. Tell your care team if your symptoms do not start to get better or if they get  worse. You may need blood work done while you are taking this medication. Sometimes, when medications are infused into veins, a little can leak out of the vein and into the tissue around it. If this medication leaks, it can cause a brown or dark stain on the skin. This is not common. It may be permanent. If you feel pain or swelling during your infusion, tell your care team right away. They can stop the infusion and treat the area. You may need to eat more foods that contain iron . Talk to your care team. Foods that contain iron  include whole grains or cereals, dried fruits, beans, peas, leafy green vegetables, and organ meats (liver, kidney). What side effects may I notice from receiving this medication? Side effects that you should report to your care team as soon as possible: Allergic reactions--skin rash, itching, hives, swelling of the face, lips, tongue, or throat Low blood pressure--dizziness, feeling faint or lightheaded, blurry vision Painful swelling, warmth, or redness of the skin, brown or dark skin color at the infusion site Shortness of breath Side effects that usually do not require medical attention (report these to your care team if they continue or are bothersome): Flushing Headache Joint pain Muscle pain Nausea This list may not describe all possible side effects. Call your doctor for medical advice about side effects. You may report side effects to FDA at 1-800-FDA-1088. Where should I keep my medication? This medication is given in a hospital or clinic. It will not be stored at home. NOTE: This sheet is a summary.  It may not cover all possible information. If you have questions about this medicine, talk to your doctor, pharmacist, or health care provider.  2025 Elsevier/Gold Standard (2024-05-14 00:00:00)

## 2024-08-21 ENCOUNTER — Inpatient Hospital Stay

## 2024-08-22 ENCOUNTER — Ambulatory Visit: Admitting: Family Medicine

## 2024-08-26 ENCOUNTER — Ambulatory Visit: Admitting: Family Medicine

## 2024-09-16 ENCOUNTER — Inpatient Hospital Stay

## 2024-09-16 ENCOUNTER — Inpatient Hospital Stay: Admitting: Family

## 2024-09-24 ENCOUNTER — Ambulatory Visit: Admitting: Internal Medicine

## 2025-06-11 ENCOUNTER — Ambulatory Visit: Admitting: Internal Medicine
